# Patient Record
Sex: Male | Born: 1940 | Race: White | Hispanic: No | Marital: Married | Smoking: Former smoker
Health system: Southern US, Community
[De-identification: ages and names within clinical notes are randomized; demographics above are authoritative.]

## PROBLEM LIST (undated history)

## (undated) DIAGNOSIS — R3912 Poor urinary stream: Secondary | ICD-10-CM

## (undated) DIAGNOSIS — K222 Esophageal obstruction: Secondary | ICD-10-CM

## (undated) DIAGNOSIS — I701 Atherosclerosis of renal artery: Secondary | ICD-10-CM

## (undated) DIAGNOSIS — I517 Cardiomegaly: Secondary | ICD-10-CM

## (undated) DIAGNOSIS — R42 Dizziness and giddiness: Secondary | ICD-10-CM

## (undated) DIAGNOSIS — Y842 Radiological procedure and radiotherapy as the cause of abnormal reaction of the patient, or of later complication, without mention of misadventure at the time of the procedure: Secondary | ICD-10-CM

## (undated) DIAGNOSIS — E785 Hyperlipidemia, unspecified: Secondary | ICD-10-CM

## (undated) DIAGNOSIS — I951 Orthostatic hypotension: Secondary | ICD-10-CM

## (undated) DIAGNOSIS — N320 Bladder-neck obstruction: Secondary | ICD-10-CM

## (undated) DIAGNOSIS — E890 Postprocedural hypothyroidism: Secondary | ICD-10-CM

## (undated) DIAGNOSIS — I440 Atrioventricular block, first degree: Secondary | ICD-10-CM

## (undated) DIAGNOSIS — N400 Enlarged prostate without lower urinary tract symptoms: Secondary | ICD-10-CM

## (undated) DIAGNOSIS — Z87828 Personal history of other (healed) physical injury and trauma: Secondary | ICD-10-CM

## (undated) DIAGNOSIS — K117 Disturbances of salivary secretion: Secondary | ICD-10-CM

## (undated) DIAGNOSIS — G629 Polyneuropathy, unspecified: Secondary | ICD-10-CM

## (undated) DIAGNOSIS — I350 Nonrheumatic aortic (valve) stenosis: Secondary | ICD-10-CM

## (undated) DIAGNOSIS — Z974 Presence of external hearing-aid: Secondary | ICD-10-CM

## (undated) DIAGNOSIS — M1990 Unspecified osteoarthritis, unspecified site: Secondary | ICD-10-CM

## (undated) DIAGNOSIS — I2510 Atherosclerotic heart disease of native coronary artery without angina pectoris: Secondary | ICD-10-CM

## (undated) DIAGNOSIS — I452 Bifascicular block: Secondary | ICD-10-CM

## (undated) DIAGNOSIS — R3911 Hesitancy of micturition: Secondary | ICD-10-CM

## (undated) DIAGNOSIS — Z86718 Personal history of other venous thrombosis and embolism: Secondary | ICD-10-CM

## (undated) DIAGNOSIS — K219 Gastro-esophageal reflux disease without esophagitis: Secondary | ICD-10-CM

## (undated) DIAGNOSIS — R011 Cardiac murmur, unspecified: Secondary | ICD-10-CM

## (undated) DIAGNOSIS — R3915 Urgency of urination: Secondary | ICD-10-CM

## (undated) DIAGNOSIS — G4700 Insomnia, unspecified: Secondary | ICD-10-CM

## (undated) DIAGNOSIS — I499 Cardiac arrhythmia, unspecified: Secondary | ICD-10-CM

## (undated) DIAGNOSIS — Z923 Personal history of irradiation: Secondary | ICD-10-CM

## (undated) DIAGNOSIS — I700 Atherosclerosis of aorta: Secondary | ICD-10-CM

## (undated) DIAGNOSIS — D892 Hypergammaglobulinemia, unspecified: Secondary | ICD-10-CM

## (undated) DIAGNOSIS — N189 Chronic kidney disease, unspecified: Secondary | ICD-10-CM

## (undated) DIAGNOSIS — C029 Malignant neoplasm of tongue, unspecified: Secondary | ICD-10-CM

## (undated) DIAGNOSIS — Z85819 Personal history of malignant neoplasm of unspecified site of lip, oral cavity, and pharynx: Secondary | ICD-10-CM

## (undated) DIAGNOSIS — D696 Thrombocytopenia, unspecified: Secondary | ICD-10-CM

## (undated) HISTORY — DX: Dizziness and giddiness: R42

## (undated) HISTORY — DX: Polyneuropathy, unspecified: G62.9

## (undated) HISTORY — DX: Chronic kidney disease, unspecified: N18.9

## (undated) HISTORY — DX: Cardiac arrhythmia, unspecified: I49.9

## (undated) HISTORY — DX: Cardiomegaly: I51.7

## (undated) HISTORY — DX: Malignant neoplasm of tongue, unspecified: C02.9

## (undated) HISTORY — DX: Insomnia, unspecified: G47.00

## (undated) HISTORY — PX: TRANSTHORACIC ECHOCARDIOGRAM: SHX275

## (undated) HISTORY — PX: MINIMALLY INVASIVE MAZE PROCEDURE: SHX6244

## (undated) HISTORY — DX: Atherosclerosis of aorta: I70.0

## (undated) HISTORY — DX: Hypergammaglobulinemia, unspecified: D89.2

## (undated) HISTORY — DX: Nonrheumatic aortic (valve) stenosis: I35.0

## (undated) HISTORY — PX: CARDIOVASCULAR STRESS TEST: SHX262

## (undated) HISTORY — PX: CARDIAC CATHETERIZATION: SHX172

## (undated) HISTORY — DX: Thrombocytopenia, unspecified: D69.6

## (undated) HISTORY — DX: Hyperlipidemia, unspecified: E78.5

## (undated) HISTORY — DX: Atherosclerosis of renal artery: I70.1

## (undated) HISTORY — DX: Unspecified osteoarthritis, unspecified site: M19.90

## (undated) HISTORY — PX: CORONARY ARTERY BYPASS GRAFT: SHX141

## (undated) HISTORY — DX: Personal history of irradiation: Z92.3

---

## 2003-01-08 ENCOUNTER — Encounter: Payer: Self-pay | Admitting: Emergency Medicine

## 2003-01-08 ENCOUNTER — Inpatient Hospital Stay (HOSPITAL_COMMUNITY): Admission: EM | Admit: 2003-01-08 | Discharge: 2003-01-11 | Payer: Self-pay | Admitting: Emergency Medicine

## 2003-03-24 ENCOUNTER — Ambulatory Visit (HOSPITAL_COMMUNITY): Admission: RE | Admit: 2003-03-24 | Discharge: 2003-03-24 | Payer: Self-pay | Admitting: Cardiology

## 2003-04-12 ENCOUNTER — Ambulatory Visit (HOSPITAL_COMMUNITY): Admission: RE | Admit: 2003-04-12 | Discharge: 2003-04-12 | Payer: Self-pay | Admitting: Neurology

## 2003-04-12 ENCOUNTER — Encounter: Payer: Self-pay | Admitting: Neurology

## 2006-01-26 ENCOUNTER — Inpatient Hospital Stay (HOSPITAL_BASED_OUTPATIENT_CLINIC_OR_DEPARTMENT_OTHER): Admission: RE | Admit: 2006-01-26 | Discharge: 2006-01-26 | Payer: Self-pay | Admitting: Cardiology

## 2007-07-27 ENCOUNTER — Emergency Department (HOSPITAL_COMMUNITY): Admission: EM | Admit: 2007-07-27 | Discharge: 2007-07-27 | Payer: Self-pay | Admitting: Emergency Medicine

## 2010-09-05 ENCOUNTER — Ambulatory Visit: Payer: Self-pay | Admitting: Cardiology

## 2011-01-21 ENCOUNTER — Other Ambulatory Visit: Payer: Self-pay | Admitting: *Deleted

## 2011-01-21 DIAGNOSIS — E7800 Pure hypercholesterolemia, unspecified: Secondary | ICD-10-CM
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
## 2011-02-07 NOTE — Cardiovascular Report (Signed)
   NAME:  KELSON, QUEENAN NO.:  [id number]   MEDICAL RECORD NO.:  [id number]                   PATIENT TYPE:  OIB   LOCATION:  2856                                 FACILITY:  [facility]   PHYSICIAN:  Colleen Can. Deborah Chalk, M.D.            DATE OF BIRTH:  March 30, 1941   DATE OF PROCEDURE:  03/24/2003  DATE OF DISCHARGE:                              CARDIAC CATHETERIZATION   PROCEDURE:  Removal of insertable loop recorder under fluoroscopy.   The patient's left subclavicular area was prepped and draped.  The area was  infiltrated with 1% Xylocaine.  Fluoroscopy was used to locate the loop  recorder which was somewhat lateral.  Incision was made and using sharp,  blunt, and Bovie dissection the loop recorder was identified and removed.  The wound was flushed with kanamycin solution.  The wound was then closed  with 2-0 and subsequent 5-0 Dexon and Steri-Strips were applied.  The  patient tolerated the procedure well.  Valium 10 mg p.o. was given prior to  the procedure as well as 2 mg of Versed during the procedure for sedation.                                                 Colleen Can. Deborah Chalk, M.D.    SNT/MEDQ  D:  03/24/2003  T:  03/24/2003  Job:  [id number]
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
## 2011-02-07 NOTE — Cardiovascular Report (Signed)
NAME:  George Bradley, George Bradley NO.:  [id number]   MEDICAL RECORD NO.:  [id number]          PATIENT TYPE:  OIB   LOCATION:  1961                         FACILITY:  [facility]   PHYSICIAN:  Colleen Can. Deborah Chalk, M.D.DATE OF BIRTH:  23-May-1941   DATE OF PROCEDURE:  01/26/2006  DATE OF DISCHARGE:  01/26/2006                              CARDIAC CATHETERIZATION   HISTORY:  Mr. Brindle has had previous coronary artery bypass surgery in 2000.  He has had arrhythmia and hypotension.  He is referred for evaluation  because of weakness, fatigue and an abnormal stress Cardiolite study which  suggested a decreased global ejection fraction.   PROCEDURE:  Left heart catheterization, selective coronary angiography,  saphenous vein graft angiography x3, angiography left internal mammary  artery.   TYPE AND SITE OF ENTRY:  Percutaneous right femoral artery.   CATHETERS:  4-French #4 curved Judkins right and left coronary catheter, 4-  French pigtail ventriculographic catheter, 4-French left bypass graft  catheter.   CONTRAST MATERIAL:  Omnipaque.   MEDICATIONS:  Given prior procedure, Valium 10 mg p.o.   MEDICATIONS:  Given during the procedure, Versed 2 mg IV.   COMMENTS:  The patient tolerated the procedure well.   HEMODYNAMIC DATA:  The aortic pressure is 106/56, LV is 124/1-8.  There is  no aortic valve gradient noted on pullback.   ANGIOGRAPHIC DATA:  1.  Left left main coronary artery:  Left main coronary artery has a slight      10-20% taper.  Otherwise it is free of significant disease.  2.  Left anterior descending:  Left anterior descending is totally occluded      at the ostium.  There is very small diagonal branch that arises at that      point as well as a larger diagonal vessel.  The earlier diagonal vessel      has a 90% ostial narrowing.  There is competitive flow distally in this      diagonal from the patent vein graft.  3.  Left circumflex:  Left circumflex is a  moderate-sized vessel.  It is      heavily calcified.  In the midsection, there is a 90+ percent stenosis      and somewhat diffuse disease within the confines of the more proximal      portions of the left circumflex.  There is competitive flow distally      from the patent distal bypass graft insertion.  4.  Right coronary artery:  Right coronary artery is totally occluded just      beyond acute marginal branch.  There is a 90-95% short segmental      stenosis in the proximal portions.  This acute marginal vessel that      arises is a small vessel.  This has an area of potential ischemia but it      is a relatively small acute marginal branch.  5.  Saphenous vein graft to the right coronary artery is widely patent with      a nice insertion and good distal runoff.  6.  Saphenous vein graft to the diagonal vessel is widely patent with a nice      insertion and good distal runoff.  7.  Saphenous vein graft to the obtuse marginal is widely patent with a nice      insertion and good distal runoff.  8.  Left internal mammary graft to the left anterior descending is widely      patent with a nice insertion and good distal runoff.   Left ventricular angiogram was performed in RAO position.  Overall cardiac  size and silhouette are normal.  There is very minimal anterior hypokinesis.  The global ejection fraction would be estimated be 55%.  There was 1+ mitral  regurgitation noted.   OVERALL IMPRESSION:  1.  Severe three-vessel coronary disease.  2.  Patent saphenous vein grafts x3 with patent left internal mammary graft.  3.  Reasonably well-preserved left ventricular function with mild anterior      hypokinesis.   DISCUSSION:  Overall, it is felt that Mr. Latner is relatively stable.  It is  felt that his heart is satisfactorily revascularized at this point and time.  There is the slight issue with the proximal right coronary artery and an  acute marginal vessel but it was felt that is  probably best treated  medically.  We will get a 2-D echocardiogram to assess an underlying mitral  regurgitation to look for possible other etiologies of symptoms.  Overall  prognosis should be good.      Colleen Can. Deborah Chalk, M.D.  Electronically Signed     SNT/MEDQ  D:  01/26/2006  T:  01/26/2006  Job:  [id number]
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
## 2011-02-20 ENCOUNTER — Ambulatory Visit (INDEPENDENT_AMBULATORY_CARE_PROVIDER_SITE_OTHER): Payer: Medicare Other | Admitting: Cardiology

## 2011-02-20 ENCOUNTER — Encounter: Payer: Self-pay | Admitting: *Deleted

## 2011-02-20 ENCOUNTER — Other Ambulatory Visit (INDEPENDENT_AMBULATORY_CARE_PROVIDER_SITE_OTHER): Payer: Medicare Other | Admitting: *Deleted

## 2011-02-20 ENCOUNTER — Encounter: Payer: Self-pay | Admitting: Cardiology

## 2011-02-20 VITALS — BP 122/74 | HR 60 | Ht 73.0 in | Wt 193.4 lb

## 2011-02-20 DIAGNOSIS — I2510 Atherosclerotic heart disease of native coronary artery without angina pectoris: Secondary | ICD-10-CM

## 2011-02-20 DIAGNOSIS — E7800 Pure hypercholesterolemia, unspecified: Secondary | ICD-10-CM

## 2011-02-20 DIAGNOSIS — I259 Chronic ischemic heart disease, unspecified: Secondary | ICD-10-CM

## 2011-02-20 DIAGNOSIS — I209 Angina pectoris, unspecified: Secondary | ICD-10-CM | POA: Insufficient documentation

## 2011-02-20 DIAGNOSIS — I359 Nonrheumatic aortic valve disorder, unspecified: Secondary | ICD-10-CM

## 2011-02-20 DIAGNOSIS — I350 Nonrheumatic aortic (valve) stenosis: Secondary | ICD-10-CM | POA: Insufficient documentation

## 2011-02-20 LAB — BASIC METABOLIC PANEL
BUN: 15 mg/dL (ref 6–23)
CO2: 23 mEq/L (ref 19–32)
Calcium: 9.5 mg/dL (ref 8.4–10.5)
Creatinine, Ser: 1.2 mg/dL (ref 0.4–1.5)
GFR: 63.58 mL/min (ref >60.00)
Glucose, Bld: 101 mg/dL — ABNORMAL HIGH (ref 70–99)
Potassium: 4.4 mEq/L (ref 3.5–5.1)

## 2011-02-20 LAB — HEPATIC FUNCTION PANEL
AST: 26 U/L (ref 0–37)
Total Bilirubin: 0.7 mg/dL (ref 0.3–1.2)

## 2011-02-20 LAB — LIPID PANEL
LDL Cholesterol: 89 mg/dL (ref 0–99)
Total CHOL/HDL Ratio: 3
Triglycerides: 101 mg/dL (ref 0.0–149.0)

## 2011-02-20 NOTE — Assessment & Plan Note (Signed)
Will arrange for him to have a stress Cardiolite study.

## 2011-02-20 NOTE — Assessment & Plan Note (Signed)
His aortic stenosis is mild and his exam today is consistent with nonsignificant aortic stenosis.

## 2011-02-20 NOTE — Progress Notes (Signed)
Subjective:   Mr. Abshier comes in today for followup visit. His last stress Cardiolite study was in June of 2010 and we will repeat after this visit. Overall, he still complains of reduced energy level is able to stay quite active around his house. He does report some intermittent tingling of his lower extremities simultaneously and I suggested that he consider evaluation for spinal stenosis by primary care. Overall, he is doing well.  He had coronary artery bypass grafting in 2000. His last catheterization was in 2007 and showed patent saphenous vein grafts x3 with a patent left internal mammary artery and well-preserved LV function. His EF is 55%.  He a tendency toward hypotension and some lightheadedness on occasion. He is not on an ACE inhibitor or beta blocker. He's had a history of what sounds like a Maze procedure when he lived in [place]. His other problems include hyperlipidemia, insomnia, and a history of depression.  Current Outpatient Prescriptions  Medication Sig Dispense Refill  . aspirin 81 MG tablet Take 81 mg by mouth daily.        . rosuvastatin (CRESTOR) 20 MG tablet Take 20 mg by mouth daily.        Marland Kitchen zolpidem (AMBIEN CR) 6.25 MG CR tablet Take 6.25 mg by mouth at bedtime as needed.        Marland Kitchen DISCONTD: ezetimibe (ZETIA) 10 MG tablet Take 10 mg by mouth daily.          No Known Allergies  There is no problem list on file for this patient.   History  Smoking status  . Never Smoker   Smokeless tobacco  . Never Used    History  Alcohol Use No    Family History  Problem Relation Age of Onset  . Heart disease Father   . Heart disease Brother   . Heart disease Sister     Review of Systems:   The patient denies any heat or cold intolerance.  No weight gain or weight loss.  The patient denies headaches or blurry vision.  There is no cough or sputum production.  The patient denies dizziness.  There is no hematuria or hematochezia.  The patient denies any muscle  aches or arthritis.  The patient denies any rash.  The patient denies frequent falling or instability.  There is no history of depression or anxiety.  All other systems were reviewed and are negative.   Physical Exam:    Blood pressure 120/74. Heart rate 60 and regular. Weight is 193 pounds.  The head is normocephalic and atraumatic.  Pupils are equally round and reactive to light.  Sclerae nonicteric.  Conjunctiva is clear.  Oropharynx is unremarkable.  There's adequate oral airway.  Neck is supple there are no masses.  Thyroid is not enlarged.  There is no lymphadenopathy.  Lungs are clear.  Chest is symmetric.  Heart shows a regular rate and rhythm.  S1 and S2 are normal.  There is a soft systolic outflow murmur.  Abdomen is soft normal bowel sounds.  There is no organomegaly.  Genital and rectal deferred.  Extremities are without edema.  Peripheral pulses are adequate.  Neurologically intact.he is hyperreflexic in the lower extremities has adequate pedal pulses.  Full range of motion.  The patient is not depressed.  Skin is warm and dry.  Assessment / Plan:

## 2011-02-20 NOTE — Assessment & Plan Note (Signed)
We'll check lab work. We'll continue Crestor 20 mg per day.

## 2011-02-21 ENCOUNTER — Telehealth: Payer: Self-pay | Admitting: *Deleted

## 2011-02-21 NOTE — Telephone Encounter (Signed)
Message copied by Adolphus Birchwood on Fri Feb 21, 2011 11:43 AM ------      Message from: Roger Shelter      Created: Thu Feb 20, 2011  1:50 PM       Ok, recheck in six months.

## 2011-02-21 NOTE — Telephone Encounter (Signed)
Pt notified of lab results and informed to have labs in six months.

## 2011-03-03 ENCOUNTER — Ambulatory Visit (HOSPITAL_COMMUNITY): Payer: Medicare Other | Attending: Cardiology | Admitting: Radiology

## 2011-03-03 VITALS — Ht 73.0 in | Wt 191.0 lb

## 2011-03-03 DIAGNOSIS — I4949 Other premature depolarization: Secondary | ICD-10-CM

## 2011-03-03 DIAGNOSIS — I2510 Atherosclerotic heart disease of native coronary artery without angina pectoris: Secondary | ICD-10-CM | POA: Insufficient documentation

## 2011-03-03 DIAGNOSIS — R0989 Other specified symptoms and signs involving the circulatory and respiratory systems: Secondary | ICD-10-CM

## 2011-03-03 DIAGNOSIS — I25810 Atherosclerosis of coronary artery bypass graft(s) without angina pectoris: Secondary | ICD-10-CM

## 2011-03-03 MED ORDER — TECHNETIUM TC 99M TETROFOSMIN IV KIT
33.0000 | PACK | Freq: Once | INTRAVENOUS | Status: AC | PRN
  Administered 2011-03-03: 33 via INTRAVENOUS

## 2011-03-03 MED ORDER — TECHNETIUM TC 99M TETROFOSMIN IV KIT
11.0000 | PACK | Freq: Once | INTRAVENOUS | Status: AC | PRN
  Administered 2011-03-03: 11 via INTRAVENOUS

## 2011-03-03 NOTE — Progress Notes (Signed)
[facility] SITE 3 NUCLEAR MED [address] [place] [address] [phone]  Cardiology Nuclear Med Study  ZADEN SAKO is a 70 y.o. male [id number] 10/20/1940   Nuclear Med Background Indication for Stress Test:  Evaluation for Ischemia and Graft Patency History:  '00 CABG, '07 Cath:patient grafts, '10 Echo:normal LVF, mild AS, '10 OZH:YQMVHQIO scar, no ischemia, EF=57% Cardiac Risk Factors: Family History - CAD, Lipids and RBBB  Symptoms:  DOE, Fatigue with Exertion, Light-Headedness and Rapid HR   Nuclear Pre-Procedure Caffeine/Decaff Intake:  None NPO After: 7:00pm   Lungs:  Clear. IV 0.9% NS with Angio Cath:  18g  IV Site: R Antecubital  IV Started by:  Stanton Kidney, EMT-P  Chest Size (in):  44 Cup Size: n/a  Height: 6\' 1"  (1.854 m)  Weight:  191 lb (86.637 kg)  BMI:  Body mass index is 25.20 kg/(m^2). Tech Comments:  NA    Nuclear Med Study 1 or 2 day study: 1 day  Stress Test Type:  Stress  Reading MD: Charlton Haws, MD  Order Authorizing Provider:  Roger Shelter, MD  Resting Radionuclide: Technetium 56m Tetrofosmin  Resting Radionuclide Dose: 11 mCi   Stress Radionuclide:  Technetium 13m Tetrofosmin  Stress Radionuclide Dose: 33 mCi           Stress Protocol Rest HR: 45 Stress HR: 151  Rest BP: 107/72 Stress BP: 169/70  Exercise Time (min): 11:46 METS: 13.4   Predicted Max HR: 150 bpm % Max HR: 100.67 bpm Rate Pressure Product: 96295   Dose of Adenosine (mg):  n/a Dose of Lexiscan: n/a mg  Dose of Atropine (mg): n/a Dose of Dobutamine: n/a mcg/kg/min (at max HR)  Stress Test Technologist: Smiley Houseman, CMA-N  Nuclear Technologist:  Doyne Keel, CNMT     Rest Procedure:  Myocardial perfusion imaging was performed at rest 45 minutes following the intravenous administration of Technetium 53m Tetrofosmin.  Rest ECG: PVC's, RBBB, LAFB  Stress Procedure:  The patient exercised for 11:46 on the treadmill utilizing the Bruce protocol.   The patient stopped due to fatigue and denied any chest pain.  There were no significant ST-T wave changes.  He did have frequent PVC's with couplets and runs of nonsustained v-tach at peak exercise.  Technetium 28m Tetrofosmin was injected at peak exercise and myocardial perfusion imaging was performed after a brief delay.  Stress ECG: No significant change from baseline ECG  QPS Raw Data Images:  Normal; no motion artifact; normal heart/lung ratio. Stress Images:  There is decreased uptake in the anterior wall. Rest Images:  There is decreased uptake in the anterior wall. Subtraction (SDS):  There is a fixed defect that is most consistent with a previous infarction. Transient Ischemic Dilatation (Normal <1.22):  .95 Lung/Heart Ratio (Normal <0.45):  .25  Quantitative Gated Spect Images QGS EDV:  123 ml QGS ESV:  56 ml QGS cine images:  EF in normal range but apex appears hypokinetic QGS EF: 55%  Impression Exercise Capacity:  Excellent exercise capacity. BP Response:  Normal blood pressure response. Clinical Symptoms:  There is dyspnea. ECG Impression:  No significant ST segment change suggestive of ischemia. 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  RV appears prominant     Charlton Haws

## 2011-03-04 NOTE — Progress Notes (Signed)
Copy routed to Dr. Tennant.Falecha L Clark °  °

## 2011-03-07 ENCOUNTER — Telehealth: Payer: Self-pay | Admitting: *Deleted

## 2011-03-07 NOTE — Progress Notes (Signed)
No ischemia

## 2011-03-07 NOTE — Telephone Encounter (Signed)
Pt notified of nuclear results.   

## 2011-03-17 ENCOUNTER — Other Ambulatory Visit: Payer: Self-pay | Admitting: Internal Medicine

## 2011-03-17 DIAGNOSIS — R599 Enlarged lymph nodes, unspecified: Secondary | ICD-10-CM

## 2011-03-20 ENCOUNTER — Ambulatory Visit
Admission: RE | Admit: 2011-03-20 | Discharge: 2011-03-20 | Disposition: A | Discharge status: Home / Self Care | Payer: Medicare Other | Source: Ambulatory Visit | Attending: Internal Medicine | Admitting: Internal Medicine

## 2011-03-20 DIAGNOSIS — R599 Enlarged lymph nodes, unspecified: Secondary | ICD-10-CM

## 2011-03-20 MED ORDER — IOHEXOL 300 MG/ML  SOLN
75.0000 mL | Freq: Once | INTRAMUSCULAR | Status: AC | PRN
  Administered 2011-03-20: 75 mL via INTRAVENOUS

## 2011-03-24 ENCOUNTER — Other Ambulatory Visit (HOSPITAL_COMMUNITY)
Admission: RE | Admit: 2011-03-24 | Discharge: 2011-03-24 | Disposition: A | Discharge status: Home / Self Care | Payer: Medicare Other | Source: Ambulatory Visit | Attending: Otolaryngology | Admitting: Otolaryngology

## 2011-03-24 ENCOUNTER — Other Ambulatory Visit: Payer: Self-pay | Admitting: Otolaryngology

## 2011-03-24 DIAGNOSIS — R221 Localized swelling, mass and lump, neck: Secondary | ICD-10-CM | POA: Insufficient documentation

## 2011-03-24 DIAGNOSIS — R220 Localized swelling, mass and lump, head: Secondary | ICD-10-CM | POA: Insufficient documentation

## 2011-04-03 ENCOUNTER — Other Ambulatory Visit (HOSPITAL_COMMUNITY): Payer: Self-pay | Admitting: Otolaryngology

## 2011-04-03 DIAGNOSIS — IMO0002 Reserved for concepts with insufficient information to code with codable children: Secondary | ICD-10-CM

## 2011-04-07 ENCOUNTER — Encounter (HOSPITAL_COMMUNITY): Payer: Self-pay

## 2011-04-07 ENCOUNTER — Encounter (HOSPITAL_COMMUNITY)
Admission: RE | Admit: 2011-04-07 | Discharge: 2011-04-07 | Disposition: A | Discharge status: Home / Self Care | Payer: Medicare Other | Source: Ambulatory Visit | Attending: Otolaryngology | Admitting: Otolaryngology

## 2011-04-07 DIAGNOSIS — R220 Localized swelling, mass and lump, head: Secondary | ICD-10-CM | POA: Insufficient documentation

## 2011-04-07 DIAGNOSIS — C770 Secondary and unspecified malignant neoplasm of lymph nodes of head, face and neck: Secondary | ICD-10-CM | POA: Insufficient documentation

## 2011-04-07 DIAGNOSIS — IMO0002 Reserved for concepts with insufficient information to code with codable children: Secondary | ICD-10-CM

## 2011-04-07 DIAGNOSIS — C801 Malignant (primary) neoplasm, unspecified: Secondary | ICD-10-CM | POA: Insufficient documentation

## 2011-04-07 LAB — GLUCOSE, CAPILLARY: Glucose-Capillary: 109 mg/dL — ABNORMAL HIGH (ref 70–99)

## 2011-04-07 MED ORDER — FLUDEOXYGLUCOSE F - 18 (FDG) INJECTION: 19.0000 | Freq: Once | INTRAVENOUS | Status: AC | PRN

## 2011-04-15 ENCOUNTER — Encounter (HOSPITAL_COMMUNITY): Payer: Medicare Other | Attending: Oncology | Admitting: Oncology

## 2011-04-15 ENCOUNTER — Encounter (HOSPITAL_COMMUNITY): Payer: Self-pay | Admitting: Oncology

## 2011-04-15 VITALS — BP 104/68 | HR 61 | Temp 97.7°F | Ht 72.5 in | Wt 197.4 lb

## 2011-04-15 DIAGNOSIS — C109 Malignant neoplasm of oropharynx, unspecified: Secondary | ICD-10-CM | POA: Insufficient documentation

## 2011-04-15 DIAGNOSIS — C50919 Malignant neoplasm of unspecified site of unspecified female breast: Secondary | ICD-10-CM

## 2011-04-15 DIAGNOSIS — C01 Malignant neoplasm of base of tongue: Secondary | ICD-10-CM

## 2011-04-15 DIAGNOSIS — C770 Secondary and unspecified malignant neoplasm of lymph nodes of head, face and neck: Secondary | ICD-10-CM | POA: Insufficient documentation

## 2011-04-15 DIAGNOSIS — R978 Other abnormal tumor markers: Secondary | ICD-10-CM | POA: Insufficient documentation

## 2011-04-15 DIAGNOSIS — C029 Malignant neoplasm of tongue, unspecified: Secondary | ICD-10-CM

## 2011-04-15 LAB — CBC
Hemoglobin: 15.8 g/dL (ref 13.0–17.0)
MCHC: 34.6 g/dL (ref 30.0–36.0)
RBC: 4.91 MIL/uL (ref 4.22–5.81)

## 2011-04-15 LAB — COMPREHENSIVE METABOLIC PANEL
ALT: 18 U/L (ref 0–53)
Albumin: 4.4 g/dL (ref 3.5–5.2)
Alkaline Phosphatase: 36 U/L — ABNORMAL LOW (ref 39–117)
BUN: 19 mg/dL (ref 6–23)
Chloride: 105 mEq/L (ref 96–112)
Potassium: 4.8 mEq/L (ref 3.5–5.1)
Total Bilirubin: 0.2 mg/dL — ABNORMAL LOW (ref 0.3–1.2)

## 2011-04-15 LAB — DIFFERENTIAL
Basophils Relative: 0 % (ref 0–1)
Lymphs Abs: 3.1 10*3/uL (ref 0.7–4.0)
Monocytes Relative: 11 % (ref 3–12)
Neutro Abs: 4.1 10*3/uL (ref 1.7–7.7)
Neutrophils Relative %: 49 % (ref 43–77)

## 2011-04-15 NOTE — Progress Notes (Signed)
This office note has been dictated.

## 2011-04-15 NOTE — Patient Instructions (Addendum)
[facility]  Discharge Instructions   SPECIAL INSTRUCTIONS/FOLLOW-UP: You will be referred to Dr. Karilyn Cota (GI doctor) and to Dr. Kristin Bruins (Oncologic Dentistry).  We will send the referral for you and they will call you to schedule an appointment.  Labs drawn today:  CBC, CMET, CEA.       I acknowledge that I have been informed and understand all the instructions given to me and received a copy. I do not have any more questions at this time, but understand that I may call the Specialty Clinic at [facility] at [phone] during business hours should I have any further questions or need assistance in obtaining follow-up care.    __________________________________________  _____________  __________ Signature of Patient or Authorized Representative            Date                   Time    __________________________________________ Nurse's Signature

## 2011-04-15 NOTE — Progress Notes (Signed)
CC:   Gwen Pounds, MD Lionel December, M.D. Suzanna Obey, M.D. Cindra Eves, D.D.S. Billie Lade, Ph.D., M.D.  DIAGNOSIS: 1. 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  His PET scan does not     reveal distant metastatic disease. 2. History of smoking cigars perhaps once a day for 12 years. 3. History of drinking a glass of wine perhaps once a week. 4. History of right hand trauma as a young child with repair. 5. 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 6. History of hypercholesterolemia on Crestor. 7. History of quadruple bypass surgery in [place], [place] in the year     2000. 8. 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 basis.  This is a very pleasant gentleman who about 3-4 months ago noticed a lump in his right neck which has progressively grown.  He noticed it first while shaving, but interestingly about 3 months prior to that, he noticed a decrease in his energy level.  His strength was dwindling.  He was not able to run his 5 miles in an hour.  He states that just 1 day he could not run more than 15 minutes and he usually ran an hour.  His appetite is still good.  He has not lost weight.  No fevers or night sweats, etc.  REVIEW OF SYSTEMS:  He states that the left side of the tongue is starting to become a little sore but not the right.  So, he was referred by Dr. Timothy Lasso after showing this mass in the right neck to Dr. Timothy Lasso to Dr. Suzanna Obey who did not find a primary in the head and neck examination that he performed.  An aspiration of this, however, revealed the above-mentioned diagnosis, and that was done on 03/24/2011.  Dr. Frederica Kuster read this material.  PAST MEDICAL HISTORY:  As mentioned above.  SOCIAL HISTORY:  He has been  married to his wife, [name], for 14 years. He has 2 children by a previous marriage, a son and a daughter.  He states his son has hypercholesterolemia as well.  His parents are both deceased.  Father died in his late 55s of complications of vascular disease.  His mother lived to be approximately 47 and died of breast cancer, he states.  He has a sister who died at age 88 from breast cancer.  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  PHYSICAL EXAMINATION:  Vital Signs:  Shows that his height is 6 feet 1/2 inch.  His weight is 197 pounds.  His temperature is normal, respirations 16 and unlabored, pulse 60 and regular, blood pressure 104/68 in the right arm sitting position.  He denies any pain at this time.  Lymph Node Exam:  Reveals a 4 x 3 cm mass in the right neck below the jaw in the upper mid neck.  It is very firm and very indurated. There is a smaller node that is 1 cm that is soft but more posteriorly located close to the occipital area.  I did not detect  any nodes in the supraclavicular, infraclavicular, left side of the neck, axillary areas bilaterally or inguinal areas bilaterally.  Abdomen:  He has no hepatosplenomegaly.  Bowel sounds are normal.  Heart:  He has a midline scar on his chest with keloid formation, but his heart shows a very soft S1 and S2 without murmur, rub or gallop.  Lungs:  Clear to auscultation and percussion.  Extremities:  His right hand shows a scar at the base of the thumb where he cut it many years ago and it was repaired, and the Dupuytren contractures were mentioned.  He has no arm or leg edema. Nails are unremarkable.  HEENT:  His teeth are in good repair.  Throat is clear.  Tongue is normal in the midline.  I did not appreciate a lesion.  His pupils are equally round and reactive to light.  Facial symmetry appears intact.  His neck  symmetry, however, is very different with a mass on the right which is very obvious to the eye.  His pulses in his feet are 1+ to 2+ and symmetrical.  He is right-handed.  Strength is symmetrical.  This gentleman appears to have stage III cancer of the head and neck which on PET scan looks like it originated at the base of the tongue on the right.  That has not been biopsied, but I have discussed his case with Dr. Roselind Messier who will see him in consultation in the very near future.  He may ask for a biopsy of the base of the tongue.  He is to see Dr. Jearld Fenton in followup anyway this Friday.  We need to get him to see Dr. Dian Queen.  We need to get him to see Dr. Karilyn Cota about a G-tube during his therapy which should consist of radiation and concomitant chemotherapy.  He will get some baseline labs today, namely CBC, diff, CMET and CEA.  We will see him in followup.  We will probably get Interventional Radiology to place a Port-A-Cath for the weekly cisplatin which I have discussed with him and his wife in detail.  We will see him back once we get all these consultations and we get a date for the initiation of radiation therapy, etc.  We plan to give him cisplatin weekly at 40 mg/sq m for the duration of the radiation.    ______________________________ Ladona Horns. Mariel Sleet, MD ESN/MEDQ  D:  04/15/2011  T:  04/15/2011  Job:  [id number]

## 2011-04-16 ENCOUNTER — Other Ambulatory Visit (HOSPITAL_COMMUNITY): Payer: Self-pay | Admitting: Oncology

## 2011-04-16 LAB — CEA: CEA: 63.3 ng/mL — ABNORMAL HIGH (ref 0.0–5.0)

## 2011-04-17 ENCOUNTER — Encounter (INDEPENDENT_AMBULATORY_CARE_PROVIDER_SITE_OTHER): Payer: Self-pay

## 2011-04-17 ENCOUNTER — Other Ambulatory Visit (HOSPITAL_COMMUNITY): Payer: Medicare Other | Admitting: Dentistry

## 2011-04-17 DIAGNOSIS — I2510 Atherosclerotic heart disease of native coronary artery without angina pectoris: Secondary | ICD-10-CM

## 2011-04-17 DIAGNOSIS — C760 Malignant neoplasm of head, face and neck: Secondary | ICD-10-CM

## 2011-04-17 DIAGNOSIS — Z0189 Encounter for other specified special examinations: Secondary | ICD-10-CM

## 2011-04-18 ENCOUNTER — Other Ambulatory Visit (HOSPITAL_COMMUNITY): Payer: Self-pay | Admitting: Oncology

## 2011-04-18 DIAGNOSIS — C029 Malignant neoplasm of tongue, unspecified: Secondary | ICD-10-CM

## 2011-04-22 ENCOUNTER — Encounter (INDEPENDENT_AMBULATORY_CARE_PROVIDER_SITE_OTHER): Payer: Self-pay | Admitting: Internal Medicine

## 2011-04-22 ENCOUNTER — Ambulatory Visit (INDEPENDENT_AMBULATORY_CARE_PROVIDER_SITE_OTHER): Payer: Medicare Other | Admitting: Internal Medicine

## 2011-04-22 VITALS — BP 122/64 | HR 72 | Temp 97.5°F | Ht 72.0 in | Wt 196.8 lb

## 2011-04-22 DIAGNOSIS — C029 Malignant neoplasm of tongue, unspecified: Secondary | ICD-10-CM

## 2011-04-22 NOTE — Progress Notes (Signed)
EGD w/ peg placement sch'd 04/24/11 @ 8:30 (7:30), inst given to patient

## 2011-04-22 NOTE — Progress Notes (Signed)
Subjective:     Patient ID: George Bradley, male   DOB: 1941/05/12, 70 y.o.   MRN: [id number]  HPI  Referral form Dr. Mariel Sleet for evaluation of placement of feeding tube. He has hx of tongue cancer and will be starting radiation therapy hopefully the beginning of next week. Hx Stage 3 Squamous cell carcinoma of the base of the tongue metastatic to rt cervical lymph nodes. His PET scan did not reveal distant metastatic disease.  Per Dr. Mariel Sleet dictated notes Mr. Pewitt noticed a lump to the rt side of his neck 3-4 months ago which had progressively grown. He noticed fatigue and decreased energy. Wife is in room. Appetite is good. No weight loss. No pain or tongue pain. No dysphagia.  He has positive attitude.   03/18/2011 Labs: WBFC 5.70, H and H 16.5 an d47.3, MCV 97.3, NA 141 , K 4.5, Choride 106, Calcium 9.6,  Review of Systems  See hpi     Objective:   Physical Exam  Alert and oriented. Skin warm and dry. Oral mucosa is moist. Natural teeth in good condition. Sclera anicteric, conjunctivae is pink. Thyroid not enlarged. + Cervical lymphadenopathy rt side of neck. Lungs clear. Heart regular rate and rhythm.  Abdomen is soft. Bowel sounds are positive. No hepatomegaly. No abdominal masses felt. No tenderness.  No edema to lower extremities. Patient is alert and oriented.      Assessment:   Recent hx of tongue cancer. Patient will be starting radiation therapy in the next few weeks.  Will need PEG tube placement    Plan:     Patient is scheduled for a PEG Tube for Thursday.  Dr. Karilyn Cota was also in the room during discharge.

## 2011-04-23 ENCOUNTER — Ambulatory Visit
Admission: RE | Admit: 2011-04-23 | Discharge: 2011-04-23 | Disposition: A | Discharge status: Home / Self Care | Payer: Medicare Other | Source: Ambulatory Visit | Attending: Radiation Oncology | Admitting: Radiation Oncology

## 2011-04-23 DIAGNOSIS — M25519 Pain in unspecified shoulder: Secondary | ICD-10-CM | POA: Insufficient documentation

## 2011-04-23 DIAGNOSIS — C01 Malignant neoplasm of base of tongue: Secondary | ICD-10-CM | POA: Insufficient documentation

## 2011-04-23 DIAGNOSIS — I2510 Atherosclerotic heart disease of native coronary artery without angina pectoris: Secondary | ICD-10-CM | POA: Insufficient documentation

## 2011-04-23 DIAGNOSIS — Z803 Family history of malignant neoplasm of breast: Secondary | ICD-10-CM | POA: Insufficient documentation

## 2011-04-23 DIAGNOSIS — Z510 Encounter for antineoplastic radiation therapy: Secondary | ICD-10-CM | POA: Insufficient documentation

## 2011-04-23 DIAGNOSIS — Z951 Presence of aortocoronary bypass graft: Secondary | ICD-10-CM | POA: Insufficient documentation

## 2011-04-23 MED ORDER — SODIUM CHLORIDE 0.45 % IV SOLN
Freq: Once | INTRAVENOUS | Status: AC
  Administered 2011-04-24: 08:00:00 via INTRAVENOUS

## 2011-04-23 NOTE — Progress Notes (Signed)
CC:   George Bradley. Mariel Sleet, MD Suzanna Obey, M.D. Billie Lade, Ph.D., M.D. Denton Lank, DDS  Guy Sandifer  Widen is a 70 year old male referred by Dr. Glenford Peers for a dental consultation.  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  The patient is now seen as part of pre-chemoradiation therapy dental protocol evaluation.  MEDICAL HISTORY: 1. 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     a.     Status post fine-needle aspirate of the right neck mass      which was positive for squamous cell carcinoma.  This was      performed by Dr. Suzanna Obey on 03/24/2011.  The patient      subsequently had a PET scan on 04/07/2011 which revealed probable      right base of tongue primary.  No definitive biopsy has been taken      at this time.     b.     Anticipated radiation therapy with Dr. Roselind Messier.     c.     Anticipated chemotherapy with Dr. Mariel Sleet utilizing cis-      platinum on a weekly basis. 2. Coronary artery disease.     a.     Status post coronary artery bypass graft x4 in [place], [place]      in May 2000.     b.     Patient currently followed by Dr. Delfin Edis who has      recently retired.     c.     Status post cardiac catheterization in May 2007 which      revealed three-vessel coronary artery disease with patent      saphenous vein graft x3 and a patent left internal mammary graft      as well.  Patient with preserved left ventricular function with      ejection fraction approximately 55%.  The patient had been      undergoing active medical treatment after that catheterization. 3. History of a hypercholesterolemia currently on Crestor. 4. 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 5. 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 6. History of cardiac  arrhythmias status post multiple ablation     therapies.  ALLERGIES:  None known.  MEDICATIONS: 1. Aspirin 81 mg daily. 2. Crestor 20 mg daily. 3. Celebrex 200 mg daily. 4. Ambien 10 mg 1/2 tablet at bedtime. 5. Non-aspirin PM 1 at bedtime.  SOCIAL HISTORY:  Patient is currently married to his wife [name] for the past 14 years.  The patient has 2 children with his previous wife. The patient has 1 son and 1 daughter.  The patient is currently retired. The patient occasionally smokes a cigar but very rarely.  The patient has an occasional glass of wine.  FAMILY HISTORY:  Mother died at the age of 72 with a history of breast cancer and Alzheimer's disease.  Father died in his late 59s secondary to cardiovascular disease.  FUNCTIONAL ASSESSMENT:  The patient remains independent for ADLs at this time.  REVIEW OF SYSTEMS:  This is reviewed with the patient and is included in his dental consultation record.  DENTAL HISTORY   CHIEF COMPLAINT:  The patient needs a pre-chemoradiation therapy dental evaluation.  HISTORY OF PRESENT ILLNESS:  Patient  recently diagnosed with cancer involving the right neck mass.  Patient with probable squamous cell carcinoma of the right base of tongue.  Patient with anticipated chemoradiation therapy.  Patient now seen as part of pre-chemoradiation therapy dental protocol evaluation.  The patient currently denies acute toothache, swellings or abscesses. The patient was last seen for an exam and cleaning in February 2012 by Dr. Lamont Dowdy in [place], [place].  The patient is seen on an every 19-month basis and has been seen over the past 8+ years.  The patient had been being evaluated for possible crown therapies over the past 8 years.  DENTAL EXAM  General:  The patient is a well-developed, well-nourished male in no acute distress. Vital Signs:  Blood pressure is 100/62, pulse rate is 52, temperature is 97.1.  Head and Neck Exam:   Patient with significant right neck lymphadenopathy.  I am unable to palpate any left neck lymphadenopathy. The patient denies acute TMJ symptoms. Intraoral exam:  The patient has normal saliva.  I do not see any evidence of abscess formation within the mouth.  Dentition:  The patient is missing tooth numbers 1, 3, 16, 17, 18, 19 and 32.  The patient has a bridge replacing the missing tooth #3. Periodontal:  Patient with chronic periodontitis with minimal plaque or calculus accumulations, selective areas of gingival recession and no significant tooth mobility.  Patient with incipient bone loss at best. Dental Caries:  The patient has incipient dental caries associated with tooth #30 at the gingival margin.  The patient does have several flexure lesions. Endodontic:  The patient currently denies acute pulpitis symptoms.  I do not see any obvious evidence of periapical pathology, although there may be a hint of increased periodontal ligament space at the apex of tooth #4 on one of the periapical views.  The patient currently denies acute pulpitis symptoms, however. Crown or Bridge:  The patient has multiple crown and bridge restorations which appear to be acceptable at this time.  The patient could be evaluated for future crown restorations as indicated. Prosthodontic:  Patient without a history of a lower partial denture. Occlusion:  Patient with a stable occlusion and an acceptable occlusal scheme.  ASSESSMENT: 1. Chronic periodontitis with minimal bone loss. 2. Minimal plaque accumulations. 3. Selective areas of gingival recession. 4. No significant tooth mobility. 5. Multiple missing teeth. 6. Stable occlusion. 7. Small incipient caries at the gingival margin on the buccal aspect     of tooth #30. 8. Slight hint of an increased periodontal ligament space associated     with the apex of tooth #4 to be followed for possible need for root     canal therapy as  indicated.     PLAN/RECOMMENDATION: 1. I discussed the risks, benefits, and complications of various     treatment options with the patient in relationship to his medical     and dental conditions and anticipated chemoradiation therapy,     chemoradiation therapy side effects to include xerostomia,     radiation caries, trismus, mucositis, taste changes, gum and jaw     bone changes, risk for infection, bleeding and osteoradionecrosis.     We discussed various treatment options to include no treatment,     selective extraction of the teeth in the primary field of radiation     therapy, periodontal therapy, dental restorations, root canal     therapy, crown or bridge therapy, implant therapy, replacing     missing teeth as indicated after adequate  healing.  The patient     currently wishes to proceed without any dental extractions at this     time and is aware of the potential risk for osteoradionecrosis.     The patient does agree to maintain exam and cleanings with his     dentist on a 2-3 time a year basis along with the use of fluoride     trays and scatter protection devices as indicated.  Will attempt to     discuss with his primary dentist, Dr. Lamont Dowdy, to ensure that Dr.     Lamont Dowdy will be able to maintain the patient's dentition in     cooperation with the patient. 2. Impressions for the fabrication of fluoride trays and scatter     protection devices - today. 3. Provision of a prescription for fluoride therapy for the patient to     use at bedtime. 4. Discussion of findings with the medical team in coordination of     future chemoradiation therapy as indicated.       ______________________________ Cindra Eves, D.D.S. RK/MEDQ  D:  04/17/2011  T:  04/17/2011  Job:  [id number]

## 2011-04-24 ENCOUNTER — Encounter (HOSPITAL_COMMUNITY)
Admission: RE | Disposition: A | Discharge status: Home / Self Care | Payer: Self-pay | Source: Ambulatory Visit | Attending: Internal Medicine

## 2011-04-24 ENCOUNTER — Other Ambulatory Visit (HOSPITAL_COMMUNITY): Payer: Self-pay | Admitting: Oncology

## 2011-04-24 ENCOUNTER — Ambulatory Visit (HOSPITAL_COMMUNITY)
Admission: RE | Admit: 2011-04-24 | Discharge: 2011-04-24 | Disposition: A | Discharge status: Home / Self Care | Payer: Medicare Other | Source: Ambulatory Visit | Attending: Internal Medicine | Admitting: Internal Medicine

## 2011-04-24 DIAGNOSIS — K319 Disease of stomach and duodenum, unspecified: Secondary | ICD-10-CM

## 2011-04-24 DIAGNOSIS — K208 Other esophagitis without bleeding: Secondary | ICD-10-CM

## 2011-04-24 DIAGNOSIS — Z7982 Long term (current) use of aspirin: Secondary | ICD-10-CM | POA: Insufficient documentation

## 2011-04-24 DIAGNOSIS — K2970 Gastritis, unspecified, without bleeding: Secondary | ICD-10-CM

## 2011-04-24 DIAGNOSIS — D490 Neoplasm of unspecified behavior of digestive system: Secondary | ICD-10-CM

## 2011-04-24 DIAGNOSIS — E7800 Pure hypercholesterolemia, unspecified: Secondary | ICD-10-CM | POA: Insufficient documentation

## 2011-04-24 DIAGNOSIS — K2100 Gastro-esophageal reflux disease with esophagitis, without bleeding: Secondary | ICD-10-CM | POA: Insufficient documentation

## 2011-04-24 DIAGNOSIS — C01 Malignant neoplasm of base of tongue: Secondary | ICD-10-CM | POA: Insufficient documentation

## 2011-04-24 DIAGNOSIS — K2990 Gastroduodenitis, unspecified, without bleeding: Secondary | ICD-10-CM

## 2011-04-24 HISTORY — PX: PEG PLACEMENT: SHX5437

## 2011-04-24 HISTORY — PX: ESOPHAGOGASTRODUODENOSCOPY: SHX5428

## 2011-04-24 SURGERY — EGD (ESOPHAGOGASTRODUODENOSCOPY)
Anesthesia: Moderate Sedation

## 2011-04-24 MED ORDER — MEPERIDINE HCL 50 MG/ML IJ SOLN
INTRAMUSCULAR | Status: AC
  Filled 2011-04-24: qty 1

## 2011-04-24 MED ORDER — MEPERIDINE HCL 25 MG/ML IJ SOLN
INTRAMUSCULAR | Status: DC | PRN
  Administered 2011-04-24 (×2): 25 mg via INTRAVENOUS

## 2011-04-24 MED ORDER — BUTAMBEN-TETRACAINE-BENZOCAINE 2-2-14 % EX AERO
INHALATION_SPRAY | CUTANEOUS | Status: DC | PRN
  Administered 2011-04-24: 1 via TOPICAL

## 2011-04-24 MED ORDER — MIDAZOLAM HCL 5 MG/5ML IJ SOLN
INTRAMUSCULAR | Status: AC
  Filled 2011-04-24: qty 10

## 2011-04-24 MED ORDER — PANTOPRAZOLE SODIUM 40 MG PO TBEC: 40.0000 mg | DELAYED_RELEASE_TABLET | Freq: Every day | ORAL | Status: DC

## 2011-04-24 MED ORDER — MIDAZOLAM HCL 5 MG/5ML IJ SOLN
INTRAMUSCULAR | Status: DC | PRN
  Administered 2011-04-24 (×2): 2 mg via INTRAVENOUS

## 2011-04-24 MED ORDER — CEFAZOLIN SODIUM 1-5 GM-% IV SOLN
INTRAVENOUS | Status: AC
  Filled 2011-04-24: qty 50

## 2011-04-24 MED ORDER — CEFAZOLIN SODIUM 1-5 GM-% IV SOLN: 1.0000 g | Freq: Once | INTRAVENOUS | Status: DC

## 2011-04-24 NOTE — H&P (Addendum)
Patient is here for PEG placement. Patient was last seen in the office on 04/22/2011. His aspirin is on hold. No other changes since last seen. Procedure reviewed with the patient again and he is agreeable to proceed with it

## 2011-04-24 NOTE — Brief Op Note (Signed)
04/24/2011  8:28 AM  PATIENT:  George Bradley  70 y.o. male  PRE-OPERATIVE DIAGNOSIS:  tongue cancer  POST-OPERATIVE DIAGNOSIS:  * No post-op diagnosis entered *  PROCEDURE:  Procedure(s): ESOPHAGOGASTRODUODENOSCOPY (EGD) PERCUTANEOUS ENDOSCOPIC GASTROSTOMY (PEG) PLACEMENT  SURGEON:  Surgeon(s): Malissa Hippo, MD   Esophagogastroduodenoscopy note. Single erosion at distal esophagus with edema and erythema at GE junction which was located at 40 cm from the incisor. Single erosion at antrum. Bulbar duodenitis with edema and erythema. Unable to place PEG since stomach is high riding.

## 2011-04-24 NOTE — Op Note (Signed)
NAME:  George Bradley, PFEIFER NO.:  [id number]  MEDICAL RECORD NO.:  [id number]  LOCATION:  [facility]                          FACILITY:  [facility]  PHYSICIAN:  Lionel December, M.D.    DATE OF BIRTH:  Oct 21, 1940  DATE OF PROCEDURE:  04/24/2011 DATE OF DISCHARGE:                              OPERATIVE REPORT   PROCEDURE:  Esophagogastroduodenoscopy and unsuccessful attempt at PEG placement.  INDICATION:  Rosanne Ashing is a 70 year old Caucasian male who recently diagnosed with squamous cell carcinoma, base of tongue with metastatic regional lymph nodes.  He is getting ready to initiate chemo and radiation therapy.  Dr. Mariel Sleet requested PEG placement anticipating that he will develop swallowing problems during therapy.  Procedure risks were reviewed with the patient and informed consent was obtained.  MEDICATIONS FOR CONSCIOUS SEDATION: 1. Cetacaine spray for pharyngeal topical anesthesia. 2. Demerol 50 mg IV. 3. Versed 4 mg IV.  FINDINGS:  Procedure performed in endoscopy suite.  The patient's vital signs and O2 sats were monitored during the procedure and remained stable.  The patient was placed in left lateral recumbent position and Pentax videoscope was passed through oropharynx without any difficulty into esophagus.  Esophagus.  Mucosa of the esophagus was normal except there was single linear erosion distally and edema and erythema at GE junction which was located at 40-cm from the incisors.  Stomach.  It was empty and distended very well with insufflation.  Folds of the proximal stomach are normal.  Examination of mucosa revealed single erosion at the antrum.  Pyloric channel was patent.  Fundus, cardia and angularis were examined by retroflexing scope and were normal.  Duodenum.  There was patchy edema and erythema to bulbar mucosa. Postbulbar mucosa was normal.  Endoscope was pulled back to the stomach.  The patient was turned into prone position.  Stomach was  insufflated.  However, could not find the right spot to place PEG.  Stomach was felt to be high riding mainly on the rib cage.  Therefore, gastrostomy tube could not be placed. Endoscope was withdrawn.  The patient tolerated the procedure well.  FINAL DIAGNOSES: 1. Could not place percutaneous endoscopic gastrostomy secondary to     high riding stomach. 2. Erosive reflux esophagitis. 3. Gastroduodenitis.  RECOMMENDATIONS: 1. We will talk with interventional radiologist if PEG place,     otherwise will have to be open procedure. 2. Antireflux measures. 3. We will check his H. pylori serology today. 4. Pantoprazole 40 mg 30 minutes before breakfast daily prescription     given for 30 with 5 refills. 5. The patient will continue to hold his aspirin for 3 more days since     he is having Port-A-Cath placed in a.m.          ______________________________ Lionel December, M.D.     NR/MEDQ  D:  04/24/2011  T:  04/24/2011  Job:  [id number]  cc:   Ladona Horns. Mariel Sleet, MD Fax: [phone]  Gwen Pounds, MD Fax: [phone]

## 2011-04-25 ENCOUNTER — Ambulatory Visit (HOSPITAL_COMMUNITY)
Admission: RE | Admit: 2011-04-25 | Discharge: 2011-04-25 | Disposition: A | Discharge status: Home / Self Care | Payer: Medicare Other | Source: Ambulatory Visit | Attending: Oncology | Admitting: Oncology

## 2011-04-25 DIAGNOSIS — C029 Malignant neoplasm of tongue, unspecified: Secondary | ICD-10-CM | POA: Insufficient documentation

## 2011-04-25 DIAGNOSIS — Z951 Presence of aortocoronary bypass graft: Secondary | ICD-10-CM | POA: Insufficient documentation

## 2011-04-25 DIAGNOSIS — I2510 Atherosclerotic heart disease of native coronary artery without angina pectoris: Secondary | ICD-10-CM | POA: Insufficient documentation

## 2011-04-28 ENCOUNTER — Other Ambulatory Visit (HOSPITAL_COMMUNITY): Payer: Self-pay | Admitting: Oncology

## 2011-04-28 DIAGNOSIS — C029 Malignant neoplasm of tongue, unspecified: Secondary | ICD-10-CM

## 2011-04-28 DIAGNOSIS — Z0189 Encounter for other specified special examinations: Secondary | ICD-10-CM

## 2011-04-29 ENCOUNTER — Other Ambulatory Visit (HOSPITAL_COMMUNITY): Payer: Self-pay | Admitting: Oncology

## 2011-04-29 DIAGNOSIS — C029 Malignant neoplasm of tongue, unspecified: Secondary | ICD-10-CM

## 2011-05-01 ENCOUNTER — Ambulatory Visit: Payer: Medicare Other | Admitting: *Deleted

## 2011-05-01 ENCOUNTER — Encounter (HOSPITAL_COMMUNITY): Payer: Self-pay | Admitting: Internal Medicine

## 2011-05-01 ENCOUNTER — Other Ambulatory Visit (HOSPITAL_COMMUNITY): Payer: Self-pay | Admitting: Oncology

## 2011-05-01 DIAGNOSIS — C029 Malignant neoplasm of tongue, unspecified: Secondary | ICD-10-CM

## 2011-05-01 MED ORDER — DEXAMETHASONE 4 MG PO TABS: ORAL_TABLET | ORAL | Status: DC

## 2011-05-01 MED ORDER — PROCHLORPERAZINE MALEATE 10 MG PO TABS: 10.0000 mg | ORAL_TABLET | Freq: Four times a day (QID) | ORAL | Status: DC | PRN

## 2011-05-01 MED ORDER — LORAZEPAM 0.5 MG PO TABS: 0.5000 mg | ORAL_TABLET | Freq: Four times a day (QID) | ORAL | Status: DC | PRN

## 2011-05-01 MED ORDER — PROCHLORPERAZINE 25 MG RE SUPP: 25.0000 mg | Freq: Two times a day (BID) | RECTAL | Status: DC | PRN

## 2011-05-02 ENCOUNTER — Other Ambulatory Visit (HOSPITAL_COMMUNITY): Payer: Self-pay | Admitting: Oncology

## 2011-05-02 ENCOUNTER — Other Ambulatory Visit (HOSPITAL_COMMUNITY): Payer: Self-pay

## 2011-05-02 ENCOUNTER — Ambulatory Visit (HOSPITAL_COMMUNITY): Payer: Medicare Other

## 2011-05-02 ENCOUNTER — Ambulatory Visit (HOSPITAL_COMMUNITY)
Admission: RE | Admit: 2011-05-02 | Discharge: 2011-05-02 | Disposition: A | Discharge status: Home / Self Care | Payer: Medicare Other | Source: Ambulatory Visit | Attending: Oncology | Admitting: Oncology

## 2011-05-02 DIAGNOSIS — Z5309 Procedure and treatment not carried out because of other contraindication: Secondary | ICD-10-CM | POA: Insufficient documentation

## 2011-05-02 DIAGNOSIS — C01 Malignant neoplasm of base of tongue: Secondary | ICD-10-CM | POA: Insufficient documentation

## 2011-05-02 DIAGNOSIS — C029 Malignant neoplasm of tongue, unspecified: Secondary | ICD-10-CM

## 2011-05-02 LAB — CBC
HCT: 43.5 % (ref 39.0–52.0)
MCH: 32.4 pg (ref 26.0–34.0)
MCHC: 35.4 g/dL (ref 30.0–36.0)
MCV: 91.6 fL (ref 78.0–100.0)
RDW: 13.7 % (ref 11.5–15.5)

## 2011-05-05 ENCOUNTER — Inpatient Hospital Stay (HOSPITAL_COMMUNITY): Payer: Medicare Other

## 2011-05-05 ENCOUNTER — Telehealth (INDEPENDENT_AMBULATORY_CARE_PROVIDER_SITE_OTHER): Payer: Self-pay | Admitting: Internal Medicine

## 2011-05-05 ENCOUNTER — Other Ambulatory Visit (HOSPITAL_COMMUNITY): Payer: Medicare Other

## 2011-05-06 ENCOUNTER — Ambulatory Visit (INDEPENDENT_AMBULATORY_CARE_PROVIDER_SITE_OTHER): Payer: Medicare Other | Admitting: General Surgery

## 2011-05-06 ENCOUNTER — Other Ambulatory Visit: Payer: Self-pay | Admitting: Oncology

## 2011-05-06 ENCOUNTER — Encounter (INDEPENDENT_AMBULATORY_CARE_PROVIDER_SITE_OTHER): Payer: Self-pay | Admitting: General Surgery

## 2011-05-06 ENCOUNTER — Encounter (HOSPITAL_BASED_OUTPATIENT_CLINIC_OR_DEPARTMENT_OTHER): Payer: Medicare Other | Admitting: Oncology

## 2011-05-06 ENCOUNTER — Other Ambulatory Visit (HOSPITAL_COMMUNITY): Payer: Self-pay | Admitting: Oncology

## 2011-05-06 VITALS — BP 116/74 | HR 68 | Temp 97.1°F | Resp 16 | Ht 73.0 in | Wt 197.8 lb

## 2011-05-06 DIAGNOSIS — E785 Hyperlipidemia, unspecified: Secondary | ICD-10-CM

## 2011-05-06 DIAGNOSIS — F17200 Nicotine dependence, unspecified, uncomplicated: Secondary | ICD-10-CM

## 2011-05-06 DIAGNOSIS — C01 Malignant neoplasm of base of tongue: Secondary | ICD-10-CM

## 2011-05-06 DIAGNOSIS — C029 Malignant neoplasm of tongue, unspecified: Secondary | ICD-10-CM

## 2011-05-06 LAB — COMPREHENSIVE METABOLIC PANEL
AST: 25 U/L (ref 0–37)
Albumin: 4.2 g/dL (ref 3.5–5.2)
Alkaline Phosphatase: 25 U/L — ABNORMAL LOW (ref 39–117)
BUN: 15 mg/dL (ref 6–23)
Potassium: 4.4 mEq/L (ref 3.5–5.3)
Sodium: 138 mEq/L (ref 135–145)
Total Bilirubin: 0.6 mg/dL (ref 0.3–1.2)

## 2011-05-06 LAB — CBC WITH DIFFERENTIAL/PLATELET
Basophils Absolute: 0.1 10*3/uL (ref 0.0–0.1)
EOS%: 2.8 % (ref 0.0–7.0)
LYMPH%: 45.1 % (ref 14.0–49.0)
MCH: 32.9 pg (ref 27.2–33.4)
MCV: 95.7 fL (ref 79.3–98.0)
MONO%: 10.9 % (ref 0.0–14.0)
RBC: 4.66 10*6/uL (ref 4.20–5.82)
RDW: 14.1 % (ref 11.0–14.6)

## 2011-05-06 NOTE — Progress Notes (Signed)
George Bradley is a 70 y.o. male.    Chief Complaint  Patient presents with  . Other    Eval for feeding tube placement    HPI HPI This patient is referred by Dr. Gaylyn Rong for placement of a surgical gastrostomy or jejunostomy tube. He has fairly recently diagnosed tongue and head and neck cancer which is stage IV and is scheduled to start chemotherapy and neck radiation next week. He already underwent attempted endoscopic PEG placement but the GI doctor was unable to obtain transillumination and the procedure was aborted. He was then referred to IR for attempted placement but he was told he had a high riding stomach. He can eat currently but is anticipated that with his radiation treatment he will have dysphagia and needs supplemental nutrition. He has a history of cardiac bypass 12 years ago but states that this is in the distant past and he has been asymptomatic since then. He states that he had a normal stress test recently.  Past Medical History  Diagnosis Date  . Ischemic heart disease   . Aortic stenosis, mild   . Hyperlipidemia   . Insomnia   . Fatigue   . Dizziness   . Hypotension     orthostatic  . History of echocardiogram 02/09/2009    EF -  55-60%  . History of cardiovascular stress test 03/14/2009    EF - 57%   /  mild anterior hyperkinesia with discrete anterior scar consistent with previous infarction but unchanged from previous studies.  Overall, stable findings -- Colleen Can. Deborah Chalk, MD  . Weakness   . ASCVD (arteriosclerotic cardiovascular disease)   . Arrhythmia     known history of  . Cancer   . Squamous cell cancer of tongue 04/15/2011    Past Surgical History  Procedure Date  . 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 Cardiac catheterization 2007    EF - 55%  . Ablation of dysrhythmic focus   . Esophagogastroduodenoscopy 04/24/2011    Procedure: ESOPHAGOGASTRODUODENOSCOPY (EGD);  Surgeon: Malissa Hippo, MD;  Location: [facility];  Service: Endoscopy;   Laterality: N/A;  8:30 am  . Peg placement 04/24/2011    Procedure: PERCUTANEOUS ENDOSCOPIC GASTROSTOMY (PEG) PLACEMENT;  Surgeon: Malissa Hippo, MD;  Location: [facility];  Service: Endoscopy;  Laterality: N/A;    Family History  Problem Relation Age of Onset  . Heart disease Father   . Heart disease Brother   . Heart disease Sister   . Cancer Sister     breast cancer  . Cancer Mother     breast ca    Social History History  Substance Use Topics  . Smoking status: Current Some Day Smoker    Types: Cigars  . Smokeless tobacco: Former Neurosurgeon    Quit date: 05/06/2011   Comment: 2 cigars a week  . Alcohol Use: Yes     occasionally glass of wine    No Known Allergies  Current Outpatient Prescriptions  Medication Sig Dispense Refill  . celecoxib (CELEBREX) 200 MG capsule Take 200 mg by mouth daily.        . diphenhydramine-acetaminophen (TYLENOL PM) 25-500 MG TABS Take 1 tablet by mouth at bedtime as needed.        . pantoprazole (PROTONIX) 40 MG tablet Take 1 tablet (40 mg total) by mouth daily.  30 tablet  5  . rosuvastatin (CRESTOR) 20 MG tablet Take 20 mg by mouth daily.        Marland Kitchen  zolpidem (AMBIEN) 10 MG tablet Take 10 mg by mouth at bedtime as needed. 1/2 tablet at bedtime as needed         Review of Systems Review of Systems  Constitutional: Negative.   HENT: Negative.   Eyes: Negative.   Respiratory: Negative.   Cardiovascular: Negative.   Gastrointestinal: Negative.   Genitourinary: Negative.   Musculoskeletal: Positive for joint pain.  Skin: Negative.   Neurological: Negative.   Endo/Heme/Allergies: Negative.   Psychiatric/Behavioral: Negative.     Physical Exam Physical Exam  Constitutional: He is oriented to person, place, and time. He appears well-developed and well-nourished. No distress.  HENT:  Head: Normocephalic and atraumatic.  Mouth/Throat: No oropharyngeal exudate.  Eyes: Conjunctivae and EOM are normal. Pupils are equal, round, and reactive  to light. Right eye exhibits no discharge. Left eye exhibits no discharge. No scleral icterus.  Neck: Normal range of motion. Neck supple. Tracheal deviation present.       Right neck mass  Cardiovascular: Normal rate, regular rhythm and normal heart sounds.   Respiratory: Effort normal and breath sounds normal. No stridor. No respiratory distress. He has no wheezes.       Sternal incision   GI: Soft. Bowel sounds are normal. He exhibits no distension. There is no tenderness. There is no rebound.  Musculoskeletal: Normal range of motion.  Lymphadenopathy:    He has cervical adenopathy.  Neurological: He is alert and oriented to person, place, and time.  Skin: Skin is warm and dry. No rash noted. He is not diaphoretic. No erythema. No pallor.  Psychiatric: He has a normal mood and affect. His behavior is normal. Judgment and thought content normal.     Blood pressure 116/74, pulse 68, temperature 97.1 F (36.2 C), temperature source Temporal, resp. rate 16, height 6\' 1"  (1.854 m), weight 197 lb 12.8 oz (89.721 kg).  Assessment/Plan Head and neck cancer, stage 4 malignancy Since he has had 2 prior unsuccessful PEG tube placements with both endoscopic and interventional radiology attempts, I think it is reasonable to attempt laparoscopic guided PEG placement or potentially open gastrostomy or jejunostomy placement. I discussed with him the procedure including the alternatives and the risks. Discussed with them the risk of infection, bleeding, pain, scarring, tube dislodgment or malfunction, injury to the bowel, leakage, and need for future surgery and he expressed understanding and desires to proceed with laparoscopic assisted PEG placement or possible open gastrostomy or jejunostomy. We'll plan for this as soon as possible given his upcoming treatment.  Lodema Pilot DAVID 05/06/2011, 4:05 PM

## 2011-05-07 ENCOUNTER — Telehealth: Payer: Self-pay | Admitting: Nurse Practitioner

## 2011-05-07 ENCOUNTER — Ambulatory Visit (HOSPITAL_COMMUNITY): Payer: Medicare Other

## 2011-05-07 ENCOUNTER — Inpatient Hospital Stay (HOSPITAL_COMMUNITY): Payer: Medicare Other

## 2011-05-07 ENCOUNTER — Ambulatory Visit (HOSPITAL_COMMUNITY)
Admission: RE | Admit: 2011-05-07 | Discharge: 2011-05-08 | Disposition: A | Discharge status: Home / Self Care | Payer: Medicare Other | Source: Ambulatory Visit | Attending: General Surgery | Admitting: General Surgery

## 2011-05-07 DIAGNOSIS — K219 Gastro-esophageal reflux disease without esophagitis: Secondary | ICD-10-CM | POA: Insufficient documentation

## 2011-05-07 DIAGNOSIS — L821 Other seborrheic keratosis: Secondary | ICD-10-CM

## 2011-05-07 DIAGNOSIS — I2510 Atherosclerotic heart disease of native coronary artery without angina pectoris: Secondary | ICD-10-CM | POA: Insufficient documentation

## 2011-05-07 DIAGNOSIS — Z01818 Encounter for other preprocedural examination: Secondary | ICD-10-CM | POA: Insufficient documentation

## 2011-05-07 DIAGNOSIS — C029 Malignant neoplasm of tongue, unspecified: Secondary | ICD-10-CM

## 2011-05-07 DIAGNOSIS — K429 Umbilical hernia without obstruction or gangrene: Secondary | ICD-10-CM | POA: Insufficient documentation

## 2011-05-07 DIAGNOSIS — Z01810 Encounter for preprocedural cardiovascular examination: Secondary | ICD-10-CM | POA: Insufficient documentation

## 2011-05-07 DIAGNOSIS — C01 Malignant neoplasm of base of tongue: Secondary | ICD-10-CM | POA: Insufficient documentation

## 2011-05-07 NOTE — Telephone Encounter (Signed)
Confirmed with Leanne was faxed today Cath, Echo, OV Note, and Lab Work, EKG

## 2011-05-07 NOTE — Telephone Encounter (Signed)
Please fax over latest OV, EKG, Echo, Stress if at all available to [facility] in 30-45 mins

## 2011-05-08 ENCOUNTER — Other Ambulatory Visit (INDEPENDENT_AMBULATORY_CARE_PROVIDER_SITE_OTHER): Payer: Self-pay | Admitting: General Surgery

## 2011-05-09 ENCOUNTER — Encounter (HOSPITAL_BASED_OUTPATIENT_CLINIC_OR_DEPARTMENT_OTHER): Payer: Medicare Other | Admitting: Oncology

## 2011-05-09 DIAGNOSIS — C01 Malignant neoplasm of base of tongue: Secondary | ICD-10-CM

## 2011-05-09 DIAGNOSIS — Z5111 Encounter for antineoplastic chemotherapy: Secondary | ICD-10-CM

## 2011-05-12 NOTE — Telephone Encounter (Signed)
None

## 2011-05-13 ENCOUNTER — Other Ambulatory Visit (HOSPITAL_COMMUNITY): Payer: Medicare Other

## 2011-05-13 NOTE — Op Note (Signed)
NAME:  ASHLEE, BEWLEY NO.:  [id number]  MEDICAL RECORD NO.:  [id number]  LOCATION:  5154                         FACILITY:  [facility]  PHYSICIAN:  Lodema Pilot, MD       DATE OF BIRTH:  Jun 29, 1941  DATE OF PROCEDURE:  05/07/2011 DATE OF DISCHARGE:                              OPERATIVE REPORT   PROCEDURE:  Upper gastrointestinal endoscopy with laparoscopic-assisted percutaneous gastrostomy tube placement and umbilical hernia repair and excision of periumbilical skin lesion.  PREOPERATIVE DIAGNOSIS:  Tongue cancer.  POSTOPERATIVE DIAGNOSIS:  Tongue cancer.  SURGEON:  Lodema Pilot, MD  ASSISTANT:  Abigail Miyamoto, MD  ANESTHESIA:  General endotracheal tube anesthesia with 25 mL of 0.25% Marcaine with epinephrine.  FLUIDS:  600 mL.  ESTIMATED BLOOD LOSS:  Minimal.  DRAINS:  24-French gastrostomy tube.  SPECIMENS:  Periumbilical skin lesion sent to pathology for permanent sectioning.  COMPLICATIONS:  None apparent.  FINDINGS:  Successful placement of a 24-French percutaneous gastrostomy tube with primary umbilical hernia repair and excision of a supraumbilical skin lesion which is likely a seborrheic keratosis.  INDICATIONS FOR PROCEDURE:  Mr. 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  OPERATIVE DETAILS:  Mr. Chaffin was seen and evaluated in the preoperative area and risks and benefits of the procedure were again discussed in lay terms.  Informed consent was obtained.  Prophylactic antibiotics were given.  He was taken to the operating room and placed on table in supine position.  His arms were tucked bilaterally.  General endotracheal tube anesthesia was obtained and a flexible lighted endoscope  was passed into the mouth and oropharynx and into the esophagus without difficulty.  The endoscope was advanced into the stomach and there were no obvious stomach lesions noted.  He did not appear to have any significant hiatal hernia noted on retroflexion of the scope.  The stomach was insufflated and we attempted to transilluminate the abdominal wall with the endoscope and find a suitable placement for percutaneous gastrostomy tube.  However, transillumination was difficult except for in the area of the xiphoid process immediately under the ribs and we felt that this would be a very uncomfortable and difficult position for tube placement. We decided to proceed with the laparoscopic assistance and a supraumbilical incision was made in the skin and he had a dark, pigmented lesion in the area of the planned incision.  So, this area was ellipsed and the lesion was excised and sent to pathology for permanent section.  The dissection was carried down to the abdominal fascia, however, he was noted to have an umbilical hernia and approximately 1-cm fascial defect in the area immediately underlying our incision with herniation of preperitoneal fat.  The fat was removed and the fascial defect was enlarged slightly to accommodate our 12-mm Hasson port and the peritoneum was entered and the port was placed through the hernia defect.  Pneumoperitoneum was obtained and the stomach was visualized in the usual anatomic position  and was insufflated with air.  However, it did appeared to be fairly high up under the ribs, so decided to place 2 additional ports in the right upper quadrant and to assist with gastrostomy tube placement, grasping the greater curvature of the stomach.  It was elevated to the abdominal wall in the area usually used for gastrostomy tube placement and with decreasing insufflation, the stomach appeared to reach without difficulty.  Again, he did not appear to have a significant  anatomic defect or abnormality with the stomach. In order to facilitate the PEG tube placement, the site for the gastrostomy tube was marked and on each side of the gastrostomy tube site a separate stab incision was used to place a transfascial suture. A 2-0 Prolene suture on a Keith needle was passed through the abdominal wall and a seromuscular bite was taken in the body of the stomach approaching the greater curvature and an Endoclose device was used to draw the suture back up through the abdominal wall through the same stab incision.  This was repeated on each side of our chosen site for gastrostomy placement.  The pneumoperitoneum was taken down but the camera was left in place with slight pneumoperitoneum and the stomach was visualized approaching the abdominal wall without undue tension. There was no bowel between the stomach and the abdominal wall.  Using these sutures to secure the stomach to the abdominal wall, we proceeded then with standard PEG placement.  An incision was made in the skin and the access needle was passed through the abdominal wall and into the stomach under endoscopic guidance and the guidewire was passed through the abdominal wall and into the stomach.  Snare was used to withdraw the guidewire back up in a retrograde fashion through the esophagus and mouth and the 24-French PEG tube was placed on the wire and it was pulled through the stomach and abdominal wall and secured at 3 cm on the skin.  The provided bumper was placed over the tube and was sutured to the abdominal wall with 2-0 nylon sutures.  The endoscope was reintroduced into the mouth into the stomach and the bumper appeared to be sitting in good position and did not appear to be too tight on the stomach internally.  It turned without difficulty but was not pulled away from the abdominal wall.  The transfascial stay sutures in the stomach were secured and the PEG tube was placed to gravity  drainage. The laparoscope was again reintroduced into the abdomen and PEG tube appeared to be in good position in the stomach and no evidence of bowel injury.  The ports were removed and the fascia at the umbilical trocar site was approximated with interrupted 0-Vicryl sutures and these were used to close the fascial defect of his umbilical hernia which was used to access the abdomen for the procedure.  Care was taken to avoid injury to the underlying bowel contents.  This provided good approximation of the fascia and the wounds were irrigated with sterile saline solution injected with 0.25% Marcaine with epinephrine.  The skin edges were approximated with 4-0 Monocryl subcuticular suture and the skin was washed and dried and Dermabond was applied at all skin incisions.  The patient tolerated the procedure well and there were no apparent complications.          ______________________________ Lodema Pilot, MD     BL/MEDQ  D:  05/07/2011  T:  05/08/2011  Job:  [id number]  Electronically Signed by Lodema Pilot DO  on 05/13/2011 09:47:20 AM

## 2011-05-14 ENCOUNTER — Inpatient Hospital Stay (HOSPITAL_COMMUNITY): Payer: Medicare Other

## 2011-05-16 ENCOUNTER — Other Ambulatory Visit: Payer: Self-pay | Admitting: Oncology

## 2011-05-16 ENCOUNTER — Encounter (HOSPITAL_BASED_OUTPATIENT_CLINIC_OR_DEPARTMENT_OTHER): Payer: Medicare Other | Admitting: Oncology

## 2011-05-16 DIAGNOSIS — C01 Malignant neoplasm of base of tongue: Secondary | ICD-10-CM

## 2011-05-16 LAB — COMPREHENSIVE METABOLIC PANEL
BUN: 22 mg/dL (ref 6–23)
CO2: 22 mEq/L (ref 19–32)
Calcium: 9.9 mg/dL (ref 8.4–10.5)
Chloride: 100 mEq/L (ref 96–112)
Creatinine, Ser: 1.4 mg/dL — ABNORMAL HIGH (ref 0.50–1.35)
Glucose, Bld: 94 mg/dL (ref 70–99)
Total Bilirubin: 0.5 mg/dL (ref 0.3–1.2)

## 2011-05-16 LAB — CBC WITH DIFFERENTIAL/PLATELET
BASO%: 0.4 % (ref 0.0–2.0)
Basophils Absolute: 0 10*3/uL (ref 0.0–0.1)
Eosinophils Absolute: 0.1 10*3/uL (ref 0.0–0.5)
HCT: 42.3 % (ref 38.4–49.9)
HGB: 14.6 g/dL (ref 13.0–17.1)
LYMPH%: 27.8 % (ref 14.0–49.0)
MCHC: 34.4 g/dL (ref 32.0–36.0)
MONO#: 0.8 10*3/uL (ref 0.1–0.9)
NEUT%: 57.8 % (ref 39.0–75.0)
Platelets: 156 10*3/uL (ref 140–400)
WBC: 6.9 10*3/uL (ref 4.0–10.3)
lymph#: 1.9 10*3/uL (ref 0.9–3.3)

## 2011-05-16 LAB — MAGNESIUM: Magnesium: 2.3 mg/dL (ref 1.5–2.5)

## 2011-05-19 ENCOUNTER — Other Ambulatory Visit (HOSPITAL_COMMUNITY): Payer: Medicare Other

## 2011-05-19 ENCOUNTER — Encounter (HOSPITAL_BASED_OUTPATIENT_CLINIC_OR_DEPARTMENT_OTHER): Payer: Medicare Other | Admitting: Oncology

## 2011-05-19 DIAGNOSIS — C01 Malignant neoplasm of base of tongue: Secondary | ICD-10-CM

## 2011-05-20 ENCOUNTER — Encounter (INDEPENDENT_AMBULATORY_CARE_PROVIDER_SITE_OTHER): Payer: Self-pay | Admitting: Surgery

## 2011-05-20 ENCOUNTER — Ambulatory Visit (INDEPENDENT_AMBULATORY_CARE_PROVIDER_SITE_OTHER): Payer: Medicare Other | Admitting: Surgery

## 2011-05-20 VITALS — BP 124/86 | HR 63

## 2011-05-20 DIAGNOSIS — Z9889 Other specified postprocedural states: Secondary | ICD-10-CM

## 2011-05-20 NOTE — Progress Notes (Signed)
The patient presents to the office today future redness around his feeding tube. He had a laparoscopic feeding tube placed about 2 weeks ago by Dr. Biagio Quint.  He is to hold more redness around the site. He was seen by his oncologist yesterday in place him on antibiotics. He denies any fever or chills.  Exam: Gastrostomy tube in left upper quadrant.  The tube flushes easily per patient. On examination, there is redness around the stitches holding the flange of the feeding tube in place. I removed these. Gauze was placed underneath the flange.  Impression: Erythema around gastrostomy tube  Plan: I told to go ahead and finish his antibiotics and Dr. Gaylyn Rong date seen yesterday. He is unclear what these are. I see no signs of any deep infection or intra-abdominal infection. He will followup in 7-10 days with Dr. Biagio Quint.

## 2011-05-20 NOTE — Patient Instructions (Signed)
Follow up with Dr Biagio Quint in 7-10 days.  Keep a gauze under the feeding tube.  Change daily.  If you develop fever,  Chills,  Or increasing redness or pain,  Call us.  Finish antibiotics.

## 2011-05-21 ENCOUNTER — Inpatient Hospital Stay (HOSPITAL_COMMUNITY): Payer: Medicare Other

## 2011-05-23 ENCOUNTER — Encounter (HOSPITAL_BASED_OUTPATIENT_CLINIC_OR_DEPARTMENT_OTHER): Payer: Medicare Other | Admitting: Oncology

## 2011-05-23 ENCOUNTER — Other Ambulatory Visit: Payer: Self-pay | Admitting: Oncology

## 2011-05-23 DIAGNOSIS — C01 Malignant neoplasm of base of tongue: Secondary | ICD-10-CM

## 2011-05-23 LAB — COMPREHENSIVE METABOLIC PANEL
ALT: 14 U/L (ref 0–53)
AST: 16 U/L (ref 0–37)
Albumin: 4.3 g/dL (ref 3.5–5.2)
BUN: 13 mg/dL (ref 6–23)
CO2: 26 mEq/L (ref 19–32)
Calcium: 8.9 mg/dL (ref 8.4–10.5)
Chloride: 106 mEq/L (ref 96–112)
Creatinine, Ser: 1.19 mg/dL (ref 0.50–1.35)
Potassium: 4.3 mEq/L (ref 3.5–5.3)

## 2011-05-23 LAB — CBC WITH DIFFERENTIAL/PLATELET
Basophils Absolute: 0 10*3/uL (ref 0.0–0.1)
EOS%: 2.1 % (ref 0.0–7.0)
HCT: 39.8 % (ref 38.4–49.9)
HGB: 13.8 g/dL (ref 13.0–17.1)
MCH: 33.1 pg (ref 27.2–33.4)
MONO#: 0.6 10*3/uL (ref 0.1–0.9)
NEUT#: 3 10*3/uL (ref 1.5–6.5)
NEUT%: 62 % (ref 39.0–75.0)
RDW: 13.4 % (ref 11.0–14.6)
WBC: 4.8 10*3/uL (ref 4.0–10.3)
lymph#: 1.1 10*3/uL (ref 0.9–3.3)

## 2011-05-23 LAB — MAGNESIUM: Magnesium: 1.8 mg/dL (ref 1.5–2.5)

## 2011-05-27 ENCOUNTER — Encounter (HOSPITAL_BASED_OUTPATIENT_CLINIC_OR_DEPARTMENT_OTHER): Payer: Medicare Other | Admitting: Oncology

## 2011-05-27 DIAGNOSIS — Z5111 Encounter for antineoplastic chemotherapy: Secondary | ICD-10-CM

## 2011-05-27 DIAGNOSIS — C01 Malignant neoplasm of base of tongue: Secondary | ICD-10-CM

## 2011-05-28 ENCOUNTER — Ambulatory Visit (HOSPITAL_COMMUNITY): Payer: Medicaid - Dental | Admitting: Dentistry

## 2011-05-28 DIAGNOSIS — Z09 Encounter for follow-up examination after completed treatment for conditions other than malignant neoplasm: Secondary | ICD-10-CM

## 2011-05-28 DIAGNOSIS — Z85810 Personal history of malignant neoplasm of tongue: Secondary | ICD-10-CM

## 2011-05-30 ENCOUNTER — Encounter (HOSPITAL_BASED_OUTPATIENT_CLINIC_OR_DEPARTMENT_OTHER): Payer: Medicare Other | Admitting: Oncology

## 2011-05-30 ENCOUNTER — Other Ambulatory Visit: Payer: Self-pay | Admitting: Oncology

## 2011-05-30 DIAGNOSIS — C01 Malignant neoplasm of base of tongue: Secondary | ICD-10-CM

## 2011-05-30 LAB — CBC WITH DIFFERENTIAL/PLATELET
Basophils Absolute: 0 10*3/uL (ref 0.0–0.1)
HCT: 41.3 % (ref 38.4–49.9)
HGB: 14.2 g/dL (ref 13.0–17.1)
LYMPH%: 14.9 % (ref 14.0–49.0)
MCH: 32.9 pg (ref 27.2–33.4)
MONO#: 0.8 10*3/uL (ref 0.1–0.9)
NEUT%: 65.6 % (ref 39.0–75.0)
Platelets: 111 10*3/uL — ABNORMAL LOW (ref 140–400)
WBC: 4.7 10*3/uL (ref 4.0–10.3)
lymph#: 0.7 10*3/uL — ABNORMAL LOW (ref 0.9–3.3)

## 2011-05-30 LAB — COMPREHENSIVE METABOLIC PANEL
BUN: 16 mg/dL (ref 6–23)
CO2: 22 mEq/L (ref 19–32)
Calcium: 9.2 mg/dL (ref 8.4–10.5)
Chloride: 104 mEq/L (ref 96–112)
Creatinine, Ser: 1.24 mg/dL (ref 0.50–1.35)

## 2011-05-30 LAB — MAGNESIUM: Magnesium: 1.9 mg/dL (ref 1.5–2.5)

## 2011-06-02 ENCOUNTER — Encounter (HOSPITAL_BASED_OUTPATIENT_CLINIC_OR_DEPARTMENT_OTHER): Payer: Medicare Other | Admitting: Oncology

## 2011-06-02 DIAGNOSIS — B977 Papillomavirus as the cause of diseases classified elsewhere: Secondary | ICD-10-CM

## 2011-06-02 DIAGNOSIS — Z5111 Encounter for antineoplastic chemotherapy: Secondary | ICD-10-CM

## 2011-06-02 DIAGNOSIS — C01 Malignant neoplasm of base of tongue: Secondary | ICD-10-CM

## 2011-06-02 DIAGNOSIS — D696 Thrombocytopenia, unspecified: Secondary | ICD-10-CM

## 2011-06-04 ENCOUNTER — Ambulatory Visit: Payer: Medicare Other | Attending: Oncology

## 2011-06-04 DIAGNOSIS — IMO0001 Reserved for inherently not codable concepts without codable children: Secondary | ICD-10-CM | POA: Insufficient documentation

## 2011-06-04 DIAGNOSIS — C029 Malignant neoplasm of tongue, unspecified: Secondary | ICD-10-CM | POA: Insufficient documentation

## 2011-06-04 DIAGNOSIS — R070 Pain in throat: Secondary | ICD-10-CM | POA: Insufficient documentation

## 2011-06-05 ENCOUNTER — Encounter (INDEPENDENT_AMBULATORY_CARE_PROVIDER_SITE_OTHER): Payer: Medicare Other | Admitting: General Surgery

## 2011-06-06 ENCOUNTER — Encounter (INDEPENDENT_AMBULATORY_CARE_PROVIDER_SITE_OTHER): Payer: Self-pay | Admitting: General Surgery

## 2011-06-06 ENCOUNTER — Encounter (HOSPITAL_BASED_OUTPATIENT_CLINIC_OR_DEPARTMENT_OTHER): Payer: Medicare Other | Admitting: Oncology

## 2011-06-06 ENCOUNTER — Other Ambulatory Visit: Payer: Self-pay | Admitting: Oncology

## 2011-06-06 ENCOUNTER — Encounter (INDEPENDENT_AMBULATORY_CARE_PROVIDER_SITE_OTHER): Payer: Medicare Other | Admitting: General Surgery

## 2011-06-06 ENCOUNTER — Ambulatory Visit (INDEPENDENT_AMBULATORY_CARE_PROVIDER_SITE_OTHER): Payer: Medicare Other | Admitting: General Surgery

## 2011-06-06 VITALS — BP 128/79 | HR 80 | Temp 97.6°F

## 2011-06-06 DIAGNOSIS — C029 Malignant neoplasm of tongue, unspecified: Secondary | ICD-10-CM

## 2011-06-06 DIAGNOSIS — Z4889 Encounter for other specified surgical aftercare: Secondary | ICD-10-CM

## 2011-06-06 DIAGNOSIS — Z5189 Encounter for other specified aftercare: Secondary | ICD-10-CM

## 2011-06-06 LAB — CBC WITH DIFFERENTIAL/PLATELET
Basophils Absolute: 0 10*3/uL (ref 0.0–0.1)
EOS%: 0.6 % (ref 0.0–7.0)
Eosinophils Absolute: 0 10*3/uL (ref 0.0–0.5)
HGB: 14.4 g/dL (ref 13.0–17.1)
MCH: 33.3 pg (ref 27.2–33.4)
MONO#: 0.4 10*3/uL (ref 0.1–0.9)
NEUT#: 6.3 10*3/uL (ref 1.5–6.5)
RDW: 13.6 % (ref 11.0–14.6)
WBC: 7.3 10*3/uL (ref 4.0–10.3)
lymph#: 0.5 10*3/uL — ABNORMAL LOW (ref 0.9–3.3)

## 2011-06-06 LAB — COMPREHENSIVE METABOLIC PANEL
AST: 14 U/L (ref 0–37)
Albumin: 4.2 g/dL (ref 3.5–5.2)
BUN: 21 mg/dL (ref 6–23)
Calcium: 9.6 mg/dL (ref 8.4–10.5)
Chloride: 102 mEq/L (ref 96–112)
Potassium: 4.3 mEq/L (ref 3.5–5.3)

## 2011-06-06 NOTE — Progress Notes (Signed)
Subjective:     Patient ID: George Bradley, male   DOB: December 20, 1940, 70 y.o.   MRN: [id number]  HPI The patient is one month status post laparoscopic assisted gastrostomy tube placement. He has already started his radiation treatments for his head and neck cancer. He has not been using his tube for alimentation. He states that he is maintaining his oral intake. His wife is concerned that he is not getting enough fluids and so she has been supplementing with extra flushes through his tube. He has occasional scant drainage from around his tube but no discomfort and no fevers.   Review of Systems     Objective:   Physical Exam No acute distress and nontoxic-appearing  His abdomen is soft and nontender on exam. His G-tube is without sign of infection. There is no tenderness. He does havea slight amount of erythema and skin irritation right around the entry site and he may have early fungal infection.    Assessment:     s/p lap assisted gastrostomy tube placement   Doing fine.   Plan:     The tube site looks okay. He may have slight fungal infection around the site but this is very minimal if at all present. If the redness persists then we'll try antifungal. Recommended that he uses to precipitation of calories and fluids as necessary. The patient denies any need for additional supplementation although the wife was present and she is concerned about his nutrition. They understand that the reason we placed the tube was to assist with alimentation during his radiation treatments. The tube was functioning well and he can followup in a month or 2 after his treatments are complete for tube removal if he is maintaining his own caloric intake.

## 2011-06-09 ENCOUNTER — Encounter (HOSPITAL_BASED_OUTPATIENT_CLINIC_OR_DEPARTMENT_OTHER): Payer: Medicare Other | Admitting: Oncology

## 2011-06-09 DIAGNOSIS — Z5111 Encounter for antineoplastic chemotherapy: Secondary | ICD-10-CM

## 2011-06-09 DIAGNOSIS — E860 Dehydration: Secondary | ICD-10-CM

## 2011-06-09 DIAGNOSIS — C01 Malignant neoplasm of base of tongue: Secondary | ICD-10-CM

## 2011-06-09 DIAGNOSIS — B977 Papillomavirus as the cause of diseases classified elsewhere: Secondary | ICD-10-CM

## 2011-06-11 ENCOUNTER — Encounter (HOSPITAL_BASED_OUTPATIENT_CLINIC_OR_DEPARTMENT_OTHER): Payer: Medicare Other | Admitting: Oncology

## 2011-06-11 DIAGNOSIS — B977 Papillomavirus as the cause of diseases classified elsewhere: Secondary | ICD-10-CM

## 2011-06-11 DIAGNOSIS — E860 Dehydration: Secondary | ICD-10-CM

## 2011-06-11 DIAGNOSIS — C01 Malignant neoplasm of base of tongue: Secondary | ICD-10-CM

## 2011-06-12 ENCOUNTER — Encounter (HOSPITAL_BASED_OUTPATIENT_CLINIC_OR_DEPARTMENT_OTHER): Payer: Medicare Other | Admitting: Oncology

## 2011-06-12 DIAGNOSIS — B977 Papillomavirus as the cause of diseases classified elsewhere: Secondary | ICD-10-CM

## 2011-06-12 DIAGNOSIS — E860 Dehydration: Secondary | ICD-10-CM

## 2011-06-12 DIAGNOSIS — C01 Malignant neoplasm of base of tongue: Secondary | ICD-10-CM

## 2011-06-13 ENCOUNTER — Other Ambulatory Visit: Payer: Self-pay | Admitting: Oncology

## 2011-06-13 ENCOUNTER — Encounter (HOSPITAL_BASED_OUTPATIENT_CLINIC_OR_DEPARTMENT_OTHER): Payer: Medicare Other | Admitting: Oncology

## 2011-06-13 DIAGNOSIS — E860 Dehydration: Secondary | ICD-10-CM

## 2011-06-13 DIAGNOSIS — B977 Papillomavirus as the cause of diseases classified elsewhere: Secondary | ICD-10-CM

## 2011-06-13 DIAGNOSIS — C029 Malignant neoplasm of tongue, unspecified: Secondary | ICD-10-CM

## 2011-06-13 DIAGNOSIS — C01 Malignant neoplasm of base of tongue: Secondary | ICD-10-CM

## 2011-06-13 LAB — MAGNESIUM: Magnesium: 1.9 mg/dL (ref 1.5–2.5)

## 2011-06-13 LAB — CBC WITH DIFFERENTIAL/PLATELET
Eosinophils Absolute: 0 10*3/uL (ref 0.0–0.5)
HCT: 34.4 % — ABNORMAL LOW (ref 38.4–49.9)
LYMPH%: 9.3 % — ABNORMAL LOW (ref 14.0–49.0)
MONO#: 0.3 10*3/uL (ref 0.1–0.9)
NEUT#: 2.9 10*3/uL (ref 1.5–6.5)
NEUT%: 80.9 % — ABNORMAL HIGH (ref 39.0–75.0)
Platelets: 94 10*3/uL — ABNORMAL LOW (ref 140–400)
WBC: 3.6 10*3/uL — ABNORMAL LOW (ref 4.0–10.3)

## 2011-06-13 LAB — COMPREHENSIVE METABOLIC PANEL
CO2: 25 mEq/L (ref 19–32)
Calcium: 8.5 mg/dL (ref 8.4–10.5)
Chloride: 104 mEq/L (ref 96–112)
Creatinine, Ser: 1.28 mg/dL (ref 0.50–1.35)
Glucose, Bld: 88 mg/dL (ref 70–99)
Sodium: 142 mEq/L (ref 135–145)
Total Bilirubin: 0.3 mg/dL (ref 0.3–1.2)
Total Protein: 6.3 g/dL (ref 6.0–8.3)

## 2011-06-14 ENCOUNTER — Emergency Department (HOSPITAL_COMMUNITY)
Admission: EM | Admit: 2011-06-14 | Discharge: 2011-06-15 | Disposition: A | Discharge status: Home / Self Care | Payer: Medicare Other | Attending: Emergency Medicine | Admitting: Emergency Medicine

## 2011-06-14 DIAGNOSIS — Z9221 Personal history of antineoplastic chemotherapy: Secondary | ICD-10-CM | POA: Insufficient documentation

## 2011-06-14 DIAGNOSIS — C109 Malignant neoplasm of oropharynx, unspecified: Secondary | ICD-10-CM | POA: Insufficient documentation

## 2011-06-14 DIAGNOSIS — Z923 Personal history of irradiation: Secondary | ICD-10-CM | POA: Insufficient documentation

## 2011-06-14 DIAGNOSIS — R110 Nausea: Secondary | ICD-10-CM | POA: Insufficient documentation

## 2011-06-14 DIAGNOSIS — K5900 Constipation, unspecified: Secondary | ICD-10-CM | POA: Insufficient documentation

## 2011-06-14 DIAGNOSIS — Z951 Presence of aortocoronary bypass graft: Secondary | ICD-10-CM | POA: Insufficient documentation

## 2011-06-14 DIAGNOSIS — R509 Fever, unspecified: Secondary | ICD-10-CM | POA: Insufficient documentation

## 2011-06-14 DIAGNOSIS — E7800 Pure hypercholesterolemia, unspecified: Secondary | ICD-10-CM | POA: Insufficient documentation

## 2011-06-14 DIAGNOSIS — Z931 Gastrostomy status: Secondary | ICD-10-CM | POA: Insufficient documentation

## 2011-06-14 LAB — CBC
HCT: 35.2 % — ABNORMAL LOW (ref 39.0–52.0)
Hemoglobin: 12.5 g/dL — ABNORMAL LOW (ref 13.0–17.0)
MCHC: 35.5 g/dL (ref 30.0–36.0)
RBC: 3.85 MIL/uL — ABNORMAL LOW (ref 4.22–5.81)

## 2011-06-14 LAB — BASIC METABOLIC PANEL
BUN: 16 mg/dL (ref 6–23)
CO2: 27 mEq/L (ref 19–32)
GFR calc non Af Amer: >60 mL/min (ref >60)
Glucose, Bld: 99 mg/dL (ref 70–99)
Potassium: 4.2 mEq/L (ref 3.5–5.1)
Sodium: 137 mEq/L (ref 135–145)

## 2011-06-14 LAB — DIFFERENTIAL
Basophils Absolute: 0 10*3/uL (ref 0.0–0.1)
Eosinophils Relative: 0 % (ref 0–5)
Lymphocytes Relative: 10 % — ABNORMAL LOW (ref 12–46)
Lymphs Abs: 0.4 10*3/uL — ABNORMAL LOW (ref 0.7–4.0)
Monocytes Relative: 14 % — ABNORMAL HIGH (ref 3–12)
Neutro Abs: 3 10*3/uL (ref 1.7–7.7)

## 2011-06-16 ENCOUNTER — Inpatient Hospital Stay (HOSPITAL_COMMUNITY): Payer: Medicare Other

## 2011-06-16 ENCOUNTER — Encounter: Payer: Medicare Other | Admitting: Oncology

## 2011-06-16 ENCOUNTER — Inpatient Hospital Stay (HOSPITAL_COMMUNITY)
Admission: AD | Admit: 2011-06-16 | Discharge: 2011-06-18 | DRG: 393 | Disposition: A | Discharge status: Home Health | Payer: Medicare Other | Source: Ambulatory Visit | Attending: Oncology | Admitting: Oncology

## 2011-06-16 DIAGNOSIS — Z951 Presence of aortocoronary bypass graft: Secondary | ICD-10-CM

## 2011-06-16 DIAGNOSIS — Y842 Radiological procedure and radiotherapy as the cause of abnormal reaction of the patient, or of later complication, without mention of misadventure at the time of the procedure: Secondary | ICD-10-CM | POA: Diagnosis present

## 2011-06-16 DIAGNOSIS — Y833 Surgical operation with formation of external stoma as the cause of abnormal reaction of the patient, or of later complication, without mention of misadventure at the time of the procedure: Secondary | ICD-10-CM | POA: Diagnosis present

## 2011-06-16 DIAGNOSIS — E785 Hyperlipidemia, unspecified: Secondary | ICD-10-CM | POA: Diagnosis present

## 2011-06-16 DIAGNOSIS — L0390 Cellulitis, unspecified: Secondary | ICD-10-CM

## 2011-06-16 DIAGNOSIS — C109 Malignant neoplasm of oropharynx, unspecified: Secondary | ICD-10-CM | POA: Diagnosis present

## 2011-06-16 DIAGNOSIS — K9422 Gastrostomy infection: Secondary | ICD-10-CM

## 2011-06-16 DIAGNOSIS — I2510 Atherosclerotic heart disease of native coronary artery without angina pectoris: Secondary | ICD-10-CM | POA: Diagnosis present

## 2011-06-16 DIAGNOSIS — D61810 Antineoplastic chemotherapy induced pancytopenia: Secondary | ICD-10-CM | POA: Diagnosis present

## 2011-06-16 DIAGNOSIS — Z923 Personal history of irradiation: Secondary | ICD-10-CM

## 2011-06-16 DIAGNOSIS — L02219 Cutaneous abscess of trunk, unspecified: Secondary | ICD-10-CM | POA: Diagnosis present

## 2011-06-16 DIAGNOSIS — B965 Pseudomonas (aeruginosa) (mallei) (pseudomallei) as the cause of diseases classified elsewhere: Secondary | ICD-10-CM | POA: Diagnosis present

## 2011-06-16 DIAGNOSIS — M1990 Unspecified osteoarthritis, unspecified site: Secondary | ICD-10-CM | POA: Diagnosis present

## 2011-06-16 DIAGNOSIS — T451X5A Adverse effect of antineoplastic and immunosuppressive drugs, initial encounter: Secondary | ICD-10-CM | POA: Diagnosis present

## 2011-06-16 DIAGNOSIS — Z9221 Personal history of antineoplastic chemotherapy: Secondary | ICD-10-CM

## 2011-06-16 DIAGNOSIS — L0291 Cutaneous abscess, unspecified: Secondary | ICD-10-CM

## 2011-06-16 DIAGNOSIS — E440 Moderate protein-calorie malnutrition: Secondary | ICD-10-CM | POA: Diagnosis present

## 2011-06-16 DIAGNOSIS — K1231 Oral mucositis (ulcerative) due to antineoplastic therapy: Secondary | ICD-10-CM | POA: Diagnosis present

## 2011-06-16 LAB — COMPREHENSIVE METABOLIC PANEL
ALT: 8 U/L (ref 0–53)
AST: 12 U/L (ref 0–37)
Alkaline Phosphatase: 37 U/L — ABNORMAL LOW (ref 39–117)
CO2: 26 mEq/L (ref 19–32)
GFR calc Af Amer: >60 mL/min (ref >60)
Glucose, Bld: 100 mg/dL — ABNORMAL HIGH (ref 70–99)
Potassium: 4 mEq/L (ref 3.5–5.1)
Sodium: 136 mEq/L (ref 135–145)
Total Protein: 6.6 g/dL (ref 6.0–8.3)

## 2011-06-16 LAB — CBC
MCHC: 35 g/dL (ref 30.0–36.0)
Platelets: 72 10*3/uL — ABNORMAL LOW (ref 150–400)
RDW: 12.7 % (ref 11.5–15.5)
WBC: 2.8 10*3/uL — ABNORMAL LOW (ref 4.0–10.5)

## 2011-06-17 LAB — WOUND CULTURE

## 2011-06-18 LAB — BASIC METABOLIC PANEL
BUN: 13 mg/dL (ref 6–23)
Calcium: 9.4 mg/dL (ref 8.4–10.5)
Creatinine, Ser: 1.03 mg/dL (ref 0.50–1.35)
GFR calc Af Amer: >60 mL/min (ref >60)
GFR calc non Af Amer: >60 mL/min (ref >60)

## 2011-06-18 LAB — CBC
MCH: 32.3 pg (ref 26.0–34.0)
MCHC: 35.3 g/dL (ref 30.0–36.0)
MCV: 91.6 fL (ref 78.0–100.0)
Platelets: 56 10*3/uL — ABNORMAL LOW (ref 150–400)
RDW: 12.7 % (ref 11.5–15.5)
WBC: 2.5 10*3/uL — ABNORMAL LOW (ref 4.0–10.5)

## 2011-06-20 ENCOUNTER — Other Ambulatory Visit: Payer: Self-pay | Admitting: Oncology

## 2011-06-20 ENCOUNTER — Encounter (HOSPITAL_BASED_OUTPATIENT_CLINIC_OR_DEPARTMENT_OTHER): Payer: Medicare Other | Admitting: Oncology

## 2011-06-20 DIAGNOSIS — C01 Malignant neoplasm of base of tongue: Secondary | ICD-10-CM

## 2011-06-20 LAB — CBC WITH DIFFERENTIAL/PLATELET
BASO%: 0.4 % (ref 0.0–2.0)
EOS%: 0.4 % (ref 0.0–7.0)
MCH: 33.3 pg (ref 27.2–33.4)
MCHC: 35.2 g/dL (ref 32.0–36.0)
MONO#: 0.4 10*3/uL (ref 0.1–0.9)
RBC: 3.53 10*6/uL — ABNORMAL LOW (ref 4.20–5.82)
WBC: 2.6 10*3/uL — ABNORMAL LOW (ref 4.0–10.3)
lymph#: 0.3 10*3/uL — ABNORMAL LOW (ref 0.9–3.3)

## 2011-06-21 NOTE — Discharge Summary (Signed)
NAMEMarland Bradley  RANEY, KOEPPEN NO.:  [id number]  MEDICAL RECORD NO.:  [id number]  LOCATION:  1332                         FACILITY:  [facility]  PHYSICIAN:  Exie Parody, M.D.        DATE OF BIRTH:  28-Jun-1941  DATE OF ADMISSION:  06/16/2011 DATE OF DISCHARGE:  06/18/2011                              DISCHARGE SUMMARY   DISCHARGE DIAGNOSES: 1. PEG tube site cellulitis. 2. Oropharynx squamous cell carcinoma. 3. Mucositis. 4. Pancytopenia. 5. History of coronary artery disease. 6. Hyperlipidemia.  CONSULTATION:  General Surgery, Dr. Ovidio Kin from [facility].  PROCEDURE:  Abdominal KUB, June 16, 2011, which showed PEG tube in place without obvious complicating feature.  BRIEF HISTORY OF PRESENT ILLNESS:  [name] is a 70 year old man with recent diagnosis of oropharynx squamous cell carcinoma.  He has been receiving daily radiation therapy along with weekly cisplatin.  He had a PEG tube infection and cellulitis about 3 weeks ago that resolved completely with Keflex.  The week prior to admission, he again had erythema of PEG tube site and again was tried on a course of Keflex without improvement.  He has severe abdominal pain at the PEG tube site with increasing erythema and purulent discharge despite course of Keflex.  He went to the ER on June 14, 2011, for evaluations with a swab of the PEG tube site, which grew Pseudomonas by the time he saw me in clinic on admission.  Given Pseudomonas and severe symptoms, he was admitted for evaluation.  HOSPITAL COURSE:  PEG tube cellulitis:  He was promptly evaluated by General Surgery who thought that he did not have any complicating features such as abscess.  They thought that the stopper was tight and therefore released that a little bit.  He did not have a PEG tube using for 24 hours with releasing the PEG tube site in addition to antibiotics of Zosyn protocol per pharmacy and Levaquin 500 mg p.o.  daily while waiting sensitivity.  He had significant resolution of the PEG tube site discomfort, erythema and purulent discharge.  The second day of hospital course, given his symptoms improved, he was started on continuous tube feed per instructions by nutritional consultation.  He was started on Osmolite 1.5 starting at 4 cc an hour, increasing by 10 cc per 4 hours until his goal of 70 cc an hour.  He did not have any residual discomfort with the tube feeds going at rate of 70 cc an hour.  Upon discharge, the microbiology sensitivity came back, which showed that his Pseudomonas was sensitive to Levaquin oral antibiotics and therefore, he was discontinued on Zosyn and resumed just a 5 days' course of Levaquin 500 mg p.o. daily.  His blood culture obtained on admission June 16, 2011, did not grow.  Oropharynx squamous cell carcinoma:  He continued to receive daily radiation therapy while he was here.  He has some neck swelling on the right side and was evaluated by Dr. Antony Blackbird from Radiation Oncology who adjusted his mask during radiation.  The patient continued to resort to cream to maintain comfort as the skin erythema from radiation therapy.  Mucositis:  This is expected from chemoradiation therapy.  He has been on pain medications with liquid Vicodin.  Also had morphine IV when he first presented with abdominal pain from the PEG tube; however, he did not require it in the last few days of the hospital course.  Given that he required frequent use of break-through medications, I added on for discharge fentanyl patch 25 mcg per hour, change every 3 days.  The goal was to adjust the dose once a week and use Vicodin for breakthrough.  I advised him on bowel regimen.  Pancytopenia:  This is related to recent administration of chemotherapy. During hospital course, his platelets counts decreased further.  This is most likely secondary to the use of Zosyn.  He did not have any  heparin on-board in hospital course.  There was no bleeding.  There was no need for blood transfusion.  History of Crohn disease:  No active issue at this time.  Hyperlipidemia.  He was on Crestor during the hospital course.  DISCHARGE EXAMINATION:  VITAL SIGNS:  Temperature 98.5, heart rate 72, respiratory rate 16, blood pressure 117/70, his O2 saturations 99% on room air. GENERAL:  Well-nourished male, in no acute distress. HEENT:  Oropharynx shows erythema and without exudative whitish discharge. NECK:  Supple.  There is no thyromegaly; however, he does have slight erythema of the bilateral neck, right worse than left.  I was able to palpate a 1.5 x 1.5 cm right cervical level II lymph node adenopathy. LUNGS:  Clear bilaterally without wheezing or crackles. HEART:  Regular rate and rhythm.  S1, S2 without murmur, rub, or gallop. ABDOMEN:  Soft, flat, nontender, nondistended with no peritoneal sign. No rebound tenderness.  His PEG tube was intact in place.  There was slight erythema around the stopper, however, no evidence of purulent discharge, foul-smelling discharge, or pain to palpation.  There was no pedal edema. MUSCULOSKELETAL EXAM:  Did not show any evidence of vertebral tenderness on examination.  The rest of skin exam was negative except for PEG tube site.  LABS:  WBC 2.5, hemoglobin 10.8, platelet count of 56,000, creatinine 1.03.  DISCHARGE CONDITION:  Improved.  DISCHARGE DIET:  PEG tube Osmolite 1.5 at 70 cc an hour diffusely via pump.  He has full liquid diet as needed for pressure.  His activity was to increase activity slowly.  WOUND CARE:  Dressing change to PEG tube site cleaning every day with dressing on.  DISCHARGE FOLLOWUP: 1. Clinic in the [facility] on June 23, 2011. 2. Continue daily radiation therapy as outpatient with Dr. Antony Blackbird  DISCHARGE MEDICATIONS: 1. Fentanyl patch 25 mcg per hour, change every 3 days. 2. Levaquin 500 mg  p.o. daily for 5 days. 3. Osmolite 1.5 at 70 cc an hour continuously via PEG tube feeding     pump. 4. Zofran 4 to 8 mg sublingual every 8 hours as needed nausea,     vomiting. 5. Compazine 10 mg p.o. q.6 h. p.r.n. nausea/vomiting. 6. Ambien 5 mg q.h.s. p.r.n. insomnia. 7. Aspirin 81 mg p.o. daily. 8. Ativan 0.5 mg q.6 h. p.r.n. nausea, vomiting, anxiety. 9. Crestor 20 mg p.o. q.h.s. 10.Vicodin 7.5/500 cc, use one teaspoon every 6 hours p.r.n.     breakthrough pain. 11.Pantoprazole 40 mg p.o. daily. 12.Xanax 1 mg p.o. 3 times, significant anxiety.     Exie Parody, M.D.     HTH/MEDQ  D:  06/18/2011  T:  06/18/2011  Job:  [id number]  Electronically Signed by Jethro Bolus MD on 06/21/2011 10:35:08 AM

## 2011-06-21 NOTE — H&P (Signed)
NAME:  George Bradley, George Bradley NO.:  [id number]  MEDICAL RECORD NO.:  [id number]  LOCATION:  1332                         FACILITY:  [facility]  PHYSICIAN:  Exie Parody, M.D.        DATE OF BIRTH:  Mar 13, 1941  DATE OF ADMISSION:  06/16/2011 DATE OF DISCHARGE:                             HISTORY & PHYSICAL   CHIEF COMPLAINT:  PEG tube erythema, purulent discharge, pain.  HISTORY OF PRESENT ILLNESS:  George Bradley is a 70 year old man with ongoing oropharynx squamous cell carcinoma.  He has been receiving concurrent chemoradiation therapy with weekly cisplatin started out on May 12, 2011 and radiation therapy.  His PEG tube was placed by Dr. Biagio Quint and Dr. Luisa Hart prior to chemoradiation therapy.  Since then, he has been having cellulitis twice, the 1st time was about 3 weeks ago where he received course of Keflex with complete resolution of the problem. However, last week on Tuesday, June 10, 2011, he started having erythema again and again a 2nd course of Keflex was prescribed. Unfortunately, it did not improve.  Despite Keflex, he has been having low grade fever up to 100.1 Fahrenheit.  He went to the ER on June 12, 2011 where a swab culture of this was obtained, which showed Pseudomonas aeruginosa.  He was discharged from ER not knowing the culture and advised to continue Keflex.  For the past few days, he has been having worsening PEG tube site pain, was 10/10, and did not have much improvement with liquid Vicodin.  The tube site looks more erythematous without significant improvement and there has been more purulent discharge.  He has nausea, no vomiting.  He is not able to tolerate much of the feeding because of feeling weak and nausea.  He thinks that the nausea has been getting worse over the last few days. Nausea and pain exacerbated with feeding and there is no relieving factor.  The pain does not radiate anywhere else.  With regard to his oropharynx  cancer, the right cervical lymph node has shrunk down with chemoradiation therapy.  He denies shortness of breath, chest pain, abdominal discomfort if he is not doing any feeding.  The rest of the 14- point review of systems was negative.  PAST MEDICAL HISTORY: 1. Oropharynx squamous cell carcinoma as noted above. 2. Hyperlipidemia. 3. History of smoking cigar. 4. Hypercholesteremia. 5. Carotid artery disease history, status post bypass in [place], [place]     in 2000. 6. Osteoarthritis.  CURRENT OUTPATIENT MEDICATIONS: 1. Alprazolam. 2. Baclofen. 3. Celecoxib. 4. Keflex. 5. Vicodin. 6. Ativan. 7. Zofran. 8. Percocet. 9. Pantoprazole. 10.Compazine. 11.Rosuvastatin 20 mg p.o. daily. 12.Ambien p.r.n.  PHYSICAL EXAMINATION:  VITAL SIGNS:  Temperature 97.0, heart rate 84, respiratory rate 20, blood pressure 110/64, weight of 183.9 pounds. ECOG performance was of 1. EXAM:  Right cervical neck node was about 2/2 cm with mild edema over the skin at the cervical neck.  The PEG tube showed erythema, purulent discharge, and pain and discomfort with palpation.  General:  well-nourished in no acute distress.  Eyes:  no scleral icterus.  ENT:  There were no oropharyngeal lesions.  Neck was without thyromegaly.  Lymphatics:  Negative for supraclavicular or axillary adenopathy.  Respiratory: lungs were clear  bilaterally without wheezing or crackles.  Cardiovascular:  Regular rate and rhythm, S1/S2, without murmur, rub or gallop.  There was no pedal edema.  GI:  abdomen was soft, flat, nontender, nondistended, without organomegaly.  Muscoloskeletal:  no  spinal tenderness of palpation of vertebral spine.  Skin exam was without echymosis, petichae.  Neuro exam was nonfocal.  Patient was able to get on and off exam table without assistance.  Gait was normal.  Patient was alerted and oriented.  Attention  was good.   Language was appropriate.  Mood was normal without depression.  Speech was not  pressured.  Thought content was not tangential.    LABORATORY DATA:  On June 13, 2011 showed WBC 3.6, hemoglobin 12.1, platelet count of 94,000.  Creatinine 1.28.  LFT within normal limits.  ASSESSMENT AND PLAN: 1. Cellulitis, Pseudomonas:  I discussed with George Bradley and his wife     that given the Pseudomonas, I need to discontinue Keflex and start     him on Zosyn IV in addition to Levaquin oral for double coverage.     I will get blood culture to make sure he is not having sepsis.  I     appreciate surgical evaluation as inpatient to see if any cervical     intervention is needed given clinical concern of abdominal pain and     make sure there is no abscess. 2. Oropharynx cancer.  I will hold chemotherapy until the infection is     under control.  I will advise him to continue with radiation     therapy as previously planned by Radiation Oncology. 3. Calorie protein malnutrition.  This is mild to moderate.  As the     PEG tube is having problem, I will need to hold feeding through the     PEG tube.  It depends on the surgical intervention.  It can either     encourage him to take oral intake or if that does not work then can     try either Panda tube feed or TPN. 4. Hypercholesteremia.  We will resume rosuvastatin. 5. Pain control from the PEG tube site.  We will try morphine sulfate     4 mg IV q.4 h. p.r.n. 6. Nausea and vomiting because of the pain in the tube site.  He has     Compazine and Zofran p.r.n.  CODE STATUS:  Full code.     Exie Parody, M.D.     HTH/MEDQ  D:  06/16/2011  T:  06/16/2011  Job:  [id number]  Electronically Signed by Jethro Bolus MD on 06/21/2011 10:33:20 AM

## 2011-06-22 LAB — COMPREHENSIVE METABOLIC PANEL
ALT: 8 U/L (ref 0–53)
AST: 14 U/L (ref 0–37)
CO2: 26 mEq/L (ref 19–32)
Calcium: 9 mg/dL (ref 8.4–10.5)
Chloride: 104 mEq/L (ref 96–112)
Creatinine, Ser: 1.21 mg/dL (ref 0.50–1.35)
Sodium: 141 mEq/L (ref 135–145)
Total Bilirubin: 0.3 mg/dL (ref 0.3–1.2)
Total Protein: 6.8 g/dL (ref 6.0–8.3)

## 2011-06-22 LAB — CULTURE, BLOOD (ROUTINE X 2)
Culture  Setup Time: 201209242110
Culture: NO GROWTH

## 2011-06-22 LAB — MAGNESIUM: Magnesium: 1.9 mg/dL (ref 1.5–2.5)

## 2011-06-23 ENCOUNTER — Encounter (HOSPITAL_BASED_OUTPATIENT_CLINIC_OR_DEPARTMENT_OTHER): Payer: Medicare Other | Admitting: Oncology

## 2011-06-23 ENCOUNTER — Other Ambulatory Visit: Payer: Self-pay | Admitting: Oncology

## 2011-06-23 DIAGNOSIS — D696 Thrombocytopenia, unspecified: Secondary | ICD-10-CM

## 2011-06-23 DIAGNOSIS — C01 Malignant neoplasm of base of tongue: Secondary | ICD-10-CM

## 2011-06-23 DIAGNOSIS — C490 Malignant neoplasm of connective and soft tissue of head, face and neck: Secondary | ICD-10-CM

## 2011-06-23 DIAGNOSIS — B977 Papillomavirus as the cause of diseases classified elsewhere: Secondary | ICD-10-CM

## 2011-06-23 DIAGNOSIS — Z5111 Encounter for antineoplastic chemotherapy: Secondary | ICD-10-CM

## 2011-06-23 LAB — CBC WITH DIFFERENTIAL/PLATELET
BASO%: 0.4 % (ref 0.0–2.0)
Basophils Absolute: 0 10*3/uL (ref 0.0–0.1)
EOS%: 0.4 % (ref 0.0–7.0)
HCT: 31.4 % — ABNORMAL LOW (ref 38.4–49.9)
HGB: 10.8 g/dL — ABNORMAL LOW (ref 13.0–17.1)
MCH: 31.8 pg (ref 27.2–33.4)
MCHC: 34.4 g/dL (ref 32.0–36.0)
MCV: 92.4 fL (ref 79.3–98.0)
MONO%: 25.3 % — ABNORMAL HIGH (ref 0.0–14.0)
NEUT%: 60.7 % (ref 39.0–75.0)
lymph#: 0.4 10*3/uL — ABNORMAL LOW (ref 0.9–3.3)

## 2011-06-23 LAB — BASIC METABOLIC PANEL
BUN: 21 mg/dL (ref 6–23)
CO2: 29 mEq/L (ref 19–32)
Calcium: 9.4 mg/dL (ref 8.4–10.5)
Chloride: 100 mEq/L (ref 96–112)
Creatinine, Ser: 1.13 mg/dL (ref 0.50–1.35)
Glucose, Bld: 97 mg/dL (ref 70–99)
Potassium: 4.4 mEq/L (ref 3.5–5.3)
Sodium: 137 mEq/L (ref 135–145)
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
## 2011-06-25 ENCOUNTER — Other Ambulatory Visit: Payer: Self-pay | Admitting: Oncology

## 2011-06-25 ENCOUNTER — Encounter (HOSPITAL_BASED_OUTPATIENT_CLINIC_OR_DEPARTMENT_OTHER): Payer: Medicare Other | Admitting: Oncology

## 2011-06-25 DIAGNOSIS — D696 Thrombocytopenia, unspecified: Secondary | ICD-10-CM

## 2011-06-25 DIAGNOSIS — B977 Papillomavirus as the cause of diseases classified elsewhere: Secondary | ICD-10-CM

## 2011-06-25 DIAGNOSIS — Z5111 Encounter for antineoplastic chemotherapy: Secondary | ICD-10-CM

## 2011-06-25 DIAGNOSIS — C01 Malignant neoplasm of base of tongue: Secondary | ICD-10-CM

## 2011-06-25 LAB — BASIC METABOLIC PANEL
CO2: 32 mEq/L (ref 19–32)
Calcium: 9.2 mg/dL (ref 8.4–10.5)
Potassium: 4.2 mEq/L (ref 3.5–5.3)
Sodium: 137 mEq/L (ref 135–145)

## 2011-06-25 LAB — CBC WITH DIFFERENTIAL/PLATELET
BASO%: 0 % (ref 0.0–2.0)
Eosinophils Absolute: 0 10*3/uL (ref 0.0–0.5)
LYMPH%: 15.4 % (ref 14.0–49.0)
MCHC: 34.7 g/dL (ref 32.0–36.0)
MONO#: 0.5 10*3/uL (ref 0.1–0.9)
NEUT#: 0.9 10*3/uL — ABNORMAL LOW (ref 1.5–6.5)
RBC: 3.19 10*6/uL — ABNORMAL LOW (ref 4.20–5.82)
RDW: 13.4 % (ref 11.0–14.6)
WBC: 1.6 10*3/uL — ABNORMAL LOW (ref 4.0–10.3)
lymph#: 0.3 10*3/uL — ABNORMAL LOW (ref 0.9–3.3)

## 2011-06-25 LAB — URINALYSIS, MICROSCOPIC - CHCC
Blood: NEGATIVE
Ketones: NEGATIVE mg/dL
Protein: NEGATIVE mg/dL
Specific Gravity, Urine: 1.005 (ref 1.003–1.035)

## 2011-06-30 ENCOUNTER — Other Ambulatory Visit: Payer: Self-pay | Admitting: Oncology

## 2011-06-30 ENCOUNTER — Encounter (HOSPITAL_BASED_OUTPATIENT_CLINIC_OR_DEPARTMENT_OTHER): Payer: Medicare Other | Admitting: Oncology

## 2011-06-30 DIAGNOSIS — R634 Abnormal weight loss: Secondary | ICD-10-CM

## 2011-06-30 DIAGNOSIS — C01 Malignant neoplasm of base of tongue: Secondary | ICD-10-CM

## 2011-06-30 DIAGNOSIS — D696 Thrombocytopenia, unspecified: Secondary | ICD-10-CM

## 2011-06-30 DIAGNOSIS — B977 Papillomavirus as the cause of diseases classified elsewhere: Secondary | ICD-10-CM

## 2011-06-30 DIAGNOSIS — E440 Moderate protein-calorie malnutrition: Secondary | ICD-10-CM

## 2011-06-30 DIAGNOSIS — C109 Malignant neoplasm of oropharynx, unspecified: Secondary | ICD-10-CM

## 2011-06-30 DIAGNOSIS — Z5111 Encounter for antineoplastic chemotherapy: Secondary | ICD-10-CM

## 2011-06-30 LAB — COMPREHENSIVE METABOLIC PANEL
ALT: 9 U/L (ref 0–53)
AST: 15 U/L (ref 0–37)
Albumin: 4 g/dL (ref 3.5–5.2)
Albumin: 3.6 g/dL (ref 3.5–5.2)
BUN: 21 mg/dL (ref 6–23)
Calcium: 9.2 mg/dL (ref 8.4–10.5)
Calcium: 9.8 mg/dL (ref 8.4–10.5)
Chloride: 101 mEq/L (ref 96–112)
Chloride: 98 mEq/L (ref 96–112)
Glucose, Bld: 114 mg/dL — ABNORMAL HIGH (ref 70–99)
Potassium: 5 mEq/L (ref 3.5–5.3)
Potassium: 4.5 mEq/L (ref 3.5–5.3)
Sodium: 140 mEq/L (ref 135–145)
Total Protein: 6.9 g/dL (ref 6.0–8.3)

## 2011-06-30 LAB — CBC WITH DIFFERENTIAL/PLATELET
Basophils Absolute: 0 10*3/uL (ref 0.0–0.1)
Eosinophils Absolute: 0 10*3/uL (ref 0.0–0.5)
HCT: 33 % — ABNORMAL LOW (ref 38.4–49.9)
HGB: 11.4 g/dL — ABNORMAL LOW (ref 13.0–17.1)
MCV: 93.2 fL (ref 79.3–98.0)
NEUT#: 1.9 10*3/uL (ref 1.5–6.5)
RDW: 14 % (ref 11.0–14.6)
lymph#: 0.4 10*3/uL — ABNORMAL LOW (ref 0.9–3.3)

## 2011-07-01 ENCOUNTER — Ambulatory Visit: Payer: Medicare Other | Attending: Oncology

## 2011-07-01 DIAGNOSIS — R070 Pain in throat: Secondary | ICD-10-CM | POA: Insufficient documentation

## 2011-07-01 DIAGNOSIS — C029 Malignant neoplasm of tongue, unspecified: Secondary | ICD-10-CM | POA: Insufficient documentation

## 2011-07-01 DIAGNOSIS — IMO0001 Reserved for inherently not codable concepts without codable children: Secondary | ICD-10-CM | POA: Insufficient documentation

## 2011-07-03 ENCOUNTER — Encounter: Payer: Self-pay | Admitting: Oncology

## 2011-07-03 DIAGNOSIS — E440 Moderate protein-calorie malnutrition: Secondary | ICD-10-CM | POA: Insufficient documentation

## 2011-07-03 DIAGNOSIS — R634 Abnormal weight loss: Secondary | ICD-10-CM | POA: Insufficient documentation

## 2011-07-25 ENCOUNTER — Other Ambulatory Visit: Payer: Self-pay | Admitting: Oncology

## 2011-07-25 ENCOUNTER — Telehealth: Payer: Self-pay | Admitting: Oncology

## 2011-07-25 ENCOUNTER — Ambulatory Visit (HOSPITAL_BASED_OUTPATIENT_CLINIC_OR_DEPARTMENT_OTHER): Payer: Medicare Other | Admitting: Oncology

## 2011-07-25 VITALS — BP 110/72 | HR 80 | Temp 97.2°F | Ht 71.5 in | Wt 193.0 lb

## 2011-07-25 DIAGNOSIS — C01 Malignant neoplasm of base of tongue: Secondary | ICD-10-CM

## 2011-07-25 DIAGNOSIS — C109 Malignant neoplasm of oropharynx, unspecified: Secondary | ICD-10-CM

## 2011-07-25 DIAGNOSIS — B977 Papillomavirus as the cause of diseases classified elsewhere: Secondary | ICD-10-CM

## 2011-07-25 DIAGNOSIS — Z23 Encounter for immunization: Secondary | ICD-10-CM

## 2011-07-25 DIAGNOSIS — D696 Thrombocytopenia, unspecified: Secondary | ICD-10-CM

## 2011-07-25 DIAGNOSIS — Z5111 Encounter for antineoplastic chemotherapy: Secondary | ICD-10-CM

## 2011-07-25 LAB — CBC WITH DIFFERENTIAL/PLATELET
BASO%: 0.4 % (ref 0.0–2.0)
EOS%: 1.9 % (ref 0.0–7.0)
HCT: 33.9 % — ABNORMAL LOW (ref 38.4–49.9)
MCH: 33.1 pg (ref 27.2–33.4)
MCHC: 34.5 g/dL (ref 32.0–36.0)
NEUT%: 68.3 % (ref 39.0–75.0)
lymph#: 0.7 10*3/uL — ABNORMAL LOW (ref 0.9–3.3)

## 2011-07-25 LAB — COMPREHENSIVE METABOLIC PANEL
AST: 18 U/L (ref 0–37)
Albumin: 4.4 g/dL (ref 3.5–5.2)
Alkaline Phosphatase: 33 U/L — ABNORMAL LOW (ref 39–117)
BUN: 28 mg/dL — ABNORMAL HIGH (ref 6–23)
Creatinine, Ser: 1.27 mg/dL (ref 0.50–1.35)
Potassium: 4.8 mEq/L (ref 3.5–5.3)

## 2011-07-25 NOTE — Telephone Encounter (Signed)
gve the pt's wife the  Jan 2013 appt calendar along with the pet scan appt

## 2011-07-28 ENCOUNTER — Ambulatory Visit: Payer: Medicare Other | Attending: Oncology

## 2011-07-28 DIAGNOSIS — R070 Pain in throat: Secondary | ICD-10-CM | POA: Insufficient documentation

## 2011-07-28 DIAGNOSIS — C029 Malignant neoplasm of tongue, unspecified: Secondary | ICD-10-CM | POA: Insufficient documentation

## 2011-07-28 DIAGNOSIS — IMO0001 Reserved for inherently not codable concepts without codable children: Secondary | ICD-10-CM | POA: Insufficient documentation

## 2011-07-28 NOTE — Progress Notes (Signed)
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  PAST THERAPY:  weekly cisplatin/daily radiation between 04/2011 and 06/2011.   INTERIM HISTORY:  [name] returns to clinic with his wife for followup.  He reports that continuous tube feeds at 75 mL an hour and is doing very well.  His weight is gaining back, approaching normal baseline weight.  He no longer has any problems with PEG tube pain, purulent discharge, or tenderness or erythema.  He has been trying to eat mechanical soft foods such as some soup, however, very small amounts, about 3 to 4 spoons.  He claims that he has no taste whatsoever and they taste horrible and therefore, he has not been trying to eat more by mouth.  However, when he eats by mouth, he has no problem with dysphagia, odynophagia, nausea, or vomiting.  He spends a lot of time at home without doing a lot of activities outside the house.  He denies any fatigue, shortness of breath, chest pain, abdominal pain, abdominal swelling, melena, hematochezia.  He denies any tinnitus or hearing loss.  The rest of his 14-point review of systems was negative.  CURRENT MEDICATIONS:  Unchanged from before.  He is no longer on any fentanyl patch, is only on Vicodin about twice a day and he claims that with Vicodin throughout the day, he has very little thick phlegm in his mouth.  He is still doing mouth rinses with hydrogen peroxide; however, that does not decrease the phlegm as much as taking Vicodin twice a day.  PHYSICAL EXAMINATION:  Vital Signs:  Temperature 97.2, heart rate 80, respiratory rate 20, blood pressure 110/72, weight of 193 pounds.  ECOG performance status of 1.  General:  well-nourished in no acute distress.  Eyes:  no scleral icterus.  ENT:  There were no oropharyngeal lesions.  Neck was without thyromegaly.  Lymphatics:  Negative cervical, supraclavicular or axillary adenopathy.   I could not  palpate any residual cervical adenopathy.   Respiratory: lungs were clear bilaterally without wheezing or crackles.  Cardiovascular:  Regular rate and rhythm, S1/S2, without murmur, rub or gallop.  There was no pedal edema.  GI:  abdomen was soft, flat, nontender, nondistended, without organomegaly.  PEG tube was dry, clean, intact.  Muscoloskeletal:  no spinal tenderness of palpation of vertebral spine.  Skin exam was without echymosis, petichae.  Neuro exam was nonfocal.  Patient was able to get on and off exam table without assistance.  Gait was normal.  Patient was alerted and oriented.  Attention was good.   Language was appropriate.  Mood was normal without depression.  Speech was not pressured.  Thought content was not tangential.    LABORATORIES:  WBC 5.3, hemoglobin 11.7, platelet count 128.   Cr 1.27; LFT within normal limit; Ca 9.7.   ASSESSMENT AND PLAN:   1. Oropharynx squamous cell carcinoma:  I discussed with Mr. Lawes and his wife that he has a very good clinical response.  I ordered for a PET scan in January 2013, which is 3 months from the finish of radiation therapy to assess that disease response to chemoradiation.   2. Slight anemia and thrombocytopenia secondary to recent chemotherapy about 3 or 4 weeks ago.  There is no active bleeding.  There is no need for transfusion.  3. Xerostomia and thick phlegm:  This is from radiation and chemotherapy.  I encouraged him that he should try  to do more mouth rinses.  I do not think that there is any explanation for how  his Vicodin thin out his thick phlegm..  I advised him to taper that within the next 2 months to be off of  pain medications and do more mouth rinses.  4. Mild to moderate calorie protein malnutrition:  I discussed with him that without taste it is difficult to eat, however, he should push himself so that he will have taste coming back sooner and be able to get the feeding tube out in about 2 months.  5. Follow up with me the  day after PET scan in January 2013. 6. Primary care physician:  I discussed with him the pros and cons of influenza vaccinations and he accepted the risks and benefits and we will go ahead with that today.    ______________________________ Exie Parody, M.D. HTH/MEDQ  D:  07/25/2011  T:  07/28/2011  Job:  [id number]

## 2011-07-28 NOTE — Progress Notes (Deleted)
The patient's weight has increased to 193 pounds from 184 pounds on 10/08.  He is using approximately 8 cans of a combination of Osmolite at 1.5 and Jevity 1.5 via a continuous infusion providing approximately 2650 calories, 111 g of protein, 1354 mL of free water.  He is only consuming sips of water and maybe a small amount of apple juice by mouth.  He has tried a little bit of coffee.  He can eat a few bites of soup and a little bit of applesauce.  Other than that he is not consuming foods by mouth.  However, he is interested in trying to find a way to increase his oral intake so that he can decrease his tube feedings.  NUTRITION DIAGNOSIS:  Inadequate oral intake continues.  INTERVENTION:  I have educated both the patient and his wife today on ways to increase oral intake in small amounts throughout the day.  Once he is able to tolerate foods and liquids that have calories in them throughout the day he is going to slowly decrease his continuous feedings and increase his oral intake.  He understands that his goal is to maintain his weight throughout this process.  I have also given him an option of running his continuous feedings 4 cans overnight and then adding 1 can of bolus feeding throughout the day t.i.d. based on his oral intake.  The patient and wife verbalized understanding.  I have given him some options to help food taste more attractive to him.  He and wife are appreciative.  MONITORING, EVALUATION AND GOALS:  The patient has tolerated tube feedings to meet 100% of his estimated needs.  His new goal is that he will gradually increase his oral intake and decrease his tube feedings to maintain his current weight.  Next visit Monday, 12/03.    ______________________________ Nailani Full, RD, LDN Clinical Nutrition Specialist BN/MEDQ  D:  07/25/2011  T:  07/28/2011  Job:  [id number] 

## 2011-07-29 ENCOUNTER — Ambulatory Visit (HOSPITAL_COMMUNITY): Payer: Medicaid - Dental | Admitting: Dentistry

## 2011-07-29 VITALS — BP 117/70 | HR 91 | Temp 97.8°F

## 2011-07-29 DIAGNOSIS — R1310 Dysphagia, unspecified: Secondary | ICD-10-CM

## 2011-07-29 DIAGNOSIS — K036 Deposits [accretions] on teeth: Secondary | ICD-10-CM

## 2011-07-29 DIAGNOSIS — K1230 Oral mucositis (ulcerative), unspecified: Secondary | ICD-10-CM

## 2011-07-29 DIAGNOSIS — K117 Disturbances of salivary secretion: Secondary | ICD-10-CM

## 2011-07-29 DIAGNOSIS — K1233 Oral mucositis (ulcerative) due to radiation: Secondary | ICD-10-CM

## 2011-07-29 DIAGNOSIS — K121 Other forms of stomatitis: Secondary | ICD-10-CM

## 2011-07-29 DIAGNOSIS — R432 Parageusia: Secondary | ICD-10-CM

## 2011-07-29 NOTE — Progress Notes (Signed)
  BP: 117/70     P: 91         T: 97.8            Wgt: 180 lbs now 183 lbs at start. George Bradley is a 70 year old man with a history of squamous cell carcinoma the base of tongue. Patient underwent chemoradiation therapy. This was from 05/13/2011 through 07/04/2011.  Patient now presents for periodic oral exam status post radiation therapy approximately one month.   REVIEW OF CHIEF COMPLAINTS: DRY MOUTH: Yes. HARD TO SWALLOW: NO HURT TO SWALLOW: Sometimes TASTE CHANGES: Taste is coming back a little. SORES IN MOUTH: No TRISMUS: Having no problems with trismus. SYMPTOM RELIEF:  Using salt water and Biotene Rinses. HOME OH REGIMEN:  BRUSHING: 2 X a day FLOSSING: 1 X a day RINSING: see above FLUORIDE: Doing fluoride at night having no problems. TRISMUS EXERCISES: MIO:  45 mm NEW RX'S: No  DENTAL EXAM: OH (PLAQUE): Minimal plaque accumulations. Patient instructed to brush his tongue twice daily. LOCATION OF MUCOSITIS: Generalized the palatal erythema. Back of throat is slightly erythematous as well. DESCRIPTION OF SALIVA: Decreased. Ropey saliva. Moderate xerostomia. ANY EXPOSED BONE: None noted OTHER WATCHED AREAS: Tooth #30 need to crown. Tooth #4 needs to be followed for periapical pathology. DX:   1. Xerostomia  2. Dysgeusia 3. Dysphagia 4. Odynophagia 5.  Accretions 6.  Mucositis  RECOMMENDATIONS: 1. Brush after meals and at bedtime. Use fluoride at bedtime. Brush tongue twice daily. 2. Use trismus exercises as directed. 3. Use Biotene Rinse or salt water/baking soda rinses. 4. Multiple sips of water as needed. 5. Schedule followup examination with his primary dentist in 2 months. (Dr. Lamont Dowdy). Call if problems before then. Dr. Cindra Eves

## 2011-08-01 NOTE — Progress Notes (Deleted)
The patient's weight has increased to 193 pounds from 184 pounds on 10/08.  He is using approximately 8 cans of a combination of Osmolite at 1.5 and Jevity 1.5 via a continuous infusion providing approximately 2650 calories, 111 g of protein, 1354 mL of free water.  He is only consuming sips of water and maybe a small amount of apple juice by mouth.  He has tried a little bit of coffee.  He can eat a few bites of soup and a little bit of applesauce.  Other than that he is not consuming foods by mouth.  However, he is interested in trying to find a way to increase his oral intake so that he can decrease his tube feedings.  NUTRITION DIAGNOSIS:  Inadequate oral intake continues.  INTERVENTION:  I have educated both the patient and his wife today on ways to increase oral intake in small amounts throughout the day.  Once he is able to tolerate foods and liquids that have calories in them throughout the day he is going to slowly decrease his continuous feedings and increase his oral intake.  He understands that his goal is to maintain his weight throughout this process.  I have also given him an option of running his continuous feedings 4 cans overnight and then adding 1 can of bolus feeding throughout the day t.i.d. based on his oral intake.  The patient and wife verbalized understanding.  I have given him some options to help food taste more attractive to him.  He and wife are appreciative.  MONITORING, EVALUATION AND GOALS:  The patient has tolerated tube feedings to meet 100% of his estimated needs.  His new goal is that he will gradually increase his oral intake and decrease his tube feedings to maintain his current weight.  Next visit Monday, 12/03.    ______________________________ Zenovia Jarred, RD, LDN Clinical Nutrition Specialist BN/MEDQ  D:  07/25/2011  T:  07/28/2011  Job:  [id number]

## 2011-08-04 NOTE — Progress Notes (Signed)
The patient's weight has increased to 193 pounds from 184 pounds on 10/08.  He is using approximately 8 cans of a combination of Osmolite at 1.5 and Jevity 1.5 via a continuous infusion providing approximately 2650 calories, 111 g of protein, 1354 mL of free water.  He is only consuming sips of water and maybe a small amount of apple juice by mouth.  He has tried a little bit of coffee.  He can eat a few bites of soup and a little bit of applesauce.  Other than that he is not consuming foods by mouth.  However, he is interested in trying to find a way to increase his oral intake so that he can decrease his tube feedings.  NUTRITION DIAGNOSIS:  Inadequate oral intake continues.  INTERVENTION:  I have educated both the patient and his wife today on ways to increase oral intake in small amounts throughout the day.  Once he is able to tolerate foods and liquids that have calories in them throughout the day he is going to slowly decrease his continuous feedings and increase his oral intake.  He understands that his goal is to maintain his weight throughout this process.  I have also given him an option of running his continuous feedings 4 cans overnight and then adding 1 can of bolus feeding throughout the day t.i.d. based on his oral intake.  The patient and wife verbalized understanding.  I have given him some options to help food taste more attractive to him.  He and wife are appreciative.  MONITORING, EVALUATION AND GOALS:  The patient has tolerated tube feedings to meet 100% of his estimated needs.  His new goal is that he will gradually increase his oral intake and decrease his tube feedings to maintain his current weight.  Next visit Monday, 12/03.    ______________________________ Ahtziri Jeffries, RD, LDN Clinical Nutrition Specialist BN/MEDQ  D:  07/25/2011  T:  07/28/2011  Job:  [id number] 

## 2011-08-04 NOTE — Progress Notes (Deleted)
The patient's weight has increased to 193 pounds from 184 pounds on 10/08.  He is using approximately 8 cans of a combination of Osmolite at 1.5 and Jevity 1.5 via a continuous infusion providing approximately 2650 calories, 111 g of protein, 1354 mL of free water.  He is only consuming sips of water and maybe a small amount of apple juice by mouth.  He has tried a little bit of coffee.  He can eat a few bites of soup and a little bit of applesauce.  Other than that he is not consuming foods by mouth.  However, he is interested in trying to find a way to increase his oral intake so that he can decrease his tube feedings.  NUTRITION DIAGNOSIS:  Inadequate oral intake continues.  INTERVENTION:  I have educated both the patient and his wife today on ways to increase oral intake in small amounts throughout the day.  Once he is able to tolerate foods and liquids that have calories in them throughout the day he is going to slowly decrease his continuous feedings and increase his oral intake.  He understands that his goal is to maintain his weight throughout this process.  I have also given him an option of running his continuous feedings 4 cans overnight and then adding 1 can of bolus feeding throughout the day t.i.d. based on his oral intake.  The patient and wife verbalized understanding.  I have given him some options to help food taste more attractive to him.  He and wife are appreciative.  MONITORING, EVALUATION AND GOALS:  The patient has tolerated tube feedings to meet 100% of his estimated needs.  His new goal is that he will gradually increase his oral intake and decrease his tube feedings to maintain his current weight.  Next visit Monday, 12/03.    ______________________________ Abelardo Seidner, RD, LDN Clinical Nutrition Specialist BN/MEDQ  D:  07/25/2011  T:  07/28/2011  Job:  [id number] 

## 2011-08-11 ENCOUNTER — Ambulatory Visit
Admission: RE | Admit: 2011-08-11 | Discharge: 2011-08-11 | Disposition: A | Discharge status: Home / Self Care | Payer: Medicare Other | Source: Ambulatory Visit | Attending: Radiation Oncology | Admitting: Radiation Oncology

## 2011-08-11 DIAGNOSIS — C109 Malignant neoplasm of oropharynx, unspecified: Secondary | ICD-10-CM

## 2011-08-11 DIAGNOSIS — Z86718 Personal history of other venous thrombosis and embolism: Secondary | ICD-10-CM | POA: Insufficient documentation

## 2011-08-11 DIAGNOSIS — Z7901 Long term (current) use of anticoagulants: Secondary | ICD-10-CM | POA: Insufficient documentation

## 2011-08-11 DIAGNOSIS — M329 Systemic lupus erythematosus, unspecified: Secondary | ICD-10-CM | POA: Insufficient documentation

## 2011-08-11 MED ORDER — BIAFINE EX EMUL
CUTANEOUS | Status: DC | PRN
  Administered 2011-08-11: 1 via TOPICAL

## 2011-08-11 NOTE — Progress Notes (Signed)
CC:   Suzanna Obey, M.D. Exie Parody, M.D.  DIAGNOSIS:  Stage IV base of tongue carcinoma.  INTERVAL SINCE RADIATION THERAPY:  One month.  NARRATIVE:  George Bradley comes in today for routine followup.  He clinically seems to be making good progress.  The patient is taking in soft foods.  He has minimal odynophagia at this time.  The patient does have some dysphagia for solid foods.  The patient's energy level continues to improve.  He does continue use his gastrostomy feeding tube as needed for weight and nutritional issues.  The patient is scheduled for a PET scan in January.  PHYSICAL EXAMINATION:  Vital signs: The patient's temperature is 97. Pulse is 84, respirations 20, and blood pressure is 115/77.  Weight is 194 pounds, which is stable over the past 3 or 4 weeks.  Examination of the lungs reveals them to be clear.  The heart has regular rhythm and rate.  The abdomen is soft and nontender with normal bowel sounds. Examination of the neck area reveals some thickening in the right level 2 region but no discrete adenopathy is appreciated.  Overall, there has been no significant improvement prior to his pre therapy exam. Examination of the oral cavity reveals mucosa to be mildly dry.  There is no secondary infection noted in the oral cavity or mucosal lesions. The patient has some mild mucositis present.  IMPRESSION AND PLAN:  Stage IV base of tongue carcinoma.  The patient is making satisfactory improvement after his combined modality therapy.  As above, the patient will undergo a PET scan January and will follow up in Radiation Oncology in February.  I have also encouraged the patient to follow up with Dr. Jearld Fenton for regular EENT exams.    ______________________________ George Bradley, Ph.D., M.D. JDK/MEDQ  D:  08/11/2011  T:  08/11/2011  Job:  [id number]

## 2011-08-11 NOTE — Progress Notes (Signed)
Pt here for follow up, states he eating oatmeal,scrambled eggs, sips coffee, water, also still using peg tube daily, vitals wnl, pt states only med he is using is vicodin for sleep only not for pain, biafine cream given per Dr. Roselind Messier for pt neck,  11:35 AM

## 2011-08-25 ENCOUNTER — Ambulatory Visit: Payer: Medicare Other | Admitting: Nutrition

## 2011-08-29 NOTE — Progress Notes (Signed)
George Bradley returns for nutrition followup with his wife.  He continues to use approximately 6 to 7 cans of a combination of Osmolite 1.5 and Jevity 1.5 daily.  He is able to eat small amounts at breakfast, lunch and dinner, of a variety of foods including oatmeal, eggs, English muffins, chicken salad; really a soft diet.  The patient does complain of feeling full early, which is limiting his oral intake.  He does use bolus feedings throughout the day, which might be contributing to his early satiety at mealtimes.  Weight is stable, with his last weight documented as 194 pounds on November 19th.  The patient's goal is to have the feeding tube removed after his next visit with Dr. Gaylyn Rong in January.  NUTRITION DIAGNOSIS:  Inadequate oral intake continues.  INTERVENTION:  I have encouraged the patient to substitute an oral nutritional beverage such as Ensure Plus or Carnation Very High Calorie in place of his bolus feedings throughout the day.  As he increases his oral intake of not only food but nutritional supplements, he can decrease the tube feedings.  I have given him guidelines for how many cans of each product he would need to consume, instructions on how to decrease his bolus feedings, as well as his continual nocturnal feedings.  I have also encouraged the patient to continue to choose a variety of foods at meals to expand his options.  MONITORING, EVALUATION AND GOALS:  The patient is gradually increasing his oral intake and maintaining his weight.  He will work to decrease his tube feedings by increasing his oral intake, while maintaining his present weight.  NEXT VISIT:  Wednesday, January 9th.    ______________________________ Zenovia Jarred, RD, LDN Clinical Nutrition Specialist BN/MEDQ  D:  08/25/2011  T:  08/25/2011  Job:  [id number]

## 2011-09-03 ENCOUNTER — Encounter (HOSPITAL_COMMUNITY): Payer: Self-pay | Admitting: Adult Health

## 2011-09-03 ENCOUNTER — Emergency Department (HOSPITAL_COMMUNITY): Payer: Medicare Other

## 2011-09-03 ENCOUNTER — Inpatient Hospital Stay (HOSPITAL_COMMUNITY)
Admission: EM | Admit: 2011-09-03 | Discharge: 2011-09-07 | DRG: 394 | Disposition: A | Discharge status: Home / Self Care | Payer: Medicare Other | Attending: Internal Medicine | Admitting: Internal Medicine

## 2011-09-03 ENCOUNTER — Telehealth: Payer: Self-pay | Admitting: *Deleted

## 2011-09-03 ENCOUNTER — Telehealth (INDEPENDENT_AMBULATORY_CARE_PROVIDER_SITE_OTHER): Payer: Self-pay | Admitting: General Surgery

## 2011-09-03 DIAGNOSIS — I350 Nonrheumatic aortic (valve) stenosis: Secondary | ICD-10-CM | POA: Diagnosis present

## 2011-09-03 DIAGNOSIS — R109 Unspecified abdominal pain: Secondary | ICD-10-CM

## 2011-09-03 DIAGNOSIS — C109 Malignant neoplasm of oropharynx, unspecified: Secondary | ICD-10-CM | POA: Diagnosis present

## 2011-09-03 DIAGNOSIS — I82409 Acute embolism and thrombosis of unspecified deep veins of unspecified lower extremity: Secondary | ICD-10-CM

## 2011-09-03 DIAGNOSIS — I359 Nonrheumatic aortic valve disorder, unspecified: Secondary | ICD-10-CM | POA: Diagnosis present

## 2011-09-03 DIAGNOSIS — Z66 Do not resuscitate: Secondary | ICD-10-CM | POA: Diagnosis present

## 2011-09-03 DIAGNOSIS — D6869 Other thrombophilia: Secondary | ICD-10-CM | POA: Diagnosis present

## 2011-09-03 DIAGNOSIS — K551 Chronic vascular disorders of intestine: Secondary | ICD-10-CM

## 2011-09-03 DIAGNOSIS — Z951 Presence of aortocoronary bypass graft: Secondary | ICD-10-CM

## 2011-09-03 DIAGNOSIS — E440 Moderate protein-calorie malnutrition: Secondary | ICD-10-CM | POA: Diagnosis present

## 2011-09-03 DIAGNOSIS — I209 Angina pectoris, unspecified: Secondary | ICD-10-CM | POA: Diagnosis present

## 2011-09-03 DIAGNOSIS — R1033 Periumbilical pain: Secondary | ICD-10-CM

## 2011-09-03 DIAGNOSIS — K55059 Acute (reversible) ischemia of intestine, part and extent unspecified: Principal | ICD-10-CM | POA: Diagnosis present

## 2011-09-03 DIAGNOSIS — E7800 Pure hypercholesterolemia, unspecified: Secondary | ICD-10-CM | POA: Diagnosis present

## 2011-09-03 DIAGNOSIS — R112 Nausea with vomiting, unspecified: Secondary | ICD-10-CM | POA: Diagnosis present

## 2011-09-03 DIAGNOSIS — Z931 Gastrostomy status: Secondary | ICD-10-CM

## 2011-09-03 DIAGNOSIS — Z87891 Personal history of nicotine dependence: Secondary | ICD-10-CM

## 2011-09-03 DIAGNOSIS — I701 Atherosclerosis of renal artery: Secondary | ICD-10-CM | POA: Diagnosis present

## 2011-09-03 LAB — COMPREHENSIVE METABOLIC PANEL
ALT: 15 U/L (ref 0–53)
CO2: 25 mEq/L (ref 19–32)
Calcium: 10.8 mg/dL — ABNORMAL HIGH (ref 8.4–10.5)
Chloride: 101 mEq/L (ref 96–112)
Creatinine, Ser: 1.08 mg/dL (ref 0.50–1.35)
GFR calc Af Amer: 78 mL/min — ABNORMAL LOW (ref >90)
GFR calc non Af Amer: 68 mL/min — ABNORMAL LOW (ref >90)
Glucose, Bld: 99 mg/dL (ref 70–99)
Total Bilirubin: 0.5 mg/dL (ref 0.3–1.2)

## 2011-09-03 LAB — URINALYSIS, ROUTINE W REFLEX MICROSCOPIC
Bilirubin Urine: NEGATIVE
Nitrite: NEGATIVE
Protein, ur: NEGATIVE mg/dL
Specific Gravity, Urine: 1.023 (ref 1.005–1.030)
Urobilinogen, UA: 0.2 mg/dL (ref 0.0–1.0)

## 2011-09-03 LAB — DIFFERENTIAL
Eosinophils Relative: 0 % (ref 0–5)
Lymphocytes Relative: 13 % (ref 12–46)
Lymphs Abs: 0.9 10*3/uL (ref 0.7–4.0)
Monocytes Absolute: 0.9 10*3/uL (ref 0.1–1.0)
Neutro Abs: 5 10*3/uL (ref 1.7–7.7)

## 2011-09-03 LAB — LIPASE, BLOOD: Lipase: 15 U/L (ref 11–59)

## 2011-09-03 LAB — CBC
HCT: 38.7 % — ABNORMAL LOW (ref 39.0–52.0)
Hemoglobin: 13.5 g/dL (ref 13.0–17.0)
MCV: 99.2 fL (ref 78.0–100.0)
RBC: 3.9 MIL/uL — ABNORMAL LOW (ref 4.22–5.81)
WBC: 6.7 10*3/uL (ref 4.0–10.5)

## 2011-09-03 MED ORDER — DEXTROSE-NACL 5-0.9 % IV SOLN
INTRAVENOUS | Status: DC
  Administered 2011-09-04 – 2011-09-07 (×4): via INTRAVENOUS

## 2011-09-03 MED ORDER — IOHEXOL 300 MG/ML  SOLN
100.0000 mL | Freq: Once | INTRAMUSCULAR | Status: AC | PRN
  Administered 2011-09-03: 100 mL via INTRAVENOUS

## 2011-09-03 MED ORDER — HEPARIN SOD (PORCINE) IN D5W 100 UNIT/ML IV SOLN
14.0000 [IU]/kg/h | INTRAVENOUS | Status: DC
  Administered 2011-09-04: 14 [IU]/kg/h via INTRAVENOUS
  Filled 2011-09-03: qty 250

## 2011-09-03 MED ORDER — ASPIRIN EC 81 MG PO TBEC
81.0000 mg | DELAYED_RELEASE_TABLET | Freq: Every day | ORAL | Status: DC
  Administered 2011-09-05 – 2011-09-07 (×3): 81 mg via ORAL
  Filled 2011-09-03 (×5): qty 1

## 2011-09-03 MED ORDER — HEPARIN BOLUS VIA INFUSION
2500.0000 [IU] | Freq: Once | INTRAVENOUS | Status: AC
  Administered 2011-09-04: 2500 [IU] via INTRAVENOUS
  Filled 2011-09-03: qty 2500

## 2011-09-03 MED ORDER — HYDROMORPHONE HCL PF 1 MG/ML IJ SOLN
2.0000 mg | INTRAMUSCULAR | Status: DC | PRN
  Administered 2011-09-04 – 2011-09-06 (×8): 2 mg via INTRAVENOUS
  Filled 2011-09-03 (×2): qty 1
  Filled 2011-09-03 (×3): qty 2
  Filled 2011-09-03: qty 1
  Filled 2011-09-03: qty 2
  Filled 2011-09-03: qty 1
  Filled 2011-09-03 (×2): qty 2

## 2011-09-03 MED ORDER — HYDROMORPHONE HCL PF 1 MG/ML IJ SOLN
1.0000 mg | Freq: Once | INTRAMUSCULAR | Status: AC
  Administered 2011-09-03: 1 mg via INTRAVENOUS
  Filled 2011-09-03: qty 1

## 2011-09-03 MED ORDER — ONDANSETRON HCL 4 MG/2ML IJ SOLN
4.0000 mg | Freq: Four times a day (QID) | INTRAMUSCULAR | Status: DC | PRN
  Administered 2011-09-04 – 2011-09-06 (×8): 4 mg via INTRAVENOUS
  Filled 2011-09-03 (×8): qty 2

## 2011-09-03 MED ORDER — SODIUM CHLORIDE 0.9 % IV BOLUS (SEPSIS)
500.0000 mL | Freq: Once | INTRAVENOUS | Status: AC
  Administered 2011-09-03: 500 mL via INTRAVENOUS

## 2011-09-03 MED ORDER — LORAZEPAM 2 MG/ML IJ SOLN
1.0000 mg | Freq: Once | INTRAMUSCULAR | Status: AC
  Administered 2011-09-03: 1 mg via INTRAVENOUS
  Filled 2011-09-03: qty 1

## 2011-09-03 MED ORDER — ONDANSETRON HCL 4 MG PO TABS: 4.0000 mg | ORAL_TABLET | Freq: Four times a day (QID) | ORAL | Status: DC | PRN

## 2011-09-03 MED ORDER — ONDANSETRON HCL 4 MG/2ML IJ SOLN
4.0000 mg | Freq: Once | INTRAMUSCULAR | Status: AC
  Administered 2011-09-03: 4 mg via INTRAVENOUS
  Filled 2011-09-03: qty 2

## 2011-09-03 MED ORDER — IOHEXOL 350 MG/ML SOLN
100.0000 mL | Freq: Once | INTRAVENOUS | Status: AC | PRN
  Administered 2011-09-03: 80 mL via INTRAVENOUS

## 2011-09-03 MED ORDER — SODIUM CHLORIDE 0.9 % IV SOLN
INTRAVENOUS | Status: DC
  Administered 2011-09-03 (×2): via INTRAVENOUS

## 2011-09-03 MED ORDER — ACETAMINOPHEN 325 MG PO TABS: 650.0000 mg | ORAL_TABLET | Freq: Four times a day (QID) | ORAL | Status: DC | PRN

## 2011-09-03 MED ORDER — ACETAMINOPHEN 650 MG RE SUPP: 650.0000 mg | Freq: Four times a day (QID) | RECTAL | Status: DC | PRN

## 2011-09-03 NOTE — Telephone Encounter (Signed)
Myrlene Broker called reporting patient has been weaning off PEG feedings and trying to eat table foods.  He finished chemo and RT mid October and has been recooperating.  His abdomen is firm and splinting abd. pain when touched for the last few days.  "We've called the doctor who placed the PEG tube with no return call so we're on our way to the ER.  Does Dr. Gaylyn Rong need to call the ER?"    Informed Claudia the ER MD will evaluate him and call the appropriate provider as needed.  ER may find they need call GI, PCP.  [name]

## 2011-09-03 NOTE — ED Notes (Signed)
Pt for admit

## 2011-09-03 NOTE — Telephone Encounter (Signed)
Patient's wife calls s/p Peg tube placement on 05/07/11 complaining of ongoing abdominal problems. States current episode has been going on about 3 days. He is having trouble tolerating food and feels a tightness in his abdomen. Unable to tolerate all the nutritional supplement he needs. Not vomiting. No fever. Not constipated. Tube is functionally working, just having a lot of abdominal discomfort. She spoke with Dr Gerilyn Nestle, his gastroenterologist, who advised her to contact us first. Please advise.

## 2011-09-03 NOTE — ED Notes (Signed)
Pt transferred from ed to tcu 32, lab currently at bedside drawing coags so heparin can be started, currently patient resting comfortable, no complaints at t his time

## 2011-09-03 NOTE — ED Notes (Addendum)
admission in progress

## 2011-09-03 NOTE — ED Notes (Addendum)
REPORTS ABDOMINAL PAIN AND DISTENTION OVER THE LAST THREE DAYS. PT HAS A PEG-TUBE. sTAES HE IS TENDER TO PALPATION OVER ENTIRE ABDOMEN AND MORE TENDER AROUND PEG TUBE SITE. DENIES VOMITTING, C/O NAUSEA.

## 2011-09-03 NOTE — ED Notes (Signed)
Pt has port-a-cath but request that it not be accessed unless absolutely necessary.

## 2011-09-03 NOTE — ED Provider Notes (Signed)
History     CSN: [id number] Arrival date & time: 09/03/2011  3:29 PM   First MD Initiated Contact with Patient 09/03/11 1558      Chief Complaint  Patient presents with  . Abdominal Pain    (Consider location/radiation/quality/duration/timing/severity/associated sxs/prior treatment) Patient is a 70 y.o. male presenting with abdominal pain. The history is provided by the patient.  Abdominal Pain The primary symptoms of the illness include abdominal pain. The current episode started more than 2 days ago. The onset of the illness was gradual (He had problems with the PEG tube since it was installed, several months ago, and one time it was infected. That was treated with local wound care.). The problem has not changed since onset. Associated with: The pain is caused by eating or instilling feedings through his PEG tube. Symptoms associated with the illness do not include chills, heartburn, urgency, hematuria or back pain. Significant associated medical issues do not include PUD, inflammatory bowel disease or gallstones.   The pain prevents him from eating, and he is feeling weaker because of that. He had a bowel movement yesterday, but has not passed gas today. He is not complaining of distended abdomen when seen in ED. He is status post throat cancer, treated with radiation and chemotherapy. He has not had radiation to the chest or abdomen.  Past Medical History  Diagnosis Date  . Ischemic heart disease   . Aortic stenosis, mild   . Hyperlipidemia   . Insomnia   . Fatigue   . Dizziness   . Hypotension     orthostatic  . History of echocardiogram 02/09/2009    EF -  55-60%  . History of cardiovascular stress test 03/14/2009    EF - 57%   /  mild anterior hyperkinesia with discrete anterior scar consistent with previous infarction but unchanged from previous studies.  Overall, stable findings -- Colleen Can. Deborah Chalk, MD  . Weakness   . ASCVD (arteriosclerotic cardiovascular disease)   .  Arrhythmia     known history of  . Weight loss, abnormal 2012  . 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 Protein-calorie malnutrition, moderate 2012    on PEG tube feeding  . Cellulitis of artificial external opening, post-operative 2012    of PEG tube site    Past Surgical History  Procedure Date  . 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 Cardiac catheterization 2007    EF - 55%  . Ablation of dysrhythmic focus   . Esophagogastroduodenoscopy 04/24/2011    Procedure: ESOPHAGOGASTRODUODENOSCOPY (EGD);  Surgeon: Malissa Hippo, MD;  Location: [facility];  Service: Endoscopy;  Laterality: N/A;  8:30 am  . Peg placement 04/24/2011    Procedure: PERCUTANEOUS ENDOSCOPIC GASTROSTOMY (PEG) PLACEMENT;  Surgeon: Malissa Hippo, MD;  Location: [facility];  Service: Endoscopy;  Laterality: N/A;    Family History  Problem Relation Age of Onset  . Heart disease Father   . Heart disease Brother   . Heart disease Sister   . Cancer Sister     breast cancer  . Cancer Mother     breast ca    History  Substance Use Topics  . Smoking status: Former Smoker    Types: Cigars  . Smokeless tobacco: Former Neurosurgeon    Quit date: 05/06/2011   Comment: 2 cigars a week  . Alcohol Use: Yes     occasionally glass of wine      Review of Systems  Constitutional:  Negative for chills.  Gastrointestinal: Positive for abdominal pain. Negative for heartburn.  Genitourinary: Negative for urgency and hematuria.  Musculoskeletal: Negative for back pain.    Allergies  Review of patient's allergies indicates no known allergies.  Home Medications   Current Outpatient Rx  Name Route Sig Dispense Refill  . ASPIRIN EC 81 MG PO TBEC Oral Take 81 mg by mouth daily.      Marland Kitchen DIPHENHYDRAMINE-APAP (SLEEP) 25-500 MG PO TABS Oral Take 0.5-1 tablets by mouth at bedtime as needed. For sleep.    Marland Kitchen HYDROCODONE-ACETAMINOPHEN 7.5-500 MG/15ML PO SOLN Oral Take 15 mLs by mouth every 4 (four) hours as needed. For pain.     Marland Kitchen MELATONIN PO Oral Take 1 tablet by mouth daily.      Marland Kitchen PANTOPRAZOLE SODIUM 40 MG PO TBEC Oral Take 1 tablet (40 mg total) by mouth daily. 30 tablet 5  . ROSUVASTATIN CALCIUM 20 MG PO TABS Oral Take 20 mg by mouth daily.      Marland Kitchen ZOLPIDEM TARTRATE 10 MG PO TABS Oral Take 5-10 mg by mouth at bedtime as needed. For sleep.      BP 141/69  Pulse 65  Temp(Src) 98.5 F (36.9 C) (Oral)  Resp 18  SpO2 99%  Physical Exam  Constitutional: He is oriented to person, place, and time. He appears well-developed and well-nourished.  HENT:  Head: Normocephalic and atraumatic.  Right Ear: External ear normal.  Left Ear: External ear normal.  Eyes: Conjunctivae and EOM are normal. Pupils are equal, round, and reactive to light.  Neck: Normal range of motion. Neck supple.  Cardiovascular: Normal rate and regular rhythm.   Pulmonary/Chest: Breath sounds normal.  Abdominal: Soft. Bowel sounds are normal. He exhibits no distension and no mass. There is tenderness (moderate, upper, bilat.). There is no rebound and no guarding.       There is a PEG in the left upper quadrant. The site of entry (of the PEG) into the abdomen appears somewhat inflamed however, there is no discharge, fluctuance or bleeding, at this site. There is no localized mass or swelling at the left upper quadrant.  Neurological: He is alert and oriented to person, place, and time.  Skin: Skin is warm and dry.  Psychiatric: He has a normal mood and affect. His behavior is normal. Judgment and thought content normal.    ED Course  Procedures (including critical care time)  Labs Reviewed  CBC - Abnormal; Notable for the following:    RBC 3.90 (*)    HCT 38.7 (*)    MCH 34.6 (*)    All other components within normal limits  DIFFERENTIAL - Abnormal; Notable for the following:    Monocytes Relative 13 (*)    All other components within normal limits  COMPREHENSIVE METABOLIC PANEL - Abnormal; Notable for the following:    Calcium 10.8  (*)    Alkaline Phosphatase 36 (*)    GFR calc non Af Amer 68 (*)    GFR calc Af Amer 78 (*)    All other components within normal limits  URINALYSIS, ROUTINE W REFLEX MICROSCOPIC - Abnormal; Notable for the following:    Ketones, ur TRACE (*)    All other components within normal limits  LIPASE, BLOOD  URINE CULTURE   Ct Angio Abdomen W/cm &/or Wo Contrast  09/03/2011  *RADIOLOGY REPORT*  Clinical Data:  Vascular pathology right lower abdomen.  Assess for arterial versus venous.  Abdominal pain.  CT ANGIOGRAPHY ABDOMEN  Technique:  Multidetector CT imaging of the abdomen was performed using the standard protocol during bolus administration of intravenous contrast.  Multiplanar reconstructed images including MIPs were obtained and reviewed to evaluate the vascular anatomy.  Contrast: 80mL OMNIPAQUE IOHEXOL 350 MG/ML IV SOLN  Comparison:  Same day  Findings:  The examination shows conclusively that the abnormal vessel represents a branch of the superior mesenteric vein rather than the superior mesenteric artery.  Being an arterial phase exam, I cannot assess with certainty the extent of the venous thrombosis but I do not that it involves the main superior mesenteric vein but only the branch vessel.  The aorta shows atherosclerotic change but no aneurysm.  Celiac origin is widely patent.  There is atherosclerotic plaque at the superior mesenteric artery origin but I do not think there is a flow-limiting stenosis.  I cannot identify a patent inferior mesenteric artery.  There is atherosclerotic plaque at both renal artery origins.  On the left, there could possibly be a flow limiting stenosis.  I would estimate this at 50-70%.  On the right, there could also be a flow limiting stenosis estimated at 50%.  The only other parenchymal finding that was not seen or appreciated on the initial scan is the presence of some small mesenteric lymph nodes, none larger than 4 mm in size.  These appear normally  distributed and I think are normal structures.  Review of the MIP images confirms the above findings.  IMPRESSION: Abdominal CT angiography demonstrates that the abnormal vessel extending in the right lower quadrant mesentery is a vein rather than the artery.  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  Bilateral renal artery stenoses estimated at 50-70%.  Original Report Authenticated By: Thomasenia Sales, M.D.   Ct Abdomen Pelvis W Contrast  09/03/2011  *RADIOLOGY REPORT*  Clinical Data: Abdominal pain.  CT ABDOMEN AND PELVIS WITH CONTRAST  Technique:  Multidetector CT imaging of the abdomen and pelvis was performed following the standard protocol during bolus administration of intravenous contrast.  Contrast: OMNIPAQUE IOHEXOL 300 MG/ML IV SOLN  Comparison: None.  Findings: Lung bases are clear.  No pleural or pericardial fluid.  The liver appears normal.  No calcified gallstones.  The spleen is normal.  The pancreas is normal.  The adrenal glands are normal. Both kidneys are normal.  The aorta shows atherosclerotic change but no aneurysm.  The IVC is normal.  No retroperitoneal mass or adenopathy.  No free intraperitoneal fluid or air.  Peg tube has good appearance.  There is some stranding around branches of the mesenteric vessels extending towards the right lower quadrant. I think this could represent branch vessel superior mesenteric vein thrombosis or embolus or thrombosis of a superior mesenteric arterial branch.  There is no portal vein thrombosis.  No primary bowel pathology is discernible.  There is a normal appearing appendix.  No free fluid in the pelvis.  Bladder appears normal. Ordinary degenerative changes present in the lower lumbar spine.  IMPRESSION: Abnormal mesenteric vessels extending towards the right lower quadrant.  I think the differential diagnosis is branch vessels appear mesenteric vein thrombosis versus branch vessel arterial thrombosis or embolus.   I think it is difficult to be absolutely certain which we are dealing with.  Certainly there is no evidence of bowel infarction or edema.  Original Report Authenticated By: Thomasenia Sales, M.D.     1. Abdominal  pain, other specified site   2. Abdominal angina   3. Deep vein  thrombosis       MDM  Venous thrombosis, abdomen, mesenteric vein. No apparent obstruction of gastrointestinal tract. No apparent bowel ischemia. Patient is to be admitted for bowel rest and further evaluation. He'll likely need to be anticoagulated pending further workup and treatment.        Flint Melter, MD 09/03/11 2128

## 2011-09-03 NOTE — ED Notes (Addendum)
Pt. Aware of the need for a urine specimen.  Stated that he can't provide on currently.  Urinal at bedside.  Will continue to monitor.

## 2011-09-03 NOTE — H&P (Signed)
PCP:   Gwen Pounds, MD, MD   Chief Complaint:  Abd Pain  HPI: Patient is a 70 yo white male w/past med history of ischemic heart disease who just recently completed treatment for oropharyngeal cancer who comes in complaining of abdominal pain.  For the past 3 days, the patient has had continuous 9-10/10 abdominal pain located below mid-umbilicus region.  It has not been associated with food (either tube feeds or liquid shake by po).  There is no relief.  He has had minimal vomiting. No other complaints.  He came in to the ER as this was not improving.  Lab work was normal, so patient underwent a CT of the abdomen & pelvis. The findings noted Abnormal mesenteric vessels extending towards the right lower  Quadrant with the differential diagnosis is branch vessel mesenteric vein thrombosis versus branch vessel arterial thrombosis or embolus.   General surgery was consulted who reviewed the initial CT and recommended discussion with Vascular Surgery.  Vascular surgery recommended a CT angio of the abd/pelvis with arterial phase.  This noted that the abnormal vessel  extending in the right lower quadrant mesentery is a vein rather than the artery. 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   On a side note, Bilateral renal artery stenoses estimated at 50-70% were noted.  Review of Systems:  When I saw the patient, his main complaints were of continued abd pain in the area as described above, moderate nausea and feeling slightly anxious.  No headache, vision changes, SOB, palpitations, chest pain, wheeze, cough, hematuria, dysuria, constipation, diarrhea, focal ext numbness, weakness or pain.  Review of systems is otherwise negative.  Past Medical History: Past Medical History  Diagnosis Date  . Ischemic heart disease   . Aortic stenosis, mild   . Hyperlipidemia   . Insomnia   . Fatigue   . Dizziness   . Hypotension     orthostatic  . History of echocardiogram  02/09/2009    EF -  55-60%  . History of cardiovascular stress test 03/14/2009    EF - 57%   /  mild anterior hyperkinesia with discrete anterior scar consistent with previous infarction but unchanged from previous studies.  Overall, stable findings -- Colleen Can. Deborah Chalk, MD  . Weakness   . ASCVD (arteriosclerotic cardiovascular disease)   . Arrhythmia     known history of  . Weight loss, abnormal 2012  . 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 Protein-calorie malnutrition, moderate 2012    on PEG tube feeding  . Cellulitis of artificial external opening, post-operative 2012    of PEG tube site   Past Surgical History  Procedure Date  . 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 Cardiac catheterization 2007    EF - 55%  . Ablation of dysrhythmic focus   . Esophagogastroduodenoscopy 04/24/2011    Procedure: ESOPHAGOGASTRODUODENOSCOPY (EGD);  Surgeon: Malissa Hippo, MD;  Location: [facility];  Service: Endoscopy;  Laterality: N/A;  8:30 am  . Peg placement 04/24/2011    Procedure: PERCUTANEOUS ENDOSCOPIC GASTROSTOMY (PEG) PLACEMENT;  Surgeon: Malissa Hippo, MD;  Location: [facility];  Service: Endoscopy;  Laterality: N/A;    Medications: Prior to Admission medications   Medication Sig Start Date End Date Taking? Authorizing Provider  aspirin EC 81 MG tablet Take 81 mg by mouth daily.     Yes Historical Provider, MD  diphenhydramine-acetaminophen (TYLENOL PM) 25-500 MG TABS Take 0.5-1 tablets by mouth at bedtime  as needed. For sleep.   Yes Historical Provider, MD  HYDROcodone-acetaminophen (LORTAB) 7.5-500 MG/15ML solution Take 15 mLs by mouth every 4 (four) hours as needed. For pain. 05/08/11  Yes Historical Provider, MD  Nutritional Supplements (MELATONIN PO) Take 1 tablet by mouth daily.     Yes Historical Provider, MD  pantoprazole (PROTONIX) 40 MG tablet Take 1 tablet (40 mg total) by mouth daily. 04/24/11 04/23/12 Yes Malissa Hippo, MD  rosuvastatin (CRESTOR) 20 MG tablet Take 20  mg by mouth daily.     Yes Historical Provider, MD  zolpidem (AMBIEN) 10 MG tablet Take 5-10 mg by mouth at bedtime as needed. For sleep.   Yes Historical Provider, MD    Allergies:  No Known Allergies  Social History:  reports that he has quit smoking. His smoking use included Cigars. He quit smokeless tobacco use about 3 months ago. He reports that he drinks alcohol. He reports that he does not use illicit drugs.  He lives at home with his wife (she is a physical therapist at [employer])  Family History: Family History  Problem Relation Age of Onset  . Heart disease Father   . Heart disease Brother   . Heart disease Sister   . Cancer Sister     breast cancer  . Cancer Mother     breast ca    Physical Exam: Filed Vitals:   09/03/11 1535 09/03/11 2031  BP: 128/81 141/69  Pulse: 89 65  Temp: 98.8 F (37.1 C) 98.5 F (36.9 C)  TempSrc: Oral   Resp: 20 18  SpO2: 97% 99%   General appearance: alert, cooperative, appears stated age, fatigued and mild distress Head: Normocephalic, without obvious abnormality, atraumatic Resp: clear to auscultation bilaterally Cardio: regular rate and rhythm, S1, S2 normal, no murmur, click, rub or gallop GI: Soft, mod tenderness in area below the umbilicus, non-distended, few bowel sounds, peg in place, site looks clear Extremities: extremities normal, atraumatic, no cyanosis or edema   Labs on Admission:   [facility] 09/03/11 1635  NA 138  K 4.1  CL 101  CO2 25  GLUCOSE 99  BUN 22  CREATININE 1.08  CALCIUM 10.8*  MG --  PHOS --    Basename 09/03/11 1635  AST 25  ALT 15  ALKPHOS 36*  BILITOT 0.5  PROT 8.2  ALBUMIN 4.5    Basename 09/03/11 1635  LIPASE 15  AMYLASE --    Basename 09/03/11 1635  WBC 6.7  NEUTROABS 5.0  HGB 13.5  HCT 38.7*  MCV 99.2  PLT 153    Radiological Exams on Admission: Ct Angio Abdomen W/cm &/or Wo Contrast   IMPRESSION: Abdominal CT angiography demonstrates that the abnormal vessel  extending in the right lower quadrant mesentery is a vein rather than the artery.  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  Bilateral renal artery stenoses estimated at 50-70%.  Ct Abdomen Pelvis W Contrast 09/03/2011  IMPRESSION: Abnormal mesenteric vessels extending towards the right lower quadrant.  I think the differential diagnosis is branch vessel mesenteric vein thrombosis versus branch vessel arterial thrombosis or embolus.  I think it is difficult to be absolutely certain which we are dealing with.  Certainly there is no evidence of bowel infarction or edema.   Assessment/Plan Present on Admission:  .Renal artery stenosis, native, bilateral-an incidental finding.  Vascular surgery can comment if any intervention should be done at this time.  Given normal BP & renal function, this likely could just  be watched.  If he does stay on long term anti-coag, that may help this issue.  .Abdominal pain, acute, periumbilical-likely from thrombus, see below.  .Ischemic heart disease-stable.  No need for echo since this appears to be venous in nature.  .Hypercholesterolemia-stable  .Aortic stenosis-stable, watch IVF being given  .Protein-calorie malnutrition, moderate-making him NPO for now.  .Oropharyngeal cancer-finished treatment.  Currently on tube feeds with some oral supplement intake.  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  Nevertheless, before heparin started, I ordered a hypercoag panel to look for any other cause.  Given this is a venous thrombus, will need anticoagulation-likely long term coumadin.  He also needs a repeat CT angio with a VENOUS phase to clarify question of superior mesenteric vein thrombus vs branch vessel.  Would prefer to wait 1-2 days before doing this scan given double CT today with contrast.  No signs of ischemia-will check lactic acid level in morning for follow-up.  Will ask  Vasc Surgery to formally follow up in morning.  Have discussed with the patient and he confirms he is a DNR.  Disposition: once workup done & symptoms managed, potentially possible patient could go home on Lovenox & coumadin in a few days with close outpatient follow-up.  Hollice Espy 09/03/2011, 10:37 PM

## 2011-09-03 NOTE — Consult Note (Signed)
Reason for Consult:abdominal pain Referring Physician: Hendryx Ricke is an 70 y.o. male.  HPI: Pt. Known to me for placement of g-tube for head/neck cancer back in 8/12.  He has had intermittent abdominal pain and bloating but it has been worse over the last 3 days.  No fevers.  Past Medical History  Diagnosis Date  . Ischemic heart disease   . Aortic stenosis, mild   . Hyperlipidemia   . Insomnia   . Fatigue   . Dizziness   . Hypotension     orthostatic  . History of echocardiogram 02/09/2009    EF -  55-60%  . History of cardiovascular stress test 03/14/2009    EF - 57%   /  mild anterior hyperkinesia with discrete anterior scar consistent with previous infarction but unchanged from previous studies.  Overall, stable findings -- Colleen Can. Deborah Chalk, MD  . Weakness   . ASCVD (arteriosclerotic cardiovascular disease)   . Arrhythmia     known history of  . Weight loss, abnormal 2012  . 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 Protein-calorie malnutrition, moderate 2012    on PEG tube feeding  . Cellulitis of artificial external opening, post-operative 2012    of PEG tube site    Past Surgical History  Procedure Date  . 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 Cardiac catheterization 2007    EF - 55%  . Ablation of dysrhythmic focus   . Esophagogastroduodenoscopy 04/24/2011    Procedure: ESOPHAGOGASTRODUODENOSCOPY (EGD);  Surgeon: Malissa Hippo, MD;  Location: [facility];  Service: Endoscopy;  Laterality: N/A;  8:30 am  . Peg placement 04/24/2011    Procedure: PERCUTANEOUS ENDOSCOPIC GASTROSTOMY (PEG) PLACEMENT;  Surgeon: Malissa Hippo, MD;  Location: [facility];  Service: Endoscopy;  Laterality: N/A;    Family History  Problem Relation Age of Onset  . Heart disease Father   . Heart disease Brother   . Heart disease Sister   . Cancer Sister     breast cancer  . Cancer Mother     breast ca    Social History:  reports that he has quit smoking. His smoking use  included Cigars. He quit smokeless tobacco use about 3 months ago. He reports that he drinks alcohol. He reports that he does not use illicit drugs.  Allergies: No Known Allergies  Medications: I have reviewed the patient's current medications.  Results for orders placed during the hospital encounter of 09/03/11 (from the past 48 hour(s))  CBC     Status: Abnormal   Collection Time   09/03/11  4:35 PM      Component Value Range Comment   WBC 6.7  4.0 - 10.5 (K/uL)    RBC 3.90 (*) 4.22 - 5.81 (MIL/uL)    Hemoglobin 13.5  13.0 - 17.0 (g/dL)    HCT 82.9 (*) 56.2 - 52.0 (%)    MCV 99.2  78.0 - 100.0 (fL)    MCH 34.6 (*) 26.0 - 34.0 (pg)    MCHC 34.9  30.0 - 36.0 (g/dL)    RDW 13.0  86.5 - 78.4 (%)    Platelets 153  150 - 400 (K/uL)   DIFFERENTIAL     Status: Abnormal   Collection Time   09/03/11  4:35 PM      Component Value Range Comment   Neutrophils Relative 74  43 - 77 (%)    Neutro Abs 5.0  1.7 - 7.7 (K/uL)  Lymphocytes Relative 13  12 - 46 (%)    Lymphs Abs 0.9  0.7 - 4.0 (K/uL)    Monocytes Relative 13 (*) 3 - 12 (%)    Monocytes Absolute 0.9  0.1 - 1.0 (K/uL)    Eosinophils Relative 0  0 - 5 (%)    Eosinophils Absolute 0.0  0.0 - 0.7 (K/uL)    Basophils Relative 0  0 - 1 (%)    Basophils Absolute 0.0  0.0 - 0.1 (K/uL)   COMPREHENSIVE METABOLIC PANEL     Status: Abnormal   Collection Time   09/03/11  4:35 PM      Component Value Range Comment   Sodium 138  135 - 145 (mEq/L)    Potassium 4.1  3.5 - 5.1 (mEq/L)    Chloride 101  96 - 112 (mEq/L)    CO2 25  19 - 32 (mEq/L)    Glucose, Bld 99  70 - 99 (mg/dL)    BUN 22  6 - 23 (mg/dL)    Creatinine, Ser 1.61  0.50 - 1.35 (mg/dL)    Calcium 09.6 (*) 8.4 - 10.5 (mg/dL)    Total Protein 8.2  6.0 - 8.3 (g/dL)    Albumin 4.5  3.5 - 5.2 (g/dL)    AST 25  0 - 37 (U/L)    ALT 15  0 - 53 (U/L)    Alkaline Phosphatase 36 (*) 39 - 117 (U/L)    Total Bilirubin 0.5  0.3 - 1.2 (mg/dL)    GFR calc non Af Amer 68 (*) >90 (mL/min)     GFR calc Af Amer 78 (*) >90 (mL/min)   LIPASE, BLOOD     Status: Normal   Collection Time   09/03/11  4:35 PM      Component Value Range Comment   Lipase 15  11 - 59 (U/L)   URINALYSIS, ROUTINE W REFLEX MICROSCOPIC     Status: Abnormal   Collection Time   09/03/11  5:48 PM      Component Value Range Comment   Color, Urine YELLOW  YELLOW     APPearance CLEAR  CLEAR     Specific Gravity, Urine 1.023  1.005 - 1.030     pH 6.0  5.0 - 8.0     Glucose, UA NEGATIVE  NEGATIVE (mg/dL)    Hgb urine dipstick NEGATIVE  NEGATIVE     Bilirubin Urine NEGATIVE  NEGATIVE     Ketones, ur TRACE (*) NEGATIVE (mg/dL)    Protein, ur NEGATIVE  NEGATIVE (mg/dL)    Urobilinogen, UA 0.2  0.0 - 1.0 (mg/dL)    Nitrite NEGATIVE  NEGATIVE     Leukocytes, UA NEGATIVE  NEGATIVE  MICROSCOPIC NOT DONE ON URINES WITH NEGATIVE PROTEIN, BLOOD, LEUKOCYTES, NITRITE, OR GLUCOSE <1000 mg/dL.    Ct Abdomen Pelvis W Contrast  09/03/2011  *RADIOLOGY REPORT*  Clinical Data: Abdominal pain.  CT ABDOMEN AND PELVIS WITH CONTRAST  Technique:  Multidetector CT imaging of the abdomen and pelvis was performed following the standard protocol during bolus administration of intravenous contrast.  Contrast: OMNIPAQUE IOHEXOL 300 MG/ML IV SOLN  Comparison: None.  Findings: Lung bases are clear.  No pleural or pericardial fluid.  The liver appears normal.  No calcified gallstones.  The spleen is normal.  The pancreas is normal.  The adrenal glands are normal. Both kidneys are normal.  The aorta shows atherosclerotic change but no aneurysm.  The IVC is normal.  No retroperitoneal mass or  adenopathy.  No free intraperitoneal fluid or air.  Peg tube has good appearance.  There is some stranding around branches of the mesenteric vessels extending towards the right lower quadrant. I think this could represent branch vessel superior mesenteric vein thrombosis or embolus or thrombosis of a superior mesenteric arterial branch.  There is no portal  vein thrombosis.  No primary bowel pathology is discernible.  There is a normal appearing appendix.  No free fluid in the pelvis.  Bladder appears normal. Ordinary degenerative changes present in the lower lumbar spine.  IMPRESSION: Abnormal mesenteric vessels extending towards the right lower quadrant.  I think the differential diagnosis is branch vessels appear mesenteric vein thrombosis versus branch vessel arterial thrombosis or embolus.  I think it is difficult to be absolutely certain which we are dealing with.  Certainly there is no evidence of bowel infarction or edema.  Original Report Authenticated By: Thomasenia Sales, M.D.    @ROS @ Blood pressure 128/81, pulse 89, temperature 98.8 F (37.1 C), temperature source Oral, resp. rate 20, SpO2 97.00%. General appearance: alert, cooperative and no distress GI: abdomen is soft, and has very minimal lower abdominal tenderness, ND, no peritoneal signs on exam, Gtube looks okay without sign of infection  Assessment/Plan: Abdominal pain and possible mesenteric ischemia or angina.  Certainly no peritonitis on exam and doubt ischemic bowel. WBC is normal and no evidence of bowel irritation or thickening on scan.  I spoke with the radiologist and he thinks that there might be a branch of SMA with thrombus or emboli but again, no evidence of bowel compromise.  I have discussed this case with the ER physician and have recommended vascular surgery evaluation for definitive treatment, intervention, and anticoagulation recommendations.  Again, he looks well and there is no evidence of bowel compromise on exam, CT, labs or from his history as well.  Lodema Pilot DAVID 09/03/2011, 8:20 PM

## 2011-09-03 NOTE — ED Notes (Signed)
MD at bedside. 

## 2011-09-04 ENCOUNTER — Encounter (HOSPITAL_COMMUNITY): Payer: Self-pay

## 2011-09-04 LAB — LUPUS ANTICOAGULANT PANEL: DRVVT: 35 secs (ref 34.1–42.2)

## 2011-09-04 LAB — BASIC METABOLIC PANEL
Calcium: 9.4 mg/dL (ref 8.4–10.5)
Chloride: 105 mEq/L (ref 96–112)
Creatinine, Ser: 1 mg/dL (ref 0.50–1.35)
GFR calc Af Amer: 86 mL/min — ABNORMAL LOW (ref >90)
Sodium: 137 mEq/L (ref 135–145)

## 2011-09-04 LAB — CBC
MCH: 34.8 pg — ABNORMAL HIGH (ref 26.0–34.0)
MCV: 100 fL (ref 78.0–100.0)
Platelets: 124 10*3/uL — ABNORMAL LOW (ref 150–400)
RDW: 13.7 % (ref 11.5–15.5)
WBC: 4.6 10*3/uL (ref 4.0–10.5)

## 2011-09-04 LAB — URINE CULTURE: Culture: NO GROWTH

## 2011-09-04 LAB — HEPARIN LEVEL (UNFRACTIONATED): Heparin Unfractionated: 0.85 IU/mL — ABNORMAL HIGH (ref 0.30–0.70)

## 2011-09-04 LAB — CARDIOLIPIN ANTIBODIES, IGG, IGM, IGA
Anticardiolipin IgA: 7 APL U/mL — ABNORMAL LOW (ref <22)
Anticardiolipin IgG: 8 GPL U/mL — ABNORMAL LOW (ref <23)
Anticardiolipin IgM: 3 MPL U/mL — ABNORMAL LOW (ref <11)

## 2011-09-04 LAB — BETA-2-GLYCOPROTEIN I ABS, IGG/M/A: Beta-2-Glycoprotein I IgA: 2 A Units (ref <20)

## 2011-09-04 MED ORDER — PANTOPRAZOLE SODIUM 40 MG IV SOLR
40.0000 mg | INTRAVENOUS | Status: DC
  Administered 2011-09-05: 40 mg via INTRAVENOUS
  Filled 2011-09-04 (×2): qty 40

## 2011-09-04 MED ORDER — HEPARIN SOD (PORCINE) IN D5W 100 UNIT/ML IV SOLN
800.0000 [IU]/h | INTRAVENOUS | Status: DC
  Administered 2011-09-04 – 2011-09-07 (×3): 800 [IU]/h via INTRAVENOUS
  Filled 2011-09-04 (×4): qty 250

## 2011-09-04 MED ORDER — HEPARIN SOD (PORCINE) IN D5W 100 UNIT/ML IV SOLN
800.0000 [IU]/h | INTRAVENOUS | Status: DC
  Filled 2011-09-04: qty 250

## 2011-09-04 MED ORDER — LORAZEPAM 2 MG/ML IJ SOLN
0.5000 mg | Freq: Four times a day (QID) | INTRAMUSCULAR | Status: DC | PRN
  Administered 2011-09-04: 14:00:00 via INTRAVENOUS
  Administered 2011-09-04 – 2011-09-07 (×5): 0.5 mg via INTRAVENOUS
  Filled 2011-09-04 (×6): qty 1

## 2011-09-04 NOTE — Progress Notes (Signed)
ANTICOAGULATION CONSULT NOTE - Follow Up Consult  Pharmacy Consult for Heparin Indication: Mesenteric vein thrombosis  No Known Allergies  Patient Measurements: Height: 6\' 1"  (185.4 cm) Weight: 190 lb 6.4 oz (86.365 kg) IBW/kg (Calculated) : 79.9    Vital Signs: Temp: 98.1 F (36.7 C) (12/13 1655) Temp src: Oral (12/13 1655) BP: 169/72 mmHg (12/13 1655) Pulse Rate: 66  (12/13 1655)  Labs:  Basename 09/04/11 1640 09/04/11 0930 09/04/11 0500 09/03/11 1635  HGB -- -- 11.1* 13.5  HCT -- -- 31.9* 38.7*  PLT -- -- 124* 153  APTT -- -- -- --  LABPROT -- -- -- --  INR -- -- -- --  HEPARINUNFRC 0.85* 0.76* -- --  CREATININE -- -- 1.00 1.08  CKTOTAL -- -- -- --  CKMB -- -- -- --  TROPONINI -- -- -- --   Estimated Creatinine Clearance: 77.7 ml/min (by C-G formula based on Cr of 1).   Medications:  Scheduled:    . aspirin EC  81 mg Oral Daily  . heparin  2,500 Units Intravenous Once  .  HYDROmorphone (DILAUDID) injection  1 mg Intravenous Once  . LORazepam  1 mg Intravenous Once  . ondansetron  4 mg Intravenous Once  . pantoprazole (PROTONIX) IV  40 mg Intravenous Q24H  . sodium chloride  500 mL Intravenous Once   Infusions:    . dextrose 5 % and 0.9% NaCl 50 mL/hr at 09/04/11 0509  . heparin    . DISCONTD: sodium chloride 125 mL/hr at 09/03/11 2205  . 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  HL = 0.85 units/ml after decreasing drip to 1000 units/hr.   No bleeding/lab drawn correctly per RN Goal of Therapy:  Heparin level 0.3-0.7 units/ml   Plan:  Decrease heparin to 800 units/hr. Recheck HL @ 0200 12/14.  Lorenza Evangelist 09/04/2011,5:46 PM

## 2011-09-04 NOTE — Progress Notes (Signed)
Subjective: 70 yo white male w/past med history of ischemic heart disease and who just recently completed treatment for oropharyngeal cancer - being fed via PEG admitted last night with abdominal pain, nausea and Vomiting. He was diagnosed with Abnormal mesenteric vessels extending towards the right lower Quadrant c/w  a branch vessel mesenteric venous thrombosis.  No apparent obstruction and no apparent bowel ischemia. Patient was admitted for bowel rest, hydration and anticoagulation pending further workup and treatment.  He was seen by Gen Surg and Vasc Surg (although I do not see their note).    He reports less pain.  The meds are working but continued N/V   Objective: Vital signs in last 24 hours: Temp:  [98 F (36.7 C)-98.8 F (37.1 C)] 98.1 F (36.7 C) (12/13 0623) Pulse Rate:  [59-89] 59  (12/13 0623) Resp:  [16-20] 16  (12/13 0623) BP: (105-141)/(59-81) 105/59 mmHg (12/13 0623) SpO2:  [96 %-99 %] 96 % (12/13 0623) Weight:  [188 lb (85.276 kg)] 188 lb (85.276 kg) (12/12 2236) Weight change:     PE) General appearance: alert and cooperative Eyes: no scleral icterus Throat: oropharynx moist without erythema Resp: clear to auscultation bilaterally Cardio: regular rate and rhythm, S1, S2 normal, no murmur, click, rub or gallop GI: firm, Mild-tender; bowel sounds distant.  R Ab PEG - CDI; no masses,  no organomegaly. No reb no guarding. Extremities: no clubbing, cyanosis or edema   Lab Results:  Basename 09/04/11 0500 09/03/11 1635  NA 137 138  K 3.7 4.1  CL 105 101  CO2 24 25  GLUCOSE 93 99  BUN 16 22  CREATININE 1.00 1.08  CALCIUM 9.4 10.8*  MG -- --  PHOS -- --    Basename 09/03/11 1635  AST 25  ALT 15  ALKPHOS 36*  BILITOT 0.5  PROT 8.2  ALBUMIN 4.5    Basename 09/04/11 0500 09/03/11 1635  WBC 4.6 6.7  NEUTROABS -- 5.0  HGB 11.1* 13.5  HCT 31.9* 38.7*  MCV 100.0 99.2  PLT 124* 153    Studies/Results: Ct Angio Abdomen W/cm &/or Wo  Contrast  09/03/2011  *RADIOLOGY REPORT*  Clinical Data:  Vascular pathology right lower abdomen.  Assess for arterial versus venous.  Abdominal pain.  CT ANGIOGRAPHY ABDOMEN  Technique:  Multidetector CT imaging of the abdomen was performed using the standard protocol during bolus administration of intravenous contrast.  Multiplanar reconstructed images including MIPs were obtained and reviewed to evaluate the vascular anatomy.  Contrast: 80mL OMNIPAQUE IOHEXOL 350 MG/ML IV SOLN  Comparison:  Same day  Findings:  The examination shows conclusively that the abnormal vessel represents a branch of the superior mesenteric vein rather than the superior mesenteric artery.  Being an arterial phase exam, I cannot assess with certainty the extent of the venous thrombosis but I do not that it involves the main superior mesenteric vein but only the branch vessel.  The aorta shows atherosclerotic change but no aneurysm.  Celiac origin is widely patent.  There is atherosclerotic plaque at the superior mesenteric artery origin but I do not think there is a flow-limiting stenosis.  I cannot identify a patent inferior mesenteric artery.  There is atherosclerotic plaque at both renal artery origins.  On the left, there could possibly be a flow limiting stenosis.  I would estimate this at 50-70%.  On the right, there could also be a flow limiting stenosis estimated at 50%.  The only other parenchymal finding that was not seen or appreciated on  the initial scan is the presence of some small mesenteric lymph nodes, none larger than 4 mm in size.  These appear normally distributed and I think are normal structures.  Review of the MIP images confirms the above findings.  IMPRESSION: Abdominal CT angiography demonstrates that the abnormal vessel extending in the right lower quadrant mesentery is a vein rather than the artery.  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  Bilateral renal artery stenoses  estimated at 50-70%.  Original Report Authenticated By: Thomasenia Sales, M.D.   Ct Abdomen Pelvis W Contrast  09/03/2011  *RADIOLOGY REPORT*  Clinical Data: Abdominal pain.  CT ABDOMEN AND PELVIS WITH CONTRAST  Technique:  Multidetector CT imaging of the abdomen and pelvis was performed following the standard protocol during bolus administration of intravenous contrast.  Contrast: OMNIPAQUE IOHEXOL 300 MG/ML IV SOLN  Comparison: None.  Findings: Lung bases are clear.  No pleural or pericardial fluid.  The liver appears normal.  No calcified gallstones.  The spleen is normal.  The pancreas is normal.  The adrenal glands are normal. Both kidneys are normal.  The aorta shows atherosclerotic change but no aneurysm.  The IVC is normal.  No retroperitoneal mass or adenopathy.  No free intraperitoneal fluid or air.  Peg tube has good appearance.  There is some stranding around branches of the mesenteric vessels extending towards the right lower quadrant. I think this could represent branch vessel superior mesenteric vein thrombosis or embolus or thrombosis of a superior mesenteric arterial branch.  There is no portal vein thrombosis.  No primary bowel pathology is discernible.  There is a normal appearing appendix.  No free fluid in the pelvis.  Bladder appears normal. Ordinary degenerative changes present in the lower lumbar spine.  IMPRESSION: Abnormal mesenteric vessels extending towards the right lower quadrant.  I think the differential diagnosis is branch vessels appear mesenteric vein thrombosis versus branch vessel arterial thrombosis or embolus.  I think it is difficult to be absolutely certain which we are dealing with.  Certainly there is no evidence of bowel infarction or edema.  Original Report Authenticated By: Thomasenia Sales, M.D.     Medications: Scheduled:   . aspirin EC  81 mg Oral Daily  . heparin  2,500 Units Intravenous Once  .  HYDROmorphone (DILAUDID) injection  1 mg Intravenous Once   .  HYDROmorphone (DILAUDID) injection  1 mg Intravenous Once  . LORazepam  1 mg Intravenous Once  . ondansetron  4 mg Intravenous Once  . ondansetron  4 mg Intravenous Once  . pantoprazole (PROTONIX) IV  40 mg Intravenous Q24H  . sodium chloride  500 mL Intravenous Once  . sodium chloride  500 mL Intravenous Once   Continuous:   . sodium chloride 125 mL/hr at 09/03/11 2205  . dextrose 5 % and 0.9% NaCl 50 mL/hr at 09/04/11 1610  . heparin 14 Units/kg/hr (09/04/11 0110)    Assessment/Plan: Principal Problem:  *Abdominal pain, acute, periumbilical Active Problems:  Ischemic heart disease  Hypercholesterolemia  Aortic stenosis  Protein-calorie malnutrition, moderate  Oropharyngeal cancer  Renal artery stenosis, native, bilateral  Thrombosis of mesenteric vein   Assessment/Plan  Present on Admission:  .Renal artery stenosis, native, bilateral-an incidental finding. Vascular surgery can comment if any intervention should be done at this time. Given normal BP & renal function, this likely could just be watched. If he does stay on long term anti-coag, that may help this issue.   .Abdominal pain, acute,  periumbilical-likely from thrombus, see below.  .Ischemic heart disease-stable. No need for echo since this appears to be venous in nature.  .Hypercholesterolemia-stable   .Aortic stenosis-stable, watch IVF being given   .Protein-calorie malnutrition, moderate-making him NPO for now.  Continue D5 and Bowel Rest.  .Oropharyngeal cancer-finished treatment. Currently on tube feeds with some oral supplement intake.   Continue DNR.  Marland KitchenThrombosis of mesenteric vein-likely cause is patient's hypercoaguable state from his cancer. Continue Heparin gtt.  Start Coumadin when he can tolerate POs - maybe tomorrow. Follow up on Thrombophilia profile.  Hydrate.  Bowel Rest.  Vasc Surgery input.  Sx and Pain control.   He needs a repeat CT angio with a VENOUS phase to clarify question of  superior mesenteric vein thrombus vs branch vessel. Would prefer to wait 1-2 days before doing this scan given double CT today with contrast.   No signs of ischemia-White count is fine.  Lactate Pending.       Disposition: once workup done & symptoms managed, potentially possible patient could go home on Lovenox & coumadin in a few days with close outpatient follow-up.    Change attending to me     LOS: 1 day   Travius Crochet M 09/04/2011, 7:46 AM

## 2011-09-04 NOTE — Progress Notes (Signed)
ANTICOAGULATION CONSULT NOTE - Initial Consult  Pharmacy Consult for Heparin Indication: DVT  No Known Allergies  Patient Measurements: Height: 6\' 1"  (185.4 cm) Weight: 188 lb (85.276 kg) IBW/kg (Calculated) : 79.9  Adjusted Body Weight:   Vital Signs: Temp: 98.5 F (36.9 C) (12/12 2338) Temp src: Oral (12/12 2300) BP: 133/71 mmHg (12/12 2338) Pulse Rate: 69  (12/12 2338)  Labs:  Basename 09/03/11 1635  HGB 13.5  HCT 38.7*  PLT 153  APTT --  LABPROT --  INR --  HEPARINUNFRC --  CREATININE 1.08  CKTOTAL --  CKMB --  TROPONINI --   Estimated Creatinine Clearance: 71.9 ml/min (by C-G formula based on Cr of 1.08).  Medical History: Past Medical History  Diagnosis Date  . Ischemic heart disease   . Aortic stenosis, mild   . Hyperlipidemia   . Insomnia   . Fatigue   . Dizziness   . Hypotension     orthostatic  . History of echocardiogram 02/09/2009    EF -  55-60%  . History of cardiovascular stress test 03/14/2009    EF - 57%   /  mild anterior hyperkinesia with discrete anterior scar consistent with previous infarction but unchanged from previous studies.  Overall, stable findings -- Colleen Can. Deborah Chalk, MD  . Weakness   . ASCVD (arteriosclerotic cardiovascular disease)   . Arrhythmia     known history of  . Weight loss, abnormal 2012  . 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 Protein-calorie malnutrition, moderate 2012    on PEG tube feeding  . Cellulitis of artificial external opening, post-operative 2012    of PEG tube site    Medications:   (Not in a hospital admission)  Assessment: Pt with DVT and history of Cancer.  Anticoagulation panel drawn.   Goal of Therapy:  Heparin level 0.3-0.7 units/ml   Plan:  Heparin 2500 units bolus, then 1150 units/hr.  Daily CBC and heparin level.  1st Heparin level 8hr after drip starts.  Darlina Guys, Jacquenette Shone Crowford 09/04/2011,12:02 AM

## 2011-09-04 NOTE — ED Notes (Signed)
To [facility] via bed with family at side

## 2011-09-04 NOTE — Progress Notes (Signed)
ANTICOAGULATION CONSULT NOTE - Follow Up Consult  Pharmacy Consult for IV heparin Indication: thrombosis of mesenteric vein  No Known Allergies  Patient Measurements: Height: 6\' 1"  (185.4 cm) Weight: 188 lb (85.276 kg) IBW/kg (Calculated) : 79.9  Heparin dosing weight = 85 kg  Vital Signs: Temp: 98.1 F (36.7 C) (12/13 0623) Temp src: Oral (12/13 0623) BP: 105/59 mmHg (12/13 0623) Pulse Rate: 59  (12/13 0623)  Labs:  Basename 09/04/11 0930 09/04/11 0500 09/03/11 1635  HGB -- 11.1* 13.5  HCT -- 31.9* 38.7*  PLT -- 124* 153  APTT -- -- --  LABPROT -- -- --  INR -- -- --  HEPARINUNFRC 0.76* -- --  CREATININE -- 1.00 1.08  CKTOTAL -- -- --  CKMB -- -- --  TROPONINI -- -- --   Estimated Creatinine Clearance: 77.7 ml/min (by C-G formula based on Cr of 1).   Medications:  Scheduled:    . aspirin EC  81 mg Oral Daily  . heparin  2,500 Units Intravenous Once  .  HYDROmorphone (DILAUDID) injection  1 mg Intravenous Once  .  HYDROmorphone (DILAUDID) injection  1 mg Intravenous Once  . LORazepam  1 mg Intravenous Once  . ondansetron  4 mg Intravenous Once  . ondansetron  4 mg Intravenous Once  . pantoprazole (PROTONIX) IV  40 mg Intravenous Q24H  . sodium chloride  500 mL Intravenous Once  . sodium chloride  500 mL Intravenous Once   Infusions:    . dextrose 5 % and 0.9% NaCl 50 mL/hr at 09/04/11 0509  . heparin 14 Units/kg/hr (09/04/11 0110)  . DISCONTD: sodium chloride 125 mL/hr at 09/03/11 2205    Assessment:  70 YOM admitted with abd pain.   CT abd/pelvis showed thrombosis of mesenteric vein. H/o oropharyngeal cancer.   Heparin gtt started, now on 1150 units/hr and heparin level returns as 0.76, slightly supratherapeutic.   Hgb and Plt down.  Plan coumadin tomorrow. Anticoag panel pending  Goal of Therapy:  Heparin level 0.3-0.7 units/ml   Plan:   Decrease heparin to 1000 units/hr   Recheck heparin level in 6 hours  Geoffry Paradise  Thi 09/04/2011,10:52 AM

## 2011-09-04 NOTE — ED Notes (Signed)
Report given to Oneje Rn on [facility]

## 2011-09-04 NOTE — ED Notes (Signed)
Attempted to call report.  Bed changed to 1313, and is still being cleaned.

## 2011-09-04 NOTE — ED Notes (Signed)
Call from pharmacist to decrease heparin drip rate to 37ml/hr. Heparin level to be rechecked @1700 . Primary nurse notified of rate change and upcoming blood draw.

## 2011-09-05 DIAGNOSIS — R1032 Left lower quadrant pain: Secondary | ICD-10-CM

## 2011-09-05 LAB — CBC
HCT: 33.7 % — ABNORMAL LOW (ref 39.0–52.0)
Hemoglobin: 11.8 g/dL — ABNORMAL LOW (ref 13.0–17.0)
MCH: 35 pg — ABNORMAL HIGH (ref 26.0–34.0)
MCHC: 35 g/dL (ref 30.0–36.0)
RBC: 3.37 MIL/uL — ABNORMAL LOW (ref 4.22–5.81)

## 2011-09-05 LAB — COMPREHENSIVE METABOLIC PANEL
ALT: 12 U/L (ref 0–53)
Alkaline Phosphatase: 32 U/L — ABNORMAL LOW (ref 39–117)
BUN: 10 mg/dL (ref 6–23)
CO2: 27 mEq/L (ref 19–32)
GFR calc Af Amer: 80 mL/min — ABNORMAL LOW (ref >90)
GFR calc non Af Amer: 69 mL/min — ABNORMAL LOW (ref >90)
Glucose, Bld: 101 mg/dL — ABNORMAL HIGH (ref 70–99)
Potassium: 3.8 mEq/L (ref 3.5–5.1)
Sodium: 140 mEq/L (ref 135–145)
Total Protein: 6.6 g/dL (ref 6.0–8.3)

## 2011-09-05 LAB — PROTEIN C ACTIVITY: Protein C Activity: 131 % (ref 75–133)

## 2011-09-05 LAB — PROTIME-INR: INR: 1.11 (ref 0.00–1.49)

## 2011-09-05 LAB — ANTITHROMBIN III: AntiThromb III Func: 98 % (ref 76–126)

## 2011-09-05 LAB — HEPARIN LEVEL (UNFRACTIONATED): Heparin Unfractionated: 0.5 IU/mL (ref 0.30–0.70)

## 2011-09-05 LAB — PROTEIN S ACTIVITY: Protein S Activity: 68 % — ABNORMAL LOW (ref 69–129)

## 2011-09-05 LAB — LACTIC ACID, PLASMA: Lactic Acid, Venous: 0.9 mmol/L (ref 0.5–2.2)

## 2011-09-05 MED ORDER — WARFARIN SODIUM 6 MG PO TABS
6.0000 mg | ORAL_TABLET | Freq: Once | ORAL | Status: AC
  Administered 2011-09-05: 6 mg via ORAL
  Filled 2011-09-05: qty 1

## 2011-09-05 MED ORDER — OSMOLITE 1.5 CAL PO LIQD
1000.0000 mL | ORAL | Status: DC
  Administered 2011-09-05 – 2011-09-07 (×3): 1000 mL
  Filled 2011-09-05 (×6): qty 1000

## 2011-09-05 MED ORDER — PANTOPRAZOLE SODIUM 40 MG PO TBEC
40.0000 mg | DELAYED_RELEASE_TABLET | Freq: Every day | ORAL | Status: DC
  Administered 2011-09-06 – 2011-09-07 (×2): 40 mg via ORAL
  Filled 2011-09-05 (×5): qty 1

## 2011-09-05 MED ORDER — ZOLPIDEM TARTRATE 5 MG PO TABS: 5.0000 mg | ORAL_TABLET | Freq: Every evening | ORAL | Status: DC | PRN

## 2011-09-05 MED ORDER — ROSUVASTATIN CALCIUM 20 MG PO TABS
20.0000 mg | ORAL_TABLET | Freq: Every day | ORAL | Status: DC
  Administered 2011-09-05 – 2011-09-07 (×3): 20 mg via ORAL
  Filled 2011-09-05 (×4): qty 1

## 2011-09-05 NOTE — Progress Notes (Signed)
INITIAL ADULT NUTRITION ASSESSMENT Date: 09/05/2011   Time: 11:31 AM Reason for Assessment: Consult  ASSESSMENT: Male 70 y.o.  Dx: Abdominal pain, acute, periumbilical  Hx:  Past Medical History  Diagnosis Date  . Ischemic heart disease   . Aortic stenosis, mild   . Hyperlipidemia   . Insomnia   . Fatigue   . Dizziness   . Hypotension     orthostatic  . History of echocardiogram 02/09/2009    EF -  55-60%  . History of cardiovascular stress test 03/14/2009    EF - 57%   /  mild anterior hyperkinesia with discrete anterior scar consistent with previous infarction but unchanged from previous studies.  Overall, stable findings -- Colleen Can. Deborah Chalk, MD  . Weakness   . ASCVD (arteriosclerotic cardiovascular disease)   . Arrhythmia     known history of  . Weight loss, abnormal 2012  . 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 Protein-calorie malnutrition, moderate 2012    on PEG tube feeding  . Cellulitis of artificial external opening, post-operative 2012    of PEG tube site   Related Meds:  Scheduled Meds:   . aspirin EC  81 mg Oral Daily  . pantoprazole  40 mg Oral Q0600  . rosuvastatin  20 mg Oral q1800  . DISCONTD: pantoprazole (PROTONIX) IV  40 mg Intravenous Q24H   Continuous Infusions:   . dextrose 5 % and 0.9% NaCl 50 mL/hr at 09/04/11 0509  . heparin 800 Units/hr (09/04/11 2108)  . DISCONTD: heparin     PRN Meds:.acetaminophen, acetaminophen, HYDROmorphone, LORazepam, ondansetron (ZOFRAN) IV, ondansetron, zolpidem  Ht: 6\' 1"  (185.4 cm)  Wt: 190 lb 6.4 oz (86.365 kg)  Ideal Wt: 83.6kg % Ideal Wt: 103  Usual Wt: 88.2kg % Usual Wt: 98  Body mass index is 25.12 kg/(m^2).  Food/Nutrition Related Hx: Pt c/o abdominal pain, nausea/vomiting for the past 4 days. Pt states he was using some kind of oral nutrition supplement he got from the pharmacy that contained 550 calories (likely very high calore carnation instant breakfast), which he was drinking 3 times a day,  plus 3 cans of tube feeding running continuously at night, pt unsure what kind of formula it was, however noted pt previously on Osmolite 1.5/Jevity 1.5. Estimated pt's nutritional intake PTA to be 2745 calories, 114g protein. Pt reports trying some liquid diet today, but reports not tolerating it well and c/o nausea.   - Per cancer center records, pt previously on 6-7 cans of a combination of Osmolite 1.5 and Jevity 1.5 on 08/25/11, but encouraged to try to transition to oral supplementation, which pt was doing PTA.   CT of angio abdomen 12/12 showed abdominal CT angiography demonstrates that the abnormal vessel extending in the right lower quadrant mesentery is a vein rather than the artery. 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  Bilateral renal artery stenoses estimated at 50-70%.  Labs:  CMP     Component Value Date/Time   NA 140 09/05/2011 0325   K 3.8 09/05/2011 0325   CL 105 09/05/2011 0325   CO2 27 09/05/2011 0325   GLUCOSE 101* 09/05/2011 0325   BUN 10 09/05/2011 0325   BUN 15 04/28/2011 0950   CREATININE 1.06 09/05/2011 0325   CREATININE 1.0 04/28/2011 0950   CALCIUM 9.9 09/05/2011 0325   PROT 6.6 09/05/2011 0325   ALBUMIN 3.6 09/05/2011 0325   AST 20 09/05/2011 0325   ALT 12 09/05/2011 0325   ALKPHOS 32* 09/05/2011 0325  BILITOT 0.5 09/05/2011 0325   GFRNONAA 69* 09/05/2011 0325   GFRAA 80* 09/05/2011 0325    Intake/Output Summary (Last 24 hours) at 09/05/11 1134 Last data filed at 09/05/11 0551  Gross per 24 hour  Intake 1142.5 ml  Output   2325 ml  Net -1182.5 ml    Diet Order: Full Liquid  IVF:    dextrose 5 % and 0.9% NaCl Last Rate: 50 mL/hr at 09/04/11 0509  heparin Last Rate: 800 Units/hr (09/04/11 2108)  DISCONTD: heparin     Estimated Nutritional Needs:   Kcal:2150-2600 Protein:105-130g Fluid:2.1-2.6L  NUTRITION DIAGNOSIS: -Inadequate oral intake (NI-2.1).  Status: Ongoing -Pt meets criteria for moderate PCM of  acute illness AEB 2% weight loss and <50% intake for the past 4-7 days per pt statement  RELATED TO: nausea/vomiting/abdominal pain  AS EVIDENCE BY: pt statement   MONITORING/EVALUATION(Goals): 1. Resolution of nausea 2. TF to meet >90% estimated nutritional needs 3. TF tolerance  EDUCATION NEEDS: -No education needs identified at this time  INTERVENTION: Osmolite 1.5 via PEG start at 38ml/hr increase by 10ml q4hr to goal of 27ml/hr which will provide 2700 calories, 113g protein, free water,meeting 100% of estimated nutritional needs. If IVF d/c, recommend water flushes via PEG q4hr. If nausea continues, recommend changing antiemetics to a scheduled medication versus PRN or switching to a stronger antiemetic. Encouraged pt to notify RN as soon as pt feels nauseated. Will monitor.   Dietitian [phone]  DOCUMENTATION CODES Per approved criteria  -Non-severe (moderate) malnutrition in the context of acute illness or injury    Marshall Cork 09/05/2011, 11:31 AM

## 2011-09-05 NOTE — Progress Notes (Signed)
ANTICOAGULATION CONSULT NOTE - Follow Up Consult  Pharmacy Consult for Heparin(12/13)/Warfarin (12/14) Indication: Mesenteric vein thrombosis  No Known Allergies  Patient Measurements: Height: 6\' 1"  (185.4 cm) Weight: 190 lb 6.4 oz (86.365 kg) IBW/kg (Calculated) : 79.9   Labs:  Basename 09/05/11 1220 09/05/11 0930 09/05/11 0325 09/05/11 0139 09/04/11 1640 09/04/11 0500 09/03/11 1635  HGB -- -- 11.8* -- -- 11.1* --  HCT -- -- 33.7* -- -- 31.9* 38.7*  PLT -- -- 123* -- -- 124* 153  APTT -- -- -- -- -- -- --  LABPROT 14.5 -- -- -- -- -- --  INR 1.11 -- -- -- -- -- --  HEPARINUNFRC -- 0.52 -- 0.50 0.85* -- --  CREATININE -- -- 1.06 -- -- 1.00 1.08  CKTOTAL -- -- -- -- -- -- --  CKMB -- -- -- -- -- -- --  TROPONINI -- -- -- -- -- -- --   Estimated Creatinine Clearance: 73.3 ml/min (by C-G formula based on Cr of 1.06).  Medications:  Scheduled:     . aspirin EC  81 mg Oral Daily  . pantoprazole  40 mg Oral Q0600  . rosuvastatin  20 mg Oral q1800  . DISCONTD: pantoprazole (PROTONIX) IV  40 mg Intravenous Q24H   Infusions:     . dextrose 5 % and 0.9% NaCl 40 mL/hr at 09/05/11 1143  . feeding supplement (OSMOLITE 1.5 CAL)    . heparin 800 Units/hr (09/04/11 2108)  . 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  To begin Coumadin today, baseline INR 1.11 drawn today 12/14  Heparin level 0.52 on 800 units/hr. 2 levels in range at same rate.  No bleeding noted  Goal of Therapy:  Heparin level 0.3-0.7 units/ml INR 2-3   Plan:  Continue heparin at 800 units/hr. Coumadin 6mg  today, daily protimes ordered.  Chilton Si, Jos Cygan L 09/05/2011,1:37 PM

## 2011-09-05 NOTE — Progress Notes (Signed)
Subjective: F/Up Ab Pain, N/V and  branch vessel mesenteric venous thrombosis.  No apparent obstruction and no apparent bowel ischemia. Patient was admitted for bowel rest, hydration and anticoagulation pending further workup and treatment.  He was seen by Gen Surg and Vasc Surg (although I still do not see their note).   No n/V/D/Ab pain this am and he is feeling better    Objective: Vital signs in last 24 hours: Temp:  [98.1 F (36.7 C)-98.8 F (37.1 C)] 98.4 F (36.9 C) (12/14 0550) Pulse Rate:  [66-82] 78  (12/14 0550) Resp:  [18-20] 18  (12/14 0550) BP: (131-169)/(71-73) 147/73 mmHg (12/14 0550) SpO2:  [96 %-97 %] 97 % (12/14 0550) Weight:  [190 lb 6.4 oz (86.365 kg)] 190 lb 6.4 oz (86.365 kg) (12/13 1655) Weight change: 2 lb 6.4 oz (1.089 kg) Last BM Date: 09/04/11  PE) General appearance: alert and cooperative Throat: oropharynx moist without erythema Resp: clear to auscultation bilaterally Cardio: regular rate and rhythm, S1, S2 normal, no murmur, click, rub or gallop [id number], BS +,  R Ab PEG - CDI; no masses,  no organomegaly. No reb no guarding. BETTER Extremities: no clubbing, cyanosis or edema   Lab Results:  Basename 09/05/11 0325 09/04/11 0500  NA 140 137  K 3.8 3.7  CL 105 105  CO2 27 24  GLUCOSE 101* 93  BUN 10 16  CREATININE 1.06 1.00  CALCIUM 9.9 9.4  MG -- --  PHOS -- --    Basename 09/05/11 0325 09/03/11 1635  AST 20 25  ALT 12 15  ALKPHOS 32* 36*  BILITOT 0.5 0.5  PROT 6.6 8.2  ALBUMIN 3.6 4.5    Basename 09/05/11 0325 09/04/11 0500 09/03/11 1635  WBC 5.0 4.6 --  NEUTROABS -- -- 5.0  HGB 11.8* 11.1* --  HCT 33.7* 31.9* --  MCV 100.0 100.0 --  PLT 123* 124* --    Studies/Results: Ct Angio Abdomen W/cm &/or Wo Contrast  09/03/2011  *RADIOLOGY REPORT*  Clinical Data:  Vascular pathology right lower abdomen.  Assess for arterial versus venous.  Abdominal pain.  CT ANGIOGRAPHY ABDOMEN  Technique:  Multidetector CT imaging of the  abdomen was performed using the standard protocol during bolus administration of intravenous contrast.  Multiplanar reconstructed images including MIPs were obtained and reviewed to evaluate the vascular anatomy.  Contrast: 80mL OMNIPAQUE IOHEXOL 350 MG/ML IV SOLN  Comparison:  Same day  Findings:  The examination shows conclusively that the abnormal vessel represents a branch of the superior mesenteric vein rather than the superior mesenteric artery.  Being an arterial phase exam, I cannot assess with certainty the extent of the venous thrombosis but I do not that it involves the main superior mesenteric vein but only the branch vessel.  The aorta shows atherosclerotic change but no aneurysm.  Celiac origin is widely patent.  There is atherosclerotic plaque at the superior mesenteric artery origin but I do not think there is a flow-limiting stenosis.  I cannot identify a patent inferior mesenteric artery.  There is atherosclerotic plaque at both renal artery origins.  On the left, there could possibly be a flow limiting stenosis.  I would estimate this at 50-70%.  On the right, there could also be a flow limiting stenosis estimated at 50%.  The only other parenchymal finding that was not seen or appreciated on the initial scan is the presence of some small mesenteric lymph nodes, none larger than 4 mm in size.  These appear normally distributed  and I think are normal structures.  Review of the MIP images confirms the above findings.  IMPRESSION: Abdominal CT angiography demonstrates that the abnormal vessel extending in the right lower quadrant mesentery is a vein rather than the artery.  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  Bilateral renal artery stenoses estimated at 50-70%.  Original Report Authenticated By: Thomasenia Sales, M.D.   Ct Abdomen Pelvis W Contrast  09/03/2011  *RADIOLOGY REPORT*  Clinical Data: Abdominal pain.  CT ABDOMEN AND PELVIS WITH CONTRAST  Technique:   Multidetector CT imaging of the abdomen and pelvis was performed following the standard protocol during bolus administration of intravenous contrast.  Contrast: OMNIPAQUE IOHEXOL 300 MG/ML IV SOLN  Comparison: None.  Findings: Lung bases are clear.  No pleural or pericardial fluid.  The liver appears normal.  No calcified gallstones.  The spleen is normal.  The pancreas is normal.  The adrenal glands are normal. Both kidneys are normal.  The aorta shows atherosclerotic change but no aneurysm.  The IVC is normal.  No retroperitoneal mass or adenopathy.  No free intraperitoneal fluid or air.  Peg tube has good appearance.  There is some stranding around branches of the mesenteric vessels extending towards the right lower quadrant. I think this could represent branch vessel superior mesenteric vein thrombosis or embolus or thrombosis of a superior mesenteric arterial branch.  There is no portal vein thrombosis.  No primary bowel pathology is discernible.  There is a normal appearing appendix.  No free fluid in the pelvis.  Bladder appears normal. Ordinary degenerative changes present in the lower lumbar spine.  IMPRESSION: Abnormal mesenteric vessels extending towards the right lower quadrant.  I think the differential diagnosis is branch vessels appear mesenteric vein thrombosis versus branch vessel arterial thrombosis or embolus.  I think it is difficult to be absolutely certain which we are dealing with.  Certainly there is no evidence of bowel infarction or edema.  Original Report Authenticated By: Thomasenia Sales, M.D.     Medications: Scheduled:    . aspirin EC  81 mg Oral Daily  . pantoprazole (PROTONIX) IV  40 mg Intravenous Q24H   Continuous:    . dextrose 5 % and 0.9% NaCl 50 mL/hr at 09/04/11 0509  . heparin 800 Units/hr (09/04/11 2108)  . DISCONTD: sodium chloride 125 mL/hr at 09/03/11 2205  . DISCONTD: heparin 12.18 Units/kg/hr (09/04/11 1100)  . DISCONTD: heparin       Assessment/Plan: Principal Problem:  *Abdominal pain, acute, periumbilical Active Problems:  Ischemic heart disease  Hypercholesterolemia  Aortic stenosis  Protein-calorie malnutrition, moderate  Oropharyngeal cancer  Renal artery stenosis, native, bilateral  Thrombosis of mesenteric vein   Assessment/Plan  Present on Admission:  .Renal artery stenosis, native, bilateral-an incidental finding. Vascular surgery can comment if any intervention should be done at this time. Given normal BP & renal function, this likely could just be watched. If he does stay on long term anti-coag, that may help this issue.   .Abdominal pain, acute - from thrombus. BETTER  .Ischemic heart disease-stable. No need for echo since this appears to be venous in nature.  .Hypercholesterolemia-stable   .Aortic stenosis-stable, watch IVF being given   .Protein-calorie malnutrition, moderate-making him NPO for now.  Continue D5 but OK to lower.  .Oropharyngeal cancer-finished treatment. Currently on tube feeds with some oral supplement intake.   Continue DNR.  Follow up Dr Gaylyn Rong as OutPt.  .Thrombosis of mesenteric vein-likely cause is patient's hypercoaguable  state from his cancer. Continue Heparin gtt and start Coumadin today - Goal 2.0-3.0 for @ 6 months - length of time ultimately per Dr Gaylyn Rong.  Start POs today. Follow up on Thrombophilia profile.  Hydrate.  Start trickle tube feeds tomorrow - he normally take 3 canes per day and trickles 4 cans overnight. Still need Vasc Surgery input.  Sx and Pain is under control.  Lactate was (-)   He needs a repeat CT angio with a VENOUS phase to clarify question of superior mesenteric vein thrombus vs branch vessel.  Will consult Vasc surgery again at this point and they can decide on timing of CT.  meds changed to oral and possible challenge with increased diet tomorrow.       Disposition: once workup done & symptoms managed, potentially possible patient could go  home on Lovenox & coumadin in a few days with close outpatient follow-up.        LOS: 2 days   Sriram Febles M 09/05/2011, 6:50 AM

## 2011-09-05 NOTE — Consult Note (Signed)
[facility]   Referred by:  Dr. 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 male with known oropharyngeal cancer with PEG tube who presents with chief complaint: abdominal pain.  Three day days ago, patient notes onset of abdominal pain described as a band constricting across his lower quadrants.  Now he notes the pain has moved to this LUQ where his PEG is located.  The patient denies any hematochezia, melena, or hematomesis.  He notes no recent BM.  He currently denies any nausea or vomiting.  His pain currently is  Mild to Moderate with pain rx.  Past Medical History  Diagnosis Date  . Ischemic heart disease   . Aortic stenosis, mild   . Hyperlipidemia   . Insomnia   . Fatigue   . Dizziness   . Hypotension     orthostatic  . History of echocardiogram 02/09/2009    EF -  55-60%  . History of cardiovascular stress test 03/14/2009    EF - 57%   /  mild anterior hyperkinesia with discrete anterior scar consistent with previous infarction but unchanged from previous studies.  Overall, stable findings -- Colleen Can. Deborah Chalk, MD  . Weakness   . ASCVD (arteriosclerotic cardiovascular disease)   . Arrhythmia     known history of  . Weight loss, abnormal 2012  . 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 Protein-calorie malnutrition, moderate 2012    on PEG tube feeding  . Cellulitis of artificial external opening, post-operative 2012    of PEG tube site    Past Surgical History  Procedure Date  . 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 Cardiac catheterization 2007    EF - 55%  . Ablation of dysrhythmic focus   . Esophagogastroduodenoscopy 04/24/2011    Procedure: ESOPHAGOGASTRODUODENOSCOPY (EGD);  Surgeon: Malissa Hippo, MD;  Location: [facility];  Service: Endoscopy;  Laterality: N/A;  8:30 am  . Peg placement 04/24/2011    Procedure: PERCUTANEOUS ENDOSCOPIC GASTROSTOMY (PEG) PLACEMENT;   Surgeon: Malissa Hippo, MD;  Location: [facility];  Service: Endoscopy;  Laterality: N/A;    History   Social History  . Marital Status: Married    Spouse Name: N/A    Number of Children: N/A  . Years of Education: N/A   Occupational History  . Not on file.   Social History Main Topics  . Smoking status: Former Smoker    Types: Cigars  . Smokeless tobacco: Former Neurosurgeon    Quit date: 05/06/2011   Comment: 2 cigars a week  . Alcohol Use: Yes     occasionally glass of wine  . Drug Use: No  . Sexually Active: No   Other Topics Concern  . Not on file   Social History Narrative  . No narrative on file    Family History  Problem Relation Age of Onset  . Heart disease Father   . Heart disease Brother   . Heart disease Sister   . Cancer Sister     breast cancer  . Cancer Mother     breast ca    No current facility-administered medications on file prior to encounter.   Current Outpatient Prescriptions on File Prior to Encounter  Medication Sig Dispense Refill  . diphenhydramine-acetaminophen (TYLENOL PM) 25-500 MG TABS Take 0.5-1 tablets by mouth at bedtime as needed. For sleep.      Marland Kitchen  HYDROcodone-acetaminophen (LORTAB) 7.5-500 MG/15ML solution Take 15 mLs by mouth every 4 (four) hours as needed. For pain.      . pantoprazole (PROTONIX) 40 MG tablet Take 1 tablet (40 mg total) by mouth daily.  30 tablet  5  . rosuvastatin (CRESTOR) 20 MG tablet Take 20 mg by mouth daily.        Marland Kitchen zolpidem (AMBIEN) 10 MG tablet Take 5-10 mg by mouth at bedtime as needed. For sleep.        No Known Allergies  Review of Systems (Positive items checked otherwise negative)  General: [ ]  Weight loss, [ ]  Weight gain, [x]   Loss of appetite, [ ]  Fever  Neurologic: [ ]  Dizziness, [ ]  Blackouts, [ ]  Headaches, [ ]  Seizure  Ear/Nose/Throat: [ ]  Change in eyesight, [ ]  Change in hearing, [ ]  Nose bleeds, [ ]  Sore throat  Vascular: [ ]  Pain in legs with walking, [ ]  Pain in feet while  lying flat, [ ]  Non-healing ulcer, Stroke, [ ]  "Mini stroke", [ ]  Slurred speech, [ ]  Temporary blindness, [x]  Blood clot in vein, [ ]  Phlebitis  Pulmonary: [ ]  Home oxygen, [ ]  Productive cough, [ ]  Bronchitis, [ ]  Coughing up blood,  [ ]  Asthma, [ ]  Wheezing  Musculoskeletal: [ ]  Arthritis, [ ]  Joint pain, [ ]  Muscle pain  Cardiac: [ ]  Chest pain, [ ]  Chest tightness/pressure, [ ]  Shortness of breath when lying flat, [ ]  Shortness of breath with exertion, [ ]  Palpitations, [ ]  Heart murmur, [ ]  Arrythmia,  [ ]  Atrial fibrillation  Hematologic: [ ]  Bleeding problems, [ ]  Clotting disorder, [ ]  Anemia  Psychiatric:  [ ]  Depression, [ ]  Anxiety, [ ]  Attention deficit disorder  Gastrointestinal:  [ ]  Black stool,[ ]   Blood in stool, [ ]  Peptic ulcer disease, [ ]  Reflux, [ ]  Hiatal hernia, [x]  Trouble swallowing, [ ]  Diarrhea, [x]  Constipation  Urinary:  [ ]  Kidney disease, [ ]  Burning with urination, [ ]  Frequent urination, [ ]  Difficulty urinating  Skin: [ ]  Ulcers, [ ]  Rashes   Physical Examination  Filed Vitals:   09/04/11 1655 09/04/11 2154 09/05/11 0550 09/05/11 1433  BP: 169/72 131/71 147/73 155/82  Pulse: 66 82 78 80  Temp: 98.1 F (36.7 C) 98.8 F (37.1 C) 98.4 F (36.9 C) 99.4 F (37.4 C)  TempSrc: Oral Oral Oral Oral  Resp: 20 19 18 18   Height: 6\' 1"  (1.854 m)     Weight: 190 lb 6.4 oz (86.365 kg)     SpO2: 97% 96% 97% 93%   Body mass index is 25.12 kg/(m^2).  General: A&O x 3, WDWN, cachectic  Head: [place]/AT  Ear/Nose/Throat: Hearing grossly intact, nares w/o erythema or drainage, oropharynx w/o Erythema/Exudate  Eyes: PERRLA, EOMI  Neck: Supple, no nuchal rigidity, no palpable LAD  Pulmonary: Sym exp, good air movt, CTAB, no rales, rhonchi, & wheezing  Cardiac: RRR, Nl S1, S2, no Murmurs, rubs or gallops  Vascular: Vessel Right Left  Radial Palpable Palpable  Brachial Palpable Palpable  Carotid Palpable, without bruit Palpable, without bruit  Aorta  Non-palpable N/A  Femoral Palpable Palpable  Popliteal Non-palpable Non-palpable  PT Palpable Palpable  DP Palpable Palpable   Gastrointestinal: soft, NTND, -G/R, - HSM, - masses, - CVAT B, LUQ PEG, no drainage around PEG  Musculoskeletal: M/S 5/5 throughout , Extremities without ischemic changes   Neurologic: CN 2-12 intact , Pain and light touch intact in extremities , Motor exam  as listed above  Psychiatric: Judgment intact, Mood & affect appropriate for pt's clinical situation  Dermatologic: See M/S exam for extremity exam, no rashes otherwise noted  Lymph : No Cervical, Axillary, or Inguinal lymphadenopathy   Ct Angio Abdomen W/cm &/or Wo Contrast  09/03/2011  *RADIOLOGY REPORT*  Clinical Data:  Vascular pathology right lower abdomen.  Assess for arterial versus venous.  Abdominal pain.  CT ANGIOGRAPHY ABDOMEN  Technique:  Multidetector CT imaging of the abdomen was performed using the standard protocol during bolus administration of intravenous contrast.  Multiplanar reconstructed images including MIPs were obtained and reviewed to evaluate the vascular anatomy.  Contrast: 80mL OMNIPAQUE IOHEXOL 350 MG/ML IV SOLN  Comparison:  Same day  Findings:  The examination shows conclusively that the abnormal vessel represents a branch of the superior mesenteric vein rather than the superior mesenteric artery.  Being an arterial phase exam, I cannot assess with certainty the extent of the venous thrombosis but I do not that it involves the main superior mesenteric vein but only the branch vessel.  The aorta shows atherosclerotic change but no aneurysm.  Celiac origin is widely patent.  There is atherosclerotic plaque at the superior mesenteric artery origin but I do not think there is a flow-limiting stenosis.  I cannot identify a patent inferior mesenteric artery.  There is atherosclerotic plaque at both renal artery origins.  On the left, there could possibly be a flow limiting stenosis.  I would  estimate this at 50-70%.  On the right, there could also be a flow limiting stenosis estimated at 50%.  The only other parenchymal finding that was not seen or appreciated on the initial scan is the presence of some small mesenteric lymph nodes, none larger than 4 mm in size.  These appear normally distributed and I think are normal structures.  Review of the MIP images confirms the above findings.  IMPRESSION: Abdominal CT angiography demonstrates that the abnormal vessel extending in the right lower quadrant mesentery is a vein rather than the artery.  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  Bilateral renal artery stenoses estimated at 50-70%.  Original Report Authenticated By: Thomasenia Sales, M.D.   Ct Abdomen Pelvis W Contrast  09/03/2011  *RADIOLOGY REPORT*  Clinical Data: Abdominal pain.  CT ABDOMEN AND PELVIS WITH CONTRAST  Technique:  Multidetector CT imaging of the abdomen and pelvis was performed following the standard protocol during bolus administration of intravenous contrast.  Contrast: OMNIPAQUE IOHEXOL 300 MG/ML IV SOLN  Comparison: None.  Findings: Lung bases are clear.  No pleural or pericardial fluid.  The liver appears normal.  No calcified gallstones.  The spleen is normal.  The pancreas is normal.  The adrenal glands are normal. Both kidneys are normal.  The aorta shows atherosclerotic change but no aneurysm.  The IVC is normal.  No retroperitoneal mass or adenopathy.  No free intraperitoneal fluid or air.  Peg tube has good appearance.  There is some stranding around branches of the mesenteric vessels extending towards the right lower quadrant. I think this could represent branch vessel superior mesenteric vein thrombosis or embolus or thrombosis of a superior mesenteric arterial branch.  There is no portal vein thrombosis.  No primary bowel pathology is discernible.  There is a normal appearing appendix.  No free fluid in the pelvis.  Bladder  appears normal. Ordinary degenerative changes present in the lower lumbar spine.  IMPRESSION: Abnormal mesenteric vessels extending towards the right lower quadrant.  I think the differential diagnosis is branch vessels appear mesenteric vein thrombosis versus branch vessel arterial thrombosis or embolus.  I think it is difficult to be absolutely certain which we are dealing with.  Certainly there is no evidence of bowel infarction or edema.  Original Report Authenticated By: Thomasenia Sales, M.D.   I reviewed this patient's abdominal and pelvic CTA.  There is perfusion in the Celiac axis and SMA.  He has extensive atherosclerotic disease in the aorta and iliac systems, including B renal artery orifical stenosis.  There are no frank signs of ischemic bowel.  CBC    Component Value Date/Time   WBC 5.0 09/05/2011 0325   WBC 5.3 07/25/2011 1023   RBC 3.37* 09/05/2011 0325   RBC 3.53* 07/25/2011 1023   HGB 11.8* 09/05/2011 0325   HGB 11.7* 07/25/2011 1023   HCT 33.7* 09/05/2011 0325   HCT 33.9* 07/25/2011 1023   PLT 123* 09/05/2011 0325   PLT 128* 07/25/2011 1023   MCV 100.0 09/05/2011 0325   MCV 96.0 07/25/2011 1023   MCH 35.0* 09/05/2011 0325   MCH 33.1 07/25/2011 1023   MCHC 35.0 09/05/2011 0325   MCHC 34.5 07/25/2011 1023   RDW 13.2 09/05/2011 0325   RDW 17.1* 07/25/2011 1023   LYMPHSABS 0.9 09/03/2011 1635   LYMPHSABS 0.7* 07/25/2011 1023   MONOABS 0.9 09/03/2011 1635   MONOABS 0.8 07/25/2011 1023   EOSABS 0.0 09/03/2011 1635   EOSABS 0.1 07/25/2011 1023   BASOSABS 0.0 09/03/2011 1635   BASOSABS 0.0 07/25/2011 1023    BMET    Component Value Date/Time   NA 140 09/05/2011 0325   K 3.8 09/05/2011 0325   CL 105 09/05/2011 0325   CO2 27 09/05/2011 0325   GLUCOSE 101* 09/05/2011 0325   BUN 10 09/05/2011 0325   BUN 15 04/28/2011 0950   CREATININE 1.06 09/05/2011 0325   CREATININE 1.0 04/28/2011 0950   CALCIUM 9.9 09/05/2011 0325   GFRNONAA 69* 09/05/2011 0325   GFRAA 80* 09/05/2011 0325    Medical Decision Making  REYN FAIVRE is a 70 y.o. 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 anticoagulation.  Additionally, due to the nature of the mesenteric venous system, it is impossible to easily gain access to the mesenteric venous system for direct catheter delivery of thrombolytic agents.  In these patients, standard of case is anticoagulation, serial abdominal exams, and attempt to identify source of the thrombophilia, frequently either an intraabdominal process, cancer, or clotting disorder.  Currently, this patient's abdomen is benign.  If this patient develops acute abdomen, general surgery evaluation for bowel resection is necessary.  There is no surgical role for vascular surgery in these cases.  He can follow up as an outpatient in my office for B renal artery duplexes (not done in the hospital).  Thank you for allowing [place] to participate in this patient's care.  Leonides Sake, MD [facility] [place] Office: [phone] Pager: [phone]  09/05/2011, 3:06 PM

## 2011-09-05 NOTE — Progress Notes (Signed)
Pt declined increase in rate of tube feeding due to feeling full.

## 2011-09-06 LAB — COMPREHENSIVE METABOLIC PANEL
Albumin: 3.4 g/dL — ABNORMAL LOW (ref 3.5–5.2)
Alkaline Phosphatase: 31 U/L — ABNORMAL LOW (ref 39–117)
BUN: 10 mg/dL (ref 6–23)
Calcium: 9.7 mg/dL (ref 8.4–10.5)
Creatinine, Ser: 0.99 mg/dL (ref 0.50–1.35)
GFR calc Af Amer: >90 mL/min (ref >90)
Glucose, Bld: 116 mg/dL — ABNORMAL HIGH (ref 70–99)
Potassium: 3.3 mEq/L — ABNORMAL LOW (ref 3.5–5.1)
Total Protein: 6.4 g/dL (ref 6.0–8.3)

## 2011-09-06 LAB — CBC
HCT: 33 % — ABNORMAL LOW (ref 39.0–52.0)
Hemoglobin: 11.5 g/dL — ABNORMAL LOW (ref 13.0–17.0)
MCH: 34.3 pg — ABNORMAL HIGH (ref 26.0–34.0)
MCHC: 34.8 g/dL (ref 30.0–36.0)
MCV: 98.5 fL (ref 78.0–100.0)
RDW: 13.1 % (ref 11.5–15.5)

## 2011-09-06 LAB — HEPARIN LEVEL (UNFRACTIONATED): Heparin Unfractionated: 0.39 IU/mL (ref 0.30–0.70)

## 2011-09-06 LAB — PROTIME-INR: Prothrombin Time: 15.5 seconds — ABNORMAL HIGH (ref 11.6–15.2)

## 2011-09-06 MED ORDER — BACITRACIN-NEOMYCIN-POLYMYXIN 400-5-5000 EX OINT
TOPICAL_OINTMENT | CUTANEOUS | Status: AC
  Administered 2011-09-06: 15:00:00
  Filled 2011-09-06: qty 1

## 2011-09-06 MED ORDER — WARFARIN SODIUM 6 MG PO TABS
6.0000 mg | ORAL_TABLET | Freq: Once | ORAL | Status: AC
  Administered 2011-09-06: 6 mg via ORAL
  Filled 2011-09-06: qty 1

## 2011-09-06 MED ORDER — POTASSIUM CHLORIDE 20 MEQ/15ML (10%) PO LIQD
20.0000 meq | Freq: Two times a day (BID) | ORAL | Status: AC
  Administered 2011-09-06 (×2): 20 meq
  Filled 2011-09-06 (×2): qty 15

## 2011-09-06 NOTE — Progress Notes (Signed)
[facility] [place]  Daily Progress Note  Assessment/Planning: Mesenteric venous thrombosis  Exam is better and pt appears to be tolerating TF tonight  Continue anticoagulation -> Coumadin  Available as needed  Subjective    "Feeling better", +F/+BM  Objective Filed Vitals:   09/05/11 1433 09/05/11 2106 09/06/11 0539 09/06/11 1445  BP: 155/82 151/78 118/71 142/76  Pulse: 80 69 65 68  Temp: 99.4 F (37.4 C) 98.4 F (36.9 C) 98.7 F (37.1 C) 98.7 F (37.1 C)  TempSrc: Oral Oral  Oral  Resp: 18 18 18 18   Height:      Weight:      SpO2: 93% 97% 96% 96%    Intake/Output Summary (Last 24 hours) at 09/06/11 1950 Last data filed at 09/06/11 1400  Gross per 24 hour  Intake 2424.5 ml  Output   1000 ml  Net 1424.5 ml    PULM  CTAB CV  RRR GI  soft, NTND, TF @ 50 cc/hr  Laboratory CBC    Component Value Date/Time   WBC 4.9 09/06/2011 0340   WBC 5.3 07/25/2011 1023   HGB 11.5* 09/06/2011 0340   HGB 11.7* 07/25/2011 1023   HCT 33.0* 09/06/2011 0340   HCT 33.9* 07/25/2011 1023   PLT 127* 09/06/2011 0340   PLT 128* 07/25/2011 1023    BMET    Component Value Date/Time   NA 140 09/06/2011 0340   K 3.3* 09/06/2011 0340   CL 105 09/06/2011 0340   CO2 28 09/06/2011 0340   GLUCOSE 116* 09/06/2011 0340   BUN 10 09/06/2011 0340   BUN 15 04/28/2011 0950   CREATININE 0.99 09/06/2011 0340   CREATININE 1.0 04/28/2011 0950   CALCIUM 9.7 09/06/2011 0340   GFRNONAA 81* 09/06/2011 0340   GFRAA >90 09/06/2011 0340    Leonides Sake, MD [facility] [place] Office: [phone] Pager: [phone]  09/06/2011, 7:50 PM

## 2011-09-06 NOTE — Progress Notes (Signed)
Pt states that his urine contained a small amount of blood. No blood seen upon examination of the urinal.

## 2011-09-06 NOTE — Progress Notes (Signed)
Subjective: Patient not feeling well, somewhat weak, nauseous as a tube feeds go in, is using pain medications as well to help with the pain, much better compared with initial presentation, no overt vomiting. Denies any breathing discomfort or chest pain. Denies any shaking chills multiple visitors today.  Objective: Vital signs in last 24 hours: Temp:  [98.4 F (36.9 C)-99.4 F (37.4 C)] 98.7 F (37.1 C) (12/15 0539) Pulse Rate:  [65-80] 65  (12/15 0539) Resp:  [18] 18  (12/15 0539) BP: (118-155)/(71-82) 118/71 mmHg (12/15 0539) SpO2:  [93 %-97 %] 96 % (12/15 0539) Weight change:  Last BM Date: 09/06/11  CBG (last 3)  No results found for this basename: GLUCAP:3 in the last 72 hours  Intake/Output from previous day: 12/14 0701 - 12/15 0700 In: 1257.2 [P.O.:180; I.V.:1077.2] Out: 350 [Urine:350] Intake/Output this shift: Total I/O In: 780.3 [I.V.:780.3] Out: 650 [Urine:650]  Physical exam Alert cooperative answering all questions appropriately, no apparent distress, pleasant No oropharyngeal lesions, moist, Lungs clear to auscultation bilaterally Cardiovascular exam reveals distant heart sounds but regular rate and rhythm no murmur appreciated Abdomen with PEG tube clean dry and intact, soft, nondistended, nontender No edema, pedal pulses intact, no cyanosis    Lab Results:  [facility] 09/06/11 0340 09/05/11 0325  NA 140 140  K 3.3* 3.8  CL 105 105  CO2 28 27  GLUCOSE 116* 101*  BUN 10 10  CREATININE 0.99 1.06  CALCIUM 9.7 9.9  MG -- --  PHOS -- --    Basename 09/06/11 0340 09/05/11 0325  AST 19 20  ALT 11 12  ALKPHOS 31* 32*  BILITOT 0.3 0.5  PROT 6.4 6.6  ALBUMIN 3.4* 3.6    Basename 09/06/11 0340 09/05/11 0325 09/03/11 1635  WBC 4.9 5.0 --  NEUTROABS -- -- 5.0  HGB 11.5* 11.8* --  HCT 33.0* 33.7* --  MCV 98.5 100.0 --  PLT 127* 123* --   No results found for this basename: CKTOTAL:3,CKMB:3,CKMBINDEX:3,TROPONINI:3 in the last 72 hours No results  found for this basename: TSH,T4TOTAL,FREET3,T3FREE,THYROIDAB in the last 72 hours No results found for this basename: VITAMINB12:2,FOLATE:2,FERRITIN:2,TIBC:2,IRON:2,RETICCTPCT:2 in the last 72 hours  Studies/Results: No results found.   Medications: Scheduled:   . aspirin EC  81 mg Oral Daily  . neomycin-bacitracin-polymyxin      . pantoprazole  40 mg Oral Q0600  . rosuvastatin  20 mg Oral q1800  . warfarin  6 mg Oral ONCE-1800  . warfarin  6 mg Oral ONCE-1800   Continuous:   . dextrose 5 % and 0.9% NaCl 40 mL/hr at 09/06/11 1350  . feeding supplement (OSMOLITE 1.5 CAL) 1,000 mL (09/05/11 1410)  . heparin 800 Units/hr (09/06/11 0529)    Assessment/Plan: Principal Problem:  *Abdominal pain, acute, periumbilical secondary to thrombosis of the mesenteric vein probably as a result of hypercoagulable state from his malignancy. On heparin drip with initiation of Coumadin x2 days, subtherapeutic PT/INR. Active Problems:   Aortic stenosis no change-no evidence of volume overload  Protein-calorie malnutrition, moderate-not tolerating full tube feeds as of yet, will continue approach, question the issue with narcotic slowing down bowel motility, complicated by the mesenteric thrombosis, may need bowel motility agents, question the use of Reglan, hopefully this will improve as anticoagulation continues. Will replete potassium  Oropharyngeal cancer per oncology  Renal artery stenosis, native, bilateral-renal parameters normal, outpatient followup planned  Thrombosis of mesenteric vein-INR subtherapeutic at 1.2, continue Coumadin per pharmacy protocol Disposition-patient doesn't feel adequate for discharge at this time, certainly  concerned with tolerability of tube feeds and the need to correct protein calorie malnutrition.    LOS: 3 days   Kenyata Guess R 09/06/2011, 1:54 PM

## 2011-09-06 NOTE — Progress Notes (Signed)
Pt requests TF be increased to 50cc/hr. Pt states he is feeling much better regarding his nausea. 0ml residual in PEG. I increased to TF as the patient requested, and asked him to call me if any nausea develops.

## 2011-09-06 NOTE — Progress Notes (Signed)
Pt. Complained of nausea and continues after zofran  given, no emesis noted. Request tube feeds be decreased. Residual = 0. Decreased tube feeds back to 40 cc/hour. Continue to monitor.

## 2011-09-06 NOTE — Progress Notes (Signed)
Instructed pt. Goal of tube feeds is 75 cc/hour. Residual = 0.  Increased Tube feeds to 50 cc/hour. Continue to assess and monitor

## 2011-09-06 NOTE — Progress Notes (Signed)
Feeding tube rate increased to 40 mL/hr at 23:00 per pt's request. Pt declined to have rate increased to 60 mL/hr due to feelings of fullness.

## 2011-09-06 NOTE — Progress Notes (Signed)
ANTICOAGULATION CONSULT NOTE - Follow Up Consult  Pharmacy Consult for Heparin/Warfarin  Indication: Mesenteric vein thrombosis  No Known Allergies  Patient Measurements: Height: 6\' 1"  (185.4 cm) Weight: 190 lb 6.4 oz (86.365 kg) IBW/kg (Calculated) : 79.9   Labs:  Basename 09/06/11 0340 09/05/11 1220 09/05/11 0930 09/05/11 0325 09/05/11 0139 09/04/11 0500  HGB 11.5* -- -- 11.8* -- --  HCT 33.0* -- -- 33.7* -- 31.9*  PLT 127* -- -- 123* -- 124*  APTT -- -- -- -- -- --  LABPROT 15.5* 14.5 -- -- -- --  INR 1.20 1.11 -- -- -- --  HEPARINUNFRC 0.39 -- 0.52 -- 0.50 --  CREATININE 0.99 -- -- 1.06 -- 1.00  CKTOTAL -- -- -- -- -- --  CKMB -- -- -- -- -- --  TROPONINI -- -- -- -- -- --   Estimated Creatinine Clearance: 78.5 ml/min (by C-G formula based on Cr of 0.99).  Medications:  Scheduled:     . aspirin EC  81 mg Oral Daily  . pantoprazole  40 mg Oral Q0600  . rosuvastatin  20 mg Oral q1800  . warfarin  6 mg Oral ONCE-1800   Infusions:     . dextrose 5 % and 0.9% NaCl 40 mL/hr at 09/05/11 1143  . feeding supplement (OSMOLITE 1.5 CAL) 1,000 mL (09/05/11 1410)  . heparin 800 Units/hr (09/06/11 0529)   Assessment:  70 yo male with mesenteric vein thrombosis.  Today is Day 2 of Coumadin/Heparin bridge  INR is 1.20, initiation phase and heparin level has been therapeutic at heparin 800 units/hr  RN reported that patient noticed small amount of blood in his urine but no blood in the urinal upon RN's examination   CSS stated no surgical role in these cases and recommended continuing anticoagulation.  Coumadin score = 7  CBC stable  Goal of Therapy:  Heparin level 0.3-0.7 units/ml INR 2-3    Plan:   Continue heparin at 800 units/hr  Repeat Coumadin 6 mg po x 1 tonight  F/u for s/sx of bleeding, heparin level, INR, and coumadin education.    Geoffry Paradise Thi 09/06/2011,7:22 AM

## 2011-09-07 LAB — CBC
MCV: 99.4 fL (ref 78.0–100.0)
Platelets: 120 10*3/uL — ABNORMAL LOW (ref 150–400)
RBC: 3.26 MIL/uL — ABNORMAL LOW (ref 4.22–5.81)
RDW: 13.2 % (ref 11.5–15.5)
WBC: 4.4 10*3/uL (ref 4.0–10.5)

## 2011-09-07 LAB — PROTIME-INR
INR: 1.68 — ABNORMAL HIGH (ref 0.00–1.49)
Prothrombin Time: 20.1 seconds — ABNORMAL HIGH (ref 11.6–15.2)

## 2011-09-07 LAB — COMPREHENSIVE METABOLIC PANEL
ALT: 17 U/L (ref 0–53)
AST: 24 U/L (ref 0–37)
Albumin: 3.4 g/dL — ABNORMAL LOW (ref 3.5–5.2)
Calcium: 9.6 mg/dL (ref 8.4–10.5)
Chloride: 107 mEq/L (ref 96–112)
Creatinine, Ser: 0.96 mg/dL (ref 0.50–1.35)
Sodium: 142 mEq/L (ref 135–145)
Total Bilirubin: 0.3 mg/dL (ref 0.3–1.2)

## 2011-09-07 MED ORDER — WARFARIN SODIUM 5 MG PO TABS
5.0000 mg | ORAL_TABLET | Freq: Once | ORAL | Status: AC
  Administered 2011-09-07: 5 mg via ORAL
  Filled 2011-09-07: qty 1

## 2011-09-07 MED ORDER — LORAZEPAM 0.5 MG PO TABS: 0.5000 mg | ORAL_TABLET | Freq: Three times a day (TID) | ORAL | Status: AC | PRN

## 2011-09-07 MED ORDER — ENOXAPARIN SODIUM 100 MG/ML ~~LOC~~ SOLN
1.0000 mg/kg | Freq: Two times a day (BID) | SUBCUTANEOUS | Status: DC
  Administered 2011-09-07: 85 mg via SUBCUTANEOUS
  Filled 2011-09-07 (×4): qty 1

## 2011-09-07 MED ORDER — ENOXAPARIN SODIUM 100 MG/ML ~~LOC~~ SOLN: 1.0000 mg/kg | Freq: Two times a day (BID) | SUBCUTANEOUS | Status: DC

## 2011-09-07 MED ORDER — WARFARIN SODIUM 5 MG PO TABS: 5.0000 mg | ORAL_TABLET | Freq: Once | ORAL | Status: DC

## 2011-09-07 NOTE — Discharge Summary (Signed)
DISCHARGE SUMMARY  George Bradley  MR#: [id number]  DOB:[birth year]  Date of Admission: 09/03/2011 Date of Discharge: 09/07/2011  Attending Physician:[name]  Patient's LKG:[name],[name], MD, MD  Consults:  Anticoagulation next line vascular surgery nutritional management  Discharge Diagnoses: Principal Problem:  *Abdominal pain, acute, periumbilical Active Problems:  Ischemic heart disease  Hypercholesterolemia  Aortic stenosis  Protein-calorie malnutrition, moderate  Oropharyngeal cancer  Renal artery stenosis, native, bilateral  Thrombosis of mesenteric vein   Discharge Medications: Current Discharge Medication List    START taking these medications   Details  enoxaparin (LOVENOX) 100 MG/ML SOLN Inject 0.85 mLs (85 mg total) into the skin every 12 (twelve) hours. Qty: 10 Syringe, Refills: 0    LORazepam (ATIVAN) 0.5 MG tablet Take 1 tablet (0.5 mg total) by mouth every 8 (eight) hours as needed for anxiety (Stomach Pain,Anxiety). Qty: 30 tablet, Refills: 0    warfarin (COUMADIN) 5 MG tablet Take 1 tablet (5 mg total) by mouth one time only at 6 PM. Qty: 30 tablet, Refills: 0      CONTINUE these medications which have NOT CHANGED   Details  aspirin EC 81 MG tablet Take 81 mg by mouth daily.      diphenhydramine-acetaminophen (TYLENOL PM) 25-500 MG TABS Take 0.5-1 tablets by mouth at bedtime as needed. For sleep.   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 For pain.    Nutritional Supplements (MELATONIN PO) Take 1 tablet by mouth daily.      pantoprazole (PROTONIX) 40 MG tablet Take 1 tablet (40 mg total) by mouth daily. Qty: 30 tablet, Refills: 5    rosuvastatin (CRESTOR) 20 MG tablet Take 20 mg by mouth daily.      zolpidem (AMBIEN) 10 MG tablet Take 5-10 mg by mouth at bedtime as needed. For sleep.   Associated Diagnoses:  Squamous cell cancer of tongue        History of Present Illness: Patient is a 70 yo white male w/past med history of ischemic heart disease who just recently completed treatment for oropharyngeal cancer who comes in complaining of abdominal pain. For the past 3 days, the patient has had continuous 9-10/10 abdominal pain located below mid-umbilicus region. It has not been associated with food (either tube feeds or liquid shake by po). There is no relief. He has had minimal vomiting. No other complaints. He came in to the ER as this was not improving.  Lab work was normal, so patient underwent a CT of the abdomen & pelvis. The findings noted Abnormal mesenteric vessels extending towards the right lower  Quadrant with the differential diagnosis is branch vessel mesenteric vein thrombosis versus branch vessel arterial thrombosis or embolus.  General surgery was consulted who reviewed the initial CT and recommended discussion with Vascular Surgery. Vascular surgery recommended a CT angio of the abd/pelvis with arterial phase. This noted that the abnormal vessel  extending in the right lower quadrant mesentery is a vein rather than the artery. 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  On a side note, Bilateral renal artery stenoses estimated at 50-70% were noted.      Hospital Course: Patient diagnosed with mesenteric venous thrombosis, no apparent obstruction or bowel ischemia, admitted for bowel rest hydration and anticoagulation workup. Also seen by general surgery and vascular surgery during hospitalization. Was reinitiated on tube feedings however had a difficult time adjusting  to the tube feedings with feelings of fullness and abdominal discomfort. CT of the abdomen and angiogram were reviewed revealing mesenteric vein thrombosis, likely secondary to the patient's hypercoagulable state from his cancer. Heparin drip continued, Coumadin started on December 14, goal  being to-3, length of time to be determined by oncology, started on caloric intake but marginal, feelings of fullness, some nausea initiated however assault with administration of Ativan. I appropriate for discharge on December 15 however by December 16, tolerating tube feeds at 60 cc a minute, minimal nausea and abdominal discomfort, no bleeding complications noted. Discussed the need for anticoagulation and teaching family how to use subcutaneous Lovenox until his PT/INR is therapeutic with Coumadin. Vascular surgery did see the patient and noted mesenteric vein thrombosis, also noted that he is being initiated on Coumadin with heparin bridge, noted that there's never been proven to be any advantage to treating this issue with thrombectomy versus anticoagulation. They further acknowledge that this would be extremely difficult through the venous system and they recommended anticoagulation and serial [name] exams they also noted that the patient's abdomen is benign and there is no surgical role for vascular surgery in these cases. They also recommended outpatient followup for bilateral renal artery stenosis that was marginal. She was deemed appropriate for discharge on December 16 with subcutaneous Lovenox as a bridge until PT/INR becomes therapeutic, and patient and spouse who is a physical therapist will work on increasing caloric intake at home using his feeding tube. Day of Discharge Exam BP 143/78  Pulse 64  Temp(Src) 99 F (37.2 C) (Oral)  Resp 18  Ht 6\' 1"  (1.854 m)  Wt 86.365 kg (190 lb 6.4 oz)  BMI 25.12 kg/m2  SpO2 97%  Physical Exam: Physical exam  Alert cooperative answering all questions appropriately, no apparent distress, pleasant  No oropharyngeal lesions, moist,  Lungs clear to auscultation bilaterally  Cardiovascular exam reveals distant heart sounds but regular rate and rhythm no murmur appreciated  Abdomen with PEG tube clean dry and intact, soft, nondistended, nontender  No  edema, pedal pulses intact, no cyanosis    Discharge Labs:  [facility] 09/07/11 0430 09/06/11 0340  NA 142 140  K 3.6 3.3*  CL 107 105  CO2 27 28  GLUCOSE 116* 116*  BUN 12 10  CREATININE 0.96 0.99  CALCIUM 9.6 9.7  MG -- --  PHOS -- --    Basename 09/07/11 0430 09/06/11 0340  AST 24 19  ALT 17 11  ALKPHOS 29* 31*  BILITOT 0.3 0.3  PROT 6.4 6.4  ALBUMIN 3.4* 3.4*    Basename 09/07/11 0430 09/06/11 0340  WBC 4.4 4.9  NEUTROABS -- --  HGB 11.4* 11.5*  HCT 32.4* 33.0*  MCV 99.4 98.5  PLT 120* 127*   No results found for this basename: CKTOTAL:3,CKMB:3,CKMBINDEX:3,TROPONINI:3 in the last 72 hours No results found for this basename: TSH,T4TOTAL,FREET3,T3FREE,THYROIDAB in the last 72 hours No results found for this basename: VITAMINB12:2,FOLATE:2,FERRITIN:2,TIBC:2,IRON:2,RETICCTPCT:2 in the last 72 hours  Discharge instructions: Discharge Orders    Future Appointments: Provider: Department: Dept Phone: Center:   09/12/2011 8:15 AM Verdie Mosher, CCC-SLP [facility] [phone] [facility]   10/01/2011 7:45 AM Gwenith Spitz Sh[place] [facility] [phone] None   10/01/2011 9:00 AM Zenovia Jarred, RD [facility] [phone] None   10/01/2011 10:00 AM [facility] [phone] [place]   10/02/2011 12:00 PM Jethro Bolus, MD [facility] [phone] None   10/09/2011 8:15 AM Verdie Mosher, CCC-SLP [facility] [phone] [facility]  11/05/2011 8:15 AM Verdie Mosher, CCC-SLP [facility] [phone] [facility]   11/10/2011 9:30 AM Billie Lade, MD [facility] [phone] None   12/02/2011 8:15 AM Verdie Mosher, CCC-SLP [facility] [phone] [facility]     Future Orders Please Complete By Expires   Diet - low sodium heart healthy      Increase activity slowly      Discharge instructions      Comments:   You'll be discharged on a combination of Coumadin, as well as subcutaneous Lovenox until your Coumadin dose is therapeutic, will need to be followed by either Dr.  Timothy Lasso or Dr. Gaylyn Rong, please call Dr. Timothy Lasso office in the morning for additional instructions   Call MD for:  temperature >100.4      Call MD for:  persistant nausea and vomiting      Call MD for:  severe uncontrolled pain         Disposition: Discharged home  Follow-up Appts: Follow-up with Dr. Timothy Lasso at [facility] in 1 week, will need PT/INR assessed on Tuesday or Wednesday of this coming week.  Call for appointment.  Condition on Discharge: Improved  Tests Needing Follow-up: PT/INR on Tuesday or Wednesday of this coming week  Signed: Foster Frericks R 09/07/2011, 4:30 PM

## 2011-09-07 NOTE — Progress Notes (Signed)
ANTICOAGULATION CONSULT NOTE - Follow Up Consult  Pharmacy Consult for Heparin/Warfarin  Indication: Mesenteric vein thrombosis  No Known Allergies  Patient Measurements: Height: 6\' 1"  (185.4 cm) Weight: 190 lb 6.4 oz (86.365 kg) IBW/kg (Calculated) : 79.9   Labs:  Basename 09/07/11 0430 09/06/11 0340 09/05/11 1220 09/05/11 0930 09/05/11 0325  HGB 11.4* 11.5* -- -- --  HCT 32.4* 33.0* -- -- 33.7*  PLT 120* 127* -- -- 123*  APTT -- -- -- -- --  LABPROT 20.1* 15.5* 14.5 -- --  INR 1.68* 1.20 1.11 -- --  HEPARINUNFRC 0.40 0.39 -- 0.52 --  CREATININE 0.96 0.99 -- -- 1.06  CKTOTAL -- -- -- -- --  CKMB -- -- -- -- --  TROPONINI -- -- -- -- --   Estimated Creatinine Clearance: 80.9 ml/min (by C-G formula based on Cr of 0.96).  Medications:  Scheduled:     . aspirin EC  81 mg Oral Daily  . neomycin-bacitracin-polymyxin      . pantoprazole  40 mg Oral Q0600  . potassium chloride  20 mEq Per Tube BID  . rosuvastatin  20 mg Oral q1800  . warfarin  6 mg Oral ONCE-1800   Infusions:     . dextrose 5 % and 0.9% NaCl 40 mL/hr at 09/07/11 0726  . feeding supplement (OSMOLITE 1.5 CAL) 1,000 mL (09/06/11 1353)  . heparin 800 Units/hr (09/06/11 2200)   Assessment:  70 yo male with mesenteric vein thrombosis.  Today is Day 3 of Coumadin/Heparin bridge  INR is trending up (1.68) and heparin level has been therapeutic on heparin 800 units/hr  RN reported that patient noticed small amount of blood in his urine 12/15 AM but no blood in the urinal upon RN's examination.  No mention of bleeding or complications since.  CSS stated no surgical role in these cases and recommended continuing anticoagulation.  Coumadin score = 7  CBC stable  Goal of Therapy:  Heparin level 0.3-0.7 units/ml INR 2-3    Plan:   Continue heparin at 800 units/hr  Repeat Coumadin 5 mg po x 1 tonight  F/u for coumadin education when appropriate  Geoffry Paradise Thi 09/07/2011,7:29 AM

## 2011-09-07 NOTE — Discharge Summary (Signed)
Discharge teaching completed to pt. And spouse. Coumadin Book from pharmacy given. Prescriptions given for coumadin, lovenox, ativan. Instructed spouse on how to give lovenox injections. Handouts given and reviewed on: Discharge summary, Abdominal pain, Venous Thromboembolisim, Enoxaparin, Home Use, Enoxaparin, Sodium solution for injection, Warfarin Sodium Oral tablet,  Prothrombin Time, International Normalized Ratio, Lorazepam Oral tablet. Answered questions. Pt given Coumadin po and lovenox injection per orders prior to discharge.

## 2011-09-07 NOTE — Progress Notes (Signed)
All discharge teaching completed. Spouse able to demonstrate lovenox injection without difficulty. Prescriptions given for ativan, coumadin, lovenox. Pt. Left via WC. No resp. Distress noted. See DC summary for additional teaching information.

## 2011-09-19 ENCOUNTER — Telehealth (INDEPENDENT_AMBULATORY_CARE_PROVIDER_SITE_OTHER): Payer: Self-pay | Admitting: *Deleted

## 2011-09-19 NOTE — Telephone Encounter (Signed)
Patient's wife called and said George Bradley has a peg tube that is inflamed. They have had the surgeon to look at it. Per George Bradley have the patient go to the ER. George Bradley said the peg tube is working correctly, it is just inflamed at the cite where it goes into the body. George Bradley spoke with the patient and advised her to use neosporin and to leave a message on George Bradley's voice mail. George Bradley said "George Bradley didn't put the tube in and this has been going on for 6 months".

## 2011-09-25 NOTE — Telephone Encounter (Signed)
I spoke with Dr. Karilyn Cota and he states that he would need to see the patient. An appointment has been given for the week of 09-29-11. Patient's wife, George Bradley , call and made aware.

## 2011-09-26 ENCOUNTER — Encounter: Payer: Self-pay | Admitting: *Deleted

## 2011-09-26 DIAGNOSIS — Z7901 Long term (current) use of anticoagulants: Secondary | ICD-10-CM | POA: Diagnosis not present

## 2011-09-26 DIAGNOSIS — I82409 Acute embolism and thrombosis of unspecified deep veins of unspecified lower extremity: Secondary | ICD-10-CM | POA: Diagnosis not present

## 2011-09-30 ENCOUNTER — Ambulatory Visit (INDEPENDENT_AMBULATORY_CARE_PROVIDER_SITE_OTHER): Payer: Medicare Other | Admitting: Internal Medicine

## 2011-09-30 ENCOUNTER — Encounter (INDEPENDENT_AMBULATORY_CARE_PROVIDER_SITE_OTHER): Payer: Self-pay | Admitting: Internal Medicine

## 2011-09-30 DIAGNOSIS — F32A Depression, unspecified: Secondary | ICD-10-CM

## 2011-09-30 DIAGNOSIS — R109 Unspecified abdominal pain: Secondary | ICD-10-CM

## 2011-09-30 DIAGNOSIS — F329 Major depressive disorder, single episode, unspecified: Secondary | ICD-10-CM

## 2011-09-30 MED ORDER — HYDROCODONE-ACETAMINOPHEN 5-500 MG PO TABS: 1.0000 | ORAL_TABLET | ORAL | Status: DC | PRN

## 2011-09-30 MED ORDER — ESCITALOPRAM OXALATE 10 MG PO TABS: 10.0000 mg | ORAL_TABLET | Freq: Every day | ORAL | Status: DC

## 2011-09-30 NOTE — Progress Notes (Signed)
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 He has undergone chemotherapy and radiation therapy and has done very well. He is scheduled to undergo a PET scan later this week. We initially saw him in August, 2012 To Pl. gastrostomy tube. G-tube could not be placed endoscopically as well as radiographically and he had surgical placement by Dr. Lodema Pilot.  Patient states that ever since he has had abdominal pain which is generalized but concentrated in left upper quadrant and around the G. site. He and his wife wonder if it is because his stomach was high riding and had to be pulled down. He gets some relief when he he is in bed and lying still. He takes pain medication at bedtime and it does seem to help but is afraid to take it during day times with the thought that he might become addicted. He denies nausea or vomiting but he has poor appetite and early satiety. He denies dysphagia. 3 weeks ago he developed worsening abdominal pain and was diagnosed with new synthetic vein thrombosis. He was treated with Lovenox followed by warfarin and he was discharged on 09/07/2011. He states his acute pain has resolved but not his chronic pain. His wife [name] reports that he had 0 residual mainly he was fed via G-tube and he was in the hospital. He denies chest pain fever chills night sweats melena or rectal bleeding. Current Medications: Current Outpatient Prescriptions  Medication Sig Dispense Refill  . aspirin EC 81 MG tablet Take 81 mg by mouth daily.        . Boost (BOOST) LIQD Take 237 mLs by mouth. 530 calories twice a day       . HYDROcodone-acetaminophen (LORTAB) 7.5-500 MG/15ML solution Take 15 mLs by mouth every 4 (four) hours as needed. For pain.      Marland Kitchen LORazepam (ATIVAN) 0.5 MG tablet Take 0.5 mg by mouth every  8 (eight) hours. As Needed       . Nutritional Supplements (FEEDING SUPPLEMENT, OSMOLITE 1.5 CAL,) LIQD Take 1,000 mLs by mouth. Administer through the night       . pantoprazole (PROTONIX) 40 MG tablet Take 1 tablet (40 mg total) by mouth daily.  30 tablet  5  . rosuvastatin (CRESTOR) 20 MG tablet Take 20 mg by mouth daily.        Marland Kitchen warfarin (COUMADIN) 5 MG tablet Take 1 tablet (5 mg total) by mouth one time only at 6 PM.  30 tablet  0  . zolpidem (AMBIEN) 10 MG tablet Take 5-10 mg by mouth at bedtime as needed. For sleep.        Objective: BP 110/72  Pulse 74  Temp(Src) 97.7 F (36.5 C) (Oral)  Resp 12  Ht 6\' 1"  (1.854 m)  Wt 189 lb (85.73 kg)  BMI 24.94 kg/m2  Conjunctiva is pink. Sclera is nonicteric Oral pharyngeal mucosa is normal. No neck masses or thyromegaly noted. Cardiac exam with regular rhythm normal S1 and S2. No murmur or gallop noted. Lungs are clear to auscultation. His abdomen is flat with gastrostomy tube located just below the medial aspect of left costal margin. There is some granulation tissue at gastrostomy site. On palpation is soft abdomen with generalized tenderness without guarding or rebound no organomegaly or masses noted. He does not have peripheral edema or clubbing.  Assessment: #1. Abdominal  pain. He has generalized abdominal pain since he had gastrostomy tube placed. It would be difficult to blame all of his pain and gastrostomy tube. Some of his pain may be due to neuropathy since he's undergone chemotherapy. His acute pain appears to have resolved since he was treated with anticoagulants for me synthetic vein thrombosis. Since this pain appears to be having a negative effect on quality of his life we should treat him with pain medication until gastrostomy tube can be removed. Once results of PET scan are available and it is negative will consider upper GI series to make sure he does not have axial rotation. #2. Early satiety and anorexia. According to  his wife he did not have large gastric residuals when he was fed via G-tube while  hospitalized at [facility]. I believe he is depressed.   Plan: #1. Lexapro 20 mg by mouth daily. #2. Discontinue liquid hydrocodone and start Vicodin 5 504 times a day or every 6 hours. #3. Patient advised he must increase physical activity such as walking or exercise he needs to increase oral intake. If he can get calorie intake close to 2000 G-tube could be removed. #4. Office visit in one month.

## 2011-09-30 NOTE — Patient Instructions (Signed)
New medications are Vicodin 5/500 and lexapro. Will schedule UGIS after PET scan done,

## 2011-10-01 ENCOUNTER — Other Ambulatory Visit: Payer: Self-pay | Admitting: Oncology

## 2011-10-01 ENCOUNTER — Encounter (HOSPITAL_COMMUNITY)
Admission: RE | Admit: 2011-10-01 | Discharge: 2011-10-01 | Disposition: A | Discharge status: Home / Self Care | Payer: Medicare Other | Source: Ambulatory Visit | Attending: Oncology | Admitting: Oncology

## 2011-10-01 ENCOUNTER — Other Ambulatory Visit (HOSPITAL_BASED_OUTPATIENT_CLINIC_OR_DEPARTMENT_OTHER): Payer: Medicare Other | Admitting: Lab

## 2011-10-01 ENCOUNTER — Ambulatory Visit: Payer: Medicare Other | Admitting: Nutrition

## 2011-10-01 DIAGNOSIS — C109 Malignant neoplasm of oropharynx, unspecified: Secondary | ICD-10-CM | POA: Insufficient documentation

## 2011-10-01 DIAGNOSIS — C029 Malignant neoplasm of tongue, unspecified: Secondary | ICD-10-CM

## 2011-10-01 DIAGNOSIS — C069 Malignant neoplasm of mouth, unspecified: Secondary | ICD-10-CM | POA: Diagnosis not present

## 2011-10-01 DIAGNOSIS — F329 Major depressive disorder, single episode, unspecified: Secondary | ICD-10-CM | POA: Insufficient documentation

## 2011-10-01 LAB — COMPREHENSIVE METABOLIC PANEL
ALT: 22 U/L (ref 0–53)
AST: 24 U/L (ref 0–37)
Albumin: 4.4 g/dL (ref 3.5–5.2)
BUN: 24 mg/dL — ABNORMAL HIGH (ref 6–23)
CO2: 24 mEq/L (ref 19–32)
Calcium: 9.6 mg/dL (ref 8.4–10.5)
Chloride: 102 mEq/L (ref 96–112)
Potassium: 4.6 mEq/L (ref 3.5–5.3)

## 2011-10-01 LAB — GLUCOSE, CAPILLARY: Glucose-Capillary: 124 mg/dL — ABNORMAL HIGH (ref 70–99)

## 2011-10-01 LAB — CBC WITH DIFFERENTIAL/PLATELET
BASO%: 0.4 % (ref 0.0–2.0)
Eosinophils Absolute: 0.1 10*3/uL (ref 0.0–0.5)
HCT: 38.6 % (ref 38.4–49.9)
MCHC: 34.3 g/dL (ref 32.0–36.0)
MONO#: 0.6 10*3/uL (ref 0.1–0.9)
NEUT#: 3 10*3/uL (ref 1.5–6.5)
NEUT%: 67 % (ref 39.0–75.0)
Platelets: 123 10*3/uL — ABNORMAL LOW (ref 140–400)
WBC: 4.5 10*3/uL (ref 4.0–10.3)
lymph#: 0.7 10*3/uL — ABNORMAL LOW (ref 0.9–3.3)

## 2011-10-01 LAB — TSH: TSH: 2.795 u[IU]/mL (ref 0.350–4.500)

## 2011-10-01 MED ORDER — FLUDEOXYGLUCOSE F - 18 (FDG) INJECTION
17.2000 | Freq: Once | INTRAVENOUS | Status: AC | PRN
  Administered 2011-10-01: 17.2 via INTRAVENOUS

## 2011-10-01 NOTE — Progress Notes (Signed)
INTERVAL HISTORY:  George Bradley and his wife present to nutrition followup. His weight was documented as 189 pounds, which is decreased from 194 pounds back in November.  The patient reports that he was admitted in December with a blood clot and he is extremely weak after that.  He is using his feeding tube overnight, providing about 1,000 calories via his feeding tube.  He is trying to drink some nutritional supplements and eat a few bites of food throughout the day.  He admits that he does not have trouble eating, in particular.  He does complain of stomach pain for which he has not been taking pain medication.  However, his GI doctor told him to be diligent about taking pain medication and now that he has taken it, he is able to eat.  The patient verbalizes a desire to have the feeding tube removed.  However, he admits that it is just easier to use his feeding tube than it is to force himself to eat.   NUTRITION DIAGNOSIS:  Inadequate oral intake continues.  INTERVENTION:  I have encouraged the patient to be diligent about taking pain medication as prescribed.  I have stressed the importance of him increasing his calories throughout the day so that when and if his feeding tube can be removed, he will be completely on an oral diet, maintaining his weight, and that there will be no question that he is ready for it to be taken out.  We have discussed the importance of trying new foods on a regular basis and to try to continue to eat small amounts frequently throughout the day.  The patient verbalizes understanding and agreement to try.  MONITORING, EVALUATION, AND GOALS:  The patient has been able to increase his oral intake.  However, he has lost 5 pounds. He will continue to work to increase his oral intake and decrease his tube feeding.  NEXT VISIT:  The patient's wife will call me after results of CAT scan and their GI visit has been completed and we will schedule a followup at that  time.    ______________________________ Zenovia Jarred, RD, LDN Clinical Nutrition Specialist BN/MEDQ  D:  10/01/2011  T:  10/01/2011  Job:  [id number]

## 2011-10-02 ENCOUNTER — Telehealth: Payer: Self-pay | Admitting: Oncology

## 2011-10-02 ENCOUNTER — Ambulatory Visit (HOSPITAL_BASED_OUTPATIENT_CLINIC_OR_DEPARTMENT_OTHER): Payer: Medicare Other | Admitting: Oncology

## 2011-10-02 DIAGNOSIS — K55069 Acute infarction of intestine, part and extent unspecified: Secondary | ICD-10-CM

## 2011-10-02 DIAGNOSIS — K55059 Acute (reversible) ischemia of intestine, part and extent unspecified: Secondary | ICD-10-CM

## 2011-10-02 DIAGNOSIS — I8290 Acute embolism and thrombosis of unspecified vein: Secondary | ICD-10-CM

## 2011-10-02 DIAGNOSIS — C109 Malignant neoplasm of oropharynx, unspecified: Secondary | ICD-10-CM | POA: Diagnosis not present

## 2011-10-02 DIAGNOSIS — I749 Embolism and thrombosis of unspecified artery: Secondary | ICD-10-CM

## 2011-10-02 DIAGNOSIS — R109 Unspecified abdominal pain: Secondary | ICD-10-CM

## 2011-10-02 DIAGNOSIS — E039 Hypothyroidism, unspecified: Secondary | ICD-10-CM

## 2011-10-02 NOTE — Telephone Encounter (Signed)
Appt for August lab and MD was changed per Dr. Gaylyn Rong. He will be on vacation from 2nd week of August until September 2013. Appt changed to end of August and September. Pt aware of change

## 2011-10-02 NOTE — Progress Notes (Signed)
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  PAST THERAPY:  weekly cisplatin/daily radiation between 04/2011 and 06/2011.  CURRENT THERAPY: watchful observation.   INTERVAL HISTORY: George Bradley 71 y.o. male returns for regular follow up with his wife.  He has had abdominal pain ever since the PEG tube was placed.  However, about 6-8 weeks ago, he developed significantly worsened abdominal pain.  He was evaluated by his GI physician who referred him to CT abdomen and CT angio which showed SMV thrombosis.  He has been on Coumadin per his PCP with INR 2-3.  There was no evidence of ascites, abscess.  The abdominal pain is crampy, worst 6/10, improved to 2/10 with Hydrocodone/APAP.  This pain had prevented him from increasing oral intake.  He thus has been taking Vicodin up to 4x/day to tolerate oral intake better.  He still takes in 3 cans Osmolyte/day via PEG.  He is able to drink 2 cans of high energy boost.  He has fatigue due to this abdominal pain, however, he is independent of personal hygiene activities. He has some mild neck swelling on the cervical area bilaterally however he cannot feel any bone lymph node or mass. He still has xerostomia and needs to drink water with his foods.  He denies dysphagia, odynophagia, nausea vomiting, hematemesis, hemoptysis, hematochezia, hematuria.  MEDICAL HISTORY: Past Medical History  Diagnosis Date  . Ischemic heart disease   . Aortic stenosis, mild   . Hyperlipidemia   . Insomnia   . Fatigue   . Dizziness   . Hypotension     orthostatic  . History of echocardiogram 02/09/2009    EF -  55-60%  . History of cardiovascular stress test 03/14/2009    EF - 57%   /  mild anterior hyperkinesia with discrete anterior scar consistent with previous infarction but unchanged from  previous studies.  Overall, stable findings -- Colleen Can. Deborah Chalk, MD  . Weakness   . ASCVD (arteriosclerotic cardiovascular disease)   . Arrhythmia     known history of  . Weight loss, abnormal 2012  . 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 Protein-calorie malnutrition, moderate 2012    on PEG tube feeding  . Cellulitis of artificial external opening, post-operative 2012    of PEG tube site    SURGICAL HISTORY:  Past Surgical History  Procedure Date  . 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 Cardiac catheterization 2007    EF - 55%  . Ablation of dysrhythmic focus   . Esophagogastroduodenoscopy 04/24/2011    Procedure: ESOPHAGOGASTRODUODENOSCOPY (EGD);  Surgeon: Malissa Hippo, MD;  Location: [facility];  Service: Endoscopy;  Laterality: N/A;  8:30 am  . Peg placement 04/24/2011    Procedure: PERCUTANEOUS ENDOSCOPIC GASTROSTOMY (PEG) PLACEMENT;  Surgeon: Malissa Hippo, MD;  Location: [facility];  Service: Endoscopy;  Laterality: N/A;    MEDICATIONS: Current Outpatient Prescriptions  Medication Sig Dispense Refill  . aspirin EC 81 MG tablet Take 81 mg by mouth daily.        Marland Kitchen escitalopram (LEXAPRO) 10 MG tablet Take 1 tablet (10 mg total) by mouth daily.  30 tablet  5  . HYDROcodone-acetaminophen (VICODIN) 5-500 MG per tablet Take 1 tablet by mouth every 4 (four) hours as needed for pain.  120  tablet  0  . LORazepam (ATIVAN) 0.5 MG tablet Take 0.5 mg by mouth every 8 (eight) hours. As Needed       . Nutritional Supplements (FEEDING SUPPLEMENT, OSMOLITE 1.5 CAL,) LIQD Take 1,000 mLs by mouth. Administer through the night       . pantoprazole (PROTONIX) 40 MG tablet Take 1 tablet (40 mg total) by mouth daily.  30 tablet  5  . rosuvastatin (CRESTOR) 20 MG tablet Take 20 mg by mouth daily.        Marland Kitchen warfarin (COUMADIN) 5 MG tablet Take 1 tablet (5 mg total) by mouth one time only at 6 PM.  30 tablet  0  . zolpidem (AMBIEN) 10 MG tablet Take 5-10 mg by mouth at bedtime as needed.  For sleep.        ALLERGIES:   has no known allergies.  REVIEW OF SYSTEMS:  The rest of the 14-point review of system was negative.   Filed Vitals:   10/02/11 1205  BP: 124/72  Pulse: 71  Temp: 97.8 F (36.6 C)   Wt Readings from Last 3 Encounters:  10/02/11 187 lb 12.8 oz (85.186 kg)  09/30/11 189 lb (85.73 kg)  07/25/11 193 lb (87.544 kg)   ECOG Performance status: 1-2  PHYSICAL EXAMINATION:   General:  well-nourished in no acute distress.  Eyes:  no scleral icterus.  ENT:  There were no oropharyngeal lesions.  Neck was without thyromegaly.  Lymphatics:  Negative cervical, supraclavicular or axillary adenopathy.  Respiratory: lungs were clear bilaterally without wheezing or crackles.  Cardiovascular:  Regular rate and rhythm, S1/S2, without murmur, rub or gallop.  There was no pedal edema.  GI:  abdomen was soft, flat, nontender, nondistended, without organomegaly.  PEG tube in place without erythema, purulent discharge.  Muscoloskeletal:  no spinal tenderness of palpation of vertebral spine.  Skin exam was without echymosis, petichae.  Neuro exam was nonfocal.  Patient was able to get on and off exam table without assistance.  Gait was normal.  Patient was alerted and oriented.  Attention was good.   Language was appropriate.  Mood was normal without depression.  Speech was not pressured.  Thought content was not tangential.         LABORATORY/RADIOLOGY DATA:  Lab Results  Component Value Date   WBC 4.5 10/01/2011   HGB 13.3 10/01/2011   HCT 38.6 10/01/2011   PLT 123* 10/01/2011   GLUCOSE 113* 10/01/2011   CHOL 161 02/20/2011   TRIG 101.0 02/20/2011   HDL 51.60 02/20/2011   LDLCALC 89 02/20/2011   ALT 22 10/01/2011   AST 24 10/01/2011   NA 139 10/01/2011   K 4.6 10/01/2011   CL 102 10/01/2011   CREATININE 1.14 10/01/2011   BUN 24* 10/01/2011   CO2 24 10/01/2011   TSH 2.795 10/01/2011   INR 1.68* 09/07/2011    Ct Angio Abdomen W/cm &/or Wo Contrast  09/03/2011  *RADIOLOGY REPORT*  Clinical  Data:  Vascular pathology right lower abdomen.  Assess for arterial versus venous.  Abdominal pain.  CT ANGIOGRAPHY ABDOMEN  Technique:  Multidetector CT imaging of the abdomen was performed using the standard protocol during bolus administration of intravenous contrast.  Multiplanar reconstructed images including MIPs were obtained and reviewed to evaluate the vascular anatomy.  Contrast: 80mL OMNIPAQUE IOHEXOL 350 MG/ML IV SOLN  Comparison:  Same day  Findings:  The examination shows conclusively that the abnormal vessel represents a branch of the superior mesenteric vein rather than  the superior mesenteric artery.  Being an arterial phase exam, I cannot assess with certainty the extent of the venous thrombosis but I do not that it involves the main superior mesenteric vein but only the branch vessel.  The aorta shows atherosclerotic change but no aneurysm.  Celiac origin is widely patent.  There is atherosclerotic plaque at the superior mesenteric artery origin but I do not think there is a flow-limiting stenosis.  I cannot identify a patent inferior mesenteric artery.  There is atherosclerotic plaque at both renal artery origins.  On the left, there could possibly be a flow limiting stenosis.  I would estimate this at 50-70%.  On the right, there could also be a flow limiting stenosis estimated at 50%.  The only other parenchymal finding that was not seen or appreciated on the initial scan is the presence of some small mesenteric lymph nodes, none larger than 4 mm in size.  These appear normally distributed and I think are normal structures.  Review of the MIP images confirms the above findings.  IMPRESSION: Abdominal CT angiography demonstrates that the abnormal vessel extending in the right lower quadrant mesentery is a vein rather than the artery.  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  Bilateral renal artery stenoses estimated at 50-70%.  Original Report Authenticated By:  Thomasenia Sales, M.D.   Ct Abdomen Pelvis W Contrast  09/03/2011  *RADIOLOGY REPORT*  Clinical Data: Abdominal pain.  CT ABDOMEN AND PELVIS WITH CONTRAST  Technique:  Multidetector CT imaging of the abdomen and pelvis was performed following the standard protocol during bolus administration of intravenous contrast.  Contrast: OMNIPAQUE IOHEXOL 300 MG/ML IV SOLN  Comparison: None.  Findings: Lung bases are clear.  No pleural or pericardial fluid.  The liver appears normal.  No calcified gallstones.  The spleen is normal.  The pancreas is normal.  The adrenal glands are normal. Both kidneys are normal.  The aorta shows atherosclerotic change but no aneurysm.  The IVC is normal.  No retroperitoneal mass or adenopathy.  No free intraperitoneal fluid or air.  Peg tube has good appearance.  There is some stranding around branches of the mesenteric vessels extending towards the right lower quadrant. I think this could represent branch vessel superior mesenteric vein thrombosis or embolus or thrombosis of a superior mesenteric arterial branch.  There is no portal vein thrombosis.  No primary bowel pathology is discernible.  There is a normal appearing appendix.  No free fluid in the pelvis.  Bladder appears normal. Ordinary degenerative changes present in the lower lumbar spine.  IMPRESSION: Abnormal mesenteric vessels extending towards the right lower quadrant.  I think the differential diagnosis is branch vessels appear mesenteric vein thrombosis versus branch vessel arterial thrombosis or embolus.  I think it is difficult to be absolutely certain which we are dealing with.  Certainly there is no evidence of bowel infarction or edema.  Original Report Authenticated By: Thomasenia Sales, M.D.   Nm Pet Image Restag (ps) Skull Base To Thigh:  I personally reviewed the images of the PET scan and showed the pictures the patient and his wife.  In brief there was no evidence of recurrent or metastases  disease.  10/01/2011  *RADIOLOGY REPORT*  Clinical Data: Subsequent treatment strategy for oral pharyngeal malignancy.  NUCLEAR MEDICINE PET CT RESTAGING (PS) SKULL BASE TO THIGH  Technique:  17.2 mCi F-18 FDG was injected intravenously via the right antecubital fossa.  Full-ring PET imaging was performed  from the skull base through the mid-thighs 69  minutes after injection. CT data was obtained and used for attenuation correction and anatomic localization only.  (This was not acquired as a diagnostic CT examination.)  Fasting Blood Glucose:  124  Patient Weight:  183 pounds.  Comparison: CT abdomen pelvis 09/03/2011 and PET CT 04/07/2011. Neck CT 03/20/2011.  Findings: Patchy uptake in the neck appears predominantly muscular and therefore physiologic.  No discrete hypermetabolic adenopathy or mass. For example, a 1.1 cm long axis right level II lymph node (neck CT image 37) does not show hypermetabolism.  No areas of abnormal hypermetabolism in the chest, abdomen or pelvis.  CT images were performed for attenuation correction. No acute findings in the neck.  Atherosclerotic calcification of the arterial vasculature.  No acute findings in the chest, abdomen or pelvis.  IMPRESSION: No abnormal hypermetabolism in the neck to suggest residual or recurrent disease.  Original Report Authenticated By: Reyes Ivan, M.D.    ASSESSMENT AND PLAN:  1. History of Oropharynx squamous cell carcinoma:  I discussed with Mr. Truex and his wife that there is no evidence of recurrent or metastases disease. I recommend observation. I advised him to follow with ENT and radiation oncology in between visits with me every 6 months here at the cancer Center.   Given his history of HPV negative, smoking positive stage IV oropharyngeal squamous cell cancer, he is at slight elevated risk of recurrence. I therefore recommended CT of the neck in about 6 months.  He no longer chews tobacco. He has never had history of smoking  cigarettes or use alcohol. 2. Slight anemia and thrombocytopenia second to chemo:  Improved.  3. Xerostomia:  Normal side effects.  I advised him to adherent to oral care with biotin, artificial saliva, mouth rinses. 4. Mild to moderate calorie protein malnutrition:  I discussed with him that if he can increase the oral intake with 2 or 3 cans of high care boost a day and eating regular food he may be able to get the PEG tube out in the near future. 5. Abdominal pain: Unclear etiology. His GI physician raised the possibly of the PEG tube causing this problem.  I encouraged patient take more oral and minimize the use of PEG tube to the caliber but as soon as possible.  For now he'll continue the use of Lortab to allow him to eat more. 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 He does not have any history of DVT or PE before in the past. I recommended 6 months of Coumadin until we repeat abdominal imaging and to visualize whether the thrombosis is still present or not. If the thrombosis results of that time I will bring him back one month later for thrombophilia workup. If he still has thrombosis on the CT scan in 6 months, then  I would extend durations of his Coumadin to 1 year total.  If his thrombosis is resolved at that time, then thrombophilia workup will allow [place] to know whether he would need long-term anticoagulant not. 7. Follow up:  CT neck/abdomen in 6 months.  If he goes of Coumadin then, he will need about 1-2 off before thrombophelia work up.  I will see him 1-2 wks after thrombophelia work up.    The length of time of the face-to-face encounter was 30 minutes. More than 50% of time was spent counseling and coordination of care.

## 2011-10-06 ENCOUNTER — Other Ambulatory Visit: Payer: Self-pay | Admitting: Oncology

## 2011-10-06 ENCOUNTER — Other Ambulatory Visit: Payer: Self-pay | Admitting: *Deleted

## 2011-10-06 ENCOUNTER — Telehealth: Payer: Self-pay | Admitting: *Deleted

## 2011-10-06 DIAGNOSIS — C109 Malignant neoplasm of oropharynx, unspecified: Secondary | ICD-10-CM

## 2011-10-06 NOTE — Telephone Encounter (Signed)
VM from wife this states pt wants to have his porta cath removed. Per Dr. Gaylyn Rong,  [facility] to order [facility] removal by IR.Marland Kitchen   Order placed by this RN.  Called pt back and informed him of removal ordered and to expect call from [facility] to schedule the removal date and time.  He verbalized understanding.

## 2011-10-07 ENCOUNTER — Other Ambulatory Visit: Payer: Self-pay | Admitting: *Deleted

## 2011-10-07 ENCOUNTER — Encounter (INDEPENDENT_AMBULATORY_CARE_PROVIDER_SITE_OTHER): Payer: Self-pay | Admitting: *Deleted

## 2011-10-07 ENCOUNTER — Other Ambulatory Visit (INDEPENDENT_AMBULATORY_CARE_PROVIDER_SITE_OTHER): Payer: Self-pay | Admitting: Internal Medicine

## 2011-10-07 ENCOUNTER — Telehealth: Payer: Self-pay | Admitting: *Deleted

## 2011-10-07 NOTE — Progress Notes (Signed)
This encounter was created in error - please disregard.

## 2011-10-07 NOTE — Telephone Encounter (Signed)
Call from [place] in [facility]  She called Dr. Timothy Lasso to ask about holding pt's coumadin for 7 days prior to [facility] removal.  She says Dr. Timothy Lasso does not want pt to stop coumadin for at least 6 more months. Notified Dr. Gaylyn Rong and called pt's wife.  Informed her of above and PAC removal canceled for now.  Suggested pt can ask Dr. Timothy Lasso if bridging w/ lovenox is an option to be able to have PAC removed.  Otherwise pt will keep it in until ok to hold coumadin.  Instructed wife that pt will require PAC flushes every 6 to 8 weeks while he has it and I will have scheduling call them for those appts.  She verbalized understanding.  POF sent to scheduling.

## 2011-10-09 ENCOUNTER — Encounter (HOSPITAL_COMMUNITY): Payer: Self-pay | Admitting: *Deleted

## 2011-10-09 ENCOUNTER — Emergency Department (HOSPITAL_COMMUNITY)
Admission: EM | Admit: 2011-10-09 | Discharge: 2011-10-09 | Disposition: A | Discharge status: Home / Self Care | Payer: Medicare Other | Attending: Emergency Medicine | Admitting: Emergency Medicine

## 2011-10-09 ENCOUNTER — Emergency Department (HOSPITAL_COMMUNITY): Payer: Medicare Other

## 2011-10-09 DIAGNOSIS — Z931 Gastrostomy status: Secondary | ICD-10-CM | POA: Diagnosis not present

## 2011-10-09 DIAGNOSIS — Z7982 Long term (current) use of aspirin: Secondary | ICD-10-CM | POA: Insufficient documentation

## 2011-10-09 DIAGNOSIS — Z85810 Personal history of malignant neoplasm of tongue: Secondary | ICD-10-CM | POA: Insufficient documentation

## 2011-10-09 DIAGNOSIS — I359 Nonrheumatic aortic valve disorder, unspecified: Secondary | ICD-10-CM | POA: Insufficient documentation

## 2011-10-09 DIAGNOSIS — Z79899 Other long term (current) drug therapy: Secondary | ICD-10-CM | POA: Insufficient documentation

## 2011-10-09 DIAGNOSIS — K7689 Other specified diseases of liver: Secondary | ICD-10-CM | POA: Diagnosis not present

## 2011-10-09 DIAGNOSIS — Z951 Presence of aortocoronary bypass graft: Secondary | ICD-10-CM | POA: Insufficient documentation

## 2011-10-09 DIAGNOSIS — I2510 Atherosclerotic heart disease of native coronary artery without angina pectoris: Secondary | ICD-10-CM | POA: Diagnosis not present

## 2011-10-09 DIAGNOSIS — G4700 Insomnia, unspecified: Secondary | ICD-10-CM | POA: Insufficient documentation

## 2011-10-09 DIAGNOSIS — R109 Unspecified abdominal pain: Secondary | ICD-10-CM | POA: Diagnosis not present

## 2011-10-09 DIAGNOSIS — Q5522 Retractile testis: Secondary | ICD-10-CM | POA: Diagnosis not present

## 2011-10-09 LAB — COMPREHENSIVE METABOLIC PANEL
ALT: 44 U/L (ref 0–53)
AST: 30 U/L (ref 0–37)
Albumin: 4.2 g/dL (ref 3.5–5.2)
Calcium: 10.5 mg/dL (ref 8.4–10.5)
Sodium: 139 mEq/L (ref 135–145)
Total Protein: 7.6 g/dL (ref 6.0–8.3)

## 2011-10-09 LAB — URINALYSIS, ROUTINE W REFLEX MICROSCOPIC
Glucose, UA: NEGATIVE mg/dL
Hgb urine dipstick: NEGATIVE
Specific Gravity, Urine: 1.019 (ref 1.005–1.030)
Urobilinogen, UA: 0.2 mg/dL (ref 0.0–1.0)
pH: 6.5 (ref 5.0–8.0)

## 2011-10-09 LAB — CBC
MCH: 33.7 pg (ref 26.0–34.0)
MCHC: 34.8 g/dL (ref 30.0–36.0)
Platelets: 163 10*3/uL (ref 150–400)

## 2011-10-09 LAB — PROTIME-INR
INR: 2.54 — ABNORMAL HIGH (ref 0.00–1.49)
Prothrombin Time: 27.8 seconds — ABNORMAL HIGH (ref 11.6–15.2)

## 2011-10-09 MED ORDER — IOHEXOL 300 MG/ML  SOLN
100.0000 mL | Freq: Once | INTRAMUSCULAR | Status: AC | PRN
  Administered 2011-10-09: 100 mL via INTRAVENOUS

## 2011-10-09 MED ORDER — LORAZEPAM 2 MG/ML IJ SOLN
0.5000 mg | Freq: Once | INTRAMUSCULAR | Status: AC
  Administered 2011-10-09: 0.5 mg via INTRAVENOUS
  Filled 2011-10-09: qty 1

## 2011-10-09 MED ORDER — HYDROMORPHONE HCL PF 1 MG/ML IJ SOLN: 0.5000 mg | Freq: Once | INTRAMUSCULAR | Status: DC

## 2011-10-09 MED ORDER — ONDANSETRON HCL 4 MG/2ML IJ SOLN: 4.0000 mg | Freq: Once | INTRAMUSCULAR | Status: DC

## 2011-10-09 NOTE — ED Notes (Signed)
Pt returned from CT °

## 2011-10-09 NOTE — ED Notes (Signed)
Patient transported to CT 

## 2011-10-09 NOTE — ED Notes (Signed)
MD at bedside. 

## 2011-10-09 NOTE — ED Notes (Signed)
Family at bedside. 

## 2011-10-09 NOTE — ED Provider Notes (Addendum)
History     CSN: [id number]  Arrival date & time 10/09/11  1336   First MD Initiated Contact with Patient 10/09/11 1353      Chief Complaint  Patient presents with  . Abdominal Pain    (Consider location/radiation/quality/duration/timing/severity/associated sxs/prior treatment) Patient is a 71 y.o. male presenting with abdominal pain. The history is provided by the patient and the spouse.  Abdominal Pain The primary symptoms of the illness include abdominal pain. The primary symptoms of the illness do not include fever, shortness of breath, vomiting, diarrhea or dysuria.  Symptoms associated with the illness do not include chills or back pain.  pt states had peg placed 8/12 as had 'cancer of the tongue'. Had radiation and chemo, states recent pet scans have shown cancer gone. Pt states almost immediately after peg tube place had problems with inability to tolerate large or bolus feedings of food. Spouse notes feeding intolerance became worse a month ago after hospitalized with a mesenteric vein thrombosis. Symptoms have continued to worsen in past week with inability to tolerate any significant amount of feeding either oral or per tube. States can get in approximately 1000 calaries per day per nighttime infusion via peg. Unspecified wt loss. Did have acute lower abd pain last pm. Crampy, bil lower quadrant. No radiation. That pain is currently improved. Mild constipation, takes laxatives prn, last normal bm 2-3 days ago. No emesis. No fever or chills. No gu c/o.   Past Medical History  Diagnosis Date  . Ischemic heart disease   . Aortic stenosis, mild   . Hyperlipidemia   . Insomnia   . Fatigue   . Dizziness   . Hypotension     orthostatic  . History of echocardiogram 02/09/2009    EF -  55-60%  . History of cardiovascular stress test 03/14/2009    EF - 57%   /  mild anterior hyperkinesia with discrete anterior scar consistent with previous infarction but unchanged from previous  studies.  Overall, stable findings -- Colleen Can. Deborah Chalk, MD  . Weakness   . ASCVD (arteriosclerotic cardiovascular disease)   . Arrhythmia     known history of  . Weight loss, abnormal 2012  . 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 Protein-calorie malnutrition, moderate 2012    on PEG tube feeding  . Cellulitis of artificial external opening, post-operative 2012    of PEG tube site    Past Surgical History  Procedure Date  . 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 Cardiac catheterization 2007    EF - 55%  . Ablation of dysrhythmic focus   . Esophagogastroduodenoscopy 04/24/2011    Procedure: ESOPHAGOGASTRODUODENOSCOPY (EGD);  Surgeon: Malissa Hippo, MD;  Location: [facility];  Service: Endoscopy;  Laterality: N/A;  8:30 am  . Peg placement 04/24/2011    Procedure: PERCUTANEOUS ENDOSCOPIC GASTROSTOMY (PEG) PLACEMENT;  Surgeon: Malissa Hippo, MD;  Location: [facility];  Service: Endoscopy;  Laterality: N/A;    Family History  Problem Relation Age of Onset  . Heart disease Father   . Heart disease Brother   . Heart disease Sister   . Cancer Sister     breast cancer  . Cancer Mother     breast ca  . Hyperlipidemia Son     History  Substance Use Topics  . Smoking status: Former Smoker    Types: Cigars  . Smokeless tobacco: Former Neurosurgeon    Quit date: 05/06/2011   Comment: 2 cigars  a week  . Alcohol Use: No     occasionally glass of wine      Review of Systems  Constitutional: Negative for fever and chills.  HENT: Negative for neck pain.   Eyes: Negative for redness.  Respiratory: Negative for cough and shortness of breath.   Cardiovascular: Negative for chest pain and leg swelling.  Gastrointestinal: Positive for abdominal pain. Negative for vomiting, diarrhea and abdominal distention.  Genitourinary: Negative for dysuria and flank pain.  Musculoskeletal: Negative for back pain.  Skin: Negative for rash.  Neurological: Negative for headaches.    Hematological: Does not bruise/bleed easily.  Psychiatric/Behavioral: Negative for confusion.    Allergies  Review of patient's allergies indicates no known allergies.  Home Medications   Current Outpatient Rx  Name Route Sig Dispense Refill  . ASPIRIN EC 81 MG PO TBEC Oral Take 81 mg by mouth daily.      Marland Kitchen ESCITALOPRAM OXALATE 10 MG PO TABS Oral Take 1 tablet (10 mg total) by mouth daily. 30 tablet 5  . HYDROCODONE-ACETAMINOPHEN 5-500 MG PO TABS Oral Take 1 tablet by mouth every 4 (four) hours as needed for pain. 120 tablet 0  . LORAZEPAM 0.5 MG PO TABS Oral Take 0.5 mg by mouth every 8 (eight) hours. As Needed     . OSMOLITE 1.5 CAL PO LIQD Oral Take 1,000 mLs by mouth. Administer through the night     . PANTOPRAZOLE SODIUM 40 MG PO TBEC Oral Take 1 tablet (40 mg total) by mouth daily. 30 tablet 5  . ROSUVASTATIN CALCIUM 20 MG PO TABS Oral Take 20 mg by mouth daily.      . WARFARIN SODIUM 5 MG PO TABS Oral Take 1 tablet (5 mg total) by mouth one time only at 6 PM. 30 tablet 0  . ZOLPIDEM TARTRATE 10 MG PO TABS Oral Take 5-10 mg by mouth at bedtime as needed. For sleep.      BP 147/81  Pulse 80  Temp(Src) 98.6 F (37 C) (Oral)  Resp 16  Wt 178 lb (80.74 kg)  SpO2 98%  Physical Exam  Nursing note and vitals reviewed. Constitutional: He is oriented to person, place, and time. He appears well-developed and well-nourished. No distress.  HENT:  Head: Atraumatic.  Eyes: Pupils are equal, round, and reactive to light.  Neck: Neck supple. No tracheal deviation present.  Cardiovascular: Normal rate, regular rhythm, normal heart sounds and intact distal pulses.   Pulmonary/Chest: Effort normal and breath sounds normal. No accessory muscle usage. No respiratory distress.  Abdominal: Soft. Bowel sounds are normal. He exhibits no distension and no mass. There is no tenderness. There is no rebound and no guarding.       Peg tube upper abd, intact, site without sign of infection.   Genitourinary:       No cva tenderness. Rectal light Dukeman stool, soft, heme neg.   Musculoskeletal: Normal range of motion.  Neurological: He is alert and oriented to person, place, and time.  Skin: Skin is warm and dry.  Psychiatric: He has a normal mood and affect.    ED Course  Procedures (including critical care time)   Labs Reviewed  CBC  COMPREHENSIVE METABOLIC PANEL  LIPASE, BLOOD  URINALYSIS, ROUTINE W REFLEX MICROSCOPIC   Results for orders placed during the hospital encounter of 10/09/11  CBC      Component Value Range   WBC 4.7  4.0 - 10.5 (K/uL)   RBC 4.13 (*) 4.22 - 5.81 (  MIL/uL)   Hemoglobin 13.9  13.0 - 17.0 (g/dL)   HCT 16.1  09.6 - 04.5 (%)   MCV 96.6  78.0 - 100.0 (fL)   MCH 33.7  26.0 - 34.0 (pg)   MCHC 34.8  30.0 - 36.0 (g/dL)   RDW 40.9  81.1 - 91.4 (%)   Platelets 163  150 - 400 (K/uL)  COMPREHENSIVE METABOLIC PANEL      Component Value Range   Sodium 139  135 - 145 (mEq/L)   Potassium 3.7  3.5 - 5.1 (mEq/L)   Chloride 102  96 - 112 (mEq/L)   CO2 23  19 - 32 (mEq/L)   Glucose, Bld 106 (*) 70 - 99 (mg/dL)   BUN 17  6 - 23 (mg/dL)   Creatinine, Ser 7.82  0.50 - 1.35 (mg/dL)   Calcium 95.6  8.4 - 10.5 (mg/dL)   Total Protein 7.6  6.0 - 8.3 (g/dL)   Albumin 4.2  3.5 - 5.2 (g/dL)   AST 30  0 - 37 (U/L)   ALT 44  0 - 53 (U/L)   Alkaline Phosphatase 35 (*) 39 - 117 (U/L)   Total Bilirubin 0.3  0.3 - 1.2 (mg/dL)   GFR calc non Af Amer 71 (*) >90 (mL/min)   GFR calc Af Amer 82 (*) >90 (mL/min)  LIPASE, BLOOD      Component Value Range   Lipase 17  11 - 59 (U/L)  PROTIME-INR      Component Value Range   Prothrombin Time 27.8 (*) 11.6 - 15.2 (seconds)   INR 2.54 (*) 0.00 - 1.49    Nm Pet Image Restag (ps) Skull Base To Thigh  10/01/2011  *RADIOLOGY REPORT*  Clinical Data: Subsequent treatment strategy for oral pharyngeal malignancy.  NUCLEAR MEDICINE PET CT RESTAGING (PS) SKULL BASE TO THIGH  Technique:  17.2 mCi F-18 FDG was injected  intravenously via the right antecubital fossa.  Full-ring PET imaging was performed from the skull base through the mid-thighs 69  minutes after injection. CT data was obtained and used for attenuation correction and anatomic localization only.  (This was not acquired as a diagnostic CT examination.)  Fasting Blood Glucose:  124  Patient Weight:  183 pounds.  Comparison: CT abdomen pelvis 09/03/2011 and PET CT 04/07/2011. Neck CT 03/20/2011.  Findings: Patchy uptake in the neck appears predominantly muscular and therefore physiologic.  No discrete hypermetabolic adenopathy or mass. For example, a 1.1 cm long axis right level II lymph node (neck CT image 37) does not show hypermetabolism.  No areas of abnormal hypermetabolism in the chest, abdomen or pelvis.  CT images were performed for attenuation correction. No acute findings in the neck.  Atherosclerotic calcification of the arterial vasculature.  No acute findings in the chest, abdomen or pelvis.  IMPRESSION: No abnormal hypermetabolism in the neck to suggest residual or recurrent disease.  Original Report Authenticated By: Reyes Ivan, M.D.       MDM  Iv ns bolus. zofran iv. Labs.   Reviewed nursing notes and prior charts.   Pt on ativan at home, request med to help relax. Ativan iv.   Recheck pt feels improved. Ct pending. Signed out to Dr Rubin Payor to check CT when back and dispo appropriately.   Called 1/18 by radiology re amended ct report from yesterday, w small lv anuerysm - called pcp/russo, relayed results.       Suzi Roots, MD 10/09/11 2130  Suzi Roots, MD 10/10/11 1055

## 2011-10-09 NOTE — ED Notes (Signed)
Spouse states "this is an ongoing problem, he has a peg, but can't put anything in his stomach, this has been going since 05/07/2011"

## 2011-10-09 NOTE — ED Notes (Signed)
Patient is resting comfortably. 

## 2011-10-09 NOTE — ED Provider Notes (Signed)
  Physical Exam  BP 147/81  Pulse 80  Temp(Src) 98.6 F (37 C) (Oral)  Resp 16  Wt 178 lb (80.74 kg)  SpO2 98%  Physical Exam  ED Course  Procedures  MDM Abdominal pain. Labs and CT are reassuring. Urinalysis does not show infection. He'll be discharged home to follow with GI in his primary care Dr.      Juliet Rude. Rubin Payor, MD 10/09/11 1731

## 2011-10-09 NOTE — ED Notes (Signed)
Vital signs stable. 

## 2011-10-10 ENCOUNTER — Other Ambulatory Visit (HOSPITAL_COMMUNITY): Payer: Self-pay

## 2011-10-10 ENCOUNTER — Telehealth: Payer: Self-pay | Admitting: Oncology

## 2011-10-10 ENCOUNTER — Ambulatory Visit (HOSPITAL_COMMUNITY)
Admission: RE | Admit: 2011-10-10 | Discharge: 2011-10-10 | Disposition: A | Discharge status: Home / Self Care | Payer: Medicare Other | Source: Ambulatory Visit | Attending: Internal Medicine | Admitting: Internal Medicine

## 2011-10-10 ENCOUNTER — Other Ambulatory Visit (INDEPENDENT_AMBULATORY_CARE_PROVIDER_SITE_OTHER): Payer: Self-pay | Admitting: Internal Medicine

## 2011-10-10 DIAGNOSIS — K219 Gastro-esophageal reflux disease without esophagitis: Secondary | ICD-10-CM | POA: Diagnosis not present

## 2011-10-10 DIAGNOSIS — C029 Malignant neoplasm of tongue, unspecified: Secondary | ICD-10-CM | POA: Insufficient documentation

## 2011-10-10 DIAGNOSIS — R6881 Early satiety: Secondary | ICD-10-CM | POA: Diagnosis not present

## 2011-10-10 DIAGNOSIS — Z931 Gastrostomy status: Secondary | ICD-10-CM | POA: Insufficient documentation

## 2011-10-10 DIAGNOSIS — Z9221 Personal history of antineoplastic chemotherapy: Secondary | ICD-10-CM | POA: Diagnosis not present

## 2011-10-10 DIAGNOSIS — R109 Unspecified abdominal pain: Secondary | ICD-10-CM | POA: Diagnosis not present

## 2011-10-10 DIAGNOSIS — R1013 Epigastric pain: Secondary | ICD-10-CM | POA: Diagnosis not present

## 2011-10-10 DIAGNOSIS — Z923 Personal history of irradiation: Secondary | ICD-10-CM | POA: Diagnosis not present

## 2011-10-10 NOTE — Telephone Encounter (Signed)
pts wife rtn call [facility] that they received message and they wiil pick up schedule on 2-18

## 2011-10-10 NOTE — Telephone Encounter (Signed)
called pt [facility] that I scheduled flush appts for [id number] and to pick up schedule on his visit for 02/18

## 2011-10-13 ENCOUNTER — Telehealth (INDEPENDENT_AMBULATORY_CARE_PROVIDER_SITE_OTHER): Payer: Self-pay | Admitting: *Deleted

## 2011-10-13 NOTE — Telephone Encounter (Signed)
Per Reba, 4:00 pm will work for date of service 10/16/11.

## 2011-10-13 NOTE — Telephone Encounter (Signed)
Per Dr. Karilyn Cota bring the patient into see him on Thursday ,January 24,2013. He also states that if the patient is eating enough by mouth he should have tube removed. I will call and make him aware about the tube.

## 2011-10-13 NOTE — Telephone Encounter (Signed)
Patient called and advised that his appointment is January 24 th at 4 pm. The patient states that he has not lost anymore weight, when I ask if he was able to eat by mouth and tolerate it well.

## 2011-10-13 NOTE — Telephone Encounter (Signed)
Per Dr. Karilyn Cota if the patient is eating enough by mouth he should get it out. Also , per Dr. Karilyn Cota make that patient an appointment with him for this Thursday 10-16-11. I will call the patient to address the tube.

## 2011-10-13 NOTE — Telephone Encounter (Signed)
Would like to know if he should have the tube removed? Patient has been scheduled an appt with Terri on Monday 10/20/11 at 2:15 pm.

## 2011-10-16 ENCOUNTER — Encounter (INDEPENDENT_AMBULATORY_CARE_PROVIDER_SITE_OTHER): Payer: Self-pay | Admitting: Internal Medicine

## 2011-10-16 ENCOUNTER — Ambulatory Visit (INDEPENDENT_AMBULATORY_CARE_PROVIDER_SITE_OTHER): Payer: Medicare Other | Admitting: Internal Medicine

## 2011-10-16 DIAGNOSIS — K219 Gastro-esophageal reflux disease without esophagitis: Secondary | ICD-10-CM

## 2011-10-16 DIAGNOSIS — Z733 Stress, not elsewhere classified: Secondary | ICD-10-CM

## 2011-10-16 DIAGNOSIS — F439 Reaction to severe stress, unspecified: Secondary | ICD-10-CM

## 2011-10-16 MED ORDER — PANTOPRAZOLE SODIUM 40 MG PO TBEC: 40.0000 mg | DELAYED_RELEASE_TABLET | Freq: Two times a day (BID) | ORAL | Status: DC

## 2011-10-16 MED ORDER — LORAZEPAM 1 MG PO TABS: 1.0000 mg | ORAL_TABLET | Freq: Three times a day (TID) | ORAL | Status: DC | PRN

## 2011-10-16 NOTE — Patient Instructions (Signed)
Take Lorazepam 1 mg by mouth every morning and 2nd dose around 2 pm and 3rd dose at night if needed.

## 2011-10-16 NOTE — Progress Notes (Signed)
Presenting complaint; Follow for abdominal pain, early satiety and anorexia. Subjective: George Bradley is a 71 year old Caucasian male whom I saw on 09/30/2011 for abdominal pain ever since he had prostate tube placed as well as anorexia and early satiety. He was given hydrocodone and advised to take it on schedule and he was also begun on Lexapro. Since his last visit he has had a PET scan which was negative. His wife [name] took him to [facility] ER on 10/09/2011 here he had abdominopelvic CT and nothing acute was found. He had upper GI series on 10-23-11 results of which are reviewed below. Patient was advised to increase his pantoprazole to 40 mg twice daily. Today he feels better but just said it was a terrible day with abdominal pain altered along. He says he gets most relief with lorazepam. He is trying to eat more food and is also drinking nutritional supplements. He has lost 7 pounds since his last visit. He denies nausea vomiting fever chills melena or rectal bleeding. He is having to take laxatives on an as-needed basis for constipation. He has an appointment to see his surgeon tomorrow so that he can have gastrostomy tube removed.  Current Medications: Current Outpatient Prescriptions  Medication Sig Dispense Refill  . aspirin EC 81 MG tablet Take 81 mg by mouth daily.        Marland Kitchen escitalopram (LEXAPRO) 10 MG tablet Take 1 tablet (10 mg total) by mouth daily.  30 tablet  5  . HYDROcodone-acetaminophen (VICODIN) 5-500 MG per tablet Take 0.5-1 tablets by mouth every 4 (four) hours as needed.      Marland Kitchen LORazepam (ATIVAN) 0.5 MG tablet Take 0.5 mg by mouth every 8 (eight) hours. As Needed       . Nutritional Supplements (FEEDING SUPPLEMENT, OSMOLITE 1.5 CAL,) LIQD Take 1,000 mLs by mouth. Administer through the night       . rosuvastatin (CRESTOR) 20 MG tablet Take 20 mg by mouth daily.        Marland Kitchen warfarin (COUMADIN) 2 MG tablet Take 2 mg by mouth as directed. Daily except on Fridays & Mondays.      Marland Kitchen  warfarin (COUMADIN) 5 MG tablet Take 2.5 mg by mouth 2 (two) times a week. Mondays and Fridays      . zolpidem (AMBIEN) 10 MG tablet Take 5-10 mg by mouth at bedtime as needed. For sleep.      . pantoprazole (PROTONIX) 40 MG tablet Take 1 tablet (40 mg total) by mouth daily.  30 tablet  5    Objective: Blood pressure 110/68, pulse 70, temperature 97.5 F (36.4 C), temperature source Oral, resp. rate 12, height 6\' 1"  (1.854 m), weight 182 lb (82.555 kg). Patient does not appear to be in any distress. Conjunctiva is pink. Sclera is nonicteric Oral pharyngeal mucosa is normal. No neck masses or thyromegaly noted. Abdomen is symmetrical. He has a G-tube in place. His abdomen is soft except he is from this in mid epigastric region just below the site/scar very he had drain in the time of cardiac surgery. He is mildly tender in this area. No organomegaly or masses noted.  No LE edema or clubbing noted.  Labs/studies Results: Upper GI series from 10-23-11 revealed pronounced GE reflux, tiny esophageal web and no evidence of gastric abnormality or torsion.  Assessment: #1. Abdominal pain. Etiology of this pain remains unknown; hopefully removal of G-tube would alleviate this pain. He has an appointment with Dr. Lodema Pilot in a.m. #2. GERD. He is  now on double dose PPI.. #3. Weight loss. Secondary to diminished calorie intake; he has no significant structural abnormality to his esophagus stomach and proximal small intestine and he needs to force himself to ingest more foods. If he is going to drink nutritional supplement he should take sips at a time rather than gulping it altogether. #4. Stress disorder. Secondary to ongoing illness. He should continue  Lexapro and lorazepam but he should take lorazepam at least twice daily.   Plan: New prescription given for lorazepam 1 mg 3 times a day as needed; 90 doses with one refill. New prescription also given for pantoprazole. He can take OTC  Peri-Colace one twice daily for constipation. Office visit in 8 weeks

## 2011-10-17 ENCOUNTER — Ambulatory Visit (INDEPENDENT_AMBULATORY_CARE_PROVIDER_SITE_OTHER): Payer: Medicare Other | Admitting: General Surgery

## 2011-10-17 ENCOUNTER — Encounter (INDEPENDENT_AMBULATORY_CARE_PROVIDER_SITE_OTHER): Payer: Self-pay | Admitting: General Surgery

## 2011-10-17 VITALS — BP 132/76 | HR 92 | Temp 97.6°F | Resp 16 | Ht 73.0 in | Wt 181.6 lb

## 2011-10-17 DIAGNOSIS — Z7901 Long term (current) use of anticoagulants: Secondary | ICD-10-CM | POA: Diagnosis not present

## 2011-10-17 DIAGNOSIS — I82409 Acute embolism and thrombosis of unspecified deep veins of unspecified lower extremity: Secondary | ICD-10-CM | POA: Diagnosis not present

## 2011-10-17 DIAGNOSIS — C029 Malignant neoplasm of tongue, unspecified: Secondary | ICD-10-CM | POA: Diagnosis not present

## 2011-10-17 DIAGNOSIS — R1012 Left upper quadrant pain: Secondary | ICD-10-CM

## 2011-10-17 NOTE — Progress Notes (Signed)
Subjective:     Patient ID: George Bradley, male   DOB: 09-13-1941, 71 y.o.   MRN: [id number]  HPI This patient follows up for status post laparoscopic assisted gastrostomy tube placement several months ago for treatment during radiation for a tongue cancer. He states that he has not really been gaining weight but his main complaint is abdominal pain after eating. He can't take significant volumes of food. He's had CT scans and a recent upper GI which were normal and he feels that removal of his PEG tube will help him be able to eat more.  He has seen his GI doctor who feels that PEG removal would be helpful. He is on coumadin for mesenteric branch vessel thrombosis but recent CT showed resolution of this.  Review of Systems     Objective:   Physical Exam Abdomen soft, and nt, PEG removed, slight ooze of granulation tissue at site, no bleeding when bandaged.    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 He expressed understanding and is but desires to have is to remove and he is confident that the reason why he cannot eat that much and because of this tube in his abdominal pain which is likely associated with the tube. I removed his tube at the bedside today without apparent complication and his wound was hemostatic. I recommended that if he has bleeding in the stools or black stools or bleeding from his PEG tube site that he presented to the emergency room for evaluation given his Coumadin use. I explained that this should seal spontaneously but if this does not close then he may need surgical closure.    Plan:     He can follow up with me on a p.r.n. basis.

## 2011-10-20 ENCOUNTER — Ambulatory Visit (INDEPENDENT_AMBULATORY_CARE_PROVIDER_SITE_OTHER): Payer: Self-pay | Admitting: Internal Medicine

## 2011-10-28 ENCOUNTER — Ambulatory Visit (INDEPENDENT_AMBULATORY_CARE_PROVIDER_SITE_OTHER): Payer: Self-pay | Admitting: Internal Medicine

## 2011-10-30 ENCOUNTER — Ambulatory Visit: Payer: Medicare Other | Admitting: Nutrition

## 2011-10-30 ENCOUNTER — Telehealth: Payer: Self-pay | Admitting: Nutrition

## 2011-10-30 NOTE — Progress Notes (Signed)
I spoke with George Bradley on the phone today to follow up on tube feeding. The patient reports that he has been cleared of his cancer.  His CT scan came back clean.  He was able to have his feeding tube removed and he reports that was a reasonable process.  He is not having any pain from that.  He reports that he has been able to actually consume foods a little bit easier now since the tube has been removed.  His weight has decreased and was documented as about 181.6 pounds on January 25th.  He is in a healthy BMI range.  His BMI is 23.96.  He is not complaining of any nutritional issues.  NUTRITION DIAGNOSIS:  Inadequate oral intake has resolved.    I am very pleased for George Bradley that he is now able to eat by mouth and that his feeding tube has been removed.  I provided support and encouragement for him.  He is always welcome to call me if he has any questions or concerns.    ______________________________ Zenovia Jarred, RD, LDN Clinical Nutrition Specialist BN/MEDQ  D:  10/30/2011  T:  10/30/2011  Job:  [id number]

## 2011-10-30 NOTE — Telephone Encounter (Signed)
I noted patient had his PEG removed and I wanted to follow up with him and see how he was doing.  I left a message for him to call me back.

## 2011-11-03 ENCOUNTER — Other Ambulatory Visit: Payer: Self-pay | Admitting: *Deleted

## 2011-11-03 ENCOUNTER — Telehealth: Payer: Self-pay | Admitting: *Deleted

## 2011-11-03 ENCOUNTER — Other Ambulatory Visit: Payer: Self-pay | Admitting: Oncology

## 2011-11-03 DIAGNOSIS — C109 Malignant neoplasm of oropharynx, unspecified: Secondary | ICD-10-CM

## 2011-11-03 NOTE — Telephone Encounter (Signed)
Wife called to report that pt's PCP,  Dr. Timothy Lasso,  Has agreed to bridge pt w/ lovenox, so he can go off coumadin for [facility] Cath removal.  Need Dr. Gaylyn Rong to order the removal again and arrange schedule w/ I.R.   Dr. Gaylyn Rong agreed to order the removal again. Called [facility] At [facility] and s/w Kathlene November.  Gave him Dr. Timothy Lasso [phone], to obtain orders regarding coumadin.   Sent Tx plan to scheduling to cancel all upcoming Port flush appts.Marland Kitchen

## 2011-11-04 ENCOUNTER — Telehealth (INDEPENDENT_AMBULATORY_CARE_PROVIDER_SITE_OTHER): Payer: Self-pay | Admitting: *Deleted

## 2011-11-04 ENCOUNTER — Telehealth: Payer: Self-pay | Admitting: Oncology

## 2011-11-04 NOTE — Telephone Encounter (Signed)
Per 2/11 pof cx'd all flush appts due to pt having port removed. S/w pt he is aware and understand he is to keep other appts July/aug/sept.

## 2011-11-04 NOTE — Telephone Encounter (Signed)
George Bradley,patient's wife , left a message on the voicemail on 11-03-11. She states that the patient had peg Tube removed a couple of weeks ago. Still has a tightness in the abdominal area. Eating well by mouth and weight is up 1lb.

## 2011-11-04 NOTE — Telephone Encounter (Signed)
Noted that PEG is out and he is feeling better

## 2011-11-05 ENCOUNTER — Encounter (HOSPITAL_COMMUNITY): Payer: Self-pay | Admitting: Pharmacy Technician

## 2011-11-05 ENCOUNTER — Ambulatory Visit: Payer: Medicare Other | Attending: Oncology

## 2011-11-05 DIAGNOSIS — R4789 Other speech disturbances: Secondary | ICD-10-CM | POA: Diagnosis not present

## 2011-11-05 DIAGNOSIS — IMO0001 Reserved for inherently not codable concepts without codable children: Secondary | ICD-10-CM | POA: Insufficient documentation

## 2011-11-05 DIAGNOSIS — Z7901 Long term (current) use of anticoagulants: Secondary | ICD-10-CM | POA: Diagnosis not present

## 2011-11-05 DIAGNOSIS — R070 Pain in throat: Secondary | ICD-10-CM | POA: Diagnosis not present

## 2011-11-05 DIAGNOSIS — C029 Malignant neoplasm of tongue, unspecified: Secondary | ICD-10-CM | POA: Insufficient documentation

## 2011-11-05 DIAGNOSIS — I82409 Acute embolism and thrombosis of unspecified deep veins of unspecified lower extremity: Secondary | ICD-10-CM | POA: Diagnosis not present

## 2011-11-07 ENCOUNTER — Telehealth (INDEPENDENT_AMBULATORY_CARE_PROVIDER_SITE_OTHER): Payer: Self-pay | Admitting: General Surgery

## 2011-11-07 ENCOUNTER — Ambulatory Visit (INDEPENDENT_AMBULATORY_CARE_PROVIDER_SITE_OTHER): Payer: Medicare Other | Admitting: General Surgery

## 2011-11-07 ENCOUNTER — Ambulatory Visit (INDEPENDENT_AMBULATORY_CARE_PROVIDER_SITE_OTHER): Payer: Medicare Other | Admitting: Cardiology

## 2011-11-07 ENCOUNTER — Encounter: Payer: Self-pay | Admitting: Cardiology

## 2011-11-07 ENCOUNTER — Encounter (INDEPENDENT_AMBULATORY_CARE_PROVIDER_SITE_OTHER): Payer: Self-pay | Admitting: General Surgery

## 2011-11-07 ENCOUNTER — Other Ambulatory Visit: Payer: Self-pay | Admitting: Radiology

## 2011-11-07 VITALS — BP 132/88 | HR 88 | Temp 97.6°F | Resp 16 | Ht 73.0 in | Wt 181.2 lb

## 2011-11-07 VITALS — BP 129/90 | HR 92 | Ht 73.0 in | Wt 181.0 lb

## 2011-11-07 DIAGNOSIS — E7800 Pure hypercholesterolemia, unspecified: Secondary | ICD-10-CM | POA: Diagnosis not present

## 2011-11-07 DIAGNOSIS — I2510 Atherosclerotic heart disease of native coronary artery without angina pectoris: Secondary | ICD-10-CM | POA: Diagnosis not present

## 2011-11-07 DIAGNOSIS — I259 Chronic ischemic heart disease, unspecified: Secondary | ICD-10-CM

## 2011-11-07 DIAGNOSIS — K55069 Acute infarction of intestine, part and extent unspecified: Secondary | ICD-10-CM

## 2011-11-07 DIAGNOSIS — I350 Nonrheumatic aortic (valve) stenosis: Secondary | ICD-10-CM

## 2011-11-07 DIAGNOSIS — K55059 Acute (reversible) ischemia of intestine, part and extent unspecified: Secondary | ICD-10-CM | POA: Diagnosis not present

## 2011-11-07 DIAGNOSIS — Z951 Presence of aortocoronary bypass graft: Secondary | ICD-10-CM | POA: Diagnosis not present

## 2011-11-07 DIAGNOSIS — Z09 Encounter for follow-up examination after completed treatment for conditions other than malignant neoplasm: Secondary | ICD-10-CM

## 2011-11-07 DIAGNOSIS — I359 Nonrheumatic aortic valve disorder, unspecified: Secondary | ICD-10-CM

## 2011-11-07 NOTE — Telephone Encounter (Signed)
Patient's wife called re: drain site. They thought it had healed where PEG tube was removed, but noticed last night it had a small amount of bleeding, it was still open and it looks like "gut is coming out." No drainage from incision. No redness, fevers, or any other symptoms. Please advise.

## 2011-11-07 NOTE — Patient Instructions (Signed)
You have some granulation tissue at your old PEG site. It is simply some extra skin 'scar' tissue.  Sometimes, it can get irritated and bleed.  Keep area covered with a gauze for next several days. Change gauze daily. Do not put neosporin on it.

## 2011-11-07 NOTE — Progress Notes (Signed)
Subjective:     Patient ID: George Bradley, male   DOB: 09-Jul-1941, 71 y.o.   MRN: [id number]  HPI 71 year old white male comes in for followup because of bloody drainage from his old feeding tube site. Dr. Biagio Quint placed a G-tube several months ago to help with the patient's nutrition given his history of oropharyngeal cancer. His G-tube was removed on January 25 in the office. He states yesterday he noticed some blood-tinged drainage from his old G-tube site. It was only several drops. He denies any drainage of green or yellowish fluid. He states that he has chronic abdominal pain and there is been no change in it. He states that he is maintaining his weight. He denies any fevers or chills.  Review of Systems     Objective:   Physical Exam BP 132/88  Pulse 88  Temp(Src) 97.6 F (36.4 C) (Temporal)  Resp 16  Ht 6\' 1"  (1.854 m)  Wt 181 lb 3.2 oz (82.192 kg)  BMI 23.91 kg/m2 Abdomen is soft, nontender, nondistended. Well-healed incisions. In the left upper quadrant is the old G-tube site. There is a small amount of granulation tissue in the midportion of the incision. There is no surrounding cellulitis, fluctuance, or induration. I cannot express any drainage.       Assessment:     71 year old Caucasian male status post gastrostomy tube removal with a small amount of granulation tissue    Plan:     I explained to the patient would granulation tissue it is. We placed silver nitrate on the granulation tissue. It was covered with dry gauze. He was given wound care instructions.  Followup Dr. Biagio Quint in 4 weeks   Mary Sella. Andrey Campanile, MD, FACS General, Bariatric, & Minimally Invasive Surgery [facility], [place]

## 2011-11-07 NOTE — Telephone Encounter (Signed)
Spoke to Mr. Wacha he is scheduled to be seen in Urgent Office today w/Dr. Andrey Campanile to recheck area from Peg Tube removal.  Patient not able to come in until after 4:00 today.  Dr. Biagio Quint spoke to Dr. Andrey Campanile regarding patient.

## 2011-11-07 NOTE — Assessment & Plan Note (Signed)
He is currently on Coumadin. This will be held for his Port-A-Cath removal. By CT scan his thrombosis has resolved. Anticipate anticoagulation for 6 months.

## 2011-11-07 NOTE — Progress Notes (Signed)
Subjective:   George Bradley comes in today for followup visit. He is a former patient of Dr. Deborah Chalk.  He had coronary artery bypass grafting in 2000. His last catheterization was in 2007 and showed patent saphenous vein grafts x3 with a patent left internal mammary artery and well-preserved LV function. His EF is 55%. His last nuclear stress test in July of 2012 showed a small fixed apical defect with normal ejection fraction.  He a tendency toward hypotension and some lightheadedness on occasion. He is not on an ACE inhibitor or beta blocker. He's had a history of what sounds like a Maze procedure when he lived in [place]. His other problems include hyperlipidemia, insomnia, and a history of depression. George Bradley has had a very difficult year. 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 He has undergone chemotherapy and extensive radiation therapy. He underwent surgical placement of a gastrostomy tube. 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 Subsequent CT scan showed resolution of the thrombosis. He has since had his gastrostomy tube removed. He is scheduled for removal of his Port-A-Cath next week.   Current Outpatient Prescriptions  Medication Sig Dispense Refill  . aspirin EC 81 MG tablet Take 81 mg by mouth at bedtime.       . docusate-casanthranol (PERICOLACE) 100-30 MG per capsule Take 1 capsule by mouth daily as needed. Constipation      . escitalopram (LEXAPRO) 10 MG tablet Take 10 mg by mouth at bedtime.      Marland Kitchen HYDROcodone-acetaminophen (VICODIN) 5-500 MG per tablet Take 0.5-1 tablets by mouth every 4 (four) hours as needed. Pain       . LORazepam (ATIVAN) 1 MG tablet Take 1 tablet (1 mg total) by mouth every 8 (eight) hours as needed for anxiety.  90 tablet  1  . pantoprazole (PROTONIX) 40 MG tablet Take 1 tablet (40 mg total) by mouth 2 (two) times daily before a meal.  60 tablet  5  . rosuvastatin  (CRESTOR) 20 MG tablet Take 20 mg by mouth at bedtime.       Marland Kitchen warfarin (COUMADIN) 2 MG tablet Take 2 mg by mouth as directed.       . zolpidem (AMBIEN) 10 MG tablet Take 5-10 mg by mouth at bedtime as needed. For sleep.        No Known Allergies  Patient Active Problem List  Diagnoses  . Ischemic heart disease  . Hypercholesterolemia  . Aortic stenosis  . Protein-calorie malnutrition, moderate  . Oropharyngeal cancer  . Weight loss, abnormal  . Renal artery stenosis, native, bilateral  . Abdominal pain, acute, periumbilical  . Thrombosis of mesenteric vein  . Depression    History  Smoking status  . Former Smoker  . Types: Cigars  Smokeless tobacco  . Former Neurosurgeon  . Quit date: 05/06/2011  Comment: 2 cigars a week    History  Alcohol Use No    occasionally glass of wine    Family History  Problem Relation Age of Onset  . Heart disease Father   . Heart disease Brother   . Heart disease Sister   . Cancer Sister     breast cancer  . Cancer Mother     breast ca  . Hyperlipidemia Son     Review of Systems:   Review of systems is positive for weight loss. He complains of weakness. He has a chronically dry mouth. He feels that  he is eating fairly well.  All other systems were reviewed and are negative.   Physical Exam:    BP 129/90  Pulse 92  Ht 6\' 1"  (1.854 m)  Wt 181 lb (82.101 kg)  BMI 23.88 kg/m2  The head is normocephalic and atraumatic.  Pupils are equally round and reactive to light.  Sclerae nonicteric.  Conjunctiva is clear.  Oropharynx is clear.   Neck is supple there are no masses.  Thyroid is not enlarged.  There is no lymphadenopathy.  Lungs are clear.  Chest is symmetric.  Heart shows a regular rate and rhythm.  S1 and S2 are normal.  There is a soft systolic outflow murmur.  Abdomen is soft normal bowel sounds. He has a bandage over his gastrostomy site. There is no organomegaly.  Genital and rectal deferred.  Extremities are without edema.   Peripheral pulses are adequate.  Neurologically intact.he is hyperreflexic in the lower extremities has adequate pedal pulses.  Full range of motion.  The patient is not depressed.  Skin is warm and dry.  Laboratory data: ECG demonstrates normal sinus rhythm with first-degree AV block. He has a right bundle branch block. Is nonspecific ST-T wave abnormality.  Assessment / Plan:

## 2011-11-07 NOTE — Assessment & Plan Note (Signed)
His nuclear stress test in June of this past year was unremarkable. He remains asymptomatic. He will continue with his prior risk factor modification and I will followup again in 6 months.

## 2011-11-07 NOTE — Patient Instructions (Signed)
I will see you again in 6 months. Continue your same therapy.

## 2011-11-07 NOTE — Assessment & Plan Note (Signed)
Mild by echocardiogram in May of 2010. Exam is consistent with mild aortic stenosis.

## 2011-11-10 ENCOUNTER — Other Ambulatory Visit: Payer: Self-pay | Admitting: Cardiology

## 2011-11-10 ENCOUNTER — Other Ambulatory Visit: Payer: Self-pay | Admitting: Radiology

## 2011-11-10 ENCOUNTER — Ambulatory Visit: Payer: Medicare Other | Admitting: Radiation Oncology

## 2011-11-10 MED ORDER — ROSUVASTATIN CALCIUM 20 MG PO TABS: 20.0000 mg | ORAL_TABLET | Freq: Every day | ORAL | Status: DC

## 2011-11-10 NOTE — Telephone Encounter (Signed)
Pt needs refill for crestor sent into [facility] fax# [phone]

## 2011-11-11 ENCOUNTER — Ambulatory Visit (HOSPITAL_COMMUNITY)
Admission: RE | Admit: 2011-11-11 | Discharge: 2011-11-11 | Disposition: A | Discharge status: Home / Self Care | Payer: Medicare Other | Source: Ambulatory Visit | Attending: Oncology | Admitting: Oncology

## 2011-11-11 VITALS — BP 125/76 | HR 67 | Resp 24

## 2011-11-11 DIAGNOSIS — Z79899 Other long term (current) drug therapy: Secondary | ICD-10-CM | POA: Insufficient documentation

## 2011-11-11 DIAGNOSIS — Z7982 Long term (current) use of aspirin: Secondary | ICD-10-CM | POA: Diagnosis not present

## 2011-11-11 DIAGNOSIS — C109 Malignant neoplasm of oropharynx, unspecified: Secondary | ICD-10-CM | POA: Insufficient documentation

## 2011-11-11 DIAGNOSIS — E785 Hyperlipidemia, unspecified: Secondary | ICD-10-CM | POA: Insufficient documentation

## 2011-11-11 DIAGNOSIS — Z452 Encounter for adjustment and management of vascular access device: Secondary | ICD-10-CM | POA: Insufficient documentation

## 2011-11-11 DIAGNOSIS — I2510 Atherosclerotic heart disease of native coronary artery without angina pectoris: Secondary | ICD-10-CM | POA: Diagnosis not present

## 2011-11-11 DIAGNOSIS — Z09 Encounter for follow-up examination after completed treatment for conditions other than malignant neoplasm: Secondary | ICD-10-CM | POA: Diagnosis not present

## 2011-11-11 DIAGNOSIS — Z931 Gastrostomy status: Secondary | ICD-10-CM | POA: Diagnosis not present

## 2011-11-11 DIAGNOSIS — Z951 Presence of aortocoronary bypass graft: Secondary | ICD-10-CM | POA: Diagnosis not present

## 2011-11-11 LAB — CBC
Hemoglobin: 13.3 g/dL (ref 13.0–17.0)
Platelets: 141 10*3/uL — ABNORMAL LOW (ref 150–400)
RBC: 4.07 MIL/uL — ABNORMAL LOW (ref 4.22–5.81)
WBC: 3.7 10*3/uL — ABNORMAL LOW (ref 4.0–10.5)

## 2011-11-11 LAB — PROTIME-INR
INR: 1.07 (ref 0.00–1.49)
Prothrombin Time: 14.1 s (ref 11.6–15.2)

## 2011-11-11 LAB — APTT: aPTT: 26 s (ref 24–37)

## 2011-11-11 MED ORDER — MIDAZOLAM HCL 2 MG/2ML IJ SOLN
INTRAMUSCULAR | Status: AC
  Filled 2011-11-11: qty 2

## 2011-11-11 MED ORDER — FENTANYL CITRATE 0.05 MG/ML IJ SOLN
INTRAMUSCULAR | Status: AC | PRN
  Administered 2011-11-11: 100 ug via INTRAVENOUS

## 2011-11-11 MED ORDER — SODIUM CHLORIDE 0.9 % IV SOLN: INTRAVENOUS | Status: DC

## 2011-11-11 MED ORDER — MIDAZOLAM HCL 5 MG/5ML IJ SOLN
INTRAMUSCULAR | Status: AC | PRN
  Administered 2011-11-11 (×2): 2 mg via INTRAVENOUS

## 2011-11-11 MED ORDER — CEFAZOLIN SODIUM-DEXTROSE 2-3 GM-% IV SOLR: 2.0000 g | INTRAVENOUS | Status: DC

## 2011-11-11 MED ORDER — FENTANYL CITRATE 0.05 MG/ML IJ SOLN
INTRAMUSCULAR | Status: AC
  Filled 2011-11-11: qty 2

## 2011-11-11 MED ORDER — CEFAZOLIN SODIUM 1-5 GM-% IV SOLN
INTRAVENOUS | Status: AC
  Filled 2011-11-11: qty 50

## 2011-11-11 NOTE — H&P (Signed)
George Bradley is an 71 y.o. male.   Chief Complaint: "I'm cancer free!!"   HPI: History of H&N ca who has finished his treatment, doing well, presents today for PAC removal with moderate sedation.  Just seen by Cardiology within last few weeks who has cleared him from cardiac standpoint to come off coumadin for port to be removed.   Past Medical History  Diagnosis Date  . Ischemic heart disease   . Aortic stenosis, mild   . Hyperlipidemia   . Insomnia   . Fatigue   . Dizziness   . Hypotension     orthostatic  . History of echocardiogram 02/09/2009    EF -  55-60%  . History of cardiovascular stress test 03/14/2009    EF - 57%   /  mild anterior hyperkinesia with discrete anterior scar consistent with previous infarction but unchanged from previous studies.  Overall, stable findings -- Colleen Can. Deborah Chalk, MD  . Weakness   . ASCVD (arteriosclerotic cardiovascular disease)   . Arrhythmia     known history of  . Weight loss, abnormal 2012  . 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 Protein-calorie malnutrition, moderate 2012    on PEG tube feeding  . Cellulitis of artificial external opening, post-operative 2012    of PEG tube site  . Mesenteric thrombosis     Past Surgical History  Procedure Date  . 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 Cardiac catheterization 2007    EF - 55%  . Ablation of dysrhythmic focus   . Esophagogastroduodenoscopy 04/24/2011    Procedure: ESOPHAGOGASTRODUODENOSCOPY (EGD);  Surgeon: Malissa Hippo, MD;  Location: [facility];  Service: Endoscopy;  Laterality: N/A;  8:30 am  . Peg placement 04/24/2011    Procedure: PERCUTANEOUS ENDOSCOPIC GASTROSTOMY (PEG) PLACEMENT;  Surgeon: Malissa Hippo, MD;  Location: [facility];  Service: Endoscopy;  Laterality: N/A;    Family History  Problem Relation Age of Onset  . Heart disease Father   . Heart disease Brother   . Heart disease Sister   . Cancer Sister     breast cancer  . Cancer Mother     breast ca   . Hyperlipidemia Son    Social History:  reports that he has quit smoking. His smoking use included Cigars. He quit smokeless tobacco use about 6 months ago. He reports that he does not drink alcohol or use illicit drugs.  Allergies: No Known Allergies  Medications Prior to Admission  Medication Sig Dispense Refill  . aspirin EC 81 MG tablet Take 81 mg by mouth at bedtime.       . docusate-casanthranol (PERICOLACE) 100-30 MG per capsule Take 1 capsule by mouth daily as needed. Constipation      . escitalopram (LEXAPRO) 10 MG tablet Take 10 mg by mouth at bedtime.      Marland Kitchen HYDROcodone-acetaminophen (VICODIN) 5-500 MG per tablet Take 0.5-1 tablets by mouth every 4 (four) hours as needed. Pain       . LORazepam (ATIVAN) 1 MG tablet Take 1 tablet (1 mg total) by mouth every 8 (eight) hours as needed for anxiety.  90 tablet  1  . pantoprazole (PROTONIX) 40 MG tablet Take 1 tablet (40 mg total) by mouth 2 (two) times daily before a meal.  60 tablet  5  . rosuvastatin (CRESTOR) 20 MG tablet Take 1 tablet (20 mg total) by mouth at bedtime.  90 tablet  2  . warfarin (COUMADIN) 2  MG tablet Take 2 mg by mouth as directed.       . zolpidem (AMBIEN) 10 MG tablet Take 5-10 mg by mouth at bedtime as needed. For sleep.       Medications Prior to Admission  Medication Dose Route Frequency Provider Last Rate Last Dose  . 0.9 %  sodium chloride infusion   Intravenous Continuous D Jeananne Rama, PA      . ceFAZolin (ANCEF) IVPB 2 g/50 mL premix  2 g Intravenous On Call D Kevin Allred, PA      . fentaNYL (SUBLIMAZE) 0.05 MG/ML injection           . midazolam (VERSED) 2 MG/2ML injection             Results for orders placed during the hospital encounter of 11/11/11 (from the past 48 hour(s))  CBC     Status: Abnormal   Collection Time   11/11/11 12:35 PM      Component Value Range Comment   WBC 3.7 (*) 4.0 - 10.5 (K/uL)    RBC 4.07 (*) 4.22 - 5.81 (MIL/uL)    Hemoglobin 13.3  13.0 - 17.0 (g/dL)    HCT  81.1 (*) 91.4 - 52.0 (%)    MCV 95.1  78.0 - 100.0 (fL)    MCH 32.7  26.0 - 34.0 (pg)    MCHC 34.4  30.0 - 36.0 (g/dL)    RDW 78.2  95.6 - 21.3 (%)    Platelets 141 (*) 150 - 400 (K/uL)    No results found.  Review of Systems  Constitutional: Positive for weight loss. Negative for fever and chills.  Respiratory: Negative.   Cardiovascular: Negative.   Skin: Negative.   Neurological: Positive for weakness.  Psychiatric/Behavioral: Negative.     Blood pressure 120/74, pulse 64, resp. rate 18, SpO2 96.00%. Physical Exam  Constitutional: He is oriented to person, place, and time. He appears well-developed and well-nourished. No distress.  HENT:  Head: Normocephalic and atraumatic.  Cardiovascular: Normal rate, regular rhythm and normal heart sounds.  Exam reveals no gallop and no friction rub.   No murmur heard. Respiratory: Effort normal and breath sounds normal. No respiratory distress. He has no wheezes. He has no rales.  GI: Soft. Bowel sounds are normal.  Neurological: He is alert and oriented to person, place, and time.  Skin: Skin is warm and dry.  Psychiatric: He has a normal mood and affect. His behavior is normal. Thought content normal.     Assessment/Plan Port removal procedure discussed with patient in detail along with Dr. Grace Isaac.  All patient's questions answered, written consent obtained.  Pending labs at this time - proceed with removal if all WNL. May resume coumadin tomorrow if no bleeding issues with removal.   Lourdez Mcgahan D 11/11/2011, 1:12 PM

## 2011-11-11 NOTE — Procedures (Signed)
Successful removal of right anterior chest wall port-a-cath. No immediate post procedural complications.  

## 2011-11-11 NOTE — Discharge Instructions (Signed)
Moderate Sedation, Adult °Moderate sedation is given to help you relax or even sleep through a procedure. You may remain sleepy, be clumsy, or have poor balance for several hours following this procedure. Arrange for a responsible adult, family member, or friend to take you home. A responsible adult should stay with you for at least 24 hours or until the medicines have worn off. °· Do not participate in any activities where you could become injured for the next 24 hours, or until you feel normal again. Do not:  °· Drive.  °· Swim.  °· Ride a bicycle.  °· Operate heavy machinery.  °· Cook.  °· Use power tools.  °· Climb ladders.  °· Work at heights.  °· Do not make important decisions or sign legal documents until you are improved.  °· Vomiting may occur if you eat too soon. When you can drink without vomiting, try water, juice, or soup. Try solid foods if you feel little or no nausea.  °· Only take over-the-counter or prescription medications for pain, discomfort, or fever as directed by your caregiver.If pain medications have been prescribed for you, ask your caregiver how soon it is safe to take them.  °· Make sure you and your family fully understands everything about the medication given to you. Make sure you understand what side effects may occur.  °· You should not drink alcohol, take sleeping pills, or medications that cause drowsiness for at least 24 hours.  °· If you smoke, do not smoke alone.  °· If you are feeling better, you may resume normal activities 24 hours after receiving sedation.  °· Keep all appointments as scheduled. Follow all instructions.  °· Ask questions if you do not understand.  °SEEK MEDICAL CARE IF:  °· Your skin is pale or bluish in color.  °· You continue to feel sick to your stomach (nauseous) or throw up (vomit).  °· Your pain is getting worse and not helped by medication.  °· You have bleeding or swelling.  °· You are still sleepy or feeling clumsy after 24 hours.  °SEEK IMMEDIATE  MEDICAL CARE IF:  °· You develop a rash.  °· You have difficulty breathing.  °· You develop any type of allergic problem.  °· You have a fever.  °Document Released: 06/03/2001 Document Revised: 05/21/2011 Document Reviewed: 10/25/2007 °[employer]® Patient Information ©2012 [employer]. °

## 2011-11-14 DIAGNOSIS — I82409 Acute embolism and thrombosis of unspecified deep veins of unspecified lower extremity: Secondary | ICD-10-CM | POA: Diagnosis not present

## 2011-11-14 DIAGNOSIS — Z7901 Long term (current) use of anticoagulants: Secondary | ICD-10-CM | POA: Diagnosis not present

## 2011-11-17 ENCOUNTER — Ambulatory Visit
Admission: RE | Admit: 2011-11-17 | Discharge: 2011-11-17 | Disposition: A | Discharge status: Home / Self Care | Payer: Medicare Other | Source: Ambulatory Visit | Attending: Radiation Oncology | Admitting: Radiation Oncology

## 2011-11-17 DIAGNOSIS — Z7901 Long term (current) use of anticoagulants: Secondary | ICD-10-CM | POA: Diagnosis not present

## 2011-11-17 DIAGNOSIS — I82409 Acute embolism and thrombosis of unspecified deep veins of unspecified lower extremity: Secondary | ICD-10-CM | POA: Diagnosis not present

## 2011-11-17 DIAGNOSIS — Z85810 Personal history of malignant neoplasm of tongue: Secondary | ICD-10-CM | POA: Insufficient documentation

## 2011-11-17 DIAGNOSIS — C01 Malignant neoplasm of base of tongue: Secondary | ICD-10-CM | POA: Diagnosis not present

## 2011-11-17 NOTE — Progress Notes (Signed)
CC:   Suzanna Obey, M.D. Exie Parody, M.D. Cindra Eves, D.D.S. Gwen Pounds, MD  DIAGNOSIS:  Stage IVA base of tongue carcinoma.  INTERVAL SINCE RADIATION THERAPY:  4 months.  NARRATIVE:  Mr. Romer comes in today for routine followup.  He clinically seems to be making good progress.  The patient can pretty much eat any food he desires at this time.  He has to, however, be careful with swallowing.  The patient continues to be seen in speech pathology approximately every month concerning swallowing exercises. The patient denies any otalgia.  His taste continues to improve, but is not back to normal.  The patient did undergo a PET scan in January of this year, showing no residual hypermetabolic activity in the base of tongue or neck area.  The patient has had his Port-A-Cath removed, as well as his gastrostomy feeding tube.  PHYSICAL EXAMINATION:  Vital Signs:  The patient's temperature is 99.9, pulse 86, blood pressure is 131/79, weight is 183, which is down approximately 15 pounds compared to his pretreatment weight.  Lungs: Examination of the lungs reveals them to be clear.  Heart:  Has a regular rhythm and rate.  Neck:  The neck exam reveals no palpable cervical or supraclavicular adenopathy.  HEENT:  Examination of the oral cavity reveals the mucosa to be mildly dry.  There are no mucosal lesions noted.  On indirect mirror examination, there are no lesions noted in the base of tongue area.  Palpation along the base of tongue area reveals no suspicious induration.  IMPRESSION AND PLAN:  Clinically and radiographically no evident disease.  I have encouraged the patient to follow up with Dr. Jearld Fenton in the next couple of months.  The patient will return for routine followup in Radiation Oncology in 3 to 4 months.  The patient will also be seeing Dr. Gaylyn Rong in the next several weeks.    ______________________________ Billie Lade, Ph.D., M.D. JDK/MEDQ  D:  11/17/2011  T:   11/17/2011  Job:  [id number]

## 2011-11-17 NOTE — Progress Notes (Signed)
Here for routine follow up for completion of radiation for tongue ca August thru July 04, 2011. Appetite good but does have some difficulty swallowing.Weight maintained in 180's. Porta-cath removal last week area bruised greater than fifty cent piece. Area also painful.

## 2011-11-19 DIAGNOSIS — T148XXA Other injury of unspecified body region, initial encounter: Secondary | ICD-10-CM | POA: Diagnosis not present

## 2011-11-19 DIAGNOSIS — I82409 Acute embolism and thrombosis of unspecified deep veins of unspecified lower extremity: Secondary | ICD-10-CM | POA: Diagnosis not present

## 2011-11-19 DIAGNOSIS — Z7901 Long term (current) use of anticoagulants: Secondary | ICD-10-CM | POA: Diagnosis not present

## 2011-11-21 DIAGNOSIS — Z7901 Long term (current) use of anticoagulants: Secondary | ICD-10-CM | POA: Diagnosis not present

## 2011-11-21 DIAGNOSIS — I82409 Acute embolism and thrombosis of unspecified deep veins of unspecified lower extremity: Secondary | ICD-10-CM | POA: Diagnosis not present

## 2011-11-21 DIAGNOSIS — T148XXA Other injury of unspecified body region, initial encounter: Secondary | ICD-10-CM | POA: Diagnosis not present

## 2011-11-24 ENCOUNTER — Other Ambulatory Visit (HOSPITAL_COMMUNITY): Payer: Self-pay | Admitting: Interventional Radiology

## 2011-11-24 ENCOUNTER — Ambulatory Visit (HOSPITAL_COMMUNITY)
Admission: RE | Admit: 2011-11-24 | Discharge: 2011-11-24 | Disposition: A | Discharge status: Home / Self Care | Payer: Medicare Other | Source: Ambulatory Visit | Attending: Interventional Radiology | Admitting: Interventional Radiology

## 2011-11-24 DIAGNOSIS — T82598A Other mechanical complication of other cardiac and vascular devices and implants, initial encounter: Secondary | ICD-10-CM | POA: Diagnosis not present

## 2011-11-24 DIAGNOSIS — C109 Malignant neoplasm of oropharynx, unspecified: Secondary | ICD-10-CM

## 2011-12-01 DIAGNOSIS — Z7901 Long term (current) use of anticoagulants: Secondary | ICD-10-CM | POA: Diagnosis not present

## 2011-12-01 DIAGNOSIS — I82409 Acute embolism and thrombosis of unspecified deep veins of unspecified lower extremity: Secondary | ICD-10-CM | POA: Diagnosis not present

## 2011-12-02 ENCOUNTER — Ambulatory Visit: Payer: Medicare Other | Attending: Radiation Oncology

## 2011-12-02 DIAGNOSIS — R4789 Other speech disturbances: Secondary | ICD-10-CM | POA: Insufficient documentation

## 2011-12-02 DIAGNOSIS — IMO0001 Reserved for inherently not codable concepts without codable children: Secondary | ICD-10-CM | POA: Insufficient documentation

## 2011-12-02 DIAGNOSIS — C01 Malignant neoplasm of base of tongue: Secondary | ICD-10-CM | POA: Diagnosis not present

## 2011-12-05 ENCOUNTER — Encounter (INDEPENDENT_AMBULATORY_CARE_PROVIDER_SITE_OTHER): Payer: Medicare Other | Admitting: General Surgery

## 2011-12-05 DIAGNOSIS — Z7901 Long term (current) use of anticoagulants: Secondary | ICD-10-CM | POA: Diagnosis not present

## 2011-12-05 DIAGNOSIS — Z125 Encounter for screening for malignant neoplasm of prostate: Secondary | ICD-10-CM | POA: Diagnosis not present

## 2011-12-05 DIAGNOSIS — E785 Hyperlipidemia, unspecified: Secondary | ICD-10-CM | POA: Diagnosis not present

## 2011-12-05 DIAGNOSIS — I2510 Atherosclerotic heart disease of native coronary artery without angina pectoris: Secondary | ICD-10-CM | POA: Diagnosis not present

## 2011-12-05 DIAGNOSIS — Z0000 Encounter for general adult medical examination without abnormal findings: Secondary | ICD-10-CM | POA: Diagnosis not present

## 2011-12-05 DIAGNOSIS — I517 Cardiomegaly: Secondary | ICD-10-CM | POA: Diagnosis not present

## 2011-12-09 ENCOUNTER — Encounter (INDEPENDENT_AMBULATORY_CARE_PROVIDER_SITE_OTHER): Payer: Medicare Other | Admitting: Internal Medicine

## 2011-12-09 ENCOUNTER — Telehealth (INDEPENDENT_AMBULATORY_CARE_PROVIDER_SITE_OTHER): Payer: Self-pay | Admitting: *Deleted

## 2011-12-09 NOTE — Telephone Encounter (Signed)
Patient could not be seen on time because first to patient's head complicated illness

## 2011-12-09 NOTE — Telephone Encounter (Signed)
[name] was scheduled to see Dr. Karilyn Cota at 11:30 am on 12/09/11.  At 11:50 am [name] came to the window to see how much longer it would be before being seen. Advised [name] that Dr. Karilyn Cota was finishing up with a patient and he had one more ahead of him. Per [name] "He will give Dr. Karilyn Cota 10 more minutes or he will need to reschedule. He planes his day accordingly and doesn't have time to wait". Advised [name] that Dr. Karilyn Cota gives each patient as much time that they need and the wait could be 15 to 60 more minutes. There is not a limit to time spent with patients. Told [name] that Dr. Patty Sermons next apt time was the end of April. [name] made the comment that he didn't have to reschedule or come back, he could go else where.  After speaking with my office manager, Lupita Leash offered [name] an apt for the afternoon at 3:00 pm. [name] declined and said it was too far for him to drive back and forth.  At 10 minutes after 12:00 pm, [name] came back to the window talking while I was on the phone. I,Chantelle Verdi, raised my hand and said wait one second. [name] said thanks anyway, then I spoke up and asked are you sure you would not like to wait. [name] said no that this was being disrespectful to him. Told [name], Dr. Karilyn Cota would give him the same amount of time then before I could finish talking, [name] said no that this was disrespectful. George Bradley, office manager, came to the window to let [name] know that Dr. Karilyn Cota. [name] stopped her saying I do not want to turn this into an argument. Reba said "I am not, I wanted to explain that Dr. Karilyn Cota is with a patient that needs" Mr. Mcgue stopped her again saying "I don't have time for this, thanks" then patient left.

## 2011-12-11 NOTE — Progress Notes (Signed)
This encounter was created in error - please disregard.

## 2011-12-12 ENCOUNTER — Encounter (INDEPENDENT_AMBULATORY_CARE_PROVIDER_SITE_OTHER): Payer: Self-pay | Admitting: *Deleted

## 2011-12-12 DIAGNOSIS — Z0000 Encounter for general adult medical examination without abnormal findings: Secondary | ICD-10-CM | POA: Diagnosis not present

## 2011-12-12 DIAGNOSIS — G589 Mononeuropathy, unspecified: Secondary | ICD-10-CM | POA: Diagnosis not present

## 2011-12-12 DIAGNOSIS — G4700 Insomnia, unspecified: Secondary | ICD-10-CM | POA: Diagnosis not present

## 2011-12-12 DIAGNOSIS — Z125 Encounter for screening for malignant neoplasm of prostate: Secondary | ICD-10-CM | POA: Diagnosis not present

## 2011-12-12 DIAGNOSIS — Z7901 Long term (current) use of anticoagulants: Secondary | ICD-10-CM | POA: Diagnosis not present

## 2011-12-12 DIAGNOSIS — I82409 Acute embolism and thrombosis of unspecified deep veins of unspecified lower extremity: Secondary | ICD-10-CM | POA: Diagnosis not present

## 2011-12-18 ENCOUNTER — Telehealth: Payer: Self-pay

## 2011-12-18 NOTE — Telephone Encounter (Signed)
MS. Roen CALLED STATING THAT THERE IS MORE PROMINENT LYMPHEDEMA IN HER HUSBAND'S ANTERIOR NECK.  THE SPEECH THERAPIST THOUGHT THAT MR. Hollyfield MIGHT BENEFIT FROM SOME PT/LYMPHEDEMA THERAPY. WOULD DR. HA AGREE WITH THIS?  IF YES, COULD HE SET UP REFERRAL? MS. Burgeson'S PAGER # [phone].  HER HUSBAND IS AT HOME NUMBER [phone].

## 2011-12-19 NOTE — Progress Notes (Signed)
Wife called today concerned about rehab referral to lymphedema clinic; assured patient that referral was faxed today; told patient that he should call us if he does not here from rehab center by next week; verbalized understanding.

## 2011-12-26 DIAGNOSIS — I82409 Acute embolism and thrombosis of unspecified deep veins of unspecified lower extremity: Secondary | ICD-10-CM | POA: Diagnosis not present

## 2011-12-26 DIAGNOSIS — Z7901 Long term (current) use of anticoagulants: Secondary | ICD-10-CM | POA: Diagnosis not present

## 2011-12-29 ENCOUNTER — Ambulatory Visit: Payer: Medicare Other | Attending: Oncology

## 2011-12-29 DIAGNOSIS — R4789 Other speech disturbances: Secondary | ICD-10-CM | POA: Insufficient documentation

## 2011-12-29 DIAGNOSIS — IMO0001 Reserved for inherently not codable concepts without codable children: Secondary | ICD-10-CM | POA: Diagnosis not present

## 2011-12-29 DIAGNOSIS — R070 Pain in throat: Secondary | ICD-10-CM | POA: Insufficient documentation

## 2011-12-29 DIAGNOSIS — C029 Malignant neoplasm of tongue, unspecified: Secondary | ICD-10-CM | POA: Diagnosis not present

## 2011-12-31 ENCOUNTER — Ambulatory Visit: Payer: Medicare Other | Attending: Oncology | Admitting: Physical Therapy

## 2011-12-31 DIAGNOSIS — I890 Lymphedema, not elsewhere classified: Secondary | ICD-10-CM | POA: Insufficient documentation

## 2011-12-31 DIAGNOSIS — IMO0001 Reserved for inherently not codable concepts without codable children: Secondary | ICD-10-CM | POA: Diagnosis not present

## 2012-01-05 ENCOUNTER — Ambulatory Visit: Payer: Medicare Other

## 2012-01-05 DIAGNOSIS — I890 Lymphedema, not elsewhere classified: Secondary | ICD-10-CM | POA: Diagnosis not present

## 2012-01-05 DIAGNOSIS — IMO0001 Reserved for inherently not codable concepts without codable children: Secondary | ICD-10-CM | POA: Diagnosis not present

## 2012-01-06 DIAGNOSIS — Z87898 Personal history of other specified conditions: Secondary | ICD-10-CM | POA: Diagnosis not present

## 2012-01-07 ENCOUNTER — Ambulatory Visit: Payer: Medicare Other

## 2012-01-07 ENCOUNTER — Encounter (INDEPENDENT_AMBULATORY_CARE_PROVIDER_SITE_OTHER): Payer: Self-pay

## 2012-01-07 DIAGNOSIS — IMO0001 Reserved for inherently not codable concepts without codable children: Secondary | ICD-10-CM | POA: Diagnosis not present

## 2012-01-07 DIAGNOSIS — I890 Lymphedema, not elsewhere classified: Secondary | ICD-10-CM | POA: Diagnosis not present

## 2012-01-09 ENCOUNTER — Ambulatory Visit: Payer: Self-pay | Admitting: Physical Therapy

## 2012-01-12 ENCOUNTER — Ambulatory Visit: Payer: Medicare Other

## 2012-01-12 DIAGNOSIS — IMO0001 Reserved for inherently not codable concepts without codable children: Secondary | ICD-10-CM | POA: Diagnosis not present

## 2012-01-12 DIAGNOSIS — I890 Lymphedema, not elsewhere classified: Secondary | ICD-10-CM | POA: Diagnosis not present

## 2012-01-14 ENCOUNTER — Ambulatory Visit: Payer: Medicare Other

## 2012-01-14 DIAGNOSIS — I890 Lymphedema, not elsewhere classified: Secondary | ICD-10-CM | POA: Diagnosis not present

## 2012-01-14 DIAGNOSIS — IMO0001 Reserved for inherently not codable concepts without codable children: Secondary | ICD-10-CM | POA: Diagnosis not present

## 2012-01-19 ENCOUNTER — Ambulatory Visit: Payer: Medicare Other | Admitting: Physical Therapy

## 2012-01-19 DIAGNOSIS — IMO0001 Reserved for inherently not codable concepts without codable children: Secondary | ICD-10-CM | POA: Diagnosis not present

## 2012-01-19 DIAGNOSIS — I890 Lymphedema, not elsewhere classified: Secondary | ICD-10-CM | POA: Diagnosis not present

## 2012-01-21 ENCOUNTER — Encounter: Payer: Self-pay | Admitting: Physical Therapy

## 2012-01-21 DIAGNOSIS — Z7901 Long term (current) use of anticoagulants: Secondary | ICD-10-CM | POA: Diagnosis not present

## 2012-01-21 DIAGNOSIS — I82409 Acute embolism and thrombosis of unspecified deep veins of unspecified lower extremity: Secondary | ICD-10-CM | POA: Diagnosis not present

## 2012-01-22 ENCOUNTER — Ambulatory Visit: Payer: Medicare Other | Attending: Oncology

## 2012-01-22 DIAGNOSIS — I890 Lymphedema, not elsewhere classified: Secondary | ICD-10-CM | POA: Insufficient documentation

## 2012-01-22 DIAGNOSIS — IMO0001 Reserved for inherently not codable concepts without codable children: Secondary | ICD-10-CM | POA: Insufficient documentation

## 2012-01-26 ENCOUNTER — Ambulatory Visit: Payer: Medicare Other | Admitting: Physical Therapy

## 2012-01-28 ENCOUNTER — Ambulatory Visit: Payer: Medicare Other

## 2012-02-02 ENCOUNTER — Encounter: Payer: Self-pay | Admitting: Physical Therapy

## 2012-02-04 ENCOUNTER — Encounter: Payer: Self-pay | Admitting: Physical Therapy

## 2012-02-25 DIAGNOSIS — I82409 Acute embolism and thrombosis of unspecified deep veins of unspecified lower extremity: Secondary | ICD-10-CM | POA: Diagnosis not present

## 2012-02-25 DIAGNOSIS — Z7901 Long term (current) use of anticoagulants: Secondary | ICD-10-CM | POA: Diagnosis not present

## 2012-03-08 ENCOUNTER — Ambulatory Visit
Admission: RE | Admit: 2012-03-08 | Discharge: 2012-03-08 | Disposition: A | Discharge status: Home / Self Care | Payer: Medicare Other | Source: Ambulatory Visit | Attending: Radiation Oncology | Admitting: Radiation Oncology

## 2012-03-08 ENCOUNTER — Encounter: Payer: Self-pay | Admitting: Radiation Oncology

## 2012-03-08 VITALS — BP 123/73 | HR 70 | Temp 98.5°F | Resp 20 | Wt 176.7 lb

## 2012-03-08 DIAGNOSIS — C01 Malignant neoplasm of base of tongue: Secondary | ICD-10-CM

## 2012-03-08 NOTE — Progress Notes (Signed)
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

## 2012-03-08 NOTE — Progress Notes (Signed)
  Radiation Oncology         [phone] ________________________________  Name: George Bradley MRN: [id number]  Date: 03/08/2012  DOB: [birth year]  Follow-Up Visit Note  CC: Gwen Pounds, MD  Exie Parody, MD  Diagnosis:   Stage IV base of tongue carcinoma  Interval Since Last Radiation:   8  months  Narrative:  The patient returns today for routine follow-up.  He seems to be making good progress. Patient has mild odynophagia but no other real issues at this time except for continued poor taste. This is slowly improving as well as his saliva production.                              ALLERGIES:   has no known allergies.  Meds: Current Outpatient Prescriptions  Medication Sig Dispense Refill  . aspirin EC 81 MG tablet Take 81 mg by mouth at bedtime.       . docusate-casanthranol (PERICOLACE) 100-30 MG per capsule Take 1 capsule by mouth daily as needed. Constipation      . LORazepam (ATIVAN) 1 MG tablet Take 1 tablet (1 mg total) by mouth every 8 (eight) hours as needed for anxiety.  90 tablet  1  . pantoprazole (PROTONIX) 40 MG tablet Take 1 tablet (40 mg total) by mouth 2 (two) times daily before a meal.  60 tablet  5  . polyethylene glycol (MIRALAX / GLYCOLAX) packet Take 17 g by mouth as needed.      . rosuvastatin (CRESTOR) 20 MG tablet Take 1 tablet (20 mg total) by mouth at bedtime.  90 tablet  2  . escitalopram (LEXAPRO) 10 MG tablet Take 10 mg by mouth at bedtime.      Marland Kitchen HYDROcodone-acetaminophen (VICODIN) 5-500 MG per tablet Take 0.5-1 tablets by mouth every 4 (four) hours as needed. Pain       . warfarin (COUMADIN) 2 MG tablet Take 2 mg by mouth as directed.       . zolpidem (AMBIEN) 10 MG tablet Take 5-10 mg by mouth at bedtime as needed. For sleep.        Physical Findings: The patient is in no acute distress. Patient is alert and oriented.  weight is 176 lb 11.2 oz (80.151 kg). His oral temperature is 98.5 F (36.9 C). His blood pressure is 123/73 and his pulse is 70. His  respiration is 20. .   The lungs are clear. The heart has a regular rhythm and rate. There is no palpable axillary supraclavicular or cervical adenopathy. The oral cavity shows some xerostomia. There are no mucosal lesions noted. Patient proceeded to undergo indirect mirror examination. There were no lesions noted along the base of, tongue or vallecula region. Direct palpation along the base of tongue and pharyngeal areas revealed no suspicious induration.  Lab Findings: Lab Results  Component Value Date   WBC 3.7* 11/11/2011   HGB 13.3 11/11/2011   HCT 38.7* 11/11/2011   MCV 95.1 11/11/2011   PLT 141* 11/11/2011    @LASTCHEM @  Radiographic Findings: No results found.  Impression:  The patient is recovering from the effects of radiation.  No evidence of recurrent disease.    Plan:  Routine followup in 3 months.  _____________________________________  Billie Lade, PhD, MD

## 2012-03-15 ENCOUNTER — Ambulatory Visit: Payer: Medicare Other | Admitting: Radiation Oncology

## 2012-03-31 ENCOUNTER — Ambulatory Visit (HOSPITAL_COMMUNITY): Payer: Medicare Other

## 2012-03-31 ENCOUNTER — Ambulatory Visit (HOSPITAL_COMMUNITY)
Admission: RE | Admit: 2012-03-31 | Discharge: 2012-03-31 | Disposition: A | Discharge status: Home / Self Care | Payer: Medicare Other | Source: Ambulatory Visit | Attending: Oncology | Admitting: Oncology

## 2012-03-31 ENCOUNTER — Other Ambulatory Visit (HOSPITAL_BASED_OUTPATIENT_CLINIC_OR_DEPARTMENT_OTHER): Payer: Medicare Other | Admitting: Lab

## 2012-03-31 DIAGNOSIS — Z7901 Long term (current) use of anticoagulants: Secondary | ICD-10-CM | POA: Diagnosis not present

## 2012-03-31 DIAGNOSIS — I8290 Acute embolism and thrombosis of unspecified vein: Secondary | ICD-10-CM

## 2012-03-31 DIAGNOSIS — E039 Hypothyroidism, unspecified: Secondary | ICD-10-CM

## 2012-03-31 DIAGNOSIS — K55059 Acute (reversible) ischemia of intestine, part and extent unspecified: Secondary | ICD-10-CM | POA: Diagnosis not present

## 2012-03-31 DIAGNOSIS — R109 Unspecified abdominal pain: Secondary | ICD-10-CM

## 2012-03-31 DIAGNOSIS — I749 Embolism and thrombosis of unspecified artery: Secondary | ICD-10-CM | POA: Insufficient documentation

## 2012-03-31 DIAGNOSIS — C01 Malignant neoplasm of base of tongue: Secondary | ICD-10-CM | POA: Diagnosis not present

## 2012-03-31 DIAGNOSIS — R1310 Dysphagia, unspecified: Secondary | ICD-10-CM | POA: Insufficient documentation

## 2012-03-31 DIAGNOSIS — C109 Malignant neoplasm of oropharynx, unspecified: Secondary | ICD-10-CM | POA: Diagnosis not present

## 2012-03-31 DIAGNOSIS — Z5189 Encounter for other specified aftercare: Secondary | ICD-10-CM | POA: Diagnosis not present

## 2012-03-31 DIAGNOSIS — R599 Enlarged lymph nodes, unspecified: Secondary | ICD-10-CM | POA: Diagnosis not present

## 2012-03-31 LAB — CMP (CANCER CENTER ONLY)
AST: 30 U/L (ref 11–38)
Albumin: 4 g/dL (ref 3.3–5.5)
BUN, Bld: 19 mg/dL (ref 7–22)
Calcium: 9.4 mg/dL (ref 8.0–10.3)
Chloride: 100 mEq/L (ref 98–108)
Potassium: 4.5 mEq/L (ref 3.3–4.7)
Total Protein: 7.5 g/dL (ref 6.4–8.1)

## 2012-03-31 MED ORDER — IOHEXOL 300 MG/ML  SOLN: 100.0000 mL | Freq: Once | INTRAMUSCULAR | Status: AC | PRN

## 2012-04-13 DIAGNOSIS — G4700 Insomnia, unspecified: Secondary | ICD-10-CM | POA: Diagnosis not present

## 2012-04-13 DIAGNOSIS — G589 Mononeuropathy, unspecified: Secondary | ICD-10-CM | POA: Diagnosis not present

## 2012-04-13 DIAGNOSIS — N401 Enlarged prostate with lower urinary tract symptoms: Secondary | ICD-10-CM | POA: Diagnosis not present

## 2012-04-19 DIAGNOSIS — L570 Actinic keratosis: Secondary | ICD-10-CM | POA: Diagnosis not present

## 2012-04-19 DIAGNOSIS — D485 Neoplasm of uncertain behavior of skin: Secondary | ICD-10-CM | POA: Diagnosis not present

## 2012-04-28 ENCOUNTER — Telehealth (INDEPENDENT_AMBULATORY_CARE_PROVIDER_SITE_OTHER): Payer: Self-pay | Admitting: *Deleted

## 2012-04-28 ENCOUNTER — Other Ambulatory Visit (INDEPENDENT_AMBULATORY_CARE_PROVIDER_SITE_OTHER): Payer: Self-pay | Admitting: *Deleted

## 2012-04-28 DIAGNOSIS — R1310 Dysphagia, unspecified: Secondary | ICD-10-CM

## 2012-04-28 NOTE — Telephone Encounter (Signed)
PCP: Timothy Lasso  Name & DOB: George Bradley 12/28/2040     Procedure: egd/ed      sch'd per NUR    Reason/Indication:  dysphagia  Has patient had this procedure before?  no  If so, when, by whom and where?    Is there a family history of colon cancer?  no  Who?  What age when diagnosed?    Is patient diabetic?   no      Does patient have prosthetic heart valve?  no  Do you have a pacemaker?  no  Has patient had joint replacement within last 12 months?  no  Is patient on Coumadin, Plavix and/or Aspirin? yes  Medications: asa 81 mg daily, gabapentin 100 mg bid, rapaflo 4 mg daily, magnesium 400 mg daily, crestor 20 mg daily, zolpidem 10 mg 1/2 tab prn, pantoprazole 40 mg bid  Allergies: nkda  Medication Adjustment: asa 2 days  Procedure date & time: 05/03/12 at 730

## 2012-04-28 NOTE — Telephone Encounter (Signed)
George Bradley called back to say that  Her husband was caught off guard when he was called this morning. They have talked and he feels that he would like to precede with the EGD/Ed verses coming in for the appointment. She states that he is available anytime. They may be contacted at [phone].

## 2012-04-28 NOTE — Telephone Encounter (Signed)
agree

## 2012-04-28 NOTE — Telephone Encounter (Signed)
George Bradley patient's wife called on 04/27/12. She left a message asking for an appointment as soon as possible with Dr.[name]. George Bradley is having increasing difficulty (trouble) swallowing his food. He has had treatment for cancer and this has caused trouble with his throat. On 04/28/12 I called and spoke with George Bradley, he would like to come in and see Dr.[name] and see if a EGD/Ed is needed as he was told that the tube would correct its self. Patient will be given an appoinment.

## 2012-04-28 NOTE — Telephone Encounter (Signed)
EGD/ED sch'd 05/03/12, Mr Zeiders aware, verbal instr. given

## 2012-04-29 ENCOUNTER — Other Ambulatory Visit: Payer: Self-pay | Admitting: Lab

## 2012-04-29 ENCOUNTER — Encounter (HOSPITAL_COMMUNITY): Payer: Self-pay | Admitting: Pharmacy Technician

## 2012-05-03 ENCOUNTER — Other Ambulatory Visit (INDEPENDENT_AMBULATORY_CARE_PROVIDER_SITE_OTHER): Payer: Self-pay | Admitting: *Deleted

## 2012-05-03 ENCOUNTER — Ambulatory Visit (HOSPITAL_COMMUNITY)
Admission: RE | Admit: 2012-05-03 | Discharge: 2012-05-03 | Disposition: A | Discharge status: Home / Self Care | Payer: Medicare Other | Source: Ambulatory Visit | Attending: Internal Medicine | Admitting: Internal Medicine

## 2012-05-03 ENCOUNTER — Encounter (HOSPITAL_COMMUNITY): Payer: Self-pay | Admitting: *Deleted

## 2012-05-03 ENCOUNTER — Encounter (HOSPITAL_COMMUNITY)
Admission: RE | Disposition: A | Discharge status: Home / Self Care | Payer: Self-pay | Source: Ambulatory Visit | Attending: Internal Medicine

## 2012-05-03 DIAGNOSIS — R1310 Dysphagia, unspecified: Secondary | ICD-10-CM

## 2012-05-03 DIAGNOSIS — K222 Esophageal obstruction: Secondary | ICD-10-CM | POA: Diagnosis not present

## 2012-05-03 DIAGNOSIS — Z85819 Personal history of malignant neoplasm of unspecified site of lip, oral cavity, and pharynx: Secondary | ICD-10-CM | POA: Insufficient documentation

## 2012-05-03 DIAGNOSIS — E785 Hyperlipidemia, unspecified: Secondary | ICD-10-CM | POA: Diagnosis not present

## 2012-05-03 SURGERY — ESOPHAGOGASTRODUODENOSCOPY (EGD) WITH ESOPHAGEAL DILATION
Anesthesia: Moderate Sedation

## 2012-05-03 MED ORDER — SODIUM CHLORIDE 0.45 % IV SOLN
INTRAVENOUS | Status: DC
  Administered 2012-05-03: 20 mL/h via INTRAVENOUS

## 2012-05-03 MED ORDER — EPINEPHRINE HCL 1 MG/ML IJ SOLN
INTRAMUSCULAR | Status: AC
  Filled 2012-05-03: qty 1

## 2012-05-03 MED ORDER — MIDAZOLAM HCL 5 MG/5ML IJ SOLN
INTRAMUSCULAR | Status: DC | PRN
  Administered 2012-05-03 (×2): 2 mg via INTRAVENOUS
  Administered 2012-05-03 (×2): 3 mg via INTRAVENOUS

## 2012-05-03 MED ORDER — MIDAZOLAM HCL 5 MG/5ML IJ SOLN
INTRAMUSCULAR | Status: AC
  Filled 2012-05-03: qty 10

## 2012-05-03 MED ORDER — MEPERIDINE HCL 50 MG/ML IJ SOLN
INTRAMUSCULAR | Status: AC
  Filled 2012-05-03: qty 1

## 2012-05-03 MED ORDER — SODIUM CHLORIDE 0.9 % IV SOLN: INTRAVENOUS | Status: DC

## 2012-05-03 MED ORDER — BUTAMBEN-TETRACAINE-BENZOCAINE 2-2-14 % EX AERO
INHALATION_SPRAY | CUTANEOUS | Status: DC | PRN
  Administered 2012-05-03: 2 via TOPICAL

## 2012-05-03 MED ORDER — MEPERIDINE HCL 25 MG/ML IJ SOLN
INTRAMUSCULAR | Status: DC | PRN
  Administered 2012-05-03 (×2): 25 mg via INTRAVENOUS

## 2012-05-03 MED ORDER — STERILE WATER FOR IRRIGATION IR SOLN
Status: DC | PRN
  Administered 2012-05-03: 07:00:00

## 2012-05-03 MED ORDER — LORAZEPAM 1 MG PO TABS: 1.0000 mg | ORAL_TABLET | Freq: Three times a day (TID) | ORAL | Status: DC | PRN

## 2012-05-03 NOTE — Op Note (Signed)
EGD PROCEDURE REPORT  PATIENT:  George Bradley  MR#:  [id number] Birthdate:  [birth year], 71 y.o., male Endoscopist:  Dr. Malissa Hippo, MD Referred By:  Dr. Gwen Pounds, MD  Procedure Date: 05/03/2012  Procedure:   EGD with ED.  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 He now presents with progressive solid food dysphagia.           Informed Consent:  The risks, benefits, alternatives & imponderables which include, but are not limited to, bleeding, infection, perforation, drug reaction and potential missed lesion have been reviewed.  The potential for biopsy, lesion removal, esophageal dilation, etc. have also been discussed.  Questions have been answered.  All parties agreeable.  Please see history & physical in medical record for more information.  Medications:  Demerol 50 mg IV Versed 10 mg IV Cetacaine spray topically for oropharyngeal anesthesia  Description of procedure:  The endoscope was introduced through the mouth and advanced to the second portion of the duodenum without difficulty or limitations. The mucosal surfaces were surveyed very carefully during advancement of the scope and upon withdrawal.  Findings:  Esophagus:  Stricture noted to proximal 2-3 cm with pale mucosa she dilated by passing the endoscope. The close of the distal two thirds reveals linear furrows. GEJ:  42 cm Stomach:  Stomach was empty and distended very well with insufflation. Folds in the proximal stomach were normal. Examination of mucosa at body, antrum, pyloric channel, angularis, fundus and cardia was normal. Duodenum:  Normal bulbar and post bulbar mucosa.  Therapeutic/Diagnostic Maneuvers Performed:  Esophagus dilated by passing 46 French Maloney dilator to full insertion. Esophagus was examined post dilation and three superficial areas with mucosal  disruption noted proximal esophagus. Biopsy taken from esophageal body looking for eosinophilic esophagitis.  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 Stricture dilated to 1 [place] with [facility] dilator. Biopsy taken from esophageal body to rule out eosinophilic esophagitis.  Recommendations:  Soft foods for 48 hours. I will contact patient with biopsy results. Repeat dilation in  2-3 weeks.  Chicquita Mendel U  05/03/2012  8:21 AM  CC: Dr. Gwen Pounds, MD & Dr. Bonnetta Barry ref. provider found

## 2012-05-03 NOTE — H&P (Signed)
PARAM CAPRI is an 71 y.o. male.   Chief Complaint: Patient sent for EGD and ED. HPI: Patient is 71 year old Caucasian male with history of head and neck squamous cell carcinoma who was treated with chemoradiation last year and has remained in remission. Since that solid food dysphagia which has been gradually getting worse. He points to the suprasternal area side abortus obstruction. He's had multiple episodes to regurgitate food bolus to get relief. This elevation has decreased since radiation therapy and he drinks sips of liquids when he eats. He rarely experiences heartburn. He denies abdominal pain. His appetite is fair.  Pretreatment weight was 210 pounds and now he is maintaining at 170 pounds. He just had a PET scan which was negative.  Past Medical History  Diagnosis Date  . Ischemic heart disease   . Aortic stenosis, mild   . Hyperlipidemia   . Insomnia   . Fatigue   . Dizziness   . Hypotension     orthostatic  . History of echocardiogram 02/09/2009    EF -  55-60%  . History of cardiovascular stress test 03/14/2009    EF - 57%   /  mild anterior hyperkinesia with discrete anterior scar consistent with previous infarction but unchanged from previous studies.  Overall, stable findings -- Colleen Can. Deborah Chalk, MD  . Weakness   . ASCVD (arteriosclerotic cardiovascular disease)   . Arrhythmia     known history of  . Weight loss, abnormal 2012  . 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 Protein-calorie malnutrition, moderate 2012    on PEG tube feeding  . Cellulitis of artificial external opening, post-operative 2012    of PEG tube site  . Mesenteric thrombosis     Past Surgical History  Procedure Date  . 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 Cardiac catheterization 2007    EF - 55%  . Ablation of dysrhythmic focus   . Esophagogastroduodenoscopy 04/24/2011    Procedure: ESOPHAGOGASTRODUODENOSCOPY (EGD);  Surgeon: Malissa Hippo, MD;  [facility]: [facility];  Service:  Endoscopy;  Laterality: N/A;  8:30 am  . Peg placement 04/24/2011    Procedure: PERCUTANEOUS ENDOSCOPIC GASTROSTOMY (PEG) PLACEMENT;  Surgeon: Malissa Hippo, MD;  Location: [facility];  Service: Endoscopy;  Laterality: N/A;    Family History  Problem Relation Age of Onset  . Heart disease Father   . Heart disease Brother   . Heart disease Sister   . Cancer Sister     breast cancer  . Cancer Mother     breast ca  . Hyperlipidemia Son    Social History:  reports that he has quit smoking. His smoking use included Cigars. He quit smokeless tobacco use about a year ago. He reports that he does not drink alcohol or use illicit drugs.  Allergies: No Known Allergies  Medications Prior to Admission  Medication Sig Dispense Refill  . aspirin EC 81 MG tablet Take 81 mg by mouth at bedtime.       . gabapentin (NEURONTIN) 100 MG capsule Take 300 mg by mouth at bedtime.      Marland Kitchen LORazepam (ATIVAN) 1 MG tablet Take 1 mg by mouth every 8 (eight) hours. For anxiety      . magnesium oxide (MAG-OX) 400 MG tablet Take 400 mg by mouth every evening.      . pantoprazole (PROTONIX) 40 MG tablet Take 40 mg by mouth 2 (two) times daily before a meal.      .  rosuvastatin (CRESTOR) 20 MG tablet Take 1 tablet (20 mg total) by mouth at bedtime.  90 tablet  2  . silodosin (RAPAFLO) 4 MG CAPS capsule Take 4 mg by mouth every evening.      . zolpidem (AMBIEN) 10 MG tablet Take 5 mg by mouth at bedtime as needed. For sleep.        No results found for this or any previous visit (from the past 48 hour(s)). No results found.  ROS  Blood pressure 112/70, pulse 49, temperature 97.5 F (36.4 C), temperature source Oral, resp. rate 20, height 6\' 1"  (1.854 m), weight 172 lb (78.019 kg), SpO2 98.00%. Physical Exam  Constitutional: He appears well-developed and well-nourished.  HENT:  Mouth/Throat: Oropharynx is clear and moist.  Eyes: Conjunctivae are normal. No scleral icterus.  Neck: No thyromegaly present.    Cardiovascular: Normal rate, regular rhythm and normal heart sounds.   No murmur heard. Respiratory: Effort normal and breath sounds normal.  GI: Soft. He exhibits no distension and no mass. There is no tenderness.  Musculoskeletal: He exhibits no edema.  Lymphadenopathy:    He has no cervical adenopathy.  Neurological: He is alert.  Skin: Skin is dry.     Assessment/Plan Solid food dysphagia. History of head and neck squamous cell carcinoma; status post chemotherapy and radiation last year and remains in remission. EGD and possible ED.  [name],[name] 05/03/2012, 7:53 AM

## 2012-05-06 ENCOUNTER — Encounter (HOSPITAL_COMMUNITY): Payer: Self-pay | Admitting: Pharmacy Technician

## 2012-05-06 ENCOUNTER — Encounter (INDEPENDENT_AMBULATORY_CARE_PROVIDER_SITE_OTHER): Payer: Self-pay | Admitting: *Deleted

## 2012-05-06 ENCOUNTER — Other Ambulatory Visit (INDEPENDENT_AMBULATORY_CARE_PROVIDER_SITE_OTHER): Payer: Self-pay | Admitting: *Deleted

## 2012-05-06 DIAGNOSIS — R1310 Dysphagia, unspecified: Secondary | ICD-10-CM

## 2012-05-11 ENCOUNTER — Encounter: Payer: Self-pay | Admitting: Cardiology

## 2012-05-11 ENCOUNTER — Ambulatory Visit (INDEPENDENT_AMBULATORY_CARE_PROVIDER_SITE_OTHER): Payer: Medicare Other | Admitting: Cardiology

## 2012-05-11 VITALS — BP 112/64 | HR 72 | Ht 73.0 in | Wt 174.8 lb

## 2012-05-11 DIAGNOSIS — I2510 Atherosclerotic heart disease of native coronary artery without angina pectoris: Secondary | ICD-10-CM | POA: Diagnosis not present

## 2012-05-11 DIAGNOSIS — I951 Orthostatic hypotension: Secondary | ICD-10-CM | POA: Insufficient documentation

## 2012-05-11 DIAGNOSIS — R1013 Epigastric pain: Secondary | ICD-10-CM | POA: Diagnosis not present

## 2012-05-11 MED ORDER — ROSUVASTATIN CALCIUM 20 MG PO TABS: 20.0000 mg | ORAL_TABLET | Freq: Every day | ORAL | Status: DC

## 2012-05-11 NOTE — Progress Notes (Signed)
Subjective:   George Bradley comes in today for followup visit.  He had coronary artery bypass grafting in 2000. His last catheterization was in 2007 and showed patent saphenous vein grafts x3 with a patent left internal mammary artery and well-preserved LV function. His EF is 55%. His last nuclear stress test in July of 2012 showed a small fixed apical defect with normal ejection fraction. He has a history of orthostatic hypotension and some lightheadedness on occasion. He is on no antihypertensive therapy. He's had a history of what sounds like a Maze procedure when he lived in [place]. His other problems include hyperlipidemia, insomnia, and a history of depression. George Bradley has had a  difficult prior year. 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 He has undergone chemotherapy and extensive radiation therapy. He underwent surgical placement of a gastrostomy tube. 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 Subsequent CT scan showed resolution of the thrombosis. He has since had his gastrostomy and Port-A-Cath  removed. He has had recent endoscopy with dilatation of a stricture. His primary concerns today are his persistent lightheadedness with standing. He also had developed fairly excruciating epigastric pain after eating. He is concerned that this may be related to blockage. His symptoms have improved dramatically with the use of Ativan.  Current Outpatient Prescriptions  Medication Sig Dispense Refill  . aspirin EC 81 MG tablet Take 81 mg by mouth at bedtime.       . gabapentin (NEURONTIN) 100 MG capsule Take 300 mg by mouth at bedtime.      Marland Kitchen LORazepam (ATIVAN) 1 MG tablet Take 1 mg by mouth every 8 (eight) hours. For anxiety      . magnesium oxide (MAG-OX) 400 MG tablet Take 400 mg by mouth every evening.      . pantoprazole (PROTONIX) 40 MG tablet Take 40 mg by mouth 2 (two) times daily before a meal.      .  rosuvastatin (CRESTOR) 20 MG tablet Take 1 tablet (20 mg total) by mouth at bedtime.  90 tablet  3  . silodosin (RAPAFLO) 4 MG CAPS capsule Take 4 mg by mouth every evening.      . zolpidem (AMBIEN) 10 MG tablet Take 5 mg by mouth at bedtime as needed. For sleep.      Marland Kitchen DISCONTD: rosuvastatin (CRESTOR) 20 MG tablet Take 1 tablet (20 mg total) by mouth at bedtime.  90 tablet  2    No Known Allergies  Patient Active Problem List  Diagnosis  . Ischemic heart disease  . Hypercholesterolemia  . Aortic stenosis  . Protein-calorie malnutrition, moderate  . Oropharyngeal cancer  . Weight loss, abnormal  . Renal artery stenosis, native, bilateral  . Abdominal pain, acute, periumbilical  . Thrombosis of mesenteric vein  . Depression  . Cancer of base of tongue    History  Smoking status  . Former Smoker  . Types: Cigars  Smokeless tobacco  . Former Neurosurgeon  . Quit date: 05/06/2011  Comment: 2 cigars a week    History  Alcohol Use No    occasionally glass of wine    Family History  Problem Relation Age of Onset  . Heart disease Father   . Heart disease Brother   . Heart disease Sister   . Cancer Sister     breast cancer  . Cancer Mother     breast ca  . Hyperlipidemia Son  Review of Systems:   Review of systems is positive for weight loss. He complains of weakness. He has a chronically dry mouth. He feels that he is eating fairly well.  All other systems were reviewed and are negative.   Physical Exam:    BP 112/64  Pulse 72  Ht 6\' 1"  (1.854 m)  Wt 79.289 kg (174 lb 12.8 oz)  BMI 23.06 kg/m2 orthostatic blood pressure check reveals a blood pressure of 138/80 supine, 120/80 sitting, 118/70 standing.  The head is normocephalic and atraumatic.  Pupils are equally round and reactive to light.  Sclerae nonicteric.  Conjunctiva is clear.  Oropharynx is clear.   Neck is supple there are no masses.  Thyroid is not enlarged.  There is no lymphadenopathy.  Lungs are clear.   Chest is symmetric.  Heart shows a regular rate and rhythm.  S1 and S2 are normal.  There is a soft systolic outflow murmur.  Abdomen is soft normal bowel sounds. He has a bandage over his gastrostomy site. There is no organomegaly.   Extremities are without edema.  Peripheral pulses are adequate.  Neurologically intact.  Skin is warm and dry.  Laboratory data:   Assessment / Plan:  1. Orthostatic hypotension. This has been a chronic issue. The only medication he is taking now which may exacerbate this is his Rapaflo. He states he hasn't really gotten any benefit from this medication and is going to stop it. He has had a previous echocardiogram that was unremarkable. I recommended good hydration and liberalization of sodium in his diet. At this point his symptoms are not severe enough to warrant additional medical therapy such as midodrine or Florinef.  2. Epigastric pain. Previous CT scan showed resolution of his mesenteric thrombosis. There was no evidence of significant celiac or mesenteric artery stenoses by CT. Given his response to Ativan I would continue with this.  3. Coronary disease status post CABG. Clinically asymptomatic. Continue risk factor modification.  4. 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

## 2012-05-11 NOTE — Patient Instructions (Addendum)
Consider stopping Rapaflo.  Liberalize sodium in your diet and stay well hydrated   I will see you again in 6 months

## 2012-05-17 ENCOUNTER — Other Ambulatory Visit (HOSPITAL_BASED_OUTPATIENT_CLINIC_OR_DEPARTMENT_OTHER): Payer: Medicare Other | Admitting: Lab

## 2012-05-17 DIAGNOSIS — C109 Malignant neoplasm of oropharynx, unspecified: Secondary | ICD-10-CM

## 2012-05-17 DIAGNOSIS — I8290 Acute embolism and thrombosis of unspecified vein: Secondary | ICD-10-CM

## 2012-05-17 DIAGNOSIS — R109 Unspecified abdominal pain: Secondary | ICD-10-CM | POA: Diagnosis not present

## 2012-05-17 DIAGNOSIS — K55059 Acute (reversible) ischemia of intestine, part and extent unspecified: Secondary | ICD-10-CM | POA: Diagnosis not present

## 2012-05-17 DIAGNOSIS — E039 Hypothyroidism, unspecified: Secondary | ICD-10-CM | POA: Diagnosis not present

## 2012-05-17 LAB — CBC WITH DIFFERENTIAL/PLATELET
BASO%: 0.5 % (ref 0.0–2.0)
Eosinophils Absolute: 0.1 10*3/uL (ref 0.0–0.5)
LYMPH%: 17.6 % (ref 14.0–49.0)
MCHC: 33.9 g/dL (ref 32.0–36.0)
MCV: 97 fL (ref 79.3–98.0)
MONO#: 0.6 10*3/uL (ref 0.1–0.9)
MONO%: 11.6 % (ref 0.0–14.0)
NEUT#: 3.4 10*3/uL (ref 1.5–6.5)
RBC: 4.1 10*6/uL — ABNORMAL LOW (ref 4.20–5.82)
RDW: 14.3 % (ref 11.0–14.6)
WBC: 4.9 10*3/uL (ref 4.0–10.3)

## 2012-05-20 LAB — HYPERCOAGULABLE PANEL, COMPREHENSIVE
AntiThromb III Func: 99 % (ref 76–126)
Anticardiolipin IgM: 3 MPL U/mL (ref <11)
Beta-2 Glyco I IgG: 0 G Units (ref <20)
Beta-2-Glycoprotein I IgM: 6 M Units (ref <20)
DRVVT: 34.3 secs (ref <45.1)
Lupus Anticoagulant: NOT DETECTED
PTT Lupus Anticoagulant: 31.4 secs (ref 28.0–43.0)
Protein C Activity: 92 % (ref 75–133)
Protein S Activity: 81 % (ref 69–129)

## 2012-05-20 LAB — TSH: TSH: 5.118 u[IU]/mL — ABNORMAL HIGH (ref 0.350–4.500)

## 2012-05-21 ENCOUNTER — Encounter (HOSPITAL_COMMUNITY)
Admission: RE | Disposition: A | Discharge status: Home / Self Care | Payer: Self-pay | Source: Ambulatory Visit | Attending: Internal Medicine

## 2012-05-21 ENCOUNTER — Encounter (HOSPITAL_COMMUNITY): Payer: Self-pay

## 2012-05-21 ENCOUNTER — Ambulatory Visit (HOSPITAL_COMMUNITY): Admission: RE | Admit: 2012-05-21 | Payer: Medicare Other | Source: Ambulatory Visit | Admitting: Internal Medicine

## 2012-05-21 ENCOUNTER — Encounter (HOSPITAL_COMMUNITY): Admission: RE | Payer: Self-pay | Source: Ambulatory Visit

## 2012-05-21 ENCOUNTER — Ambulatory Visit (HOSPITAL_COMMUNITY)
Admission: RE | Admit: 2012-05-21 | Discharge: 2012-05-21 | Disposition: A | Discharge status: Home / Self Care | Payer: Medicare Other | Source: Ambulatory Visit | Attending: Internal Medicine | Admitting: Internal Medicine

## 2012-05-21 DIAGNOSIS — K222 Esophageal obstruction: Secondary | ICD-10-CM | POA: Insufficient documentation

## 2012-05-21 DIAGNOSIS — Z9221 Personal history of antineoplastic chemotherapy: Secondary | ICD-10-CM | POA: Insufficient documentation

## 2012-05-21 DIAGNOSIS — R1310 Dysphagia, unspecified: Secondary | ICD-10-CM | POA: Insufficient documentation

## 2012-05-21 DIAGNOSIS — C760 Malignant neoplasm of head, face and neck: Secondary | ICD-10-CM | POA: Diagnosis not present

## 2012-05-21 DIAGNOSIS — Z923 Personal history of irradiation: Secondary | ICD-10-CM | POA: Insufficient documentation

## 2012-05-21 DIAGNOSIS — K2960 Other gastritis without bleeding: Secondary | ICD-10-CM | POA: Diagnosis not present

## 2012-05-21 DIAGNOSIS — E785 Hyperlipidemia, unspecified: Secondary | ICD-10-CM | POA: Insufficient documentation

## 2012-05-21 SURGERY — COLONOSCOPY
Anesthesia: Moderate Sedation

## 2012-05-21 SURGERY — ESOPHAGOGASTRODUODENOSCOPY (EGD) WITH ESOPHAGEAL DILATION
Anesthesia: Moderate Sedation

## 2012-05-21 MED ORDER — BUTAMBEN-TETRACAINE-BENZOCAINE 2-2-14 % EX AERO
INHALATION_SPRAY | CUTANEOUS | Status: DC | PRN
  Administered 2012-05-21: 2 via TOPICAL

## 2012-05-21 MED ORDER — STERILE WATER FOR IRRIGATION IR SOLN
Status: DC | PRN
  Administered 2012-05-21: 14:00:00

## 2012-05-21 MED ORDER — SODIUM CHLORIDE 0.45 % IV SOLN
INTRAVENOUS | Status: DC
  Administered 2012-05-21: 14:00:00 via INTRAVENOUS

## 2012-05-21 MED ORDER — MIDAZOLAM HCL 5 MG/5ML IJ SOLN
INTRAMUSCULAR | Status: DC | PRN
  Administered 2012-05-21 (×2): 2 mg via INTRAVENOUS
  Administered 2012-05-21 (×2): 3 mg via INTRAVENOUS

## 2012-05-21 MED ORDER — MIDAZOLAM HCL 5 MG/5ML IJ SOLN
INTRAMUSCULAR | Status: AC
  Filled 2012-05-21: qty 10

## 2012-05-21 MED ORDER — MEPERIDINE HCL 50 MG/ML IJ SOLN
INTRAMUSCULAR | Status: AC
  Filled 2012-05-21: qty 1

## 2012-05-21 MED ORDER — MEPERIDINE HCL 25 MG/ML IJ SOLN
INTRAMUSCULAR | Status: DC | PRN
  Administered 2012-05-21 (×2): 25 mg via INTRAVENOUS

## 2012-05-21 NOTE — H&P (Signed)
George Bradley is an 71 y.o. male.   Chief Complaint: Patient is here for EGD in ED. HPI: George Bradley is 71 year old Caucasian male with history of head and neck CA who was treated with chemoradiation and is in remission. He has developed esophageal stricture. He underwent esophageal dilation on 05/03/2012 when the stricture was dilated to 46 [place]. He noted significant improvement in his dysphagia slowly and steadily worse with she is still not back Lahey was prior to first dilation. His abdominal pain has improved great deal with lorazepam. He is here to undergo repeat dilation hopefully to 50 or 52 [place].  Past Medical History  Diagnosis Date  . Ischemic heart disease   . Aortic stenosis, mild   . Hyperlipidemia   . Insomnia   . Fatigue   . Dizziness   . Hypotension     orthostatic  . History of echocardiogram 02/09/2009    EF -  55-60%  . History of cardiovascular stress test 03/14/2009    EF - 57%   /  mild anterior hyperkinesia with discrete anterior scar consistent with previous infarction but unchanged from previous studies.  Overall, stable findings -- Colleen Can. Deborah Chalk, MD  . Weakness   . ASCVD (arteriosclerotic cardiovascular disease)   . Arrhythmia     known history of  . Weight loss, abnormal 2012  . 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 Protein-calorie malnutrition, moderate 2012    on PEG tube feeding  . Cellulitis of artificial external opening, post-operative 2012    of PEG tube site  . Mesenteric thrombosis     Past Surgical History  Procedure Date  . 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 Cardiac catheterization 2007    EF - 55%  . Ablation of dysrhythmic focus   . Esophagogastroduodenoscopy 04/24/2011    Procedure: ESOPHAGOGASTRODUODENOSCOPY (EGD);  Surgeon: Malissa Hippo, MD;  Location: [facility];  Service: Endoscopy;  Laterality: N/A;  8:30 am  . Peg placement 04/24/2011    Procedure: PERCUTANEOUS ENDOSCOPIC GASTROSTOMY (PEG) PLACEMENT;  Surgeon: Malissa Hippo, MD;  Location: [facility];  Service: Endoscopy;  Laterality: N/A;    Family History  Problem Relation Age of Onset  . Heart disease Father   . Heart disease Brother   . Heart disease Sister   . Cancer Sister     breast cancer  . Cancer Mother     breast ca  . Hyperlipidemia Son    Social History:  reports that he has quit smoking. His smoking use included Cigars. He quit smokeless tobacco use about 12 months ago. He reports that he does not drink alcohol or use illicit drugs.  Allergies: No Known Allergies  Medications Prior to Admission  Medication Sig Dispense Refill  . aspirin EC 81 MG tablet Take 81 mg by mouth at bedtime.       . gabapentin (NEURONTIN) 100 MG capsule Take 300 mg by mouth at bedtime.      Marland Kitchen LORazepam (ATIVAN) 1 MG tablet Take 1 mg by mouth every 8 (eight) hours. For anxiety      . magnesium oxide (MAG-OX) 400 MG tablet Take 400 mg by mouth every evening.      . pantoprazole (PROTONIX) 40 MG tablet Take 40 mg by mouth 2 (two) times daily before a meal.      . rosuvastatin (CRESTOR) 20 MG tablet Take 1 tablet (20 mg total) by mouth at bedtime.  90 tablet  3  .  zolpidem (AMBIEN) 10 MG tablet Take 5 mg by mouth at bedtime as needed. For sleep.      . silodosin (RAPAFLO) 4 MG CAPS capsule Take 4 mg by mouth every evening.        No results found for this or any previous visit (from the past 48 hour(s)). No results found.  ROS  Blood pressure 137/81, pulse 70, temperature 98.6 F (37 C), temperature source Oral, resp. rate 22. Physical Exam  Constitutional: He appears well-developed and well-nourished.  HENT:  Mouth/Throat: Oropharynx is clear and moist.  Eyes: Conjunctivae are normal. No scleral icterus.  Neck: No thyromegaly present.  Cardiovascular: Normal rate and regular rhythm.   Murmur:  grade 2/6 SEM at [facility] AA. Respiratory: Effort normal and breath sounds normal.  GI: Soft. He exhibits no distension and no mass. There is no  tenderness.  Musculoskeletal: He exhibits no edema.  Lymphadenopathy:    He has no cervical adenopathy.  Neurological: He is alert.     Assessment/Plan Dysphagia secondary to radiation induced proximal esophageal stricture. EGD with ED.  Cairo Lingenfelter U 05/21/2012, 2:12 PM

## 2012-05-21 NOTE — Op Note (Signed)
EGD PROCEDURE REPORT  PATIENT:  George Bradley  MR#:  [id number] Birthdate:  1940-09-24, 71 y.o., male Endoscopist:  Dr. Malissa Hippo, MD Referred By:  Dr. Gwen Pounds, MD Procedure Date: 05/21/2012  Procedure:   EGD with ED.  Indications:  Patient is 71 year old Caucasian male with history of proximal esophageal stricture secondary to radiation received for head and neck CA he remains in remission. His esophagus was last dilated with a Elease Hashimoto 46 [place]. He was able to swallow much better for about 2 weeks. He is starting to have dysphagia again.            Informed Consent:  The risks, benefits, alternatives & imponderables which include, but are not limited to, bleeding, infection, perforation, drug reaction and potential missed lesion have been reviewed.  The potential for biopsy, lesion removal, esophageal dilation, etc. have also been discussed.  Questions have been answered.  All parties agreeable.  Please see history & physical in medical record for more information.  Medications:  Demerol 50 mg IV Versed 10 mg IV Cetacaine spray topically for oropharyngeal anesthesia  Description of procedure:  The endoscope was introduced through the mouth and advanced to the second portion of the duodenum without difficulty or limitations. The mucosal surfaces were surveyed very carefully during advancement of the scope and upon withdrawal.  Findings:  Esophagus:  Stricture noted to proximal esophagus marked by pale mucosa estimated to be about 2 cm long. I was able to pass the scope across it without any difficulty. Few circumferential rings noted at esophageal body. GEJ:  42 cm Stomach:  Stomach was empty and distended very well with insufflation. Folds in the proximal stomach are normal. Examination of mucosa revealed few areas with petechiae and focal erythema. No erosions or ulcers were noted. Pyloric channel was patent. Angularis fundus and cardia were unremarkable. Duodenum:  Normal  bulbar and post bulbar mucosa.  Therapeutic/Diagnostic Maneuvers Performed:   Esophagus dilated by passing 48 French Maloney dilator to full insertion. Esophageal mucosa was examined and mucosal disruption noted at the level of stricture felt to be superficial. This stricture was further dilated to 50 [place] with  [facility] dilator and reexamined.  Complications:  None  Impression: Proximal esophageal stricture dilated by passing 48 and 50 [place] Maloney dilators. Nonerosive antral gastritis.  Recommendations:  Soft foods for 48 hours. Resume aspirin in 2 days. Patient will call office with progress report in 2 weeks.  [name],[name]  05/21/2012  2:43 PM  CC: Dr. Gwen Pounds, MD & Dr. Bonnetta Barry ref. provider found

## 2012-05-25 ENCOUNTER — Other Ambulatory Visit: Payer: Self-pay | Admitting: Lab

## 2012-05-27 ENCOUNTER — Encounter: Payer: Self-pay | Admitting: Oncology

## 2012-05-27 ENCOUNTER — Telehealth: Payer: Self-pay | Admitting: Oncology

## 2012-05-27 ENCOUNTER — Ambulatory Visit (HOSPITAL_BASED_OUTPATIENT_CLINIC_OR_DEPARTMENT_OTHER): Payer: Medicare Other | Admitting: Oncology

## 2012-05-27 VITALS — BP 105/66 | HR 69 | Temp 97.9°F | Resp 20 | Ht 73.0 in | Wt 173.9 lb

## 2012-05-27 DIAGNOSIS — C109 Malignant neoplasm of oropharynx, unspecified: Secondary | ICD-10-CM

## 2012-05-27 DIAGNOSIS — Z87891 Personal history of nicotine dependence: Secondary | ICD-10-CM | POA: Diagnosis not present

## 2012-05-27 DIAGNOSIS — B977 Papillomavirus as the cause of diseases classified elsewhere: Secondary | ICD-10-CM | POA: Diagnosis not present

## 2012-05-27 DIAGNOSIS — E039 Hypothyroidism, unspecified: Secondary | ICD-10-CM | POA: Insufficient documentation

## 2012-05-27 DIAGNOSIS — C01 Malignant neoplasm of base of tongue: Secondary | ICD-10-CM | POA: Diagnosis not present

## 2012-05-27 DIAGNOSIS — K117 Disturbances of salivary secretion: Secondary | ICD-10-CM

## 2012-05-27 MED ORDER — PILOCARPINE HCL 5 MG PO TABS: 5.0000 mg | ORAL_TABLET | Freq: Three times a day (TID) | ORAL | Status: DC

## 2012-05-27 NOTE — Telephone Encounter (Signed)
appts made and printed for pt aom °

## 2012-05-27 NOTE — Patient Instructions (Addendum)
A.  CT abdomen 03/31/12:  Findings: Clear lung bases. Normal heart size without pericardial  or pleural effusion. Prior median sternotomy.  Normal spleen, stomach, pancreas, gallbladder, biliary tract,  adrenal glands. Too small to characterize lesions within bilateral  kidneys. Probable focal fat adjacent the falciform ligament within  the left lobe of the liver.  Atherosclerosis, including at the origin of bilateral renal  arteries. No retroperitoneal or retrocrural adenopathy.  Colonic stool burden suggests constipation.  Normal small bowel without abdominal ascites.  The previously described superior mesenteric vein thrombosis is no  longer present. There is no residual perivascular edema  identified.  No acute osseous abnormality.   IMPRESSION:   1. No evidence of residual or recurrent superior mesenteric vein  thrombosis.  2. No acute abdominal process.  3. Possible constipation.  B.  CT neck 03/31/12:   Findings: Lung apices are stable except for subtle reticular  density at the apices which may be related to XRT. Negative  visualized superior mediastinum except for aortic and great vessel  atherosclerosis.  Marked regression of right level II lymphadenopathy since the  pretreatment study. Residual right level IIA lymph nodes measure  up to 6 mm in short axis (26 mm pretreatment).  Post XRT changes including diffuse soft tissue thickening of the  pharyngeal mucosa, submandibular gland atrophy, and mild  obscuration of the neck soft tissue planes.  No base of tongue or pharyngeal mass or hyperenhancement.  Negative parapharyngeal spaces, and sublingual space.  Retropharyngeal space within normal limits status post XRT. Larynx  and thyroid within normal limits. Parotid glands, orbit soft  tissues, and visualized brain parenchyma are within normal limits.  No cervical lymphadenopathy.  Major vascular structures in the neck are patent; there is a right  greater than left  carotid bifurcation atherosclerosis which has not  significantly changed.  No acute osseous abnormality identified. Visualized paranasal  sinuses and mastoids are clear.   IMPRESSION:   1. Satisfactory post-treatment appearance of the neck. No tongue  base mass or lymphadenopathy identified.  2. Abdomen CT reported separately.  C.  Follow up:  In about 6 months with CT neck the day prior.   D.  History of abdominal vessel blood clot while on therapy and with a feeding tube.  No chronic anticoagulation needed due to provoked event.

## 2012-05-28 DIAGNOSIS — H2510 Age-related nuclear cataract, unspecified eye: Secondary | ICD-10-CM | POA: Diagnosis not present

## 2012-05-28 DIAGNOSIS — H40019 Open angle with borderline findings, low risk, unspecified eye: Secondary | ICD-10-CM | POA: Diagnosis not present

## 2012-05-28 NOTE — Progress Notes (Signed)
[facility]  [phone] [phone]   OFFICE PROGRESS NOTE   Cc:  Gwen Pounds, MD  DIAGNOSIS: History of stage IVA, human papilloma virus negative right base of tongue squamous cell carcinoma with past history of cigar smoking.  PAST THERAPY: weekly cisplatin/daily radiation between 04/2011 and 06/2011.   CURRENT THERAPY: watchful observation.   INTERVAL HISTORY: George Bradley 71 y.o. male returns for regular follow up with his wife.  He reports that he still has dysphagia from radiation.  He underwent two esophageal dilation with some improvement.  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  He was given Neurontin which he takes as 300mg  PO qhs.  He has had some improvement but still has some symptoms.  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  He wants something for this.  His strength is now much better than a year ago.  He is planning to drive to [place] next week to buy an antique care.  He denied odynophagia, neck swelling.   Patient denies fever, anorexia, weight loss, fatigue, headache, visual changes, confusion, drenching night sweats, palpable lymph node swelling, mucositis, odynophagia, dysphagia, nausea vomiting, jaundice, chest pain, palpitation, shortness of breath, dyspnea on exertion, productive cough, gum bleeding, epistaxis, hematemesis, hemoptysis, abdominal pain, abdominal swelling, early satiety, melena, hematochezia, hematuria, skin rash, spontaneous bleeding, joint swelling, joint pain, heat or cold intolerance, bowel bladder incontinence, back pain, focal motor weakness, paresthesia, depression, suicidal or homicidal ideation, feeling hopelessness.   Past Medical History  Diagnosis Date  . Ischemic heart disease   . Aortic stenosis, mild   . Hyperlipidemia   . Insomnia   . Fatigue   . Dizziness   . Hypotension     orthostatic  . History of echocardiogram 02/09/2009    EF -  55-60%  . History of cardiovascular  stress test 03/14/2009    EF - 57%   /  mild anterior hyperkinesia with discrete anterior scar consistent with previous infarction but unchanged from previous studies.  Overall, stable findings -- Colleen Can. Deborah Chalk, MD  . Weakness   . ASCVD (arteriosclerotic cardiovascular disease)   . Arrhythmia     known history of  . Weight loss, abnormal 2012  . 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 Protein-calorie malnutrition, moderate 2012    on PEG tube feeding  . Cellulitis of artificial external opening, post-operative 2012    of PEG tube site  . Mesenteric thrombosis   . Hypothyroid 05/27/2012    Past Surgical History  Procedure Date  . 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 Cardiac catheterization 2007    EF - 55%  . Ablation of dysrhythmic focus   . Esophagogastroduodenoscopy 04/24/2011    Procedure: ESOPHAGOGASTRODUODENOSCOPY (EGD);  Surgeon: Malissa Hippo, MD;  Location: [facility];  Service: Endoscopy;  Laterality: N/A;  8:30 am  . Peg placement 04/24/2011    Procedure: PERCUTANEOUS ENDOSCOPIC GASTROSTOMY (PEG) PLACEMENT;  Surgeon: Malissa Hippo, MD;  Location: [facility];  Service: Endoscopy;  Laterality: N/A;    Current Outpatient Prescriptions  Medication Sig Dispense Refill  . aspirin EC 81 MG tablet Take 81 mg by mouth at bedtime.       . gabapentin (NEURONTIN) 100 MG capsule Take 300 mg by mouth at bedtime.      Marland Kitchen LORazepam (ATIVAN) 1 MG tablet Take 1 mg by mouth every 8 (eight) hours. For anxiety      . magnesium  oxide (MAG-OX) 400 MG tablet Take 400 mg by mouth every evening.      . pantoprazole (PROTONIX) 40 MG tablet Take 40 mg by mouth 2 (two) times daily before a meal.      . rosuvastatin (CRESTOR) 20 MG tablet Take 1 tablet (20 mg total) by mouth at bedtime.  90 tablet  3  . zolpidem (AMBIEN) 10 MG tablet Take 5 mg by mouth at bedtime as needed. For sleep.      . pilocarpine (SALAGEN) 5 MG tablet Take 1 tablet (5 mg total) by mouth 3 (three) times daily.  90  tablet  5    ALLERGIES:   has no known allergies.  REVIEW OF SYSTEMS:  The rest of the 14-point review of system was negative.   Filed Vitals:   05/27/12 1457  BP: 105/66  Pulse: 69  Temp: 97.9 F (36.6 C)  Resp: 20   Wt Readings from Last 3 Encounters:  05/27/12 173 lb 14.4 oz (78.881 kg)  05/11/12 174 lb 12.8 oz (79.289 kg)  05/03/12 172 lb (78.019 kg)   ECOG Performance status: 1  PHYSICAL EXAMINATION:   General:  Thin-appearing in no acute distress.  Eyes:  no scleral icterus.  ENT:  There were no oropharyngeal lesions.  Neck was without thyromegaly.  Lymphatics:  Negative cervical, supraclavicular or axillary adenopathy.  Respiratory: lungs were clear bilaterally without wheezing or crackles.  Cardiovascular:  Regular rate and rhythm, S1/S2, without murmur, rub or gallop.  There was no pedal edema.  GI:  abdomen was soft, flat, nontender, nondistended, without organomegaly.  Muscoloskeletal:  no spinal tenderness of palpation of vertebral spine.  Skin exam was without echymosis, petichae.  Neuro exam was nonfocal.  Patient was able to get on and off exam table without assistance.  Gait was normal.  Patient was alerted and oriented.  Attention was good.   Language was appropriate.  Mood was normal without depression.  Speech was not pressured.  Thought content was not tangential.    LABORATORY/RADIOLOGY DATA:  Lab Results  Component Value Date   WBC 4.9 05/17/2012   HGB 13.5 05/17/2012   HCT 39.7 05/17/2012   PLT 129* 05/17/2012   GLUCOSE 99 03/31/2012   CHOL 161 02/20/2011   TRIG 101.0 02/20/2011   HDL 51.60 02/20/2011   LDLCALC 89 02/20/2011   ALKPHOS 36 03/31/2012   ALT 44 10/09/2011   AST 30 03/31/2012   NA 140 03/31/2012   K 4.5 03/31/2012   CL 100 03/31/2012   CREATININE 1.1 03/31/2012   BUN 19 03/31/2012   CO2 31 03/31/2012   INR 1.07 11/11/2011    ASSESSMENT AND PLAN:    1. History of Oropharynx squamous cell carcinoma: no recurrence.  In remission.  He does not smoke  cigarettes, chew tobacco, or drink EtOH.  2. Xerostomia:  I wrote him a Rx for Salagen.  I advised him not to get this filled until he has been seen by his Opthalmologist to rule out glaucoma since Salagen can result in glaucoma.  3. Mild to moderate calorie protein malnutrition: much improved from prior.  He is on regular foods. 4. 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  He f/u with GI.  5. Abdominal pain: Unclear etiology.  No finding on this most recent CT scan to suggest pathology.  He is on Ativan which seems to help his abdominal wall spasm per GI.  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  He  has been off of Coumadin for 3 months without problem.  The thrombosis occurred when he had PEG tube and on treatment for HNSCC.  Thus, there is no indication for chronic anticoagulation.  7. Follow up: CT neck in about 6 months with visit the day after.  I advised him to f/u with Rad Onc and ENT as well.   The length of time of the encounter was 25 minutes. More than 50% of time was spent counseling and coordination of care.

## 2012-06-04 ENCOUNTER — Encounter: Payer: Self-pay | Admitting: Radiation Oncology

## 2012-06-07 ENCOUNTER — Encounter: Payer: Self-pay | Admitting: Radiation Oncology

## 2012-06-07 ENCOUNTER — Ambulatory Visit
Admission: RE | Admit: 2012-06-07 | Discharge: 2012-06-07 | Disposition: A | Discharge status: Home / Self Care | Payer: Medicare Other | Source: Ambulatory Visit | Attending: Radiation Oncology | Admitting: Radiation Oncology

## 2012-06-07 ENCOUNTER — Ambulatory Visit: Payer: Self-pay | Admitting: Oncology

## 2012-06-07 VITALS — BP 112/68 | HR 62 | Temp 98.3°F | Wt 176.5 lb

## 2012-06-07 DIAGNOSIS — C01 Malignant neoplasm of base of tongue: Secondary | ICD-10-CM | POA: Diagnosis not present

## 2012-06-07 NOTE — Progress Notes (Signed)
Patient here for routine follow up completion of head/neck radiation October 12,2012.Patient is doing very well.Had esophageal dilation x 2.Feedng tube removed.CT-scan of the abdomen completed 7/10 was fine scheduled for next one in March 2014.Continued dry mouth improved with salagen.Starting to gain weight again.

## 2012-06-07 NOTE — Progress Notes (Signed)
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  He seems to be making good progress. The patient continues to have xerostomia. He was placed on Salagen by Dr. Gaylyn Rong which seems to be mildly helpful. Patient has had to the dilations of his esophagus. He currently denies any sore throat or dysphagia. He denies any otalgia or breathing problems.   He continues to have somewhat of a poor taste. Bloodwork recently shows mild elevation of the patient's TSH. Dr. Gaylyn Rong will be following this closely with routine blood work .    CT scans of the neck in July this year showed no evidence recurrence.                      ALLERGIES:   has no known allergies.  Meds: Current Outpatient Prescriptions  Medication Sig Dispense Refill  . aspirin EC 81 MG tablet Take 81 mg by mouth at bedtime.       . gabapentin (NEURONTIN) 100 MG capsule Take 300 mg by mouth at bedtime.      Marland Kitchen LORazepam (ATIVAN) 1 MG tablet Take 1 mg by mouth every 8 (eight) hours. For anxiety      . magnesium oxide (MAG-OX) 400 MG tablet Take 400 mg by mouth every evening.      . pantoprazole (PROTONIX) 40 MG tablet Take 40 mg by mouth 2 (two) times daily before a meal.      . pilocarpine (SALAGEN) 5 MG tablet Take 1 tablet (5 mg total) by mouth 3 (three) times daily.  90 tablet  5  . rosuvastatin (CRESTOR) 20 MG tablet Take 1 tablet (20 mg total) by mouth at bedtime.  90 tablet  3  . zolpidem (AMBIEN) 10 MG tablet Take 5 mg by mouth at bedtime as needed. For sleep.        Physical Findings: The patient is in no acute distress. Patient is alert and oriented.  weight is 176 lb 8 oz (80.06 kg). His  temperature is 98.3 F (36.8 C). His blood pressure is 112/68 and his pulse is 62. His oxygen saturation is 100%. .  No palpable cervical subclavicular or axillary adenopathy. The lungs are clear to auscultation. The heart has regular rhythm and rate.  Examination of oral cavity reveals mild xerostomia. There are no mucosal lesions noted.. The patient proceeded to undergo indirect mirror examination through the oral cavity. Good view of the base of tongue and vallecula was noted.. There are no mucosal lesions noted. The patient's vocal cord  moved well on exam.  Palpation along the base of tongue region reveals no suspicious induration.  Lab Findings: Lab Results  Component Value Date   WBC 4.9 05/17/2012   HGB 13.5 05/17/2012   HCT 39.7 05/17/2012   MCV 97.0 05/17/2012   PLT 129* 05/17/2012    @LASTCHEM @  Radiographic Findings: No results found.  Impression:  The patient is recovering from the effects of radiation.  No evidence of recurrence on clinical exam today.  Plan:  Routine followup in 3 months.  _____________________________________    Billie Lade, PhD, MD

## 2012-06-10 DIAGNOSIS — Z23 Encounter for immunization: Secondary | ICD-10-CM | POA: Diagnosis not present

## 2012-06-10 DIAGNOSIS — I2510 Atherosclerotic heart disease of native coronary artery without angina pectoris: Secondary | ICD-10-CM | POA: Diagnosis not present

## 2012-06-10 DIAGNOSIS — R1319 Other dysphagia: Secondary | ICD-10-CM | POA: Diagnosis not present

## 2012-06-10 DIAGNOSIS — E079 Disorder of thyroid, unspecified: Secondary | ICD-10-CM | POA: Diagnosis not present

## 2012-06-10 DIAGNOSIS — E785 Hyperlipidemia, unspecified: Secondary | ICD-10-CM | POA: Diagnosis not present

## 2012-06-14 DIAGNOSIS — L820 Inflamed seborrheic keratosis: Secondary | ICD-10-CM | POA: Diagnosis not present

## 2012-06-14 DIAGNOSIS — L570 Actinic keratosis: Secondary | ICD-10-CM | POA: Diagnosis not present

## 2012-06-23 DIAGNOSIS — R5381 Other malaise: Secondary | ICD-10-CM | POA: Diagnosis not present

## 2012-06-23 DIAGNOSIS — R5383 Other fatigue: Secondary | ICD-10-CM | POA: Diagnosis not present

## 2012-06-24 ENCOUNTER — Other Ambulatory Visit (INDEPENDENT_AMBULATORY_CARE_PROVIDER_SITE_OTHER): Payer: Self-pay | Admitting: Internal Medicine

## 2012-06-28 ENCOUNTER — Other Ambulatory Visit (INDEPENDENT_AMBULATORY_CARE_PROVIDER_SITE_OTHER): Payer: Self-pay | Admitting: Internal Medicine

## 2012-08-02 ENCOUNTER — Other Ambulatory Visit (INDEPENDENT_AMBULATORY_CARE_PROVIDER_SITE_OTHER): Payer: Self-pay | Admitting: *Deleted

## 2012-08-02 ENCOUNTER — Telehealth (INDEPENDENT_AMBULATORY_CARE_PROVIDER_SITE_OTHER): Payer: Self-pay | Admitting: *Deleted

## 2012-08-02 ENCOUNTER — Ambulatory Visit (INDEPENDENT_AMBULATORY_CARE_PROVIDER_SITE_OTHER): Payer: Medicare Other | Admitting: Internal Medicine

## 2012-08-02 ENCOUNTER — Encounter (INDEPENDENT_AMBULATORY_CARE_PROVIDER_SITE_OTHER): Payer: Self-pay | Admitting: Internal Medicine

## 2012-08-02 VITALS — BP 116/70 | HR 68 | Temp 97.6°F | Resp 18 | Ht 73.0 in | Wt 176.2 lb

## 2012-08-02 DIAGNOSIS — Z0189 Encounter for other specified special examinations: Secondary | ICD-10-CM | POA: Diagnosis not present

## 2012-08-02 DIAGNOSIS — R109 Unspecified abdominal pain: Secondary | ICD-10-CM | POA: Diagnosis not present

## 2012-08-02 DIAGNOSIS — R627 Adult failure to thrive: Secondary | ICD-10-CM

## 2012-08-02 LAB — CREATININE, SERUM: Creat: 1.26 mg/dL (ref 0.50–1.35)

## 2012-08-02 MED ORDER — LORAZEPAM 1 MG PO TABS: 1.0000 mg | ORAL_TABLET | Freq: Three times a day (TID) | ORAL | Status: DC | PRN

## 2012-08-02 NOTE — Patient Instructions (Signed)
Decrease pantoprazole to 40 mg by mouth 30 minutes before breakfast daily. Notify if you have to go back on double dose. Physician will contact you with the results of CT angiogram abdomen when completed.

## 2012-08-02 NOTE — Progress Notes (Signed)
Presenting complaint;  Persistent abdominal pain.  Subjective:  Patient is 71 year old Caucasian male who presents with persistent abdominal pain. He has had abdominal pain ever since he had surgical placement of PEG. Pain has persisted even after the PEG was removed. 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 Clot had dissolved on a followup CT of January 2013 and July, 2013. Over the last 2 weeks he has noted worsening pain. He has pain every day with score of 2-5. When he called to make an appointment he had pain score of 10. He feels the best when he wakes up and his pain may be 1 or 2 but as the day progresses it increases in intensity. He describes as squeezing pain located across mid abdomen. He denies pain at site of previous PEG tube. This pain may last for several minutes is not associated with nausea vomiting or diarrhea. He is not taking lorazepam on schedule. He has lost 40 pounds since his lingual cancer was diagnosed last year. On his January 2013 visit he weighed 189 pounds. Now he weighs 176 pounds. He does not take Celebrex regularly. He takes it primarily anesthesia as an as hands and feet. He states his swallowing has improved a great deal but not 100%. He has to wash his food down with sips of water or liquids. Other day he was able to be needed without difficulty. Proximal esophageal stricture was dilated twice in August 2013. On his last dilation it was stretched to 29 [place].  Current Medications: Current Outpatient Prescriptions  Medication Sig Dispense Refill  . aspirin EC 81 MG tablet Take 81 mg by mouth at bedtime.       . celecoxib (CELEBREX) 100 MG capsule Take 100 mg by mouth daily.      Marland Kitchen gabapentin (NEURONTIN) 100 MG capsule Take 300 mg by mouth at bedtime.      Marland Kitchen LORazepam (ATIVAN) 1 MG tablet TAKE ONE TABLET BY MOUTH EVERY 8 HOURS AS NEEDED FOR ANXIETY  90 tablet  1  . pantoprazole (PROTONIX) 40  MG tablet Take 40 mg by mouth 2 (two) times daily before a meal.      . pilocarpine (SALAGEN) 5 MG tablet Take 1 tablet (5 mg total) by mouth 3 (three) times daily.  90 tablet  5  . rosuvastatin (CRESTOR) 20 MG tablet Take 1 tablet (20 mg total) by mouth at bedtime.  90 tablet  3  . zolpidem (AMBIEN) 10 MG tablet Take 5 mg by mouth at bedtime as needed. For sleep.        Objective: Blood pressure 116/70, pulse 68, temperature 97.6 F (36.4 C), temperature source Oral, resp. rate 18, height 6\' 1"  (1.854 m), weight 176 lb 3.2 oz (79.924 kg). Patient is alert and in no acute distress. Conjunctiva is pink. Sclera is nonicteric Oropharyngeal mucosa is normal. No neck masses or thyromegaly noted. Chest is symmetrical. 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 Cardiac exam with regular rhythm normal S1 and S2. No murmur or gallop noted. Lungs are clear to auscultation. Abdomen is flat. Bowel sounds are normal. No bruits noted. Abdomen is soft with mild tenderness in right lower quadrant. No organomegaly or masses. No LE edema or clubbing noted.  Lab data; Most recent abdominopelvic CT was on 03/31/2012. Calcification noted the takeoff of celiac trunk and SMA. Last CT Angio was in December 2012 revealing plaque at origin of SMA.  Assessment:  #1. Chronic abdominal pain. Worse with meals and partially relieved with lorazepam. Given continued weight loss need to rule out abdominal angina or intestinal ischemia. 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 #2. Dysphagia secondary to proximal esophageal stricture possibly do to RT. Stricture was last dilated on 05/03/2012 to 46 [place]. If dysphagia worsens will redilate as esophagus. #3 GERD. Heartburn is well controlled with double dose PPI. Will try him on single dose daily. #4. History of squamous cell carcinoma of base of tongue. Status post chemoradiation last  year he remains free of disease.   Plan:  Decrease pantoprazole to 40 mg by mouth every morning. Notify if breakthrough symptoms occur. CT angiogram abdomen and pelvis. Lorazepam prescription filled at 1 mg by mouth 3 times a day when necessary 90 doses with 2 refills. Office visit in 6 months.

## 2012-08-02 NOTE — Telephone Encounter (Signed)
Patient will need to have this prior to CT

## 2012-08-03 ENCOUNTER — Encounter (HOSPITAL_COMMUNITY): Payer: Self-pay | Admitting: Pharmacy Technician

## 2012-08-05 ENCOUNTER — Encounter (HOSPITAL_COMMUNITY): Admission: RE | Payer: Self-pay | Source: Ambulatory Visit

## 2012-08-05 ENCOUNTER — Ambulatory Visit (HOSPITAL_COMMUNITY): Admission: RE | Admit: 2012-08-05 | Payer: Medicare Other | Source: Ambulatory Visit | Admitting: Internal Medicine

## 2012-08-05 SURGERY — EGD (ESOPHAGOGASTRODUODENOSCOPY)
Anesthesia: Moderate Sedation

## 2012-08-06 ENCOUNTER — Ambulatory Visit (HOSPITAL_COMMUNITY): Admission: RE | Admit: 2012-08-06 | Payer: Medicare Other | Source: Ambulatory Visit

## 2012-08-09 ENCOUNTER — Other Ambulatory Visit: Payer: Self-pay | Admitting: Cardiology

## 2012-08-09 MED ORDER — ROSUVASTATIN CALCIUM 20 MG PO TABS: 20.0000 mg | ORAL_TABLET | Freq: Every day | ORAL | Status: DC

## 2012-08-17 ENCOUNTER — Ambulatory Visit (HOSPITAL_COMMUNITY)
Admission: RE | Admit: 2012-08-17 | Discharge: 2012-08-17 | Disposition: A | Discharge status: Home / Self Care | Payer: Medicare Other | Source: Ambulatory Visit | Attending: Internal Medicine | Admitting: Internal Medicine

## 2012-08-17 DIAGNOSIS — I700 Atherosclerosis of aorta: Secondary | ICD-10-CM | POA: Diagnosis not present

## 2012-08-17 DIAGNOSIS — R109 Unspecified abdominal pain: Secondary | ICD-10-CM

## 2012-08-17 MED ORDER — IOHEXOL 350 MG/ML SOLN
100.0000 mL | Freq: Once | INTRAVENOUS | Status: AC | PRN
  Administered 2012-08-17: 100 mL via INTRAVENOUS

## 2012-08-18 ENCOUNTER — Telehealth (INDEPENDENT_AMBULATORY_CARE_PROVIDER_SITE_OTHER): Payer: Self-pay | Admitting: *Deleted

## 2012-08-18 NOTE — Telephone Encounter (Signed)
Patient was called and made aware of his results. No clots are present. Dr.[name] is aware of his results and plans to call the patient next week to discuss his recommendations with him.

## 2012-08-24 ENCOUNTER — Other Ambulatory Visit (INDEPENDENT_AMBULATORY_CARE_PROVIDER_SITE_OTHER): Payer: Self-pay | Admitting: *Deleted

## 2012-08-24 ENCOUNTER — Encounter (HOSPITAL_COMMUNITY): Payer: Self-pay | Admitting: Pharmacy Technician

## 2012-08-24 DIAGNOSIS — R1310 Dysphagia, unspecified: Secondary | ICD-10-CM

## 2012-08-24 DIAGNOSIS — K222 Esophageal obstruction: Secondary | ICD-10-CM

## 2012-09-02 ENCOUNTER — Encounter (HOSPITAL_COMMUNITY): Payer: Self-pay | Admitting: *Deleted

## 2012-09-02 ENCOUNTER — Ambulatory Visit (HOSPITAL_COMMUNITY)
Admission: RE | Admit: 2012-09-02 | Discharge: 2012-09-02 | Disposition: A | Discharge status: Home / Self Care | Payer: Medicare Other | Source: Ambulatory Visit | Attending: Internal Medicine | Admitting: Internal Medicine

## 2012-09-02 ENCOUNTER — Encounter (HOSPITAL_COMMUNITY)
Admission: RE | Disposition: A | Discharge status: Home / Self Care | Payer: Self-pay | Source: Ambulatory Visit | Attending: Internal Medicine

## 2012-09-02 DIAGNOSIS — K222 Esophageal obstruction: Secondary | ICD-10-CM | POA: Diagnosis not present

## 2012-09-02 DIAGNOSIS — R1310 Dysphagia, unspecified: Secondary | ICD-10-CM

## 2012-09-02 HISTORY — PX: ESOPHAGOGASTRODUODENOSCOPY (EGD) WITH ESOPHAGEAL DILATION: SHX5812

## 2012-09-02 SURGERY — ESOPHAGOGASTRODUODENOSCOPY (EGD) WITH ESOPHAGEAL DILATION
Anesthesia: Moderate Sedation

## 2012-09-02 MED ORDER — MEPERIDINE HCL 25 MG/ML IJ SOLN
INTRAMUSCULAR | Status: DC | PRN
  Administered 2012-09-02 (×2): 25 mg via INTRAVENOUS

## 2012-09-02 MED ORDER — BUTAMBEN-TETRACAINE-BENZOCAINE 2-2-14 % EX AERO
INHALATION_SPRAY | CUTANEOUS | Status: DC | PRN
  Administered 2012-09-02: 2 via TOPICAL

## 2012-09-02 MED ORDER — STERILE WATER FOR IRRIGATION IR SOLN
Status: DC | PRN
  Administered 2012-09-02: 15:00:00

## 2012-09-02 MED ORDER — MIDAZOLAM HCL 5 MG/5ML IJ SOLN
INTRAMUSCULAR | Status: AC
  Filled 2012-09-02: qty 10

## 2012-09-02 MED ORDER — MEPERIDINE HCL 50 MG/ML IJ SOLN
INTRAMUSCULAR | Status: AC
  Filled 2012-09-02: qty 1

## 2012-09-02 MED ORDER — MIDAZOLAM HCL 5 MG/5ML IJ SOLN
INTRAMUSCULAR | Status: DC | PRN
  Administered 2012-09-02 (×2): 2 mg via INTRAVENOUS
  Administered 2012-09-02: 3 mg via INTRAVENOUS

## 2012-09-02 MED ORDER — SODIUM CHLORIDE 0.45 % IV SOLN
INTRAVENOUS | Status: DC
  Administered 2012-09-02: 14:00:00 via INTRAVENOUS

## 2012-09-02 NOTE — Op Note (Signed)
EGD PROCEDURE REPORT  PATIENT:  George Bradley  MR#:  [id number] Birthdate:  05-Jul-1941, 71 y.o., male Endoscopist:  Dr. Malissa Hippo, MD Referred By:  Dr. Gwen Pounds, MD Procedure Date: 09/02/2012  Procedure:   EGD with ED.  Indications:  Patient is 71 year old Caucasian male with history of proximal esophageal stricture secondary to radiation therapy that he received for head neck CA. His last dilation was on 05/21/2012 and he has done well until 2 weeks ago. Esophageal stricture was dilated to 50 [place] on his last EGD.           Informed Consent:  The risks, benefits, alternatives & imponderables which include, but are not limited to, bleeding, infection, perforation, drug reaction and potential missed lesion have been reviewed.  The potential for biopsy, lesion removal, esophageal dilation, etc. have also been discussed.  Questions have been answered.  All parties agreeable.  Please see history & physical in medical record for more information.  Medications:  Demerol 50 mg IV Versed 7 mg IV Cetacaine spray topically for oropharyngeal anesthesia  Description of procedure:  The endoscope was introduced through the mouth and advanced to the second portion of the duodenum without difficulty or limitations. The mucosal surfaces were surveyed very carefully during advancement of the scope and upon withdrawal.  Findings:  Esophagus:  Esophageal stricture noted just below the UES. Local scarring noted but no ulcer or mass noted. GEJ:  41 cm Stomach:  Stomach was empty and distended very well with insufflation. Folds in the proximal stomach were normal. Examination mucosa at body, antrum, pyloric channel, angularis, fundus and cardia was normal.  Duodenum:  Normal bulbar and post bulbar mucosa.  Therapeutic/Diagnostic Maneuvers Performed:  Esophagus was dilated by passing 52 [place] Maloney dilator to full insertion. As the dilator was withdrawn endoscope was passed again and esophagus  reexamined and there was superficial mucosal disruption from 18-20 mm. Endoscope was withdrawn.  Complications:  None  Impression: Proximal radiation-induced esophageal stricture dilated by passing 52 [place] Maloney dilator.  Recommendations:  Standard instructions given. Patient advised to stay on soft foods for two days. Patient will call in dysphagia recurs.   [name],[name]  09/02/2012  3:21 PM  CC: Dr. Gwen Pounds, MD & Dr. Bonnetta Barry ref. provider found

## 2012-09-02 NOTE — H&P (Signed)
George Bradley is an 71 y.o. male.   Chief Complaint: Patient if her EGD and ED. HPI: Patient is 71 year old Caucasian male who is proximal esophageal stricture secondary to radiation therapy. The stricture was then dilated twice in the past. Most recently was 14 weeks ago. Was dilated to 44 [place] with a Maloney. He presents with progressive solid food dysphagia. He is maintaining his weight. He states he does not experience abdominal pain has long as he takes lorazepam 3 times a day. Recent CT did not show significant disease to viscera arteries.  Past Medical History  Diagnosis Date  . Ischemic heart disease   . Aortic stenosis, mild   . Hyperlipidemia   . Insomnia   . Fatigue   . Dizziness   . Hypotension     orthostatic  . History of echocardiogram 02/09/2009    EF -  55-60%  . History of cardiovascular stress test 03/14/2009    EF - 57%   /  mild anterior hyperkinesia with discrete anterior scar consistent with previous infarction but unchanged from previous studies.  Overall, stable findings -- George Bradley. Deborah Chalk, MD  . Weakness   . ASCVD (arteriosclerotic cardiovascular disease)   . Arrhythmia     known history of  . Weight loss, abnormal 2012  . 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 Protein-calorie malnutrition, moderate 2012    on PEG tube feeding  . Cellulitis of artificial external opening, post-operative 2012    of PEG tube site  . Mesenteric thrombosis   . 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 S/P radiation therapy 05/13/11-07/04/11    7000 cGy  Tongue Carcinoma    Past Surgical History  Procedure Date  . 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 Cardiac catheterization 2007    EF - 55%  . Ablation of dysrhythmic focus   . Esophagogastroduodenoscopy 04/24/2011    Procedure: ESOPHAGOGASTRODUODENOSCOPY (EGD);  Surgeon: Malissa Hippo, MD;  [facility];  Service: Endoscopy;  Laterality: N/A;  8:30 am  . Peg placement 04/24/2011    Procedure: PERCUTANEOUS ENDOSCOPIC  GASTROSTOMY (PEG) PLACEMENT;  Surgeon: Malissa Hippo, MD;  Location: [facility];  Service: Endoscopy;  Laterality: N/A;    Family History  Problem Relation Age of Onset  . Heart disease Father   . Heart disease Brother   . Heart disease Sister   . Cancer Sister     breast cancer  . Cancer Mother     breast ca  . Hyperlipidemia Son    Social History:  reports that he has quit smoking. His smoking use included Cigars. He quit smokeless tobacco use about 15 months ago. He reports that he does not drink alcohol or use illicit drugs.  Allergies: No Known Allergies  Medications Prior to Admission  Medication Sig Dispense Refill  . aspirin EC 81 MG tablet Take 81 mg by mouth at bedtime.       . gabapentin (NEURONTIN) 100 MG capsule Take 300 mg by mouth 3 (three) times daily.       Marland Kitchen LORazepam (ATIVAN) 1 MG tablet Take 1 mg by mouth 2 (two) times daily.       . pantoprazole (PROTONIX) 40 MG tablet Take 40 mg by mouth daily.       . pilocarpine (SALAGEN) 5 MG tablet Take 1 tablet (5 mg total) by mouth 3 (three) times daily.  90 tablet  5  . rosuvastatin (CRESTOR) 20 MG tablet Take 1 tablet (20  mg total) by mouth at bedtime.  90 tablet  3  . zolpidem (AMBIEN) 10 MG tablet Take 5-10 mg by mouth at bedtime as needed. For sleep.        No results found for this or any previous visit (from the past 48 hour(s)). No results found.  ROS  Blood pressure 125/64, pulse 54, temperature 98.5 F (36.9 C), temperature source Oral, resp. rate 13, height 6\' 1"  (1.854 m), weight 176 lb (79.833 kg), SpO2 98.00%. Physical Exam  Constitutional: He appears well-developed.       thin Caucasian male in NAD  HENT:  Mouth/Throat: Oropharynx is clear and moist.  Eyes: Conjunctivae normal are normal. No scleral icterus.  Neck: No thyromegaly present.  Cardiovascular: Normal rate, regular rhythm and normal heart sounds.   Respiratory: Effort normal and breath sounds normal.  GI: Soft. Bowel sounds are  normal. He exhibits no distension and no mass. There is no tenderness.  Musculoskeletal: He exhibits no edema.  Lymphadenopathy:    He has no cervical adenopathy.  Neurological: He is alert.  Skin: Skin is dry.     Assessment/Plan Dysphagia in a patient with known radiation-induced esophageal stricture. EGD with ED.  [name],[name] 09/02/2012, 2:56 PM

## 2012-09-07 ENCOUNTER — Encounter (HOSPITAL_COMMUNITY): Payer: Self-pay | Admitting: Internal Medicine

## 2012-09-08 ENCOUNTER — Encounter: Payer: Self-pay | Admitting: Radiation Oncology

## 2012-09-09 ENCOUNTER — Ambulatory Visit
Admission: RE | Admit: 2012-09-09 | Discharge: 2012-09-09 | Disposition: A | Discharge status: Home / Self Care | Payer: Medicare Other | Source: Ambulatory Visit | Attending: Radiation Oncology | Admitting: Radiation Oncology

## 2012-09-09 ENCOUNTER — Encounter: Payer: Self-pay | Admitting: Radiation Oncology

## 2012-09-09 VITALS — BP 113/72 | HR 69 | Temp 97.7°F | Wt 180.9 lb

## 2012-09-09 DIAGNOSIS — C01 Malignant neoplasm of base of tongue: Secondary | ICD-10-CM

## 2012-09-09 NOTE — Progress Notes (Signed)
  Radiation Oncology         [phone] ________________________________  Name: George Bradley MRN: [id number]  Date: 09/09/2012  DOB: [birth year]  Follow-Up Visit Note  CC: Gwen Pounds, MD  Exie Parody, MD  Diagnosis:   Stage IVA squamous cell carcinoma of the right base of tongue  Interval Since Last Radiation:  One year and 2 months   Narrative:  The patient returns today for routine follow-up.  He continues have somewhat of a poor taste and xerostomia. He however does understand the importance of regular meals to maintain weight and nutrition.  He has undergone esophageal dilation on 3 occasions with fourth scheduled in the near future.  He denies any significant pain with swallowing. Patient denies any cough or breathing problems. He is eager to restart a regular exercise program which I encouraged.                              ALLERGIES:   has no known allergies.  Meds: Current Outpatient Prescriptions  Medication Sig Dispense Refill  . aspirin EC 81 MG tablet Take 81 mg by mouth at bedtime.       . gabapentin (NEURONTIN) 100 MG capsule Take 300 mg by mouth 3 (three) times daily.       Marland Kitchen LORazepam (ATIVAN) 1 MG tablet Take 1 mg by mouth every 8 (eight) hours as needed.      . pantoprazole (PROTONIX) 40 MG tablet Take 40 mg by mouth daily.       . pilocarpine (SALAGEN) 5 MG tablet Take 1 tablet (5 mg total) by mouth 3 (three) times daily.  90 tablet  5  . rosuvastatin (CRESTOR) 20 MG tablet Take 1 tablet (20 mg total) by mouth at bedtime.  90 tablet  3  . zolpidem (AMBIEN) 10 MG tablet Take 5-10 mg by mouth at bedtime as needed. For sleep.        Physical Findings: The patient is in no acute distress. Patient is alert and oriented.  weight is 180 lb 14.4 oz (82.056 kg). His temperature is 97.7 F (36.5 C). His blood pressure is 113/72 and his pulse is 69. Marland Kitchen  No palpable cervical supraclavicular or axillary adenopathy. The lungs are clear to auscultation. The heart has regular  rhythm and rate. Examination of oral cavity reveals mucosa to be mildly dry. There no mucosal lesions noted in oral cavity or posterior pharynx. Patient proceeded to undergo indirect mirror examination. A good view of the base of tongue area was obtained. There are no mucosal lesions noted. It were some radiation changes noted. The vocal cords moved well on exam. Palpation along the base of tongue and oral cavity area revealed no suspicious induration.  Lab Findings: Lab Results  Component Value Date   WBC 4.9 05/17/2012   HGB 13.5 05/17/2012   HCT 39.7 05/17/2012   MCV 97.0 05/17/2012   PLT 129* 05/17/2012       Impression:  The patient is recovering from the effects of radiation.  No evidence of recurrence on clinical exam today  Plan:  Routine followup in 3 months.   patient will be seen by medical oncology in March of 2014  _____________________________________  -----------------------------------  Billie Lade, PhD, MD

## 2012-09-09 NOTE — Progress Notes (Signed)
Patient here for routine follow up.Doing well except continued difficulth with eating related to pulling of stomach which had to be moved when feeding tube was placed.Had esophageal dilation for fourth time on 09/02/12.Weight maintained.

## 2012-10-12 ENCOUNTER — Telehealth (INDEPENDENT_AMBULATORY_CARE_PROVIDER_SITE_OTHER): Payer: Self-pay | Admitting: *Deleted

## 2012-10-12 NOTE — Telephone Encounter (Signed)
Talk with patient's wife [name]. Will make an appointment for him to see Dr. Claire Shown associates at [facility], please make an appointment.

## 2012-10-12 NOTE — Telephone Encounter (Signed)
Patients wife called stating George Bradley was still having abdominal pain and that you had mentioned before about referring him to [facility] but at that time George Bradley was interested and now he is but wants you to call and talk to him about, he can be reached at [phone]

## 2012-10-13 NOTE — Telephone Encounter (Signed)
Referral & notes have been faxed to [facility], they will contact patient with appointment, patient is aware

## 2012-10-18 ENCOUNTER — Other Ambulatory Visit (INDEPENDENT_AMBULATORY_CARE_PROVIDER_SITE_OTHER): Payer: Self-pay | Admitting: Internal Medicine

## 2012-10-18 ENCOUNTER — Telehealth (INDEPENDENT_AMBULATORY_CARE_PROVIDER_SITE_OTHER): Payer: Self-pay | Admitting: *Deleted

## 2012-10-18 NOTE — Telephone Encounter (Signed)
Patient has appt @ [facility] w/ Dr Jacinto Reap 12/13/12 @ 815 am, patient aware

## 2012-10-23 DEATH — deceased

## 2012-11-25 ENCOUNTER — Other Ambulatory Visit (HOSPITAL_BASED_OUTPATIENT_CLINIC_OR_DEPARTMENT_OTHER): Payer: Medicare Other

## 2012-11-25 ENCOUNTER — Ambulatory Visit (HOSPITAL_COMMUNITY)
Admission: RE | Admit: 2012-11-25 | Discharge: 2012-11-25 | Disposition: A | Discharge status: Home / Self Care | Payer: Medicare Other | Source: Ambulatory Visit | Attending: Oncology | Admitting: Oncology

## 2012-11-25 ENCOUNTER — Encounter (HOSPITAL_COMMUNITY): Payer: Self-pay

## 2012-11-25 DIAGNOSIS — Z9221 Personal history of antineoplastic chemotherapy: Secondary | ICD-10-CM | POA: Insufficient documentation

## 2012-11-25 DIAGNOSIS — C109 Malignant neoplasm of oropharynx, unspecified: Secondary | ICD-10-CM | POA: Insufficient documentation

## 2012-11-25 DIAGNOSIS — J9819 Other pulmonary collapse: Secondary | ICD-10-CM | POA: Insufficient documentation

## 2012-11-25 DIAGNOSIS — Z923 Personal history of irradiation: Secondary | ICD-10-CM | POA: Insufficient documentation

## 2012-11-25 DIAGNOSIS — I6529 Occlusion and stenosis of unspecified carotid artery: Secondary | ICD-10-CM | POA: Insufficient documentation

## 2012-11-25 DIAGNOSIS — K117 Disturbances of salivary secretion: Secondary | ICD-10-CM | POA: Diagnosis not present

## 2012-11-25 DIAGNOSIS — Z85819 Personal history of malignant neoplasm of unspecified site of lip, oral cavity, and pharynx: Secondary | ICD-10-CM | POA: Diagnosis not present

## 2012-11-25 DIAGNOSIS — I658 Occlusion and stenosis of other precerebral arteries: Secondary | ICD-10-CM | POA: Insufficient documentation

## 2012-11-25 DIAGNOSIS — R1310 Dysphagia, unspecified: Secondary | ICD-10-CM | POA: Insufficient documentation

## 2012-11-25 DIAGNOSIS — E039 Hypothyroidism, unspecified: Secondary | ICD-10-CM

## 2012-11-25 LAB — COMPREHENSIVE METABOLIC PANEL (CC13)
ALT: 20 U/L (ref 0–55)
AST: 24 U/L (ref 5–34)
Alkaline Phosphatase: 35 U/L — ABNORMAL LOW (ref 40–150)
BUN: 18.4 mg/dL (ref 7.0–26.0)
Creatinine: 1.3 mg/dL (ref 0.7–1.3)
Total Bilirubin: 0.49 mg/dL (ref 0.20–1.20)

## 2012-11-25 LAB — CBC WITH DIFFERENTIAL/PLATELET
BASO%: 0.4 % (ref 0.0–2.0)
Basophils Absolute: 0 10*3/uL (ref 0.0–0.1)
EOS%: 1.2 % (ref 0.0–7.0)
HCT: 40.6 % (ref 38.4–49.9)
LYMPH%: 26.8 % (ref 14.0–49.0)
MCH: 33 pg (ref 27.2–33.4)
MCHC: 34.4 g/dL (ref 32.0–36.0)
MCV: 96 fL (ref 79.3–98.0)
MONO%: 13.6 % (ref 0.0–14.0)
NEUT%: 58 % (ref 39.0–75.0)
Platelets: 104 10*3/uL — ABNORMAL LOW (ref 140–400)
lymph#: 1.4 10*3/uL (ref 0.9–3.3)

## 2012-11-25 MED ORDER — IOHEXOL 300 MG/ML  SOLN
100.0000 mL | Freq: Once | INTRAMUSCULAR | Status: AC | PRN
  Administered 2012-11-25: 100 mL via INTRAVENOUS

## 2012-11-26 ENCOUNTER — Encounter: Payer: Medicare Other | Admitting: Oncology

## 2012-11-26 ENCOUNTER — Telehealth: Payer: Self-pay | Admitting: Oncology

## 2012-11-26 NOTE — Progress Notes (Signed)
This encounter was created in error - please disregard.

## 2012-11-26 NOTE — Telephone Encounter (Signed)
S/w pt re appt for 3/17.

## 2012-12-06 ENCOUNTER — Ambulatory Visit (HOSPITAL_BASED_OUTPATIENT_CLINIC_OR_DEPARTMENT_OTHER): Payer: Medicare Other | Admitting: Oncology

## 2012-12-06 ENCOUNTER — Encounter: Payer: Self-pay | Admitting: Oncology

## 2012-12-06 ENCOUNTER — Telehealth: Payer: Self-pay | Admitting: Oncology

## 2012-12-06 VITALS — BP 106/63 | HR 58 | Temp 97.3°F | Resp 20 | Ht 73.0 in | Wt 186.2 lb

## 2012-12-06 DIAGNOSIS — E039 Hypothyroidism, unspecified: Secondary | ICD-10-CM

## 2012-12-06 DIAGNOSIS — K117 Disturbances of salivary secretion: Secondary | ICD-10-CM

## 2012-12-06 DIAGNOSIS — C109 Malignant neoplasm of oropharynx, unspecified: Secondary | ICD-10-CM | POA: Diagnosis not present

## 2012-12-06 DIAGNOSIS — R109 Unspecified abdominal pain: Secondary | ICD-10-CM

## 2012-12-06 MED ORDER — LEVOTHYROXINE SODIUM 25 MCG PO TABS: 25.0000 ug | ORAL_TABLET | Freq: Every day | ORAL | Status: DC

## 2012-12-06 NOTE — Progress Notes (Signed)
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  PAST THERAPY: weekly cisplatin/daily radiation between 04/2011 and 06/2011.   CURRENT THERAPY: watchful observation.   INTERVAL HISTORY: George Bradley 72 y.o. male returns for regular follow up by himself.  He reports that he still has dysphagia from radiation.  He underwent two esophageal dilation with some improvement; states he needs to have this done again. 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  Remains on Neurontin TID.  Xerostomia is persistent, but slightly better than last visit. He remains on Salagen TID.  He denied odynophagia, neck swelling. Continues to abdominal cramping despite use of Ativan. He is being seen at [facility] next week for his abdominal cramping.   Patient denies fever, anorexia, weight loss, fatigue, headache, visual changes, confusion, drenching night sweats, palpable lymph node swelling, mucositis, odynophagia, dysphagia, nausea vomiting, jaundice, chest pain, palpitation, shortness of breath, dyspnea on exertion, productive cough, gum bleeding, epistaxis, hematemesis, hemoptysis, abdominal pain, abdominal swelling, early satiety, melena, hematochezia, hematuria, skin rash, spontaneous bleeding, joint swelling, joint pain, heat or cold intolerance, bowel bladder incontinence, back pain, focal motor weakness, paresthesia, depression, suicidal or homicidal ideation, feeling hopelessness.   Past Medical History  Diagnosis Date  . Ischemic heart disease   . Aortic stenosis, mild   . Hyperlipidemia   . Insomnia   . Fatigue   . Dizziness   . Hypotension     orthostatic  . History of echocardiogram 02/09/2009    EF -  55-60%  . History of cardiovascular stress test  03/14/2009    EF - 57%   /  mild anterior hyperkinesia with discrete anterior scar consistent with previous infarction but unchanged from previous studies.  Overall, stable findings -- Colleen Can. Deborah Chalk, MD  . Weakness   . ASCVD (arteriosclerotic cardiovascular disease)   . Arrhythmia     known history of  . Weight loss, abnormal 2012  . 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 Protein-calorie malnutrition, moderate 2012    on PEG tube feeding  . Cellulitis of artificial external opening, post-operative 2012    of PEG tube site  . Mesenteric thrombosis   . 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 S/P radiation therapy 05/13/11-07/04/11    7000 cGy  Tongue Carcinoma    Past Surgical History  Procedure Laterality Date  . Coronary artery bypass graft  2000    x 4  . Cardiac catheterization  2007    EF - 55%  . Ablation of dysrhythmic focus    . Esophagogastroduodenoscopy  04/24/2011    Procedure: ESOPHAGOGASTRODUODENOSCOPY (EGD);  Surgeon: Malissa Hippo, MD;  Location: [facility];  Service: Endoscopy;  Laterality: N/A;  8:30 am  . Peg placement  04/24/2011    Procedure: PERCUTANEOUS ENDOSCOPIC GASTROSTOMY (PEG) PLACEMENT;  Surgeon: Malissa Hippo, MD;  Location: [facility];  Service: Endoscopy;  Laterality: N/A;  . Esophagogastroduodenoscopy (egd) with esophageal dilation  09/02/2012    Procedure: ESOPHAGOGASTRODUODENOSCOPY (EGD) WITH ESOPHAGEAL DILATION;  Surgeon: Malissa Hippo, MD;  Location: [facility];  Service: Endoscopy;  Laterality: N/A;  245  . Esophageal dilation      Current Outpatient Prescriptions  Medication Sig Dispense Refill  . aspirin EC 81 MG tablet Take 81 mg by mouth at bedtime.       Marland Kitchen  gabapentin (NEURONTIN) 100 MG capsule Take 300 mg by mouth 3 (three) times daily.       Marland Kitchen levothyroxine (LEVOTHROID) 25 MCG tablet Take 1 tablet (25 mcg total) by mouth daily.  30 tablet  5  . LORazepam (ATIVAN) 1 MG tablet Take 1 mg by mouth every 8 (eight) hours as needed.      .  pantoprazole (PROTONIX) 40 MG tablet Take 40 mg by mouth daily.       . pilocarpine (SALAGEN) 5 MG tablet Take 1 tablet (5 mg total) by mouth 3 (three) times daily.  90 tablet  5  . rosuvastatin (CRESTOR) 20 MG tablet Take 1 tablet (20 mg total) by mouth at bedtime.  90 tablet  3  . zolpidem (AMBIEN) 10 MG tablet Take 5-10 mg by mouth at bedtime as needed. For sleep.       No current facility-administered medications for this visit.    ALLERGIES:  has No Known Allergies.  REVIEW OF SYSTEMS:  The rest of the 14-point review of system was negative.   Filed Vitals:   12/06/12 0922  BP: 106/63  Pulse: 58  Temp: 97.3 F (36.3 C)  Resp: 20   Wt Readings from Last 3 Encounters:  12/06/12 186 lb 3.2 oz (84.46 kg)  09/09/12 180 lb 14.4 oz (82.056 kg)  09/02/12 176 lb (79.833 kg)   ECOG Performance status: 1  PHYSICAL EXAMINATION:   General:  Thin-appearing in no acute distress.  Eyes:  no scleral icterus.  ENT:  There were no oropharyngeal lesions.  Neck was without thyromegaly.  Lymphatics:  Negative cervical, supraclavicular or axillary adenopathy.  Respiratory: lungs were clear bilaterally without wheezing or crackles.  Cardiovascular:  Regular rate and rhythm, S1/S2, without murmur, rub or gallop.  There was no pedal edema.  GI:  abdomen was soft, flat, nontender, nondistended, without organomegaly.  Muscoloskeletal:  no spinal tenderness of palpation of vertebral spine.  Skin exam was without echymosis, petichae.  Neuro exam was nonfocal.  Patient was able to get on and off exam table without assistance.  Gait was normal.  Patient was alerted and oriented.  Attention was good.   Language was appropriate.  Mood was normal without depression.  Speech was not pressured.  Thought content was not tangential.    LABORATORY/RADIOLOGY DATA:  Lab Results  Component Value Date   WBC 5.1 11/25/2012   HGB 14.0 11/25/2012   HCT 40.6 11/25/2012   PLT 104* 11/25/2012   GLUCOSE 102* 11/25/2012   CHOL 161  02/20/2011   TRIG 101.0 02/20/2011   HDL 51.60 02/20/2011   LDLCALC 89 02/20/2011   ALKPHOS 35* 11/25/2012   ALT 20 11/25/2012   AST 24 11/25/2012   NA 141 11/25/2012   K 4.6 11/25/2012   CL 106 11/25/2012   CREATININE 1.3 11/25/2012   BUN 18.4 11/25/2012   CO2 25 11/25/2012   INR 1.07 11/11/2011   IMAGING:  *RADIOLOGY REPORT*  Clinical Data: 72 year old male with oral pharyngeal cancer.  Chemotherapy and radiation completed in 2012. Dry mouth and  dysphagia.  CT NECK WITH CONTRAST  Technique: Multidetector CT imaging of the neck was performed with  intravenous contrast.  Contrast: OMNIPAQUE IOHEXOL 300 MG/ML SOLN  Comparison: 03/31/2012 and earlier.  Findings: Stable thickening of the epiglottis and other post XRT  sequelae to the neck. Pharyngeal soft tissue contours are stable.  No pharyngeal mass identified. The larynx is stable.  Retropharyngeal space, parapharyngeal spaces, and sublingual space  are within normal limits. Stable submandibular and parotid glands.  Stable or further decreased residual right level II/level III soft  tissue at the site of the large 2012 malignant lymph nodes (series  3 image 53) currently measuring 5 mm short axis. No cervical  lymphadenopathy.  Major vascular structures in the neck are stable. There is  bilateral carotid bifurcation atherosclerosis. Bilateral ICA  siphon calcified atherosclerosis. Visible orbit soft tissues and  brain parenchyma appears stable and within normal limits.  Visualized paranasal sinuses and mastoids are clear. Stable  visualized osseous structures.  Thyroid remains normal. Negative visualized superior mediastinum  except for aortic arch and lesser great vessel atherosclerosis.  Stable mild radiation sequelae to the lung apices. Mild dependent  atelectasis in the upper lungs today.  IMPRESSION:  Stable and satisfactory post-therapy appearance of the neck.  Original Report Authenticated By: Erskine Speed, M.D.  ASSESSMENT  AND PLAN:    1. History of Oropharynx squamous cell carcinoma: no recurrence.  In remission.  He does not smoke cigarettes, chew tobacco, or drink EtOH.  2. Xerostomia: Continue Salagen. 3. Mild to moderate calorie protein malnutrition: much improved from prior.  He is on regular foods. 4. 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  He will f/u with GI.  5. Abdominal pain: Unclear etiology.  He is on Ativan which seems to help his abdominal wall spasm per GI. Due for another eval at [facility] next week. 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  He has been off of Coumadin for 9 months without problem.  The thrombosis occurred when he had PEG tube and on treatment for HNSCC.  Thus, there is no indication for chronic anticoagulation.  7. 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 Repeat TSH in 3 months. 8. Follow up: CT neck in about 6 months with visit the day after.  I advised him to f/u with Rad Onc and ENT as well.   The length of time of the encounter was 25 minutes. More than 50% of time was spent counseling and coordination of care.

## 2012-12-09 ENCOUNTER — Ambulatory Visit
Admission: RE | Admit: 2012-12-09 | Discharge: 2012-12-09 | Disposition: A | Discharge status: Home / Self Care | Payer: Medicare Other | Source: Ambulatory Visit | Attending: Radiation Oncology | Admitting: Radiation Oncology

## 2012-12-09 ENCOUNTER — Encounter: Payer: Self-pay | Admitting: Radiation Oncology

## 2012-12-09 VITALS — BP 144/81 | HR 59 | Temp 97.9°F | Resp 16 | Wt 187.9 lb

## 2012-12-09 DIAGNOSIS — C01 Malignant neoplasm of base of tongue: Secondary | ICD-10-CM

## 2012-12-09 NOTE — Progress Notes (Signed)
Patient presents to the clinic today unaccompanied for follow up with Dr. Roselind Messier. Patient alert and oriented to person, place, and time. No distress noted. Steady gait noted. Pleasant affect noted. Patient denies pain at this time. Patient reports difficulty swallowing. Patient reports that he plans to contact his doctor today to make an appointment for esophageal dilation. Patient reports dry mouth continues to affect taste. Patient reports that he is maintaining his weight by forcing himself to eat and drinking supplements. Patient denies pain associated with swallowing. Patient reports that his stomach hurts 100% of the time but, he is going to see a specialist next week reference this matter. Patient denies cough or shortness of breath. Patient reports that he exercises daily. Reported all finding to Dr. Roselind Messier.

## 2012-12-09 NOTE — Progress Notes (Signed)
Radiation Oncology         [phone] ________________________________  Name: George Bradley MRN: [id number]  Date: 12/09/2012  DOB: 1941/07/07  Follow-Up Visit Note  CC: Gwen Pounds, MD  Exie Parody, MD  Diagnosis:   Stage IVA squamous cell carcinoma of the right base of tongue    Interval Since Last Radiation:  One year and 5 months   Narrative:  The patient returns today for routine follow-up.  He continues to have problems with poor taste and xerostomia. These overall has slightly improved over the past 3 months.    He continues on Salagen for this issue. He has also noticed more problems with swallowing pills and is scheduled to undergo esophageal/ pharyngeal dilitation  in the near future by Dr Karilyn Cota.  He has also had problems with stomach the cramping and pain. He will be seen at [facility] in the near future concerning this issue.  He did undergo recent CT scan of the neck showing no evidence of recurrence.   The patient was recently found to be hypothyroid and will proceed with Synthroid for this issue should mention the patient has had some fatigue recently which may be related to this issue.                ALLERGIES:  has No Known Allergies.  Meds: Current Outpatient Prescriptions  Medication Sig Dispense Refill  . aspirin EC 81 MG tablet Take 81 mg by mouth at bedtime.       . gabapentin (NEURONTIN) 100 MG capsule Take 300 mg by mouth 3 (three) times daily.       Marland Kitchen levothyroxine (LEVOTHROID) 25 MCG tablet Take 1 tablet (25 mcg total) by mouth daily.  30 tablet  5  . LORazepam (ATIVAN) 1 MG tablet Take 1 mg by mouth every 8 (eight) hours as needed.      . pilocarpine (SALAGEN) 5 MG tablet Take 1 tablet (5 mg total) by mouth 3 (three) times daily.  90 tablet  5  . rosuvastatin (CRESTOR) 20 MG tablet Take 1 tablet (20 mg total) by mouth at bedtime.  90 tablet  3  . zolpidem (AMBIEN) 10 MG tablet Take 5-10 mg by mouth at bedtime as needed. For sleep.      .  pantoprazole (PROTONIX) 40 MG tablet Take 40 mg by mouth daily.        No current facility-administered medications for this encounter.    Physical Findings: The patient is in no acute distress. Patient is alert and oriented.  weight is 187 lb 14.4 oz (85.231 kg). His oral temperature is 97.9 F (36.6 C). His blood pressure is 144/81 and his pulse is 59. His respiration is 16. Marland Kitchen  No palpable cervical supraclavicular or axillary adenopathy. The lungs are clear to auscultation. The heart has a regular rhythm and rate. Examination of the oral cavity reveals mucosa to be mildly dry. There are no mucosal lesions noted the oral cavity or posterior pharynx. Patient proceeded to undergo an indirect mirror examination. A good view of the base of tongue was noted. There no mucosal lesions noted. The vallecula and larynx also was seen well. The vocal cords move well on exam. Palpation along the base of tongue and pharyngeal areas reveals no suspicious induration.  Lab Findings: Lab Results  Component Value Date   WBC 5.1 11/25/2012   HGB 14.0 11/25/2012   HCT 40.6 11/25/2012   MCV 96.0 11/25/2012   PLT 104* 11/25/2012    @  LASTCHEM@  Radiographic Findings: Ct Soft Tissue Neck W Contrast  11/25/2012  *RADIOLOGY REPORT*  Clinical Data: 72 year old male with oral pharyngeal cancer. Chemotherapy and radiation completed in 2012.  Dry mouth and dysphagia.  CT NECK WITH CONTRAST  Technique:  Multidetector CT imaging of the neck was performed with intravenous contrast.  Contrast: OMNIPAQUE IOHEXOL 300 MG/ML  SOLN  Comparison: 03/31/2012 and earlier.  Findings: Stable thickening of the epiglottis and other post XRT sequelae to the neck.  Pharyngeal soft tissue contours are stable. No pharyngeal mass identified.  The larynx is stable. Retropharyngeal space, parapharyngeal spaces, and sublingual space are within normal limits.  Stable submandibular and parotid glands.  Stable or further decreased residual right level  II/level III soft tissue at the site of the large 2012 malignant lymph nodes (series 3 image 53) currently measuring 5 mm short axis.  No cervical lymphadenopathy.  Major vascular structures in the neck are stable.  There is bilateral carotid bifurcation atherosclerosis.  Bilateral ICA siphon calcified atherosclerosis.  Visible orbit soft tissues and brain parenchyma appears stable and within normal limits. Visualized paranasal sinuses and mastoids are clear.  Stable visualized osseous structures.  Thyroid remains normal.  Negative visualized superior mediastinum except for aortic arch and lesser great vessel atherosclerosis. Stable mild radiation sequelae to the lung apices.  Mild dependent atelectasis in the upper lungs today.  IMPRESSION: Stable and satisfactory post-therapy appearance of the neck.   Original Report Authenticated By: Erskine Speed, M.D.     Impression:  The patient is slowly recovering from the effects of radiation.   No evidence of recurrence on clinical exam today  Plan:  Routine followup in 3 months.  _____________________________________  -----------------------------------  Billie Lade, PhD, MD

## 2012-12-13 ENCOUNTER — Other Ambulatory Visit: Payer: Self-pay | Admitting: Oncology

## 2012-12-13 DIAGNOSIS — R110 Nausea: Secondary | ICD-10-CM | POA: Diagnosis not present

## 2012-12-13 DIAGNOSIS — K5900 Constipation, unspecified: Secondary | ICD-10-CM | POA: Diagnosis not present

## 2012-12-13 DIAGNOSIS — R141 Gas pain: Secondary | ICD-10-CM | POA: Diagnosis not present

## 2012-12-13 DIAGNOSIS — R109 Unspecified abdominal pain: Secondary | ICD-10-CM | POA: Diagnosis not present

## 2012-12-13 DIAGNOSIS — K219 Gastro-esophageal reflux disease without esophagitis: Secondary | ICD-10-CM | POA: Diagnosis not present

## 2012-12-13 DIAGNOSIS — R143 Flatulence: Secondary | ICD-10-CM | POA: Diagnosis not present

## 2012-12-14 ENCOUNTER — Telehealth (INDEPENDENT_AMBULATORY_CARE_PROVIDER_SITE_OTHER): Payer: Self-pay | Admitting: *Deleted

## 2012-12-14 ENCOUNTER — Other Ambulatory Visit (INDEPENDENT_AMBULATORY_CARE_PROVIDER_SITE_OTHER): Payer: Self-pay | Admitting: *Deleted

## 2012-12-14 DIAGNOSIS — R1310 Dysphagia, unspecified: Secondary | ICD-10-CM

## 2012-12-14 NOTE — Telephone Encounter (Signed)
Can schedule patient for EGD and ED.

## 2012-12-14 NOTE — Telephone Encounter (Signed)
  Procedure: egd/ed       Ok per NUR Reason/Indication:  dysphagia  Has patient had this procedure before?  Yes, 08/2012  If so, when, by whom and where?    Is there a family history of colon cancer?    Who?  What age when diagnosed?    Is patient diabetic?   no      Does patient have prosthetic heart valve?  no  Do you have a pacemaker?  no  Has patient ever had endocarditis?   Has patient had joint replacement within last 12 months?  no  Is patient on Coumadin, Plavix and/or Aspirin? yes  Medications: see EPIC  Allergies: see EPIC  Medication Adjustment: asa 2 days  Procedure date & time: 12/24/12 at 855

## 2012-12-14 NOTE — Telephone Encounter (Signed)
EGD/ed sch'd 12/24/12 at 855, patient aware

## 2012-12-14 NOTE — Telephone Encounter (Signed)
agree

## 2012-12-14 NOTE — Telephone Encounter (Signed)
Patient's wife called, George Bradley is feeling like food is getting stuck and feels like he needs to go ahead be scheduled for EGD/ED, please advise

## 2012-12-15 ENCOUNTER — Encounter (HOSPITAL_COMMUNITY): Payer: Self-pay | Admitting: Pharmacy Technician

## 2012-12-20 ENCOUNTER — Other Ambulatory Visit (INDEPENDENT_AMBULATORY_CARE_PROVIDER_SITE_OTHER): Payer: Self-pay | Admitting: Internal Medicine

## 2012-12-20 DIAGNOSIS — E785 Hyperlipidemia, unspecified: Secondary | ICD-10-CM | POA: Diagnosis not present

## 2012-12-20 DIAGNOSIS — Z125 Encounter for screening for malignant neoplasm of prostate: Secondary | ICD-10-CM | POA: Diagnosis not present

## 2012-12-20 DIAGNOSIS — I2510 Atherosclerotic heart disease of native coronary artery without angina pectoris: Secondary | ICD-10-CM | POA: Diagnosis not present

## 2012-12-20 DIAGNOSIS — E079 Disorder of thyroid, unspecified: Secondary | ICD-10-CM | POA: Diagnosis not present

## 2012-12-24 ENCOUNTER — Ambulatory Visit (HOSPITAL_COMMUNITY)
Admission: RE | Admit: 2012-12-24 | Discharge: 2012-12-24 | Disposition: A | Discharge status: Home / Self Care | Payer: Medicare Other | Source: Ambulatory Visit | Attending: Internal Medicine | Admitting: Internal Medicine

## 2012-12-24 ENCOUNTER — Encounter (HOSPITAL_COMMUNITY)
Admission: RE | Disposition: A | Discharge status: Home / Self Care | Payer: Self-pay | Source: Ambulatory Visit | Attending: Internal Medicine

## 2012-12-24 ENCOUNTER — Encounter (HOSPITAL_COMMUNITY): Payer: Self-pay | Admitting: *Deleted

## 2012-12-24 DIAGNOSIS — R1310 Dysphagia, unspecified: Secondary | ICD-10-CM | POA: Insufficient documentation

## 2012-12-24 DIAGNOSIS — K2100 Gastro-esophageal reflux disease with esophagitis, without bleeding: Secondary | ICD-10-CM | POA: Diagnosis not present

## 2012-12-24 DIAGNOSIS — K222 Esophageal obstruction: Secondary | ICD-10-CM | POA: Insufficient documentation

## 2012-12-24 DIAGNOSIS — K219 Gastro-esophageal reflux disease without esophagitis: Secondary | ICD-10-CM

## 2012-12-24 HISTORY — PX: ESOPHAGOGASTRODUODENOSCOPY (EGD) WITH ESOPHAGEAL DILATION: SHX5812

## 2012-12-24 HISTORY — DX: Atherosclerotic heart disease of native coronary artery without angina pectoris: I25.10

## 2012-12-24 SURGERY — ESOPHAGOGASTRODUODENOSCOPY (EGD) WITH ESOPHAGEAL DILATION
Anesthesia: Moderate Sedation

## 2012-12-24 MED ORDER — MEPERIDINE HCL 50 MG/ML IJ SOLN
INTRAMUSCULAR | Status: AC
  Filled 2012-12-24: qty 1

## 2012-12-24 MED ORDER — MIDAZOLAM HCL 5 MG/5ML IJ SOLN
INTRAMUSCULAR | Status: AC
  Filled 2012-12-24: qty 10

## 2012-12-24 MED ORDER — STERILE WATER FOR IRRIGATION IR SOLN
Status: DC | PRN
  Administered 2012-12-24: 09:00:00

## 2012-12-24 MED ORDER — MEPERIDINE HCL 25 MG/ML IJ SOLN
INTRAMUSCULAR | Status: DC | PRN
  Administered 2012-12-24 (×2): 25 mg via INTRAVENOUS

## 2012-12-24 MED ORDER — BUTAMBEN-TETRACAINE-BENZOCAINE 2-2-14 % EX AERO
INHALATION_SPRAY | CUTANEOUS | Status: DC | PRN
  Administered 2012-12-24: 2 via TOPICAL

## 2012-12-24 MED ORDER — SODIUM CHLORIDE 0.9 % IV SOLN
INTRAVENOUS | Status: DC
  Administered 2012-12-24: 1000 mL via INTRAVENOUS

## 2012-12-24 MED ORDER — MIDAZOLAM HCL 5 MG/5ML IJ SOLN
INTRAMUSCULAR | Status: DC | PRN
  Administered 2012-12-24: 3 mg via INTRAVENOUS
  Administered 2012-12-24 (×2): 2 mg via INTRAVENOUS

## 2012-12-24 NOTE — H&P (Signed)
George Bradley is an 72 y.o. male.   Chief Complaint: Patient sent for EGD and ED. HPI: Patient is 72 year old Caucasian male with history of anoxia treated with chemoradiation and remains in remission. Rest of the proximal esophageal stricture to be secondary to radiation therapy. Was initially dilated twice in August 2013 and again in December 2013. He is starting to have difficulty swallowing solids. He is therefore returning for repeat EGD ED. He says he does not have abdominal pain as long as he takes lorazepam. He was seen [facility] is her second and was felt that his pain was either neuropathic or muscular pain. He has gained a couple pounds in the last 3 months.  Past Medical History  Diagnosis Date  . Ischemic heart disease   . Aortic stenosis, mild   . Hyperlipidemia   . Insomnia   . Fatigue   . Dizziness   . Hypotension     orthostatic  . History of echocardiogram 02/09/2009    EF -  55-60%  . History of cardiovascular stress test 03/14/2009    EF - 57%   /  mild anterior hyperkinesia with discrete anterior scar consistent with previous infarction but unchanged from previous studies.  Overall, stable findings -- Colleen Can. Deborah Chalk, MD  . Weakness   . ASCVD (arteriosclerotic cardiovascular disease)   . Arrhythmia     known history of  . Weight loss, abnormal 2012  . 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 Protein-calorie malnutrition, moderate 2012    on PEG tube feeding  . Cellulitis of artificial external opening, post-operative 2012    of PEG tube site  . Mesenteric thrombosis   . 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 S/P radiation therapy 05/13/11-07/04/11    7000 cGy  Tongue Carcinoma  . Coronary artery disease     Past Surgical History  Procedure Laterality Date  . Coronary artery bypass graft  2000    x 4  . Cardiac catheterization  2007    EF - 55%  . Ablation of dysrhythmic focus    . Esophagogastroduodenoscopy  04/24/2011    Procedure: ESOPHAGOGASTRODUODENOSCOPY (EGD);  Surgeon: Malissa Hippo, MD;  Location: [facility];  Service: Endoscopy;  Laterality: N/A;  8:30 am  . Peg placement  04/24/2011    Procedure: PERCUTANEOUS ENDOSCOPIC GASTROSTOMY (PEG) PLACEMENT;  Surgeon: Malissa Hippo, MD;  Location: [facility];  Service: Endoscopy;  Laterality: N/A;  . Esophagogastroduodenoscopy (egd) with esophageal dilation  09/02/2012    Procedure: ESOPHAGOGASTRODUODENOSCOPY (EGD) WITH ESOPHAGEAL DILATION;  Surgeon: Malissa Hippo, MD;  Location: [facility];  Service: Endoscopy;  Laterality: N/A;  245  . Esophageal dilation      Family History  Problem Relation Age of Onset  . Heart disease Father   . Heart disease Brother   . Heart disease Sister   . Cancer Sister     breast cancer  . Cancer Mother     breast ca  . Hyperlipidemia Son    Social History:  reports that he quit smoking about 3 years ago. His smoking use included Cigars. He quit smokeless tobacco use about 19 months ago. He reports that he does not drink alcohol or use illicit drugs.  Allergies: No Known Allergies  Medications Prior to Admission  Medication Sig Dispense Refill  . aspirin EC 81 MG tablet Take 81 mg by mouth at bedtime.       . gabapentin (NEURONTIN) 100 MG capsule Take 300 mg by mouth 3 (three)  times daily.       Marland Kitchen levothyroxine (LEVOTHROID) 25 MCG tablet Take 1 tablet (25 mcg total) by mouth daily.  30 tablet  5  . LORazepam (ATIVAN) 1 MG tablet TAKE ONE TABLET BY MOUTH EVERY 8 HOURS AS NEEDED FOR ANXIETY  90 tablet  2  . pantoprazole (PROTONIX) 40 MG tablet Take 40 mg by mouth daily.       . pilocarpine (SALAGEN) 5 MG tablet TAKE ONE TABLET BY MOUTH THREE TIMES DAILY  90 tablet  0  . rosuvastatin (CRESTOR) 20 MG tablet Take 1 tablet (20 mg total) by mouth at bedtime.  90 tablet  3  . zolpidem (AMBIEN) 10 MG tablet Take 5-10 mg by mouth at bedtime as needed. For sleep.        No results found for this or any previous visit (from the past 48 hour(s)). No results found.  ROS  Blood  pressure 136/72, pulse 63, temperature 97.7 F (36.5 C), temperature source Oral, resp. rate 17, SpO2 97.00%. Physical Exam  Constitutional:  Well-developed thin Caucasian male in NAD  HENT:  Mouth/Throat: Oropharynx is clear and moist.  Eyes: Conjunctivae are normal. No scleral icterus.  Neck: No thyromegaly present.  Cardiovascular: Normal rate, regular rhythm and normal heart sounds.   No murmur heard. Respiratory: Effort normal and breath sounds normal.  GI: Soft. He exhibits no distension and no mass. There is no tenderness.  Musculoskeletal: He exhibits no edema.  Lymphadenopathy:    He has no cervical adenopathy.  Neurological: He is alert.  Skin: Skin is warm and dry.     Assessment/Plan Dysphagia secondary to proximal esophageal stricture. EGD with ED.  [name],[name] 12/24/2012, 8:44 AM

## 2012-12-24 NOTE — Op Note (Signed)
EGD PROCEDURE REPORT  PATIENT:  George Bradley  MR#:  [id number] Birthdate:  1941-02-02, 72 y.o., male Endoscopist:  Dr. Malissa Hippo, MD Referred By:  Dr. Gwen Pounds, MD  Procedure Date: 12/24/2012  Procedure:   EGD with ED.  Indications:  Patient is 72 year old Caucasian male was stricture proximal esophageal stricture secondary to radiation therapy who was last dilated in December 2013 presents with recurrent solid food dysphagia. Esophageal stricture was dilated to 41 [place] on his last exam.            Informed Consent:  The risks, benefits, alternatives & imponderables which include, but are not limited to, bleeding, infection, perforation, drug reaction and potential missed lesion have been reviewed.  The potential for biopsy, lesion removal, esophageal dilation, etc. have also been discussed.  Questions have been answered.  All parties agreeable.  Please see history & physical in medical record for more information.  Medications:  Demerol 50 mg IV Versed 7 mg IV Cetacaine spray topically for oropharyngeal anesthesia  Description of procedure:  The endoscope was introduced through the mouth and advanced to the second portion of the duodenum without difficulty or limitations. The mucosal surfaces were surveyed very carefully during advancement of the scope and upon withdrawal.  Findings:  Esophagus:  Stricture noted involving the proximal segment with luminal narrowing in pale mucosa. No mass or erosions are noted. Mucosa at body was normal. Focal erythema edema at GE junction without ring or stricture formation. GEJ:  41 cm Stomach:  Stomach was empty and distended very well with insufflation. Folds in the proximal stomach are normal. Examination mucosa at body, antrum, pyloric channel, and the nurse fundus and cardia was normal. Duodenum:  Normal bulbar and post bulbar mucosa.  Therapeutic/Diagnostic Maneuvers Performed:   Esophagus dilated by passing 54 French Maloney dilator  to full insertion. Esophageal mucosa was reexamined post dilation and superficial mucosal disruption was noted from 17-19 cm from the incisors.  Complications:  None  Impression: Proximal esophageal stricture dilated up passing 54 French Maloney dilator. 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  Recommendations:  Soft foods for 2 days. Continue antireflux measures and PPI as before.  Caz Weaver U  12/24/2012  9:08 AM  CC: Dr. Gwen Pounds, MD & Dr. Bonnetta Barry ref. provider found

## 2012-12-27 ENCOUNTER — Encounter (HOSPITAL_COMMUNITY): Payer: Self-pay | Admitting: Internal Medicine

## 2012-12-27 DIAGNOSIS — Z125 Encounter for screening for malignant neoplasm of prostate: Secondary | ICD-10-CM | POA: Diagnosis not present

## 2012-12-27 DIAGNOSIS — C029 Malignant neoplasm of tongue, unspecified: Secondary | ICD-10-CM | POA: Diagnosis not present

## 2012-12-27 DIAGNOSIS — Z0000 Encounter for general adult medical examination without abnormal findings: Secondary | ICD-10-CM | POA: Diagnosis not present

## 2012-12-27 DIAGNOSIS — E039 Hypothyroidism, unspecified: Secondary | ICD-10-CM | POA: Diagnosis not present

## 2012-12-27 DIAGNOSIS — E079 Disorder of thyroid, unspecified: Secondary | ICD-10-CM | POA: Diagnosis not present

## 2012-12-27 DIAGNOSIS — R5381 Other malaise: Secondary | ICD-10-CM | POA: Diagnosis not present

## 2012-12-27 DIAGNOSIS — R1319 Other dysphagia: Secondary | ICD-10-CM | POA: Diagnosis not present

## 2012-12-27 DIAGNOSIS — G589 Mononeuropathy, unspecified: Secondary | ICD-10-CM | POA: Diagnosis not present

## 2012-12-30 DIAGNOSIS — Z1212 Encounter for screening for malignant neoplasm of rectum: Secondary | ICD-10-CM | POA: Diagnosis not present

## 2013-01-15 ENCOUNTER — Other Ambulatory Visit: Payer: Self-pay | Admitting: Oncology

## 2013-01-19 DIAGNOSIS — R110 Nausea: Secondary | ICD-10-CM | POA: Diagnosis not present

## 2013-01-19 DIAGNOSIS — R141 Gas pain: Secondary | ICD-10-CM | POA: Diagnosis not present

## 2013-01-19 DIAGNOSIS — R109 Unspecified abdominal pain: Secondary | ICD-10-CM | POA: Diagnosis not present

## 2013-01-19 DIAGNOSIS — R143 Flatulence: Secondary | ICD-10-CM | POA: Diagnosis not present

## 2013-01-27 ENCOUNTER — Encounter: Payer: Self-pay | Admitting: Cardiology

## 2013-01-27 ENCOUNTER — Encounter (INDEPENDENT_AMBULATORY_CARE_PROVIDER_SITE_OTHER): Payer: Self-pay | Admitting: *Deleted

## 2013-02-15 ENCOUNTER — Encounter: Payer: Self-pay | Admitting: Cardiology

## 2013-02-15 ENCOUNTER — Ambulatory Visit (INDEPENDENT_AMBULATORY_CARE_PROVIDER_SITE_OTHER): Payer: Medicare Other | Admitting: Cardiology

## 2013-02-15 ENCOUNTER — Other Ambulatory Visit: Payer: Self-pay | Admitting: Oncology

## 2013-02-15 VITALS — BP 110/70 | HR 58 | Ht 73.0 in | Wt 180.0 lb

## 2013-02-15 DIAGNOSIS — E7800 Pure hypercholesterolemia, unspecified: Secondary | ICD-10-CM

## 2013-02-15 DIAGNOSIS — I951 Orthostatic hypotension: Secondary | ICD-10-CM | POA: Diagnosis not present

## 2013-02-15 DIAGNOSIS — I259 Chronic ischemic heart disease, unspecified: Secondary | ICD-10-CM | POA: Diagnosis not present

## 2013-02-15 NOTE — Patient Instructions (Addendum)
Continue your current therapy  I will see you in one year   

## 2013-02-15 NOTE — Progress Notes (Signed)
Subjective:   George Bradley comes in today for followup visit. He had coronary artery bypass grafting in 2000. His last catheterization was in 2007 and showed patent saphenous vein grafts x3 with a patent left internal mammary artery and well-preserved LV function. His EF is 55%. His last nuclear stress test in July of 2012 showed a small fixed apical defect with normal ejection fraction. He has a history of orthostatic hypotension and some lightheadedness on occasion. He is on no antihypertensive therapy. He's had a history of what sounds like a Maze procedure when he lived in [place]. His other problems include hyperlipidemia, insomnia, and a history of depression. On followup today he is doing very well. He still has some episodic dizziness but has learned to deal with it. He really only gets in trouble if he tries to get up too quickly. He denies any chest pain or shortness of breath. He's had no increase in edema. He denies any palpitations. He reports that he is cancer free now.  Current Outpatient Prescriptions  Medication Sig Dispense Refill  . aspirin EC 81 MG tablet Take 81 mg by mouth at bedtime.       . gabapentin (NEURONTIN) 100 MG capsule Take 300 mg by mouth 3 (three) times daily.       Marland Kitchen levothyroxine (LEVOTHROID) 25 MCG tablet Take 1 tablet (25 mcg total) by mouth daily.  30 tablet  5  . LORazepam (ATIVAN) 1 MG tablet TAKE ONE TABLET BY MOUTH EVERY 8 HOURS AS NEEDED FOR ANXIETY  90 tablet  2  . pantoprazole (PROTONIX) 40 MG tablet Take 40 mg by mouth daily.       . pilocarpine (SALAGEN) 5 MG tablet TAKE ONE TABLET BY MOUTH THREE TIMES DAILY  90 tablet  0  . rosuvastatin (CRESTOR) 20 MG tablet Take 1 tablet (20 mg total) by mouth at bedtime.  90 tablet  3  . zolpidem (AMBIEN) 10 MG tablet Take 5-10 mg by mouth at bedtime as needed. For sleep.       No current facility-administered medications for this visit.    No Known Allergies  Patient Active Problem List   Diagnosis Date  Noted  . 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 Orthostatic hypotension 05/11/2012  . Epigastric pain 05/11/2012  . Cancer of base of tongue 11/17/2011  . Depression 10/01/2011  . 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 Abdominal pain, acute, periumbilical 09/03/2011  . 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 Protein-calorie malnutrition, moderate   . Weight loss, abnormal   . 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 Ischemic heart disease 02/20/2011  . Hypercholesterolemia 02/20/2011  . Aortic stenosis 02/20/2011    History  Smoking status  . Former Smoker  . Types: Cigars  . Quit date: 09/21/2009  Smokeless tobacco  . Former Neurosurgeon  . Quit date: 05/06/2011    Comment: 2 cigars a week    History  Alcohol Use No    Comment: occasionally glass of wine    Family History  Problem Relation Age of Onset  . Heart disease Father   . Heart disease Brother   . Heart disease Sister   . Cancer Sister     breast cancer  . Cancer Mother     breast ca  . Hyperlipidemia Son     Review of Systems:   As noted in history of present illness.  All other systems were reviewed and are negative.   Physical Exam:    BP 110/70  Pulse 58  Ht 6\' 1"  (1.854 m)  Wt 180 lb (81.647 kg)  BMI 23.75 kg/m2   The head is normocephalic and atraumatic.  Pupils are equally round and reactive to light.  Sclerae nonicteric.  Conjunctiva is clear.  Oropharynx is clear.   Neck is supple there are no masses.  Thyroid is not enlarged.  There is no lymphadenopathy.  Lungs are clear.  Chest is symmetric.  Heart shows a regular rate and rhythm.  S1 and S2 are normal.  There is a soft systolic outflow murmur.  Abdomen is soft normal bowel sounds. There is no organomegaly.   Extremities are without edema.  Peripheral pulses are adequate.  Neurologically intact.  Skin is warm and dry.  Laboratory data:  ECG demonstrates normal sinus rhythm with a rate of 58 beats per minute. He has a right bundle branch block  and left anterior fascicular block.  Assessment / Plan:  1. Orthostatic hypotension. This has been a chronic issue. Patient is managing well with conservative measures.  2.  Coronary disease status post CABG. Clinically asymptomatic. Continue risk factor modification.  3. 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

## 2013-03-01 ENCOUNTER — Ambulatory Visit (INDEPENDENT_AMBULATORY_CARE_PROVIDER_SITE_OTHER): Payer: Medicare Other | Admitting: Internal Medicine

## 2013-03-01 ENCOUNTER — Encounter (INDEPENDENT_AMBULATORY_CARE_PROVIDER_SITE_OTHER): Payer: Self-pay | Admitting: Internal Medicine

## 2013-03-01 VITALS — BP 128/70 | HR 72 | Temp 98.7°F | Resp 18 | Ht 73.0 in | Wt 181.2 lb

## 2013-03-01 DIAGNOSIS — R109 Unspecified abdominal pain: Secondary | ICD-10-CM

## 2013-03-01 DIAGNOSIS — K5900 Constipation, unspecified: Secondary | ICD-10-CM | POA: Diagnosis not present

## 2013-03-01 DIAGNOSIS — K222 Esophageal obstruction: Secondary | ICD-10-CM | POA: Diagnosis not present

## 2013-03-01 MED ORDER — LORAZEPAM 1 MG PO TABS: 1.0000 mg | ORAL_TABLET | Freq: Three times a day (TID) | ORAL | Status: DC

## 2013-03-01 MED ORDER — LINACLOTIDE 145 MCG PO CAPS: 145.0000 ug | ORAL_CAPSULE | Freq: Every day | ORAL | Status: DC

## 2013-03-01 MED ORDER — LIDOCAINE 5 % EX PTCH: 1.0000 | MEDICATED_PATCH | CUTANEOUS | Status: DC

## 2013-03-01 NOTE — Progress Notes (Signed)
Presenting complaint;  Abdominal pain and constipation. Swallowing difficulty.  Subjective:  Patient is 72 year old Caucasian male who is here for scheduled visit. He was last seen in the office in November 2013 but he underwent EGD with EGD on 12/24/2012. He has history of radiation induced proximal esophageal stricture and has undergone 5 dilations 3 of which were in August 2013. Last dilation was on 12/24/2012 as above. He has noted some difficulty in swallowing solids but he does not feel he's reached the point where his esophagus needs to be stretched again. His heartburn is well controlled. His appetite is better. He has gained 5 pounds since his last visit. He continues to complain of bilateral upper abdominal pain. He has history of SMV thrombosis which cleared with anti-coagulation. He was referred to [facility] for further evaluation. He was given prescription for hyoscyamine which has not helped. Similarly lorazepam is not helping anymore. He describes his pain to be unrelenting constant pain more intense at times. It is fairly localized. He denies nausea vomiting or diarrhea. In fact he is chronically constipated and his bowels are not moving as he takes a laxative. He has read about Linzess and he wants to try this medication.  Current Medications: Current Outpatient Prescriptions  Medication Sig Dispense Refill  . aspirin EC 81 MG tablet Take 81 mg by mouth at bedtime.       . gabapentin (NEURONTIN) 100 MG capsule Take 300 mg by mouth 3 (three) times daily.       Marland Kitchen HYOSCYAMINE SULFATE PO Take 0.125 mg by mouth as needed.      Marland Kitchen levothyroxine (SYNTHROID, LEVOTHROID) 25 MCG tablet Take 50 mcg by mouth daily.      Marland Kitchen LORazepam (ATIVAN) 1 MG tablet TAKE ONE TABLET BY MOUTH EVERY 8 HOURS AS NEEDED FOR ANXIETY  90 tablet  2  . pantoprazole (PROTONIX) 40 MG tablet Take 40 mg by mouth daily.       . pilocarpine (SALAGEN) 5 MG tablet TAKE ONE TABLET BY MOUTH THREE TIMES DAILY  90 tablet  0  .  rosuvastatin (CRESTOR) 20 MG tablet Take 1 tablet (20 mg total) by mouth at bedtime.  90 tablet  3  . zolpidem (AMBIEN) 10 MG tablet Take 5-10 mg by mouth at bedtime as needed. For sleep.       No current facility-administered medications for this visit.     Objective: Blood pressure 128/70, pulse 72, temperature 98.7 F (37.1 C), temperature source Oral, resp. rate 18, height 6\' 1"  (1.854 m), weight 181 lb 3.2 oz (82.192 kg). Patient is alert and in no acute distress Conjunctiva is pink. Sclera is nonicteric Oropharyngeal mucosa is normal. No neck masses or thyromegaly noted. Cardiac exam with regular rhythm normal S1 and S2. No murmur or gallop noted. Lungs are clear to auscultation. Abdomen is symmetrical. Bowel sounds are normal and no bruits noted. It is soft and nontender without organomegaly or masses.  No LE edema or clubbing noted.    Assessment:  #1. Chronic abdominal pain. Extensive workup has been negative. Lorazepam is not working anymore. This pain in my opinion has nothing to do with the gastrostomy tube which  has been removed and there is no evidence of metastatic disease. Reassuring to know that he has gained 5 pounds since his last visit. I suspect this is neuropathic pain and he would benefit from evaluation at pain clinic. In the meantime we'll try him on lidocaine patches. #2. Chronic constipation. Last colonoscopy was over  10 years ago. #3. Esophageal stricture. He is doing well. If dysphagia worsens he will be scheduled for esophageal dilation.    Plan:  Lidoderm patch to be used as directed. New prescription for lorazepam given. Referable to pain clinic. Linzess 145 mcg by mouth daily. Will schedule screening colonoscopy. Office visit in 6 months.

## 2013-03-01 NOTE — Patient Instructions (Signed)
Use Lidoderm patch as directed. Referable to pain clinic to be arranged. Call if you have swallowing difficulty.

## 2013-03-03 ENCOUNTER — Telehealth (INDEPENDENT_AMBULATORY_CARE_PROVIDER_SITE_OTHER): Payer: Self-pay | Admitting: *Deleted

## 2013-03-03 NOTE — Telephone Encounter (Signed)
Patient called in leaving a message returning Tammy's call.  She had questions about other medications that he had tried and he said he had tried everything OTC with results.  He is trying Linzess 145 mcg right now for last 2 days but with no results.  Dr. Karilyn Cota had mentioned he may have to try higher dose if this didn't work.  He would like to try samples if possible.  I think he said you were working on a Ins issue with this medication trying to get approved.    He can be reached at [phone]  Thanks

## 2013-03-03 NOTE — Telephone Encounter (Signed)
I have talked with Dr.[name] and he states that the patient needs to take the Linzess 145 mcg 1 by mouth daily for 2 weeks. The patient was called and made aware.

## 2013-03-03 NOTE — Telephone Encounter (Signed)
When patient was called he ask if he can take another laxitive with this as he does not want to go 2 weeks with no BM. Dr.[name] states that he may use what he was using before but no more that twice a week with the Linzess. George Bradley was called and made aware.

## 2013-03-03 NOTE — Telephone Encounter (Signed)
I have talked with the Mr.Taunton. He has tried all OTC medications and the Lactulose I will make Victorino Dike with [facility] aware. I will also make Dr.[name] aware that the Linzess 145 mcg have not worked for the patient . He has only taken 1 by mouth the last two days with no results. Patient advised that I would let him know what Dr.[name]'s recommendations are.

## 2013-03-04 ENCOUNTER — Encounter: Payer: Self-pay | Admitting: Radiation Oncology

## 2013-03-04 DIAGNOSIS — Z923 Personal history of irradiation: Secondary | ICD-10-CM | POA: Insufficient documentation

## 2013-03-08 ENCOUNTER — Other Ambulatory Visit: Payer: Medicare Other

## 2013-03-08 DIAGNOSIS — E039 Hypothyroidism, unspecified: Secondary | ICD-10-CM

## 2013-03-08 DIAGNOSIS — C109 Malignant neoplasm of oropharynx, unspecified: Secondary | ICD-10-CM

## 2013-03-10 ENCOUNTER — Encounter: Payer: Self-pay | Admitting: Radiation Oncology

## 2013-03-10 ENCOUNTER — Ambulatory Visit
Admission: RE | Admit: 2013-03-10 | Discharge: 2013-03-10 | Disposition: A | Discharge status: Home / Self Care | Payer: Medicare Other | Source: Ambulatory Visit | Attending: Radiation Oncology | Admitting: Radiation Oncology

## 2013-03-10 VITALS — BP 121/84 | HR 67 | Temp 97.8°F | Resp 20 | Wt 180.7 lb

## 2013-03-10 DIAGNOSIS — C01 Malignant neoplasm of base of tongue: Secondary | ICD-10-CM

## 2013-03-10 NOTE — Progress Notes (Signed)
Follow up s/p rad txs squamous cell tongue, 05/13/11-07/04/11, patient still has trouble swallowing food/fluids, said he will need his esophagus stretched  Again soon, c/o still having stomach pains, since his stomach was moved, lidocaine patch not helping, constipation still an issue, dry mouth still, slight fatigue 11:31 AM

## 2013-03-10 NOTE — Progress Notes (Signed)
Radiation Oncology         [phone] ________________________________  Name: George Bradley MRN: [id number]  Date: 03/10/2013  DOB: 1941-01-03  Follow-Up Visit Note  CC: Gwen Pounds, MD  Exie Parody, MD  Diagnosis:   Stage IV a squamous cell carcinoma of the right base of tongue  Interval Since Last Radiation:  One year and 8 months   Narrative:  The patient returns today for routine follow-up.  He continues to have some xerostomia. His taste has slowly improved over the past few months. Continues to have chronic abdominal pain and has had extensive workup for this issue at [facility].  He did see Dr. Karilyn Cota recently and with the patient's abdominal pain and constipation was placed on Linzess. Patient denies any blood-tinged sputum or pain with swallowing. He denies any otalgia.  His energy level is good and he is playing golf on a regular basis. He is not ready for an esophageal dilation per Dr. Patty Sermons note on June 10.                      ALLERGIES:  has No Known Allergies.  Meds: Current Outpatient Prescriptions  Medication Sig Dispense Refill  . aspirin EC 81 MG tablet Take 81 mg by mouth at bedtime.       . docusate sodium (COLACE) 100 MG capsule Take 100 mg by mouth 2 (two) times daily.      Marland Kitchen gabapentin (NEURONTIN) 100 MG capsule Take 300 mg by mouth 3 (three) times daily.       Marland Kitchen HYOSCYAMINE SULFATE PO Take 0.125 mg by mouth as needed.      Marland Kitchen levothyroxine (SYNTHROID, LEVOTHROID) 25 MCG tablet Take 50 mcg by mouth daily.      . Linaclotide (LINZESS) 145 MCG CAPS Take 1 capsule (145 mcg total) by mouth daily.  30 capsule  5  . LORazepam (ATIVAN) 1 MG tablet Take 1 tablet (1 mg total) by mouth every 8 (eight) hours.  90 tablet  2  . pantoprazole (PROTONIX) 40 MG tablet Take 40 mg by mouth daily.       . pilocarpine (SALAGEN) 5 MG tablet TAKE ONE TABLET BY MOUTH THREE TIMES DAILY  90 tablet  0  . polyethylene glycol (MIRALAX / GLYCOLAX) packet Take 17 g by mouth.      .  rosuvastatin (CRESTOR) 20 MG tablet Take 1 tablet (20 mg total) by mouth at bedtime.  90 tablet  3  . zolpidem (AMBIEN) 10 MG tablet Take 5-10 mg by mouth at bedtime as needed. For sleep.      Marland Kitchen lidocaine (LIDODERM) 5 % Place 1 patch onto the skin daily. Remove & Discard patch within 12 hours or as directed by MD  30 patch  2   No current facility-administered medications for this encounter.    Physical Findings: The patient is in no acute distress. Patient is alert and oriented.  weight is 180 lb 11.2 oz (81.965 kg). His oral temperature is 97.8 F (36.6 C). His blood pressure is 121/84 and his pulse is 67. His respiration is 20 and oxygen saturation is 98%. . No palpable cervical supraclavicular or axillary adenopathy. The lungs are clear to auscultation. The heart has a regular rhythm and rate. The oral cavity reveals the mucosa beam mildly dry. There no mucosal lesions noted in the oral cavity or posterior pharynx. There is no secondary infection. The patient proceeded to undergo a indirect mirror examination.  A good view of the base of tongue and vallecula was obtained. There are no mucosal lesions noted. The vocal cords moved well on exam. Palpation along the base of tongue area reveals no suspicious induration.  Lab Findings: Lab Results  Component Value Date   WBC 5.1 11/25/2012   HGB 14.0 11/25/2012   HCT 40.6 11/25/2012   MCV 96.0 11/25/2012   PLT 104* 11/25/2012      Radiographic Findings: No results found.  Impression:  No evidence of recurrence on clinical exam today  Plan:  Routine followup in 3 months.  _____________________________________  -----------------------------------  Billie Lade, PhD, MD

## 2013-03-20 ENCOUNTER — Other Ambulatory Visit: Payer: Self-pay | Admitting: Oncology

## 2013-03-21 ENCOUNTER — Telehealth (INDEPENDENT_AMBULATORY_CARE_PROVIDER_SITE_OTHER): Payer: Self-pay | Admitting: *Deleted

## 2013-03-21 ENCOUNTER — Other Ambulatory Visit (INDEPENDENT_AMBULATORY_CARE_PROVIDER_SITE_OTHER): Payer: Self-pay | Admitting: *Deleted

## 2013-03-21 DIAGNOSIS — K222 Esophageal obstruction: Secondary | ICD-10-CM

## 2013-03-21 DIAGNOSIS — R1310 Dysphagia, unspecified: Secondary | ICD-10-CM

## 2013-03-21 DIAGNOSIS — K5900 Constipation, unspecified: Secondary | ICD-10-CM | POA: Diagnosis not present

## 2013-03-21 DIAGNOSIS — Z1211 Encounter for screening for malignant neoplasm of colon: Secondary | ICD-10-CM

## 2013-03-21 DIAGNOSIS — R109 Unspecified abdominal pain: Secondary | ICD-10-CM | POA: Diagnosis not present

## 2013-03-21 DIAGNOSIS — K219 Gastro-esophageal reflux disease without esophagitis: Secondary | ICD-10-CM | POA: Diagnosis not present

## 2013-03-21 MED ORDER — PEG-KCL-NACL-NASULF-NA ASC-C 100 G PO SOLR: 1.0000 | Freq: Once | ORAL | Status: DC

## 2013-03-21 NOTE — Telephone Encounter (Signed)
agree

## 2013-03-21 NOTE — Telephone Encounter (Signed)
  Procedure: tcs/egd/ed  Reason/Indication:  Screening, esophageal stricture, dysphagia  Has patient had this procedure before? TCS yes 12-15 yrs ago, EGD/ED yes 12/2012  If so, when, by whom and where?    Is there a family history of colon cancer?  no  Who?  What age when diagnosed?    Is patient diabetic?   no      Does patient have prosthetic heart valve?  no  Do you have a pacemaker?  no  Has patient ever had endocarditis? no  Has patient had joint replacement within last 12 months?  no  Is patient on Coumadin, Plavix and/or Aspirin? yes  Medications: see EPIC  Allergies: see EPIC  Medication Adjustment: asa 2 days  Procedure date & time: 04/14/13 at 145

## 2013-04-01 MED ORDER — BACITRACIN 50000 UNITS IM SOLR
INTRAMUSCULAR | Status: AC
  Filled 2013-04-01: qty 1

## 2013-04-07 ENCOUNTER — Encounter (HOSPITAL_COMMUNITY): Payer: Self-pay | Admitting: Pharmacy Technician

## 2013-04-14 ENCOUNTER — Ambulatory Visit (HOSPITAL_COMMUNITY)
Admission: RE | Admit: 2013-04-14 | Discharge: 2013-04-14 | Disposition: A | Discharge status: Home / Self Care | Payer: Medicare Other | Source: Ambulatory Visit | Attending: Internal Medicine | Admitting: Internal Medicine

## 2013-04-14 ENCOUNTER — Encounter (HOSPITAL_COMMUNITY): Payer: Self-pay | Admitting: *Deleted

## 2013-04-14 ENCOUNTER — Encounter (HOSPITAL_COMMUNITY)
Admission: RE | Disposition: A | Discharge status: Home / Self Care | Payer: Self-pay | Source: Ambulatory Visit | Attending: Internal Medicine

## 2013-04-14 DIAGNOSIS — K644 Residual hemorrhoidal skin tags: Secondary | ICD-10-CM

## 2013-04-14 DIAGNOSIS — Z1211 Encounter for screening for malignant neoplasm of colon: Secondary | ICD-10-CM

## 2013-04-14 DIAGNOSIS — K449 Diaphragmatic hernia without obstruction or gangrene: Secondary | ICD-10-CM

## 2013-04-14 DIAGNOSIS — D126 Benign neoplasm of colon, unspecified: Secondary | ICD-10-CM | POA: Insufficient documentation

## 2013-04-14 DIAGNOSIS — K222 Esophageal obstruction: Secondary | ICD-10-CM | POA: Insufficient documentation

## 2013-04-14 DIAGNOSIS — R1310 Dysphagia, unspecified: Secondary | ICD-10-CM | POA: Diagnosis not present

## 2013-04-14 HISTORY — PX: COLONOSCOPY WITH ESOPHAGOGASTRODUODENOSCOPY (EGD): SHX5779

## 2013-04-14 HISTORY — PX: SAVORY DILATION: SHX5439

## 2013-04-14 HISTORY — PX: BALLOON DILATION: SHX5330

## 2013-04-14 HISTORY — PX: MALONEY DILATION: SHX5535

## 2013-04-14 SURGERY — COLONOSCOPY WITH ESOPHAGOGASTRODUODENOSCOPY (EGD)
Anesthesia: Moderate Sedation

## 2013-04-14 MED ORDER — MIDAZOLAM HCL 5 MG/5ML IJ SOLN
INTRAMUSCULAR | Status: AC
  Filled 2013-04-14: qty 10

## 2013-04-14 MED ORDER — MEPERIDINE HCL 50 MG/ML IJ SOLN
INTRAMUSCULAR | Status: DC | PRN
Start: 2013-04-14 — End: 2013-04-14
  Administered 2013-04-14 (×2): 25 mg via INTRAVENOUS

## 2013-04-14 MED ORDER — SODIUM CHLORIDE 0.9 % IV SOLN
INTRAVENOUS | Status: DC
  Administered 2013-04-14: 13:00:00 via INTRAVENOUS

## 2013-04-14 MED ORDER — STERILE WATER FOR IRRIGATION IR SOLN
Status: DC | PRN
  Administered 2013-04-14: 13:00:00

## 2013-04-14 MED ORDER — MIDAZOLAM HCL 5 MG/5ML IJ SOLN
INTRAMUSCULAR | Status: AC
  Filled 2013-04-14: qty 5

## 2013-04-14 MED ORDER — BUTAMBEN-TETRACAINE-BENZOCAINE 2-2-14 % EX AERO
INHALATION_SPRAY | CUTANEOUS | Status: DC | PRN
  Administered 2013-04-14: 2 via TOPICAL

## 2013-04-14 MED ORDER — MIDAZOLAM HCL 5 MG/5ML IJ SOLN
INTRAMUSCULAR | Status: DC | PRN
  Administered 2013-04-14 (×2): 2 mg via INTRAVENOUS
  Administered 2013-04-14: 1 mg via INTRAVENOUS
  Administered 2013-04-14: 2 mg via INTRAVENOUS
  Administered 2013-04-14: 3 mg via INTRAVENOUS
  Administered 2013-04-14: 2 mg via INTRAVENOUS

## 2013-04-14 MED ORDER — MEPERIDINE HCL 50 MG/ML IJ SOLN
INTRAMUSCULAR | Status: AC
  Filled 2013-04-14: qty 1

## 2013-04-14 NOTE — Op Note (Signed)
EGD PROCEDURE REPORT  PATIENT:  George Bradley  MR#:  [id number] Birthdate:  Oct 02, 1940, 72 y.o., male Endoscopist:  Dr. Malissa Hippo, MD Referred By:  Dr. Gwen Pounds, MD Procedure Date: 04/14/2013  Procedure:   EGD with ED & Colonoscopy.  Indications:  Patient is 72 year old Caucasian male who returns with solid food dysphagia. He has history of radiation-induced proximal esophageal stricture and has undergone 4 dilations last of which was in April 2014. He is also undergoing average risk screening colonoscopy.            Informed Consent:  The risks, benefits, alternatives & imponderables which include, but are not limited to, bleeding, infection, perforation, drug reaction and potential missed lesion have been reviewed.  The potential for biopsy, lesion removal, esophageal dilation, etc. have also been discussed.  Questions have been answered.  All parties agreeable.  Please see history & physical in medical record for more information.  Medications:  Demerol 50 mg IV Versed 12 mg IV Cetacaine spray topically for oropharyngeal anesthesia  EGD  Description of procedure:  The endoscope was introduced through the mouth and advanced to the second portion of the duodenum without difficulty or limitations. The mucosal surfaces were surveyed very carefully during advancement of the scope and upon withdrawal.  Findings:  Esophagus:  Noncritical stricture noted at proximal esophagus from 17-19 cm with pale mucosa. No resistance noted as the scope was passed across it. Mucosa and esophageal body revealed coarse appearance and circumferential rings suspicious for eosinophilic esophagitis. GEJ:  38 cm Hiatus:  41 cm Stomach:  Stomach was empty and distended very well with insufflation. Folds in the proximal stomach are normal. Examination mucosa and body, antrum, pyloric channel, angularis fundus and cardia was normal. Duodenum:  Normal bulbar and post bulbar mucosa.  Therapeutic/Diagnostic  Maneuvers Performed:   Esophagus was dilated by passing 48 and 52 [place] Maloney dilators. Endoscope was passed again and esophageal mucosa reexamined. Linear mucosal destruction noted at proximal esophagus along with focal destruction the GE junction. Multiple biopsies are taken from esophageal mucosa and endoscope was withdrawn.  COLONOSCOPY Description of procedure:  After a digital rectal exam was performed, that colonoscope was advanced from the anus through the rectum and colon to the area of the cecum, ileocecal valve and appendiceal orifice. The cecum was deeply intubated. These structures were well-seen and photographed for the record. From the level of the cecum and ileocecal valve, the scope was slowly and cautiously withdrawn. The mucosal surfaces were carefully surveyed utilizing scope tip to flexion to facilitate fold flattening as needed. The scope was pulled down into the rectum where a thorough exam including retroflexion was performed.  Findings:   Prep satisfactory. Small polyp ablated via cold biopsy from ascending colon. Normal rectal mucosa. Prominent hemorrhoids noted below the dentate line.  Therapeutic/Diagnostic Maneuvers Performed:  See above  Complications:  None  Cecal Withdrawal Time:  11 minutes  Impression:   EGD findings; Proximal esophageal stricture dilated with 48 and 52 [place] Maloney dilators. 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 Biopsies taken for routine histology. Small sliding hiatal hernia.  Colonoscopy findings; Small polyp ablated via cold biopsy from ascending colon. External hemorrhoids.  Recommendations:  Standard instructions given. I will be contacting patient with results of biopsy and further recommendations.  Shalisha Clausing U  04/14/2013 2:22 PM  CC: Dr. Gwen Pounds, MD & Dr. Bonnetta Barry ref. provider found

## 2013-04-14 NOTE — H&P (Signed)
George Bradley is an 72 y.o. male.   Chief Complaint: Patient's here for EGD, EGD and colonoscopy. HPI: Patient is 72 year old Caucasian male with history of esophageal stricture secondary to radiation therapy that he received head/neck carcinoma. He remains in remission. He has undergone for esophageal dilations in the past most recent of which was in April 2014. On his last exam stricture was dilated to 81 [place]. He has difficulty primarily with solids and meats. He has no difficulty with liquids. He remains with abdominal pain. He states has migrated inferiorly and has decreased in intensity. He denies melena or rectal bleeding. He is also undergoing screening colonoscopy.  Past Medical History  Diagnosis Date  . Ischemic heart disease   . Aortic stenosis, mild   . Hyperlipidemia   . Insomnia   . Fatigue   . Dizziness   . Hypotension     orthostatic  . History of echocardiogram 02/09/2009    EF -  55-60%  . History of cardiovascular stress test 03/14/2009    EF - 57%   /  mild anterior hyperkinesia with discrete anterior scar consistent with previous infarction but unchanged from previous studies.  Overall, stable findings -- Colleen Can. Deborah Chalk, MD  . Weakness   . ASCVD (arteriosclerotic cardiovascular disease)   . Arrhythmia     known history of  . Weight loss, abnormal 2012  . 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 Protein-calorie malnutrition, moderate 2012    on PEG tube feeding  . Cellulitis of artificial external opening, post-operative 2012    of PEG tube site  . Mesenteric thrombosis   . 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 Coronary artery disease     Past Surgical History  Procedure Laterality Date  . Coronary artery bypass graft  2000    x 4  . Cardiac catheterization  2007    EF - 55%  . Ablation of dysrhythmic focus    . Esophagogastroduodenoscopy  04/24/2011    Procedure: ESOPHAGOGASTRODUODENOSCOPY  (EGD);  Surgeon: Malissa Hippo, MD;  Location: [facility];  Service: Endoscopy;  Laterality: N/A;  8:30 am  . Peg placement  04/24/2011    Procedure: PERCUTANEOUS ENDOSCOPIC GASTROSTOMY (PEG) PLACEMENT;  Surgeon: Malissa Hippo, MD;  Location: [facility];  Service: Endoscopy;  Laterality: N/A;  . Esophagogastroduodenoscopy (egd) with esophageal dilation  09/02/2012    Procedure: ESOPHAGOGASTRODUODENOSCOPY (EGD) WITH ESOPHAGEAL DILATION;  Surgeon: Malissa Hippo, MD;  Location: [facility];  Service: Endoscopy;  Laterality: N/A;  245  . Esophageal dilation    . Esophagogastroduodenoscopy (egd) with esophageal dilation N/A 12/24/2012    Procedure: ESOPHAGOGASTRODUODENOSCOPY (EGD) WITH ESOPHAGEAL DILATION;  Surgeon: Malissa Hippo, MD;  Location: [facility];  Service: Endoscopy;  Laterality: N/A;  850    Family History  Problem Relation Age of Onset  . Heart disease Father   . Heart disease Brother   . Heart disease Sister   . Cancer Sister     breast cancer  . Cancer Mother     breast ca  . Hyperlipidemia Son    Social History:  reports that he quit smoking about 3 years ago. His smoking use included Cigars. He quit smokeless tobacco use about 23 months ago. He reports that he does not drink alcohol or use illicit drugs.  Allergies: No Known Allergies  Medications Prior to Admission  Medication Sig Dispense Refill  . aspirin  EC 81 MG tablet Take 81 mg by mouth at bedtime.       Marland Kitchen levothyroxine (SYNTHROID, LEVOTHROID) 50 MCG tablet Take 50 mcg by mouth daily before breakfast.      . Linaclotide (LINZESS) 145 MCG CAPS Take 1 capsule (145 mcg total) by mouth daily.  30 capsule  5  . LORazepam (ATIVAN) 1 MG tablet Take 1 mg by mouth every 8 (eight) hours as needed for anxiety.      . pantoprazole (PROTONIX) 40 MG tablet Take 40 mg by mouth daily.       . peg 3350 powder (MOVIPREP) 100 G SOLR Take 1 kit (100 g total) by mouth once.  1 kit  0  . pilocarpine (SALAGEN) 5 MG tablet  Take 5-10 mg by mouth 2 (two) times daily. Takes 5 mg at noon and 10 mg in the evening.      . rosuvastatin (CRESTOR) 20 MG tablet Take 1 tablet (20 mg total) by mouth at bedtime.  90 tablet  3  . zolpidem (AMBIEN) 10 MG tablet Take 10 mg by mouth at bedtime as needed for sleep.       Marland Kitchen gabapentin (NEURONTIN) 100 MG capsule Take 300 mg by mouth daily.         No results found for this or any previous visit (from the past 48 hour(s)). No results found.  ROS  Blood pressure 129/66, pulse 57, resp. rate 18, SpO2 97.00%. Physical Exam  Constitutional:  Well-developed thin Caucasian male in NAD.  HENT:  Mouth/Throat: Oropharynx is clear and moist.  Eyes: Conjunctivae are normal. No scleral icterus.  Neck: No thyromegaly present.  Cardiovascular: Normal rate, regular rhythm and normal heart sounds.   No murmur heard. Respiratory: Effort normal and breath sounds normal.  GI: Soft. He exhibits no distension and no mass. There is no tenderness.  Musculoskeletal: He exhibits no edema.  Lymphadenopathy:    He has no cervical adenopathy.  Neurological: He is alert.  Skin: Skin is warm and dry.     Assessment/Plan Dysphagia secondary to esophageal stricture. EGD with ED. Average risk screening colonoscopy.  Ernan Runkles U 04/14/2013, 1:13 PM

## 2013-04-19 ENCOUNTER — Other Ambulatory Visit: Payer: Self-pay | Admitting: Oncology

## 2013-04-19 ENCOUNTER — Encounter (HOSPITAL_COMMUNITY): Payer: Self-pay | Admitting: Internal Medicine

## 2013-04-25 ENCOUNTER — Encounter (INDEPENDENT_AMBULATORY_CARE_PROVIDER_SITE_OTHER): Payer: Self-pay | Admitting: *Deleted

## 2013-05-20 ENCOUNTER — Other Ambulatory Visit: Payer: Self-pay | Admitting: Oncology

## 2013-05-26 ENCOUNTER — Other Ambulatory Visit: Payer: Self-pay | Admitting: *Deleted

## 2013-05-26 NOTE — Telephone Encounter (Signed)
Chart opened to refill medication but this was not Dr. Gustavo Lah patient.Marland Kitchen

## 2013-05-30 ENCOUNTER — Other Ambulatory Visit: Payer: Self-pay | Admitting: *Deleted

## 2013-05-30 DIAGNOSIS — C109 Malignant neoplasm of oropharynx, unspecified: Secondary | ICD-10-CM

## 2013-05-30 MED ORDER — PILOCARPINE HCL 5 MG PO TABS: ORAL_TABLET | ORAL | Status: DC

## 2013-06-08 ENCOUNTER — Other Ambulatory Visit (HOSPITAL_BASED_OUTPATIENT_CLINIC_OR_DEPARTMENT_OTHER): Payer: Medicare Other | Admitting: Lab

## 2013-06-08 ENCOUNTER — Ambulatory Visit (HOSPITAL_COMMUNITY)
Admission: RE | Admit: 2013-06-08 | Discharge: 2013-06-08 | Disposition: A | Discharge status: Home / Self Care | Payer: Medicare Other | Source: Ambulatory Visit | Attending: Oncology | Admitting: Oncology

## 2013-06-08 DIAGNOSIS — M542 Cervicalgia: Secondary | ICD-10-CM | POA: Diagnosis not present

## 2013-06-08 DIAGNOSIS — Z9221 Personal history of antineoplastic chemotherapy: Secondary | ICD-10-CM | POA: Insufficient documentation

## 2013-06-08 DIAGNOSIS — I700 Atherosclerosis of aorta: Secondary | ICD-10-CM | POA: Insufficient documentation

## 2013-06-08 DIAGNOSIS — C109 Malignant neoplasm of oropharynx, unspecified: Secondary | ICD-10-CM

## 2013-06-08 DIAGNOSIS — K117 Disturbances of salivary secretion: Secondary | ICD-10-CM | POA: Insufficient documentation

## 2013-06-08 DIAGNOSIS — Z85819 Personal history of malignant neoplasm of unspecified site of lip, oral cavity, and pharynx: Secondary | ICD-10-CM | POA: Diagnosis not present

## 2013-06-08 DIAGNOSIS — E039 Hypothyroidism, unspecified: Secondary | ICD-10-CM

## 2013-06-08 DIAGNOSIS — Z923 Personal history of irradiation: Secondary | ICD-10-CM | POA: Diagnosis not present

## 2013-06-08 DIAGNOSIS — I658 Occlusion and stenosis of other precerebral arteries: Secondary | ICD-10-CM | POA: Insufficient documentation

## 2013-06-08 DIAGNOSIS — I6529 Occlusion and stenosis of unspecified carotid artery: Secondary | ICD-10-CM | POA: Diagnosis not present

## 2013-06-08 LAB — COMPREHENSIVE METABOLIC PANEL (CC13)
Albumin: 3.9 g/dL (ref 3.5–5.0)
Alkaline Phosphatase: 33 U/L — ABNORMAL LOW (ref 40–150)
BUN: 21.2 mg/dL (ref 7.0–26.0)
CO2: 26 mEq/L (ref 22–29)
Calcium: 9.5 mg/dL (ref 8.4–10.4)
Chloride: 109 mEq/L (ref 98–109)
Glucose: 97 mg/dl (ref 70–140)
Potassium: 4.7 mEq/L (ref 3.5–5.1)
Sodium: 142 mEq/L (ref 136–145)
Total Protein: 7.3 g/dL (ref 6.4–8.3)

## 2013-06-08 LAB — CBC WITH DIFFERENTIAL/PLATELET
EOS%: 1.3 % (ref 0.0–7.0)
Eosinophils Absolute: 0.1 10*3/uL (ref 0.0–0.5)
MCV: 96.5 fL (ref 79.3–98.0)
MONO%: 12.7 % (ref 0.0–14.0)
NEUT#: 2.9 10*3/uL (ref 1.5–6.5)
RBC: 4.35 10*6/uL (ref 4.20–5.82)
RDW: 14.3 % (ref 11.0–14.6)
lymph#: 1.6 10*3/uL (ref 0.9–3.3)

## 2013-06-08 LAB — TSH CHCC: TSH: 7.179 m(IU)/L — ABNORMAL HIGH (ref 0.320–4.118)

## 2013-06-08 MED ORDER — IOHEXOL 300 MG/ML  SOLN
100.0000 mL | Freq: Once | INTRAMUSCULAR | Status: AC | PRN
  Administered 2013-06-08: 100 mL via INTRAVENOUS

## 2013-06-10 ENCOUNTER — Encounter: Payer: Self-pay | Admitting: Hematology and Oncology

## 2013-06-10 ENCOUNTER — Ambulatory Visit (HOSPITAL_BASED_OUTPATIENT_CLINIC_OR_DEPARTMENT_OTHER): Payer: Medicare Other | Admitting: Hematology and Oncology

## 2013-06-10 VITALS — BP 125/61 | HR 53 | Temp 97.9°F | Resp 20 | Ht 73.0 in | Wt 182.1 lb

## 2013-06-10 DIAGNOSIS — G609 Hereditary and idiopathic neuropathy, unspecified: Secondary | ICD-10-CM | POA: Diagnosis not present

## 2013-06-10 DIAGNOSIS — E039 Hypothyroidism, unspecified: Secondary | ICD-10-CM | POA: Diagnosis not present

## 2013-06-10 DIAGNOSIS — C109 Malignant neoplasm of oropharynx, unspecified: Secondary | ICD-10-CM

## 2013-06-10 DIAGNOSIS — D696 Thrombocytopenia, unspecified: Secondary | ICD-10-CM | POA: Diagnosis not present

## 2013-06-10 DIAGNOSIS — B977 Papillomavirus as the cause of diseases classified elsewhere: Secondary | ICD-10-CM | POA: Diagnosis not present

## 2013-06-10 DIAGNOSIS — C01 Malignant neoplasm of base of tongue: Secondary | ICD-10-CM | POA: Diagnosis not present

## 2013-06-10 DIAGNOSIS — K117 Disturbances of salivary secretion: Secondary | ICD-10-CM | POA: Insufficient documentation

## 2013-06-10 MED ORDER — CEVIMELINE HCL 30 MG PO CAPS: 30.0000 mg | ORAL_CAPSULE | Freq: Three times a day (TID) | ORAL | Status: DC

## 2013-06-10 MED ORDER — LEVOTHYROXINE SODIUM 75 MCG PO TABS: 75.0000 ug | ORAL_TABLET | Freq: Every day | ORAL | Status: DC

## 2013-06-10 NOTE — Progress Notes (Signed)
[facility] OFFICE PROGRESS NOTE  Gwen Pounds, MD [address] [facility], [place]. [place] [address] No chief complaint on file.   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 The patient was diagnosed with tongue cancer around July of 2012 at the presentation with the lump. PET/CT scan showed no evidence of distant metastatic disease however he does have evidence of lymphadenopathy. He was offered concurrent chemoradiation therapy with weekly cisplatin between August to October 2012. From 2012 2 2014 he had serial imaging studies which show no evidence of disease recurrence.  INTERVAL HISTORY: George Bradley 72 y.o. male returns for further followup. He denies any new lumps or bumps swelling of lymph glands. Denies any difficulties with swallowing. No recent pain. He is to have persistent dry mouth and is using medications to help with that. His concern about difficulties paying for this medication because they were very expensive. He also noticed lack of energy recently. His appetite is stable denies any recent weight loss. Denies any new dental cavities. He still has mild altered taste sensation but does not bother him too much. 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  I have reviewed the past medical history, past surgical history, social history and family history with the patient and they are unchanged from previous note.  ALLERGIES:  has No Known Allergies.  MEDICATIONS: has a current medication list which includes the following prescription(s): aspirin ec, gabapentin, lorazepam, pantoprazole, pilocarpine, rosuvastatin, zolpidem, cevimeline, levothyroxine, and linaclotide.  REVIEW OF SYSTEMS:   Constitutional: Denies  fevers, chills or abnormal weight loss Eyes: Denies blurriness of vision Ears, nose, mouth, throat, and face: Denies mucositis or sore throat Respiratory: Denies cough, dyspnea or wheezes Cardiovascular: Denies palpitation, chest discomfort or lower extremity swelling Gastrointestinal:  Denies nausea, heartburn or change in bowel habits Skin: Denies abnormal skin rashes Lymphatics: Denies new lymphadenopathy or easy bruising Neurological:Denies numbness, tingling or new weaknesses Behavioral/Psych: Mood is stable, no new changes  All other systems were reviewed with the patient and are negative.  PHYSICAL EXAMINATION: ECOG PERFORMANCE STATUS: 0 - Asymptomatic  Filed Vitals:   06/10/13 0838  BP: 125/61  Pulse: 53  Temp: 97.9 F (36.6 C)  Resp: 20    GENERAL:alert, no distress and comfortable SKIN: skin color, texture, turgor are normal, no rashes or significant lesions EYES: normal, Conjunctiva are pink and non-injected, sclera clear OROPHARYNX:no exudate, no erythema and lips, buccal mucosa, and tongue normal  NECK: supple, thyroid normal size, non-tender, without nodularity LYMPH:  no palpable lymphadenopathy in the cervical, axillary or inguinal LUNGS: clear to auscultation and percussion with normal breathing effort HEART: regular rate & rhythm and no murmurs and no lower extremity edema ABDOMEN:abdomen soft, non-tender and normal bowel sounds Musculoskeletal:no cyanosis of digits and no clubbing  NEURO: alert & oriented x 3 with fluent speech, no focal motor/sensory deficits  LABORATORY DATA:  I have reviewed the data as listed    Component Value Date/Time   NA 142 06/08/2013 0841   NA 140 03/31/2012 0838   NA 139 10/09/2011 1425   K 4.7 06/08/2013 0841   K 4.5 03/31/2012 0838   K 3.7 10/09/2011 1425   CL 106 11/25/2012 0812   CL 100 03/31/2012 0838   CL 102  10/09/2011 1425   CO2 26 06/08/2013 0841   CO2 31 03/31/2012 0838   CO2 23 10/09/2011 1425   GLUCOSE 97 06/08/2013  0841   GLUCOSE 102* 11/25/2012 0812   GLUCOSE 99 03/31/2012 0838   GLUCOSE 106* 10/09/2011 1425   BUN 21.2 06/08/2013 0841   BUN 19 03/31/2012 0838   BUN 17 10/09/2011 1425   CREATININE 1.3 06/08/2013 0841   CREATININE 1.26 08/02/2012 1208   CREATININE 1.04 10/09/2011 1425   CALCIUM 9.5 06/08/2013 0841   CALCIUM 9.4 03/31/2012 0838   CALCIUM 10.5 10/09/2011 1425   PROT 7.3 06/08/2013 0841   PROT 7.5 03/31/2012 0838   PROT 7.6 10/09/2011 1425   ALBUMIN 3.9 06/08/2013 0841   ALBUMIN 4.2 10/09/2011 1425   AST 23 06/08/2013 0841   AST 30 03/31/2012 0838   AST 30 10/09/2011 1425   ALT 20 06/08/2013 0841   ALT 29 03/31/2012 0838   ALT 44 10/09/2011 1425   ALKPHOS 33* 06/08/2013 0841   ALKPHOS 36 03/31/2012 0838   ALKPHOS 35* 10/09/2011 1425   BILITOT 0.26 06/08/2013 0841   BILITOT 0.70 03/31/2012 0838   BILITOT 0.3 10/09/2011 1425   GFRNONAA 71* 10/09/2011 1425   GFRAA 82* 10/09/2011 1425    I No results found for this basename: SPEP, UPEP,  kappa and lambda light chains    Lab Results  Component Value Date   WBC 5.2 06/08/2013   NEUTROABS 2.9 06/08/2013   HGB 14.2 06/08/2013   HCT 42.0 06/08/2013   MCV 96.5 06/08/2013   PLT 108* 06/08/2013      Chemistry      Component Value Date/Time   NA 142 06/08/2013 0841   NA 140 03/31/2012 0838   NA 139 10/09/2011 1425   K 4.7 06/08/2013 0841   K 4.5 03/31/2012 0838   K 3.7 10/09/2011 1425   CL 106 11/25/2012 0812   CL 100 03/31/2012 0838   CL 102 10/09/2011 1425   CO2 26 06/08/2013 0841   CO2 31 03/31/2012 0838   CO2 23 10/09/2011 1425   BUN 21.2 06/08/2013 0841   BUN 19 03/31/2012 0838   BUN 17 10/09/2011 1425   CREATININE 1.3 06/08/2013 0841   CREATININE 1.26 08/02/2012 1208   CREATININE 1.04 10/09/2011 1425      Component Value Date/Time   CALCIUM 9.5 06/08/2013 0841   CALCIUM 9.4 03/31/2012 0838   CALCIUM 10.5 10/09/2011 1425   ALKPHOS 33* 06/08/2013 0841   ALKPHOS 36 03/31/2012 0838   ALKPHOS 35* 10/09/2011 1425   AST 23 06/08/2013 0841   AST 30 03/31/2012 0838    AST 30 10/09/2011 1425   ALT 20 06/08/2013 0841   ALT 29 03/31/2012 0838   ALT 44 10/09/2011 1425   BILITOT 0.26 06/08/2013 0841   BILITOT 0.70 03/31/2012 0838   BILITOT 0.3 10/09/2011 1425      RADIOGRAPHIC STUDIES: I have personally reviewed the radiological images as listed and agreed with the findings in the report. I have reviewed his most recent imaging study which show no evidence of disease recurrence I shared the imaging study with the patient.    ASSESSMENT: Squamous cell carcinoma of the tongue no evidence of disease recurrence.   PLAN:  #1 stage IV squamous cell carcinoma of the tongue Is no evidence of disease recurrence. He is currently more than 2 years out from his last treatment. We'll continue history and physical examination on her routine basis but deferr imaging study into the  future unless there is clinical suspicion of disease recurrence. I will like to see him back in 3 months with history and physical examination as well as blood work. #2 hypothyroidism His most recent TSH is still mildly elevated. I recommend a trial of increased dosed l-thyroxine from 50 mcg to 75 mcg. 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 I suggested a trial of increased dosed Neurontin but the patient declined for now #4 dry mouth I would try another brand also a different medication to see if we could reduce his co-pay. If the medication is still very expensive we will go back to his prior prescription dose of Salagen. #5 mild thrombocytopenia This has been chronic since 2012. Is not clear to me because of this. It is a possibility that he might have B12 deficiency given stomach surgery. I recommend over-the-counter B12 supplement. If it still persists in on his next visit will order some additional workup full thrombocytopenia. At present I do not think is at risk of bleeding and relatively asymptomatic and we'll  continue baby aspirin for history of heart disease.  All questions were answered. The patient knows to call the clinic with any problems, questions or concerns. We can certainly see the patient much sooner if necessary. No barriers to learning was detected.  The patient and plan discussed with [facility], Trashaun Streight  and he is in agreement with the aforementioned.  I spent 40 minutes counseling the patient face to face. The total time spent in the appointment was 60 minutes and more than 50% was on counseling.     Chiffon Kittleson, MD 06/10/2013 10:18 AM

## 2013-06-14 ENCOUNTER — Telehealth: Payer: Self-pay | Admitting: Hematology and Oncology

## 2013-06-14 NOTE — Telephone Encounter (Signed)
George Bradley and advised on 1.2.14 appt,.,.Marland KitchenMarland Kitchenpt ok and aware

## 2013-06-16 ENCOUNTER — Ambulatory Visit: Payer: Medicare Other | Admitting: Radiation Oncology

## 2013-06-28 DIAGNOSIS — E785 Hyperlipidemia, unspecified: Secondary | ICD-10-CM | POA: Diagnosis not present

## 2013-06-28 DIAGNOSIS — E039 Hypothyroidism, unspecified: Secondary | ICD-10-CM | POA: Diagnosis not present

## 2013-06-28 DIAGNOSIS — IMO0002 Reserved for concepts with insufficient information to code with codable children: Secondary | ICD-10-CM | POA: Diagnosis not present

## 2013-06-28 DIAGNOSIS — G589 Mononeuropathy, unspecified: Secondary | ICD-10-CM | POA: Diagnosis not present

## 2013-06-28 DIAGNOSIS — R109 Unspecified abdominal pain: Secondary | ICD-10-CM | POA: Diagnosis not present

## 2013-06-28 DIAGNOSIS — I2510 Atherosclerotic heart disease of native coronary artery without angina pectoris: Secondary | ICD-10-CM | POA: Diagnosis not present

## 2013-06-28 DIAGNOSIS — Z1331 Encounter for screening for depression: Secondary | ICD-10-CM | POA: Diagnosis not present

## 2013-06-28 DIAGNOSIS — K117 Disturbances of salivary secretion: Secondary | ICD-10-CM | POA: Diagnosis not present

## 2013-06-28 DIAGNOSIS — D696 Thrombocytopenia, unspecified: Secondary | ICD-10-CM | POA: Diagnosis not present

## 2013-06-28 DIAGNOSIS — C029 Malignant neoplasm of tongue, unspecified: Secondary | ICD-10-CM | POA: Diagnosis not present

## 2013-07-13 ENCOUNTER — Other Ambulatory Visit (INDEPENDENT_AMBULATORY_CARE_PROVIDER_SITE_OTHER): Payer: Self-pay | Admitting: Internal Medicine

## 2013-07-18 DIAGNOSIS — M720 Palmar fascial fibromatosis [Dupuytren]: Secondary | ICD-10-CM | POA: Diagnosis not present

## 2013-07-18 DIAGNOSIS — M159 Polyosteoarthritis, unspecified: Secondary | ICD-10-CM | POA: Diagnosis not present

## 2013-07-18 DIAGNOSIS — G620 Drug-induced polyneuropathy: Secondary | ICD-10-CM | POA: Diagnosis not present

## 2013-07-18 DIAGNOSIS — R1012 Left upper quadrant pain: Secondary | ICD-10-CM | POA: Diagnosis not present

## 2013-07-19 ENCOUNTER — Telehealth: Payer: Self-pay | Admitting: Hematology and Oncology

## 2013-07-19 NOTE — Telephone Encounter (Signed)
Faxed pt medical records to [facility] °

## 2013-08-16 DIAGNOSIS — M159 Polyosteoarthritis, unspecified: Secondary | ICD-10-CM | POA: Diagnosis not present

## 2013-08-16 DIAGNOSIS — G620 Drug-induced polyneuropathy: Secondary | ICD-10-CM | POA: Diagnosis not present

## 2013-08-16 DIAGNOSIS — R1012 Left upper quadrant pain: Secondary | ICD-10-CM | POA: Diagnosis not present

## 2013-08-26 ENCOUNTER — Other Ambulatory Visit: Payer: Self-pay

## 2013-08-26 MED ORDER — ROSUVASTATIN CALCIUM 20 MG PO TABS: 20.0000 mg | ORAL_TABLET | Freq: Every day | ORAL | Status: DC

## 2013-09-06 ENCOUNTER — Encounter (INDEPENDENT_AMBULATORY_CARE_PROVIDER_SITE_OTHER): Payer: Self-pay | Admitting: Internal Medicine

## 2013-09-06 ENCOUNTER — Ambulatory Visit (INDEPENDENT_AMBULATORY_CARE_PROVIDER_SITE_OTHER): Payer: Medicare Other | Admitting: Internal Medicine

## 2013-09-06 VITALS — BP 112/72 | HR 68 | Temp 97.1°F | Resp 16 | Ht 73.0 in | Wt 182.7 lb

## 2013-09-06 DIAGNOSIS — K219 Gastro-esophageal reflux disease without esophagitis: Secondary | ICD-10-CM

## 2013-09-06 DIAGNOSIS — K222 Esophageal obstruction: Secondary | ICD-10-CM

## 2013-09-06 DIAGNOSIS — K5900 Constipation, unspecified: Secondary | ICD-10-CM

## 2013-09-06 DIAGNOSIS — R109 Unspecified abdominal pain: Secondary | ICD-10-CM

## 2013-09-06 MED ORDER — LORAZEPAM 1 MG PO TABS: 1.0000 mg | ORAL_TABLET | Freq: Three times a day (TID) | ORAL | Status: DC

## 2013-09-06 MED ORDER — DOCUSATE SODIUM 100 MG PO CAPS: 200.0000 mg | ORAL_CAPSULE | Freq: Every day | ORAL | Status: DC

## 2013-09-06 NOTE — Progress Notes (Signed)
Presenting complaint;  Followup for dysphagia GERD and abdominal pain.  Subjective:  Patient is 72 year old Caucasian male who presents for scheduled visit. He was last seen 6 months ago. 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 Regarding his pain he has been evaluated at [facility] and his last visit he was referred to pain clinic. He was begun on gabapentin but could not tolerate a higher dose. He states he gets best relief with lorazepam which she has been for several months. His appetite is good. His swallowing is not as good as it was after last  ED in April 2014 but he feels he is doing well. He occasionally experiences heartburn with certain foods. He has gained about 2 pounds since her last visit. He complains of constipation and is taking Colace and prune juice on an as-needed basis. he also requests lorazepam refill.  Current Medications: Current Outpatient Prescriptions  Medication Sig Dispense Refill  . aspirin EC 81 MG tablet Take 81 mg by mouth at bedtime.       . cevimeline (EVOXAC) 30 MG capsule Take 1 capsule (30 mg total) by mouth 3 (three) times daily.  270 capsule  6  . gabapentin (NEURONTIN) 100 MG capsule Take 400 mg by mouth daily.       Marland Kitchen levothyroxine (SYNTHROID) 75 MCG tablet Take 1 tablet (75 mcg total) by mouth daily before breakfast.  90 tablet  6  . LORazepam (ATIVAN) 1 MG tablet TAKE ONE TABLET BY MOUTH EVERY 8 HOURS  90 tablet  2  . pantoprazole (PROTONIX) 40 MG tablet Take 40 mg by mouth daily.       . pilocarpine (SALAGEN) 5 MG tablet TAKE ONE TABLET BY MOUTH THREE TIMES DAILY  90 tablet  0  . rosuvastatin (CRESTOR) 20 MG tablet Take 1 tablet (20 mg total) by mouth at bedtime.  90 tablet  3  . vitamin B-12 (CYANOCOBALAMIN) 1000 MCG tablet Take 1,000 mcg by mouth daily.      Marland Kitchen zolpidem (AMBIEN) 10 MG tablet Take 10 mg by mouth at bedtime as needed for sleep.        No current  facility-administered medications for this visit.     Objective: Blood pressure 112/72, pulse 68, temperature 97.1 F (36.2 C), temperature source Oral, resp. rate 16, height 6\' 1"  (1.854 m), weight 182 lb 11.2 oz (82.872 kg). Patient is alert and in no acute distress. Conjunctiva is pink. Sclera is nonicteric Oropharyngeal mucosa is normal. No neck masses or thyromegaly noted. Cardiac exam with regular rhythm normal S1 and S2. Faint SEM at LLSB. Lungs are clear to auscultation. Abdomen is flat and soft. No tenderness organomegaly or masses noted. No LE edema or clubbing noted.    Assessment:  #1. History of proximal radiation-induced esophageal stricture. He has undergone four dilations in the past and most recently in April 2014. He appears to be doing well for now. #2. GERD. Symptoms well controlled with therapy. #3. 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 He has had pain since he had gastrostomy tube placed surgically the pain never left following tube removal.    Plan:  New prescription given for lorazepam 1 mg by mouth 3 times a day for 90 days with one refill. Office visit in 6 months.

## 2013-09-06 NOTE — Patient Instructions (Signed)
Call if swallowing difficulty becomes worse.

## 2013-09-23 ENCOUNTER — Telehealth: Payer: Self-pay | Admitting: Hematology and Oncology

## 2013-09-23 ENCOUNTER — Ambulatory Visit (HOSPITAL_BASED_OUTPATIENT_CLINIC_OR_DEPARTMENT_OTHER): Payer: Medicare Other | Admitting: Hematology and Oncology

## 2013-09-23 ENCOUNTER — Other Ambulatory Visit (INDEPENDENT_AMBULATORY_CARE_PROVIDER_SITE_OTHER): Payer: Self-pay | Admitting: Internal Medicine

## 2013-09-23 ENCOUNTER — Encounter: Payer: Self-pay | Admitting: Hematology and Oncology

## 2013-09-23 ENCOUNTER — Other Ambulatory Visit (HOSPITAL_BASED_OUTPATIENT_CLINIC_OR_DEPARTMENT_OTHER): Payer: Medicare Other

## 2013-09-23 ENCOUNTER — Other Ambulatory Visit: Payer: Self-pay | Admitting: Hematology and Oncology

## 2013-09-23 VITALS — BP 119/66 | HR 64 | Temp 97.8°F | Resp 18 | Ht 73.0 in | Wt 186.3 lb

## 2013-09-23 DIAGNOSIS — G609 Hereditary and idiopathic neuropathy, unspecified: Secondary | ICD-10-CM | POA: Diagnosis not present

## 2013-09-23 DIAGNOSIS — C109 Malignant neoplasm of oropharynx, unspecified: Secondary | ICD-10-CM

## 2013-09-23 DIAGNOSIS — E039 Hypothyroidism, unspecified: Secondary | ICD-10-CM

## 2013-09-23 DIAGNOSIS — K117 Disturbances of salivary secretion: Secondary | ICD-10-CM | POA: Diagnosis not present

## 2013-09-23 DIAGNOSIS — D696 Thrombocytopenia, unspecified: Secondary | ICD-10-CM | POA: Diagnosis not present

## 2013-09-23 DIAGNOSIS — C01 Malignant neoplasm of base of tongue: Secondary | ICD-10-CM

## 2013-09-23 LAB — COMPREHENSIVE METABOLIC PANEL (CC13)
ALK PHOS: 36 U/L — AB (ref 40–150)
ALT: 27 U/L (ref 0–55)
AST: 28 U/L (ref 5–34)
Albumin: 4.1 g/dL (ref 3.5–5.0)
Anion Gap: 11 mEq/L (ref 3–11)
BILIRUBIN TOTAL: 0.31 mg/dL (ref 0.20–1.20)
BUN: 21 mg/dL (ref 7.0–26.0)
CO2: 26 mEq/L (ref 22–29)
CREATININE: 1.3 mg/dL (ref 0.7–1.3)
Calcium: 9.8 mg/dL (ref 8.4–10.4)
Chloride: 107 mEq/L (ref 98–109)
GLUCOSE: 88 mg/dL (ref 70–140)
POTASSIUM: 4.8 meq/L (ref 3.5–5.1)
Sodium: 144 mEq/L (ref 136–145)
Total Protein: 7.3 g/dL (ref 6.4–8.3)

## 2013-09-23 LAB — CBC WITH DIFFERENTIAL/PLATELET
BASO%: 0.5 % (ref 0.0–2.0)
BASOS ABS: 0 10*3/uL (ref 0.0–0.1)
EOS ABS: 0.1 10*3/uL (ref 0.0–0.5)
EOS%: 1.1 % (ref 0.0–7.0)
HCT: 42.7 % (ref 38.4–49.9)
HEMOGLOBIN: 14.2 g/dL (ref 13.0–17.1)
LYMPH%: 27.2 % (ref 14.0–49.0)
MCH: 33.1 pg (ref 27.2–33.4)
MCHC: 33.2 g/dL (ref 32.0–36.0)
MCV: 99.4 fL — AB (ref 79.3–98.0)
MONO#: 0.5 10*3/uL (ref 0.1–0.9)
MONO%: 10.7 % (ref 0.0–14.0)
NEUT#: 3 10*3/uL (ref 1.5–6.5)
NEUT%: 60.5 % (ref 39.0–75.0)
PLATELETS: 116 10*3/uL — AB (ref 140–400)
RBC: 4.3 10*6/uL (ref 4.20–5.82)
RDW: 14.3 % (ref 11.0–14.6)
WBC: 5 10*3/uL (ref 4.0–10.3)
lymph#: 1.4 10*3/uL (ref 0.9–3.3)

## 2013-09-23 LAB — T4, FREE: FREE T4: 1.19 ng/dL (ref 0.80–1.80)

## 2013-09-23 LAB — TSH CHCC: TSH: 4.639 m[IU]/L — AB (ref 0.320–4.118)

## 2013-09-23 MED ORDER — LEVOTHYROXINE SODIUM 100 MCG PO TABS: 100.0000 ug | ORAL_TABLET | Freq: Every day | ORAL | Status: DC

## 2013-09-23 NOTE — Telephone Encounter (Signed)
gv and printed appt sched and avs for pt for July 2015 °

## 2013-09-23 NOTE — Progress Notes (Signed)
[facility] OFFICE PROGRESS NOTE  Patient Care Team: Precious Reel, MD as PCP - General (Internal Medicine)  DIAGNOSIS: HPV negative right base of tongue, squamous cell carcinoma with history of cigar smoking, TX N2M0  SUMMARY OF ONCOLOGIC HISTORY: The patient was diagnosed with tongue cancer around July of 2012 at the presentation with the lump. PET/CT scan showed no evidence of distant metastatic disease however he does have evidence of lymphadenopathy. He was offered concurrent chemoradiation therapy with weekly cisplatin between August to October 2012. From 2012 to 2014 he had serial imaging studies which show no evidence of disease recurrence.  INTERVAL HISTORY: George Bradley 73 y.o. male returns for further followup. He still had persistent altered taste sensation. Denies any new lumps or bumps. He still has her a poor energy level. He had some mild dysphagia requiring esophageal dilatation. He still has grade 1 peripheral neuropathy of which he uses Neurontin on a regular basis. He denies any recent fever, chills, night sweats or abnormal weight loss The patient denies any recent signs or symptoms of bleeding such as spontaneous epistaxis, hematuria or hematochezia.  I have reviewed the past medical history, past surgical history, social history and family history with the patient and they are unchanged from previous note.  ALLERGIES:  has No Known Allergies.  MEDICATIONS:  Current Outpatient Prescriptions  Medication Sig Dispense Refill  . aspirin EC 81 MG tablet Take 81 mg by mouth at bedtime.       . cevimeline (EVOXAC) 30 MG capsule Take 1 capsule (30 mg total) by mouth 3 (three) times daily.  270 capsule  6  . docusate sodium (COLACE) 100 MG capsule Take 2 capsules (200 mg total) by mouth daily.  10 capsule  0  . gabapentin (NEURONTIN) 100 MG capsule Take 400 mg by mouth daily.       Marland Kitchen levothyroxine (SYNTHROID) 75 MCG tablet Take 1 tablet (75 mcg total) by  mouth daily before breakfast.  90 tablet  6  . LORazepam (ATIVAN) 1 MG tablet Take 1 tablet (1 mg total) by mouth 3 (three) times daily.  270 tablet  1  . pantoprazole (PROTONIX) 40 MG tablet Take 40 mg by mouth daily.       . rosuvastatin (CRESTOR) 20 MG tablet Take 1 tablet (20 mg total) by mouth at bedtime.  90 tablet  3  . vitamin B-12 (CYANOCOBALAMIN) 1000 MCG tablet Take 1,000 mcg by mouth daily.      Marland Kitchen zolpidem (AMBIEN) 10 MG tablet Take 10 mg by mouth at bedtime as needed for sleep.        No current facility-administered medications for this visit.    REVIEW OF SYSTEMS:   Eyes: Denies blurriness of vision Respiratory: Denies cough, dyspnea or wheezes Cardiovascular: Denies palpitation, chest discomfort or lower extremity swelling Gastrointestinal:  Denies nausea, heartburn or change in bowel habits Skin: Denies abnormal skin rashes Lymphatics: Denies new lymphadenopathy or easy bruising Neurological:Denies numbness, tingling or new weaknesses Behavioral/Psych: Mood is stable, no new changes  All other systems were reviewed with the patient and are negative.  PHYSICAL EXAMINATION: ECOG PERFORMANCE STATUS: 1 - Symptomatic but completely ambulatory  Filed Vitals:   09/23/13 1406  BP: 119/66  Pulse: 64  Temp: 97.8 F (36.6 C)  Resp: 18   Filed Weights   09/23/13 1406  Weight: 186 lb 4.8 oz (84.505 kg)    GENERAL:alert, no distress and comfortable SKIN: skin color, texture, turgor are normal, no  rashes or significant lesions EYES: normal, Conjunctiva are pink and non-injected, sclera clear OROPHARYNX:no exudate, no erythema and lips, buccal mucosa, and tongue normal  NECK: supple, thyroid normal size, non-tender, without nodularity LYMPH:  no palpable lymphadenopathy in the cervical, axillary or inguinal LUNGS: clear to auscultation and percussion with normal breathing effort HEART: regular rate & rhythm and no murmurs and no lower extremity edema ABDOMEN:abdomen  soft, non-tender and normal bowel sounds Musculoskeletal:no cyanosis of digits and no clubbing  NEURO: alert & oriented x 3 with fluent speech, no focal motor/sensory deficits  LABORATORY DATA:  I have reviewed the data as listed    Component Value Date/Time   NA 142 06/08/2013 0841   NA 140 03/31/2012 0838   NA 139 10/09/2011 1425   K 4.7 06/08/2013 0841   K 4.5 03/31/2012 0838   K 3.7 10/09/2011 1425   CL 106 11/25/2012 0812   CL 100 03/31/2012 0838   CL 102 10/09/2011 1425   CO2 26 06/08/2013 0841   CO2 31 03/31/2012 0838   CO2 23 10/09/2011 1425   GLUCOSE 97 06/08/2013 0841   GLUCOSE 102* 11/25/2012 0812   GLUCOSE 99 03/31/2012 0838   GLUCOSE 106* 10/09/2011 1425   BUN 21.2 06/08/2013 0841   BUN 19 03/31/2012 0838   BUN 17 10/09/2011 1425   CREATININE 1.3 06/08/2013 0841   CREATININE 1.26 08/02/2012 1208   CREATININE 1.04 10/09/2011 1425   CALCIUM 9.5 06/08/2013 0841   CALCIUM 9.4 03/31/2012 0838   CALCIUM 10.5 10/09/2011 1425   PROT 7.3 06/08/2013 0841   PROT 7.5 03/31/2012 0838   PROT 7.6 10/09/2011 1425   ALBUMIN 3.9 06/08/2013 0841   ALBUMIN 4.2 10/09/2011 1425   AST 23 06/08/2013 0841   AST 30 03/31/2012 0838   AST 30 10/09/2011 1425   ALT 20 06/08/2013 0841   ALT 29 03/31/2012 0838   ALT 44 10/09/2011 1425   ALKPHOS 33* 06/08/2013 0841   ALKPHOS 36 03/31/2012 0838   ALKPHOS 35* 10/09/2011 1425   BILITOT 0.26 06/08/2013 0841   BILITOT 0.70 03/31/2012 0838   BILITOT 0.3 10/09/2011 1425   GFRNONAA 71* 10/09/2011 1425   GFRAA 82* 10/09/2011 1425    No results found for this basename: SPEP,  UPEP,   kappa and lambda light chains    Lab Results  Component Value Date   WBC 5.0 09/23/2013   NEUTROABS 3.0 09/23/2013   HGB 14.2 09/23/2013   HCT 42.7 09/23/2013   MCV 99.4* 09/23/2013   PLT 116* 09/23/2013      Chemistry      Component Value Date/Time   NA 142 06/08/2013 0841   NA 140 03/31/2012 0838   NA 139 10/09/2011 1425   K 4.7 06/08/2013 0841   K 4.5 03/31/2012 0838   K 3.7 10/09/2011 1425   CL 106  11/25/2012 0812   CL 100 03/31/2012 0838   CL 102 10/09/2011 1425   CO2 26 06/08/2013 0841   CO2 31 03/31/2012 0838   CO2 23 10/09/2011 1425   BUN 21.2 06/08/2013 0841   BUN 19 03/31/2012 0838   BUN 17 10/09/2011 1425   CREATININE 1.3 06/08/2013 0841   CREATININE 1.26 08/02/2012 1208   CREATININE 1.04 10/09/2011 1425      Component Value Date/Time   CALCIUM 9.5 06/08/2013 0841   CALCIUM 9.4 03/31/2012 0838   CALCIUM 10.5 10/09/2011 1425   ALKPHOS 33* 06/08/2013 0841   ALKPHOS 36 03/31/2012 0838   ALKPHOS 35*  10/09/2011 1425   AST 23 06/08/2013 0841   AST 30 03/31/2012 0838   AST 30 10/09/2011 1425   ALT 20 06/08/2013 0841   ALT 29 03/31/2012 0838   ALT 44 10/09/2011 1425   BILITOT 0.26 06/08/2013 0841   BILITOT 0.70 03/31/2012 0838   BILITOT 0.3 10/09/2011 1425      ASSESSMENT & PLAN:  #1 stage IV squamous cell carcinoma of the tongue There is no evidence of disease recurrence. He is currently more than 2 years out from his last treatment. We'll continue history and physical examination on her routine basis but defer imaging study into the future unless there is clinical suspicion of disease recurrence. I will like to see him back in 6 months with history and physical examination as well as blood work. #2 hypothyroidism His most recent TSH is was elevated. Previously, I had increased the dosage of his levothyroxin from 50 mcg to 75 mcg. Recheck his thyroid function tests from today show that the TSH is improved but is still a bit low. The patient had significant fatigue. 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 I suggested a trial of increased dosed Neurontin but the patient declined for now #4 dry mouth Continue conservative management #5 mild thrombocytopenia This has been chronic since 2012. Is not clear to me because of this. It is a possibility that he might have  B12 deficiency given stomach surgery. I recommend over-the-counter B12 supplement. This is much improved now  Orders Placed This Encounter  Procedures  . CBC with Differential    Standing Status: Future     Number of Occurrences:      Standing Expiration Date: 06/15/2014  . Comprehensive metabolic panel    Standing Status: Future     Number of Occurrences:      Standing Expiration Date: 09/23/2014  . T4, free    Standing Status: Future     Number of Occurrences:      Standing Expiration Date: 09/23/2014  . TSH    Standing Status: Future     Number of Occurrences:      Standing Expiration Date: 09/23/2014   All questions were answered. The patient knows to call the clinic with any problems, questions or concerns. No barriers to learning was detected. I spent 25 minutes counseling the patient face to face. The total time spent in the appointment was 40 minutes and more than 50% was on counseling and review of test results     [facility], [place], MD 09/23/2013 2:40 PM

## 2013-10-17 ENCOUNTER — Telehealth: Payer: Self-pay | Admitting: *Deleted

## 2013-10-17 NOTE — Telephone Encounter (Signed)
Faxed request form for cevimeline to [facility]

## 2013-11-07 ENCOUNTER — Other Ambulatory Visit (INDEPENDENT_AMBULATORY_CARE_PROVIDER_SITE_OTHER): Payer: Self-pay | Admitting: Internal Medicine

## 2014-01-02 DIAGNOSIS — E785 Hyperlipidemia, unspecified: Secondary | ICD-10-CM | POA: Diagnosis not present

## 2014-01-02 DIAGNOSIS — I2510 Atherosclerotic heart disease of native coronary artery without angina pectoris: Secondary | ICD-10-CM | POA: Diagnosis not present

## 2014-01-02 DIAGNOSIS — R82998 Other abnormal findings in urine: Secondary | ICD-10-CM | POA: Diagnosis not present

## 2014-01-02 DIAGNOSIS — E039 Hypothyroidism, unspecified: Secondary | ICD-10-CM | POA: Diagnosis not present

## 2014-01-02 DIAGNOSIS — Z125 Encounter for screening for malignant neoplasm of prostate: Secondary | ICD-10-CM | POA: Diagnosis not present

## 2014-01-09 DIAGNOSIS — Z125 Encounter for screening for malignant neoplasm of prostate: Secondary | ICD-10-CM | POA: Diagnosis not present

## 2014-01-09 DIAGNOSIS — Z0000 Encounter for general adult medical examination without abnormal findings: Secondary | ICD-10-CM | POA: Diagnosis not present

## 2014-01-09 DIAGNOSIS — R1319 Other dysphagia: Secondary | ICD-10-CM | POA: Diagnosis not present

## 2014-01-09 DIAGNOSIS — N138 Other obstructive and reflux uropathy: Secondary | ICD-10-CM | POA: Diagnosis not present

## 2014-01-09 DIAGNOSIS — K117 Disturbances of salivary secretion: Secondary | ICD-10-CM | POA: Diagnosis not present

## 2014-01-09 DIAGNOSIS — D696 Thrombocytopenia, unspecified: Secondary | ICD-10-CM | POA: Diagnosis not present

## 2014-01-09 DIAGNOSIS — E039 Hypothyroidism, unspecified: Secondary | ICD-10-CM | POA: Diagnosis not present

## 2014-01-09 DIAGNOSIS — C029 Malignant neoplasm of tongue, unspecified: Secondary | ICD-10-CM | POA: Diagnosis not present

## 2014-01-09 DIAGNOSIS — G589 Mononeuropathy, unspecified: Secondary | ICD-10-CM | POA: Diagnosis not present

## 2014-01-09 DIAGNOSIS — N401 Enlarged prostate with lower urinary tract symptoms: Secondary | ICD-10-CM | POA: Diagnosis not present

## 2014-01-18 ENCOUNTER — Telehealth (INDEPENDENT_AMBULATORY_CARE_PROVIDER_SITE_OTHER): Payer: Self-pay | Admitting: *Deleted

## 2014-01-18 ENCOUNTER — Other Ambulatory Visit (INDEPENDENT_AMBULATORY_CARE_PROVIDER_SITE_OTHER): Payer: Self-pay | Admitting: *Deleted

## 2014-01-18 ENCOUNTER — Encounter (HOSPITAL_COMMUNITY): Payer: Self-pay | Admitting: Pharmacy Technician

## 2014-01-18 DIAGNOSIS — R1310 Dysphagia, unspecified: Secondary | ICD-10-CM

## 2014-01-18 DIAGNOSIS — K222 Esophageal obstruction: Secondary | ICD-10-CM

## 2014-01-18 NOTE — Telephone Encounter (Signed)
agree

## 2014-01-18 NOTE — Telephone Encounter (Signed)
  Procedure: egd/ed  Reason/Indication:  Esophageal stricture, dysphagia  Has patient had this procedure before?  yes  If so, when, by whom and where?    Is there a family history of colon cancer?    Who?  What age when diagnosed?    Is patient diabetic?   no      Does patient have prosthetic heart valve?  no  Do you have a pacemaker?  no  Has patient ever had endocarditis? no  Has patient had joint replacement within last 12 months?  no  Does patient tend to be constipated or take laxatives? no  Is patient on Coumadin, Plavix and/or Aspirin? yes  Medications: see EPIC  Allergies: see EPIC  Medication Adjustment: asa 2 days  Procedure date & time: 01/23/14 at 730

## 2014-01-18 NOTE — Telephone Encounter (Signed)
EGD/ED sch'd 01/23/14, patient aware

## 2014-01-18 NOTE — Telephone Encounter (Signed)
Patient's wife called, left message, he i having difficulty swallowing, wants to have egd/ed, can I scheduled this, please advise

## 2014-01-18 NOTE — Telephone Encounter (Signed)
Please schedule patient for EGD an ED.

## 2014-01-23 ENCOUNTER — Ambulatory Visit (HOSPITAL_COMMUNITY)
Admission: RE | Admit: 2014-01-23 | Discharge: 2014-01-23 | Disposition: A | Discharge status: Home / Self Care | Payer: Medicare Other | Source: Ambulatory Visit | Attending: Internal Medicine | Admitting: Internal Medicine

## 2014-01-23 ENCOUNTER — Encounter (HOSPITAL_COMMUNITY): Payer: Self-pay | Admitting: *Deleted

## 2014-01-23 ENCOUNTER — Encounter (HOSPITAL_COMMUNITY)
Admission: RE | Disposition: A | Discharge status: Home / Self Care | Payer: Self-pay | Source: Ambulatory Visit | Attending: Internal Medicine

## 2014-01-23 DIAGNOSIS — E785 Hyperlipidemia, unspecified: Secondary | ICD-10-CM | POA: Diagnosis not present

## 2014-01-23 DIAGNOSIS — Z85810 Personal history of malignant neoplasm of tongue: Secondary | ICD-10-CM | POA: Diagnosis not present

## 2014-01-23 DIAGNOSIS — Z7982 Long term (current) use of aspirin: Secondary | ICD-10-CM | POA: Diagnosis not present

## 2014-01-23 DIAGNOSIS — R1310 Dysphagia, unspecified: Secondary | ICD-10-CM | POA: Diagnosis not present

## 2014-01-23 DIAGNOSIS — K449 Diaphragmatic hernia without obstruction or gangrene: Secondary | ICD-10-CM | POA: Diagnosis not present

## 2014-01-23 DIAGNOSIS — K222 Esophageal obstruction: Secondary | ICD-10-CM | POA: Insufficient documentation

## 2014-01-23 DIAGNOSIS — Z87891 Personal history of nicotine dependence: Secondary | ICD-10-CM | POA: Diagnosis not present

## 2014-01-23 DIAGNOSIS — Y842 Radiological procedure and radiotherapy as the cause of abnormal reaction of the patient, or of later complication, without mention of misadventure at the time of the procedure: Secondary | ICD-10-CM | POA: Insufficient documentation

## 2014-01-23 HISTORY — PX: ESOPHAGOGASTRODUODENOSCOPY: SHX5428

## 2014-01-23 HISTORY — PX: MALONEY DILATION: SHX5535

## 2014-01-23 HISTORY — PX: BALLOON DILATION: SHX5330

## 2014-01-23 HISTORY — PX: SAVORY DILATION: SHX5439

## 2014-01-23 SURGERY — EGD (ESOPHAGOGASTRODUODENOSCOPY)
Anesthesia: Moderate Sedation

## 2014-01-23 MED ORDER — BUTAMBEN-TETRACAINE-BENZOCAINE 2-2-14 % EX AERO
INHALATION_SPRAY | CUTANEOUS | Status: DC | PRN
  Administered 2014-01-23: 2 via TOPICAL

## 2014-01-23 MED ORDER — MIDAZOLAM HCL 5 MG/5ML IJ SOLN
INTRAMUSCULAR | Status: DC | PRN
  Administered 2014-01-23: 2 mg via INTRAVENOUS
  Administered 2014-01-23: 3 mg via INTRAVENOUS
  Administered 2014-01-23: 2 mg via INTRAVENOUS
  Administered 2014-01-23: 1 mg via INTRAVENOUS

## 2014-01-23 MED ORDER — MIDAZOLAM HCL 5 MG/5ML IJ SOLN
INTRAMUSCULAR | Status: AC
  Filled 2014-01-23: qty 10

## 2014-01-23 MED ORDER — SODIUM CHLORIDE 0.9 % IV SOLN
INTRAVENOUS | Status: DC
  Administered 2014-01-23: 1000 mL via INTRAVENOUS

## 2014-01-23 MED ORDER — STERILE WATER FOR IRRIGATION IR SOLN
Status: DC | PRN
  Administered 2014-01-23: 08:00:00

## 2014-01-23 MED ORDER — MEPERIDINE HCL 50 MG/ML IJ SOLN
INTRAMUSCULAR | Status: DC | PRN
  Administered 2014-01-23 (×2): 25 mg via INTRAVENOUS

## 2014-01-23 MED ORDER — MEPERIDINE HCL 50 MG/ML IJ SOLN
INTRAMUSCULAR | Status: AC
  Filled 2014-01-23: qty 1

## 2014-01-23 NOTE — Discharge Instructions (Signed)
Resume usual medications and diet. No driving for 24 hours. Call office with progress report in one week.  Gastrointestinal Endoscopy Care After Refer to this sheet in the next few weeks. These instructions provide you with information on caring for yourself after your procedure. Your caregiver may also give you more specific instructions. Your treatment has been planned according to current medical practices, but problems sometimes occur. Call your caregiver if you have any problems or questions after your procedure. HOME CARE INSTRUCTIONS  If you were given medicine to help you relax (sedative), do not drive, operate machinery, or sign important documents for 24 hours.  Avoid alcohol and hot or warm beverages for the first 24 hours after the procedure.  Only take over-the-counter or prescription medicines for pain, discomfort, or fever as directed by your caregiver. You may resume taking your normal medicines unless your caregiver tells you otherwise. Ask your caregiver when you may resume taking medicines that may cause bleeding, such as aspirin, clopidogrel, or warfarin.  You may return to your normal diet and activities on the day after your procedure, or as directed by your caregiver. Walking may help to reduce any bloated feeling in your abdomen.  Drink enough fluids to keep your urine clear or pale yellow.  You may gargle with salt water if you have a sore throat. SEEK IMMEDIATE MEDICAL CARE IF:  You have severe nausea or vomiting.  You have severe abdominal pain, abdominal cramps that last longer than 6 hours, or abdominal swelling (distention).  You have severe shoulder or back pain.  You have trouble swallowing.  You have shortness of breath, your breathing is shallow, or you are breathing faster than normal.  You have a fever or a rapid heartbeat.  You vomit blood or material that looks like coffee grounds.  You have bloody, black, or tarry stools. MAKE SURE  YOU:  Understand these instructions.  Will watch your condition.  Will get help right away if you are not doing well or get worse.

## 2014-01-23 NOTE — Op Note (Signed)
EGD PROCEDURE REPORT  PATIENT:  George Bradley  MR#:  [id number] Birthdate:  01/17/1941, 73 y.o., male Endoscopist:  Dr. Rogene Houston, MD Referred By:  Dr. Latricia Heft. Harrington Challenger, MD Procedure Date: 01/23/2014  Procedure:   EGD with ED.  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 He has undergone multiple dilations radiation therapy his last dilation was in July 2014.            Informed Consent:  The risks, benefits, alternatives & imponderables which include, but are not limited to, bleeding, infection, perforation, drug reaction and potential missed lesion have been reviewed.  The potential for biopsy, lesion removal, esophageal dilation, etc. have also been discussed.  Questions have been answered.  All parties agreeable.  Please see history & physical in medical record for more information.  Medications:  Demerol 50 mg IV Versed 8 mg IV Cetacaine spray topically for oropharyngeal anesthesia  Description of procedure:  The endoscope was introduced through the mouth and advanced to the second portion of the duodenum without difficulty or limitations. The mucosal surfaces were surveyed very carefully during advancement of the scope and upon withdrawal.  Findings:  Esophagus:  Short stricture noted low UES with pale mucosa. No ulcer or mass noted. Mucosa at the superficial body reveal coarse appearance no erosions or ulcers noted. GEJ:  42 cm Hiatus:  44 cm Stomach:  Stomach was empty and distended very well with insufflation. Folds in the proximal stomach are normal. Examination of mucosa at body, antrum, pyloric channel, angularis, fundus and cardia was normal. Duodenum:  Normal bulbar and post bulbar mucosa.  Therapeutic/Diagnostic Maneuvers Performed:   Esophagus was dilated by passing 48 and 52 [place] Maloney dilators to full insertion. Esophageal mucosa was  examined post dilation and very small short linear mucosal destruction noted. Esophagus was further dilated by passing 54 [place] Maloney dilator and an esophageal mucosa was reexamined. Single small linear mucosal disruption noted at proximal esophagus.  Complications:  None   Impression: Proximal radiation-induced stricture esophageal stricture dilated from 48-54 [place] with [facility] dilators. Small sliding-type hernia. Coarse appearance to the superficial mucosa. Esophageal biopsy in the past has been negative for eosinophilic esophagitis.  Recommendations:  Standard instructions given. Patient will call if dysphagia recurs.  Rogene Houston  01/23/2014  8:04 AM  CC: Dr. Precious Reel, MD & Dr. Rayne Du ref. provider found

## 2014-01-23 NOTE — H&P (Signed)
George Bradley is an 73 y.o. male.   Chief Complaint: Patient's here for EGD and ED. 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 He remains in remission. Has required periodic dilations. His last dilation was in July of 2014. He began to have difficulty swallowing solids about 6 weeks ago. Heartburn is well controlled with therapy. He is maintaining his weight. He remains with chronic epigastric pain which seemed to get relief with lorazepam.he has been thoroughly evaluated for this pain and no etiology identified   Past Medical History  Diagnosis Date  . Ischemic heart disease   . Aortic stenosis, mild   . Hyperlipidemia   . Insomnia   . Fatigue   . Dizziness   . Hypotension     orthostatic  . History of echocardiogram 02/09/2009    EF -  55-60%  . History of cardiovascular stress test 03/14/2009    EF - 57%   /  mild anterior hyperkinesia with discrete anterior scar consistent with previous infarction but unchanged from previous studies.  Overall, stable findings -- George Bradley. George Lew, MD  . Weakness   . ASCVD (arteriosclerotic cardiovascular disease)   . Arrhythmia     known history of  . Weight loss, abnormal 2012  . 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 Protein-calorie malnutrition, moderate 2012    on PEG tube feeding  . Cellulitis of artificial external opening, post-operative 2012    of PEG tube site  . Mesenteric thrombosis   . 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 Coronary artery disease   . Thrombocytopenia, unspecified 06/10/2013    Past Surgical History  Procedure Laterality Date  . Coronary artery bypass graft  2000    x 4  . Cardiac catheterization  2007    EF - 55%  . Ablation of dysrhythmic focus    . Esophagogastroduodenoscopy  04/24/2011    Procedure: ESOPHAGOGASTRODUODENOSCOPY (EGD);  Surgeon: George Houston, MD;  Location: [facility];  Service: Endoscopy;  Laterality: N/A;  8:30 am  . Peg placement  04/24/2011    Procedure: PERCUTANEOUS ENDOSCOPIC GASTROSTOMY (PEG) PLACEMENT;  Surgeon: George Houston, MD;  Location: [facility];  Service: Endoscopy;  Laterality: N/A;  . Esophagogastroduodenoscopy (egd) with esophageal dilation  09/02/2012    Procedure: ESOPHAGOGASTRODUODENOSCOPY (EGD) WITH ESOPHAGEAL DILATION;  Surgeon: George Houston, MD;  Location: [facility];  Service: Endoscopy;  Laterality: N/A;  245  . Esophageal dilation    . Esophagogastroduodenoscopy (egd) with esophageal dilation N/A 12/24/2012    Procedure: ESOPHAGOGASTRODUODENOSCOPY (EGD) WITH ESOPHAGEAL DILATION;  Surgeon: George Houston, MD;  Location: [facility];  Service: Endoscopy;  Laterality: N/A;  850  . Colonoscopy with esophagogastroduodenoscopy (egd) N/A 04/14/2013    Procedure: COLONOSCOPY WITH ESOPHAGOGASTRODUODENOSCOPY (EGD);  Surgeon: George Houston, MD;  Location: [facility];  Service: Endoscopy;  Laterality: N/A;  145  . Balloon dilation N/A 04/14/2013    Procedure: BALLOON DILATION;  Surgeon: George Houston, MD;  Location: [facility];  Service: Endoscopy;  Laterality: N/A;  George Bradley dilation N/A 04/14/2013    Procedure: George Bradley DILATION;  Surgeon: George Houston, MD;  Location: [facility];  Service: Endoscopy;  Laterality: N/A;  . Savory dilation N/A 04/14/2013    Procedure: SAVORY DILATION;  Surgeon: George Houston, MD;  Location: [facility];  Service: Endoscopy;  Laterality: N/A;    Family History  Problem Relation Age of Onset  . Heart disease Father   . Heart disease Brother   . Heart disease Sister   . Cancer Sister     breast cancer  . Cancer Mother     breast ca  . Hyperlipidemia Son    Social History:  reports that he quit smoking about 4 years ago. His smoking use included Cigars. He quit smokeless tobacco use about 2 years ago. He reports that he does not drink alcohol or  use illicit drugs.  Allergies: No Known Allergies  Medications Prior to Admission  Medication Sig Dispense Refill  . aspirin EC 81 MG tablet Take 81 mg by mouth at bedtime.       . cevimeline (EVOXAC) 30 MG capsule Take 1 capsule (30 mg total) by mouth 3 (three) times daily.  270 capsule  6  . gabapentin (NEURONTIN) 100 MG capsule Take 100-200 mg by mouth 3 (three) times daily. Takes 1 capsule twice daily and 2 capsules at bedtime      . levothyroxine (SYNTHROID) 100 MCG tablet Take 1 tablet (100 mcg total) by mouth daily before breakfast.  90 tablet  1  . LORazepam (ATIVAN) 1 MG tablet Take 1 tablet (1 mg total) by mouth 3 (three) times daily.  270 tablet  1  . pantoprazole (PROTONIX) 40 MG tablet Take 40 mg by mouth daily.       . rosuvastatin (CRESTOR) 20 MG tablet Take 1 tablet (20 mg total) by mouth at bedtime.  90 tablet  3  . vitamin B-12 (CYANOCOBALAMIN) 1000 MCG tablet Take 1,000 mcg by mouth daily.      Marland Kitchen zolpidem (AMBIEN) 10 MG tablet Take 10 mg by mouth at bedtime as needed for sleep.         No results found for this or any previous visit (from the past 48 hour(s)). No results found.  ROS  Blood pressure 121/65, temperature 97.6 F (36.4 C), temperature source Oral, resp. rate 14, height 6\' 1"  (1.854 m), weight 183 lb (83.008 kg), SpO2 95.00%. Physical Exam  Constitutional: He appears well-developed and well-nourished.  HENT:  Mouth/Throat: Oropharynx is clear and moist.  Eyes: Conjunctivae are normal. No scleral icterus.  Neck: No thyromegaly present.  Cardiovascular: Normal rate, regular rhythm and normal heart sounds.   No murmur heard. Respiratory: Effort normal and breath sounds normal.  GI: Soft. He exhibits no distension and no mass. There is no tenderness.  Musculoskeletal: He exhibits no edema.  Lymphadenopathy:    He has no cervical adenopathy.  Neurological: He is alert.  Skin: Skin is warm and dry.     Assessment/Plan Solid food dysphagia. History  of radiation-induced esophageal stricture last dilation was in July 2014.  EGD the ED.  George Bradley 01/23/2014, 7:33 AM

## 2014-01-26 ENCOUNTER — Encounter (HOSPITAL_COMMUNITY): Payer: Self-pay | Admitting: Internal Medicine

## 2014-02-16 ENCOUNTER — Ambulatory Visit: Payer: Medicare Other | Admitting: Cardiology

## 2014-02-27 ENCOUNTER — Encounter (INDEPENDENT_AMBULATORY_CARE_PROVIDER_SITE_OTHER): Payer: Self-pay

## 2014-02-27 ENCOUNTER — Ambulatory Visit (INDEPENDENT_AMBULATORY_CARE_PROVIDER_SITE_OTHER): Payer: Medicare Other | Admitting: Cardiology

## 2014-02-27 ENCOUNTER — Encounter: Payer: Self-pay | Admitting: Cardiology

## 2014-02-27 VITALS — BP 102/64 | HR 56 | Ht 72.0 in | Wt 183.0 lb

## 2014-02-27 DIAGNOSIS — I951 Orthostatic hypotension: Secondary | ICD-10-CM | POA: Diagnosis not present

## 2014-02-27 DIAGNOSIS — E7800 Pure hypercholesterolemia, unspecified: Secondary | ICD-10-CM

## 2014-02-27 DIAGNOSIS — I259 Chronic ischemic heart disease, unspecified: Secondary | ICD-10-CM

## 2014-02-27 NOTE — Patient Instructions (Signed)
Continue your current therapy  I will see you in one year   

## 2014-02-27 NOTE — Progress Notes (Signed)
Subjective:   [name] comes in today for followup visit. He had coronary artery bypass grafting in 2000. His last catheterization was in 2007 and showed patent saphenous vein grafts x3 with a patent left internal mammary artery and well-preserved LV function. His EF is 55%. His last nuclear stress test in July of 2012 showed a small fixed apical defect with normal ejection fraction. He has a history of orthostatic hypotension and some lightheadedness on occasion. He is on no antihypertensive therapy. He's had a history of what sounds like a Maze procedure when he lived in [place]. His other problems include hyperlipidemia, insomnia, and a history of depression. On followup today he is doing very well. He denies any chest pain or SOB. He reports that he is cancer free. He remains very active but states he doesn't have the energy to run like he used to.  Current Outpatient Prescriptions  Medication Sig Dispense Refill  . aspirin EC 81 MG tablet Take 81 mg by mouth at bedtime.       . cevimeline (EVOXAC) 30 MG capsule Take 1 capsule (30 mg total) by mouth 3 (three) times daily.  270 capsule  6  . docusate sodium (COLACE) 100 MG capsule Take 100 mg by mouth daily.      Marland Kitchen gabapentin (NEURONTIN) 100 MG capsule Take 100-200 mg by mouth 3 (three) times daily. Takes 1 capsule twice daily and 2 capsules at bedtime      . levothyroxine (SYNTHROID) 100 MCG tablet Take 1 tablet (100 mcg total) by mouth daily before breakfast.  90 tablet  1  . LORazepam (ATIVAN) 1 MG tablet Take 1 tablet (1 mg total) by mouth 3 (three) times daily.  270 tablet  1  . MELATONIN PO Take by mouth at bedtime as needed.      . pantoprazole (PROTONIX) 40 MG tablet Take 40 mg by mouth daily.       . Probiotic Product (PROBIOTIC DAILY PO) Take by mouth.      . rosuvastatin (CRESTOR) 20 MG tablet Take 1 tablet (20 mg total) by mouth at bedtime.  90 tablet  3  . vitamin B-12 (CYANOCOBALAMIN) 1000 MCG tablet Take 1,000 mcg by mouth  daily.      Marland Kitchen zolpidem (AMBIEN) 10 MG tablet Take 10 mg by mouth at bedtime as needed for sleep.        No current facility-administered medications for this visit.    No Known Allergies  Patient Active Problem List   Diagnosis Date Noted  . Xerostomia 06/10/2013  . Thrombocytopenia, unspecified 06/10/2013  . S/P radiation therapy   . 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 Orthostatic hypotension 05/11/2012  . Epigastric pain 05/11/2012  . Cancer of base of tongue 11/17/2011  . Depression 10/01/2011  . 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 Abdominal pain, acute, periumbilical 32/08/2481  . 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 Protein-calorie malnutrition, moderate   . Weight loss, abnormal   . Ischemic heart disease 02/20/2011  . Hypercholesterolemia 02/20/2011  . Aortic valve stenosis, mild 02/20/2011    History  Smoking status  . Former Smoker  . Types: Cigars  . Quit date: 09/21/2009  Smokeless tobacco  . Former Systems developer  . Quit date: 05/06/2011    Comment: 2 cigars a week    History  Alcohol Use No    Comment: occasionally glass of wine    Family History  Problem Relation Age of Onset  . Heart disease Father   .  Heart disease Brother   . Heart disease Sister   . Cancer Sister     breast cancer  . Cancer Mother     breast ca  . Hyperlipidemia Son     Review of Systems:   As noted in history of present illness.  All other systems were reviewed and are negative.   Physical Exam:    BP 102/64  Pulse 56  Ht 6' (1.829 m)  Wt 183 lb (83.008 kg)  BMI 24.81 kg/m2   The head is normocephalic and atraumatic.  Pupils are equally round and reactive to light.  Sclerae nonicteric.  Conjunctiva is clear.  Oropharynx is clear.   Neck is supple there are no masses.  Thyroid is not enlarged.  There is no lymphadenopathy.  Lungs are clear.  Chest is symmetric.  Heart shows a regular rate and rhythm.  S1 and S2 are normal.  There is a soft systolic outflow  murmur.  Abdomen is soft normal bowel sounds. There is no organomegaly.   Extremities are without edema.  Peripheral pulses are adequate.  Neurologically intact.  Skin is warm and dry.  Laboratory data:    Assessment / Plan:  1. Orthostatic hypotension. This has been a chronic issue. Patient is managing well with conservative measures. On no BP lowering drugs.  2.  Coronary disease status post CABG. Clinically asymptomatic. Negative myoview in 7/12. Continue risk factor modification. On ASA and statin. Follow up in one year.  3. 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 Currently cancer free.

## 2014-03-07 ENCOUNTER — Ambulatory Visit (INDEPENDENT_AMBULATORY_CARE_PROVIDER_SITE_OTHER): Payer: Medicare Other | Admitting: Internal Medicine

## 2014-03-12 ENCOUNTER — Other Ambulatory Visit: Payer: Self-pay | Admitting: Hematology and Oncology

## 2014-03-14 ENCOUNTER — Encounter (INDEPENDENT_AMBULATORY_CARE_PROVIDER_SITE_OTHER): Payer: Self-pay | Admitting: Internal Medicine

## 2014-03-14 ENCOUNTER — Ambulatory Visit (INDEPENDENT_AMBULATORY_CARE_PROVIDER_SITE_OTHER): Payer: Medicare Other | Admitting: Internal Medicine

## 2014-03-14 VITALS — BP 110/68 | HR 70 | Temp 97.0°F | Resp 18 | Ht 73.0 in | Wt 178.6 lb

## 2014-03-14 DIAGNOSIS — I259 Chronic ischemic heart disease, unspecified: Secondary | ICD-10-CM

## 2014-03-14 DIAGNOSIS — G8929 Other chronic pain: Secondary | ICD-10-CM | POA: Diagnosis not present

## 2014-03-14 DIAGNOSIS — K222 Esophageal obstruction: Secondary | ICD-10-CM

## 2014-03-14 DIAGNOSIS — R1013 Epigastric pain: Secondary | ICD-10-CM

## 2014-03-14 DIAGNOSIS — K219 Gastro-esophageal reflux disease without esophagitis: Secondary | ICD-10-CM | POA: Diagnosis not present

## 2014-03-14 MED ORDER — LORAZEPAM 1 MG PO TABS: 1.0000 mg | ORAL_TABLET | Freq: Three times a day (TID) | ORAL | Status: DC

## 2014-03-14 NOTE — Patient Instructions (Signed)
Notify if abdominal pain recurs or you have swallowing difficulty. Next lorazepam refill in 6 months.

## 2014-03-14 NOTE — Progress Notes (Signed)
Presenting complaint;  Followup for chronic abdominal pain GERD and esophageal stricture.   Subjective:  Patient is 73 year old Caucasian male who is here for scheduled visit. He reports continued benefit by taking lorazepam before each meal. He has more pain if he forgets to take his medication. He describes his swallowing to be fair. Last dilation for radiation-induced proximal esophageal stricture was on 01/23/2014. He has no difficulty with soft foods. He has difficulty mainly with meat. Heartburn is well controlled with medication. However if he misses pantoprazole he starts having heartburn same day. He has good appetite but he has lost 4 pounds since his last visit 6 months ago. He stays dry mouth has improved significantly with cevimeline. He states that maybe because he is not able to enjoy his wife's cooking because she sustained fracture to her upper extremity.    Current Medications: Outpatient Encounter Prescriptions as of 03/14/2014  Medication Sig  . aspirin EC 81 MG tablet Take 81 mg by mouth at bedtime.   . cevimeline (EVOXAC) 30 MG capsule Take 1 capsule (30 mg total) by mouth 3 (three) times daily.  Marland Kitchen docusate sodium (COLACE) 100 MG capsule Take 100 mg by mouth daily.  Marland Kitchen gabapentin (NEURONTIN) 100 MG capsule Take 100-200 mg by mouth 3 (three) times daily. Takes 1 capsule twice daily and 2 capsules at bedtime  . levothyroxine (SYNTHROID, LEVOTHROID) 100 MCG tablet TAKE ONE TABLET BY MOUTH ONCE DAILY BEFORE  BREAKFAST  . LORazepam (ATIVAN) 1 MG tablet Take 1 tablet (1 mg total) by mouth 3 (three) times daily.  Marland Kitchen MELATONIN PO Take by mouth at bedtime as needed.  . pantoprazole (PROTONIX) 40 MG tablet Take 40 mg by mouth daily.   . Probiotic Product (PROBIOTIC DAILY PO) Take by mouth.  . rosuvastatin (CRESTOR) 20 MG tablet Take 1 tablet (20 mg total) by mouth at bedtime.  . vitamin B-12 (CYANOCOBALAMIN) 1000 MCG tablet Take 1,000 mcg by mouth daily.  Marland Kitchen zolpidem (AMBIEN) 10 MG  tablet Take 10 mg by mouth at bedtime as needed for sleep.      Objective: Blood pressure 110/68, pulse 70, temperature 97 F (36.1 C), temperature source Oral, resp. rate 18, height 6\' 1"  (1.854 m), weight 178 lb 9.6 oz (81.012 kg). Patient is alert and in no acute distress. Conjunctiva is pink. Sclera is nonicteric Oropharyngeal mucosa is normal. No neck masses or thyromegaly noted. Cardiac exam with regular rhythm normal S1 and S2. No murmur or gallop noted. Lungs are clear to auscultation. Abdomen is symmetrical. Bowel sounds are normal. No bruits noted. He has mild midepigastric tenderness. No organomegaly or masses.  No LE edema or clubbing noted.   Assessment:  #1. Radiation-induced proximal esophageal stricture. He is requiring periodic esophageal dilations. Stricture was dilated to 54 [place] and 01/23/2014. He has history of head neck carcinoma and remains in remission. #2. Chronic GERD. He is doing well with therapy. #3. 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 Extensive workup in the past and negative including evaluation at [facility] He has history of SMV thrombosis but his pain started long before this diagnosis was made. Since he gets relief for lorazepam pain is felt to be either neuropathic or dysmotility. He is not abusing the medication. #4. He is average risk for CRC. He is up-to-date on screening. Last exam was in July 2014.   Plan:  New prescription for lorazepam 1 mg before each meal given for 90 days with one refill. Next description will be due in 6  months. Patient will call if dysphagia gets worse.

## 2014-03-15 ENCOUNTER — Other Ambulatory Visit: Payer: Self-pay | Admitting: Hematology and Oncology

## 2014-03-23 ENCOUNTER — Encounter: Payer: Self-pay | Admitting: Hematology and Oncology

## 2014-03-23 ENCOUNTER — Other Ambulatory Visit: Payer: Medicare Other

## 2014-03-23 ENCOUNTER — Ambulatory Visit (HOSPITAL_BASED_OUTPATIENT_CLINIC_OR_DEPARTMENT_OTHER): Payer: Medicare Other | Admitting: Hematology and Oncology

## 2014-03-23 VITALS — BP 114/71 | HR 66 | Temp 97.5°F | Resp 18 | Ht 73.0 in | Wt 179.2 lb

## 2014-03-23 DIAGNOSIS — G620 Drug-induced polyneuropathy: Secondary | ICD-10-CM | POA: Insufficient documentation

## 2014-03-23 DIAGNOSIS — K1370 Unspecified lesions of oral mucosa: Secondary | ICD-10-CM

## 2014-03-23 DIAGNOSIS — K5900 Constipation, unspecified: Secondary | ICD-10-CM | POA: Insufficient documentation

## 2014-03-23 DIAGNOSIS — K117 Disturbances of salivary secretion: Secondary | ICD-10-CM | POA: Diagnosis not present

## 2014-03-23 DIAGNOSIS — C109 Malignant neoplasm of oropharynx, unspecified: Secondary | ICD-10-CM

## 2014-03-23 DIAGNOSIS — T451X5A Adverse effect of antineoplastic and immunosuppressive drugs, initial encounter: Secondary | ICD-10-CM

## 2014-03-23 DIAGNOSIS — R1310 Dysphagia, unspecified: Secondary | ICD-10-CM | POA: Diagnosis not present

## 2014-03-23 DIAGNOSIS — C01 Malignant neoplasm of base of tongue: Secondary | ICD-10-CM

## 2014-03-23 DIAGNOSIS — E039 Hypothyroidism, unspecified: Secondary | ICD-10-CM | POA: Diagnosis not present

## 2014-03-23 DIAGNOSIS — D696 Thrombocytopenia, unspecified: Secondary | ICD-10-CM

## 2014-03-23 LAB — COMPREHENSIVE METABOLIC PANEL (CC13)
ALBUMIN: 4.1 g/dL (ref 3.5–5.0)
ALT: 25 U/L (ref 0–55)
AST: 24 U/L (ref 5–34)
Alkaline Phosphatase: 35 U/L — ABNORMAL LOW (ref 40–150)
Anion Gap: 9 mEq/L (ref 3–11)
BUN: 17.7 mg/dL (ref 7.0–26.0)
CO2: 23 mEq/L (ref 22–29)
Calcium: 9.8 mg/dL (ref 8.4–10.4)
Chloride: 108 mEq/L (ref 98–109)
Creatinine: 1.3 mg/dL (ref 0.7–1.3)
GLUCOSE: 101 mg/dL (ref 70–140)
POTASSIUM: 4.4 meq/L (ref 3.5–5.1)
Sodium: 140 mEq/L (ref 136–145)
Total Bilirubin: 0.39 mg/dL (ref 0.20–1.20)
Total Protein: 7.4 g/dL (ref 6.4–8.3)

## 2014-03-23 LAB — CBC WITH DIFFERENTIAL/PLATELET
BASO%: 0.4 % (ref 0.0–2.0)
Basophils Absolute: 0 10*3/uL (ref 0.0–0.1)
EOS%: 1.2 % (ref 0.0–7.0)
Eosinophils Absolute: 0.1 10*3/uL (ref 0.0–0.5)
HCT: 41.6 % (ref 38.4–49.9)
HGB: 14.1 g/dL (ref 13.0–17.1)
LYMPH%: 29.8 % (ref 14.0–49.0)
MCH: 32.2 pg (ref 27.2–33.4)
MCHC: 33.9 g/dL (ref 32.0–36.0)
MCV: 95 fL (ref 79.3–98.0)
MONO#: 0.6 10*3/uL (ref 0.1–0.9)
MONO%: 11.8 % (ref 0.0–14.0)
NEUT#: 2.8 10*3/uL (ref 1.5–6.5)
NEUT%: 56.8 % (ref 39.0–75.0)
Platelets: 120 10*3/uL — ABNORMAL LOW (ref 140–400)
RBC: 4.38 10*6/uL (ref 4.20–5.82)
RDW: 13.5 % (ref 11.0–14.6)
WBC: 4.9 10*3/uL (ref 4.0–10.3)
lymph#: 1.5 10*3/uL (ref 0.9–3.3)

## 2014-03-23 LAB — TSH CHCC: TSH: 2.397 m(IU)/L (ref 0.320–4.118)

## 2014-03-23 LAB — T4, FREE: Free T4: 1.34 ng/dL (ref 0.80–1.80)

## 2014-03-23 NOTE — Assessment & Plan Note (Addendum)
Repeat thyroid function test is pending. I will continue to adjust his medication as needed. 

## 2014-03-23 NOTE — Assessment & Plan Note (Signed)
He continues to take Evoxac for that. This contributed to constipation but the patient wants to continue.

## 2014-03-23 NOTE — Assessment & Plan Note (Signed)
This is likely related to his medications. I recommend taking laxatives regularly.

## 2014-03-23 NOTE — Assessment & Plan Note (Signed)
This is only mild, grade 1. He will continue using Neurontin as needed.

## 2014-03-23 NOTE — Progress Notes (Signed)
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 PET/CT scan showed no evidence of distant metastatic disease however he does have evidence of lymphadenopathy. He was offered concurrent chemoradiation therapy with weekly cisplatin between August to October 2012. From 2012 to 2014 he had serial imaging studies which show no evidence of disease recurrence.  INTERVAL HISTORY: Please see below for problem oriented charting. He has multiple complaints today. Complain of poor energy, persistent dysphagia, dry mouth, peripheral neuropathy and constipation.  REVIEW OF SYSTEMS:   Constitutional: Denies fevers, chills or abnormal weight loss Eyes: Denies blurriness of vision Ears, nose, mouth, throat, and face: Denies mucositis or sore throat Respiratory: Denies cough, dyspnea or wheezes Cardiovascular: Denies palpitation, chest discomfort or lower extremity swelling Skin: Denies abnormal skin rashes Lymphatics: Denies new lymphadenopathy or easy bruising Neurological:Denies numbness, tingling or new weaknesses Behavioral/Psych: Mood is stable, no new changes  All other systems were reviewed with the patient and are negative.  I have reviewed the past medical history, past surgical history, social history and family history with the patient and they are unchanged from previous note.  ALLERGIES:  has No Known Allergies.  MEDICATIONS:  Current Outpatient Prescriptions  Medication Sig Dispense Refill  . aspirin EC 81 MG tablet Take 81 mg by mouth at bedtime.       . cevimeline (EVOXAC) 30 MG capsule Take 1 capsule (30 mg total) by mouth 3 (three) times daily.  270 capsule  6  . docusate  sodium (COLACE) 100 MG capsule Take 100 mg by mouth 2 (two) times daily.       Marland Kitchen gabapentin (NEURONTIN) 100 MG capsule Take 100-200 mg by mouth 3 (three) times daily. Takes 1 capsule twice daily and 2 capsules at bedtime      . levothyroxine (SYNTHROID, LEVOTHROID) 100 MCG tablet TAKE ONE TABLET BY MOUTH ONCE DAILY BEFORE  BREAKFAST  90 tablet  0  . LORazepam (ATIVAN) 1 MG tablet Take 1 tablet (1 mg total) by mouth 3 (three) times daily.  270 tablet  1  . MELATONIN PO Take by mouth at bedtime as needed.      . pantoprazole (PROTONIX) 40 MG tablet Take 40 mg by mouth daily.       . Probiotic Product (PROBIOTIC DAILY PO) Take by mouth.      . rosuvastatin (CRESTOR) 20 MG tablet Take 1 tablet (20 mg total) by mouth at bedtime.  90 tablet  3  . vitamin B-12 (CYANOCOBALAMIN) 1000 MCG tablet Take 1,000 mcg by mouth daily.      Marland Kitchen zolpidem (AMBIEN) 10 MG tablet Take 10 mg by mouth at bedtime as needed for sleep.        No current facility-administered medications for this visit.    PHYSICAL EXAMINATION: ECOG PERFORMANCE STATUS: 1 - Symptomatic but completely ambulatory  Filed Vitals:   03/23/14 0830  BP: 114/71  Pulse: 66  Temp: 97.5 F (36.4 C)  Resp: 18   Filed Weights   03/23/14 0830  Weight: 179 lb 3.2 oz (81.285 kg)    GENERAL:alert, no distress and comfortable SKIN: skin color, texture, turgor are normal, no rashes or significant lesions EYES: normal, Conjunctiva are pink and non-injected,  sclera clear OROPHARYNX:no exudate, no erythema and lips, buccal mucosa, and tongue normal  NECK: Neck is thick from fibrosis, thyroid normal size, non-tender, without nodularity LYMPH:  no palpable lymphadenopathy in the cervical, axillary or inguinal LUNGS: clear to auscultation and percussion with normal breathing effort HEART: regular rate & rhythm and no murmurs and no lower extremity edema ABDOMEN:abdomen soft, non-tender and normal bowel sounds Musculoskeletal:no cyanosis of digits and  no clubbing  NEURO: alert & oriented x 3 with fluent speech, no focal motor/sensory deficits  LABORATORY DATA:  I have reviewed the data as listed    Component Value Date/Time   NA 140 03/23/2014 0821   NA 140 03/31/2012 0838   NA 139 10/09/2011 1425   K 4.4 03/23/2014 0821   K 4.5 03/31/2012 0838   K 3.7 10/09/2011 1425   CL 106 11/25/2012 0812   CL 100 03/31/2012 0838   CL 102 10/09/2011 1425   CO2 23 03/23/2014 0821   CO2 31 03/31/2012 0838   CO2 23 10/09/2011 1425   GLUCOSE 101 03/23/2014 0821   GLUCOSE 102* 11/25/2012 0812   GLUCOSE 99 03/31/2012 0838   GLUCOSE 106* 10/09/2011 1425   BUN 17.7 03/23/2014 0821   BUN 19 03/31/2012 0838   BUN 17 10/09/2011 1425   CREATININE 1.3 03/23/2014 0821   CREATININE 1.26 08/02/2012 1208   CREATININE 1.04 10/09/2011 1425   CALCIUM 9.8 03/23/2014 0821   CALCIUM 9.4 03/31/2012 0838   CALCIUM 10.5 10/09/2011 1425   PROT 7.4 03/23/2014 0821   PROT 7.5 03/31/2012 0838   PROT 7.6 10/09/2011 1425   ALBUMIN 4.1 03/23/2014 0821   ALBUMIN 4.2 10/09/2011 1425   AST 24 03/23/2014 0821   AST 30 03/31/2012 0838   AST 30 10/09/2011 1425   ALT 25 03/23/2014 0821   ALT 29 03/31/2012 0838   ALT 44 10/09/2011 1425   ALKPHOS 35* 03/23/2014 0821   ALKPHOS 36 03/31/2012 0838   ALKPHOS 35* 10/09/2011 1425   BILITOT 0.39 03/23/2014 0821   BILITOT 0.70 03/31/2012 0838   BILITOT 0.3 10/09/2011 1425   GFRNONAA 71* 10/09/2011 1425   GFRAA 82* 10/09/2011 1425    No results found for this basename: SPEP,  UPEP,   kappa and lambda light chains    Lab Results  Component Value Date   WBC 4.9 03/23/2014   NEUTROABS 2.8 03/23/2014   HGB 14.1 03/23/2014   HCT 41.6 03/23/2014   MCV 95.0 03/23/2014   PLT 120* 03/23/2014      Chemistry      Component Value Date/Time   NA 140 03/23/2014 0821   NA 140 03/31/2012 0838   NA 139 10/09/2011 1425   K 4.4 03/23/2014 0821   K 4.5 03/31/2012 0838   K 3.7 10/09/2011 1425   CL 106 11/25/2012 0812   CL 100 03/31/2012 0838   CL 102 10/09/2011 1425   CO2 23 03/23/2014 0821   CO2 31  03/31/2012 0838   CO2 23 10/09/2011 1425   BUN 17.7 03/23/2014 0821   BUN 19 03/31/2012 0838   BUN 17 10/09/2011 1425   CREATININE 1.3 03/23/2014 0821   CREATININE 1.26 08/02/2012 1208   CREATININE 1.04 10/09/2011 1425      Component Value Date/Time   CALCIUM 9.8 03/23/2014 0821   CALCIUM 9.4 03/31/2012 0838   CALCIUM 10.5 10/09/2011 1425   ALKPHOS 35* 03/23/2014 0821   ALKPHOS 36 03/31/2012 0838   ALKPHOS 35* 10/09/2011 1425   AST 24 03/23/2014  0821   AST 30 03/31/2012 0838   AST 30 10/09/2011 1425   ALT 25 03/23/2014 0821   ALT 29 03/31/2012 0838   ALT 44 10/09/2011 1425   BILITOT 0.39 03/23/2014 0821   BILITOT 0.70 03/31/2012 0838   BILITOT 0.3 10/09/2011 1425      ASSESSMENT & PLAN:  Cancer of base of tongue Clinically, he has no signs of recurrence. The patient wants to return here yearly for checkup. I will continue to see him every your in the spring with history, physical examination and blood work.  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 This contributed to constipation but the patient wants to continue.  Thrombocytopenia, unspecified This is likely related to prior treatment. He is not symptomatic. Continue observation.  Hypothyroid Repeat thyroid function test is pending. I will continue to adjust his medication as needed.  Constipation This is likely related to his medications. I recommend taking laxatives regularly.  Dysphagia This is related to side effects of prior radiation. He has esophageal dilatation recently that seems to help. He denies recent choking or aspiration.  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 He will continue using Neurontin as needed.    Orders Placed This Encounter  Procedures  . CBC with Differential    Standing Status: Future     Number of Occurrences:      Standing Expiration Date: 03/23/2015  . Comprehensive metabolic panel    Standing Status: Future     Number of Occurrences:      Standing  Expiration Date: 03/23/2015  . TSH    Standing Status: Future     Number of Occurrences:      Standing Expiration Date: 03/23/2015  . T4, free    Standing Status: Future     Number of Occurrences:      Standing Expiration Date: 03/23/2015   All questions were answered. The patient knows to call the clinic with any problems, questions or concerns. No barriers to learning was detected. I spent 25 minutes counseling the patient face to face. The total time spent in the appointment was 30 minutes and more than 50% was on counseling and review of test results     [facility], [place], MD 03/23/2014 10:02 AM

## 2014-03-23 NOTE — Assessment & Plan Note (Signed)
Clinically, he has no signs of recurrence. The patient wants to return here yearly for checkup. I will continue to see him every your in the spring with history, physical examination and blood work.

## 2014-03-23 NOTE — Assessment & Plan Note (Signed)
This is related to side effects of prior radiation. He has esophageal dilatation recently that seems to help. He denies recent choking or aspiration. 

## 2014-03-23 NOTE — Assessment & Plan Note (Signed)
This is likely related to prior treatment. He is not symptomatic. Continue observation. 

## 2014-03-24 ENCOUNTER — Telehealth: Payer: Self-pay | Admitting: Hematology and Oncology

## 2014-03-24 NOTE — Telephone Encounter (Signed)
Talked to  pt , gave him appt for lab and Md for April 2016

## 2014-04-12 DIAGNOSIS — H40019 Open angle with borderline findings, low risk, unspecified eye: Secondary | ICD-10-CM | POA: Diagnosis not present

## 2014-04-12 DIAGNOSIS — H524 Presbyopia: Secondary | ICD-10-CM | POA: Diagnosis not present

## 2014-04-12 DIAGNOSIS — H02409 Unspecified ptosis of unspecified eyelid: Secondary | ICD-10-CM | POA: Diagnosis not present

## 2014-04-12 DIAGNOSIS — H2510 Age-related nuclear cataract, unspecified eye: Secondary | ICD-10-CM | POA: Diagnosis not present

## 2014-06-26 ENCOUNTER — Other Ambulatory Visit: Payer: Self-pay | Admitting: Hematology and Oncology

## 2014-06-30 ENCOUNTER — Other Ambulatory Visit: Payer: Self-pay | Admitting: Hematology and Oncology

## 2014-07-03 ENCOUNTER — Other Ambulatory Visit: Payer: Self-pay | Admitting: *Deleted

## 2014-07-03 MED ORDER — CEVIMELINE HCL 30 MG PO CAPS: ORAL_CAPSULE | ORAL | Status: DC

## 2014-07-11 DIAGNOSIS — Z1389 Encounter for screening for other disorder: Secondary | ICD-10-CM | POA: Diagnosis not present

## 2014-07-11 DIAGNOSIS — Z79899 Other long term (current) drug therapy: Secondary | ICD-10-CM | POA: Diagnosis not present

## 2014-07-11 DIAGNOSIS — R109 Unspecified abdominal pain: Secondary | ICD-10-CM | POA: Diagnosis not present

## 2014-07-11 DIAGNOSIS — I2510 Atherosclerotic heart disease of native coronary artery without angina pectoris: Secondary | ICD-10-CM | POA: Diagnosis not present

## 2014-07-11 DIAGNOSIS — E785 Hyperlipidemia, unspecified: Secondary | ICD-10-CM | POA: Diagnosis not present

## 2014-07-11 DIAGNOSIS — C029 Malignant neoplasm of tongue, unspecified: Secondary | ICD-10-CM | POA: Diagnosis not present

## 2014-07-11 DIAGNOSIS — E039 Hypothyroidism, unspecified: Secondary | ICD-10-CM | POA: Diagnosis not present

## 2014-07-11 DIAGNOSIS — G629 Polyneuropathy, unspecified: Secondary | ICD-10-CM | POA: Diagnosis not present

## 2014-07-11 DIAGNOSIS — Z23 Encounter for immunization: Secondary | ICD-10-CM | POA: Diagnosis not present

## 2014-07-26 DIAGNOSIS — H40013 Open angle with borderline findings, low risk, bilateral: Secondary | ICD-10-CM | POA: Diagnosis not present

## 2014-09-05 ENCOUNTER — Other Ambulatory Visit (INDEPENDENT_AMBULATORY_CARE_PROVIDER_SITE_OTHER): Payer: Self-pay | Admitting: Internal Medicine

## 2014-09-28 ENCOUNTER — Other Ambulatory Visit: Payer: Self-pay

## 2014-09-28 MED ORDER — ROSUVASTATIN CALCIUM 20 MG PO TABS: 20.0000 mg | ORAL_TABLET | Freq: Every day | ORAL | Status: DC

## 2014-10-04 ENCOUNTER — Telehealth (INDEPENDENT_AMBULATORY_CARE_PROVIDER_SITE_OTHER): Payer: Self-pay | Admitting: *Deleted

## 2014-10-04 NOTE — Telephone Encounter (Signed)
George Bradley said food is getting stuck in his throat and thinks it is time to have it stretched again. His return phone number is [phone].

## 2014-10-05 ENCOUNTER — Telehealth (INDEPENDENT_AMBULATORY_CARE_PROVIDER_SITE_OTHER): Payer: Self-pay | Admitting: *Deleted

## 2014-10-05 ENCOUNTER — Other Ambulatory Visit (INDEPENDENT_AMBULATORY_CARE_PROVIDER_SITE_OTHER): Payer: Self-pay | Admitting: *Deleted

## 2014-10-05 DIAGNOSIS — K222 Esophageal obstruction: Secondary | ICD-10-CM

## 2014-10-05 DIAGNOSIS — R1310 Dysphagia, unspecified: Secondary | ICD-10-CM

## 2014-10-05 NOTE — Telephone Encounter (Addendum)
Referring MD/PCP: russo   Procedure: egd/ed  Reason/Indication:  Dysphagia, esophageal stricture  Has patient had this procedure before?  Yes, 01/2014  If so, when, by whom and where?    Is there a family history of colon cancer?    Who?  What age when diagnosed?    Is patient diabetic?   no      Does patient have prosthetic heart valve?  no  Do you have a pacemaker?  no  Has patient ever had endocarditis? no  Has patient had joint replacement within last 12 months?  no  Does patient tend to be constipated or take laxatives? no  Is patient on Coumadin, Plavix and/or Aspirin? yes  Medications: see epic  Allergies: see epci  Medication Adjustment: asa 2 days  Procedure date & time: 10/25/14 at 200 (100)

## 2014-10-05 NOTE — Telephone Encounter (Signed)
Egd/ed sch'd 10/13/14, left detailed message for patient

## 2014-10-06 NOTE — Telephone Encounter (Signed)
agree

## 2014-10-13 ENCOUNTER — Emergency Department (HOSPITAL_COMMUNITY)
Admission: EM | Admit: 2014-10-13 | Discharge: 2014-10-13 | Disposition: A | Discharge status: Home / Self Care | Payer: Medicare Other | Attending: Emergency Medicine | Admitting: Emergency Medicine

## 2014-10-13 ENCOUNTER — Encounter (HOSPITAL_COMMUNITY): Payer: Self-pay | Admitting: Emergency Medicine

## 2014-10-13 ENCOUNTER — Emergency Department (HOSPITAL_COMMUNITY): Payer: Medicare Other

## 2014-10-13 DIAGNOSIS — R011 Cardiac murmur, unspecified: Secondary | ICD-10-CM | POA: Insufficient documentation

## 2014-10-13 DIAGNOSIS — E785 Hyperlipidemia, unspecified: Secondary | ICD-10-CM | POA: Insufficient documentation

## 2014-10-13 DIAGNOSIS — K219 Gastro-esophageal reflux disease without esophagitis: Secondary | ICD-10-CM | POA: Insufficient documentation

## 2014-10-13 DIAGNOSIS — Z862 Personal history of diseases of the blood and blood-forming organs and certain disorders involving the immune mechanism: Secondary | ICD-10-CM | POA: Diagnosis not present

## 2014-10-13 DIAGNOSIS — E039 Hypothyroidism, unspecified: Secondary | ICD-10-CM | POA: Insufficient documentation

## 2014-10-13 DIAGNOSIS — Z79899 Other long term (current) drug therapy: Secondary | ICD-10-CM | POA: Insufficient documentation

## 2014-10-13 DIAGNOSIS — N289 Disorder of kidney and ureter, unspecified: Secondary | ICD-10-CM | POA: Insufficient documentation

## 2014-10-13 DIAGNOSIS — Z8589 Personal history of malignant neoplasm of other organs and systems: Secondary | ICD-10-CM | POA: Insufficient documentation

## 2014-10-13 DIAGNOSIS — Z7982 Long term (current) use of aspirin: Secondary | ICD-10-CM | POA: Insufficient documentation

## 2014-10-13 DIAGNOSIS — Z951 Presence of aortocoronary bypass graft: Secondary | ICD-10-CM | POA: Diagnosis not present

## 2014-10-13 DIAGNOSIS — Z87891 Personal history of nicotine dependence: Secondary | ICD-10-CM | POA: Insufficient documentation

## 2014-10-13 DIAGNOSIS — Z8669 Personal history of other diseases of the nervous system and sense organs: Secondary | ICD-10-CM | POA: Insufficient documentation

## 2014-10-13 DIAGNOSIS — R0789 Other chest pain: Secondary | ICD-10-CM | POA: Diagnosis not present

## 2014-10-13 DIAGNOSIS — I2510 Atherosclerotic heart disease of native coronary artery without angina pectoris: Secondary | ICD-10-CM | POA: Insufficient documentation

## 2014-10-13 DIAGNOSIS — R079 Chest pain, unspecified: Secondary | ICD-10-CM | POA: Diagnosis not present

## 2014-10-13 LAB — CBC WITH DIFFERENTIAL/PLATELET
Basophils Absolute: 0 10*3/uL (ref 0.0–0.1)
Basophils Relative: 0 % (ref 0–1)
EOS ABS: 0.1 10*3/uL (ref 0.0–0.7)
EOS PCT: 2 % (ref 0–5)
HCT: 39.4 % (ref 39.0–52.0)
HEMOGLOBIN: 13.4 g/dL (ref 13.0–17.0)
LYMPHS ABS: 1.4 10*3/uL (ref 0.7–4.0)
LYMPHS PCT: 27 % (ref 12–46)
MCH: 32.5 pg (ref 26.0–34.0)
MCHC: 34 g/dL (ref 30.0–36.0)
MCV: 95.6 fL (ref 78.0–100.0)
MONO ABS: 0.7 10*3/uL (ref 0.1–1.0)
MONOS PCT: 13 % — AB (ref 3–12)
Neutro Abs: 3 10*3/uL (ref 1.7–7.7)
Neutrophils Relative %: 58 % (ref 43–77)
PLATELETS: 133 10*3/uL — AB (ref 150–400)
RBC: 4.12 MIL/uL — AB (ref 4.22–5.81)
RDW: 13.8 % (ref 11.5–15.5)
WBC: 5.1 10*3/uL (ref 4.0–10.5)

## 2014-10-13 LAB — COMPREHENSIVE METABOLIC PANEL
ALT: 32 U/L (ref 0–53)
AST: 34 U/L (ref 0–37)
Albumin: 4.3 g/dL (ref 3.5–5.2)
Alkaline Phosphatase: 30 U/L — ABNORMAL LOW (ref 39–117)
Anion gap: 6 (ref 5–15)
BUN: 26 mg/dL — ABNORMAL HIGH (ref 6–23)
CHLORIDE: 106 mmol/L (ref 96–112)
CO2: 27 mmol/L (ref 19–32)
CREATININE: 1.44 mg/dL — AB (ref 0.50–1.35)
Calcium: 9.7 mg/dL (ref 8.4–10.5)
GFR calc Af Amer: 54 mL/min — ABNORMAL LOW (ref >90)
GFR calc non Af Amer: 47 mL/min — ABNORMAL LOW (ref >90)
GLUCOSE: 92 mg/dL (ref 70–99)
POTASSIUM: 4 mmol/L (ref 3.5–5.1)
SODIUM: 139 mmol/L (ref 135–145)
TOTAL PROTEIN: 7.4 g/dL (ref 6.0–8.3)
Total Bilirubin: 0.6 mg/dL (ref 0.3–1.2)

## 2014-10-13 LAB — LIPASE, BLOOD: LIPASE: 26 U/L (ref 11–59)

## 2014-10-13 LAB — TROPONIN I

## 2014-10-13 MED ORDER — FAMOTIDINE IN NACL 20-0.9 MG/50ML-% IV SOLN
20.0000 mg | INTRAVENOUS | Status: AC
  Administered 2014-10-13: 20 mg via INTRAVENOUS
  Filled 2014-10-13: qty 50

## 2014-10-13 MED ORDER — GI COCKTAIL ~~LOC~~
30.0000 mL | Freq: Once | ORAL | Status: AC
  Administered 2014-10-13: 30 mL via ORAL
  Filled 2014-10-13: qty 30

## 2014-10-13 MED ORDER — PANTOPRAZOLE SODIUM 40 MG IV SOLR
40.0000 mg | INTRAVENOUS | Status: AC
  Administered 2014-10-13: 40 mg via INTRAVENOUS
  Filled 2014-10-13: qty 40

## 2014-10-13 MED ORDER — SUCRALFATE 1 G PO TABS: 1.0000 g | ORAL_TABLET | Freq: Three times a day (TID) | ORAL | Status: DC

## 2014-10-13 MED ORDER — RANITIDINE HCL 150 MG PO TABS: 150.0000 mg | ORAL_TABLET | Freq: Two times a day (BID) | ORAL | Status: DC

## 2014-10-13 MED ORDER — ASPIRIN 81 MG PO CHEW
324.0000 mg | CHEWABLE_TABLET | Freq: Once | ORAL | Status: AC
  Administered 2014-10-13: 324 mg via ORAL
  Filled 2014-10-13: qty 4

## 2014-10-13 NOTE — ED Notes (Signed)
Patient given discharge instruction, verbalized understand. IV removed, band aid applied. Patient ambulatory out of the department.  

## 2014-10-13 NOTE — Discharge Instructions (Signed)
Please call your doctor for a followup appointment within 24-48 hours. When you talk to your doctor please let them know that you were seen in the emergency department and have them acquire all of your records so that they can discuss the findings with you and formulate a treatment plan to fully care for your new and ongoing problems. ° °

## 2014-10-13 NOTE — ED Notes (Signed)
Pt with hx of ca of tongue and throat, reports he has started having chest pain that he describes as hot and radiating across his chest. Pt states he has some dizziness, SOB and nausea. Pt reports he has been taking Protonix but it no longer helps with the discomfort. Pt c/o pain only after eating.

## 2014-10-13 NOTE — ED Provider Notes (Addendum)
CSN: [id number]     Arrival date & time 10/13/14  1429 History   First MD Initiated Contact with Patient 10/13/14 1440     Chief Complaint  Patient presents with  . Chest Pain     (Consider location/radiation/quality/duration/timing/severity/associated sxs/prior Treatment) HPI Comments: The patient is a 74 year old male, he has a history significant for acid reflux which he terms severe, as well as coronary disease requiring a multi-vessel bypass over 10 years ago. He has a history of laryngeal cancer and has had radiation to his throat and neck. He states that over the last week he has been having increased episodes of chest pain after eating lunch and dinner, it usually last for approximately one and a half hours, it gradually resolves and finally goes away only to return after he eats again. It never occurs in the morning, it rarely occurs at night, he is able to exercise at the gym for approximately 90 minutes a day doing activities including stretching, weight lifting and some cardio activity walking on a treadmill. He describes these activities as making him very lightheaded requiring that he rests. He denies chest pain with those activities. He has no shortness of breath, cough, fever, chills, nausea, vomiting or swelling of the lower extremities. He does follow with a cardiologist, he does take Protonix but states that he feels it is not working. At this time he describes his pain as mild, a hot sensation across his chest. There is no radiation to the neck jaw or shoulder.  Review of the medical record shows that he last saw his cardiologist approximately 6 months ago, his last heart catheterization was in 2007 showing patent grafts, last stress test was nuclear in July 2012 which showed a small fixed apical defect and a normal ejection fraction. It is reported in the cardiology notes that the patient has a history of orthostatic hypotension and lightheadedness on occasion.  Patient is a 74 y.o.  male presenting with chest pain. The history is provided by the patient.  Chest Pain   Past Medical History  Diagnosis Date  . Ischemic heart disease   . Aortic stenosis, mild   . Hyperlipidemia   . Insomnia   . Fatigue   . Dizziness   . Hypotension     orthostatic  . History of echocardiogram 02/09/2009    EF -  55-60%  . History of cardiovascular stress test 03/14/2009    EF - 57%   /  mild anterior hyperkinesia with discrete anterior scar consistent with previous infarction but unchanged from previous studies.  Overall, stable findings -- [name], MD  . Weakness   . ASCVD (arteriosclerotic cardiovascular disease)   . Arrhythmia     known history of  . Weight loss, abnormal 2012  . 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 Protein-calorie malnutrition, moderate 2012    on PEG tube feeding  . Cellulitis of artificial external opening, post-operative 2012    of PEG tube site  . Mesenteric thrombosis   . 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 Coronary artery disease   . Thrombocytopenia, unspecified 06/10/2013   Past Surgical History  Procedure Laterality Date  . Coronary artery bypass graft  2000    x 4  . Cardiac catheterization  2007    EF - 55%  . Ablation of dysrhythmic focus    . Esophagogastroduodenoscopy  04/24/2011    Procedure: ESOPHAGOGASTRODUODENOSCOPY (EGD);  Surgeon:  Rogene Houston, MD;  Location: [facility];  Service: Endoscopy;  Laterality: N/A;  8:30 am  . Peg placement  04/24/2011    Procedure: PERCUTANEOUS ENDOSCOPIC GASTROSTOMY (PEG) PLACEMENT;  Surgeon: Rogene Houston, MD;  Location: [facility];  Service: Endoscopy;  Laterality: N/A;  . Esophagogastroduodenoscopy (egd) with esophageal dilation  09/02/2012    Procedure: ESOPHAGOGASTRODUODENOSCOPY (EGD) WITH ESOPHAGEAL DILATION;  Surgeon: Rogene Houston, MD;  Location: [facility];  Service: Endoscopy;  Laterality: N/A;  245  .  Esophageal dilation    . Esophagogastroduodenoscopy (egd) with esophageal dilation N/A 12/24/2012    Procedure: ESOPHAGOGASTRODUODENOSCOPY (EGD) WITH ESOPHAGEAL DILATION;  Surgeon: Rogene Houston, MD;  [facility]: [facility];  Service: Endoscopy;  Laterality: N/A;  850  . Colonoscopy with esophagogastroduodenoscopy (egd) N/A 04/14/2013    Procedure: COLONOSCOPY WITH ESOPHAGOGASTRODUODENOSCOPY (EGD);  Surgeon: Rogene Houston, MD;  Location: [facility];  Service: Endoscopy;  Laterality: N/A;  145  . Balloon dilation N/A 04/14/2013    Procedure: BALLOON DILATION;  Surgeon: Rogene Houston, MD;  Location: [facility];  Service: Endoscopy;  Laterality: N/A;  Venia Minks dilation N/A 04/14/2013    Procedure: Venia Minks DILATION;  Surgeon: Rogene Houston, MD;  Location: [facility];  Service: Endoscopy;  Laterality: N/A;  . Savory dilation N/A 04/14/2013    Procedure: SAVORY DILATION;  Surgeon: Rogene Houston, MD;  [facility]: [facility];  Service: Endoscopy;  Laterality: N/A;  . Esophagogastroduodenoscopy N/A 01/23/2014    Procedure: ESOPHAGOGASTRODUODENOSCOPY (EGD);  Surgeon: Rogene Houston, MD;  [facility]: [facility];  Service: Endoscopy;  Laterality: N/A;  730  . Balloon dilation N/A 01/23/2014    Procedure: BALLOON DILATION;  Surgeon: Rogene Houston, MD;  [facility]: [facility];  Service: Endoscopy;  Laterality: N/A;  Venia Minks dilation N/A 01/23/2014    Procedure: Venia Minks DILATION;  Surgeon: Rogene Houston, MD;  [facility]: [facility];  Service: Endoscopy;  Laterality: N/A;  . Savory dilation N/A 01/23/2014    Procedure: SAVORY DILATION;  Surgeon: Rogene Houston, MD;  [facility]: [facility];  Service: Endoscopy;  Laterality: N/A;   Family History  Problem Relation Age of Onset  . Heart disease Father   . Heart disease Brother   . Heart disease Sister   . Cancer Sister     breast cancer  . Cancer Mother     breast ca  . Hyperlipidemia Son    History  Substance Use Topics  . Smoking  status: Former Smoker    Types: Cigars    Quit date: 09/21/2009  . Smokeless tobacco: Former Systems developer    Quit date: 05/06/2011     Comment: 2 cigars a week  . Alcohol Use: No     Comment: occasionally glass of wine    Review of Systems  Cardiovascular: Positive for chest pain.  All other systems reviewed and are negative.     Allergies  Review of patient's allergies indicates no known allergies.  Home Medications   Prior to Admission medications   Medication Sig Start Date End Date Taking? Authorizing Provider  aspirin EC 81 MG tablet Take 81 mg by mouth at bedtime.    Yes Historical Provider, MD  cevimeline (EVOXAC) 30 MG capsule TAKE ONE CAPSULE BY MOUTH THREE TIMES DAILY. 07/03/14  Yes Heath Lark, MD  clindamycin (CLEOCIN) 150 MG capsule Take 150 mg by mouth 3 (three) times daily. 7 day course starting on 10/09/2014 10/09/14  Yes Historical Provider, MD  gabapentin (NEURONTIN) 100 MG capsule Take 100-200 mg by mouth 3 (three) times daily. Takes 1 capsule twice daily and 2 capsules at bedtime   Yes Historical Provider, MD  HYDROcodone-acetaminophen (NORCO/VICODIN) 5-325 MG per tablet Take 1 tablet by mouth every 4 (four) hours as needed. For pain 10/09/14  Yes Historical Provider, MD  levothyroxine (SYNTHROID, LEVOTHROID) 100 MCG tablet TAKE ONE TABLET BY MOUTH ONCE DAILY BEFORE  BREAKFAST   Yes Ni Gorsuch, MD  LORazepam (ATIVAN) 1 MG tablet TAKE 1 TABLET BY MOUTH THREE TIMES DAILY 09/05/14  Yes Rogene Houston, MD  pantoprazole (PROTONIX) 40 MG tablet Take 40 mg by mouth daily.  10/16/11  Yes Rogene Houston, MD  Probiotic Product (PROBIOTIC DAILY PO) Take by mouth.   Yes Historical Provider, MD  rosuvastatin (CRESTOR) 20 MG tablet Take 1 tablet (20 mg total) by mouth at bedtime. 09/28/14  Yes Peter M Martinique, MD  vitamin B-12 (CYANOCOBALAMIN) 1000 MCG tablet Take 1,000 mcg by mouth daily.   Yes Historical Provider, MD  zolpidem (AMBIEN) 10 MG tablet Take 10 mg by mouth at bedtime as  needed for sleep.    Yes Historical Provider, MD  ranitidine (ZANTAC) 150 MG tablet Take 1 tablet (150 mg total) by mouth 2 (two) times daily. 10/13/14   Johnna Acosta, MD  sucralfate (CARAFATE) 1 G tablet Take 1 tablet (1 g total) by mouth 4 (four) times daily -  with meals and at bedtime. 10/13/14   Johnna Acosta, MD   BP 136/69 mmHg  Pulse 56  Temp(Src) 98 F (36.7 C) (Oral)  Resp 15  Ht 6\' 1"  (1.854 m)  Wt 183 lb (83.008 kg)  BMI 24.15 kg/m2  SpO2 98% Physical Exam  Constitutional: He appears well-developed and well-nourished. No distress.  HENT:  Head: Normocephalic and atraumatic.  Mouth/Throat: Oropharynx is clear and moist. No oropharyngeal exudate.  Eyes: Conjunctivae and EOM are normal. Pupils are equal, round, and reactive to light. Right eye exhibits no discharge. Left eye exhibits no discharge. No scleral icterus.  Neck: Normal range of motion. Neck supple. No JVD present. No thyromegaly present.  No lymphadenopathy, very supple  Cardiovascular: Normal rate, regular rhythm and intact distal pulses.  Exam reveals no gallop and no friction rub.   Murmur ( soft systolic) heard. Pulmonary/Chest: Effort normal and breath sounds normal. No respiratory distress. He has no wheezes. He has no rales. He exhibits no tenderness (no tenderness over the chest wall).  Abdominal: Soft. Bowel sounds are normal. He exhibits no distension and no mass. There is no tenderness.  No abdominal tenderness to palpation  Musculoskeletal: Normal range of motion. He exhibits no edema or tenderness.  Lymphadenopathy:    He has no cervical adenopathy.  Neurological: He is alert. Coordination normal.  Skin: Skin is warm and dry. No rash noted. No erythema.  Psychiatric: He has a normal mood and affect. His behavior is normal.  Nursing note and vitals reviewed.   ED Course  Procedures (including critical care time) Labs Review Labs Reviewed  COMPREHENSIVE METABOLIC PANEL - Abnormal; Notable for  the following:    BUN 26 (*)    Creatinine, Ser 1.44 (*)    Alkaline Phosphatase 30 (*)    GFR calc non Af Amer 47 (*)    GFR calc Af Amer 54 (*)    All other components within normal limits  CBC WITH DIFFERENTIAL - Abnormal; Notable for the following:  RBC 4.12 (*)    Platelets 133 (*)    Monocytes Relative 13 (*)    All other components within normal limits  LIPASE, BLOOD  TROPONIN I    Imaging Review Dg Chest 2 View  10/13/2014   CLINICAL DATA:  Chest pain, epigastric pain  EXAM: CHEST  2 VIEW  COMPARISON:  11/26/ 13  FINDINGS: Cardiomediastinal silhouette is stable. No acute infiltrate or pleural effusion. No pulmonary edema. Status post median sternotomy. Degenerative changes mid and lower thoracic spine.  IMPRESSION: No active cardiopulmonary disease. Degenerative changes thoracic spine.   Electronically Signed   By: Lahoma Crocker M.D.   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 There is some concern regarding the escalation of the patient's symptoms though they do appear to be postprandial. We'll obtain troponin, chest x-ray, aspirin, increase antacid therapy.  Testing has been negative - pt informed of mild decrease in renal function - VS remain normal - pt is pain and sx free after meds as below - he is amenable to d/c - again with food related  post prandial pain in the chest and no abd ttp, this is likely GERD related.  Change home meds to reflect this.  Meds given in ED:  Medications  aspirin chewable tablet 324 mg (324 mg Oral Given 10/13/14 1530)  famotidine (PEPCID) IVPB 20 mg (0 mg Intravenous Stopped 10/13/14 1606)  pantoprazole (PROTONIX) injection 40 mg (40 mg Intravenous Given 10/13/14 1535)  gi cocktail (Maalox,Lidocaine,Donnatal) (30 mLs Oral Given 10/13/14 1530)    New Prescriptions   RANITIDINE (ZANTAC) 150 MG TABLET    Take 1 tablet (150 mg total) by mouth 2 (two) times daily.   SUCRALFATE (CARAFATE) 1 G TABLET    Take 1 tablet (1 g total) by mouth 4 (four) times daily -  with meals and at bedtime.    Johnna Acosta, MD 10/13/14 1747  Johnna Acosta, MD 10/13/14 [phone]

## 2014-10-16 ENCOUNTER — Telehealth: Payer: Self-pay | Admitting: Cardiology

## 2014-10-16 ENCOUNTER — Telehealth (INDEPENDENT_AMBULATORY_CARE_PROVIDER_SITE_OTHER): Payer: Self-pay | Admitting: *Deleted

## 2014-10-16 ENCOUNTER — Other Ambulatory Visit (INDEPENDENT_AMBULATORY_CARE_PROVIDER_SITE_OTHER): Payer: Self-pay | Admitting: Internal Medicine

## 2014-10-16 MED ORDER — PANTOPRAZOLE SODIUM 40 MG PO TBEC: 40.0000 mg | DELAYED_RELEASE_TABLET | Freq: Two times a day (BID) | ORAL | Status: DC

## 2014-10-16 NOTE — Telephone Encounter (Signed)
Returned call to patient he stated he went to [facility] ER last week with severe heart burn.Stated he was told to see Dr.Jordan.Stated he is feeling better.Surgeon advised he will need to see Dr.Jordan.Appointment scheduled with Dr.Jordan 10/18/14 at 2:15 pm.

## 2014-10-16 NOTE — Telephone Encounter (Signed)
Dr.[name] called the patient.

## 2014-10-16 NOTE — Telephone Encounter (Signed)
New Message     Patient needs surgical clearance for a procedure that he is having on 10/25/14 @1pm  to enlarge the esophagus.   Please give patient a call.

## 2014-10-16 NOTE — Progress Notes (Unsigned)
ER records reviewed. Talked with patient. He has EGD scheduled for 10/25/2014. Will increase pantoprazole to 40 mg by mouth twice a day. GI cocktail to stay tablespoon full daily when necessary. Patient also advised to see his cardiologist prior to EGD if possible.

## 2014-10-16 NOTE — Telephone Encounter (Signed)
Patient has a sever heartburn and reflux episode and went to the ER Friday.  They as he f/u with [place] and cardiology  He is supposed to have egd/ed 2/3.  Can someone call him and discuss all this with him.  He wants to make sure he understands and he should still have this procedure done.

## 2014-10-18 ENCOUNTER — Ambulatory Visit (INDEPENDENT_AMBULATORY_CARE_PROVIDER_SITE_OTHER): Payer: Medicare Other | Admitting: Cardiology

## 2014-10-18 ENCOUNTER — Encounter: Payer: Self-pay | Admitting: Cardiology

## 2014-10-18 VITALS — BP 110/70 | HR 55 | Ht 73.0 in | Wt 189.2 lb

## 2014-10-18 DIAGNOSIS — I259 Chronic ischemic heart disease, unspecified: Secondary | ICD-10-CM | POA: Diagnosis not present

## 2014-10-18 DIAGNOSIS — I350 Nonrheumatic aortic (valve) stenosis: Secondary | ICD-10-CM | POA: Diagnosis not present

## 2014-10-18 DIAGNOSIS — E7800 Pure hypercholesterolemia, unspecified: Secondary | ICD-10-CM

## 2014-10-18 NOTE — Patient Instructions (Signed)
We will schedule you for a nuclear stress test  I will see you in 6 months 

## 2014-10-19 ENCOUNTER — Other Ambulatory Visit (INDEPENDENT_AMBULATORY_CARE_PROVIDER_SITE_OTHER): Payer: Self-pay | Admitting: Internal Medicine

## 2014-10-19 NOTE — Progress Notes (Signed)
Subjective:   George Bradley is seen for follow up CAD He had coronary artery bypass grafting in 2000. His last catheterization was in 2007 and showed patent saphenous vein grafts x3 with a patent left internal mammary artery and well-preserved LV function. His EF is 55%. His last nuclear stress test in July of 2012 showed a small fixed apical defect with normal ejection fraction. He has a history of orthostatic hypotension and some lightheadedness on occasion. He is on no antihypertensive therapy. He's had a history of what sounds like a Maze procedure when he lived in [place]. His other problems include hyperlipidemia, insomnia, oral cancer, and a history of depression. 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 He has had esophageal dilitation in the past.  On followup today he is doing very well. He did go to the ED last Friday with severe reflux and burning. This was worse after eating. He is scheduled for EGD with dilation with his gastroenterologist. His symptoms are better on Zantac and Carafate. He loves to exercise and works out very vigorously. He often pushes himself to the limit- to the point where he can't breath and has to stop. He feels that he has to build himself up and that he can't do this unless he pushes himself beyond comfort. This concerns his wife a great deal.  Current Outpatient Prescriptions  Medication Sig Dispense Refill  . aspirin EC 81 MG tablet Take 81 mg by mouth at bedtime.     . cevimeline (EVOXAC) 30 MG capsule TAKE ONE CAPSULE BY MOUTH THREE TIMES DAILY. 270 capsule 1  . gabapentin (NEURONTIN) 100 MG capsule Take 100-200 mg by mouth 3 (three) times daily. Takes 1 capsule twice daily and 2 capsules at bedtime    . levothyroxine (SYNTHROID, LEVOTHROID) 100 MCG tablet TAKE ONE TABLET BY MOUTH ONCE DAILY BEFORE  BREAKFAST 90 tablet 0  . LORazepam (ATIVAN) 1 MG tablet TAKE 1 TABLET BY MOUTH THREE TIMES DAILY 270 tablet 0  .  Probiotic Product (PROBIOTIC DAILY PO) Take by mouth.    . ranitidine (ZANTAC) 150 MG tablet Take 1 tablet (150 mg total) by mouth 2 (two) times daily. 60 tablet 0  . rosuvastatin (CRESTOR) 20 MG tablet Take 1 tablet (20 mg total) by mouth at bedtime. 90 tablet 3  . sucralfate (CARAFATE) 1 G tablet Take 1 tablet (1 g total) by mouth 4 (four) times daily -  with meals and at bedtime. 60 tablet 1  . vitamin B-12 (CYANOCOBALAMIN) 1000 MCG tablet Take 1,000 mcg by mouth daily.    Marland Kitchen zolpidem (AMBIEN) 10 MG tablet Take 10 mg by mouth at bedtime as needed for sleep.     . pantoprazole (PROTONIX) 40 MG tablet TAKE 1 TABLET BY MOUTH TWICE DAILY BEFORE A MEAL 180 tablet 3   No current facility-administered medications for this visit.    No Known Allergies  Patient Active Problem List   Diagnosis Date Noted  . Constipation 03/23/2014  . Dysphagia 03/23/2014  . 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 Xerostomia 06/10/2013  . Thrombocytopenia, unspecified 06/10/2013  . S/P radiation therapy   . 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 Orthostatic hypotension 05/11/2012  . Cancer of base of tongue 11/17/2011  . Depression 10/01/2011  . 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 Ischemic heart disease 02/20/2011  . Hypercholesterolemia 02/20/2011  . Aortic valve stenosis, mild 02/20/2011    History  Smoking status  . Former Smoker  . Types: Cigars  . Quit date: 09/21/2009  Smokeless tobacco  . Former Systems developer  . Quit date: 05/06/2011    Comment: 2 cigars a week    History  Alcohol Use No    Comment: occasionally glass of wine    Family History  Problem Relation Age of Onset  . Heart disease Father   . Heart disease Brother   . Heart disease Sister   . Cancer Sister     breast cancer  . Cancer Mother     breast ca  . Hyperlipidemia Son     Review of Systems:   As noted in history of present illness.  All other systems  were reviewed and are negative.   Physical Exam:    BP 110/70 mmHg  Pulse 55  Ht 6\' 1"  (1.854 m)  Wt 189 lb 3.2 oz (85.821 kg)  BMI 24.97 kg/m2   The head is normocephalic and atraumatic.  Pupils are equally round and reactive to light.  Sclerae nonicteric.  Conjunctiva is clear.  Oropharynx is clear.   Neck is supple there are no masses.  Thyroid is not enlarged.  There is no lymphadenopathy.  Lungs are clear.  Chest is symmetric.  Heart shows a regular rate and rhythm.  S1 and S2 are normal.  There is a soft systolic outflow murmur.  Abdomen is soft normal bowel sounds. There is no organomegaly.   Extremities are without edema.  Peripheral pulses are adequate.  Neurologically intact.  Skin is warm and dry.  Laboratory data:  Ecg today shows sinus brady with rate 55 bpm. First degree AV block. LAD, RBBB. I have personally reviewed and interpreted this study.   Assessment / Plan:  1. Orthostatic hypotension. This has been a chronic issue. Patient is managing well with conservative measures. On no BP lowering drugs. Currently asymptomatic.  2.  Coronary disease status post CABG. Clinically asymptomatic. Negative myoview in 7/12 but now 15 years post bypass. I have recommended a stress Myoview. Continue risk factor modification. On ASA and statin. I have recommended moderate aerobic exercise but informed him that there is no evidence that more extreme exercise is beneficial and may cause harm.   3. 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 Currently cancer free.  4. Hyperlipidemia.   5. History of RAS - now normotensive.  6. Esophageal disease post radiation.

## 2014-10-24 DIAGNOSIS — K449 Diaphragmatic hernia without obstruction or gangrene: Secondary | ICD-10-CM | POA: Diagnosis not present

## 2014-10-24 DIAGNOSIS — K222 Esophageal obstruction: Secondary | ICD-10-CM | POA: Diagnosis not present

## 2014-10-24 DIAGNOSIS — R1310 Dysphagia, unspecified: Secondary | ICD-10-CM | POA: Diagnosis not present

## 2014-10-24 DIAGNOSIS — Z8719 Personal history of other diseases of the digestive system: Secondary | ICD-10-CM | POA: Diagnosis not present

## 2014-10-25 ENCOUNTER — Ambulatory Visit (HOSPITAL_COMMUNITY)
Admission: RE | Admit: 2014-10-25 | Discharge: 2014-10-25 | Disposition: A | Discharge status: Home / Self Care | Payer: Medicare Other | Source: Ambulatory Visit | Attending: Internal Medicine | Admitting: Internal Medicine

## 2014-10-25 ENCOUNTER — Encounter (HOSPITAL_COMMUNITY)
Admission: RE | Disposition: A | Discharge status: Home / Self Care | Payer: Self-pay | Source: Ambulatory Visit | Attending: Internal Medicine

## 2014-10-25 ENCOUNTER — Encounter (HOSPITAL_COMMUNITY): Payer: Self-pay

## 2014-10-25 DIAGNOSIS — Z87891 Personal history of nicotine dependence: Secondary | ICD-10-CM | POA: Diagnosis not present

## 2014-10-25 DIAGNOSIS — Z951 Presence of aortocoronary bypass graft: Secondary | ICD-10-CM | POA: Insufficient documentation

## 2014-10-25 DIAGNOSIS — R1310 Dysphagia, unspecified: Secondary | ICD-10-CM | POA: Diagnosis present

## 2014-10-25 DIAGNOSIS — K449 Diaphragmatic hernia without obstruction or gangrene: Secondary | ICD-10-CM | POA: Diagnosis not present

## 2014-10-25 DIAGNOSIS — Z85810 Personal history of malignant neoplasm of tongue: Secondary | ICD-10-CM | POA: Insufficient documentation

## 2014-10-25 DIAGNOSIS — E785 Hyperlipidemia, unspecified: Secondary | ICD-10-CM | POA: Insufficient documentation

## 2014-10-25 DIAGNOSIS — I259 Chronic ischemic heart disease, unspecified: Secondary | ICD-10-CM | POA: Insufficient documentation

## 2014-10-25 DIAGNOSIS — Z8249 Family history of ischemic heart disease and other diseases of the circulatory system: Secondary | ICD-10-CM | POA: Diagnosis not present

## 2014-10-25 DIAGNOSIS — I2510 Atherosclerotic heart disease of native coronary artery without angina pectoris: Secondary | ICD-10-CM | POA: Insufficient documentation

## 2014-10-25 DIAGNOSIS — E039 Hypothyroidism, unspecified: Secondary | ICD-10-CM | POA: Diagnosis not present

## 2014-10-25 DIAGNOSIS — Z85818 Personal history of malignant neoplasm of other sites of lip, oral cavity, and pharynx: Secondary | ICD-10-CM | POA: Diagnosis not present

## 2014-10-25 DIAGNOSIS — K222 Esophageal obstruction: Secondary | ICD-10-CM | POA: Diagnosis not present

## 2014-10-25 HISTORY — PX: ESOPHAGOGASTRODUODENOSCOPY: SHX5428

## 2014-10-25 HISTORY — PX: MALONEY DILATION: SHX5535

## 2014-10-25 SURGERY — EGD (ESOPHAGOGASTRODUODENOSCOPY)
Anesthesia: Moderate Sedation

## 2014-10-25 MED ORDER — MEPERIDINE HCL 50 MG/ML IJ SOLN
INTRAMUSCULAR | Status: DC | PRN
  Administered 2014-10-25 (×2): 25 mg via INTRAVENOUS

## 2014-10-25 MED ORDER — MIDAZOLAM HCL 5 MG/5ML IJ SOLN
INTRAMUSCULAR | Status: DC | PRN
  Administered 2014-10-25 (×2): 3 mg via INTRAVENOUS
  Administered 2014-10-25 (×2): 2 mg via INTRAVENOUS

## 2014-10-25 MED ORDER — SODIUM CHLORIDE 0.9 % IV SOLN
INTRAVENOUS | Status: DC
  Administered 2014-10-25: 14:00:00 via INTRAVENOUS

## 2014-10-25 MED ORDER — MEPERIDINE HCL 50 MG/ML IJ SOLN
INTRAMUSCULAR | Status: AC
  Filled 2014-10-25: qty 1

## 2014-10-25 MED ORDER — MIDAZOLAM HCL 5 MG/5ML IJ SOLN
INTRAMUSCULAR | Status: AC
  Filled 2014-10-25: qty 10

## 2014-10-25 MED ORDER — STERILE WATER FOR IRRIGATION IR SOLN
Status: DC | PRN
  Administered 2014-10-25: 14:00:00

## 2014-10-25 NOTE — H&P (Signed)
George Bradley is an 74 y.o. male.   Chief Complaint: Patient is here for EGD and ED. HPI: Patient is 74 year old Caucasian male with history of head and neck carcinoma treated with chemoradiation has remained in remission. He has developed proximal esophageal stricture has been dilated multiple times was recently in May 2015. He's been having progressive dysphagia over the last one month. He did have 10 day episode. Chest pain and heartburn he was seen in emergency room and workup was negative. He is scheduled for a stress is near future. He denies abdominal pain or melena.  Past Medical History  Diagnosis Date  . Ischemic heart disease   . Aortic stenosis, mild   . Hyperlipidemia   . Insomnia   . Fatigue   . Dizziness   . Hypotension     orthostatic  . History of echocardiogram 02/09/2009    EF -  55-60%  . History of cardiovascular stress test 03/14/2009    EF - 57%   /  mild anterior hyperkinesia with discrete anterior scar consistent with previous infarction but unchanged from previous studies.  Overall, stable findings -- [name], MD  . Weakness   . ASCVD (arteriosclerotic cardiovascular disease)   . Arrhythmia     known history of  . Weight loss, abnormal 2012  . 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 Protein-calorie malnutrition, moderate 2012    on PEG tube feeding  . Cellulitis of artificial external opening, post-operative 2012    of PEG tube site  . Mesenteric thrombosis   . 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 Coronary artery disease   . Thrombocytopenia, unspecified 06/10/2013    Past Surgical History  Procedure Laterality Date  . Coronary artery bypass graft  2000    x 4  . Cardiac catheterization  2007    EF - 55%  . Ablation of dysrhythmic focus    . Esophagogastroduodenoscopy  04/24/2011    Procedure: ESOPHAGOGASTRODUODENOSCOPY (EGD);  Surgeon: Rogene Houston, MD;  [facility];  Service: Endoscopy;  Laterality: N/A;  8:30 am  . Peg placement  04/24/2011    Procedure: PERCUTANEOUS ENDOSCOPIC GASTROSTOMY (PEG) PLACEMENT;  Surgeon: Rogene Houston, MD;  Location: [facility];  Service: Endoscopy;  Laterality: N/A;  . Esophagogastroduodenoscopy (egd) with esophageal dilation  09/02/2012    Procedure: ESOPHAGOGASTRODUODENOSCOPY (EGD) WITH ESOPHAGEAL DILATION;  Surgeon: Rogene Houston, MD;  [facility];  Service: Endoscopy;  Laterality: N/A;  245  . Esophageal dilation    . Esophagogastroduodenoscopy (egd) with esophageal dilation N/A 12/24/2012    Procedure: ESOPHAGOGASTRODUODENOSCOPY (EGD) WITH ESOPHAGEAL DILATION;  Surgeon: Rogene Houston, MD;  [facility];  Service: Endoscopy;  Laterality: N/A;  850  . Colonoscopy with esophagogastroduodenoscopy (egd) N/A 04/14/2013    Procedure: COLONOSCOPY WITH ESOPHAGOGASTRODUODENOSCOPY (EGD);  Surgeon: Rogene Houston, MD;  [facility];  Service: Endoscopy;  Laterality: N/A;  145  . Balloon dilation N/A 04/14/2013    Procedure: BALLOON DILATION;  Surgeon: Rogene Houston, MD;  [facility];  Service: Endoscopy;  Laterality: N/A;  Venia Minks dilation N/A 04/14/2013    Procedure: Venia Minks DILATION;  Surgeon: Rogene Houston, MD;  [facility];  Service: Endoscopy;  Laterality: N/A;  . Savory dilation N/A 04/14/2013    Procedure: SAVORY DILATION;  Surgeon: Rogene Houston, MD;  [facility];  Service:  Endoscopy;  Laterality: N/A;  . Esophagogastroduodenoscopy N/A 01/23/2014    Procedure: ESOPHAGOGASTRODUODENOSCOPY (EGD);  Surgeon: Rogene Houston, MD;  [facility];  Service: Endoscopy;  Laterality: N/A;  730  . Balloon dilation N/A 01/23/2014    Procedure: BALLOON DILATION;  Surgeon: Rogene Houston, MD;  [facility];  Service: Endoscopy;  Laterality: N/A;  Venia Minks dilation N/A 01/23/2014    Procedure: Venia Minks DILATION;  Surgeon: Rogene Houston,  MD;  [facility];  Service: Endoscopy;  Laterality: N/A;  . Savory dilation N/A 01/23/2014    Procedure: SAVORY DILATION;  Surgeon: Rogene Houston, MD;  [facility];  Service: Endoscopy;  Laterality: N/A;    Family History  Problem Relation Age of Onset  . Heart disease Father   . Heart disease Brother   . Heart disease Sister   . Cancer Sister     breast cancer  . Cancer Mother     breast ca  . Hyperlipidemia Son    Social History:  reports that he quit smoking about 5 years ago. His smoking use included Cigars. He quit smokeless tobacco use about 3 years ago. He reports that he does not drink alcohol or use illicit drugs.  Allergies: No Known Allergies  Medications Prior to Admission  Medication Sig Dispense Refill  . aspirin EC 81 MG tablet Take 81 mg by mouth at bedtime.     . cevimeline (EVOXAC) 30 MG capsule TAKE ONE CAPSULE BY MOUTH THREE TIMES DAILY. 270 capsule 1  . gabapentin (NEURONTIN) 100 MG capsule Take 100-200 mg by mouth 3 (three) times daily. Takes 1 capsule twice daily and 2 capsules at bedtime    . levothyroxine (SYNTHROID, LEVOTHROID) 100 MCG tablet TAKE ONE TABLET BY MOUTH ONCE DAILY BEFORE  BREAKFAST 90 tablet 0  . LORazepam (ATIVAN) 1 MG tablet TAKE 1 TABLET BY MOUTH THREE TIMES DAILY 270 tablet 0  . pantoprazole (PROTONIX) 40 MG tablet TAKE 1 TABLET BY MOUTH TWICE DAILY BEFORE A MEAL 180 tablet 3  . Probiotic Product (PROBIOTIC DAILY PO) Take by mouth.    . ranitidine (ZANTAC) 150 MG tablet Take 1 tablet (150 mg total) by mouth 2 (two) times daily. 60 tablet 0  . rosuvastatin (CRESTOR) 20 MG tablet Take 1 tablet (20 mg total) by mouth at bedtime. 90 tablet 3  . sucralfate (CARAFATE) 1 G tablet Take 1 tablet (1 g total) by mouth 4 (four) times daily -  with meals and at bedtime. 60 tablet 1  . vitamin B-12 (CYANOCOBALAMIN) 1000 MCG tablet Take 1,000 mcg by mouth daily.    Marland Kitchen zolpidem (AMBIEN) 10 MG tablet Take 10 mg by mouth at bedtime as  needed for sleep.       No results found for this or any previous visit (from the past 48 hour(s)). No results found.  ROS  Blood pressure 144/72, pulse 57, temperature 97.7 F (36.5 C), temperature source Oral, resp. rate 9, SpO2 100 %. Physical Exam  Constitutional: He appears well-developed and well-nourished.  HENT:  Mouth/Throat: Oropharynx is clear and moist.  Eyes: Conjunctivae are normal. No scleral icterus.  Neck: No thyromegaly present.  Cardiovascular: Normal rate, regular rhythm and normal heart sounds.   No murmur heard. Respiratory: Effort normal and breath sounds normal.  GI: Soft. He exhibits no distension and no mass. There is no tenderness.  Musculoskeletal: He exhibits no edema.  Lymphadenopathy:    He has no cervical adenopathy.  Neurological: He is alert.  Skin: Skin is warm and dry.     Assessment/Plan Solid food dysphagia. History of radiation-induced esophageal stricture. EGD with ED.  [name],[name] 10/25/2014, 2:53 PM

## 2014-10-25 NOTE — Discharge Instructions (Signed)
Resume usual medications and diet. No driving for 24 hours. Please call office with progress report in one week.  Gastrointestinal Endoscopy, Care After Refer to this sheet in the next few weeks. These instructions provide you with information on caring for yourself after your procedure. Your caregiver may also give you more specific instructions. Your treatment has been planned according to current medical practices, but problems sometimes occur. Call your caregiver if you have any problems or questions after your procedure. HOME CARE INSTRUCTIONS  If you were given medicine to help you relax (sedative), do not drive, operate machinery, or sign important documents for 24 hours.  Avoid alcohol and hot or warm beverages for the first 24 hours after the procedure.  Only take over-the-counter or prescription medicines for pain, discomfort, or fever as directed by your caregiver. You may resume taking your normal medicines unless your caregiver tells you otherwise. Ask your caregiver when you may resume taking medicines that may cause bleeding, such as aspirin, clopidogrel, or warfarin.  You may return to your normal diet and activities on the day after your procedure, or as directed by your caregiver. Walking may help to reduce any bloated feeling in your abdomen.  Drink enough fluids to keep your urine clear or pale yellow.  You may gargle with salt water if you have a sore throat. SEEK IMMEDIATE MEDICAL CARE IF:  You have severe nausea or vomiting.  You have severe abdominal pain, abdominal cramps that last longer than 6 hours, or abdominal swelling (distention).  You have severe shoulder or back pain.  You have trouble swallowing.  You have shortness of breath, your breathing is shallow, or you are breathing faster than normal.  You have a fever or a rapid heartbeat.  You vomit blood or material that looks like coffee grounds.  You have bloody, black, or tarry stools. MAKE SURE  YOU:  Understand these instructions.  Will watch your condition.  Will get help right away if you are not doing well or get worse. Document Released: 04/22/2004 Document Revised: 01/23/2014 Document Reviewed: 12/09/2011 [facility] Patient Information 2015 [place], [place]. This information is not intended to replace advice given to you by your health care provider. Make sure you discuss any questions you have with your health care provider.

## 2014-10-25 NOTE — Op Note (Signed)
EGD PROCEDURE REPORT  PATIENT:  George Bradley  MR#:  [id number] Birthdate:  December 28, 1940, 74 y.o., male Endoscopist:  Dr. Rogene Houston, MD  Procedure Date: 10/25/2014  Procedure:   EGD with E  Indications:  Patient is 74 year old Caucasian male who was radiation-induced proximal esophageal stricture which was last dilated in May 2015. He presents with one month history of progressive solid food dysphagia.           Informed Consent:  The risks, benefits, alternatives & imponderables which include, but are not limited to, bleeding, infection, perforation, drug reaction and potential missed lesion have been reviewed.  The potential for biopsy, lesion removal, esophageal dilation, etc. have also been discussed.  Questions have been answered.  All parties agreeable.  Please see history & physical in medical record for more information.  Medications:  Demerol 50 mg IV Versed 10 mg IV Cetacaine spray topically for oropharyngeal anesthesia  Description of procedure:  The endoscope was introduced through the mouth and advanced to the second portion of the duodenum without difficulty or limitations. The mucosal surfaces were surveyed very carefully during advancement of the scope and upon withdrawal.  Findings:  Esophagus:  Circumferential noncritical stricture noted at 20 cm from the incisors marked by pale mucosa. No mass or ulceration noted. Mucosa distal the stricture revealed coarse appearance. GE junction was unremarkable without ring or stricture formation. GEJ:  42 cm Hiatus:  44 cm Stomach: Stomach was empty and distended very well with insufflation. Folds in the proximal stomach were normal. Examination of mucosa at body, antrum, pyloric channel, angularis, fundus and cardia were normal. Duodenum:  Normal bulbar and post bulbar mucosa.  Therapeutic/Diagnostic Maneuvers Performed:   Esophagus was dilated by passing 48 and 52 [place] Maloney dilators to full insertion. Endoscope was passed  again and small linear mucosal disruption noted at the site of stricture. Subsequently 83 French Maloney dilator was passed to full insertion. As the dilators withdrawn endoscope was passed again. Once again mucosal disruption noted at site of stricture without any other abnormalities.  Complications:  None  Impression: Proximal esophageal stricture dilated by passing 48, 52 and 54 French Maloney dilator resulting in linear mucosal disruption. Small sliding hiatal hernia.  Recommendations:  Standard instructions given. Patient advised to call office with progress report in one week.  Jasilyn Holderman U  10/25/2014  3:25 PM  CC: Dr. Precious Reel, MD & Dr. Rayne Du ref. provider found

## 2014-10-26 ENCOUNTER — Encounter (HOSPITAL_COMMUNITY): Payer: Self-pay | Admitting: Internal Medicine

## 2014-10-26 ENCOUNTER — Telehealth (HOSPITAL_COMMUNITY): Payer: Self-pay

## 2014-10-26 NOTE — Telephone Encounter (Signed)
Encounter complete. 

## 2014-10-31 ENCOUNTER — Ambulatory Visit (HOSPITAL_COMMUNITY)
Admission: RE | Admit: 2014-10-31 | Discharge: 2014-10-31 | Disposition: A | Discharge status: Home / Self Care | Payer: Medicare Other | Source: Ambulatory Visit | Attending: Cardiology | Admitting: Cardiology

## 2014-10-31 DIAGNOSIS — I739 Peripheral vascular disease, unspecified: Secondary | ICD-10-CM | POA: Diagnosis not present

## 2014-10-31 DIAGNOSIS — E7800 Pure hypercholesterolemia, unspecified: Secondary | ICD-10-CM

## 2014-10-31 DIAGNOSIS — E785 Hyperlipidemia, unspecified: Secondary | ICD-10-CM | POA: Diagnosis not present

## 2014-10-31 DIAGNOSIS — I350 Nonrheumatic aortic (valve) stenosis: Secondary | ICD-10-CM | POA: Diagnosis not present

## 2014-10-31 DIAGNOSIS — Z8249 Family history of ischemic heart disease and other diseases of the circulatory system: Secondary | ICD-10-CM | POA: Insufficient documentation

## 2014-10-31 DIAGNOSIS — I259 Chronic ischemic heart disease, unspecified: Secondary | ICD-10-CM

## 2014-10-31 DIAGNOSIS — Z87891 Personal history of nicotine dependence: Secondary | ICD-10-CM | POA: Diagnosis not present

## 2014-10-31 DIAGNOSIS — R002 Palpitations: Secondary | ICD-10-CM | POA: Insufficient documentation

## 2014-10-31 DIAGNOSIS — I2510 Atherosclerotic heart disease of native coronary artery without angina pectoris: Secondary | ICD-10-CM | POA: Insufficient documentation

## 2014-10-31 DIAGNOSIS — R42 Dizziness and giddiness: Secondary | ICD-10-CM | POA: Insufficient documentation

## 2014-10-31 DIAGNOSIS — R079 Chest pain, unspecified: Secondary | ICD-10-CM | POA: Diagnosis not present

## 2014-10-31 MED ORDER — TECHNETIUM TC 99M SESTAMIBI GENERIC - CARDIOLITE
31.0000 | Freq: Once | INTRAVENOUS | Status: AC | PRN
  Administered 2014-10-31: 31 via INTRAVENOUS

## 2014-10-31 MED ORDER — TECHNETIUM TC 99M SESTAMIBI GENERIC - CARDIOLITE
10.3000 | Freq: Once | INTRAVENOUS | Status: AC | PRN
  Administered 2014-10-31: 10 via INTRAVENOUS

## 2014-10-31 NOTE — Procedures (Addendum)
[facility] [address] [place] [address] [phone]  Cardiology Nuclear Med Study  EDWEN MCLESTER is a 74 y.o. male     MRN : [id number]     DOB: 1941/04/25  Procedure Date: 10/31/2014  Nuclear Med Background Indication for Stress Test:  [facility] and Follow up CAD History:  CAD;CABG X4-2000;Last NUC MPI in 03/2011;EF=55%;Aortic valve stenosis;Last CATH in 2007-patent grafts. Cardiac Risk Factors: Family History - CAD, History of Smoking, Lipids and PVD  Symptoms:  Chest Pain, Dizziness, Light-Headedness and Palpitations   Nuclear Pre-Procedure Caffeine/Decaff Intake:  12:00am NPO After: 10am   IV Site: R Forearm  IV 0.9% NS with Angio Cath:  22g  Chest Size (in):  44"  IV Started by: Rolene Course, RN  Height: 6\' 1"  (1.854 m)  Cup Size: n/a  BMI:  Body mass index is 24.94 kg/(m^2). Weight:  189 lb (85.73 kg)   Tech Comments:  n/a    Nuclear Med Study 1 or 2 day study: 1 day  Stress Test Type:  Stress  Order Authorizing Provider:  Peter Martinique, MD   Resting Radionuclide: Technetium 32m Sestamibi  Resting Radionuclide Dose: 10.3 mCi   Stress Radionuclide:  Technetium 22m Sestamibi  Stress Radionuclide Dose: 31.0 mCi           Stress Protocol Rest HR: 73 Stress HR: 150  Rest BP: 102/75 Stress BP: 164/90  Exercise Time (min): 10 METS: 10.60   Predicted Max HR: 147 bpm % Max HR: 102.04 bpm Rate Pressure Product: 24600  Dose of Adenosine (mg):  n/a Dose of Lexiscan: n/a mg  Dose of Atropine (mg): n/a Dose of Dobutamine: n/a mcg/kg/min (at max HR)  Stress Test Technologist: Mellody Memos, CCT Nuclear Technologist: Imagene Riches, CNMT   Rest Procedure:  Myocardial perfusion imaging was performed at rest 45 minutes following the intravenous administration of Technetium 45m Sestamibi. Stress Procedure:  The patient performed treadmill exercise using a Bruce  Protocol for 10 minutes. The patient stopped due to  shortness of breath and fatigue.  Patient denied any chest pain.  There were no significant ST-T wave changes.  Technetium 70m Sestamibi was injected IV at peak exercise and myocardial perfusion imaging was performed after a brief delay.  Transient Ischemic Dilatation (Normal <1.22):  0.88  QGS EDV:  91 ml QGS ESV:  43 ml LV Ejection Fraction: 53%        Rest ECG: NSR-RBBB  Stress ECG: No significant ST segment change suggestive of ischemia.  QPS Raw Data Images:  Normal; no motion artifact; normal heart/lung ratio. Stress Images:  There is decreased uptake in the apex. Rest Images:  There is decreased uptake in the apex. Subtraction (SDS):  No evidence of ischemia.  Impression Exercise Capacity:  Good exercise capacity. BP Response:  Normal blood pressure response. Clinical Symptoms:  No significant symptoms noted. ECG Impression:  No significant ST segment change suggestive of ischemia. Comparison with Prior Nuclear Study: No significant change from previous study  Overall Impression:  Low risk stress nuclear study Small fixed apical defect.  LV Wall Motion:  NL LV Function; NL Wall Motion   Lorretta Harp, MD  10/31/2014 4:54 PM

## 2014-12-06 ENCOUNTER — Other Ambulatory Visit (INDEPENDENT_AMBULATORY_CARE_PROVIDER_SITE_OTHER): Payer: Self-pay | Admitting: Internal Medicine

## 2014-12-25 ENCOUNTER — Other Ambulatory Visit: Payer: Self-pay | Admitting: Hematology and Oncology

## 2014-12-26 ENCOUNTER — Telehealth: Payer: Self-pay | Admitting: *Deleted

## 2014-12-26 ENCOUNTER — Encounter: Payer: Self-pay | Admitting: Hematology and Oncology

## 2014-12-26 NOTE — Progress Notes (Signed)
I faxed prior auth to [facility] for Cevimeline 30mg  capsules

## 2014-12-26 NOTE — Progress Notes (Signed)
Per Dr. Alvy Bimler she has no other options for the patient other than Cevimeline. His insurance plan doesn't cover. I will let the [facility] know   [phone]

## 2014-12-26 NOTE — Telephone Encounter (Signed)
[employer] called to inform insurance denial for evoxac. Message sent to [facility].

## 2014-12-26 NOTE — Progress Notes (Signed)
I sent staff message/email to Dr. Alvy Bimler and nurse to advised the patient's plan will not cover Cevimeline and to see if she wants to try something else.

## 2014-12-28 ENCOUNTER — Ambulatory Visit (HOSPITAL_BASED_OUTPATIENT_CLINIC_OR_DEPARTMENT_OTHER): Payer: Medicare Other | Admitting: Hematology and Oncology

## 2014-12-28 ENCOUNTER — Other Ambulatory Visit (HOSPITAL_BASED_OUTPATIENT_CLINIC_OR_DEPARTMENT_OTHER): Payer: Medicare Other

## 2014-12-28 ENCOUNTER — Encounter: Payer: Self-pay | Admitting: Hematology and Oncology

## 2014-12-28 VITALS — BP 114/79 | HR 68 | Temp 98.4°F | Resp 18 | Ht 73.0 in | Wt 184.2 lb

## 2014-12-28 DIAGNOSIS — E039 Hypothyroidism, unspecified: Secondary | ICD-10-CM

## 2014-12-28 DIAGNOSIS — K117 Disturbances of salivary secretion: Secondary | ICD-10-CM

## 2014-12-28 DIAGNOSIS — D696 Thrombocytopenia, unspecified: Secondary | ICD-10-CM

## 2014-12-28 DIAGNOSIS — C01 Malignant neoplasm of base of tongue: Secondary | ICD-10-CM

## 2014-12-28 DIAGNOSIS — E038 Other specified hypothyroidism: Secondary | ICD-10-CM

## 2014-12-28 DIAGNOSIS — R1310 Dysphagia, unspecified: Secondary | ICD-10-CM

## 2014-12-28 DIAGNOSIS — R682 Dry mouth, unspecified: Secondary | ICD-10-CM

## 2014-12-28 LAB — COMPREHENSIVE METABOLIC PANEL (CC13)
ALT: 29 U/L (ref 0–55)
ANION GAP: 8 meq/L (ref 3–11)
AST: 31 U/L (ref 5–34)
Albumin: 4.2 g/dL (ref 3.5–5.0)
Alkaline Phosphatase: 33 U/L — ABNORMAL LOW (ref 40–150)
BILIRUBIN TOTAL: 0.4 mg/dL (ref 0.20–1.20)
BUN: 25.9 mg/dL (ref 7.0–26.0)
CO2: 25 mEq/L (ref 22–29)
CREATININE: 1.4 mg/dL — AB (ref 0.7–1.3)
Calcium: 9.5 mg/dL (ref 8.4–10.4)
Chloride: 108 mEq/L (ref 98–109)
EGFR: 51 mL/min/{1.73_m2} — AB (ref >90)
Glucose: 93 mg/dl (ref 70–140)
Potassium: 4.5 mEq/L (ref 3.5–5.1)
Sodium: 141 mEq/L (ref 136–145)
Total Protein: 7.3 g/dL (ref 6.4–8.3)

## 2014-12-28 LAB — CBC WITH DIFFERENTIAL/PLATELET
BASO%: 0.2 % (ref 0.0–2.0)
Basophils Absolute: 0 10*3/uL (ref 0.0–0.1)
EOS%: 0.7 % (ref 0.0–7.0)
Eosinophils Absolute: 0 10*3/uL (ref 0.0–0.5)
HCT: 41.3 % (ref 38.4–49.9)
HEMOGLOBIN: 14.1 g/dL (ref 13.0–17.1)
LYMPH#: 1.6 10*3/uL (ref 0.9–3.3)
LYMPH%: 29.1 % (ref 14.0–49.0)
MCH: 32.8 pg (ref 27.2–33.4)
MCHC: 34.1 g/dL (ref 32.0–36.0)
MCV: 96 fL (ref 79.3–98.0)
MONO#: 0.6 10*3/uL (ref 0.1–0.9)
MONO%: 10.3 % (ref 0.0–14.0)
NEUT%: 59.7 % (ref 39.0–75.0)
NEUTROS ABS: 3.4 10*3/uL (ref 1.5–6.5)
Platelets: 126 10*3/uL — ABNORMAL LOW (ref 140–400)
RBC: 4.3 10*6/uL (ref 4.20–5.82)
RDW: 13.9 % (ref 11.0–14.6)
WBC: 5.6 10*3/uL (ref 4.0–10.3)

## 2014-12-28 LAB — TSH CHCC: TSH: 1.337 m(IU)/L (ref 0.320–4.118)

## 2014-12-28 LAB — T4, FREE: FREE T4: 1.28 ng/dL (ref 0.80–1.80)

## 2014-12-28 MED ORDER — PILOCARPINE HCL 7.5 MG PO TABS: 7.5000 mg | ORAL_TABLET | Freq: Three times a day (TID) | ORAL | Status: DC

## 2014-12-28 NOTE — Assessment & Plan Note (Signed)
Clinically, he has no signs of recurrence. The patient wants to return here yearly for checkup. I will continue to see him every your in the spring with history, physical examination and blood work. I reinforced the importance of ENT follow-up.

## 2014-12-28 NOTE — Progress Notes (Signed)
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 PET/CT scan showed no evidence of distant metastatic disease however he does have evidence of lymphadenopathy. He was offered concurrent chemoradiation therapy with weekly cisplatin between August to October 2012. From 2012 to 2014 he had serial imaging studies which show no evidence of disease recurrence.  INTERVAL HISTORY: Please see below for problem oriented charting. He has multiple complaints today. He complained of poor energy, persistent dysphagia requiring dilatation, dry mouth, peripheral neuropathy and constipation.  he has not seen ENT for a while. He continued to see his dentist regularly.  REVIEW OF SYSTEMS:   Constitutional: Denies fevers, chills or abnormal weight loss Eyes: Denies blurriness of vision Ears, nose, mouth, throat, and face: Denies mucositis or sore throat Respiratory: Denies cough, dyspnea or wheezes Cardiovascular: Denies palpitation, chest discomfort or lower extremity swelling Skin: Denies abnormal skin rashes Lymphatics: Denies new lymphadenopathy or easy bruising Neurological:Denies numbness, tingling or new weaknesses Behavioral/Psych: Mood is stable, no new changes  All other systems were reviewed with the patient and are negative.  I have reviewed the past medical history, past surgical history, social history and family history with the patient and they are unchanged from previous note.  ALLERGIES:  has No Known Allergies.  MEDICATIONS:  Current Outpatient Prescriptions  Medication Sig Dispense Refill  . aspirin EC 81 MG tablet Take 81 mg by mouth at bedtime.     . cevimeline (EVOXAC) 30  MG capsule TAKE 1 CAPSULE BY MOUTH THREE TIMES DAILY 270 capsule 0  . gabapentin (NEURONTIN) 100 MG capsule Take 100-200 mg by mouth 3 (three) times daily. Takes 1 capsule twice daily and 2 capsules at bedtime    . levothyroxine (SYNTHROID, LEVOTHROID) 100 MCG tablet TAKE ONE TABLET BY MOUTH ONCE DAILY BEFORE  BREAKFAST 90 tablet 0  . LORazepam (ATIVAN) 1 MG tablet TAKE 1 TABLET BY MOUTH THREE TIMES DAILY 270 tablet 0  . pantoprazole (PROTONIX) 40 MG tablet TAKE 1 TABLET BY MOUTH TWICE DAILY BEFORE A MEAL 180 tablet 3  . rosuvastatin (CRESTOR) 20 MG tablet Take 1 tablet (20 mg total) by mouth at bedtime. 90 tablet 3  . vitamin B-12 (CYANOCOBALAMIN) 1000 MCG tablet Take 1,000 mcg by mouth daily.    . pilocarpine (SALAGEN) 7.5 MG tablet Take 1 tablet (7.5 mg total) by mouth 3 (three) times daily. 90 tablet 3   No current facility-administered medications for this visit.    PHYSICAL EXAMINATION: ECOG PERFORMANCE STATUS: 1 - Symptomatic but completely ambulatory  Filed Vitals:   12/28/14 1424  BP: 114/79  Pulse: 68  Temp: 98.4 F (36.9 C)  Resp: 18   Filed Weights   12/28/14 1424  Weight: 184 lb 3.2 oz (83.553 kg)    GENERAL:alert, no distress and comfortable SKIN: skin color, texture, turgor are normal, no rashes or significant lesions EYES: normal, Conjunctiva are pink and non-injected, sclera clear OROPHARYNX:no exudate, no erythema and lips, buccal mucosa, and tongue normal . Poor dentition is noted. NECK: neck is thick and fibrosis from prior treatment. LYMPH:  no palpable lymphadenopathy in the cervical, axillary or inguinal LUNGS: clear to auscultation and percussion with normal breathing effort HEART: regular rate & rhythm and  no murmurs and no lower extremity edema ABDOMEN:abdomen soft, non-tender and normal bowel sounds Musculoskeletal:no cyanosis of digits and no clubbing  NEURO: alert & oriented x 3 with fluent speech, no focal motor/sensory deficits  LABORATORY DATA:   I have reviewed the data as listed    Component Value Date/Time   NA 141 12/28/2014 1408   NA 139 10/13/2014 1538   NA 140 03/31/2012 0838   K 4.5 12/28/2014 1408   K 4.0 10/13/2014 1538   K 4.5 03/31/2012 0838   CL 106 10/13/2014 1538   CL 106 11/25/2012 0812   CL 100 03/31/2012 0838   CO2 25 12/28/2014 1408   CO2 27 10/13/2014 1538   CO2 31 03/31/2012 0838   GLUCOSE 93 12/28/2014 1408   GLUCOSE 92 10/13/2014 1538   GLUCOSE 102* 11/25/2012 0812   GLUCOSE 99 03/31/2012 0838   BUN 25.9 12/28/2014 1408   BUN 26* 10/13/2014 1538   BUN 19 03/31/2012 0838   CREATININE 1.4* 12/28/2014 1408   CREATININE 1.44* 10/13/2014 1538   CREATININE 1.26 08/02/2012 1208   CALCIUM 9.5 12/28/2014 1408   CALCIUM 9.7 10/13/2014 1538   CALCIUM 9.4 03/31/2012 0838   PROT 7.3 12/28/2014 1408   PROT 7.4 10/13/2014 1538   PROT 7.5 03/31/2012 0838   ALBUMIN 4.2 12/28/2014 1408   ALBUMIN 4.3 10/13/2014 1538   AST 31 12/28/2014 1408   AST 34 10/13/2014 1538   AST 30 03/31/2012 0838   ALT 29 12/28/2014 1408   ALT 32 10/13/2014 1538   ALT 29 03/31/2012 0838   ALKPHOS 33* 12/28/2014 1408   ALKPHOS 30* 10/13/2014 1538   ALKPHOS 36 03/31/2012 0838   BILITOT 0.40 12/28/2014 1408   BILITOT 0.6 10/13/2014 1538   BILITOT 0.70 03/31/2012 0838   GFRNONAA 47* 10/13/2014 1538   GFRAA 54* 10/13/2014 1538    No results found for: SPEP, UPEP  Lab Results  Component Value Date   WBC 5.6 12/28/2014   NEUTROABS 3.4 12/28/2014   HGB 14.1 12/28/2014   HCT 41.3 12/28/2014   MCV 96.0 12/28/2014   PLT 126* 12/28/2014      Chemistry      Component Value Date/Time   NA 141 12/28/2014 1408   NA 139 10/13/2014 1538   NA 140 03/31/2012 0838   K 4.5 12/28/2014 1408   K 4.0 10/13/2014 1538   K 4.5 03/31/2012 0838   CL 106 10/13/2014 1538   CL 106 11/25/2012 0812   CL 100 03/31/2012 0838   CO2 25 12/28/2014 1408   CO2 27 10/13/2014 1538   CO2 31 03/31/2012 0838   BUN 25.9 12/28/2014 1408   BUN 26*  10/13/2014 1538   BUN 19 03/31/2012 0838   CREATININE 1.4* 12/28/2014 1408   CREATININE 1.44* 10/13/2014 1538   CREATININE 1.26 08/02/2012 1208      Component Value Date/Time   CALCIUM 9.5 12/28/2014 1408   CALCIUM 9.7 10/13/2014 1538   CALCIUM 9.4 03/31/2012 0838   ALKPHOS 33* 12/28/2014 1408   ALKPHOS 30* 10/13/2014 1538   ALKPHOS 36 03/31/2012 0838   AST 31 12/28/2014 1408   AST 34 10/13/2014 1538   AST 30 03/31/2012 0838   ALT 29 12/28/2014 1408   ALT 32 10/13/2014 1538   ALT 29 03/31/2012 0838   BILITOT 0.40 12/28/2014 1408   BILITOT 0.6 10/13/2014 1538   BILITOT 0.70 03/31/2012 0838      ASSESSMENT & PLAN:  Cancer of base of tongue Clinically, he  has no signs of recurrence. The patient wants to return here yearly for checkup. I will continue to see him every your in the spring with history, physical examination and blood work. I reinforced the importance of ENT follow-up.   Dysphagia This is related to side effects of prior radiation. He has esophageal dilatation recently that seems to help. He denies recent choking or aspiration.   Hypothyroid Repeat thyroid function test is pending. I will continue to adjust his medication as needed.   Thrombocytopenia This is likely related to prior treatment. He is not symptomatic. Continue observation.   Xerostomia I recommend a trial of Salagen. His insurance company refused to pay Evoxac This contributed to constipation but the patient wants to continue.      Orders Placed This Encounter  Procedures  . CBC with Differential/Platelet    Standing Status: Future     Number of Occurrences:      Standing Expiration Date: 02/01/2016  . Comprehensive metabolic panel    Standing Status: Future     Number of Occurrences:      Standing Expiration Date: 02/01/2016  . TSH    Standing Status: Future     Number of Occurrences:      Standing Expiration Date: 02/01/2016   All questions were answered. The patient knows  to call the clinic with any problems, questions or concerns. No barriers to learning was detected. I spent 15 minutes counseling the patient face to face. The total time spent in the appointment was 20 minutes and more than 50% was on counseling and review of test results     [facility], [place], MD 12/28/2014 2:50 PM

## 2014-12-28 NOTE — Assessment & Plan Note (Signed)
This is related to side effects of prior radiation. He has esophageal dilatation recently that seems to help. He denies recent choking or aspiration.

## 2014-12-28 NOTE — Assessment & Plan Note (Signed)
I recommend a trial of Salagen. His insurance company refused to pay Evoxac This contributed to constipation but the patient wants to continue.

## 2014-12-28 NOTE — Assessment & Plan Note (Signed)
Repeat thyroid function test is pending. I will continue to adjust his medication as needed.

## 2014-12-28 NOTE — Assessment & Plan Note (Signed)
This is likely related to prior treatment. He is not symptomatic. Continue observation.

## 2014-12-29 ENCOUNTER — Telehealth: Payer: Self-pay | Admitting: Hematology and Oncology

## 2014-12-29 NOTE — Telephone Encounter (Signed)
s.w. pt and advised on April 2017 appt.Marland KitchenMarland Kitchen

## 2015-01-08 DIAGNOSIS — I2510 Atherosclerotic heart disease of native coronary artery without angina pectoris: Secondary | ICD-10-CM | POA: Diagnosis not present

## 2015-01-08 DIAGNOSIS — Z125 Encounter for screening for malignant neoplasm of prostate: Secondary | ICD-10-CM | POA: Diagnosis not present

## 2015-01-08 DIAGNOSIS — E785 Hyperlipidemia, unspecified: Secondary | ICD-10-CM | POA: Diagnosis not present

## 2015-01-08 DIAGNOSIS — E039 Hypothyroidism, unspecified: Secondary | ICD-10-CM | POA: Diagnosis not present

## 2015-01-08 DIAGNOSIS — Z0000 Encounter for general adult medical examination without abnormal findings: Secondary | ICD-10-CM | POA: Diagnosis not present

## 2015-01-10 ENCOUNTER — Encounter (INDEPENDENT_AMBULATORY_CARE_PROVIDER_SITE_OTHER): Payer: Self-pay | Admitting: *Deleted

## 2015-01-15 DIAGNOSIS — N183 Chronic kidney disease, stage 3 (moderate): Secondary | ICD-10-CM | POA: Diagnosis not present

## 2015-01-15 DIAGNOSIS — Z6823 Body mass index (BMI) 23.0-23.9, adult: Secondary | ICD-10-CM | POA: Diagnosis not present

## 2015-01-15 DIAGNOSIS — M1990 Unspecified osteoarthritis, unspecified site: Secondary | ICD-10-CM | POA: Diagnosis not present

## 2015-01-15 DIAGNOSIS — Z23 Encounter for immunization: Secondary | ICD-10-CM | POA: Diagnosis not present

## 2015-01-15 DIAGNOSIS — Z0000 Encounter for general adult medical examination without abnormal findings: Secondary | ICD-10-CM | POA: Diagnosis not present

## 2015-01-15 DIAGNOSIS — R1319 Other dysphagia: Secondary | ICD-10-CM | POA: Diagnosis not present

## 2015-01-15 DIAGNOSIS — Z1389 Encounter for screening for other disorder: Secondary | ICD-10-CM | POA: Diagnosis not present

## 2015-01-15 DIAGNOSIS — G629 Polyneuropathy, unspecified: Secondary | ICD-10-CM | POA: Diagnosis not present

## 2015-01-15 DIAGNOSIS — D696 Thrombocytopenia, unspecified: Secondary | ICD-10-CM | POA: Diagnosis not present

## 2015-01-15 DIAGNOSIS — N401 Enlarged prostate with lower urinary tract symptoms: Secondary | ICD-10-CM | POA: Diagnosis not present

## 2015-01-15 DIAGNOSIS — R682 Dry mouth, unspecified: Secondary | ICD-10-CM | POA: Diagnosis not present

## 2015-01-15 DIAGNOSIS — E039 Hypothyroidism, unspecified: Secondary | ICD-10-CM | POA: Diagnosis not present

## 2015-02-07 ENCOUNTER — Telehealth (INDEPENDENT_AMBULATORY_CARE_PROVIDER_SITE_OTHER): Payer: Self-pay | Admitting: *Deleted

## 2015-02-07 NOTE — Telephone Encounter (Signed)
Patient called the office and states that he could not understand as to why we had handled his PA for the Pantoprazole. A paper request had been recd from [facility]. I called 3 different numbers before it was the correct number. [phone]. I spoke with [place], she reviewed and she states that they had called the physician office on 02/05/15 and was told that Dr.[name] was no longer in practice nor patient a patient there.  Patient had provided the wrong office number, Marylee Floras. [employer] no longer does Pa over phone they are sending a fax for [place] to complete. It appears to be a quantity issue.  Patient was called and made aware of the problem. He was apologetic. He has called and will get #14 of Pantoprazole from pharmacy $1.50 Patient will be notified when we hear from Insurance.

## 2015-02-12 DIAGNOSIS — Z1212 Encounter for screening for malignant neoplasm of rectum: Secondary | ICD-10-CM | POA: Diagnosis not present

## 2015-03-04 ENCOUNTER — Other Ambulatory Visit (INDEPENDENT_AMBULATORY_CARE_PROVIDER_SITE_OTHER): Payer: Self-pay | Admitting: Internal Medicine

## 2015-03-08 ENCOUNTER — Encounter: Payer: Self-pay | Admitting: Hematology and Oncology

## 2015-03-08 NOTE — Progress Notes (Signed)
RECEIVED A VOICE MAIL FROM PT. AT 11:13AM CONCERNING THE CEVIMELINE. HE WOULD LIKE A PRESCRIPTION FOR #270 TABLETS WHICH WOULD BE A THREE MONTH SUPPLY WITH THREE REFILLS WHICH WOULD BE A YEAR SUPPLY SENT TO [place] IN [place].

## 2015-03-08 NOTE — Progress Notes (Signed)
I faxed [facility]  [phone] for Cevimeline hcl 30mg 

## 2015-03-08 NOTE — Progress Notes (Signed)
I placed form from [employer] on exception req for cevimeline hcl 30mg  on desk of nurse for dr. Alvy Bimler

## 2015-03-12 ENCOUNTER — Telehealth: Payer: Self-pay | Admitting: *Deleted

## 2015-03-12 ENCOUNTER — Encounter: Payer: Self-pay | Admitting: Hematology and Oncology

## 2015-03-12 NOTE — Telephone Encounter (Signed)
INFORM PT. THAT THE PRIOR AUTHORIZATION FOR CEVIMELINE WAS FAXED TO [employer] ON 03/08/15 AT 1:59 PM. THERE HAS NOT BEEN A RESPONSE TO MANAGED CARE BY [employer] SO THE PRESCRIPTION CAN NOT BE E-SCRIBED TO [facility]. PT. VOICES UNDERSTANDING.

## 2015-03-12 NOTE — Progress Notes (Signed)
I faxed prior auth for  Cevimeline to [facility]

## 2015-03-13 ENCOUNTER — Encounter: Payer: Self-pay | Admitting: Hematology and Oncology

## 2015-03-13 NOTE — Progress Notes (Signed)
Per [facility] cevimeline hcl 30mg  capsules approved Effective from 03/12/2015 through 03/11/2016.

## 2015-03-14 ENCOUNTER — Telehealth: Payer: Self-pay | Admitting: *Deleted

## 2015-03-14 MED ORDER — CEVIMELINE HCL 30 MG PO CAPS: 30.0000 mg | ORAL_CAPSULE | Freq: Three times a day (TID) | ORAL | Status: DC

## 2015-03-14 NOTE — Telephone Encounter (Signed)
PLs order 30 days supply with 6 refills

## 2015-03-14 NOTE — Telephone Encounter (Signed)
Pt needs Rx for Cevimeline 30 mg #270 with 3 refills sent to [facility] in [place].   It has been approved by his insurance company.

## 2015-03-14 NOTE — Telephone Encounter (Signed)
Pt called informing triage nurse that [place] has approved Cevimeline.  Pt stated he would need script sent in to [facility] in [place]. Pt's  Phone   [phone].

## 2015-03-14 NOTE — Telephone Encounter (Signed)
Rx sent to [facility].  Notified pt and he verbalized understanding.

## 2015-04-23 ENCOUNTER — Ambulatory Visit (INDEPENDENT_AMBULATORY_CARE_PROVIDER_SITE_OTHER): Payer: Medicare Other | Admitting: Internal Medicine

## 2015-04-23 ENCOUNTER — Encounter (INDEPENDENT_AMBULATORY_CARE_PROVIDER_SITE_OTHER): Payer: Self-pay | Admitting: Internal Medicine

## 2015-04-23 VITALS — BP 110/70 | HR 66 | Temp 97.9°F | Resp 18 | Ht 73.0 in | Wt 179.8 lb

## 2015-04-23 DIAGNOSIS — K219 Gastro-esophageal reflux disease without esophagitis: Secondary | ICD-10-CM | POA: Diagnosis not present

## 2015-04-23 DIAGNOSIS — G8929 Other chronic pain: Secondary | ICD-10-CM | POA: Diagnosis not present

## 2015-04-23 DIAGNOSIS — R1013 Epigastric pain: Secondary | ICD-10-CM | POA: Diagnosis not present

## 2015-04-23 DIAGNOSIS — K222 Esophageal obstruction: Secondary | ICD-10-CM

## 2015-04-23 DIAGNOSIS — I259 Chronic ischemic heart disease, unspecified: Secondary | ICD-10-CM | POA: Diagnosis not present

## 2015-04-23 NOTE — Patient Instructions (Signed)
Call if swallowing difficulty worsens.

## 2015-04-23 NOTE — Progress Notes (Signed)
Presenting complaint;  Follow-up for dysphagia and chronic epigastric pain.  Subjective:  George Bradley is 74 year old Caucasian male who presents for scheduled visit. He was last seen in the office in June 2015 but he underwent EGD with EGD in February this year. He has proximal esophageal stricture secondary to radiation therapy and has undergone multiple dilations. He says his swallowing is not as good as it was few months ago but it is not bad enough for him to undergo dilation. He says heartburns well controlled with therapy. He does not have abdominal pain as long as he stays on the rise up and. He does not have an appetite but forces himself to eat and has been able to maintain his weight. He complains of arthralgias involving both hands. He was tried on Celebrex 100 mg every other day for about 2 months but it did not help his symptoms. He did not experience any GI side effects either. He states his bowels move daily as long as he takes MiraLAX. Denies melena or rectal bleeding.   Current Medications: Outpatient Encounter Prescriptions as of 04/23/2015  Medication Sig  . aspirin EC 81 MG tablet Take 81 mg by mouth at bedtime.   . cevimeline (EVOXAC) 30 MG capsule Take 1 capsule (30 mg total) by mouth 3 (three) times daily.  Marland Kitchen gabapentin (NEURONTIN) 100 MG capsule Take 100-200 mg by mouth 3 (three) times daily. Takes 1 capsule twice daily and 2 capsules at bedtime  . levothyroxine (SYNTHROID, LEVOTHROID) 100 MCG tablet TAKE ONE TABLET BY MOUTH ONCE DAILY BEFORE  BREAKFAST  . LORazepam (ATIVAN) 1 MG tablet TAKE 1 TABLET BY MOUTH THREE TIMES DAILY  . pantoprazole (PROTONIX) 40 MG tablet TAKE 1 TABLET BY MOUTH TWICE DAILY BEFORE A MEAL  . polyethylene glycol (MIRALAX / GLYCOLAX) packet Take 17 g by mouth daily as needed.  . rosuvastatin (CRESTOR) 20 MG tablet Take 1 tablet (20 mg total) by mouth at bedtime.  . vitamin B-12 (CYANOCOBALAMIN) 1000 MCG tablet Take 1,000 mcg by mouth daily.  . [DISCONTINUED]  pilocarpine (SALAGEN) 7.5 MG tablet Take 1 tablet (7.5 mg total) by mouth 3 (three) times daily. (Patient not taking: Reported on 04/23/2015)   No facility-administered encounter medications on file as of 04/23/2015.    Objective: Blood pressure 110/70, pulse 66, temperature 97.9 F (36.6 C), temperature source Oral, resp. rate 18, height 6\' 1"  (1.854 m), weight 179 lb 12.8 oz (81.557 kg). Patient is alert and in no acute distress. Conjunctiva is pink. Sclera is nonicteric Oropharyngeal mucosa is normal. No neck masses or thyromegaly noted. Cardiac exam with regular rhythm normal S1 and S2. Faint SEM noted at left sternal border. Lungs are clear to auscultation. Abdomen is flat. Bowel sounds are normal without bruit. On palpation abdomen is soft and nontender without organomegaly or masses.  No LE edema or clubbing noted.  Labs/studies Results: Lab data from 12/28/2014   WBC 5.6, H&H 14.1 and 41.3 and platelet count 126K Serum sodium 141, potassium 4.5, chloride 108, CO2 25, BUN 25.9 and creatinine 1.4. Bilirubin 0.4, AP 33, AST 31, ALT 29, total protein 7.3, albumen 4.2 and serum calcium 9.5.   Assessment:  #1. 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 He has required periodic dilation and interval between dilations has increased. He is having mild dysphagia and will monitor for now. #2. GERD. Heartburn is well controlled with therapy. #3. 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 #4. 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 Condition was diagnosed 4 years ago.   Plan:  Patient will call when prescription for lorazepam needs to be renewed. Patient will call when dysphagia worsens and he needs esophageal dilation. Office visit in one year.

## 2015-05-17 ENCOUNTER — Ambulatory Visit (INDEPENDENT_AMBULATORY_CARE_PROVIDER_SITE_OTHER): Payer: Medicare Other | Admitting: Cardiology

## 2015-05-17 ENCOUNTER — Encounter: Payer: Self-pay | Admitting: Cardiology

## 2015-05-17 VITALS — BP 122/74 | HR 64 | Ht 73.0 in | Wt 182.6 lb

## 2015-05-17 DIAGNOSIS — I259 Chronic ischemic heart disease, unspecified: Secondary | ICD-10-CM

## 2015-05-17 DIAGNOSIS — E7800 Pure hypercholesterolemia, unspecified: Secondary | ICD-10-CM

## 2015-05-17 DIAGNOSIS — I951 Orthostatic hypotension: Secondary | ICD-10-CM

## 2015-05-17 NOTE — Progress Notes (Signed)
Subjective:   George Bradley is seen for follow up CAD.  He had coronary artery bypass grafting in 2000. His last catheterization was in 2007 and showed patent saphenous vein grafts x3 with a patent left internal mammary artery and well-preserved LV function. His EF is 55%. His last nuclear stress test in 2/16 showed a small fixed apical defect with normal ejection fraction. No ischemia. He has a history of orthostatic hypotension and some lightheadedness on occasion. He is on no antihypertensive therapy. He's had a history of what sounds like a Maze procedure when he lived in [place]. His other problems include hyperlipidemia, insomnia, oral cancer, and a history of depression. 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 He has had esophageal dilitation about every 6 months. On followup today he is doing very well.  He loves to exercise and works out very vigorously. He lifts a lot of weights. No chest pain or SOB.   Current Outpatient Prescriptions  Medication Sig Dispense Refill  . aspirin EC 81 MG tablet Take 81 mg by mouth at bedtime.     . cevimeline (EVOXAC) 30 MG capsule Take 1 capsule (30 mg total) by mouth 3 (three) times daily. 270 capsule 3  . gabapentin (NEURONTIN) 100 MG capsule Take 100-200 mg by mouth 3 (three) times daily. Takes 1 capsule twice daily and 2 capsules at bedtime    . levothyroxine (SYNTHROID, LEVOTHROID) 100 MCG tablet TAKE ONE TABLET BY MOUTH ONCE DAILY BEFORE  BREAKFAST 90 tablet 0  . LORazepam (ATIVAN) 1 MG tablet TAKE 1 TABLET BY MOUTH THREE TIMES DAILY 270 tablet 0  . pantoprazole (PROTONIX) 40 MG tablet TAKE 1 TABLET BY MOUTH TWICE DAILY BEFORE A MEAL 180 tablet 3  . polyethylene glycol (MIRALAX / GLYCOLAX) packet Take 17 g by mouth daily as needed.    . rosuvastatin (CRESTOR) 20 MG tablet Take 1 tablet (20 mg total) by mouth at bedtime. 90 tablet 3  . vitamin B-12 (CYANOCOBALAMIN) 1000 MCG tablet Take 1,000 mcg by mouth  daily.     No current facility-administered medications for this visit.    No Known Allergies  Patient Active Problem List   Diagnosis Date Noted  . Constipation 03/23/2014  . Dysphagia 03/23/2014  . 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 Xerostomia 06/10/2013  . Thrombocytopenia 06/10/2013  . S/P radiation therapy   . 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 Orthostatic hypotension 05/11/2012  . Cancer of base of tongue 11/17/2011  . Depression 10/01/2011  . 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 Ischemic heart disease 02/20/2011  . Hypercholesterolemia 02/20/2011  . Aortic valve stenosis, mild 02/20/2011    History  Smoking status  . Former Smoker  . Types: Cigars  . Quit date: 09/21/2009  Smokeless tobacco  . Former Systems developer  . Quit date: 05/06/2011    Comment: 2 cigars a week    History  Alcohol Use No    Comment: occasionally glass of wine    Family History  Problem Relation Age of Onset  . Heart disease Father   . Heart disease Brother   . Heart disease Sister   . Cancer Sister     breast cancer  . Cancer Mother     breast ca  . Hyperlipidemia Son     Review of Systems:   As noted in history of present illness.  All other systems were reviewed and are negative.  Physical Exam:    BP 122/74 mmHg  Pulse 64  Ht 6\' 1"  (1.854 m)  Wt 82.827 kg (182 lb 9.6 oz)  BMI 24.10 kg/m2   The head is normocephalic and atraumatic.  Pupils are equally round and reactive to light.  Sclerae nonicteric.  Conjunctiva is clear.  Oropharynx is clear.   Neck is supple there are no masses.  Thyroid is not enlarged.  There is no lymphadenopathy.  Lungs are clear.  Chest is symmetric.  Heart shows a regular rate and rhythm.  S1 and S2 are normal.  There is a soft systolic outflow murmur.  Abdomen is soft normal bowel sounds. There is no organomegaly.   Extremities are without edema.  Peripheral pulses are adequate.   Neurologically intact.  Skin is warm and dry.  Laboratory data:  Cardiology Nuclear Med Study  George Bradley is a 74 y.o. male MRN : [id number] DOB: 11-24-1940  Procedure Date: 10/31/2014  Nuclear Med Background Indication for Stress Test: [facility] and Follow up CAD History: CAD;CABG X4-2000;Last NUC MPI in 03/2011;EF=55%;Aortic valve stenosis;Last CATH in 2007-patent grafts. Cardiac Risk Factors: Family History - CAD, History of Smoking, Lipids and PVD  Symptoms: Chest Pain, Dizziness, Light-Headedness and Palpitations   Nuclear Pre-Procedure Caffeine/Decaff Intake: 12:00am NPO After: 10am   IV Site: R Forearm  IV 0.9% NS with Angio Cath: 22g  Chest Size (in): 44"  IV Started by: Rolene Course, RN  Height: 6\' 1"  (1.854 m)  Cup Size: n/a  BMI: Body mass index is 24.94 kg/(m^2). Weight: 189 lb (85.73 kg)   Tech Comments: n/a    Nuclear Med Study 1 or 2 day study: 1 day  Stress Test Type: Stress  Order Authorizing Provider: Zoi Devine [place], MD   Resting Radionuclide: Technetium 77m Sestamibi  Resting Radionuclide Dose: 10.3 mCi   Stress Radionuclide: Technetium 8m Sestamibi  Stress Radionuclide Dose: 31.0 mCi      Stress Protocol Rest HR: 73 Stress HR: 150  Rest BP: 102/75 Stress BP: 164/90  Exercise Time (min): 10 METS: 10.60   Predicted Max HR: 147 bpm % Max HR: 102.04 bpm Rate Pressure Product: 24600  Dose of Adenosine (mg): n/a Dose of Lexiscan: n/a mg  Dose of Atropine (mg): n/a Dose of Dobutamine: n/a mcg/kg/min (at max HR)  Stress Test Technologist: Mellody Memos, CCT Nuclear Technologist: Imagene Riches, CNMT   Rest Procedure: Myocardial perfusion imaging was performed at rest 45 minutes following the intravenous administration of Technetium 9m Sestamibi. Stress Procedure: The patient performed treadmill exercise using a Bruce Protocol for 10 minutes. The patient stopped due to shortness of breath and  fatigue. Patient denied any chest pain. There were no significant ST-T wave changes. Technetium 13m Sestamibi was injected IV at peak exercise and myocardial perfusion imaging was performed after a brief delay.  Transient Ischemic Dilatation (Normal <1.22): 0.88  QGS EDV: 91 ml QGS ESV: 43 ml LV Ejection Fraction: 53%            Assessment / Plan:  1. Orthostatic hypotension. This has been a chronic issue. Patient is managing well with conservative measures. On no BP lowering drugs. Currently asymptomatic.  2.  Coronary disease status post CABG. Clinically asymptomatic. Negative myoview in 2/16. Continue risk factor modification. On ASA and statin. Continue  aerobic exercise.  3. 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 Currently cancer free.  4. Hyperlipidemia.   5. History of RAS - now normotensive.  6. Esophageal disease post  radiation.

## 2015-05-17 NOTE — Patient Instructions (Signed)
Continue your current therapy  I will see you in 6 months.   

## 2015-06-04 ENCOUNTER — Other Ambulatory Visit (INDEPENDENT_AMBULATORY_CARE_PROVIDER_SITE_OTHER): Payer: Self-pay | Admitting: Internal Medicine

## 2015-09-03 ENCOUNTER — Other Ambulatory Visit (INDEPENDENT_AMBULATORY_CARE_PROVIDER_SITE_OTHER): Payer: Self-pay | Admitting: Internal Medicine

## 2015-09-04 NOTE — Telephone Encounter (Signed)
I think this is for u.

## 2015-09-04 NOTE — Telephone Encounter (Signed)
Patient wants to know

## 2015-09-05 ENCOUNTER — Ambulatory Visit (HOSPITAL_COMMUNITY): Payer: Medicare Other | Attending: Internal Medicine | Admitting: Physical Therapy

## 2015-09-05 DIAGNOSIS — R293 Abnormal posture: Secondary | ICD-10-CM | POA: Insufficient documentation

## 2015-09-05 DIAGNOSIS — L905 Scar conditions and fibrosis of skin: Secondary | ICD-10-CM | POA: Insufficient documentation

## 2015-09-05 DIAGNOSIS — R52 Pain, unspecified: Secondary | ICD-10-CM

## 2015-09-05 NOTE — Patient Instructions (Signed)
Scapular Retraction (Standing)    With arms at sides, pinch shoulder blades together. Repeat ____ times per set. Do ____ sets per session. Do ____ sessions per day.  http://orth.exer.us/944   Copyright  VHI. All rights reserved.  Flexibility: Neck Retraction    Pull head straight back, keeping eyes and jaw level. Repeat _10___ times per set. Do _1___ sets per session. Do _3___ sessions per day.  http://orth.exer.us/344   Copyright  VHI. All rights reserved.  Flexibility: Neck Retraction    Pull head straight back, keeping eyes and jaw level. Repeat __10__ times per set. Do __1__ sets per session. Do __3__ sessions per day.  http://orth.exer.us/344   Copyright  VHI. All rights reserved.

## 2015-09-05 NOTE — Therapy (Signed)
[place], [place], [address] Phone: [phone]   Fax:  [phone]  Physical Therapy Evaluation  Patient Details  Name: George Bradley MRN: [id number] Date of Birth: 06/14/1941 Referring Provider: Virgina Jock  Encounter Date: 09/05/2015      PT End of Session - 09/05/15 1103    Visit Number 1   Number of Visits 12   Date for PT Re-Evaluation 10/05/15   Authorization Type medicare   Authorization - Visit Number 1   Authorization - Number of Visits 10   PT Start Time 0935   PT Stop Time 1015   PT Time Calculation (min) 40 min   Activity Tolerance Patient tolerated treatment well   Behavior During Therapy [facility] for tasks assessed/performed      Past Medical History  Diagnosis Date  . Ischemic heart disease   . Aortic stenosis, mild   . Hyperlipidemia   . Insomnia   . Fatigue   . Dizziness   . Hypotension     orthostatic  . History of echocardiogram 02/09/2009    EF -  55-60%  . History of cardiovascular stress test 03/14/2009    EF - 57%   /  mild anterior hyperkinesia with discrete anterior scar consistent with previous infarction but unchanged from previous studies.  Overall, stable findings -- [name], MD  . Weakness   . ASCVD (arteriosclerotic cardiovascular disease)   . Arrhythmia     known history of  . Weight loss, abnormal 2012  . 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 Protein-calorie malnutrition, moderate 2012    on PEG tube feeding  . Cellulitis of artificial external opening, post-operative 2012    of PEG tube site  . Mesenteric thrombosis   . 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 Coronary artery disease   . Thrombocytopenia, unspecified 06/10/2013    Past Surgical History  Procedure Laterality Date  . Coronary artery bypass graft  2000    x 4  . Cardiac catheterization  2007    EF - 55%  . Ablation of dysrhythmic  focus    . Esophagogastroduodenoscopy  04/24/2011    Procedure: ESOPHAGOGASTRODUODENOSCOPY (EGD);  Surgeon: Rogene Houston, MD;  [facility];  Service: Endoscopy;  Laterality: N/A;  8:30 am  . Peg placement  04/24/2011    Procedure: PERCUTANEOUS ENDOSCOPIC GASTROSTOMY (PEG) PLACEMENT;  Surgeon: Rogene Houston, MD;  [facility];  Service: Endoscopy;  Laterality: N/A;  . Esophagogastroduodenoscopy (egd) with esophageal dilation  09/02/2012    Procedure: ESOPHAGOGASTRODUODENOSCOPY (EGD) WITH ESOPHAGEAL DILATION;  Surgeon: Rogene Houston, MD;  [facility];  Service: Endoscopy;  Laterality: N/A;  245  . Esophageal dilation    . Esophagogastroduodenoscopy (egd) with esophageal dilation N/A 12/24/2012    Procedure: ESOPHAGOGASTRODUODENOSCOPY (EGD) WITH ESOPHAGEAL DILATION;  Surgeon: Rogene Houston, MD;  [facility];  Service: Endoscopy;  Laterality: N/A;  850  . Colonoscopy with esophagogastroduodenoscopy (egd) N/A 04/14/2013    Procedure: COLONOSCOPY WITH ESOPHAGOGASTRODUODENOSCOPY (EGD);  Surgeon: Rogene Houston, MD;  [facility];  Service: Endoscopy;  Laterality: N/A;  145  . Balloon dilation N/A 04/14/2013    Procedure: BALLOON DILATION;  Surgeon: Rogene Houston, MD;  [facility];  Service: Endoscopy;  Laterality: N/A;  Venia Minks dilation N/A 04/14/2013    Procedure: Venia Minks DILATION;  Surgeon: Mechele Dawley  Laural Golden, MD;  [facility];  Service: Endoscopy;  Laterality: N/A;  . Savory dilation N/A 04/14/2013    Procedure: SAVORY DILATION;  Surgeon: Rogene Houston, MD;  [facility];  Service: Endoscopy;  Laterality: N/A;  . Esophagogastroduodenoscopy N/A 01/23/2014    Procedure: ESOPHAGOGASTRODUODENOSCOPY (EGD);  Surgeon: Rogene Houston, MD;  [facility];  Service: Endoscopy;  Laterality: N/A;  730  . Balloon dilation N/A 01/23/2014    Procedure: BALLOON DILATION;  Surgeon: Rogene Houston, MD;  [facility];  Service: Endoscopy;  Laterality: N/A;  Venia Minks dilation N/A 01/23/2014    Procedure: Venia Minks DILATION;  Surgeon: Rogene Houston, MD;  [facility];  Service: Endoscopy;  Laterality: N/A;  . Savory dilation N/A 01/23/2014    Procedure: SAVORY DILATION;  Surgeon: Rogene Houston, MD;  [facility];  Service: Endoscopy;  Laterality: N/A;  . Esophagogastroduodenoscopy N/A 10/25/2014    Procedure: ESOPHAGOGASTRODUODENOSCOPY (EGD);  Surgeon: Rogene Houston, MD;  [facility];  Service: Endoscopy;  Laterality: N/A;  855 - moved to 2/3 @ 2:00  . Maloney dilation N/A 10/25/2014    Procedure: Venia Minks DILATION;  Surgeon: Rogene Houston, MD;  [facility];  Service: Endoscopy;  Laterality: N/A;    There were no vitals filed for this visit.  Visit Diagnosis:  Pain in surgical scar - Plan: PT plan of care cert/re-cert  Poor posture - Plan: PT plan of care cert/re-cert      Subjective Assessment - 09/05/15 1022    Subjective George Bradley had a CABG in 2000.  He states that he had some "pimples " that popped up approximately 7 years after his CABG; he went to the dermatolgist who, " burnt" the pimples off.  After this he had increased pain.  He began to have increased pain along his scar at this time.  The 'Pimples" returned with a repeat of the proceduce which increased his pain even more.  He was diagnosed with throat cancer four years ago and he had both radication and chemo which seemed to exacerbate his pain even more.  He states that even more distressing to him is his stomach pain.  He states due to complications of his cancer treatment they had to move his stomach and he has had significant pain ever since.        Multiple Pain Sites Yes   Pain Score 5  will go as high as a 10/10   Pain Location Abdomen   Pain Orientation Anterior   Pain Descriptors / Indicators Burning;Throbbing;Sharp   Pain Onset More than a month ago   Pain Frequency  Constant   Aggravating Factors  unsure   Pain Relieving Factors unsure             [facility] PT Assessment - 09/05/15 0947    Assessment   Medical Diagnosis scar pain   Referring Provider Virgina Jock   Onset Date/Surgical Date 08/24/15   Prior Therapy none   Precautions   Precautions None   Restrictions   Weight Bearing Restrictions No   Balance Screen   Has the patient fallen in the past 6 months No   Has the patient had a decrease in activity level because of a fear of falling?  No   Is the patient reluctant to leave their home because of a fear of falling?  No   Prior Function   Level of Independence Independent  Cognition   Overall Cognitive Status Within Functional Limits for tasks assessed   Observation/Other Assessments   Observations Pt exhibits hypertrophic scaring along CABG insicion;  He exhibits two small surgical incisions in the upper abdomen that have reverse puckering. indicative of myofascial adhesions.     Focus on Therapeutic Outcomes (FOTO)  51   Posture/Postural Control   Posture/Postural Control Postural limitations   Postural Limitations Rounded Shoulders;Forward head;Decreased lumbar lordosis;Increased thoracic kyphosis   ROM / Strength   AROM / PROM / Strength --  ROM WFL                   [facility] Adult PT Treatment/Exercise - 09/05/15 0947    Exercises   Exercises Neck   Neck Exercises: Seated   Neck Retraction 10 reps   Postural Training verbalized importance of    Other Seated Exercise scapular retraction x 10   Modalities   Modalities --  Laser: mm/ligament chronic continuous; time [id number] x 5 applica                PT Education - 09/05/15 1056    Education provided Yes   Education Details HEP, desensitization progrm    Person(s) Educated Patient   Methods Explanation;Handout;Demonstration   Comprehension Verbalized understanding;Returned demonstration          PT Short Term Goals - 09/05/15 1113    PT SHORT TERM GOAL #1    Title I in HEP to assist in proper posture to decrease constriction of soft tissue to improve pain    Time 2   Period Weeks   Status New   PT SHORT TERM GOAL #2   Title PT to state pain is at the most a 7/10 to improve pt quality of life.    Time 2   Period Weeks   Status New           PT Long Term Goals - 09/05/15 1114    PT LONG TERM GOAL #1   Title PT I in advance HEP to address desensitization of scar and self scar mobilization to decrease pain to improve pt quality of life    Time 4   Period Weeks   Status New   PT LONG TERM GOAL #2   Title Pt pain to be at the most a 5/10 to allow pt to wear a t-shirt wthout discomfort    Time 4   Period Weeks               Plan - 09/05/15 1103    Clinical Impression Statement GeorgeCordaro is a 74 yo male who has had surgery  for a CABG in 2000  as well as a surgery to move his stomach due to chronic pain issues from radiation and chemo following throat cancer four years ago.  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 Both cause him constant pain that will go up to a 10/10 multiple times a day which is decreasing his quality of life.  He has tried multiple medications without any success therefore he has opted to try skilled physical therapy to decrease his pain and improve his quality of life.    Pt will benefit from skilled therapeutic intervention in order to improve on the following deficits Decreased scar mobility;Increased fascial restricitons;Postural dysfunction;Pain   Rehab Potential Good   PT Frequency 3x / week   PT Duration 4 weeks  PT Treatment/Interventions ADLs/Self Care Home Management;Iontophoresis 4mg /ml Dexamethasone;Manual techniques;Therapeutic exercise;Scar mobilization;Patient/family education   PT Next Visit Plan Continue with Laser to CABG scar as well as saline iontophoresis if order is back;  Begin myofascial  release techinques to stomach scars.  Begin Supine B flexion and abduction to assist in skin/scar mobility.    PT Home Exercise Plan given   Consulted and Agree with Plan of Care Patient          G-Codes - 2015-09-14 1115    Functional Assessment Tool Used foto   Functional Limitation Other PT primary   Other PT Primary Current Status [id number]) At least 20 percent but less than 40 percent impaired, limited or restricted   Other PT Primary Goal Status [id number]) At least 20 percent but less than 40 percent impaired, limited or restricted       Problem List Patient Active Problem List   Diagnosis Date Noted  . Constipation 03/23/2014  . Dysphagia 03/23/2014  . 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 Xerostomia 06/10/2013  . Thrombocytopenia ([place]) 06/10/2013  . S/P radiation therapy   . 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 Orthostatic hypotension 05/11/2012  . Cancer of base of tongue ([place]) 11/17/2011  . Depression 10/01/2011  . Renal artery stenosis, native, bilateral ([place]) 09/03/2011  . 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 Ischemic heart disease 02/20/2011  . Hypercholesterolemia 02/20/2011  . Aortic valve stenosis, mild 02/20/2011    Rayetta Humphrey, PT CLT [phone] 2015/09/14, 11:20 AM  [place] [address] [place], [place], [address] Phone: [phone]   Fax:  [phone]  Name: KYSIR CHIVERS MRN: [id number] Date of Birth: Nov 15, 1940

## 2015-09-06 ENCOUNTER — Ambulatory Visit (HOSPITAL_COMMUNITY): Payer: Medicare Other | Admitting: Physical Therapy

## 2015-09-06 DIAGNOSIS — R293 Abnormal posture: Secondary | ICD-10-CM | POA: Diagnosis not present

## 2015-09-06 DIAGNOSIS — L905 Scar conditions and fibrosis of skin: Secondary | ICD-10-CM | POA: Diagnosis not present

## 2015-09-06 DIAGNOSIS — R52 Pain, unspecified: Principal | ICD-10-CM

## 2015-09-06 NOTE — Patient Instructions (Signed)
ROM: Abduction (Standing)   While on your back  Bring arms straight out from sides and raise as high as possible without pain. Repeat 5-10____ times per set. Do __1__ sets per session. Do __2__ sessions per day.  http://orth.exer.us/910   Copyright  VHI. All rights reserved.  ROM: Flexion (Standing)    Bring arms straight out in front and raise as high as possible without pain. Keep palms facing up. Repeat 5-_10___ times per set. Do ___1_ sets per session. Do ___2_ sessions per day.  http://orth.exer.us/908   Copyright  VHI. All rights reserved.

## 2015-09-06 NOTE — Therapy (Signed)
[place], [place], [address] Phone: [phone]   Fax:  [phone]  Physical Therapy Treatment  Patient Details  Name: George Bradley MRN: [id number] Date of Birth: 07/30/1941 Referring Provider: Virgina Jock  Encounter Date: 09/06/2015      PT End of Session - 09/06/15 0913    Visit Number 2   Number of Visits 12   Date for PT Re-Evaluation 10/05/15   Authorization Type medicare   Authorization - Visit Number 2   Authorization - Number of Visits 10   PT Start Time 0825   PT Stop Time 0907   PT Time Calculation (min) 42 min   Activity Tolerance Patient tolerated treatment well   Behavior During Therapy [facility] for tasks assessed/performed      Past Medical History  Diagnosis Date  . Ischemic heart disease   . Aortic stenosis, mild   . Hyperlipidemia   . Insomnia   . Fatigue   . Dizziness   . Hypotension     orthostatic  . History of echocardiogram 02/09/2009    EF -  55-60%  . History of cardiovascular stress test 03/14/2009    EF - 57%   /  mild anterior hyperkinesia with discrete anterior scar consistent with previous infarction but unchanged from previous studies.  Overall, stable findings -- [name], MD  . Weakness   . ASCVD (arteriosclerotic cardiovascular disease)   . Arrhythmia     known history of  . Weight loss, abnormal 2012  . 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 Protein-calorie malnutrition, moderate 2012    on PEG tube feeding  . Cellulitis of artificial external opening, post-operative 2012    of PEG tube site  . Mesenteric thrombosis   . 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 Coronary artery disease   . Thrombocytopenia, unspecified 06/10/2013    Past Surgical History  Procedure Laterality Date  . Coronary artery bypass graft  2000    x 4  . Cardiac catheterization  2007    EF - 55%  . Ablation of dysrhythmic  focus    . Esophagogastroduodenoscopy  04/24/2011    Procedure: ESOPHAGOGASTRODUODENOSCOPY (EGD);  Surgeon: Rogene Houston, MD;  [facility];  Service: Endoscopy;  Laterality: N/A;  8:30 am  . Peg placement  04/24/2011    Procedure: PERCUTANEOUS ENDOSCOPIC GASTROSTOMY (PEG) PLACEMENT;  Surgeon: Rogene Houston, MD;  [facility];  Service: Endoscopy;  Laterality: N/A;  . Esophagogastroduodenoscopy (egd) with esophageal dilation  09/02/2012    Procedure: ESOPHAGOGASTRODUODENOSCOPY (EGD) WITH ESOPHAGEAL DILATION;  Surgeon: Rogene Houston, MD;  [facility];  Service: Endoscopy;  Laterality: N/A;  245  . Esophageal dilation    . Esophagogastroduodenoscopy (egd) with esophageal dilation N/A 12/24/2012    Procedure: ESOPHAGOGASTRODUODENOSCOPY (EGD) WITH ESOPHAGEAL DILATION;  Surgeon: Rogene Houston, MD;  [facility];  Service: Endoscopy;  Laterality: N/A;  850  . Colonoscopy with esophagogastroduodenoscopy (egd) N/A 04/14/2013    Procedure: COLONOSCOPY WITH ESOPHAGOGASTRODUODENOSCOPY (EGD);  Surgeon: Rogene Houston, MD;  [facility];  Service: Endoscopy;  Laterality: N/A;  145  . Balloon dilation N/A 04/14/2013    Procedure: BALLOON DILATION;  Surgeon: Rogene Houston, MD;  [facility];  Service: Endoscopy;  Laterality: N/A;  Venia Minks dilation N/A 04/14/2013    Procedure: Venia Minks DILATION;  Surgeon: Mechele Dawley  Laural Golden, MD;  [facility];  Service: Endoscopy;  Laterality: N/A;  . Savory dilation N/A 04/14/2013    Procedure: SAVORY DILATION;  Surgeon: Rogene Houston, MD;  [facility];  Service: Endoscopy;  Laterality: N/A;  . Esophagogastroduodenoscopy N/A 01/23/2014    Procedure: ESOPHAGOGASTRODUODENOSCOPY (EGD);  Surgeon: Rogene Houston, MD;  [facility];  Service: Endoscopy;  Laterality: N/A;  730  . Balloon dilation N/A 01/23/2014    Procedure: BALLOON DILATION;  Surgeon: Rogene Houston, MD;  [facility];  Service: Endoscopy;  Laterality: N/A;  Venia Minks dilation N/A 01/23/2014    Procedure: Venia Minks DILATION;  Surgeon: Rogene Houston, MD;  [facility];  Service: Endoscopy;  Laterality: N/A;  . Savory dilation N/A 01/23/2014    Procedure: SAVORY DILATION;  Surgeon: Rogene Houston, MD;  [facility];  Service: Endoscopy;  Laterality: N/A;  . Esophagogastroduodenoscopy N/A 10/25/2014    Procedure: ESOPHAGOGASTRODUODENOSCOPY (EGD);  Surgeon: Rogene Houston, MD;  [facility];  Service: Endoscopy;  Laterality: N/A;  855 - moved to 2/3 @ 2:00  . Maloney dilation N/A 10/25/2014    Procedure: Venia Minks DILATION;  Surgeon: Rogene Houston, MD;  [facility];  Service: Endoscopy;  Laterality: N/A;    There were no vitals filed for this visit.  Visit Diagnosis:  Pain in surgical scar  Poor posture      Subjective Assessment - 09/06/15 0857    Subjective Pt states that he can already tell a big difference.  No questions on the HEP   Currently in Pain? No/denies            [facility] PT Assessment - 09/05/15 0947    Assessment   Medical Diagnosis scar pain   Referring Provider Virgina Jock   Onset Date/Surgical Date 08/24/15   Prior Therapy none   Precautions   Precautions None   Restrictions   Weight Bearing Restrictions No   Balance Screen   Has the patient fallen in the past 6 months No   Has the patient had a decrease in activity level because of a fear of falling?  No   Is the patient reluctant to leave their home because of a fear of falling?  No   Prior Function   Level of Independence Independent   Cognition   Overall Cognitive Status Within Functional Limits for tasks assessed   Observation/Other Assessments   Observations Pt exhibits hypertrophic scaring along CABG insicion;  He exhibits two small surgical incisions in the upper abdomen that have reverse puckering. indicative of myofascial adhesions.     Focus on Therapeutic  Outcomes (FOTO)  1   Posture/Postural Control   Posture/Postural Control Postural limitations   Postural Limitations Rounded Shoulders;Forward head;Decreased lumbar lordosis;Increased thoracic kyphosis   ROM / Strength   AROM / PROM / Strength --  ROM [facility]                     [facility] Adult PT Treatment/Exercise - 09/06/15 0909    Exercises   Exercises Shoulder   Shoulder Exercises: Supine   Flexion AROM;Both;10 reps   ABduction AROM;Both;10 reps   Shoulder Exercises: Standing   Other Standing Exercises Keep back against a wall and extend until shoulders hit wall.  Hold x 1 minute x 10   Modalities   Modalities --  Laser:  mm/ligament pain/ chronic: 1:26 min 7.9 J/cm2 x6 rep  Manual Therapy   Manual Therapy Myofascial release   Myofascial Release to reduce restriction along anterior chest and upper abdominal area.                  PT Education - 09/06/15 0913    Education provided Yes   Education Details for new exercises    Person(s) Educated Patient   Methods Explanation;Tactile cues;Handout   Comprehension Verbalized understanding;Returned demonstration          PT Short Term Goals - 09/06/15 0917    PT SHORT TERM GOAL #1   Title I in HEP to assist in proper posture to decrease constriction of soft tissue to improve pain    Time 2   Period Weeks   Status On-going   PT SHORT TERM GOAL #2   Title PT to state pain is at the most a 7/10 to improve pt quality of life.    Time 2   Period Weeks   Status On-going           PT Long Term Goals - 09/06/15 0917    PT LONG TERM GOAL #1   Title PT I in advance HEP to address desensitization of scar and self scar mobilization to decrease pain to improve pt quality of life    Time 4   Period Weeks   Status On-going   PT LONG TERM GOAL #2   Title Pt pain to be at the most a 5/10 to allow pt to wear a t-shirt wthout discomfort    Time 4   Period Weeks   Status On-going               Plan  - 09/06/15 0914    Clinical Impression Statement Pt responded well to Laser and manual therapy.   Completing desensitization program at home.  Pt verbalizes understanding of poor posture in condition but continues to not self correct.  Therapist explained importance of this to pateint.  Therapist facilitated exercies for proper technique.  Significant fascial restrictions at scar areas.    PT Next Visit Plan Continue with laser and manual begin prone shouder extension and row  progressing to Y's and T's           G-Codes - 27-Sep-2015 1115    Functional Assessment Tool Used foto   Functional Limitation Other PT primary   Other PT Primary Current Status [id number]) At least 20 percent but less than 40 percent impaired, limited or restricted   Other PT Primary Goal Status [id number]) At least 20 percent but less than 40 percent impaired, limited or restricted      Problem List Patient Active Problem List   Diagnosis Date Noted  . Constipation 03/23/2014  . Dysphagia 03/23/2014  . Neuropathy due to chemotherapeutic drug ([place]) 03/23/2014  . Xerostomia 06/10/2013  . Thrombocytopenia ([place]) 06/10/2013  . S/P radiation therapy   . 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 Orthostatic hypotension 05/11/2012  . Cancer of base of tongue ([place]) 11/17/2011  . Depression 10/01/2011  . Renal artery stenosis, native, bilateral ([place]) 09/03/2011  . 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 Ischemic heart disease 02/20/2011  . Hypercholesterolemia 02/20/2011  . Aortic valve stenosis, mild 02/20/2011    Rayetta Humphrey, PT CLT [phone] 09/06/2015, 9:19 AM  [place] [address] [place], [place], [address] Phone: [phone]   Fax:  [phone]  Name: George Bradley MRN: [id number] Date of Birth: [birth year]

## 2015-09-10 DIAGNOSIS — H25013 Cortical age-related cataract, bilateral: Secondary | ICD-10-CM | POA: Diagnosis not present

## 2015-09-10 DIAGNOSIS — H02403 Unspecified ptosis of bilateral eyelids: Secondary | ICD-10-CM | POA: Diagnosis not present

## 2015-09-10 DIAGNOSIS — H2513 Age-related nuclear cataract, bilateral: Secondary | ICD-10-CM | POA: Diagnosis not present

## 2015-09-10 DIAGNOSIS — H40013 Open angle with borderline findings, low risk, bilateral: Secondary | ICD-10-CM | POA: Diagnosis not present

## 2015-09-13 ENCOUNTER — Ambulatory Visit (HOSPITAL_COMMUNITY): Payer: Medicare Other

## 2015-09-13 DIAGNOSIS — L905 Scar conditions and fibrosis of skin: Secondary | ICD-10-CM | POA: Diagnosis not present

## 2015-09-13 DIAGNOSIS — R293 Abnormal posture: Secondary | ICD-10-CM

## 2015-09-13 DIAGNOSIS — R52 Pain, unspecified: Principal | ICD-10-CM

## 2015-09-13 NOTE — Therapy (Signed)
[place], [place], [address] Phone: [phone]   Fax:  [phone]  Physical Therapy Treatment  Patient Details  Name: George Bradley MRN: [id number] Date of Birth: 08/11/1941 Referring Provider: Virgina Jock  Encounter Date: 09/13/2015      PT End of Session - 09/13/15 1357    Visit Number 3   Number of Visits 12   Date for PT Re-Evaluation 10/05/15   Authorization Type medicare   Authorization - Visit Number 3   Authorization - Number of Visits 10   PT Start Time 1302   PT Stop Time 1402   PT Time Calculation (min) 60 min   Activity Tolerance Patient tolerated treatment well   Behavior During Therapy [facility] for tasks assessed/performed      Past Medical History  Diagnosis Date  . Ischemic heart disease   . Aortic stenosis, mild   . Hyperlipidemia   . Insomnia   . Fatigue   . Dizziness   . Hypotension     orthostatic  . History of echocardiogram 02/09/2009    EF -  55-60%  . History of cardiovascular stress test 03/14/2009    EF - 57%   /  mild anterior hyperkinesia with discrete anterior scar consistent with previous infarction but unchanged from previous studies.  Overall, stable findings -- [name], MD  . Weakness   . ASCVD (arteriosclerotic cardiovascular disease)   . Arrhythmia     known history of  . Weight loss, abnormal 2012  . 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 Protein-calorie malnutrition, moderate 2012    on PEG tube feeding  . Cellulitis of artificial external opening, post-operative 2012    of PEG tube site  . Mesenteric thrombosis   . 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 Coronary artery disease   . Thrombocytopenia, unspecified 06/10/2013    Past Surgical History  Procedure Laterality Date  . Coronary artery bypass graft  2000    x 4  . Cardiac catheterization  2007    EF - 55%  . Ablation of dysrhythmic  focus    . Esophagogastroduodenoscopy  04/24/2011    Procedure: ESOPHAGOGASTRODUODENOSCOPY (EGD);  Surgeon: Rogene Houston, MD;  [facility];  Service: Endoscopy;  Laterality: N/A;  8:30 am  . Peg placement  04/24/2011    Procedure: PERCUTANEOUS ENDOSCOPIC GASTROSTOMY (PEG) PLACEMENT;  Surgeon: Rogene Houston, MD;  [facility];  Service: Endoscopy;  Laterality: N/A;  . Esophagogastroduodenoscopy (egd) with esophageal dilation  09/02/2012    Procedure: ESOPHAGOGASTRODUODENOSCOPY (EGD) WITH ESOPHAGEAL DILATION;  Surgeon: Rogene Houston, MD;  [facility];  Service: Endoscopy;  Laterality: N/A;  245  . Esophageal dilation    . Esophagogastroduodenoscopy (egd) with esophageal dilation N/A 12/24/2012    Procedure: ESOPHAGOGASTRODUODENOSCOPY (EGD) WITH ESOPHAGEAL DILATION;  Surgeon: Rogene Houston, MD;  [facility];  Service: Endoscopy;  Laterality: N/A;  850  . Colonoscopy with esophagogastroduodenoscopy (egd) N/A 04/14/2013    Procedure: COLONOSCOPY WITH ESOPHAGOGASTRODUODENOSCOPY (EGD);  Surgeon: Rogene Houston, MD;  [facility];  Service: Endoscopy;  Laterality: N/A;  145  . Balloon dilation N/A 04/14/2013    Procedure: BALLOON DILATION;  Surgeon: Rogene Houston, MD;  [facility];  Service: Endoscopy;  Laterality: N/A;  Venia Minks dilation N/A 04/14/2013    Procedure: Venia Minks DILATION;  Surgeon: Mechele Dawley  Laural Golden, MD;  [facility];  Service: Endoscopy;  Laterality: N/A;  . Savory dilation N/A 04/14/2013    Procedure: SAVORY DILATION;  Surgeon: Rogene Houston, MD;  [facility];  Service: Endoscopy;  Laterality: N/A;  . Esophagogastroduodenoscopy N/A 01/23/2014    Procedure: ESOPHAGOGASTRODUODENOSCOPY (EGD);  Surgeon: Rogene Houston, MD;  [facility];  Service: Endoscopy;  Laterality: N/A;  730  . Balloon dilation N/A 01/23/2014    Procedure: BALLOON DILATION;  Surgeon: Rogene Houston, MD;  [facility];  Service: Endoscopy;  Laterality: N/A;  Venia Minks dilation N/A 01/23/2014    Procedure: Venia Minks DILATION;  Surgeon: Rogene Houston, MD;  [facility];  Service: Endoscopy;  Laterality: N/A;  . Savory dilation N/A 01/23/2014    Procedure: SAVORY DILATION;  Surgeon: Rogene Houston, MD;  [facility];  Service: Endoscopy;  Laterality: N/A;  . Esophagogastroduodenoscopy N/A 10/25/2014    Procedure: ESOPHAGOGASTRODUODENOSCOPY (EGD);  Surgeon: Rogene Houston, MD;  [facility];  Service: Endoscopy;  Laterality: N/A;  855 - moved to 2/3 @ 2:00  . Maloney dilation N/A 10/25/2014    Procedure: Venia Minks DILATION;  Surgeon: Rogene Houston, MD;  [facility];  Service: Endoscopy;  Laterality: N/A;    There were no vitals filed for this visit.  Visit Diagnosis:  Pain in surgical scar  Poor posture      Subjective Assessment - 09/13/15 1305    Subjective Pt stated he is feeling good today, reports compliance with HEP multiple times a day.   Pertinent History CABG, throat cancer.    Currently in Pain? No/denies             [facility] Adult PT Treatment/Exercise - 09/13/15 0001    Neck Exercises: Prone   Shoulder Extension 10 reps   Rows Limitations 10   Shoulder Exercises: Supine   Flexion AROM;Both;10 reps   ABduction AROM;Both;10 reps   Shoulder Exercises: Standing   Other Standing Exercises Keep back against a wall and extend until shoulders hit wall x 10.  Hold x 2 minute x 10   Modalities   Modalities Iontophoresis   Iontophoresis   Type of Iontophoresis Saline   Location chest   Dose 2cc   Time 12 min   Manual Therapy   Manual Therapy Myofascial release   Manual therapy comments supine position with knees flexed   Myofascial Release to reduce restriction along anterior chest and upper abdominal area.               PT Short Term Goals - 09/06/15 [id number]    PT SHORT TERM GOAL #1   Title I in HEP to assist in proper posture  to decrease constriction of soft tissue to improve pain    Time 2   Period Weeks   Status On-going   PT SHORT TERM GOAL #2   Title PT to state pain is at the most a 7/10 to improve pt quality of life.    Time 2   Period Weeks   Status On-going           PT Long Term Goals - 09/06/15 [id number]    PT LONG TERM GOAL #1   Title PT I in advance HEP to address desensitization of scar and self scar mobilization to decrease pain to improve pt quality of life    Time 4   Period Weeks   Status On-going  PT LONG TERM GOAL #2   Title Pt pain to be at the most a 5/10 to allow pt to wear a t-shirt wthout discomfort    Time 4   Period Weeks   Status On-going               Plan - 09/13/15 1714    Clinical Impression Statement Pt responding well with previous treatment with reports of increased tolerance now with towel desensitization program and ability to wear tshirt with increased tolerance.  Progressed postural strengthening with prone exercises with minimal cueing required for form.  Pt able to verbalize importance of proper posture but continues to require cueing to reduce forward head and forward rolled shoulder.  Therapist explained importance of this to pt.  Continued manual to reduce significant fasical restrictions to scar areas.  Received referral for iontophoresis with saline medication, began today with negative polarity charge.   PT Next Visit Plan F/U with ionto, continue with ionto/laser and manual begin progressing to Y's and T's         Problem List Patient Active Problem List   Diagnosis Date Noted  . Constipation 03/23/2014  . Dysphagia 03/23/2014  . 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 Xerostomia 06/10/2013  . Thrombocytopenia ([place]) 06/10/2013  . S/P radiation therapy   . 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 Orthostatic hypotension 05/11/2012  . Cancer of base of tongue ([place]) 11/17/2011  . Depression 10/01/2011  . Renal artery stenosis, native,  bilateral ([place]) 09/03/2011  . 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 Ischemic heart disease 02/20/2011  . Hypercholesterolemia 02/20/2011  . Aortic valve stenosis, mild 02/20/2011   Ihor Austin, LPTA; [place]  Aldona Lento 09/13/2015, 5:33 PM  [facility] [address] [place], [place], [address] Phone: [phone]   Fax:  [phone]  Name: George Bradley MRN: [id number] Date of Birth: Feb 10, 1941

## 2015-09-20 ENCOUNTER — Ambulatory Visit (HOSPITAL_COMMUNITY): Payer: Medicare Other | Admitting: Physical Therapy

## 2015-09-20 DIAGNOSIS — R293 Abnormal posture: Secondary | ICD-10-CM | POA: Diagnosis not present

## 2015-09-20 DIAGNOSIS — R52 Pain, unspecified: Principal | ICD-10-CM

## 2015-09-20 DIAGNOSIS — L905 Scar conditions and fibrosis of skin: Secondary | ICD-10-CM | POA: Diagnosis not present

## 2015-09-20 NOTE — Therapy (Signed)
[place], [place], [address] Phone: [phone]   Fax:  [phone]  Physical Therapy Treatment  Patient Details  Name: George Bradley MRN: [id number] Date of Birth: 03/07/1941 Referring Provider: Virgina Jock  Encounter Date: 09/20/2015      PT End of Session - 09/20/15 1239    Visit Number 4   Number of Visits 12   Date for PT Re-Evaluation 10/05/15   Authorization Type medicare   Authorization - Visit Number 4   Authorization - Number of Visits 10   PT Start Time [id number]   PT Stop Time [id number]   PT Time Calculation (min) 50 min   Activity Tolerance Patient tolerated treatment well      Past Medical History  Diagnosis Date  . Ischemic heart disease   . Aortic stenosis, mild   . Hyperlipidemia   . Insomnia   . Fatigue   . Dizziness   . Hypotension     orthostatic  . History of echocardiogram 02/09/2009    EF -  55-60%  . History of cardiovascular stress test 03/14/2009    EF - 57%   /  mild anterior hyperkinesia with discrete anterior scar consistent with previous infarction but unchanged from previous studies.  Overall, stable findings -- [name], MD  . Weakness   . ASCVD (arteriosclerotic cardiovascular disease)   . Arrhythmia     known history of  . Weight loss, abnormal 2012  . 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 Protein-calorie malnutrition, moderate 2012    on PEG tube feeding  . Cellulitis of artificial external opening, post-operative 2012    of PEG tube site  . Mesenteric thrombosis   . 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 Coronary artery disease   . Thrombocytopenia, unspecified 06/10/2013    Past Surgical History  Procedure Laterality Date  . Coronary artery bypass graft  2000    x 4  . Cardiac catheterization  2007    EF - 55%  . Ablation of dysrhythmic focus    . Esophagogastroduodenoscopy  04/24/2011     Procedure: ESOPHAGOGASTRODUODENOSCOPY (EGD);  Surgeon: Rogene Houston, MD;  [facility];  Service: Endoscopy;  Laterality: N/A;  8:30 am  . Peg placement  04/24/2011    Procedure: PERCUTANEOUS ENDOSCOPIC GASTROSTOMY (PEG) PLACEMENT;  Surgeon: Rogene Houston, MD;  [facility];  Service: Endoscopy;  Laterality: N/A;  . Esophagogastroduodenoscopy (egd) with esophageal dilation  09/02/2012    Procedure: ESOPHAGOGASTRODUODENOSCOPY (EGD) WITH ESOPHAGEAL DILATION;  Surgeon: Rogene Houston, MD;  [facility];  Service: Endoscopy;  Laterality: N/A;  245  . Esophageal dilation    . Esophagogastroduodenoscopy (egd) with esophageal dilation N/A 12/24/2012    Procedure: ESOPHAGOGASTRODUODENOSCOPY (EGD) WITH ESOPHAGEAL DILATION;  Surgeon: Rogene Houston, MD;  [facility];  Service: Endoscopy;  Laterality: N/A;  850  . Colonoscopy with esophagogastroduodenoscopy (egd) N/A 04/14/2013    Procedure: COLONOSCOPY WITH ESOPHAGOGASTRODUODENOSCOPY (EGD);  Surgeon: Rogene Houston, MD;  [facility];  Service: Endoscopy;  Laterality: N/A;  145  . Balloon dilation N/A 04/14/2013    Procedure: BALLOON DILATION;  Surgeon: Rogene Houston, MD;  [facility];  Service: Endoscopy;  Laterality: N/A;  Venia Minks dilation N/A 04/14/2013    Procedure: Venia Minks DILATION;  Surgeon: Rogene Houston, MD;  [facility];  Service:  Endoscopy;  Laterality: N/A;  . Savory dilation N/A 04/14/2013    Procedure: SAVORY DILATION;  Surgeon: Rogene Houston, MD;  [facility];  Service: Endoscopy;  Laterality: N/A;  . Esophagogastroduodenoscopy N/A 01/23/2014    Procedure: ESOPHAGOGASTRODUODENOSCOPY (EGD);  Surgeon: Rogene Houston, MD;  [facility];  Service: Endoscopy;  Laterality: N/A;  730  . Balloon dilation N/A 01/23/2014    Procedure: BALLOON DILATION;  Surgeon: Rogene Houston, MD;  [facility];  Service: Endoscopy;  Laterality: N/A;   Venia Minks dilation N/A 01/23/2014    Procedure: Venia Minks DILATION;  Surgeon: Rogene Houston, MD;  [facility];  Service: Endoscopy;  Laterality: N/A;  . Savory dilation N/A 01/23/2014    Procedure: SAVORY DILATION;  Surgeon: Rogene Houston, MD;  [facility];  Service: Endoscopy;  Laterality: N/A;  . Esophagogastroduodenoscopy N/A 10/25/2014    Procedure: ESOPHAGOGASTRODUODENOSCOPY (EGD);  Surgeon: Rogene Houston, MD;  [facility];  Service: Endoscopy;  Laterality: N/A;  855 - moved to 2/3 @ 2:00  . Maloney dilation N/A 10/25/2014    Procedure: Venia Minks DILATION;  Surgeon: Rogene Houston, MD;  [facility];  Service: Endoscopy;  Laterality: N/A;    There were no vitals filed for this visit.  Visit Diagnosis:  Pain in surgical scar  Poor posture      Subjective Assessment - 09/20/15 1232    Subjective Pt states that he is continuing to be painfree feels like the treatment is a miracle    Pertinent History CABG, throat cancer.    Currently in Pain? No/denies                         [facility] Adult PT Treatment/Exercise - 09/20/15 0001    Exercises   Exercises Neck;Shoulder   Neck Exercises: Prone   Axial Exentsion 5 reps   Shoulder Extension 10 reps   Other Prone Exercise w-back, Y's and T's    Modalities   Modalities Iontophoresis    Iontophoresis   Type of Iontophoresis Saline   Location hypertrophic scaing    Dose 2cc   Time 14   Manual Therapy   Manual Therapy Myofascial release   Manual therapy comments supine position with knees flexed   Myofascial Release to reduce restriction along anterior chest and upper abdominal area.      Laser treatment done to 2 abdominal scars and 4 treatment over chest scar.  Laser done with safety glasses,  MM/ ligament pain chronic at 1:24 seconds 59.3J/cm2 each             PT Education - 09/20/15 1238    Education provided Yes   Education Details new prone exercises    Person(s) Educated Patient   Methods Explanation   Comprehension Verbalized understanding          PT Short Term Goals - 09/06/15 0917    PT SHORT TERM GOAL #1   Title I in HEP to assist in proper posture to decrease constriction of soft tissue to improve pain    Time 2   Period Weeks   Status On-going   PT SHORT TERM GOAL #2   Title PT to state pain is at the most a 7/10 to improve pt quality of life.    Time 2   Period Weeks   Status On-going           PT  Long Term Goals - 09/06/15 0917    PT LONG TERM GOAL #1   Title PT I in advance HEP to address desensitization of scar and self scar mobilization to decrease pain to improve pt quality of life    Time 4   Period Weeks   Status On-going   PT LONG TERM GOAL #2   Title Pt pain to be at the most a 5/10 to allow pt to wear a t-shirt wthout discomfort    Time 4   Period Weeks   Status On-going               Plan - 09/20/15 1241    Clinical Impression Statement Pt states he continues to improve and is happy with his progress. Instructed pt in I's , Y's and T's .  I's, (double arm raise), needed to be modified due to weakness.  Manual completed to reduce myofascial adhesions   PT Frequency 3x / week   PT Duration 4 weeks   PT Treatment/Interventions ADLs/Self Care Home Management;Iontophoresis 4mg /ml Dexamethasone;Manual techniques;Therapeutic exercise;Scar mobilization;Patient/family education   PT Next Visit Plan continue with laser and ionto.  This is 2nd ionto treatment.  Anticipate discharge after 6th visit.    PT Home Exercise Plan given        Problem List Patient Active Problem List   Diagnosis Date Noted  . Constipation 03/23/2014  . Dysphagia 03/23/2014  . Neuropathy due to chemotherapeutic drug ([place]) 03/23/2014  . Xerostomia 06/10/2013  . Thrombocytopenia ([place]) 06/10/2013  . S/P radiation therapy   . 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 Orthostatic hypotension 05/11/2012  . Cancer of base of tongue  ([place]) 11/17/2011  . Depression 10/01/2011  . Renal artery stenosis, native, bilateral ([place]) 09/03/2011  . 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 Ischemic heart disease 02/20/2011  . Hypercholesterolemia 02/20/2011  . Aortic valve stenosis, mild 02/20/2011   Rayetta Humphrey, PT CLT [phone] 09/20/2015, 12:46 PM  [facility] [address] [place], [place], [address] Phone: [phone]   Fax:  [phone]  Name: George Bradley MRN: [id number] Date of Birth: May 16, 1941

## 2015-09-20 NOTE — Patient Instructions (Signed)
Axial Extension    Lie on stomach with forehead resting on floor and arms at sides. Tuck chin in and raise head from floor without bending it up or down. Repeat _10___ times per set. Do _2___ sets per session. Do _3___ sessions per day.  http://orth.exer.us/970   Copyright  VHI. All rights reserved.  Scapular Retraction (Prone)    Lie with arms above head. Pinch shoulder blades together. Repeat _10___ times per set. Do __1__ sets per session. Do ___2_ sessions per day.  http://orth.exer.us/942   Copyright  VHI. All rights reserved.  Scapular Retraction (Prone)    Lie with arms at sides. Pinch shoulder blades together and raise arms a few inches from floor. Repeat _10___ times per set. Do 1____ sets per session. Do __2__ sessions per day.  http://orth.exer.us/954   Copyright  VHI. All rights reserved.  Scapular Retraction: Abduction (Prone)    Lie with upper arms straight out from sides, elbows bent to 90. Pinch shoulder blades together and raise arms a few inches from floor. Repeat 10____ times per set. Do ___1_ sets per session. Do __2__ sessions per day.  http://orth.exer.us/956   Copyright  VHI. All rights reserved.  Scapular Retraction: Abduction / Extension (Prone)    Lie with arms out from sides 90. Pinch shoulder blades together and raise arms a few inches from floor. Repeat __10__ times per set. Do __1__ sets per session. Do __1__ sessions per day.  http://orth.exer.us/958   Copyright  VHI. All rights reserved.

## 2015-09-25 ENCOUNTER — Other Ambulatory Visit: Payer: Self-pay

## 2015-09-25 MED ORDER — ROSUVASTATIN CALCIUM 20 MG PO TABS: 20.0000 mg | ORAL_TABLET | Freq: Every day | ORAL | Status: DC

## 2015-09-25 NOTE — Telephone Encounter (Signed)
Peter M Martinique, MD at 05/17/2015 3:41 PM  rosuvastatin (CRESTOR) 20 MG tabletTake 1 tablet (20 mg total) by mouth at bedtime Patient Instructions     Continue your current therapy  I will see you in 6 months

## 2015-09-26 ENCOUNTER — Ambulatory Visit (HOSPITAL_COMMUNITY): Payer: Medicare Other | Attending: Internal Medicine | Admitting: Physical Therapy

## 2015-09-26 DIAGNOSIS — R52 Pain, unspecified: Secondary | ICD-10-CM

## 2015-09-26 DIAGNOSIS — R293 Abnormal posture: Secondary | ICD-10-CM | POA: Diagnosis not present

## 2015-09-26 DIAGNOSIS — L905 Scar conditions and fibrosis of skin: Secondary | ICD-10-CM

## 2015-09-26 NOTE — Therapy (Signed)
[place], [place], [address] Phone: [phone]   Fax:  [phone]  Physical Therapy Treatment  Patient Details  Name: George Bradley MRN: [id number] Date of Birth: 1941-04-16 Referring Provider: Virgina Jock  Encounter Date: 09/26/2015      PT End of Session - 09/26/15 0839    Visit Number 5   Number of Visits 12   Date for PT Re-Evaluation 10/05/15   Authorization Type medicare   Authorization - Visit Number 5   Authorization - Number of Visits 10   PT Start Time 0800   PT Stop Time [id number]   PT Time Calculation (min) 55 min   Activity Tolerance Patient tolerated treatment well   Behavior During Therapy [facility] for tasks assessed/performed      Past Medical History  Diagnosis Date  . Ischemic heart disease   . Aortic stenosis, mild   . Hyperlipidemia   . Insomnia   . Fatigue   . Dizziness   . Hypotension     orthostatic  . History of echocardiogram 02/09/2009    EF -  55-60%  . History of cardiovascular stress test 03/14/2009    EF - 57%   /  mild anterior hyperkinesia with discrete anterior scar consistent with previous infarction but unchanged from previous studies.  Overall, stable findings -- [name], MD  . Weakness   . ASCVD (arteriosclerotic cardiovascular disease)   . Arrhythmia     known history of  . Weight loss, abnormal 2012  . 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 Protein-calorie malnutrition, moderate 2012    on PEG tube feeding  . Cellulitis of artificial external opening, post-operative 2012    of PEG tube site  . Mesenteric thrombosis   . 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 Coronary artery disease   . Thrombocytopenia, unspecified 06/10/2013    Past Surgical History  Procedure Laterality Date  . Coronary artery bypass graft  2000    x 4  . Cardiac catheterization  2007    EF - 55%  . Ablation of dysrhythmic  focus    . Esophagogastroduodenoscopy  04/24/2011    Procedure: ESOPHAGOGASTRODUODENOSCOPY (EGD);  Surgeon: Rogene Houston, MD;  [facility];  Service: Endoscopy;  Laterality: N/A;  8:30 am  . Peg placement  04/24/2011    Procedure: PERCUTANEOUS ENDOSCOPIC GASTROSTOMY (PEG) PLACEMENT;  Surgeon: Rogene Houston, MD;  [facility];  Service: Endoscopy;  Laterality: N/A;  . Esophagogastroduodenoscopy (egd) with esophageal dilation  09/02/2012    Procedure: ESOPHAGOGASTRODUODENOSCOPY (EGD) WITH ESOPHAGEAL DILATION;  Surgeon: Rogene Houston, MD;  [facility];  Service: Endoscopy;  Laterality: N/A;  245  . Esophageal dilation    . Esophagogastroduodenoscopy (egd) with esophageal dilation N/A 12/24/2012    Procedure: ESOPHAGOGASTRODUODENOSCOPY (EGD) WITH ESOPHAGEAL DILATION;  Surgeon: Rogene Houston, MD;  [facility];  Service: Endoscopy;  Laterality: N/A;  850  . Colonoscopy with esophagogastroduodenoscopy (egd) N/A 04/14/2013    Procedure: COLONOSCOPY WITH ESOPHAGOGASTRODUODENOSCOPY (EGD);  Surgeon: Rogene Houston, MD;  [facility];  Service: Endoscopy;  Laterality: N/A;  145  . Balloon dilation N/A 04/14/2013    Procedure: BALLOON DILATION;  Surgeon: Rogene Houston, MD;  [facility];  Service: Endoscopy;  Laterality: N/A;  Venia Minks dilation N/A 04/14/2013    Procedure: Venia Minks DILATION;  Surgeon: Mechele Dawley  Laural Golden, MD;  [facility];  Service: Endoscopy;  Laterality: N/A;  . Savory dilation N/A 04/14/2013    Procedure: SAVORY DILATION;  Surgeon: Rogene Houston, MD;  [facility];  Service: Endoscopy;  Laterality: N/A;  . Esophagogastroduodenoscopy N/A 01/23/2014    Procedure: ESOPHAGOGASTRODUODENOSCOPY (EGD);  Surgeon: Rogene Houston, MD;  [facility];  Service: Endoscopy;  Laterality: N/A;  730  . Balloon dilation N/A 01/23/2014    Procedure: BALLOON DILATION;  Surgeon: Rogene Houston, MD;  [facility];  Service: Endoscopy;  Laterality: N/A;  Venia Minks dilation N/A 01/23/2014    Procedure: Venia Minks DILATION;  Surgeon: Rogene Houston, MD;  [facility];  Service: Endoscopy;  Laterality: N/A;  . Savory dilation N/A 01/23/2014    Procedure: SAVORY DILATION;  Surgeon: Rogene Houston, MD;  [facility];  Service: Endoscopy;  Laterality: N/A;  . Esophagogastroduodenoscopy N/A 10/25/2014    Procedure: ESOPHAGOGASTRODUODENOSCOPY (EGD);  Surgeon: Rogene Houston, MD;  [facility];  Service: Endoscopy;  Laterality: N/A;  855 - moved to 2/3 @ 2:00  . Maloney dilation N/A 10/25/2014    Procedure: Venia Minks DILATION;  Surgeon: Rogene Houston, MD;  [facility];  Service: Endoscopy;  Laterality: N/A;    There were no vitals filed for this visit.  Visit Diagnosis:  Pain in surgical scar  Poor posture      Subjective Assessment - 09/26/15 0824    Subjective Pt states that he had some sharp pain yesterday unsue why but is painfree today    Currently in Pain? No/denies              [facility] Adult PT Treatment/Exercise - 09/26/15 0001    Neck Exercises: Supine   Neck Retraction 10 reps   Other Supine Exercise decompression t-band exercises green x 5 each    Other Supine Exercise scapular retraction x 10    Neck Exercises: Prone   Axial Exentsion 5 reps   Shoulder Extension 10 reps   Other Prone Exercise w-back, Y's and T's    Modalities   Modalities Iontophoresis  Laser 6 treatment; 60J/cm2 1:24 each to scar area.    Iontophoresis   Type of Iontophoresis Saline   Location hypertrophic scaing    Dose 2cc   Time 14   Manual Therapy   Manual Therapy Myofascial release   Manual therapy comments supine position with knees flexed   Myofascial Release to reduce restriction along anterior chest and upper abdominal area.                  PT Education - 09/26/15 0839    Education provided Yes   Education Details t-band postural  supine execises    Person(s) Educated Patient   Methods Explanation;Handout   Comprehension Verbalized understanding;Returned demonstration          PT Short Term Goals - 09/06/15 0917    PT SHORT TERM GOAL #1   Title I in HEP to assist in proper posture to decrease constriction of soft tissue to improve pain    Time 2   Period Weeks   Status On-going   PT SHORT TERM GOAL #2   Title PT to state pain is at the most a 7/10 to improve pt quality of life.    Time 2   Period Weeks   Status On-going           PT  Long Term Goals - 09/06/15 0917    PT LONG TERM GOAL #1   Title PT I in advance HEP to address desensitization of scar and self scar mobilization to decrease pain to improve pt quality of life    Time 4   Period Weeks   Status On-going   PT LONG TERM GOAL #2   Title Pt pain to be at the most a 5/10 to allow pt to wear a t-shirt wthout discomfort    Time 4   Period Weeks   Status On-going               Plan - 09/26/15 0846    Clinical Impression Statement Pt is able to complete I's with significant effort but able to complete.  Pt demonstrates improved posture awareness.  Added supine postural exercises with t-band .   Pt will benefit from skilled therapeutic intervention in order to improve on the following deficits Decreased scar mobility;Increased fascial restricitons;Postural dysfunction;Pain   PT Next Visit Plan Stop ionto after 6th treatment this is the 3rd. Begin standing t-band postural exercises.        Problem List Patient Active Problem List   Diagnosis Date Noted  . Constipation 03/23/2014  . Dysphagia 03/23/2014  . 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 Xerostomia 06/10/2013  . Thrombocytopenia ([place]) 06/10/2013  . S/P radiation therapy   . 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 Orthostatic hypotension 05/11/2012  . Cancer of base of tongue ([place]) 11/17/2011  . Depression 10/01/2011  . Renal artery stenosis, native, bilateral  ([place]) 09/03/2011  . 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 Ischemic heart disease 02/20/2011  . Hypercholesterolemia 02/20/2011  . Aortic valve stenosis, mild 02/20/2011   Rayetta Humphrey, PT CLT [phone] 09/26/2015, 8:50 AM  [place] [address] [place], [place], [address] Phone: [phone]   Fax:  [phone]  Name: George Bradley MRN: [id number] Date of Birth: [birth year]

## 2015-09-28 ENCOUNTER — Ambulatory Visit (HOSPITAL_COMMUNITY): Payer: Medicare Other | Admitting: Physical Therapy

## 2015-09-28 DIAGNOSIS — R293 Abnormal posture: Secondary | ICD-10-CM | POA: Diagnosis not present

## 2015-09-28 DIAGNOSIS — L905 Scar conditions and fibrosis of skin: Secondary | ICD-10-CM | POA: Diagnosis not present

## 2015-09-28 DIAGNOSIS — R52 Pain, unspecified: Principal | ICD-10-CM

## 2015-09-28 NOTE — Therapy (Signed)
[place], [place], [address] Phone: [phone]   Fax:  [phone]  Physical Therapy Treatment  Patient Details  Name: George Bradley MRN: [id number] Date of Birth: April 05, 1941 Referring Provider: Virgina Jock  Encounter Date: 09/28/2015      PT End of Session - 09/28/15 0843    Visit Number 6   Number of Visits 12   Date for PT Re-Evaluation 10/05/15   Authorization Type medicare   Authorization - Visit Number 6   Authorization - Number of Visits 10   PT Start Time 0802   PT Stop Time [id number]   PT Time Calculation (min) 50 min      Past Medical History  Diagnosis Date  . Ischemic heart disease   . Aortic stenosis, mild   . Hyperlipidemia   . Insomnia   . Fatigue   . Dizziness   . Hypotension     orthostatic  . History of echocardiogram 02/09/2009    EF -  55-60%  . History of cardiovascular stress test 03/14/2009    EF - 57%   /  mild anterior hyperkinesia with discrete anterior scar consistent with previous infarction but unchanged from previous studies.  Overall, stable findings -- [name], MD  . Weakness   . ASCVD (arteriosclerotic cardiovascular disease)   . Arrhythmia     known history of  . Weight loss, abnormal 2012  . 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 Protein-calorie malnutrition, moderate 2012    on PEG tube feeding  . Cellulitis of artificial external opening, post-operative 2012    of PEG tube site  . Mesenteric thrombosis   . 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 Coronary artery disease   . Thrombocytopenia, unspecified 06/10/2013    Past Surgical History  Procedure Laterality Date  . Coronary artery bypass graft  2000    x 4  . Cardiac catheterization  2007    EF - 55%  . Ablation of dysrhythmic focus    . Esophagogastroduodenoscopy  04/24/2011    Procedure: ESOPHAGOGASTRODUODENOSCOPY (EGD);  Surgeon: Rogene Houston, MD;  [facility];  Service: Endoscopy;  Laterality: N/A;  8:30 am  . Peg placement  04/24/2011    Procedure: PERCUTANEOUS ENDOSCOPIC GASTROSTOMY (PEG) PLACEMENT;  Surgeon: Rogene Houston, MD;  [facility];  Service: Endoscopy;  Laterality: N/A;  . Esophagogastroduodenoscopy (egd) with esophageal dilation  09/02/2012    Procedure: ESOPHAGOGASTRODUODENOSCOPY (EGD) WITH ESOPHAGEAL DILATION;  Surgeon: Rogene Houston, MD;  [facility];  Service: Endoscopy;  Laterality: N/A;  245  . Esophageal dilation    . Esophagogastroduodenoscopy (egd) with esophageal dilation N/A 12/24/2012    Procedure: ESOPHAGOGASTRODUODENOSCOPY (EGD) WITH ESOPHAGEAL DILATION;  Surgeon: Rogene Houston, MD;  [facility];  Service: Endoscopy;  Laterality: N/A;  850  . Colonoscopy with esophagogastroduodenoscopy (egd) N/A 04/14/2013    Procedure: COLONOSCOPY WITH ESOPHAGOGASTRODUODENOSCOPY (EGD);  Surgeon: Rogene Houston, MD;  [facility];  Service: Endoscopy;  Laterality: N/A;  145  . Balloon dilation N/A 04/14/2013    Procedure: BALLOON DILATION;  Surgeon: Rogene Houston, MD;  [facility];  Service: Endoscopy;  Laterality: N/A;  Venia Minks dilation N/A 04/14/2013    Procedure: Venia Minks DILATION;  Surgeon: Rogene Houston, MD;  [facility];  Service: Endoscopy;  Laterality: N/A;  . Savory dilation  N/A 04/14/2013    Procedure: SAVORY DILATION;  Surgeon: Rogene Houston, MD;  [facility];  Service: Endoscopy;  Laterality: N/A;  . Esophagogastroduodenoscopy N/A 01/23/2014    Procedure: ESOPHAGOGASTRODUODENOSCOPY (EGD);  Surgeon: Rogene Houston, MD;  [facility];  Service: Endoscopy;  Laterality: N/A;  730  . Balloon dilation N/A 01/23/2014    Procedure: BALLOON DILATION;  Surgeon: Rogene Houston, MD;  [facility];  Service: Endoscopy;  Laterality: N/A;  Venia Minks dilation N/A 01/23/2014    Procedure: Venia Minks  DILATION;  Surgeon: Rogene Houston, MD;  [facility];  Service: Endoscopy;  Laterality: N/A;  . Savory dilation N/A 01/23/2014    Procedure: SAVORY DILATION;  Surgeon: Rogene Houston, MD;  [facility];  Service: Endoscopy;  Laterality: N/A;  . Esophagogastroduodenoscopy N/A 10/25/2014    Procedure: ESOPHAGOGASTRODUODENOSCOPY (EGD);  Surgeon: Rogene Houston, MD;  [facility];  Service: Endoscopy;  Laterality: N/A;  855 - moved to 2/3 @ 2:00  . Maloney dilation N/A 10/25/2014    Procedure: Venia Minks DILATION;  Surgeon: Rogene Houston, MD;  [facility];  Service: Endoscopy;  Laterality: N/A;    There were no vitals filed for this visit.  Visit Diagnosis:  Pain in surgical scar  Poor posture      Subjective Assessment - 09/28/15 0838    Subjective Pt states he has been doing his exercises and has no questions.    Currently in Pain? No/denies                         [facility] Adult PT Treatment/Exercise - 09/28/15 0001    Neck Exercises: Standing   Other Standing Exercises postural standing at wall keep back at wall B UE flexion x 10    Other Standing Exercises T-band scapular retraction, rows extension x 5    Modalities   Modalities Iontophoresis  Laser 6 treatment; 60J/cm2 1:24 each to scar area.    Iontophoresis   Type of Iontophoresis Saline   Location hypertrophic scaing    Dose 2cc   Time 14   Manual Therapy   Manual Therapy Myofascial release   Manual therapy comments supine position with knees flexed   Myofascial Release to reduce restriction along anterior chest and upper abdominal area.                  PT Education - 09/28/15 [phone]    Education provided Yes   Education Details t-band standing postural exercises    Person(s) Educated Patient   Methods Explanation   Comprehension Verbalized understanding          PT Short Term Goals - 09/06/15 0917    PT SHORT TERM GOAL #1   Title I in HEP to  assist in proper posture to decrease constriction of soft tissue to improve pain    Time 2   Period Weeks   Status On-going   PT SHORT TERM GOAL #2   Title PT to state pain is at the most a 7/10 to improve pt quality of life.    Time 2   Period Weeks   Status On-going           PT Long Term Goals - 09/06/15 [id number]    PT LONG TERM GOAL #1   Title PT I in advance HEP to address desensitization of scar and self scar mobilization to decrease pain  to improve pt quality of life    Time 4   Period Weeks   Status On-going   PT LONG TERM GOAL #2   Title Pt pain to be at the most a 5/10 to allow pt to wear a t-shirt wthout discomfort    Time 4   Period Weeks   Status On-going               Plan - 09/28/15 0844    Clinical Impression Statement Pt instructed in standing postural exercises with verbal facilitation for proper technique.  Pt continues to have decreased sensitivity on scar.  Noted "rash" area where iontophoresis had been placed.  Placed ionto on scar but suprerior to rash. This is the 4th treatment for iontoporesis    PT Next Visit Plan assess rash next treatment.         Problem List Patient Active Problem List   Diagnosis Date Noted  . Constipation 03/23/2014  . Dysphagia 03/23/2014  . Neuropathy due to chemotherapeutic drug ([place]) 03/23/2014  . Xerostomia 06/10/2013  . Thrombocytopenia ([place]) 06/10/2013  . S/P radiation therapy   . 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 Orthostatic hypotension 05/11/2012  . Cancer of base of tongue ([place]) 11/17/2011  . Depression 10/01/2011  . Renal artery stenosis, native, bilateral ([place]) 09/03/2011  . 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 Ischemic heart disease 02/20/2011  . Hypercholesterolemia 02/20/2011  . Aortic valve stenosis, mild 02/20/2011   Rayetta Humphrey, PT CLT [phone] 09/28/2015, 8:50 AM  [place] [address] [place], [place], [address] Phone: [phone]    Fax:  [phone]  Name: George Bradley MRN: [id number] Date of Birth: 05-25-1941

## 2015-10-01 ENCOUNTER — Ambulatory Visit (HOSPITAL_COMMUNITY): Payer: Medicare Other | Admitting: Physical Therapy

## 2015-10-01 DIAGNOSIS — R293 Abnormal posture: Secondary | ICD-10-CM | POA: Diagnosis not present

## 2015-10-01 DIAGNOSIS — L905 Scar conditions and fibrosis of skin: Secondary | ICD-10-CM

## 2015-10-01 DIAGNOSIS — R52 Pain, unspecified: Principal | ICD-10-CM

## 2015-10-01 NOTE — Therapy (Signed)
[place] [address] [place] [address] [place], [place], [address] Phone: [phone]   Fax:  [phone]  Physical Therapy Treatment  Patient Details  Name: George Bradley MRN: [id number] Date of Birth: 1941/06/28 Referring Provider: Virgina Jock  Encounter Date: 10/01/2015      PT End of Session - 10/01/15 0843    Visit Number 7   Number of Visits 8   Date for PT Re-Evaluation 10/05/15   Authorization Type medicare   Authorization - Visit Number 7   Authorization - Number of Visits 8   PT Start Time 0802   PT Stop Time 0851   PT Time Calculation (min) 49 min   Activity Tolerance Patient tolerated treatment well   Behavior During Therapy [facility] for tasks assessed/performed      Past Medical History  Diagnosis Date  . Ischemic heart disease   . Aortic stenosis, mild   . Hyperlipidemia   . Insomnia   . Fatigue   . Dizziness   . Hypotension     orthostatic  . History of echocardiogram 02/09/2009    EF -  55-60%  . History of cardiovascular stress test 03/14/2009    EF - 57%   /  mild anterior hyperkinesia with discrete anterior scar consistent with previous infarction but unchanged from previous studies.  Overall, stable findings -- [name], MD  . Weakness   . ASCVD (arteriosclerotic cardiovascular disease)   . Arrhythmia     known history of  . Weight loss, abnormal 2012  . 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 Protein-calorie malnutrition, moderate 2012    on PEG tube feeding  . Cellulitis of artificial external opening, post-operative 2012    of PEG tube site  . Mesenteric thrombosis   . 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 Coronary artery disease   . Thrombocytopenia, unspecified 06/10/2013    Past Surgical History  Procedure Laterality Date  . Coronary artery bypass graft  2000    x 4  . Cardiac catheterization  2007    EF - 55%  . Ablation of dysrhythmic focus     . Esophagogastroduodenoscopy  04/24/2011    Procedure: ESOPHAGOGASTRODUODENOSCOPY (EGD);  Surgeon: Rogene Houston, MD;  [facility];  Service: Endoscopy;  Laterality: N/A;  8:30 am  . Peg placement  04/24/2011    Procedure: PERCUTANEOUS ENDOSCOPIC GASTROSTOMY (PEG) PLACEMENT;  Surgeon: Rogene Houston, MD;  [facility];  Service: Endoscopy;  Laterality: N/A;  . Esophagogastroduodenoscopy (egd) with esophageal dilation  09/02/2012    Procedure: ESOPHAGOGASTRODUODENOSCOPY (EGD) WITH ESOPHAGEAL DILATION;  Surgeon: Rogene Houston, MD;  [facility];  Service: Endoscopy;  Laterality: N/A;  245  . Esophageal dilation    . Esophagogastroduodenoscopy (egd) with esophageal dilation N/A 12/24/2012    Procedure: ESOPHAGOGASTRODUODENOSCOPY (EGD) WITH ESOPHAGEAL DILATION;  Surgeon: Rogene Houston, MD;  [facility];  Service: Endoscopy;  Laterality: N/A;  850  . Colonoscopy with esophagogastroduodenoscopy (egd) N/A 04/14/2013    Procedure: COLONOSCOPY WITH ESOPHAGOGASTRODUODENOSCOPY (EGD);  Surgeon: Rogene Houston, MD;  [facility];  Service: Endoscopy;  Laterality: N/A;  145  . Balloon dilation N/A 04/14/2013    Procedure: BALLOON DILATION;  Surgeon: Rogene Houston, MD;  [facility];  Service: Endoscopy;  Laterality: N/A;  Venia Minks dilation N/A 04/14/2013    Procedure: Venia Minks DILATION;  Surgeon: Mechele Dawley  Laural Golden, MD;  [facility];  Service: Endoscopy;  Laterality: N/A;  . Savory dilation N/A 04/14/2013    Procedure: SAVORY DILATION;  Surgeon: Rogene Houston, MD;  [facility];  Service: Endoscopy;  Laterality: N/A;  . Esophagogastroduodenoscopy N/A 01/23/2014    Procedure: ESOPHAGOGASTRODUODENOSCOPY (EGD);  Surgeon: Rogene Houston, MD;  [facility];  Service: Endoscopy;  Laterality: N/A;  730  . Balloon dilation N/A 01/23/2014    Procedure: BALLOON DILATION;  Surgeon: Rogene Houston, MD;  [facility];  Service: Endoscopy;  Laterality: N/A;  Venia Minks dilation N/A 01/23/2014    Procedure: Venia Minks DILATION;  Surgeon: Rogene Houston, MD;  [facility];  Service: Endoscopy;  Laterality: N/A;  . Savory dilation N/A 01/23/2014    Procedure: SAVORY DILATION;  Surgeon: Rogene Houston, MD;  [facility];  Service: Endoscopy;  Laterality: N/A;  . Esophagogastroduodenoscopy N/A 10/25/2014    Procedure: ESOPHAGOGASTRODUODENOSCOPY (EGD);  Surgeon: Rogene Houston, MD;  [facility];  Service: Endoscopy;  Laterality: N/A;  855 - moved to 2/3 @ 2:00  . [place] dilation N/A 10/25/2014    Procedure: Venia Minks DILATION;  Surgeon: Rogene Houston, MD;  [facility];  Service: Endoscopy;  Laterality: N/A;    There were no vitals filed for this visit.  Visit Diagnosis:  Pain in surgical scar  Poor posture      Subjective Assessment - 10/01/15 0802    Subjective Pt states that he is felling just a tad of discomfort in the morning.  He is able to stretch and feel better.    Currently in Pain? No/denies   Pain Score 0-No pain                 [facility] Adult PT Treatment/Exercise - 10/01/15 0001    Neck Exercises: Standing   Other Standing Exercises Gt walking in upright position x 300'   Other Standing Exercises --   Neck Exercises: Supine   Neck Retraction 10 reps   Other Supine Exercise scapular retraction x 10    Shoulder Exercises: Standing   Other Standing Exercises Keep back against a wall with B flexion x 10   Modalities   Modalities Iontophoresis  Laser 6 treatment; 60J/cm2 1:24 each to scar area.    Iontophoresis   Type of Iontophoresis Saline   Location hypertrophic scaing    Dose 2cc   Time 14  35ma-min   Manual Therapy   Manual Therapy Myofascial release   Manual therapy comments supine position with knees flexed   Myofascial Release to reduce restriction along anterior chest and upper abdominal area.               PT  Education - 10/01/15 0848    Education provided Yes   Education Details self scar massage.   Person(s) Educated Patient   Methods Explanation   Comprehension Verbalized understanding          PT Short Term Goals - 10/01/15 0848    PT SHORT TERM GOAL #1   Title I in HEP to assist in proper posture to decrease constriction of soft tissue to improve pain    Time 2   Period Weeks   Status Achieved   PT SHORT TERM GOAL #2   Title PT to state pain is at the most a 7/10 to improve pt quality of life.    Time 2   Period  Weeks   Status Achieved           PT Long Term Goals - 10/01/15 0849    PT LONG TERM GOAL #1   Title PT I in advance HEP to address desensitization of scar and self scar mobilization to decrease pain to improve pt quality of life    Time 4   Period Weeks   Status Achieved   PT LONG TERM GOAL #2   Title Pt pain to be at the most a 5/10 to allow pt to wear a t-shirt wthout discomfort    Time 4   Period Weeks   Status Achieved               Plan - 10/01/15 0843    Clinical Impression Statement "Rash" area has cleared.  Pt has been pain free for over a week.  Pt instructed in scar massage and urged to use mcderma on scar area to decrease hypertrophic scaring.  Pt continues to have fascial restrections but remains painfree.   PT Next Visit Plan If pt stays painfree will discharge patient next treatment to self care.          Problem List Patient Active Problem List   Diagnosis Date Noted  . Constipation 03/23/2014  . Dysphagia 03/23/2014  . Neuropathy due to chemotherapeutic drug ([place]) 03/23/2014  . Xerostomia 06/10/2013  . Thrombocytopenia ([place]) 06/10/2013  . S/P radiation therapy   . 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 Orthostatic hypotension 05/11/2012  . Cancer of base of tongue ([place]) 11/17/2011  . Depression 10/01/2011  . Renal artery stenosis, native, bilateral ([place]) 09/03/2011  . 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 Ischemic heart disease  02/20/2011  . Hypercholesterolemia 02/20/2011  . Aortic valve stenosis, mild 02/20/2011    Rayetta Humphrey, PT CLT [phone] 10/01/2015, 8:51 AM  [place] [address] [place], [place], [address] Phone: [phone]   Fax:  [phone]  Name: George Bradley MRN: [id number] Date of Birth: 13-Dec-1940

## 2015-10-03 ENCOUNTER — Ambulatory Visit (HOSPITAL_COMMUNITY): Payer: Medicare Other | Admitting: Physical Therapy

## 2015-10-03 DIAGNOSIS — R293 Abnormal posture: Secondary | ICD-10-CM | POA: Diagnosis not present

## 2015-10-03 DIAGNOSIS — L905 Scar conditions and fibrosis of skin: Secondary | ICD-10-CM | POA: Diagnosis not present

## 2015-10-03 DIAGNOSIS — R52 Pain, unspecified: Principal | ICD-10-CM

## 2015-10-03 NOTE — Therapy (Signed)
[place], [place], [address] Phone: [phone]   Fax:  [phone]  Physical Therapy Treatment  Patient Details  Name: RAMERE DOWNS MRN: [id number] Date of Birth: 07-18-1941 Referring Provider: Virgina Jock  Encounter Date: 10/03/2015      PT End of Session - 10/03/15 0835    Visit Number 8   Number of Visits 8   Date for PT Re-Evaluation 10/05/15   Authorization Type medicare   Authorization - Visit Number 8   Authorization - Number of Visits 8   PT Start Time 0801   PT Stop Time 0850   PT Time Calculation (min) 49 min   Activity Tolerance Patient tolerated treatment well      Past Medical History  Diagnosis Date  . Ischemic heart disease   . Aortic stenosis, mild   . Hyperlipidemia   . Insomnia   . Fatigue   . Dizziness   . Hypotension     orthostatic  . History of echocardiogram 02/09/2009    EF -  55-60%  . History of cardiovascular stress test 03/14/2009    EF - 57%   /  mild anterior hyperkinesia with discrete anterior scar consistent with previous infarction but unchanged from previous studies.  Overall, stable findings -- [name], MD  . Weakness   . ASCVD (arteriosclerotic cardiovascular disease)   . Arrhythmia     known history of  . Weight loss, abnormal 2012  . 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 Protein-calorie malnutrition, moderate 2012    on PEG tube feeding  . Cellulitis of artificial external opening, post-operative 2012    of PEG tube site  . Mesenteric thrombosis   . 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 Coronary artery disease   . Thrombocytopenia, unspecified 06/10/2013    Past Surgical History  Procedure Laterality Date  . Coronary artery bypass graft  2000    x 4  . Cardiac catheterization  2007    EF - 55%  . Ablation of dysrhythmic focus    . Esophagogastroduodenoscopy  04/24/2011    Procedure:  ESOPHAGOGASTRODUODENOSCOPY (EGD);  Surgeon: Rogene Houston, MD;  [facility];  Service: Endoscopy;  Laterality: N/A;  8:30 am  . Peg placement  04/24/2011    Procedure: PERCUTANEOUS ENDOSCOPIC GASTROSTOMY (PEG) PLACEMENT;  Surgeon: Rogene Houston, MD;  [facility];  Service: Endoscopy;  Laterality: N/A;  . Esophagogastroduodenoscopy (egd) with esophageal dilation  09/02/2012    Procedure: ESOPHAGOGASTRODUODENOSCOPY (EGD) WITH ESOPHAGEAL DILATION;  Surgeon: Rogene Houston, MD;  [facility];  Service: Endoscopy;  Laterality: N/A;  245  . Esophageal dilation    . Esophagogastroduodenoscopy (egd) with esophageal dilation N/A 12/24/2012    Procedure: ESOPHAGOGASTRODUODENOSCOPY (EGD) WITH ESOPHAGEAL DILATION;  Surgeon: Rogene Houston, MD;  [facility];  Service: Endoscopy;  Laterality: N/A;  850  . Colonoscopy with esophagogastroduodenoscopy (egd) N/A 04/14/2013    Procedure: COLONOSCOPY WITH ESOPHAGOGASTRODUODENOSCOPY (EGD);  Surgeon: Rogene Houston, MD;  [facility];  Service: Endoscopy;  Laterality: N/A;  145  . Balloon dilation N/A 04/14/2013    Procedure: BALLOON DILATION;  Surgeon: Rogene Houston, MD;  [facility];  Service: Endoscopy;  Laterality: N/A;  Venia Minks dilation N/A 04/14/2013    Procedure: Venia Minks DILATION;  Surgeon: Rogene Houston, MD;  [facility];  Service:  Endoscopy;  Laterality: N/A;  . Savory dilation N/A 04/14/2013    Procedure: SAVORY DILATION;  Surgeon: Rogene Houston, MD;  [facility];  Service: Endoscopy;  Laterality: N/A;  . Esophagogastroduodenoscopy N/A 01/23/2014    Procedure: ESOPHAGOGASTRODUODENOSCOPY (EGD);  Surgeon: Rogene Houston, MD;  [facility];  Service: Endoscopy;  Laterality: N/A;  730  . Balloon dilation N/A 01/23/2014    Procedure: BALLOON DILATION;  Surgeon: Rogene Houston, MD;  [facility];  Service: Endoscopy;  Laterality: N/A;  Venia Minks  dilation N/A 01/23/2014    Procedure: Venia Minks DILATION;  Surgeon: Rogene Houston, MD;  [facility];  Service: Endoscopy;  Laterality: N/A;  . Savory dilation N/A 01/23/2014    Procedure: SAVORY DILATION;  Surgeon: Rogene Houston, MD;  [facility];  Service: Endoscopy;  Laterality: N/A;  . Esophagogastroduodenoscopy N/A 10/25/2014    Procedure: ESOPHAGOGASTRODUODENOSCOPY (EGD);  Surgeon: Rogene Houston, MD;  [facility];  Service: Endoscopy;  Laterality: N/A;  855 - moved to 2/3 @ 2:00  . Maloney dilation N/A 10/25/2014    Procedure: Venia Minks DILATION;  Surgeon: Rogene Houston, MD;  [facility];  Service: Endoscopy;  Laterality: N/A;    There were no vitals filed for this visit.  Visit Diagnosis:  Pain in surgical scar  Poor posture      Subjective Assessment - 10/03/15 0804    Subjective Pt states that goals have been far exceeded.  He is in no pain at this time.     Pertinent History CABG, throat cancer.    Currently in Pain? No/denies                         [facility] Adult PT Treatment/Exercise - 10/03/15 0001    Neck Exercises: Standing   Other Standing Exercises Gt walking in upright position x 300'   Neck Exercises: Supine   Neck Retraction --   Other Supine Exercise --   Shoulder Exercises: Standing   Other Standing Exercises --   Modalities   Modalities Iontophoresis  Laser 6 treatment; 60J/cm2 1:24 each to scar area.    Iontophoresis   Type of Iontophoresis Saline   Location hypertrophic scaing    Dose 2cc   Time 14  67m-min   Manual Therapy   Manual Therapy Myofascial release   Manual therapy comments supine position with knees flexed   Myofascial Release to reduce restriction along anterior chest and upper abdominal area.          manual was completed separate from any other activity during this treatment.        PT Education - 10/03/15 0834    Education provided Yes   Education Details Pt  shown the stress that having a forward head places on cervical area.     Person(s) Educated Patient   Methods Explanation   Comprehension Verbalized understanding          PT Short Term Goals - 10/03/15 0837    PT SHORT TERM GOAL #1   Title I in HEP to assist in proper posture to decrease constriction of soft tissue to improve pain    Time 2   Period Weeks   Status Achieved   PT SHORT TERM GOAL #2   Title PT to state pain is at the most a 7/10 to improve pt quality of life.    Time 2  Period Weeks   Status Achieved           PT Long Term Goals - 07-Oct-2015 1761    PT LONG TERM GOAL #1   Title PT I in advance HEP to address desensitization of scar and self scar mobilization to decrease pain to improve pt quality of life    Time 4   Period Weeks   Status Achieved   PT LONG TERM GOAL #2   Title Pt pain to be at the most a 5/10 to allow pt to wear a t-shirt wthout discomfort    Baseline Pain is at a 0   Time 4   Period Weeks   Status New               Plan - 10/07/15 6073    Clinical Impression Statement Pt continues to be pain free.  Pt has no questions on HEP.  Pt is ready for discharge.    Pt will benefit from skilled therapeutic intervention in order to improve on the following deficits Decreased scar mobility;Increased fascial restricitons;Postural dysfunction;Pain   PT Next Visit Plan Pt to be discharged to HEP           G-Codes - 2015-10-07 0838    Functional Assessment Tool Used foto   Functional Limitation Other PT primary   Other PT Primary Goal Status (X1062) At least 20 percent but less than 40 percent impaired, limited or restricted   Other PT Primary Discharge Status [phone]) At least 1 percent but less than 20 percent impaired, limited or restricted      Problem List Patient Active Problem List   Diagnosis Date Noted  . Constipation 03/23/2014  . Dysphagia 03/23/2014  . Neuropathy due to chemotherapeutic drug ([place]) 03/23/2014  . Xerostomia  06/10/2013  . Thrombocytopenia ([place]) 06/10/2013  . S/P radiation therapy   . 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 Orthostatic hypotension 05/11/2012  . Cancer of base of tongue ([place]) 11/17/2011  . Depression 10/01/2011  . Renal artery stenosis, native, bilateral ([place]) 09/03/2011  . 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 Ischemic heart disease 02/20/2011  . Hypercholesterolemia 02/20/2011  . Aortic valve stenosis, mild 02/20/2011    Rayetta Humphrey, PT CLT [phone] 2015/10/07, 8:46 AM  [facility] [address] [place], [place], [address] Phone: [phone]   Fax:  [phone]  Name: CARSYN TAUBMAN MRN: [id number] Date of Birth: 09-Jul-1941  PHYSICAL THERAPY DISCHARGE SUMMARY  Visits from Start of Care: 8  Current functional level related to goals / functional outcomes: PT painfree   Remaining deficits: none   Education / Equipment: Posture, scar massage  Plan: Patient agrees to discharge.  Patient goals were met. Patient is being discharged due to meeting the stated rehab goals.  ?????       Rayetta Humphrey, [place] CLT [phone]

## 2015-10-05 ENCOUNTER — Ambulatory Visit (HOSPITAL_COMMUNITY): Payer: Medicare Other

## 2015-10-22 ENCOUNTER — Other Ambulatory Visit: Payer: Self-pay | Admitting: Hematology and Oncology

## 2015-11-23 ENCOUNTER — Other Ambulatory Visit: Payer: Self-pay | Admitting: Hematology and Oncology

## 2015-11-26 ENCOUNTER — Other Ambulatory Visit (INDEPENDENT_AMBULATORY_CARE_PROVIDER_SITE_OTHER): Payer: Self-pay | Admitting: Internal Medicine

## 2015-11-27 ENCOUNTER — Encounter: Payer: Self-pay | Admitting: Cardiology

## 2015-11-27 ENCOUNTER — Ambulatory Visit (INDEPENDENT_AMBULATORY_CARE_PROVIDER_SITE_OTHER): Payer: Medicare Other | Admitting: Cardiology

## 2015-11-27 VITALS — BP 120/74 | HR 61 | Ht 73.0 in | Wt 182.6 lb

## 2015-11-27 DIAGNOSIS — I259 Chronic ischemic heart disease, unspecified: Secondary | ICD-10-CM

## 2015-11-27 DIAGNOSIS — I951 Orthostatic hypotension: Secondary | ICD-10-CM | POA: Diagnosis not present

## 2015-11-27 DIAGNOSIS — I350 Nonrheumatic aortic (valve) stenosis: Secondary | ICD-10-CM | POA: Diagnosis not present

## 2015-11-27 DIAGNOSIS — E7800 Pure hypercholesterolemia, unspecified: Secondary | ICD-10-CM

## 2015-11-27 NOTE — Patient Instructions (Signed)
Continue your current therapy  I will see you in 6 months.   

## 2015-11-28 ENCOUNTER — Other Ambulatory Visit (INDEPENDENT_AMBULATORY_CARE_PROVIDER_SITE_OTHER): Payer: Self-pay | Admitting: *Deleted

## 2015-11-28 MED ORDER — LORAZEPAM 1 MG PO TABS: 1.0000 mg | ORAL_TABLET | Freq: Three times a day (TID) | ORAL | Status: DC

## 2015-11-28 NOTE — Telephone Encounter (Signed)
A rx was written on 11/27/15 by Dr.[name]. This was put in the outgoing mail. Patient;s wife is here to pick it up. Courier is bringing me the one from yesterday this will be shredded.

## 2015-11-28 NOTE — Progress Notes (Signed)
Subjective:   George Bradley is seen for follow up CAD.  He had coronary artery bypass grafting in 2000. His last catheterization was in 2007 and showed patent saphenous vein grafts x3 with a patent left internal mammary artery and well-preserved LV function. His EF is 55%. His last nuclear stress test in 2/16 showed a small fixed apical defect with normal ejection fraction. No ischemia. He has a history of orthostatic hypotension and some lightheadedness on occasion. He is on no antihypertensive therapy. He's had a history of what sounds like a Maze procedure when he lived in [place]. His other problems include hyperlipidemia, insomnia, oral cancer, and a history of depression. 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 He has had esophageal dilitation several times in the past. He did have a chronically painful median sternotomy scar. He went through PT with cold laser treatment and desensitization therapy with marked improvement.  On followup today he is doing very well.  He loves to exercise and works out very vigorously. No chest pain or SOB.   Current Outpatient Prescriptions  Medication Sig Dispense Refill  . aspirin EC 81 MG tablet Take 81 mg by mouth at bedtime.     . gabapentin (NEURONTIN) 100 MG capsule Take 100-200 mg by mouth 3 (three) times daily. Takes 1 capsule twice daily and 2 capsules at bedtime    . levothyroxine (SYNTHROID, LEVOTHROID) 100 MCG tablet TAKE ONE TABLET BY MOUTH ONCE DAILY BEFORE  BREAKFAST 90 tablet 0  . pantoprazole (PROTONIX) 40 MG tablet TAKE 1 TABLET BY MOUTH TWICE DAILY BEFORE A MEAL 180 tablet 3  . pilocarpine (SALAGEN) 7.5 MG tablet TAKE 1 TABLET(7.5 MG) BY MOUTH THREE TIMES DAILY 90 tablet 0  . polyethylene glycol (MIRALAX / GLYCOLAX) packet Take 17 g by mouth daily as needed.    . rosuvastatin (CRESTOR) 20 MG tablet Take 1 tablet (20 mg total) by mouth at bedtime. 90 tablet 1  . vitamin B-12 (CYANOCOBALAMIN) 1000 MCG  tablet Take 1,000 mcg by mouth daily.    Marland Kitchen LORazepam (ATIVAN) 1 MG tablet Take 1 tablet (1 mg total) by mouth 3 (three) times daily. 270 tablet 0   No current facility-administered medications for this visit.    No Known Allergies  Patient Active Problem List   Diagnosis Date Noted  . Constipation 03/23/2014  . Dysphagia 03/23/2014  . Neuropathy due to chemotherapeutic drug ([place]) 03/23/2014  . Xerostomia 06/10/2013  . Thrombocytopenia ([place]) 06/10/2013  . S/P radiation therapy   . 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 Orthostatic hypotension 05/11/2012  . Cancer of base of tongue ([place]) 11/17/2011  . Depression 10/01/2011  . Renal artery stenosis, native, bilateral ([place]) 09/03/2011  . 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 Ischemic heart disease 02/20/2011  . Hypercholesterolemia 02/20/2011  . Aortic valve stenosis, mild 02/20/2011    History  Smoking status  . Former Smoker  . Types: Cigars  . Quit date: 09/21/2009  Smokeless tobacco  . Former Systems developer  . Quit date: 05/06/2011    Comment: 2 cigars a week    History  Alcohol Use No    Comment: occasionally glass of wine    Family History  Problem Relation Age of Onset  . Heart disease Father   . Heart disease Brother   . Heart disease Sister   . Cancer Sister     breast cancer  . Cancer Mother     breast ca  . Hyperlipidemia Son  Review of Systems:   As noted in history of present illness.  All other systems were reviewed and are negative.   Physical Exam:    BP 120/74 mmHg  Pulse 61  Ht 6\' 1"  (1.854 m)  Wt 82.827 kg (182 lb 9.6 oz)  BMI 24.10 kg/m2   The head is normocephalic and atraumatic.  Pupils are equally round and reactive to light.  Sclerae nonicteric.  Conjunctiva is clear.  Oropharynx is clear.   Neck is supple there are no masses.  Thyroid is not enlarged.  There is no lymphadenopathy.  Lungs are clear.  Chest is symmetric.  He has some keloid formation at sternal scar. Heart shows a regular rate and  rhythm.  S1 and S2 are normal.  There is a soft systolic outflow murmur.  Abdomen is soft normal bowel sounds. There is no organomegaly.   Extremities are without edema.  Peripheral pulses are adequate.  Neurologically intact.  Skin is warm and dry.  Laboratory data:   Ecg today shows NSR with rate 61. First degree AV block. LAFB, RBBB. No change. I have personally reviewed and interpreted this study.     Assessment / Plan:  1. Orthostatic hypotension. This has been a chronic issue. Patient is managing well with conservative measures. On no BP lowering drugs. Currently asymptomatic.  2.  Coronary disease status post CABG. Clinically asymptomatic. Negative myoview in 2/16. Continue risk factor modification. On ASA and statin. Continue  aerobic exercise.  3. 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 Currently cancer free.  4. Hyperlipidemia. Labs followed by Dr. Virgina Jock.  5. History of RAS - now normotensive.  6. Esophageal disease post radiation.  7. Bifascicular block- chronic and asymptomatic.

## 2015-12-10 ENCOUNTER — Telehealth (INDEPENDENT_AMBULATORY_CARE_PROVIDER_SITE_OTHER): Payer: Self-pay | Admitting: Internal Medicine

## 2015-12-10 NOTE — Telephone Encounter (Signed)
George Bradley, [name] wife called saying she believes he needs to have his esophagus stretched due to having trouble swallowing food. She wanted Dr. Laural Golden to know that last Thursday he had oral surgery performed where holes were filled from his mouth to the sinus region. Due to the surgery he has stitches in his mouth and she didn't know if Dr. Laural Golden would want to wait until after he has the follow-up appointment with the surgeon on next Thursday or if he thinks [name] will be fine to have his esophagus stretched beforehand. She'd like a phone call regarding this.  Pt's ph# [phone] Thank you.

## 2015-12-10 NOTE — Telephone Encounter (Signed)
To be addressed with Dr.[name] on 12/12/2015.

## 2015-12-11 NOTE — Telephone Encounter (Signed)
Per Dr.[name] scheduled for EGD. Patient's wife has not been called back yet.

## 2015-12-13 ENCOUNTER — Other Ambulatory Visit (INDEPENDENT_AMBULATORY_CARE_PROVIDER_SITE_OTHER): Payer: Self-pay | Admitting: Internal Medicine

## 2015-12-13 DIAGNOSIS — R1310 Dysphagia, unspecified: Secondary | ICD-10-CM

## 2015-12-13 NOTE — Telephone Encounter (Signed)
Patient called yesterday and wants procedure ASAp, I have a cancellation next Friday, is it ok to scheduled him then. He goes next Thursday to have stitches removed -- please advise

## 2015-12-13 NOTE — Telephone Encounter (Signed)
Already addressed

## 2015-12-14 ENCOUNTER — Encounter (HOSPITAL_COMMUNITY)
Admission: RE | Disposition: A | Discharge status: Home / Self Care | Payer: Self-pay | Source: Ambulatory Visit | Attending: Internal Medicine

## 2015-12-14 ENCOUNTER — Ambulatory Visit (HOSPITAL_COMMUNITY)
Admission: RE | Admit: 2015-12-14 | Discharge: 2015-12-14 | Disposition: A | Discharge status: Home / Self Care | Payer: Medicare Other | Source: Ambulatory Visit | Attending: Internal Medicine | Admitting: Internal Medicine

## 2015-12-14 ENCOUNTER — Encounter (HOSPITAL_COMMUNITY): Payer: Self-pay | Admitting: *Deleted

## 2015-12-14 DIAGNOSIS — B3781 Candidal esophagitis: Secondary | ICD-10-CM | POA: Insufficient documentation

## 2015-12-14 DIAGNOSIS — E039 Hypothyroidism, unspecified: Secondary | ICD-10-CM | POA: Insufficient documentation

## 2015-12-14 DIAGNOSIS — R1314 Dysphagia, pharyngoesophageal phase: Secondary | ICD-10-CM | POA: Diagnosis not present

## 2015-12-14 DIAGNOSIS — E785 Hyperlipidemia, unspecified: Secondary | ICD-10-CM | POA: Diagnosis not present

## 2015-12-14 DIAGNOSIS — Z85818 Personal history of malignant neoplasm of other sites of lip, oral cavity, and pharynx: Secondary | ICD-10-CM | POA: Insufficient documentation

## 2015-12-14 DIAGNOSIS — Z803 Family history of malignant neoplasm of breast: Secondary | ICD-10-CM | POA: Diagnosis not present

## 2015-12-14 DIAGNOSIS — I2510 Atherosclerotic heart disease of native coronary artery without angina pectoris: Secondary | ICD-10-CM | POA: Diagnosis not present

## 2015-12-14 DIAGNOSIS — Z923 Personal history of irradiation: Secondary | ICD-10-CM | POA: Diagnosis not present

## 2015-12-14 DIAGNOSIS — K449 Diaphragmatic hernia without obstruction or gangrene: Secondary | ICD-10-CM | POA: Insufficient documentation

## 2015-12-14 DIAGNOSIS — K222 Esophageal obstruction: Secondary | ICD-10-CM | POA: Insufficient documentation

## 2015-12-14 DIAGNOSIS — R1310 Dysphagia, unspecified: Secondary | ICD-10-CM | POA: Insufficient documentation

## 2015-12-14 HISTORY — PX: ESOPHAGEAL DILATION: SHX303

## 2015-12-14 HISTORY — PX: ESOPHAGOGASTRODUODENOSCOPY: SHX5428

## 2015-12-14 SURGERY — EGD (ESOPHAGOGASTRODUODENOSCOPY)
Anesthesia: Moderate Sedation

## 2015-12-14 MED ORDER — BUTAMBEN-TETRACAINE-BENZOCAINE 2-2-14 % EX AERO
INHALATION_SPRAY | CUTANEOUS | Status: DC | PRN
  Administered 2015-12-14: 2 via TOPICAL

## 2015-12-14 MED ORDER — MIDAZOLAM HCL 5 MG/5ML IJ SOLN
INTRAMUSCULAR | Status: AC
  Filled 2015-12-14: qty 10

## 2015-12-14 MED ORDER — SODIUM CHLORIDE 0.9% FLUSH
INTRAVENOUS | Status: AC
  Filled 2015-12-14: qty 10

## 2015-12-14 MED ORDER — MEPERIDINE HCL 50 MG/ML IJ SOLN
INTRAMUSCULAR | Status: AC
  Filled 2015-12-14: qty 1

## 2015-12-14 MED ORDER — SODIUM CHLORIDE 0.9 % IV SOLN
INTRAVENOUS | Status: DC
  Administered 2015-12-14: 1000 mL via INTRAVENOUS

## 2015-12-14 MED ORDER — MIDAZOLAM HCL 5 MG/5ML IJ SOLN
INTRAMUSCULAR | Status: DC | PRN
  Administered 2015-12-14: 3 mg via INTRAVENOUS
  Administered 2015-12-14: 1 mg via INTRAVENOUS

## 2015-12-14 MED ORDER — MEPERIDINE HCL 50 MG/ML IJ SOLN
INTRAMUSCULAR | Status: DC | PRN
  Administered 2015-12-14 (×2): 25 mg via INTRAVENOUS

## 2015-12-14 MED ORDER — NYSTATIN 100000 UNIT/ML MT SUSP: 5.0000 mL | Freq: Four times a day (QID) | OROMUCOSAL | Status: DC

## 2015-12-14 MED ORDER — PROMETHAZINE HCL 25 MG/ML IJ SOLN
12.5000 mg | Freq: Four times a day (QID) | INTRAMUSCULAR | Status: DC | PRN
  Administered 2015-12-14: 12.5 mg via INTRAVENOUS

## 2015-12-14 MED ORDER — PROMETHAZINE HCL 25 MG/ML IJ SOLN
INTRAMUSCULAR | Status: AC
  Filled 2015-12-14: qty 1

## 2015-12-14 NOTE — Op Note (Signed)
[facility] Patient Name: George Bradley Procedure Date: 12/14/2015 1:46 PM MRN: [id number] Date of Birth: June 17, 1941 Attending MD: Hildred Laser , MD CSN: [id number] Age: 75 Admit Type: Outpatient Procedure:                Upper GI endoscopy Indications:              Esophageal dysphagia Providers:                Hildred Laser, MD, Renda Rolls, RN, Isabella Stalling,                            Technician Referring MD:             [name], MD (Referring MD) Medicines:                Cetacaine spray, Meperidine 50 mg IV, Midazolam 4                            mg IV Complications:            No immediate complications. Estimated Blood Loss:     Estimated blood loss was minimal. Procedure:                Pre-Anesthesia Assessment:                           - Prior to the procedure, a History and Physical                            was performed, and patient medications and                            allergies were reviewed. The patient's tolerance of                            previous anesthesia was also reviewed. The risks                            and benefits of the procedure and the sedation                            options and risks were discussed with the patient.                            All questions were answered, and informed consent                            was obtained. Prior Anticoagulants: The patient                            last took aspirin 2 days prior to the procedure.                            ASA Grade Assessment: II - A patient with mild  systemic disease. After reviewing the risks and                            benefits, the patient was deemed in satisfactory                            condition to undergo the procedure.                           After obtaining informed consent, the endoscope was                            passed under direct vision. Throughout the                            procedure, the patient's blood  pressure, pulse, and                            oxygen saturations were monitored continuously. The                            [id number]) scope was introduced through the                            mouth, and advanced to the second part of duodenum.                            The patient tolerated the procedure fairly well. Scope In: 2:20:30 PM Scope Out: 2:29:44 PM Total Procedure Duration: 0 hours 9 minutes 14 seconds  Findings:      One mild (non-circumferential scarring) benign-appearing, intrinsic       stenosis was found 20 cm from the incisors. This measured 1.3 cm (inner       diameter) x 2 cm (in length) and was traversed. The scope was withdrawn.       Dilation was performed with a Maloney dilator with mild resistance at 52       Fr. The dilation site was examined following endoscope reinsertion and       showed mild improvement in luminal narrowing. Estimated blood loss was       minimal.      Patchy candidiasis was found in the middle third of the esophagus.      A 3 cm hiatal hernia was present.      The entire examined stomach was normal.      The duodenal bulb and second portion of the duodenum were normal. Impression:               - Benign-appearing esophageal stenosis. Dilatedby                            passing 52 [place] Maloney dilator                           - Mild Monilial esophagitis.                           -  3 cm hiatal hernia.                           - Normal stomach.                           - Normal duodenal bulb and second portion of the                            duodenum.                           - No specimens collected. Moderate Sedation:      Moderate (conscious) sedation was administered by the endoscopy nurse       and supervised by the endoscopist. The following parameters were       monitored: oxygen saturation, heart rate, blood pressure, CO2       capnography and response to care. Total physician intraservice time was       13  minutes. Recommendation:           - Patient has a contact number available for                            emergencies. The signs and symptoms of potential                            delayed complications were discussed with the                            patient. Return to normal activities tomorrow.                            Written discharge instructions were provided to the                            patient.                           - Resume previous diet today.                           - Continue present medications.                           - Nystatin suspension 400,000 units PO QID for 10                            days.                           - Return to my office in 3 months. Procedure Code(s):        --- Professional ---                           [phone], Esophagogastroduodenoscopy, flexible,  transoral; diagnostic, including collection of                            specimen(s) by brushing or washing, when performed                            (separate procedure)                           43450, Dilation of esophagus, by unguided sound or                            bougie, single or multiple passes                           99152, Moderate sedation services provided by the                            same physician or other qualified health care                            professional performing the diagnostic or                            therapeutic service that the sedation supports,                            requiring the presence of an independent trained                            observer to assist in the monitoring of the                            patient's level of consciousness and physiological                            status; initial 15 minutes of intraservice time,                            patient age 68 years or older Diagnosis Code(s):        --- Professional ---                           K22.2, Esophageal obstruction                            B37.81, Candidal esophagitis                           K44.9, Diaphragmatic hernia without obstruction or                            gangrene                           R13.14, Dysphagia, pharyngoesophageal phase CPT copyright 2016 American  Medical Association. All rights reserved. The codes documented in this report are preliminary and upon coder review may  be revised to meet current compliance requirements. Hildred Laser, MD Hildred Laser, MD 12/14/2015 2:48:49 PM This report has been signed electronically. Number of Addenda: 0

## 2015-12-14 NOTE — Discharge Instructions (Signed)
Resume aspirin on 12/18/2015. Resume other medications and diet as before. Mycostatin suspension 500,000 units swish and swallow until prescription runs out. No driving for 24 hours. Please call office with progress report in one week.     Esophagogastroduodenoscopy, Care After Refer to this sheet in the next few weeks. These instructions provide you with information about caring for yourself after your procedure. Your health care provider may also give you more specific instructions. Your treatment has been planned according to current medical practices, but problems sometimes occur. Call your health care provider if you have any problems or questions after your procedure. WHAT TO EXPECT AFTER THE PROCEDURE After your procedure, it is typical to feel:  Soreness in your throat.  Pain with swallowing.  Sick to your stomach (nauseous).  Bloated.  Dizzy.  Fatigued. HOME CARE INSTRUCTIONS  Do not eat or drink anything until the numbing medicine (local anesthetic) has worn off and your gag reflex has returned. You will know that the local anesthetic has worn off when you can swallow comfortably.  Do not drive or operate machinery until directed by your health care provider.  Take medicines only as directed by your health care provider. SEEK MEDICAL CARE IF:   You cannot stop coughing.  You are not urinating at all or less than usual. SEEK IMMEDIATE MEDICAL CARE IF:  You have difficulty swallowing.  You cannot eat or drink.  You have worsening throat or chest pain.  You have dizziness or lightheadedness or you faint.  You have nausea or vomiting.  You have chills.  You have a fever.  You have severe abdominal pain.  You have black, tarry, or bloody stools.   This information is not intended to replace advice given to you by your health care provider. Make sure you discuss any questions you have with your health care provider.   Document Released: 08/25/2012  Document Revised: 09/29/2014 Document Reviewed: 08/25/2012 Elsevier Interactive Patient Education [employer].

## 2015-12-14 NOTE — H&P (Signed)
George Bradley is an 75 y.o. male.   Chief Complaint: Patient is here for EGD and ED. HPI: Patient is 75 year old Caucasian male was history of radiation-induced proximal esophageal stricture who presents with 6 month history of dysphagia which has been gradually getting worse and now experience with every meal. He has not lost any weight. He says heartburn is well controlled with therapy. He has undergone multiple esophageal dilations in last dilation was in [place] 2016.  Past Medical History  Diagnosis Date  . Ischemic heart disease   . Aortic stenosis, mild   . Hyperlipidemia   . Insomnia   . Fatigue   . Dizziness   . Hypotension     orthostatic  . History of echocardiogram 02/09/2009    EF -  55-60%  . History of cardiovascular stress test 03/14/2009    EF - 57%   /  mild anterior hyperkinesia with discrete anterior scar consistent with previous infarction but unchanged from previous studies.  Overall, stable findings -- [name], MD  . Weakness   . ASCVD (arteriosclerotic cardiovascular disease)   . Arrhythmia     known history of  . Weight loss, abnormal 2012  . 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 Protein-calorie malnutrition, moderate ([place]) 2012    on PEG tube feeding  . Cellulitis of artificial external opening, post-operative 2012    of PEG tube site  . 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 Coronary artery disease   . Thrombocytopenia, unspecified ([place]) 06/10/2013    Past Surgical History  Procedure Laterality Date  . Coronary artery bypass graft  2000    x 4  . Cardiac catheterization  2007    EF - 55%  . Ablation of dysrhythmic focus    . Esophagogastroduodenoscopy  04/24/2011    Procedure: ESOPHAGOGASTRODUODENOSCOPY (EGD);  Surgeon: Rogene Houston, MD;  [facility];  Service: Endoscopy;  Laterality: N/A;  8:30 am  . Peg placement  04/24/2011   Procedure: PERCUTANEOUS ENDOSCOPIC GASTROSTOMY (PEG) PLACEMENT;  Surgeon: Rogene Houston, MD;  [facility];  Service: Endoscopy;  Laterality: N/A;  . Esophagogastroduodenoscopy (egd) with esophageal dilation  09/02/2012    Procedure: ESOPHAGOGASTRODUODENOSCOPY (EGD) WITH ESOPHAGEAL DILATION;  Surgeon: Rogene Houston, MD;  [facility];  Service: Endoscopy;  Laterality: N/A;  245  . Esophageal dilation    . Esophagogastroduodenoscopy (egd) with esophageal dilation N/A 12/24/2012    Procedure: ESOPHAGOGASTRODUODENOSCOPY (EGD) WITH ESOPHAGEAL DILATION;  Surgeon: Rogene Houston, MD;  [facility];  Service: Endoscopy;  Laterality: N/A;  850  . Colonoscopy with esophagogastroduodenoscopy (egd) N/A 04/14/2013    Procedure: COLONOSCOPY WITH ESOPHAGOGASTRODUODENOSCOPY (EGD);  Surgeon: Rogene Houston, MD;  [facility];  Service: Endoscopy;  Laterality: N/A;  145  . Balloon dilation N/A 04/14/2013    Procedure: BALLOON DILATION;  Surgeon: Rogene Houston, MD;  [facility];  Service: Endoscopy;  Laterality: N/A;  Venia Minks dilation N/A 04/14/2013    Procedure: Venia Minks DILATION;  Surgeon: Rogene Houston, MD;  [facility];  Service: Endoscopy;  Laterality: N/A;  . Savory dilation N/A 04/14/2013    Procedure: SAVORY DILATION;  Surgeon: Rogene Houston, MD;  [facility];  Service: Endoscopy;  Laterality: N/A;  . Esophagogastroduodenoscopy N/A 01/23/2014    Procedure: ESOPHAGOGASTRODUODENOSCOPY (EGD);  Surgeon: Mechele Dawley  Laural Golden, MD;  [facility];  Service: Endoscopy;  Laterality: N/A;  730  . Balloon dilation N/A 01/23/2014    Procedure: BALLOON DILATION;  Surgeon: Rogene Houston, MD;  [facility];  Service: Endoscopy;  Laterality: N/A;  Venia Minks dilation N/A 01/23/2014    Procedure: Venia Minks DILATION;  Surgeon: Rogene Houston, MD;  [facility];  Service: Endoscopy;  Laterality: N/A;  . Savory dilation  N/A 01/23/2014    Procedure: SAVORY DILATION;  Surgeon: Rogene Houston, MD;  [facility];  Service: Endoscopy;  Laterality: N/A;  . Esophagogastroduodenoscopy N/A 10/25/2014    Procedure: ESOPHAGOGASTRODUODENOSCOPY (EGD);  Surgeon: Rogene Houston, MD;  [facility];  Service: Endoscopy;  Laterality: N/A;  855 - moved to 2/3 @ 2:00  . Maloney dilation N/A 10/25/2014    Procedure: Venia Minks DILATION;  Surgeon: Rogene Houston, MD;  [facility];  Service: Endoscopy;  Laterality: N/A;    Family History  Problem Relation Age of Onset  . Heart disease Father   . Heart disease Brother   . Heart disease Sister   . Cancer Sister     breast cancer  . Cancer Mother     breast ca  . Hyperlipidemia Son    Social History:  reports that he quit smoking about 6 years ago. His smoking use included Cigars. He quit smokeless tobacco use about 4 years ago. He reports that he does not drink alcohol or use illicit drugs.  Allergies: No Known Allergies  Medications Prior to Admission  Medication Sig Dispense Refill  . aspirin EC 81 MG tablet Take 81 mg by mouth at bedtime.     . gabapentin (NEURONTIN) 100 MG capsule Take 100-200 mg by mouth 3 (three) times daily. Takes 1 capsule twice daily and 2 capsules at bedtime    . levothyroxine (SYNTHROID, LEVOTHROID) 100 MCG tablet TAKE ONE TABLET BY MOUTH ONCE DAILY BEFORE  BREAKFAST 90 tablet 0  . LORazepam (ATIVAN) 1 MG tablet Take 1 tablet (1 mg total) by mouth 3 (three) times daily. 270 tablet 0  . pantoprazole (PROTONIX) 40 MG tablet TAKE 1 TABLET BY MOUTH TWICE DAILY BEFORE A MEAL 180 tablet 3  . pilocarpine (SALAGEN) 7.5 MG tablet TAKE 1 TABLET(7.5 MG) BY MOUTH THREE TIMES DAILY 90 tablet 0  . polyethylene glycol (MIRALAX / GLYCOLAX) packet Take 17 g by mouth daily as needed.    . rosuvastatin (CRESTOR) 20 MG tablet Take 1 tablet (20 mg total) by mouth at bedtime. 90 tablet 1  . vitamin B-12 (CYANOCOBALAMIN) 1000 MCG tablet Take  1,000 mcg by mouth daily.      No results found for this or any previous visit (from the past 48 hour(s)). No results found.  ROS  Blood pressure 161/77, pulse 60, temperature 98 F (36.7 C), resp. rate 16, height 6\' 1"  (1.854 m), weight 183 lb (83.008 kg), SpO2 100 %. Physical Exam  Constitutional: He appears well-developed and well-nourished.  HENT:  Mouth/Throat: Oropharynx is clear and moist.  Eyes: Conjunctivae are normal. No scleral icterus.  Neck: No thyromegaly present.  Cardiovascular: Normal rate and regular rhythm.   Murmur (grade 2/6 systolic ejection murmur best heard at left sternal border.) heard. Respiratory: Effort normal and breath sounds normal.  GI: Soft. He exhibits no distension and no mass. There is no tenderness.  Musculoskeletal: He exhibits no edema.  Lymphadenopathy:    He has no cervical adenopathy.  Neurological:  He is alert.  Skin: Skin is warm and dry.     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 EGD with ED.  Rogene Houston, MD 12/14/2015, 2:11 PM

## 2015-12-20 ENCOUNTER — Encounter (HOSPITAL_COMMUNITY): Payer: Self-pay | Admitting: Internal Medicine

## 2015-12-28 ENCOUNTER — Ambulatory Visit (HOSPITAL_BASED_OUTPATIENT_CLINIC_OR_DEPARTMENT_OTHER): Payer: Medicare Other | Admitting: Hematology and Oncology

## 2015-12-28 ENCOUNTER — Telehealth: Payer: Self-pay | Admitting: Hematology and Oncology

## 2015-12-28 ENCOUNTER — Encounter: Payer: Self-pay | Admitting: Hematology and Oncology

## 2015-12-28 ENCOUNTER — Other Ambulatory Visit (HOSPITAL_BASED_OUTPATIENT_CLINIC_OR_DEPARTMENT_OTHER): Payer: Medicare Other

## 2015-12-28 VITALS — BP 130/64 | HR 81 | Temp 97.9°F | Resp 18 | Wt 181.6 lb

## 2015-12-28 DIAGNOSIS — E039 Hypothyroidism, unspecified: Secondary | ICD-10-CM

## 2015-12-28 DIAGNOSIS — C01 Malignant neoplasm of base of tongue: Secondary | ICD-10-CM | POA: Diagnosis not present

## 2015-12-28 DIAGNOSIS — K117 Disturbances of salivary secretion: Secondary | ICD-10-CM

## 2015-12-28 DIAGNOSIS — R682 Dry mouth, unspecified: Principal | ICD-10-CM

## 2015-12-28 DIAGNOSIS — E038 Other specified hypothyroidism: Secondary | ICD-10-CM

## 2015-12-28 DIAGNOSIS — R1310 Dysphagia, unspecified: Secondary | ICD-10-CM | POA: Diagnosis not present

## 2015-12-28 LAB — CBC WITH DIFFERENTIAL/PLATELET
BASO%: 0.7 % (ref 0.0–2.0)
BASOS ABS: 0 10*3/uL (ref 0.0–0.1)
EOS ABS: 0.1 10*3/uL (ref 0.0–0.5)
EOS%: 1.6 % (ref 0.0–7.0)
HEMATOCRIT: 43.3 % (ref 38.4–49.9)
HGB: 14.4 g/dL (ref 13.0–17.1)
LYMPH#: 2 10*3/uL (ref 0.9–3.3)
LYMPH%: 33.3 % (ref 14.0–49.0)
MCH: 32 pg (ref 27.2–33.4)
MCHC: 33.3 g/dL (ref 32.0–36.0)
MCV: 96.3 fL (ref 79.3–98.0)
MONO#: 0.6 10*3/uL (ref 0.1–0.9)
MONO%: 10.5 % (ref 0.0–14.0)
NEUT#: 3.2 10*3/uL (ref 1.5–6.5)
NEUT%: 53.9 % (ref 39.0–75.0)
PLATELETS: 141 10*3/uL (ref 140–400)
RBC: 4.5 10*6/uL (ref 4.20–5.82)
RDW: 13.9 % (ref 11.0–14.6)
WBC: 6 10*3/uL (ref 4.0–10.3)

## 2015-12-28 LAB — COMPREHENSIVE METABOLIC PANEL
ALBUMIN: 4.1 g/dL (ref 3.5–5.0)
ALK PHOS: 32 U/L — AB (ref 40–150)
ALT: 31 U/L (ref 0–55)
ANION GAP: 8 meq/L (ref 3–11)
AST: 33 U/L (ref 5–34)
BILIRUBIN TOTAL: 0.53 mg/dL (ref 0.20–1.20)
BUN: 16.5 mg/dL (ref 7.0–26.0)
CALCIUM: 10 mg/dL (ref 8.4–10.4)
CO2: 26 mEq/L (ref 22–29)
Chloride: 107 mEq/L (ref 98–109)
Creatinine: 1.4 mg/dL — ABNORMAL HIGH (ref 0.7–1.3)
EGFR: 51 mL/min/{1.73_m2} — AB (ref >90)
Glucose: 98 mg/dl (ref 70–140)
POTASSIUM: 4.5 meq/L (ref 3.5–5.1)
Sodium: 141 mEq/L (ref 136–145)
TOTAL PROTEIN: 7.7 g/dL (ref 6.4–8.3)

## 2015-12-28 LAB — TSH: TSH: 2.214 m(IU)/L (ref 0.320–4.118)

## 2015-12-28 MED ORDER — PILOCARPINE HCL 7.5 MG PO TABS: ORAL_TABLET | ORAL | Status: DC

## 2015-12-28 NOTE — Assessment & Plan Note (Signed)
I refilled the prescription of Pilocarpine. According to the patient, it is helping him.

## 2015-12-28 NOTE — Assessment & Plan Note (Signed)
Clinically, he has no signs of recurrence. I reinforced the importance of ENT follow-up. He is a long-term cancer survivor. I will transition his care to cancer survivorship clinic.

## 2015-12-28 NOTE — Assessment & Plan Note (Signed)
This is related to side effects of prior radiation. He has esophageal dilatation recently that seems to help. He denies recent choking or aspiration. 

## 2015-12-28 NOTE — Telephone Encounter (Signed)
per pof to sch pt appt-gave pt copy of avs °

## 2015-12-28 NOTE — Progress Notes (Signed)
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 PET/CT scan showed no evidence of distant metastatic disease however he does have evidence of lymphadenopathy. He was offered concurrent chemoradiation therapy with weekly cisplatin between August to October 2012. From 2012 to 2014 he had serial imaging studies which show no evidence of disease recurrence.  INTERVAL HISTORY: Please see below for problem oriented charting. He feels well apart from persistent dysphagia and dry mouth. He is using Pilocarpine which seems to be helping with dry mouth. The esophageal dilatation was helping. He denies new lymphadenopathy. He had recent dental issues, resolved with additional oral surgery. He follows with dentist on the regular basis.  REVIEW OF SYSTEMS:   Constitutional: Denies fevers, chills or abnormal weight loss Eyes: Denies blurriness of vision Ears, nose, mouth, throat, and face: Denies mucositis or sore throat Respiratory: Denies cough, dyspnea or wheezes Cardiovascular: Denies palpitation, chest discomfort or lower extremity swelling Gastrointestinal:  Denies nausea, heartburn or change in bowel habits Skin: Denies abnormal skin rashes Lymphatics: Denies new lymphadenopathy or easy bruising Neurological:Denies numbness, tingling or new weaknesses Behavioral/Psych: Mood is stable, no new changes  All other systems were reviewed with the patient and are negative.  I have reviewed the past medical history, past surgical history,  social history and family history with the patient and they are unchanged from previous note.  ALLERGIES:  has No Known Allergies.  MEDICATIONS:  Current Outpatient Prescriptions  Medication Sig Dispense Refill  . aspirin EC 81 MG tablet Take 1 tablet (81 mg total) by mouth at bedtime.    . gabapentin (NEURONTIN) 100 MG capsule Take 100-200 mg by mouth 3 (three) times daily. Takes 1 capsule twice daily and 2 capsules at bedtime    . levothyroxine (SYNTHROID, LEVOTHROID) 100 MCG tablet TAKE ONE TABLET BY MOUTH ONCE DAILY BEFORE  BREAKFAST 90 tablet 0  . LORazepam (ATIVAN) 1 MG tablet Take 1 tablet (1 mg total) by mouth 3 (three) times daily. 270 tablet 0  . pilocarpine (SALAGEN) 7.5 MG tablet TAKE 1 TABLET(7.5 MG) BY MOUTH THREE TIMES DAILY 90 tablet 3  . polyethylene glycol (MIRALAX / GLYCOLAX) packet Take 17 g by mouth daily as needed.    . rosuvastatin (CRESTOR) 20 MG tablet Take 1 tablet (20 mg total) by mouth at bedtime. 90 tablet 1  . vitamin B-12 (CYANOCOBALAMIN) 1000 MCG tablet Take 1,000 mcg by mouth daily.     No current facility-administered medications for this visit.    PHYSICAL EXAMINATION: ECOG PERFORMANCE STATUS: 0 - Asymptomatic  Filed Vitals:   12/28/15 0909  BP: 130/64  Pulse: 81  Temp: 97.9 F (36.6 C)  Resp: 18   Filed Weights   12/28/15 0909  Weight: 181 lb 9.6 oz (82.373 kg)    GENERAL:alert, no distress and comfortable SKIN: skin color, texture, turgor are normal, no rashes or significant lesions EYES: normal, Conjunctiva are pink and non-injected, sclera clear OROPHARYNX:no exudate, no erythema and lips, buccal mucosa, and tongue normal. Poor Dentition is noted NECK: Neck is fibrosed from prior radiation.  LYMPH:  no palpable  lymphadenopathy in the cervical, axillary or inguinal LUNGS: clear to auscultation and percussion with normal breathing effort HEART: regular rate & rhythm and no murmurs and no lower extremity edema ABDOMEN:abdomen soft,  non-tender and normal bowel sounds Musculoskeletal:no cyanosis of digits and no clubbing  NEURO: alert & oriented x 3 with fluent speech, no focal motor/sensory deficits  LABORATORY DATA:  I have reviewed the data as listed    Component Value Date/Time   NA 141 12/28/2015 0851   NA 139 10/13/2014 1538   NA 140 03/31/2012 0838   K 4.5 12/28/2015 0851   K 4.0 10/13/2014 1538   K 4.5 03/31/2012 0838   CL 106 10/13/2014 1538   CL 106 11/25/2012 0812   CL 100 03/31/2012 0838   CO2 26 12/28/2015 0851   CO2 27 10/13/2014 1538   CO2 31 03/31/2012 0838   GLUCOSE 98 12/28/2015 0851   GLUCOSE 92 10/13/2014 1538   GLUCOSE 102* 11/25/2012 0812   GLUCOSE 99 03/31/2012 0838   BUN 16.5 12/28/2015 0851   BUN 26* 10/13/2014 1538   BUN 19 03/31/2012 0838   CREATININE 1.4* 12/28/2015 0851   CREATININE 1.44* 10/13/2014 1538   CREATININE 1.26 08/02/2012 1208   CALCIUM 10.0 12/28/2015 0851   CALCIUM 9.7 10/13/2014 1538   CALCIUM 9.4 03/31/2012 0838   PROT 7.7 12/28/2015 0851   PROT 7.4 10/13/2014 1538   PROT 7.5 03/31/2012 0838   ALBUMIN 4.1 12/28/2015 0851   ALBUMIN 4.3 10/13/2014 1538   ALBUMIN 4.0 03/31/2012 0838   AST 33 12/28/2015 0851   AST 34 10/13/2014 1538   AST 30 03/31/2012 0838   ALT 31 12/28/2015 0851   ALT 32 10/13/2014 1538   ALT 29 03/31/2012 0838   ALKPHOS 32* 12/28/2015 0851   ALKPHOS 30* 10/13/2014 1538   ALKPHOS 36 03/31/2012 0838   BILITOT 0.53 12/28/2015 0851   BILITOT 0.6 10/13/2014 1538   BILITOT 0.70 03/31/2012 0838   GFRNONAA 47* 10/13/2014 1538   GFRAA 54* 10/13/2014 1538    No results found for: SPEP, UPEP  Lab Results  Component Value Date   WBC 6.0 12/28/2015   NEUTROABS 3.2 12/28/2015   HGB 14.4 12/28/2015   HCT 43.3 12/28/2015   MCV 96.3 12/28/2015   PLT 141 12/28/2015      Chemistry      Component Value Date/Time   NA 141 12/28/2015 0851   NA 139 10/13/2014 1538   NA 140 03/31/2012 0838   K 4.5 12/28/2015 0851   K 4.0 10/13/2014 1538    K 4.5 03/31/2012 0838   CL 106 10/13/2014 1538   CL 106 11/25/2012 0812   CL 100 03/31/2012 0838   CO2 26 12/28/2015 0851   CO2 27 10/13/2014 1538   CO2 31 03/31/2012 0838   BUN 16.5 12/28/2015 0851   BUN 26* 10/13/2014 1538   BUN 19 03/31/2012 0838   CREATININE 1.4* 12/28/2015 0851   CREATININE 1.44* 10/13/2014 1538   CREATININE 1.26 08/02/2012 1208      Component Value Date/Time   CALCIUM 10.0 12/28/2015 0851   CALCIUM 9.7 10/13/2014 1538   CALCIUM 9.4 03/31/2012 0838   ALKPHOS 32* 12/28/2015 0851   ALKPHOS 30* 10/13/2014 1538   ALKPHOS 36 03/31/2012 0838   AST 33 12/28/2015 0851   AST 34 10/13/2014 1538   AST 30 03/31/2012 0838   ALT 31 12/28/2015 0851   ALT 32 10/13/2014 1538   ALT 29 03/31/2012 0838   BILITOT 0.53 12/28/2015  0851   BILITOT 0.6 10/13/2014 1538   BILITOT 0.70 03/31/2012 0838     ASSESSMENT & PLAN:  Cancer of base of tongue Clinically, he has no signs of recurrence. I reinforced the importance of ENT follow-up. He is a long-term cancer survivor. I will transition his care to cancer survivorship clinic.  Dysphagia This is related to side effects of prior radiation. He has esophageal dilatation recently that seems to help. He denies recent choking or aspiration.  Xerostomia I refilled the prescription of Pilocarpine. According to the patient, it is helping him.      Orders Placed This Encounter  Procedures  . Amb Referral to Survivorship Long term    Referral Priority:  Routine    Referral Type:  Consultation    Number of Visits Requested:  1   All questions were answered. The patient knows to call the clinic with any problems, questions or concerns. No barriers to learning was detected. I spent 15 minutes counseling the patient face to face. The total time spent in the appointment was 20 minutes and more than 50% was on counseling and review of test results     [facility], [place], MD 12/28/2015 9:37 AM

## 2016-01-15 ENCOUNTER — Encounter (INDEPENDENT_AMBULATORY_CARE_PROVIDER_SITE_OTHER): Payer: Self-pay | Admitting: *Deleted

## 2016-01-15 DIAGNOSIS — N183 Chronic kidney disease, stage 3 (moderate): Secondary | ICD-10-CM | POA: Diagnosis not present

## 2016-01-15 DIAGNOSIS — E038 Other specified hypothyroidism: Secondary | ICD-10-CM | POA: Diagnosis not present

## 2016-01-15 DIAGNOSIS — Z125 Encounter for screening for malignant neoplasm of prostate: Secondary | ICD-10-CM | POA: Diagnosis not present

## 2016-01-15 DIAGNOSIS — E784 Other hyperlipidemia: Secondary | ICD-10-CM | POA: Diagnosis not present

## 2016-02-14 DIAGNOSIS — Z1212 Encounter for screening for malignant neoplasm of rectum: Secondary | ICD-10-CM | POA: Diagnosis not present

## 2016-02-19 ENCOUNTER — Other Ambulatory Visit (INDEPENDENT_AMBULATORY_CARE_PROVIDER_SITE_OTHER): Payer: Self-pay | Admitting: Internal Medicine

## 2016-02-21 ENCOUNTER — Telehealth (INDEPENDENT_AMBULATORY_CARE_PROVIDER_SITE_OTHER): Payer: Self-pay | Admitting: Internal Medicine

## 2016-02-21 NOTE — Telephone Encounter (Signed)
Patient called, stated that [facility] faxed a medication request 3-4 days ago for Lorazepam 1mg  and it's not there yet.  He is running low.  [phone]

## 2016-02-26 NOTE — Telephone Encounter (Signed)
This has been refilled and given to [facility]

## 2016-03-15 ENCOUNTER — Other Ambulatory Visit: Payer: Self-pay | Admitting: Cardiology

## 2016-03-17 NOTE — Telephone Encounter (Signed)
Rx request sent to pharmacy.  

## 2016-03-26 ENCOUNTER — Encounter (INDEPENDENT_AMBULATORY_CARE_PROVIDER_SITE_OTHER): Payer: Self-pay | Admitting: Internal Medicine

## 2016-03-26 ENCOUNTER — Encounter (INDEPENDENT_AMBULATORY_CARE_PROVIDER_SITE_OTHER): Payer: Self-pay | Admitting: *Deleted

## 2016-03-26 ENCOUNTER — Ambulatory Visit (INDEPENDENT_AMBULATORY_CARE_PROVIDER_SITE_OTHER): Payer: Medicare Other | Admitting: Internal Medicine

## 2016-03-26 ENCOUNTER — Other Ambulatory Visit (INDEPENDENT_AMBULATORY_CARE_PROVIDER_SITE_OTHER): Payer: Self-pay | Admitting: Internal Medicine

## 2016-03-26 VITALS — BP 110/58 | HR 72 | Temp 98.3°F | Ht 73.0 in | Wt 180.3 lb

## 2016-03-26 DIAGNOSIS — I259 Chronic ischemic heart disease, unspecified: Secondary | ICD-10-CM | POA: Diagnosis not present

## 2016-03-26 DIAGNOSIS — K219 Gastro-esophageal reflux disease without esophagitis: Secondary | ICD-10-CM

## 2016-03-26 DIAGNOSIS — K222 Esophageal obstruction: Secondary | ICD-10-CM

## 2016-03-26 NOTE — Patient Instructions (Addendum)
EGD/ED. The risks and benefits such as perforation, bleeding, and infection were reviewed with the patient and is agreeable. 

## 2016-03-26 NOTE — Progress Notes (Addendum)
Subjective:    Patient ID: George Bradley, male    DOB: Nov 07, 1940, 75 y.o.   MRN: [id number]  HPI Here today for f/u. He was last seen in August of 2016. He underwent and EGD/ED in February of 2016. He has a proximal esophageal stricture secondary; to radiation therapy and has undergone multiple dilatations. Weight at last visit 179. Today his weight ix 180.3. Hx of chronic abdominal and takes Ativan and Gabapentin for this.  He tells me he is doing good,. He has maintained his weight  Appetite is good. He states he wants his esophagus stretched is having dysphagia to solids. Occasionally has acid reflux and takes Tums as needed.  CBC    Component Value Date/Time   WBC 6.0 12/28/2015 0851   WBC 5.1 10/13/2014 1538   RBC 4.50 12/28/2015 0851   RBC 4.12* 10/13/2014 1538   HGB 14.4 12/28/2015 0851   HGB 13.4 10/13/2014 1538   HCT 43.3 12/28/2015 0851   HCT 39.4 10/13/2014 1538   PLT 141 12/28/2015 0851   PLT 133* 10/13/2014 1538   MCV 96.3 12/28/2015 0851   MCV 95.6 10/13/2014 1538   MCH 32.0 12/28/2015 0851   MCH 32.5 10/13/2014 1538   MCHC 33.3 12/28/2015 0851   MCHC 34.0 10/13/2014 1538   RDW 13.9 12/28/2015 0851   RDW 13.8 10/13/2014 1538   LYMPHSABS 2.0 12/28/2015 0851   LYMPHSABS 1.4 10/13/2014 1538   MONOABS 0.6 12/28/2015 0851   MONOABS 0.7 10/13/2014 1538   EOSABS 0.1 12/28/2015 0851   EOSABS 0.1 10/13/2014 1538   BASOSABS 0.0 12/28/2015 0851   BASOSABS 0.0 10/13/2014 1538            Review of Systems Past Medical History  Diagnosis Date  . Ischemic heart disease   . Aortic stenosis, mild   . Hyperlipidemia   . Insomnia   . Fatigue   . Dizziness   . Hypotension     orthostatic  . History of echocardiogram 02/09/2009    EF -  55-60%  . History of cardiovascular stress test 03/14/2009    EF - 57%   /  mild anterior hyperkinesia with discrete anterior scar consistent with previous infarction but unchanged from previous studies.  Overall, stable findings  -- [name], MD  . Weakness   . ASCVD (arteriosclerotic cardiovascular disease)   . Arrhythmia     known history of  . Weight loss, abnormal 2012  . 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 Protein-calorie malnutrition, moderate ([place]) 2012    on PEG tube feeding  . Cellulitis of artificial external opening, post-operative 2012    of PEG tube site  . 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 Coronary artery disease   . Thrombocytopenia, unspecified ([place]) 06/10/2013    Past Surgical History  Procedure Laterality Date  . Coronary artery bypass graft  2000    x 4  . Cardiac catheterization  2007    EF - 55%  . Ablation of dysrhythmic focus    . Esophagogastroduodenoscopy  04/24/2011    Procedure: ESOPHAGOGASTRODUODENOSCOPY (EGD);  Surgeon: Rogene Houston, MD;  [facility];  Service: Endoscopy;  Laterality: N/A;  8:30 am  . Peg placement  04/24/2011    Procedure: PERCUTANEOUS ENDOSCOPIC GASTROSTOMY (PEG) PLACEMENT;  Surgeon: Rogene Houston, MD;  [facility];  Service: Endoscopy;  Laterality:  N/A;  . Esophagogastroduodenoscopy (egd) with esophageal dilation  09/02/2012    Procedure: ESOPHAGOGASTRODUODENOSCOPY (EGD) WITH ESOPHAGEAL DILATION;  Surgeon: Rogene Houston, MD;  [facility];  Service: Endoscopy;  Laterality: N/A;  245  . Esophageal dilation    . Esophagogastroduodenoscopy (egd) with esophageal dilation N/A 12/24/2012    Procedure: ESOPHAGOGASTRODUODENOSCOPY (EGD) WITH ESOPHAGEAL DILATION;  Surgeon: Rogene Houston, MD;  [facility];  Service: Endoscopy;  Laterality: N/A;  850  . Colonoscopy with esophagogastroduodenoscopy (egd) N/A 04/14/2013    Procedure: COLONOSCOPY WITH ESOPHAGOGASTRODUODENOSCOPY (EGD);  Surgeon: Rogene Houston, MD;  [facility];  Service: Endoscopy;  Laterality: N/A;  145  . Balloon dilation N/A  04/14/2013    Procedure: BALLOON DILATION;  Surgeon: Rogene Houston, MD;  [facility];  Service: Endoscopy;  Laterality: N/A;  Venia Minks dilation N/A 04/14/2013    Procedure: Venia Minks DILATION;  Surgeon: Rogene Houston, MD;  [facility];  Service: Endoscopy;  Laterality: N/A;  . Savory dilation N/A 04/14/2013    Procedure: SAVORY DILATION;  Surgeon: Rogene Houston, MD;  [facility];  Service: Endoscopy;  Laterality: N/A;  . Esophagogastroduodenoscopy N/A 01/23/2014    Procedure: ESOPHAGOGASTRODUODENOSCOPY (EGD);  Surgeon: Rogene Houston, MD;  [facility];  Service: Endoscopy;  Laterality: N/A;  730  . Balloon dilation N/A 01/23/2014    Procedure: BALLOON DILATION;  Surgeon: Rogene Houston, MD;  [facility];  Service: Endoscopy;  Laterality: N/A;  Venia Minks dilation N/A 01/23/2014    Procedure: Venia Minks DILATION;  Surgeon: Rogene Houston, MD;  [facility];  Service: Endoscopy;  Laterality: N/A;  . Savory dilation N/A 01/23/2014    Procedure: SAVORY DILATION;  Surgeon: Rogene Houston, MD;  [facility];  Service: Endoscopy;  Laterality: N/A;  . Esophagogastroduodenoscopy N/A 10/25/2014    Procedure: ESOPHAGOGASTRODUODENOSCOPY (EGD);  Surgeon: Rogene Houston, MD;  [facility];  Service: Endoscopy;  Laterality: N/A;  855 - moved to 2/3 @ 2:00  . Maloney dilation N/A 10/25/2014    Procedure: Venia Minks DILATION;  Surgeon: Rogene Houston, MD;  [facility];  Service: Endoscopy;  Laterality: N/A;  . Esophagogastroduodenoscopy N/A 12/14/2015    Procedure: ESOPHAGOGASTRODUODENOSCOPY (EGD);  Surgeon: Rogene Houston, MD;  [facility];  Service: Endoscopy;  Laterality: N/A;  200  . Esophageal dilation N/A 12/14/2015    Procedure: ESOPHAGEAL DILATION;  Surgeon: Rogene Houston, MD;  [facility];  Service: Endoscopy;  Laterality: N/A;    No Known Allergies  Current Outpatient  Prescriptions on File Prior to Visit  Medication Sig Dispense Refill  . aspirin EC 81 MG tablet Take 1 tablet (81 mg total) by mouth at bedtime.    . gabapentin (NEURONTIN) 100 MG capsule Take 100-200 mg by mouth 3 (three) times daily. Takes 1 capsule twice daily and 2 capsules at bedtime    . levothyroxine (SYNTHROID, LEVOTHROID) 100 MCG tablet TAKE ONE TABLET BY MOUTH ONCE DAILY BEFORE  BREAKFAST 90 tablet 0  . LORazepam (ATIVAN) 1 MG tablet TAKE 1 TABLET BY MOUTH THREE TIMES DAILY 270 tablet 0  . pilocarpine (SALAGEN) 7.5 MG tablet TAKE 1 TABLET(7.5 MG) BY MOUTH THREE TIMES DAILY 90 tablet 3  . polyethylene glycol (MIRALAX / GLYCOLAX) packet Take 17 g by mouth daily as needed.    . rosuvastatin (CRESTOR) 20 MG tablet TAKE 1 TABLET BY  MOUTH EVERY NIGHT AT BEDTIME 90 tablet 1  . vitamin B-12 (CYANOCOBALAMIN) 1000 MCG tablet Take 1,000 mcg by mouth daily.     No current facility-administered medications on file prior to visit.        Objective:   Physical Exam Blood pressure 110/58, pulse 72, temperature 98.3 F (36.8 C), height 6\' 1"  (1.854 m), weight 180 lb 4.8 oz (81.784 kg). Alert and oriented. Skin warm and dry. Oral mucosa is moist.   . Sclera anicteric, conjunctivae is pink. Thyroid not enlarged. No cervical lymphadenopathy. Lungs clear. Heart regular rate and rhythm. Murmur heard  Abdomen is soft. Bowel sounds are positive. No hepatomegaly. No abdominal masses felt. No tenderness.  No edema to lower extremities.          Assessment & Plan:   #1. 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 Needs EGD/ED #2. GERD. Occasionally takes Tums. #3. 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 #4. 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 Condition was diagnosed 4 years ago

## 2016-04-02 ENCOUNTER — Other Ambulatory Visit: Payer: Self-pay | Admitting: Hematology and Oncology

## 2016-04-10 ENCOUNTER — Telehealth: Payer: Self-pay | Admitting: Cardiology

## 2016-04-10 NOTE — Telephone Encounter (Signed)
Returned call to patient's wife.She stated husband wanted to see Dr.Jordn only.Stated he has been dizzy and thinks heart valve is worse.Appointment scheduled with Dr.Jordan 04/22/16 at 10:30 am.

## 2016-04-10 NOTE — Telephone Encounter (Signed)
New message     Pt wife is calling to get a sooner appt with Dr.Jordan   Please call pt

## 2016-04-21 NOTE — Progress Notes (Signed)
Subjective:   George Bradley is seen for follow up CAD and dizziness.  He had coronary artery bypass grafting in 2000. His last catheterization was in 2007 and showed patent saphenous vein grafts x3 with a patent left internal mammary artery and well-preserved LV function. His EF is 55%. His last nuclear stress test in 2/16 showed a small fixed apical defect with normal ejection fraction. No ischemia. He has a history of orthostatic hypotension and some lightheadedness on occasion. He is on no antihypertensive therapy. He's had a history of what sounds like a Maze procedure when he lived in [place]. His other problems include hyperlipidemia, insomnia, oral cancer, and a history of depression. 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 He has had esophageal dilitation several times in the past. He does have a chronically painful median sternotomy scar. He went through PT with cold laser treatment and desensitization therapy with some improvement.  On followup today he reports increased frequency of orthostatic dizziness. Before it would only last less than a minute but now may last 5-10 minutes. Sometimes he has to grab onto something and put his head down before symptoms resolve. He actually feels better with exercise. He is maintaining good hydration. He states this has been more noticeable over the past 6 months. No syncope.   No chest pain or SOB.   Current Outpatient Prescriptions  Medication Sig Dispense Refill  . aspirin EC 81 MG tablet Take 1 tablet (81 mg total) by mouth at bedtime.    . gabapentin (NEURONTIN) 100 MG capsule Take 100-200 mg by mouth 3 (three) times daily. Takes 1 capsule twice daily and 2 capsules at bedtime    . levothyroxine (SYNTHROID, LEVOTHROID) 100 MCG tablet TAKE ONE TABLET BY MOUTH ONCE DAILY BEFORE  BREAKFAST 90 tablet 0  . LORazepam (ATIVAN) 1 MG tablet TAKE 1 TABLET BY MOUTH THREE TIMES DAILY 270 tablet 0  . pilocarpine (SALAGEN)  7.5 MG tablet TAKE 1 TABLET BY MOUTH THREE TIMES DAILY 270 tablet 3  . polyethylene glycol (MIRALAX / GLYCOLAX) packet Take 17 g by mouth daily as needed.    . rosuvastatin (CRESTOR) 20 MG tablet TAKE 1 TABLET BY MOUTH EVERY NIGHT AT BEDTIME 90 tablet 1  . vitamin B-12 (CYANOCOBALAMIN) 1000 MCG tablet Take 1,000 mcg by mouth daily.    Marland Kitchen zolpidem (AMBIEN) 10 MG tablet Take 10 mg by mouth at bedtime as needed for sleep. Reported on 03/26/2016     No current facility-administered medications for this visit.     No Known Allergies  Patient Active Problem List  Diagnosis  . Ischemic heart disease  . Hypercholesterolemia  . Aortic valve stenosis, mild  . Renal artery stenosis, native, bilateral ([place])  . Thrombosis of mesenteric vein  . Depression  . Cancer of base of tongue ([place])  . Orthostatic hypotension  . Hypothyroid  . S/P radiation therapy  . Xerostomia  . Thrombocytopenia ([place])  . Constipation  . Dysphagia  . Neuropathy due to chemotherapeutic drug ([place])    History  Smoking Status  . Former Smoker  . Types: Cigars  . Quit date: 09/21/2009  Smokeless Tobacco  . Former Systems developer  . Quit date: 05/06/2011    Comment: 2 cigars a week    History  Alcohol Use No    Comment: occasionally glass of wine    Family History  Problem Relation Age of Onset  . Heart disease Father   . Heart disease Brother   .  Heart disease Sister   . Cancer Sister     breast cancer  . Cancer Mother     breast ca  . Hyperlipidemia Son     Review of Systems:   As noted in history of present illness.  All other systems were reviewed and are negative.   Physical Exam:    BP 130/78 (BP Location: Left Arm, Patient Position: Sitting, Cuff Size: Normal)   Pulse (!) 53   Ht 6\' 1"  (1.854 m)   Wt 181 lb 3.2 oz (82.2 kg)   BMI 23.91 kg/m   Orthostatic vitals by my exam:  Supine BP 164/60, sitting- 158/80, standing 132/70 with little change in pulse.   The head is normocephalic and atraumatic.   Pupils are equally round and reactive to light.  Sclerae nonicteric.  Conjunctiva is clear.  Oropharynx is clear.   Neck is supple there are no masses.  Thyroid is not enlarged.  There is no lymphadenopathy.  Lungs are clear.  Chest is symmetric.  He has some keloid formation at sternal scar. Heart shows a regular rate and rhythm.  S1 and S2 are normal.  There is a soft systolic outflow murmur.  Abdomen is soft normal bowel sounds. There is no organomegaly.   Extremities are without edema.  Peripheral pulses are adequate.  Neurologically intact.  Skin is warm and dry.  Laboratory data:   Labs from Dr. Virgina Bradley dated 01/15/16: cholesterol 185, triglycerides 95, HDL 44, LDL 122. Chemistry, Hct, and TSH normal.  Assessment / Plan:  1. Orthostatic hypotension. This has been a chronic issue but symptoms have increased. He is orthostatic on exam with standing but BP does not drop to severely low levels. I have recommended conservative measures. Liberalize sodium in diet. Maintain good hydration. Use support stockings. Use contraction of major muscle groups prior to standing.  On no BP lowering drugs. We discussed medical therapy with Florinef or midodrine but I would not recommend at this point. He is actually quite afraid to take Florinef since a family member had major problems with this drug.   2.  Coronary disease status post CABG. Clinically asymptomatic. Negative myoview in 2/16. Continue risk factor modification. On ASA and statin. Continue  aerobic exercise.  3. 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 Currently cancer free.  4. Hyperlipidemia. Labs followed by Dr. Virgina Bradley.  5. History of RAS - now normotensive.  6. Esophageal disease post radiation. Still requiring periodic dilation.   7. Bifascicular block- chronic and asymptomatic.   I will follow up in 6 months.

## 2016-04-22 ENCOUNTER — Ambulatory Visit (INDEPENDENT_AMBULATORY_CARE_PROVIDER_SITE_OTHER): Payer: Medicare Other | Admitting: Cardiology

## 2016-04-22 ENCOUNTER — Encounter: Payer: Self-pay | Admitting: Cardiology

## 2016-04-22 VITALS — BP 130/78 | HR 53 | Ht 73.0 in | Wt 181.2 lb

## 2016-04-22 DIAGNOSIS — I350 Nonrheumatic aortic (valve) stenosis: Secondary | ICD-10-CM | POA: Diagnosis not present

## 2016-04-22 DIAGNOSIS — I951 Orthostatic hypotension: Secondary | ICD-10-CM

## 2016-04-22 DIAGNOSIS — I259 Chronic ischemic heart disease, unspecified: Secondary | ICD-10-CM

## 2016-04-22 NOTE — Patient Instructions (Signed)
Continue your current therapy  Liberalize your salt intake  Use muscle contractions prior to standing  Stay  Well hydrated.  I will see you in 6 months.

## 2016-05-01 ENCOUNTER — Encounter (HOSPITAL_COMMUNITY)
Admission: RE | Disposition: A | Discharge status: Home / Self Care | Payer: Self-pay | Source: Ambulatory Visit | Attending: Internal Medicine

## 2016-05-01 ENCOUNTER — Ambulatory Visit (HOSPITAL_COMMUNITY)
Admission: RE | Admit: 2016-05-01 | Discharge: 2016-05-01 | Disposition: A | Discharge status: Home / Self Care | Payer: Medicare Other | Source: Ambulatory Visit | Attending: Internal Medicine | Admitting: Internal Medicine

## 2016-05-01 ENCOUNTER — Encounter (HOSPITAL_COMMUNITY): Payer: Self-pay | Admitting: *Deleted

## 2016-05-01 DIAGNOSIS — E785 Hyperlipidemia, unspecified: Secondary | ICD-10-CM | POA: Diagnosis not present

## 2016-05-01 DIAGNOSIS — R1310 Dysphagia, unspecified: Secondary | ICD-10-CM | POA: Diagnosis present

## 2016-05-01 DIAGNOSIS — I2510 Atherosclerotic heart disease of native coronary artery without angina pectoris: Secondary | ICD-10-CM | POA: Insufficient documentation

## 2016-05-01 DIAGNOSIS — G4700 Insomnia, unspecified: Secondary | ICD-10-CM | POA: Insufficient documentation

## 2016-05-01 DIAGNOSIS — Z79899 Other long term (current) drug therapy: Secondary | ICD-10-CM | POA: Insufficient documentation

## 2016-05-01 DIAGNOSIS — K210 Gastro-esophageal reflux disease with esophagitis: Secondary | ICD-10-CM | POA: Insufficient documentation

## 2016-05-01 DIAGNOSIS — K222 Esophageal obstruction: Secondary | ICD-10-CM | POA: Diagnosis not present

## 2016-05-01 DIAGNOSIS — Z7982 Long term (current) use of aspirin: Secondary | ICD-10-CM | POA: Diagnosis not present

## 2016-05-01 DIAGNOSIS — Z85818 Personal history of malignant neoplasm of other sites of lip, oral cavity, and pharynx: Secondary | ICD-10-CM | POA: Diagnosis not present

## 2016-05-01 DIAGNOSIS — Z87891 Personal history of nicotine dependence: Secondary | ICD-10-CM | POA: Insufficient documentation

## 2016-05-01 DIAGNOSIS — K449 Diaphragmatic hernia without obstruction or gangrene: Secondary | ICD-10-CM | POA: Insufficient documentation

## 2016-05-01 DIAGNOSIS — I350 Nonrheumatic aortic (valve) stenosis: Secondary | ICD-10-CM | POA: Diagnosis not present

## 2016-05-01 DIAGNOSIS — R1314 Dysphagia, pharyngoesophageal phase: Secondary | ICD-10-CM | POA: Diagnosis not present

## 2016-05-01 DIAGNOSIS — E039 Hypothyroidism, unspecified: Secondary | ICD-10-CM | POA: Insufficient documentation

## 2016-05-01 HISTORY — PX: ESOPHAGOGASTRODUODENOSCOPY: SHX5428

## 2016-05-01 HISTORY — PX: ESOPHAGEAL DILATION: SHX303

## 2016-05-01 SURGERY — EGD (ESOPHAGOGASTRODUODENOSCOPY)
Anesthesia: Moderate Sedation

## 2016-05-01 MED ORDER — MEPERIDINE HCL 50 MG/ML IJ SOLN
INTRAMUSCULAR | Status: DC | PRN
  Administered 2016-05-01 (×2): 25 mg

## 2016-05-01 MED ORDER — PROMETHAZINE HCL 25 MG/ML IJ SOLN
12.5000 mg | Freq: Once | INTRAMUSCULAR | Status: AC
  Administered 2016-05-01: 12.5 mg via INTRAVENOUS

## 2016-05-01 MED ORDER — PANTOPRAZOLE SODIUM 40 MG PO TBEC: 40.0000 mg | DELAYED_RELEASE_TABLET | Freq: Every day | ORAL | 5 refills | Status: DC

## 2016-05-01 MED ORDER — PROMETHAZINE HCL 25 MG/ML IJ SOLN
INTRAMUSCULAR | Status: AC
  Filled 2016-05-01: qty 1

## 2016-05-01 MED ORDER — MIDAZOLAM HCL 5 MG/5ML IJ SOLN
INTRAMUSCULAR | Status: DC | PRN
  Administered 2016-05-01: 1 mg via INTRAVENOUS
  Administered 2016-05-01 (×3): 2 mg via INTRAVENOUS

## 2016-05-01 MED ORDER — BUTAMBEN-TETRACAINE-BENZOCAINE 2-2-14 % EX AERO
INHALATION_SPRAY | CUTANEOUS | Status: DC | PRN
  Administered 2016-05-01: 2 via TOPICAL

## 2016-05-01 MED ORDER — SODIUM CHLORIDE 0.9% FLUSH
INTRAVENOUS | Status: AC
  Filled 2016-05-01: qty 10

## 2016-05-01 MED ORDER — MIDAZOLAM HCL 5 MG/5ML IJ SOLN
INTRAMUSCULAR | Status: AC
  Filled 2016-05-01: qty 10

## 2016-05-01 MED ORDER — STERILE WATER FOR IRRIGATION IR SOLN
Status: DC | PRN
  Administered 2016-05-01: 15:00:00

## 2016-05-01 MED ORDER — MEPERIDINE HCL 50 MG/ML IJ SOLN
INTRAMUSCULAR | Status: AC
  Filled 2016-05-01: qty 1

## 2016-05-01 MED ORDER — SODIUM CHLORIDE 0.9 % IV SOLN
INTRAVENOUS | Status: DC
  Administered 2016-05-01: 1000 mL via INTRAVENOUS

## 2016-05-01 MED ORDER — BUTAMBEN-TETRACAINE-BENZOCAINE 2-2-14 % EX AERO
INHALATION_SPRAY | CUTANEOUS | Status: AC
  Filled 2016-05-01: qty 20

## 2016-05-01 NOTE — H&P (Signed)
George Bradley is an 75 y.o. male.   Chief Complaint: Patient is here for EGD and ED. 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 He states is really bad. Last esophageal dilation was in March this year and the stricture was dilated to 43 [place]. Prior to that he wasn't febrile 2016. He continues complain of epigastric pain. Gabapentin dose was increased eyes physician at pain clinic but it did not help. He feels lorazepam is providing significant relief. His appetite is fair and he is maintaining his weight. He says he remains in remission as well as head and neck CA is concerned.  Past Medical History:  Diagnosis Date  . Aortic stenosis, mild   . Arrhythmia    known history of  . ASCVD (arteriosclerotic cardiovascular disease)   . Cellulitis of artificial external opening, post-operative 2012   of PEG tube site  . Coronary artery disease   . Dizziness   . Fatigue   . History of cardiovascular stress test 03/14/2009   EF - 57%   /  mild anterior hyperkinesia with discrete anterior scar consistent with previous infarction but unchanged from previous studies.  Overall, stable findings -- [name], MD  . History of echocardiogram 02/09/2009   EF -  55-60%  . Hyperlipidemia   . Hypotension    orthostatic  . 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 Insomnia   . Ischemic heart disease   . 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 Protein-calorie malnutrition, moderate ([place]) 2012   on PEG tube feeding  . S/P radiation therapy 05/13/11-07/04/11   7000 cGy base of tongue Carcinoma  . Thrombocytopenia, unspecified ([place]) 06/10/2013  . Weakness   . Weight loss, abnormal 2012    Past Surgical History:  Procedure Laterality Date  . ABLATION OF DYSRHYTHMIC FOCUS    . BALLOON DILATION N/A 04/14/2013   Procedure: BALLOON DILATION;  Surgeon:  Rogene Houston, MD;  [facility];  Service: Endoscopy;  Laterality: N/A;  . BALLOON DILATION N/A 01/23/2014   Procedure: BALLOON DILATION;  Surgeon: Rogene Houston, MD;  [facility];  Service: Endoscopy;  Laterality: N/A;  . CARDIAC CATHETERIZATION  2007   EF - 55%  . COLONOSCOPY WITH ESOPHAGOGASTRODUODENOSCOPY (EGD) N/A 04/14/2013   Procedure: COLONOSCOPY WITH ESOPHAGOGASTRODUODENOSCOPY (EGD);  Surgeon: Rogene Houston, MD;  [facility];  Service: Endoscopy;  Laterality: N/A;  145  . CORONARY ARTERY BYPASS GRAFT  2000   x 4  . ESOPHAGEAL DILATION    . ESOPHAGEAL DILATION N/A 12/14/2015   Procedure: ESOPHAGEAL DILATION;  Surgeon: Rogene Houston, MD;  [facility];  Service: Endoscopy;  Laterality: N/A;  . ESOPHAGOGASTRODUODENOSCOPY  04/24/2011   Procedure: ESOPHAGOGASTRODUODENOSCOPY (EGD);  Surgeon: Rogene Houston, MD;  [facility];  Service: Endoscopy;  Laterality: N/A;  8:30 am  . ESOPHAGOGASTRODUODENOSCOPY N/A 01/23/2014   Procedure: ESOPHAGOGASTRODUODENOSCOPY (EGD);  Surgeon: Rogene Houston, MD;  [facility];  Service: Endoscopy;  Laterality: N/A;  730  . ESOPHAGOGASTRODUODENOSCOPY N/A 10/25/2014   Procedure: ESOPHAGOGASTRODUODENOSCOPY (EGD);  Surgeon: Rogene Houston, MD;  [facility];  Service: Endoscopy;  Laterality: N/A;  855 - moved to 2/3 @ 2:00  . ESOPHAGOGASTRODUODENOSCOPY N/A 12/14/2015   Procedure: ESOPHAGOGASTRODUODENOSCOPY (EGD);  Surgeon: Rogene Houston, MD;  [facility];  Service: Endoscopy;  Laterality: N/A;  200  . ESOPHAGOGASTRODUODENOSCOPY (EGD) WITH ESOPHAGEAL DILATION  09/02/2012   Procedure: ESOPHAGOGASTRODUODENOSCOPY (EGD) WITH ESOPHAGEAL DILATION;  Surgeon: Rogene Houston, MD;  [facility];  Service: Endoscopy;  Laterality: N/A;  245  . ESOPHAGOGASTRODUODENOSCOPY (EGD) WITH ESOPHAGEAL DILATION N/A 12/24/2012   Procedure: ESOPHAGOGASTRODUODENOSCOPY (EGD) WITH  ESOPHAGEAL DILATION;  Surgeon: Rogene Houston, MD;  [facility];  Service: Endoscopy;  Laterality: N/A;  850  . MALONEY DILATION N/A 04/14/2013   Procedure: Venia Minks DILATION;  Surgeon: Rogene Houston, MD;  [facility];  Service: Endoscopy;  Laterality: N/A;  . MALONEY DILATION N/A 01/23/2014   Procedure: Venia Minks DILATION;  Surgeon: Rogene Houston, MD;  [facility];  Service: Endoscopy;  Laterality: N/A;  . Venia Minks DILATION N/A 10/25/2014   Procedure: Venia Minks DILATION;  Surgeon: Rogene Houston, MD;  [facility];  Service: Endoscopy;  Laterality: N/A;  . PEG PLACEMENT  04/24/2011   Procedure: PERCUTANEOUS ENDOSCOPIC GASTROSTOMY (PEG) PLACEMENT;  Surgeon: Rogene Houston, MD;  [facility];  Service: Endoscopy;  Laterality: N/A;  . SAVORY DILATION N/A 04/14/2013   Procedure: SAVORY DILATION;  Surgeon: Rogene Houston, MD;  [facility];  Service: Endoscopy;  Laterality: N/A;  . SAVORY DILATION N/A 01/23/2014   Procedure: SAVORY DILATION;  Surgeon: Rogene Houston, MD;  [facility];  Service: Endoscopy;  Laterality: N/A;    Family History  Problem Relation Age of Onset  . Heart disease Father   . Heart disease Brother   . Heart disease Sister   . Cancer Sister     breast cancer  . Cancer Mother     breast ca  . Hyperlipidemia Son    Social History:  reports that he quit smoking about 6 years ago. His smoking use included Cigars. He quit smokeless tobacco use about 4 years ago. He reports that he does not drink alcohol or use drugs.  Allergies: No Known Allergies  Medications Prior to Admission  Medication Sig Dispense Refill  . gabapentin (NEURONTIN) 100 MG capsule Take 100-200 mg by mouth 3 (three) times daily. Takes 1 capsule twice daily and 2 capsules at bedtime    . levothyroxine (SYNTHROID, LEVOTHROID) 100 MCG tablet TAKE ONE TABLET BY MOUTH ONCE DAILY BEFORE  BREAKFAST 90 tablet 0  . LORazepam (ATIVAN) 1  MG tablet TAKE 1 TABLET BY MOUTH THREE TIMES DAILY 270 tablet 0  . pilocarpine (SALAGEN) 7.5 MG tablet TAKE 1 TABLET BY MOUTH THREE TIMES DAILY 270 tablet 3  . polyethylene glycol (MIRALAX / GLYCOLAX) packet Take 17 g by mouth daily as needed.    . rosuvastatin (CRESTOR) 20 MG tablet TAKE 1 TABLET BY MOUTH EVERY NIGHT AT BEDTIME 90 tablet 1  . vitamin B-12 (CYANOCOBALAMIN) 1000 MCG tablet Take 1,000 mcg by mouth daily.    Marland Kitchen aspirin EC 81 MG tablet Take 1 tablet (81 mg total) by mouth at bedtime.    Marland Kitchen zolpidem (AMBIEN) 10 MG tablet Take 10 mg by mouth at bedtime as needed for sleep. Reported on 03/26/2016      No results found for this or any previous visit (from the past 48 hour(s)). No results found.  ROS  Blood pressure (!) 175/89, pulse (!) 59, temperature 98 F (36.7 C), temperature source Oral, resp. rate (!) 9, height 6\' 1"  (1.854 m), weight 181 lb (82.1 kg), SpO2 99 %. Physical Exam  Constitutional: He appears well-developed and  well-nourished.  HENT:  Mouth/Throat: Oropharynx is clear and moist.  Eyes: Conjunctivae are normal. No scleral icterus.  Neck: No thyromegaly present.  Cardiovascular: Normal rate, regular rhythm and normal heart sounds.   No murmur heard. Respiratory: Effort normal and breath sounds normal.  GI: Soft. He exhibits no distension and no mass. There is no tenderness.  Musculoskeletal: He exhibits no edema.  Lymphadenopathy:    He has no cervical adenopathy.  Neurological: He is alert.  Skin: Skin is warm and dry.     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 EGD with ED.  Hildred Laser, MD 05/01/2016, 2:54 PM

## 2016-05-01 NOTE — Op Note (Signed)
[facility] Patient Name: George Bradley Procedure Date: 05/01/2016 2:43 PM MRN: [id number] Date of Birth: 1941-01-03 Attending MD: Hildred Laser , MD CSN: [id number] Age: 75 Admit Type: Outpatient Procedure:                Upper GI endoscopy Indications:              Esophageal dysphagia, For therapy of esophageal                            stricture Providers:                Hildred Laser, MD, Otis Peak B. Sharon Seller, RN, Isabella Stalling, Technician Referring MD:             [name] MD, MD Medicines:                Cetacaine spray, Meperidine 50 mg IV, Midazolam 7                            mg IV, Promethazine [id number] mg IV Complications:            No immediate complications. Estimated Blood Loss:     Estimated blood loss was minimal. Procedure:                Pre-Anesthesia Assessment:                           - Prior to the procedure, a History and Physical                            was performed, and patient medications and                            allergies were reviewed. The patient's tolerance of                            previous anesthesia was also reviewed. The risks                            and benefits of the procedure and the sedation                            options and risks were discussed with the patient.                            All questions were answered, and informed consent                            was obtained. Prior Anticoagulants: The patient                            last took aspirin 3 days and ibuprofen 1 day prior  to the procedure. ASA Grade Assessment: III - A                            patient with severe systemic disease. After                            reviewing the risks and benefits, the patient was                            deemed in satisfactory condition to undergo the                            procedure.                           After obtaining informed consent, the endoscope  was                            passed under direct vision. Throughout the                            procedure, the patient's blood pressure, pulse, and                            oxygen saturations were monitored continuously. The                            [id number] scope was introduced through the                            mouth, and advanced to the second part of duodenum.                            The upper GI endoscopy was accomplished without                            difficulty. The patient tolerated the procedure                            well. Scope In: 3:07:16 PM Scope Out: 3:21:54 PM Total Procedure Duration: 0 hours 14 minutes 38 seconds  Findings:      One mild benign-appearing, intrinsic stenosis was found 19 cm from the       incisors. This measured 1.3 cm (inner diameter) x less than one cm (in       length) and was traversed. The scope was withdrawn. Dilation was       performed with a Maloney dilator with mild resistance at 50 Fr, 52 Fr       and 54 Fr. The dilation site was examined and showed mild improvement in       luminal narrowing. small linear mucosal disruption noted post dilation.      LA Grade A (one or more mucosal breaks less than 5 mm, not extending       between tops of 2 mucosal folds) esophagitis with no bleeding was found  39 cm from the incisors.      The Z-line was regular and was found 41 cm from the incisors.      A 2 cm hiatal hernia was present.      The entire examined stomach was normal.      The duodenal bulb and second portion of the duodenum were normal. Impression:               - Benign-appearing esophageal stenosis. Dilated.                           - LA Grade A reflux esophagitis.                           - Z-line regular, 41 cm from the incisors.                           - 2 cm hiatal hernia.                           - Normal stomach.                           - Normal duodenal bulb and second portion of the                             duodenum.                           - No specimens collected. Moderate Sedation:      Moderate (conscious) sedation was administered by the endoscopy nurse       and supervised by the endoscopist. The following parameters were       monitored: oxygen saturation, heart rate, blood pressure, CO2       capnography and response to care. Total physician intraservice time was       22 minutes. Recommendation:           - Patient has a contact number available for                            emergencies. The signs and symptoms of potential                            delayed complications were discussed with the                            patient. Return to normal activities tomorrow.                            Written discharge instructions were provided to the                            patient.                           - Resume previous diet today.                           -  Continue present medications.                           - Pantoprazole 40 mg by mouth every morning.                           - Resume aspirin at prior dose tomorrow.                           - Repeat dilation as needed. Procedure Code(s):        --- Professional ---                           [phone], Esophagogastroduodenoscopy, flexible,                            transoral; diagnostic, including collection of                            specimen(s) by brushing or washing, when performed                            (separate procedure)                           43450, Dilation of esophagus, by unguided sound or                            bougie, single or multiple passes                           99152, Moderate sedation services provided by the                            same physician or other qualified health care                            professional performing the diagnostic or                            therapeutic service that the sedation supports,                            requiring  the presence of an independent trained                            observer to assist in the monitoring of the                            patient's level of consciousness and physiological                            status; initial 15 minutes of intraservice time,                            patient  age 34 years or older Diagnosis Code(s):        --- Professional ---                           K22.2, Esophageal obstruction                           K21.0, Gastro-esophageal reflux disease with                            esophagitis                           K44.9, Diaphragmatic hernia without obstruction or                            gangrene                           R13.14, Dysphagia, pharyngoesophageal phase CPT copyright 2016 American Medical Association. All rights reserved. The codes documented in this report are preliminary and upon coder review may  be revised to meet current compliance requirements. Hildred Laser, MD Hildred Laser, MD 05/01/2016 3:36:56 PM This report has been signed electronically. Number of Addenda: 0

## 2016-05-01 NOTE — Discharge Instructions (Signed)
Pantoprazole 40 mg by mouth 30 days before breakfast daily. Resume aspirin on 05/02/2016. Resume other medications and diet as before. No driving for 24 hours. Please call office with progress report in one week  Gastrointestinal Endoscopy, Care After Refer to this sheet in the next few weeks. These instructions provide you with information on caring for yourself after your procedure. Your caregiver may also give you more specific instructions. Your treatment has been planned according to current medical practices, but problems sometimes occur. Call your caregiver if you have any problems or questions after your procedure. HOME CARE INSTRUCTIONS  If you were given medicine to help you relax (sedative), do not drive, operate machinery, or sign important documents for 24 hours.  Avoid alcohol and hot or warm beverages for the first 24 hours after the procedure.  Only take over-the-counter or prescription medicines for pain, discomfort, or fever as directed by your caregiver. You may resume taking your normal medicines unless your caregiver tells you otherwise. Ask your caregiver when you may resume taking medicines that may cause bleeding, such as aspirin, clopidogrel, or warfarin.  You may return to your normal diet and activities on the day after your procedure, or as directed by your caregiver. Walking may help to reduce any bloated feeling in your abdomen.  Drink enough fluids to keep your urine clear or pale yellow.  You may gargle with salt water if you have a sore throat. SEEK IMMEDIATE MEDICAL CARE IF:  You have severe nausea or vomiting.  You have severe abdominal pain, abdominal cramps that last longer than 6 hours, or abdominal swelling (distention).  You have severe shoulder or back pain.  You have trouble swallowing.  You have shortness of breath, your breathing is shallow, or you are breathing faster than normal.  You have a fever or a rapid heartbeat.  You vomit blood  or material that looks like coffee grounds.  You have bloody, black, or tarry stools. MAKE SURE YOU:  Understand these instructions.  Will watch your condition.  Will get help right away if you are not doing well or get worse.   This information is not intended to replace advice given to you by your health care provider. Make sure you discuss any questions you have with your health care provider.   Document Released: 04/22/2004 Document Revised: 09/29/2014 Document Reviewed: 12/09/2011 Elsevier Interactive Patient Education [employer].

## 2016-05-05 ENCOUNTER — Encounter (HOSPITAL_COMMUNITY): Payer: Self-pay | Admitting: Internal Medicine

## 2016-05-07 ENCOUNTER — Telehealth (INDEPENDENT_AMBULATORY_CARE_PROVIDER_SITE_OTHER): Payer: Self-pay | Admitting: Internal Medicine

## 2016-05-07 NOTE — Telephone Encounter (Signed)
Patient's spouse, [name] called to report to Dr. Laural Golden that since his EGD last week, he's doing great!  Stated that he's eating whatever he wants and is not having anymore throat issues.  [phone]

## 2016-05-07 NOTE — Telephone Encounter (Signed)
Noted ,and forwarded to Dr.[name].

## 2016-05-07 NOTE — Telephone Encounter (Signed)
glad to know that patient is able to swallow better.

## 2016-05-23 ENCOUNTER — Other Ambulatory Visit (INDEPENDENT_AMBULATORY_CARE_PROVIDER_SITE_OTHER): Payer: Self-pay | Admitting: Internal Medicine

## 2016-07-11 DIAGNOSIS — H40013 Open angle with borderline findings, low risk, bilateral: Secondary | ICD-10-CM | POA: Diagnosis not present

## 2016-07-21 DIAGNOSIS — I951 Orthostatic hypotension: Secondary | ICD-10-CM | POA: Diagnosis not present

## 2016-07-28 DIAGNOSIS — R682 Dry mouth, unspecified: Secondary | ICD-10-CM | POA: Diagnosis not present

## 2016-07-31 ENCOUNTER — Ambulatory Visit: Payer: Medicare Other | Admitting: Cardiology

## 2016-08-12 DIAGNOSIS — H903 Sensorineural hearing loss, bilateral: Secondary | ICD-10-CM | POA: Diagnosis not present

## 2016-08-12 DIAGNOSIS — H838X3 Other specified diseases of inner ear, bilateral: Secondary | ICD-10-CM | POA: Diagnosis not present

## 2016-08-18 ENCOUNTER — Other Ambulatory Visit (HOSPITAL_COMMUNITY): Payer: Self-pay | Admitting: *Deleted

## 2016-08-19 ENCOUNTER — Ambulatory Visit (HOSPITAL_COMMUNITY)
Admission: RE | Admit: 2016-08-19 | Discharge: 2016-08-19 | Disposition: A | Discharge status: Home / Self Care | Payer: Medicare Other | Source: Ambulatory Visit | Attending: Internal Medicine | Admitting: Internal Medicine

## 2016-08-19 ENCOUNTER — Other Ambulatory Visit (INDEPENDENT_AMBULATORY_CARE_PROVIDER_SITE_OTHER): Payer: Self-pay | Admitting: Internal Medicine

## 2016-08-19 DIAGNOSIS — E2740 Unspecified adrenocortical insufficiency: Secondary | ICD-10-CM | POA: Insufficient documentation

## 2016-08-19 LAB — ACTH STIMULATION, 3 TIME POINTS
CORTISOL 30 MIN: 16.8 ug/dL
CORTISOL 60 MIN: 19.8 ug/dL
CORTISOL BASE: 11.5 ug/dL

## 2016-08-19 MED ORDER — COSYNTROPIN 0.25 MG IJ SOLR
0.2500 mg | Freq: Once | INTRAMUSCULAR | Status: AC
  Administered 2016-08-19: 0.25 mg via INTRAVENOUS
  Filled 2016-08-19: qty 0.25

## 2016-08-19 NOTE — Discharge Instructions (Signed)
Cortisol Cortisol is a hormone that plays an important role in many bodily functions. It helps regulate your blood pressure and maintain a healthy blood sugar (blood glucose) level. It aids digestion by breaking down carbohydrates, fats, and proteins. Cortisol also triggers the "fight or flight" reaction that your body uses to respond to stressful situations. The adrenal glands produce cortisol in response to stimulation from a hormone made by the pituitary gland (adrenocorticotropic hormone or ACTH). Some medicines can impact cortisol levels. Problems with your pituitary gland or adrenal glands can also change the cortisol level in your body. These include:  Hypopituitarism.  Acute adrenal crisis.  A tumor of the adrenal gland. A cortisol test measures your body's cortisol level. This can help your health care provider diagnose certain conditions. For example, a high level of cortisol may be a sign of Cushing syndrome, which results from problems with the adrenal gland, or Cushing disease, which results from problems with the pituitary gland. A low cortisol level could signal Addison disease (adrenal insufficiency). Cortisol tests can be done using a sample of blood, urine, or saliva. Cortisol levels vary throughout the day. To get a clear overall picture, many cortisol tests require samples to be taken at different times. It is common to have the blood test at 8 a.m. (when cortisol is highest) or at 4 p.m. (when it is lower). You may even have this test at midnight when cortisol is lowest. Saliva tests are usually done around midnight. Urine samples might include all the urine you produce in a 24-hour period or only a single sample. How do I prepare for this test? If you are having the saliva cortisol test, you may not be able to eat, drink, or use toothpaste for a certain time period before the test. The urine test does not require any special preparation. Your health care provider will give you  specific instructions about timing and how to collect a sample. If you are having the blood cortisol test, you may need to refrain from vigorous exercise and stop taking certain medicines the day before the test. These include:  Antiseizure medicines.  Androgens.  Estrogen.  Glucocorticoids. What do the results mean? It is your responsibility to obtain your test results. Ask the lab or department performing the test when and how you will get your results. Contact your health care provider to discuss any questions you have about your results. Range of Normal Values  Ranges for normal values vary among different labs and hospitals. You should always check with your health care provider after having lab work or other tests done to discuss whether your values are considered within normal limits. This is especially true with your cortisol level because what is considered a normal level will be different throughout the day.  For the morning blood test, a normal cortisol level ranges from 6 to 23 micrograms per deciliter (mcg/dL).  For the evening blood test, a normal cortisol level ranges between 2 to 14 mcg/dL. Meaning of Results Outside Normal Range Values  A cortisol level that is consistently higher than normal can indicate a few possible problems. These include:  Cushing syndrome. This condition results from the pituitary gland making too much ACTH.  Tumors outside the pituitary gland or on the adrenal gland. A cortisol level that is low may be caused by:  Addison disease. In this condition, the adrenal glands do not produce enough cortisol.  Some medicines, such as chemotherapy and glucocorticoid medicines.  Hypopituitarism. In this condition,   the pituitary gland fails to stimulate the adrenal glands to produce enough cortisol. Talk with your health care provider to discuss your results, treatment options, and if necessary, the need for more tests. Talk with your health care provider if  you have any questions about your results. This information is not intended to replace advice given to you by your health care provider. Make sure you discuss any questions you have with your health care provider. Document Released: 10/02/2004 Document Revised: 05/13/2016 Document Reviewed: 12/07/2013 Elsevier Interactive Patient Education  2017 Elsevier Inc.  

## 2016-09-09 ENCOUNTER — Other Ambulatory Visit: Payer: Self-pay | Admitting: Cardiology

## 2016-09-26 NOTE — Progress Notes (Signed)
Subjective:   George Bradley is seen for follow up CAD and dizziness.  He had coronary artery bypass grafting in 2000. His last catheterization was in 2007 and showed patent saphenous vein grafts x3 with a patent left internal mammary artery and well-preserved LV function. His EF is 55%. His last nuclear stress test in 2/16 showed a small fixed apical defect with normal ejection fraction. No ischemia. He has a history of orthostatic hypotension and has persistent lightheadedness. He does note that increase salt intake helps. He is on no antihypertensive therapy. He's had a history of what sounds like a Maze procedure when he lived in [place]. His other problems include hyperlipidemia, insomnia, oral cancer, and a history of depression. 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 He has had esophageal dilitation several times in the past. He does have a chronically painful median sternotomy scar. He went through PT with cold laser treatment and desensitization therapy with some improvement.  On followup today he reports continued orthostatic dizziness.  He manages with this OK but finds it very frustrating. Doesn't like to drink a lot because it causes frequent urination.  He actually feels better with exercise.  No syncope.  Last week he developed severe left arm pain and numbness while in bed. States it felt similar to prior angina but he just took 2 sleeping pills and it had resolved by morning.   Current Outpatient Prescriptions  Medication Sig Dispense Refill  . aspirin EC 81 MG tablet Take 1 tablet (81 mg total) by mouth at bedtime.    . celecoxib (CELEBREX) 100 MG capsule Take 1 capsule by mouth daily as needed.  0  . gabapentin (NEURONTIN) 100 MG capsule Take 100-200 mg by mouth 3 (three) times daily. Takes 1 capsule twice daily and 2 capsules at bedtime    . levothyroxine (SYNTHROID, LEVOTHROID) 100 MCG tablet TAKE ONE TABLET BY MOUTH ONCE DAILY BEFORE   BREAKFAST 90 tablet 0  . LORazepam (ATIVAN) 1 MG tablet TAKE 1 TABLET BY MOUTH THREE TIMES DAILY 270 tablet 0  . pantoprazole (PROTONIX) 40 MG tablet Take 1 tablet (40 mg total) by mouth daily before breakfast. 30 tablet 5  . pilocarpine (SALAGEN) 7.5 MG tablet TAKE 1 TABLET BY MOUTH THREE TIMES DAILY 270 tablet 3  . polyethylene glycol (MIRALAX / GLYCOLAX) packet Take 17 g by mouth daily as needed.    . rosuvastatin (CRESTOR) 20 MG tablet TAKE 1 TABLET BY MOUTH EVERY NIGHT AT BEDTIME 90 tablet 0  . vitamin B-12 (CYANOCOBALAMIN) 1000 MCG tablet Take 1,000 mcg by mouth daily.    Marland Kitchen zolpidem (AMBIEN) 10 MG tablet Take 10 mg by mouth at bedtime as needed for sleep. Reported on 03/26/2016     No current facility-administered medications for this visit.     No Known Allergies  Patient Active Problem List  Diagnosis  . Ischemic heart disease  . Hypercholesterolemia  . Aortic valve stenosis, mild  . Renal artery stenosis, native, bilateral ([place])  . Thrombosis of mesenteric vein  . Depression  . Cancer of base of tongue ([place])  . Orthostatic hypotension  . Hypothyroid  . S/P radiation therapy  . Xerostomia  . Thrombocytopenia ([place])  . Constipation  . Dysphagia  . Neuropathy due to chemotherapeutic drug ([place])    History  Smoking Status  . Former Smoker  . Types: Cigars  . Quit date: 09/21/2009  Smokeless Tobacco  . Former Systems developer  . Quit date:  05/06/2011    Comment: 2 cigars a week    History  Alcohol Use No    Comment: occasionally glass of wine    Family History  Problem Relation Age of Onset  . Heart disease Father   . Heart disease Brother   . Heart disease Sister   . Cancer Sister     breast cancer  . Cancer Mother     breast ca  . Hyperlipidemia Son     Review of Systems:   As noted in history of present illness.  All other systems were reviewed and are negative.   Physical Exam:    BP 108/72 (BP Location: Left Arm, Patient Position: Sitting, Cuff Size: Normal)    Ht 6\' 1"  (1.854 m)   Wt 187 lb (84.8 kg)   BMI 24.67 kg/m     The head is normocephalic and atraumatic.  Pupils are equally round and reactive to light.  Sclerae nonicteric.  Conjunctiva is clear.  Oropharynx is clear.   Neck is supple there are no masses.  Thyroid is not enlarged.  There is no lymphadenopathy.  Lungs are clear.  Chest is symmetric.  He has some keloid formation at sternal scar. Heart shows a regular rate and rhythm.  S1 and S2 are normal.  There is a soft systolic outflow murmur.  Abdomen is soft normal bowel sounds. There is no organomegaly.   Extremities are without edema.  Peripheral pulses are adequate.  Neurologically intact.  Skin is warm and dry.  Laboratory data:  Lab Results  Component Value Date   WBC 6.0 12/28/2015   HGB 14.4 12/28/2015   HCT 43.3 12/28/2015   PLT 141 12/28/2015   GLUCOSE 98 12/28/2015   CHOL 161 02/20/2011   TRIG 101.0 02/20/2011   HDL 51.60 02/20/2011   LDLCALC 89 02/20/2011   ALT 31 12/28/2015   AST 33 12/28/2015   NA 141 12/28/2015   K 4.5 12/28/2015   CL 106 10/13/2014   CREATININE 1.4 (H) 12/28/2015   BUN 16.5 12/28/2015   CO2 26 12/28/2015   TSH 2.214 12/28/2015   INR 1.07 11/11/2011     Labs from Dr. Virgina Jock dated 07/21/16: BUN 24, creatinine 1.5. Other CMET normal.  Dated 01/15/16: cholesterol 185, triglycerides 95, LDL 122, HDL 44.  Assessment / Plan:  1. Orthostatic hypotension. This has been a chronic issue but symptoms have increased.  I have recommended conservative measures. Liberalize sodium in diet. Maintain good hydration. Use support stockings. Use contraction of major muscle groups prior to standing.  Should avoid being supine during the day with elevated BP while supine. On no BP lowering drugs. We discussed medical therapy with Florinef or midodrine but I would not recommend at this point. With his prostate issues he should avoid any alpha blocking drugs.  2.  Coronary disease status post CABG. ? Recent episode  of angina. Will update a stress myoview now. Continue risk factor modification. On ASA and statin. Continue  aerobic exercise.  3. 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 Currently cancer free.  4. Hyperlipidemia. LDL still elevated 122 on 20 mg crestor. Discussed increasing crestor more or adding Zetia. We decided to start Zetia 10 mg daily. He will have follow up lab work with Dr. Virgina Jock.  5. History of RAS - now normotensive.  6. Esophageal disease post radiation. Still requiring periodic dilation.   7. Bifascicular block- chronic and asymptomatic.   I will follow up in 6 months.

## 2016-09-29 ENCOUNTER — Ambulatory Visit (INDEPENDENT_AMBULATORY_CARE_PROVIDER_SITE_OTHER): Payer: Medicare Other | Admitting: Cardiology

## 2016-09-29 ENCOUNTER — Encounter: Payer: Self-pay | Admitting: Cardiology

## 2016-09-29 VITALS — BP 108/72 | Ht 73.0 in | Wt 187.0 lb

## 2016-09-29 DIAGNOSIS — I350 Nonrheumatic aortic (valve) stenosis: Secondary | ICD-10-CM

## 2016-09-29 DIAGNOSIS — I951 Orthostatic hypotension: Secondary | ICD-10-CM | POA: Diagnosis not present

## 2016-09-29 DIAGNOSIS — E7800 Pure hypercholesterolemia, unspecified: Secondary | ICD-10-CM | POA: Diagnosis not present

## 2016-09-29 DIAGNOSIS — I259 Chronic ischemic heart disease, unspecified: Secondary | ICD-10-CM | POA: Diagnosis not present

## 2016-09-29 MED ORDER — EZETIMIBE 10 MG PO TABS: 10.0000 mg | ORAL_TABLET | Freq: Every day | ORAL | 3 refills | Status: DC

## 2016-09-29 NOTE — Patient Instructions (Signed)
Continue your current therapy  Stay well hydrated and liberal with salt intake.  We will add Zetia 10 mg daily for your cholesterol.  We will schedule you for a Nuclear stress test

## 2016-10-01 ENCOUNTER — Telehealth (HOSPITAL_COMMUNITY): Payer: Self-pay

## 2016-10-01 NOTE — Telephone Encounter (Signed)
Encounter complete. 

## 2016-10-03 ENCOUNTER — Ambulatory Visit (HOSPITAL_COMMUNITY)
Admission: RE | Admit: 2016-10-03 | Discharge: 2016-10-03 | Disposition: A | Discharge status: Home / Self Care | Payer: Medicare Other | Source: Ambulatory Visit | Attending: Internal Medicine | Admitting: Internal Medicine

## 2016-10-03 DIAGNOSIS — I501 Left ventricular failure: Secondary | ICD-10-CM | POA: Diagnosis not present

## 2016-10-03 DIAGNOSIS — I350 Nonrheumatic aortic (valve) stenosis: Secondary | ICD-10-CM

## 2016-10-03 DIAGNOSIS — I951 Orthostatic hypotension: Secondary | ICD-10-CM | POA: Diagnosis not present

## 2016-10-03 DIAGNOSIS — I252 Old myocardial infarction: Secondary | ICD-10-CM | POA: Diagnosis not present

## 2016-10-03 DIAGNOSIS — E7800 Pure hypercholesterolemia, unspecified: Secondary | ICD-10-CM | POA: Diagnosis not present

## 2016-10-03 DIAGNOSIS — I259 Chronic ischemic heart disease, unspecified: Secondary | ICD-10-CM | POA: Diagnosis not present

## 2016-10-03 LAB — MYOCARDIAL PERFUSION IMAGING
Estimated workload: 11.4 METS
Exercise duration (min): 9 min
Exercise duration (sec): 50 s
LV dias vol: 119 mL (ref 62–150)
LV sys vol: 56 mL
MPHR: 145 {beats}/min
Peak HR: 136 {beats}/min
Percent HR: 93 %
RPE: 17
Rest HR: 54 {beats}/min
SDS: 0
SRS: 5
SSS: 5
TID: 0.89

## 2016-10-03 MED ORDER — TECHNETIUM TC 99M TETROFOSMIN IV KIT
10.7000 | PACK | Freq: Once | INTRAVENOUS | Status: AC | PRN
  Administered 2016-10-03: 10.7 via INTRAVENOUS
  Filled 2016-10-03: qty 11

## 2016-10-03 MED ORDER — TECHNETIUM TC 99M TETROFOSMIN IV KIT
31.1000 | PACK | Freq: Once | INTRAVENOUS | Status: AC | PRN
  Administered 2016-10-03: 31.1 via INTRAVENOUS
  Filled 2016-10-03: qty 32

## 2016-10-06 ENCOUNTER — Telehealth: Payer: Self-pay | Admitting: Cardiology

## 2016-10-06 NOTE — Telephone Encounter (Signed)
Pt returning your call, is at home now-pls call

## 2016-10-06 NOTE — Telephone Encounter (Signed)
Returned call to patient myoview results given. 

## 2016-10-23 ENCOUNTER — Telehealth: Payer: Self-pay | Admitting: Cardiology

## 2016-10-23 DIAGNOSIS — I259 Chronic ischemic heart disease, unspecified: Secondary | ICD-10-CM

## 2016-10-23 DIAGNOSIS — I951 Orthostatic hypotension: Secondary | ICD-10-CM

## 2016-10-23 MED ORDER — MIDODRINE HCL 10 MG PO TABS: 10.0000 mg | ORAL_TABLET | Freq: Three times a day (TID) | ORAL | 6 refills | Status: DC

## 2016-10-23 NOTE — Telephone Encounter (Signed)
Spoke to wife . Aware we  need to defer to Dr Martinique for information will contact when information is given

## 2016-10-23 NOTE — Telephone Encounter (Signed)
George Bradley is calling to see if  George Bradley can get a prescription for Compression Stocking the knee high . Dr. Martinique also suggested a drug that George Bradley could take for the dizziness and wants to know if they can reconsidered that. Please call

## 2016-10-23 NOTE — Telephone Encounter (Signed)
Returned call to patient.Dr.Jordan's recommendations given.Knee hi compression stockings 15 to 20 compression faxed to [facility] at [phone].Midodrine prescription sent to [facility] in [place].Advised to call back if needed.

## 2016-10-23 NOTE — Telephone Encounter (Signed)
OK to prescribe compression hose with moderate support. If he wants to try medication we can try midodrine 10 mg tid to see if this will help with his orthostasis  Mayda Shippee Martinique MD, [facility] [address]

## 2016-10-27 ENCOUNTER — Other Ambulatory Visit (INDEPENDENT_AMBULATORY_CARE_PROVIDER_SITE_OTHER): Payer: Self-pay | Admitting: Internal Medicine

## 2016-10-27 DIAGNOSIS — R5383 Other fatigue: Secondary | ICD-10-CM | POA: Diagnosis not present

## 2016-10-27 DIAGNOSIS — H6690 Otitis media, unspecified, unspecified ear: Secondary | ICD-10-CM | POA: Diagnosis not present

## 2016-10-27 DIAGNOSIS — Z6824 Body mass index (BMI) 24.0-24.9, adult: Secondary | ICD-10-CM | POA: Diagnosis not present

## 2016-10-27 DIAGNOSIS — J302 Other seasonal allergic rhinitis: Secondary | ICD-10-CM | POA: Diagnosis not present

## 2016-11-20 ENCOUNTER — Other Ambulatory Visit (INDEPENDENT_AMBULATORY_CARE_PROVIDER_SITE_OTHER): Payer: Self-pay | Admitting: Internal Medicine

## 2016-11-26 DIAGNOSIS — R3912 Poor urinary stream: Secondary | ICD-10-CM | POA: Diagnosis not present

## 2016-11-26 DIAGNOSIS — R3915 Urgency of urination: Secondary | ICD-10-CM | POA: Diagnosis not present

## 2016-11-26 DIAGNOSIS — R350 Frequency of micturition: Secondary | ICD-10-CM | POA: Diagnosis not present

## 2016-11-26 DIAGNOSIS — N401 Enlarged prostate with lower urinary tract symptoms: Secondary | ICD-10-CM | POA: Diagnosis not present

## 2016-11-26 DIAGNOSIS — N5201 Erectile dysfunction due to arterial insufficiency: Secondary | ICD-10-CM | POA: Diagnosis not present

## 2016-12-01 DIAGNOSIS — N5201 Erectile dysfunction due to arterial insufficiency: Secondary | ICD-10-CM | POA: Diagnosis not present

## 2016-12-01 DIAGNOSIS — R3912 Poor urinary stream: Secondary | ICD-10-CM | POA: Diagnosis not present

## 2016-12-01 DIAGNOSIS — N401 Enlarged prostate with lower urinary tract symptoms: Secondary | ICD-10-CM | POA: Diagnosis not present

## 2016-12-01 DIAGNOSIS — E291 Testicular hypofunction: Secondary | ICD-10-CM | POA: Diagnosis not present

## 2016-12-01 DIAGNOSIS — R3915 Urgency of urination: Secondary | ICD-10-CM | POA: Diagnosis not present

## 2016-12-04 ENCOUNTER — Telehealth: Payer: Self-pay | Admitting: Cardiology

## 2016-12-04 NOTE — Telephone Encounter (Signed)
Forward/defferred to Dr  Peter Martinique

## 2016-12-04 NOTE — Telephone Encounter (Signed)
He is cleared for urologic surgery. May hold ASA 5 days prior.  Peter Martinique MD, [facility]

## 2016-12-04 NOTE — Telephone Encounter (Signed)
New message    Request for surgical clearance:  1. What type of surgery is being performed?  TUIP  2. When is this surgery scheduled? Not schedule   3. Are there any medications that need to be held prior to surgery and how long Asprin   4. Name of physician performing surgery?  Dr Jeffie Pollock   5. What is your office phone and fax number? Fax [phone]

## 2016-12-05 NOTE — Telephone Encounter (Signed)
Spoke to patient's wife Dr.Jordan advised ok to have upcoming surgery,may hold aspirin 5 days prior to surgery.Note faxed to Dr.Wrenn at fax # [phone].

## 2016-12-10 ENCOUNTER — Telehealth: Payer: Self-pay | Admitting: Cardiology

## 2016-12-10 ENCOUNTER — Other Ambulatory Visit: Payer: Self-pay | Admitting: Urology

## 2016-12-10 MED ORDER — ROSUVASTATIN CALCIUM 40 MG PO TABS: 40.0000 mg | ORAL_TABLET | Freq: Every day | ORAL | 3 refills | Status: DC

## 2016-12-10 NOTE — Telephone Encounter (Signed)
Pt wife notified new rx sent to pharmacy

## 2016-12-10 NOTE — Telephone Encounter (Signed)
New Message     Does Dr Martinique want to double the Crestor since pt has stopped taking the Zitia?  If so they would need a prescription called in to [facility] in [place] on [address]  Call in a 90 day supply if possible

## 2016-12-10 NOTE — Telephone Encounter (Signed)
Patient stopped zetia 3 weeks ago due to having increased muscles aches. Please advise if patient should increase dosage of crestor.

## 2016-12-10 NOTE — Telephone Encounter (Signed)
Would increase crestor to 40 mg daily  Marvelous Bouwens Martinique MD, [facility]

## 2016-12-11 ENCOUNTER — Other Ambulatory Visit: Payer: Self-pay | Admitting: Cardiology

## 2016-12-11 MED ORDER — ROSUVASTATIN CALCIUM 40 MG PO TABS: 40.0000 mg | ORAL_TABLET | Freq: Every day | ORAL | 3 refills | Status: DC

## 2016-12-11 NOTE — Telephone Encounter (Signed)
Rx(s) sent to pharmacy electronically.  

## 2016-12-11 NOTE — Telephone Encounter (Signed)
°*  STAT* If patient is at the pharmacy, call can be transferred to refill team.   1. Which medications need to be refilled? (please list name of each medication and dose if known) Crestor, 40 MG  2. Which pharmacy/location (including street and city if local pharmacy) is medication to be sent to? [facility] [place]. [name]  3. Do they need a 30 day or 90 day supply?

## 2016-12-22 ENCOUNTER — Encounter (HOSPITAL_BASED_OUTPATIENT_CLINIC_OR_DEPARTMENT_OTHER): Payer: Self-pay | Admitting: *Deleted

## 2016-12-22 NOTE — Progress Notes (Signed)
NPO AFTER MN.  ARRIVE AT 0715.  NEED ISTAT AND EKG.  WILL TAKE AM MEDS W/ SIPS OF WATER DOS.

## 2016-12-26 DIAGNOSIS — H25013 Cortical age-related cataract, bilateral: Secondary | ICD-10-CM | POA: Diagnosis not present

## 2016-12-26 DIAGNOSIS — H40013 Open angle with borderline findings, low risk, bilateral: Secondary | ICD-10-CM | POA: Diagnosis not present

## 2016-12-26 DIAGNOSIS — H35033 Hypertensive retinopathy, bilateral: Secondary | ICD-10-CM | POA: Diagnosis not present

## 2016-12-26 DIAGNOSIS — H2513 Age-related nuclear cataract, bilateral: Secondary | ICD-10-CM | POA: Diagnosis not present

## 2016-12-29 ENCOUNTER — Encounter: Payer: Medicare Other | Admitting: Adult Health

## 2016-12-29 NOTE — H&P (Signed)
CC: AUA Questions Scoring.  HPI: George Bradley is a 76 year-old male established patient who is here AUA Questions.      CC/HPI: Frequency, Nocturia and Urgency     George Bradley returns today in f/u for a flowrate and cystoscopy for evaluation of his voiding symptoms. He has a 3-4 month history of frequency up to 20 minutes apart and urgency. He has a reduced stream with intermittency.He can't have an alpha blocker because of orthostatic hypotension.     AUA Symptom Score: Less than 50% of the time he has the sensation of not emptying his bladder completely when finished urinating. 50% of the time he has to urinate again fewer than two hours after he has finished urinating. More than 50% of the time he has to start and stop again several times when he urinates. Almost always he finds it difficult to postpone urination. Almost always he has a weak urinary stream. Less than 50% of the time he has to push or strain to begin urination. He has to get up to urinate 1 time from the time he goes to bed until the time he gets up in the morning.   Calculated AUA Symptom Score: 22    ALLERGIES: None   MEDICATIONS: Aspirin Ec 81 mg tablet, delayed release  Gabapentin 100 mg capsule  Levothyroxine Sodium 100 mcg tablet 1 tablet PO Daily  Lorazepam 1 mg tablet  Midodrine Hcl 10 mg tablet  Pantoprazole Sodium 40 mg tablet, delayed release  Pilocarpine Hcl 7.5 mg tablet  Rosuvastatin Calcium  Zolpidem Tartrate 10 mg tablet     GU PSH: None     PSH Notes: He had chemoradiation for throat cancer in 2015.   He had a procedure on the stomach prior to that may have been a fundoplication.    NON-GU PSH: CABG (coronary artery bypass grafting) - about 2000 Heart Surgery (Unspecified) Throat Surgery (Unspecified)    GU PMH: Atrophy of testis, Bilateral, severe atrophy with reduced libido. - 11/26/2016 BPH w/LUTS, He has BPH with BOO with frequency and urgency his main complaint. he can't have alpha  blockers and I am reluctant to start an OAB med because of his weak stream. I am going to get a PSA and testosterone today ( He has marked testicular atrophy and recent onset ED) and will have him return next week for a flowrate and possible cystoscopy. - 11/26/2016 ED, arterial insufficiency, recent onset severe. - 11/26/2016 Urinary Frequency - 11/26/2016 Urinary Urgency - 11/26/2016 Weak Urinary Stream - 11/26/2016      PMH Notes: throat cancer     NON-GU PMH: Arthritis Cardiac murmur, unspecified GERD Orthostatic hypotension    FAMILY HISTORY: blood clots - Father Cancer - Mother   SOCIAL HISTORY: Marital Status: Married Current Smoking Status: Patient has never smoked.   Tobacco Use Assessment Completed: Used Tobacco in last 30 days? Drinks 2 caffeinated drinks per day.    REVIEW OF SYSTEMS:    GU Review Male:   Patient reports frequent urination, hard to postpone urination, burning/ pain with urination, get up at night to urinate, stream starts and stops, and trouble starting your stream. Patient denies leakage of urine, have to strain to urinate , erection problems, and penile pain.  Gastrointestinal (Upper):   Patient denies nausea, vomiting, and indigestion/ heartburn.  Gastrointestinal (Lower):   Patient reports constipation. Patient denies diarrhea.  Constitutional:   Patient denies fever, night sweats, weight loss, and fatigue.  Skin:   Patient denies  skin rash/ lesion and itching.  Eyes:   Patient denies blurred vision and double vision.  Ears/ Nose/ Throat:   Patient denies sore throat and sinus problems.  Hematologic/Lymphatic:   Patient denies swollen glands and easy bruising.  Cardiovascular:   Patient denies leg swelling and chest pains.  Respiratory:   Patient denies cough and shortness of breath.  Endocrine:   Patient denies excessive thirst.  Musculoskeletal:   Patient denies back pain and joint pain.  Neurological:   Patient reports dizziness. Patient denies  headaches.  Psychologic:   Patient denies depression and anxiety.   VITAL SIGNS:      12/04/2016 12:29 PM  BP 167/77 mmHg  Pulse 54 /min  Temperature 97.7 F / 36 C   MULTI-SYSTEM PHYSICAL EXAMINATION:    Constitutional: Well-nourished. No physical deformities. Normally developed. Good grooming.  Respiratory: No labored breathing, no use of accessory muscles. CTA  Cardiovascular: Normal temperature, RRR with SEM     PAST DATA REVIEWED:  Source Of History:  Patient   11/26/16  PSA  Total PSA 0.94 ng/dl    11/26/16  Hormones  Testosterone, Total 290.0 pg/dL    PROCEDURES:         Flexible Cystoscopy - 52000  Risks, benefits, and some of the potential complications of the procedure were discussed. Cipro 500mg  given for antibiotic prophylaxis.     Meatus:  Normal size. Normal location. Normal condition.  Urethra:  No strictures.  External Sphincter:  Normal.  Verumontanum:  Normal.  Prostate:  Obstructing. Mild hyperplasia but increased muscular tone. 1.5cm   Bladder Neck:  Non-obstructing.  Ureteral Orifices:  Normal location. Normal size. Normal shape. Effluxed clear urine.  Bladder:  Mild trabeculation. No tumors. Normal mucosa. No stones.      The procedure was well tolerated and there were no complications.          Flow Rate - 51741  Flow Time: 0:43 min:sec  Voided Volume: 333 cc  Peak Flow Rate: 10 cc/sec  Time of Peak Flow: 0:12 min:sec  Average Flow Rate: 7 cc/sec  Total Void Time: 0:52 min:sec   ASSESSMENT:      ICD-10 Details  1 GU:   BPH w/LUTS - N40.1 Stable - He has a short tight prostate with obstruction and severe LUTS. He is not a candidate for alpha blockers. I discussed surgical options and will get him set up for a TUIP. I reviewed the risks of bleeding, infection, incontinence, stricturing, persistent LUT's, ejaculatory and erectile dysfunction as well as thrombotic events and anesthetic complications. He will be scheduled in the near future.    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 I will address this further at f/u if he has further issues with ED.   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 I did discuss daily Cialis but ED is not his primary concern.    PLAN:           Schedule Return Visit/Planned Activity: Next Available Appointment - Schedule Surgery             Note: Will schedule TUIP          Document Letter(s):  Created for Patient: Clinical Summary

## 2016-12-30 ENCOUNTER — Encounter (HOSPITAL_BASED_OUTPATIENT_CLINIC_OR_DEPARTMENT_OTHER)
Admission: RE | Disposition: A | Discharge status: Home / Self Care | Payer: Self-pay | Source: Ambulatory Visit | Attending: Urology

## 2016-12-30 ENCOUNTER — Encounter (HOSPITAL_BASED_OUTPATIENT_CLINIC_OR_DEPARTMENT_OTHER): Payer: Self-pay | Admitting: *Deleted

## 2016-12-30 ENCOUNTER — Ambulatory Visit (HOSPITAL_BASED_OUTPATIENT_CLINIC_OR_DEPARTMENT_OTHER): Payer: Medicare Other | Admitting: Anesthesiology

## 2016-12-30 ENCOUNTER — Ambulatory Visit (HOSPITAL_BASED_OUTPATIENT_CLINIC_OR_DEPARTMENT_OTHER)
Admission: RE | Admit: 2016-12-30 | Discharge: 2016-12-30 | Disposition: A | Discharge status: Home / Self Care | Payer: Medicare Other | Source: Ambulatory Visit | Attending: Urology | Admitting: Urology

## 2016-12-30 DIAGNOSIS — E291 Testicular hypofunction: Secondary | ICD-10-CM | POA: Insufficient documentation

## 2016-12-30 DIAGNOSIS — I2510 Atherosclerotic heart disease of native coronary artery without angina pectoris: Secondary | ICD-10-CM | POA: Diagnosis not present

## 2016-12-30 DIAGNOSIS — K219 Gastro-esophageal reflux disease without esophagitis: Secondary | ICD-10-CM | POA: Insufficient documentation

## 2016-12-30 DIAGNOSIS — Z85819 Personal history of malignant neoplasm of unspecified site of lip, oral cavity, and pharynx: Secondary | ICD-10-CM | POA: Diagnosis not present

## 2016-12-30 DIAGNOSIS — Z951 Presence of aortocoronary bypass graft: Secondary | ICD-10-CM | POA: Insufficient documentation

## 2016-12-30 DIAGNOSIS — Z79899 Other long term (current) drug therapy: Secondary | ICD-10-CM | POA: Diagnosis not present

## 2016-12-30 DIAGNOSIS — R3912 Poor urinary stream: Secondary | ICD-10-CM | POA: Diagnosis not present

## 2016-12-30 DIAGNOSIS — N5201 Erectile dysfunction due to arterial insufficiency: Secondary | ICD-10-CM | POA: Insufficient documentation

## 2016-12-30 DIAGNOSIS — I25810 Atherosclerosis of coronary artery bypass graft(s) without angina pectoris: Secondary | ICD-10-CM | POA: Diagnosis not present

## 2016-12-30 DIAGNOSIS — E039 Hypothyroidism, unspecified: Secondary | ICD-10-CM | POA: Diagnosis not present

## 2016-12-30 DIAGNOSIS — N320 Bladder-neck obstruction: Secondary | ICD-10-CM | POA: Diagnosis not present

## 2016-12-30 DIAGNOSIS — N138 Other obstructive and reflux uropathy: Secondary | ICD-10-CM | POA: Insufficient documentation

## 2016-12-30 DIAGNOSIS — R3915 Urgency of urination: Secondary | ICD-10-CM | POA: Insufficient documentation

## 2016-12-30 DIAGNOSIS — N401 Enlarged prostate with lower urinary tract symptoms: Secondary | ICD-10-CM | POA: Diagnosis not present

## 2016-12-30 DIAGNOSIS — Z7982 Long term (current) use of aspirin: Secondary | ICD-10-CM | POA: Diagnosis not present

## 2016-12-30 DIAGNOSIS — R350 Frequency of micturition: Secondary | ICD-10-CM | POA: Diagnosis present

## 2016-12-30 HISTORY — DX: Unspecified osteoarthritis, unspecified site: M19.90

## 2016-12-30 HISTORY — DX: Personal history of other venous thrombosis and embolism: Z86.718

## 2016-12-30 HISTORY — DX: Esophageal obstruction: K22.2

## 2016-12-30 HISTORY — DX: Bladder-neck obstruction: N32.0

## 2016-12-30 HISTORY — DX: Poor urinary stream: R39.12

## 2016-12-30 HISTORY — DX: Atrioventricular block, first degree: I44.0

## 2016-12-30 HISTORY — DX: Postprocedural hypothyroidism: E89.0

## 2016-12-30 HISTORY — DX: Urgency of urination: R39.15

## 2016-12-30 HISTORY — DX: Gastro-esophageal reflux disease without esophagitis: K21.9

## 2016-12-30 HISTORY — DX: Personal history of malignant neoplasm of unspecified site of lip, oral cavity, and pharynx: Z85.819

## 2016-12-30 HISTORY — DX: Disturbances of salivary secretion: K11.7

## 2016-12-30 HISTORY — DX: Personal history of other (healed) physical injury and trauma: Z87.828

## 2016-12-30 HISTORY — DX: Benign prostatic hyperplasia without lower urinary tract symptoms: N40.0

## 2016-12-30 HISTORY — DX: Bifascicular block: I45.2

## 2016-12-30 HISTORY — DX: Orthostatic hypotension: I95.1

## 2016-12-30 HISTORY — DX: Cardiac murmur, unspecified: R01.1

## 2016-12-30 HISTORY — DX: Hesitancy of micturition: R39.11

## 2016-12-30 HISTORY — PX: TRANSURETHRAL INCISION OF PROSTATE: SHX2573

## 2016-12-30 HISTORY — DX: Atherosclerosis of renal artery: I70.1

## 2016-12-30 HISTORY — DX: Presence of external hearing-aid: Z97.4

## 2016-12-30 HISTORY — DX: Radiological procedure and radiotherapy as the cause of abnormal reaction of the patient, or of later complication, without mention of misadventure at the time of the procedure: Y84.2

## 2016-12-30 LAB — POCT I-STAT, CHEM 8
BUN: 27 mg/dL — ABNORMAL HIGH (ref 6–20)
CALCIUM ION: 1.31 mmol/L (ref 1.15–1.40)
Chloride: 106 mmol/L (ref 101–111)
Creatinine, Ser: 1.4 mg/dL — ABNORMAL HIGH (ref 0.61–1.24)
GLUCOSE: 104 mg/dL — AB (ref 65–99)
HCT: 42 % (ref 39.0–52.0)
HEMOGLOBIN: 14.3 g/dL (ref 13.0–17.0)
POTASSIUM: 4.9 mmol/L (ref 3.5–5.1)
Sodium: 145 mmol/L (ref 135–145)
TCO2: 29 mmol/L (ref 0–100)

## 2016-12-30 SURGERY — INCISION, PROSTATE, TRANSURETHRAL
Anesthesia: General | Site: Bladder

## 2016-12-30 MED ORDER — LIDOCAINE 2% (20 MG/ML) 5 ML SYRINGE
INTRAMUSCULAR | Status: AC
Start: 2016-12-30 — End: 2016-12-30
  Filled 2016-12-30: qty 5

## 2016-12-30 MED ORDER — TRAMADOL-ACETAMINOPHEN 37.5-325 MG PO TABS: 1.0000 | ORAL_TABLET | Freq: Four times a day (QID) | ORAL | 0 refills | Status: DC | PRN

## 2016-12-30 MED ORDER — PROPOFOL 10 MG/ML IV BOLUS
INTRAVENOUS | Status: DC | PRN
  Administered 2016-12-30 (×2): 100 mg via INTRAVENOUS

## 2016-12-30 MED ORDER — PROPOFOL 10 MG/ML IV BOLUS
INTRAVENOUS | Status: AC
  Filled 2016-12-30: qty 20

## 2016-12-30 MED ORDER — FENTANYL CITRATE (PF) 100 MCG/2ML IJ SOLN
25.0000 ug | INTRAMUSCULAR | Status: DC | PRN
  Filled 2016-12-30: qty 1

## 2016-12-30 MED ORDER — CEFAZOLIN SODIUM-DEXTROSE 2-4 GM/100ML-% IV SOLN
2.0000 g | INTRAVENOUS | Status: AC
  Administered 2016-12-30: 2 g via INTRAVENOUS
  Filled 2016-12-30: qty 100

## 2016-12-30 MED ORDER — LACTATED RINGERS IV SOLN
INTRAVENOUS | Status: DC
  Administered 2016-12-30: 08:00:00 via INTRAVENOUS
  Filled 2016-12-30: qty 1000

## 2016-12-30 MED ORDER — SODIUM CHLORIDE 0.9% FLUSH
3.0000 mL | INTRAVENOUS | Status: DC | PRN
  Filled 2016-12-30: qty 3

## 2016-12-30 MED ORDER — SODIUM CHLORIDE 0.9% FLUSH
3.0000 mL | Freq: Two times a day (BID) | INTRAVENOUS | Status: DC
  Filled 2016-12-30: qty 3

## 2016-12-30 MED ORDER — DEXAMETHASONE SODIUM PHOSPHATE 10 MG/ML IJ SOLN
INTRAMUSCULAR | Status: AC
  Filled 2016-12-30: qty 1

## 2016-12-30 MED ORDER — ONDANSETRON HCL 4 MG/2ML IJ SOLN
INTRAMUSCULAR | Status: AC
  Filled 2016-12-30: qty 2

## 2016-12-30 MED ORDER — SODIUM CHLORIDE 0.9 % IV SOLN
250.0000 mL | INTRAVENOUS | Status: DC | PRN
  Filled 2016-12-30: qty 250

## 2016-12-30 MED ORDER — LIDOCAINE 2% (20 MG/ML) 5 ML SYRINGE
INTRAMUSCULAR | Status: DC | PRN
  Administered 2016-12-30: 80 mg via INTRAVENOUS

## 2016-12-30 MED ORDER — ONDANSETRON HCL 4 MG/2ML IJ SOLN
INTRAMUSCULAR | Status: DC | PRN
  Administered 2016-12-30: 4 mg via INTRAVENOUS

## 2016-12-30 MED ORDER — OXYCODONE HCL 5 MG PO TABS
5.0000 mg | ORAL_TABLET | ORAL | Status: DC | PRN
  Administered 2016-12-30: 5 mg via ORAL
  Filled 2016-12-30: qty 2

## 2016-12-30 MED ORDER — FENTANYL CITRATE (PF) 100 MCG/2ML IJ SOLN
INTRAMUSCULAR | Status: AC
  Filled 2016-12-30: qty 2

## 2016-12-30 MED ORDER — KETOROLAC TROMETHAMINE 30 MG/ML IJ SOLN
15.0000 mg | Freq: Once | INTRAMUSCULAR | Status: DC | PRN
Start: 2016-12-30 — End: 2016-12-30
  Filled 2016-12-30: qty 1

## 2016-12-30 MED ORDER — DEXAMETHASONE SODIUM PHOSPHATE 4 MG/ML IJ SOLN
INTRAMUSCULAR | Status: DC | PRN
  Administered 2016-12-30: 10 mg via INTRAVENOUS

## 2016-12-30 MED ORDER — PROMETHAZINE HCL 25 MG/ML IJ SOLN
6.2500 mg | INTRAMUSCULAR | Status: DC | PRN
  Filled 2016-12-30: qty 1

## 2016-12-30 MED ORDER — OXYCODONE HCL 5 MG PO TABS
ORAL_TABLET | ORAL | Status: AC
  Filled 2016-12-30: qty 1

## 2016-12-30 MED ORDER — MORPHINE SULFATE (PF) 2 MG/ML IV SOLN
1.0000 mg | INTRAVENOUS | Status: DC | PRN
  Filled 2016-12-30: qty 0.5

## 2016-12-30 MED ORDER — ACETAMINOPHEN 650 MG RE SUPP
650.0000 mg | RECTAL | Status: DC | PRN
  Filled 2016-12-30: qty 1

## 2016-12-30 MED ORDER — FENTANYL CITRATE (PF) 100 MCG/2ML IJ SOLN
INTRAMUSCULAR | Status: DC | PRN
  Administered 2016-12-30 (×4): 25 ug via INTRAVENOUS

## 2016-12-30 MED ORDER — CEFAZOLIN SODIUM-DEXTROSE 2-4 GM/100ML-% IV SOLN
INTRAVENOUS | Status: AC
  Filled 2016-12-30: qty 100

## 2016-12-30 MED ORDER — ACETAMINOPHEN 325 MG PO TABS
650.0000 mg | ORAL_TABLET | ORAL | Status: DC | PRN
  Filled 2016-12-30: qty 2

## 2016-12-30 SURGICAL SUPPLY — 24 items
BAG DRAIN URO-CYSTO SKYTR STRL (DRAIN) ×3 IMPLANT
BAG DRN ANRFLXCHMBR STRAP LEK (BAG)
BAG DRN UROCATH (DRAIN) ×1
BAG URINE DRAINAGE (UROLOGICAL SUPPLIES) ×3 IMPLANT
BAG URINE LEG 19OZ MD ST LTX (BAG) IMPLANT
CATH FOLEY 2WAY SLVR  5CC 20FR (CATHETERS) ×2
CATH FOLEY 2WAY SLVR  5CC 22FR (CATHETERS)
CATH FOLEY 2WAY SLVR 5CC 20FR (CATHETERS) ×1 IMPLANT
CATH FOLEY 2WAY SLVR 5CC 22FR (CATHETERS) IMPLANT
CLOTH BEACON ORANGE TIMEOUT ST (SAFETY) ×3 IMPLANT
ELECT HOOK LOOP BIPOLAR (NEEDLE) ×3 IMPLANT
ELECT REM PT RETURN 9FT ADLT (ELECTROSURGICAL) ×3
ELECTRODE REM PT RTRN 9FT ADLT (ELECTROSURGICAL) ×1 IMPLANT
EVACUATOR MICROVAS BLADDER (UROLOGICAL SUPPLIES) IMPLANT
GLOVE SURG SS PI 8.0 STRL IVOR (GLOVE) ×3 IMPLANT
GOWN STRL REUS W/ TWL XL LVL3 (GOWN DISPOSABLE) ×1 IMPLANT
GOWN STRL REUS W/TWL XL LVL3 (GOWN DISPOSABLE) ×3
HOLDER FOLEY CATH W/STRAP (MISCELLANEOUS) IMPLANT
KIT RM TURNOVER CYSTO AR (KITS) ×3 IMPLANT
MANIFOLD NEPTUNE II (INSTRUMENTS) ×3 IMPLANT
PACK CYSTO (CUSTOM PROCEDURE TRAY) ×3 IMPLANT
PLUG CATH AND CAP STER (CATHETERS) IMPLANT
TUBE CONNECTING 12'X1/4 (SUCTIONS)
TUBE CONNECTING 12X1/4 (SUCTIONS) IMPLANT

## 2016-12-30 NOTE — Brief Op Note (Signed)
12/30/2016  9:43 AM  PATIENT:  Allegra Lai  76 y.o. 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  PROCEDURE:  Procedure(s): TRANSURETHRAL INCISION OF THE PROSTATE (TUIP) (N/A)  SURGEON:  Surgeon(s) and Role:    * Irine Seal, MD - Primary  PHYSICIAN ASSISTANT:   ASSISTANTS: none   ANESTHESIA:   general  EBL:  Total I/O In: 200 [I.V.:200] Out: -   BLOOD ADMINISTERED:none  DRAINS: Urinary Catheter (Foley)   LOCAL MEDICATIONS USED:  NONE  SPECIMEN:  No Specimen  DISPOSITION OF SPECIMEN:  N/A  COUNTS:  YES  TOURNIQUET:  * No tourniquets in log *  DICTATION: .Other Dictation: Dictation Number [id number]  PLAN OF CARE: Discharge to home after PACU  PATIENT DISPOSITION:  PACU - hemodynamically stable.   Delay start of Pharmacological VTE agent (>24hrs) due to surgical blood loss or risk of bleeding: yes

## 2016-12-30 NOTE — Anesthesia Preprocedure Evaluation (Signed)
Anesthesia Evaluation  Patient identified by MRN, date of birth, ID band Patient awake    Reviewed: Allergy & Precautions, NPO status , Patient's Chart, lab work & pertinent test results  Airway Mallampati: II  TM Distance: >3 FB Neck ROM: Full    Dental no notable dental hx.    Pulmonary neg pulmonary ROS, former smoker,    Pulmonary exam normal breath sounds clear to auscultation       Cardiovascular + CAD and + CABG  Normal cardiovascular exam Rhythm:Regular Rate:Normal     Neuro/Psych negative neurological ROS  negative psych ROS   GI/Hepatic negative GI ROS, Neg liver ROS,   Endo/Other  negative endocrine ROSHypothyroidism   Renal/GU negative Renal ROS  negative genitourinary   Musculoskeletal negative musculoskeletal ROS (+)   Abdominal   Peds negative pediatric ROS (+)  Hematology negative hematology ROS (+)   Anesthesia Other Findings   Reproductive/Obstetrics negative OB ROS                             Anesthesia Physical Anesthesia Plan  ASA: III  Anesthesia Plan: General   Post-op Pain Management:    Induction: Intravenous  Airway Management Planned: LMA  Additional Equipment:   Intra-op Plan:   Post-operative Plan: Extubation in OR  Informed Consent: I have reviewed the patients History and Physical, chart, labs and discussed the procedure including the risks, benefits and alternatives for the proposed anesthesia with the patient or authorized representative who has indicated his/her understanding and acceptance.   Dental advisory given  Plan Discussed with: CRNA and Surgeon  Anesthesia Plan Comments:         Anesthesia Quick Evaluation

## 2016-12-30 NOTE — Anesthesia Procedure Notes (Signed)
Procedure Name: LMA Insertion Date/Time: 12/30/2016 9:19 AM Performed by: Justice Rocher Pre-anesthesia Checklist: Patient identified, Emergency Drugs available, Suction available and Patient being monitored Patient Re-evaluated:Patient Re-evaluated prior to inductionOxygen Delivery Method: Circle system utilized Preoxygenation: Pre-oxygenation with 100% oxygen Intubation Type: IV induction Ventilation: Mask ventilation without difficulty LMA: LMA inserted LMA Size: 5.0 Number of attempts: 1 Airway Equipment and Method: Bite block Placement Confirmation: positive ETCO2 and breath sounds checked- equal and bilateral Tube secured with: Tape Dental Injury: Teeth and Oropharynx as per pre-operative assessment

## 2016-12-30 NOTE — Discharge Instructions (Signed)
Transurethral Incision of the Prostate, Care After Refer to this sheet in the next few weeks. These instructions provide you with information about caring for yourself after your procedure. Your health care provider may also give you more specific instructions. Your treatment has been planned according to current medical practices, but problems sometimes occur. Call your health care provider if you have any problems or questions after your procedure. What can I expect after the procedure? After the procedure, it is common to have:  Mild pain in your lower abdomen.  Soreness or mild discomfort in your penis from having the catheter inserted during the procedure.  A feeling of urgency when you need to urinate.  A small amount of blood in your urine. You may notice some small blood clots in your urine. These are normal. Follow these instructions at home: Medicines    Take over-the-counter and prescription medicines only as told by your health care provider.  Do not drive or operate heavy machinery while taking prescription pain medicine.  Do not drive for 24 hours if you received a sedative.  If you were prescribed antibiotic medicine, take it as told by your health care provider. Do not stop taking the antibiotic even if you start to feel better. Activity   Return to your normal activities as told by your health care provider. Ask your health care provider what activities are safe for you.  Do not lift anything that is heavier than 10 lb (4.5 kg) for 3 weeks after your procedure, or as long as told by your health care provider.  Avoid intense physical activity for as long as told by your health care provider.  Walk at least one time every day. This helps to prevent blood clots. You may increase your physical activity gradually as you start to feel better. Lifestyle   Do not drink alcohol for as long as told by your health care provider. This is especially important if you are taking  prescription pain medicines.  Do not engage in sexual activity until your health care provider says that you can do this. General instructions   Do not take baths, swim, or use a hot tub until your health care provider approves.  Drink enough fluid to keep your urine clear or pale yellow.  Urinate as soon as you feel the need to. Do not try to hold your urine for long periods of time.  If your health care provider approves, you may take a stool softener for 2-3 weeks to prevent you from straining to have a bowel movement.  Wear compression stockings as told by your health care provider. These stockings help to prevent blood clots and reduce swelling in your legs.  Keep all follow-up visits as told by your health care provider. This is important. Contact a health care provider if:  You have difficulty urinating.  You have a fever.  You have pain that gets worse or does not improve with medicine.  You have blood in your urine that does not go away after 1 week of resting and drinking more fluids.  You have swelling in your penis or testicles. Get help right away if:  You are unable to urinate.  You are having more blood clots in your urine instead of fewer.  You have:  Large blood clots.  A lot of blood in your urine.  Pain in your back or lower abdomen.  Pain or swelling in your legs.  Chills and you are shaking.  You may  remove the catheter in the morning and if you don't feel you can do that, please come to the office.    This information is not intended to replace advice given to you by your health care provider. Make sure you discuss any questions you have with your health care provider. Document Released: 09/08/2005 Document Revised: 05/11/2016 Document Reviewed: 05/31/2015 Elsevier Interactive Patient Education  2017 [facility] Instructions  Activity: Get plenty of rest for the remainder of the day. A responsible individual  must stay with you for 24 hours following the procedure.  For the next 24 hours, DO NOT: -Drive a car -Paediatric nurse -Drink alcoholic beverages -Take any medication unless instructed by your physician -Make any legal decisions or sign important papers.  Meals: Start with liquid foods such as gelatin or soup. Progress to regular foods as tolerated. Avoid greasy, spicy, heavy foods. If nausea and/or vomiting occur, drink only clear liquids until the nausea and/or vomiting subsides. Call your physician if vomiting continues.  Special Instructions/Symptoms: Your throat may feel dry or sore from the anesthesia or the breathing tube placed in your throat during surgery. If this causes discomfort, gargle with warm salt water. The discomfort should disappear within 24 hours.  If you had a scopolamine patch placed behind your ear for the management of post- operative nausea and/or vomiting:  1. The medication in the patch is effective for 72 hours, after which it should be removed.  Wrap patch in a tissue and discard in the trash. Wash hands thoroughly with soap and water. 2. You may remove the patch earlier than 72 hours if you experience unpleasant side effects which may include dry mouth, dizziness or visual disturbances. 3. Avoid touching the patch. Wash your hands with soap and water after contact with the patch.

## 2016-12-30 NOTE — Interval H&P Note (Signed)
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 After consideration of risks, benefits and other options for treatment, the patient has consented to  Procedure(s): [place] (TUIP) (N/A) as a surgical intervention .  The patient's history has been reviewed, patient examined, no change in status, stable for surgery.  I have reviewed the patient's chart and labs.  Questions were answered to the patient's satisfaction.     Tarez Bowns J

## 2016-12-30 NOTE — Op Note (Signed)
NAME:  ASUNCION, SHIBATA NO.:  MEDICAL RECORD NO.:  [id number]  LOCATION:                                 FACILITY:  PHYSICIAN:  Marshall Cork. Jeffie Pollock, M.D.         DATE OF BIRTH:  DATE OF PROCEDURE:  12/30/2016 DATE OF DISCHARGE:                              OPERATIVE REPORT   PROCEDURE:  Transurethral incision of the prostate.  PREOPERATIVE DIAGNOSIS:  Benign prostatic hypertrophy with bladder outlet obstruction.  POSTOPERATIVE DIAGNOSIS:  Benign prostatic hypertrophy with bladder outlet obstruction.  SURGEON:  Marshall Cork. Jeffie Pollock, M.D.  ANESTHESIA:  General.  SPECIMEN:  None.  DRAINS:  A 20-French Foley catheter.  BLOOD LOSS:  Minimal.  COMPLICATIONS:  None.  INDICATIONS:  Mr. Lorusso is a 75 year old white male with BPH and bladder outlet obstruction with a short muscular prostate, who has elected to proceed with transurethral incision of prostate.  FINDINGS AND PROCEDURE:  He was taken to the operating room where he was given Ancef.  General anesthetic was induced.  He was placed in lithotomy position and was fitted with PAS hose.  His perineum and genitalia were prepped with Betadine solution and he was draped in usual sterile fashion.  Cystoscopy was performed using the 28-French continuous flow resectoscope sheath with visual obturator.  The urethra was calibrated to 30-French with Van-Buren sounds prior to passage.  Inspection revealed a normal urethra.  The external sphincter was intact.  The prostatic urethra was short with a very tight bladder neck and minimal hyperplasia.  Inspection of bladder revealed ureteral orifices in the normal anatomic position, effluxing clear urine.  The bladder wall had mild-to-moderate trabeculation with a few cellules.  No tumors, stones, or inflammation were noted.  The resectoscope was fitted with an Beatrix Fetters handle with a bipolar [employer].  Saline was used as the irrigant.  A bladder neck incision was  made at 5 o'clock on the left side from just distal to the ureteral orifice to the verumontanum down to the fat at the bladder neck.  This was then repeated on the right side at 7 o'clock.  Hemostasis was achieved with cautery.  Once both incisions were made, final inspection revealed a widely patent bladder neck.  The scope was removed.  Once hemostasis was secured and a 20-French 30 mL Foley was placed. The balloon was filled with 30 mL of sterile fluid.  The catheter was irrigated with clear return and placed to straight drainage.  The patient was taken down from lithotomy position.  His anesthetic was reversed.  He was moved to the recovery room in stable condition.  There were no complications.     Marshall Cork. Jeffie Pollock, M.D.     JJW/MEDQ  D:  12/30/2016  T:  12/30/2016  Job:  [id number]

## 2016-12-30 NOTE — Anesthesia Postprocedure Evaluation (Addendum)
Anesthesia Post Note  Patient: George Bradley  Procedure(s) Performed: Procedure(s) (LRB): TRANSURETHRAL INCISION OF THE PROSTATE (TUIP) (N/A)  Patient location during evaluation: PACU Anesthesia Type: General Level of consciousness: awake and alert Pain management: pain level controlled Vital Signs Assessment: post-procedure vital signs reviewed and stable Respiratory status: spontaneous breathing, nonlabored ventilation, respiratory function stable and patient connected to nasal cannula oxygen Cardiovascular status: blood pressure returned to baseline and stable Postop Assessment: no signs of nausea or vomiting Anesthetic complications: no       Last Vitals:  Vitals:   12/30/16 0954 12/30/16 1000  BP: (!) 170/85 (!) 164/82  Pulse:  68  Resp: (!) 8 10  Temp: 36.5 C     Last Pain:  Vitals:   12/30/16 0717  TempSrc: Oral                 Kenniel Bergsma S

## 2016-12-30 NOTE — Transfer of Care (Signed)
Immediate Anesthesia Transfer of Care Note  Patient: George Bradley  Procedure(s) Performed: Procedure(s) (LRB): TRANSURETHRAL INCISION OF THE PROSTATE (TUIP) (N/A)  Patient Location: PACU  Anesthesia Type: General  Level of Consciousness: awake, sedated, patient cooperative and responds to stimulation  Airway & Oxygen Therapy: Patient Spontanous Breathing and Patient connected to [place] O2  Post-op Assessment: Report given to PACU RN, Post -op Vital signs reviewed and stable and Patient moving all extremities  Post vital signs: Reviewed and stable  Complications: No apparent anesthesia complications

## 2016-12-31 ENCOUNTER — Encounter (HOSPITAL_BASED_OUTPATIENT_CLINIC_OR_DEPARTMENT_OTHER): Payer: Self-pay | Admitting: Urology

## 2017-01-14 DIAGNOSIS — R3912 Poor urinary stream: Secondary | ICD-10-CM | POA: Diagnosis not present

## 2017-01-14 DIAGNOSIS — R350 Frequency of micturition: Secondary | ICD-10-CM | POA: Diagnosis not present

## 2017-01-19 ENCOUNTER — Ambulatory Visit (INDEPENDENT_AMBULATORY_CARE_PROVIDER_SITE_OTHER): Payer: Medicare Other | Admitting: Cardiology

## 2017-01-19 ENCOUNTER — Encounter: Payer: Self-pay | Admitting: Cardiology

## 2017-01-19 VITALS — BP 124/78 | HR 57 | Ht 73.0 in | Wt 185.4 lb

## 2017-01-19 DIAGNOSIS — E038 Other specified hypothyroidism: Secondary | ICD-10-CM | POA: Diagnosis not present

## 2017-01-19 DIAGNOSIS — R42 Dizziness and giddiness: Secondary | ICD-10-CM | POA: Diagnosis not present

## 2017-01-19 DIAGNOSIS — R8299 Other abnormal findings in urine: Secondary | ICD-10-CM | POA: Diagnosis not present

## 2017-01-19 DIAGNOSIS — I259 Chronic ischemic heart disease, unspecified: Secondary | ICD-10-CM | POA: Diagnosis not present

## 2017-01-19 DIAGNOSIS — I350 Nonrheumatic aortic (valve) stenosis: Secondary | ICD-10-CM

## 2017-01-19 DIAGNOSIS — E784 Other hyperlipidemia: Secondary | ICD-10-CM | POA: Diagnosis not present

## 2017-01-19 DIAGNOSIS — Z0000 Encounter for general adult medical examination without abnormal findings: Secondary | ICD-10-CM | POA: Diagnosis not present

## 2017-01-19 DIAGNOSIS — Z125 Encounter for screening for malignant neoplasm of prostate: Secondary | ICD-10-CM | POA: Diagnosis not present
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
## 2017-01-19 NOTE — Patient Instructions (Signed)
Medication Instructions: No changes   Procedures/Testing: Your physician has requested that you have an echocardiogram. Echocardiography is a painless test that uses sound waves to create images of your heart. It provides your doctor with information about the size and shape of your heart and how well your heart's chambers and valves are working. This procedure takes approximately one hour. There are no restrictions for this procedure. This will take place at [place], [address]   Follow-Up: . Your physician recommends that you schedule a follow-up appointment in: July with Dr. Martinique. Please follow up sooner of the echocardiogram is abnormal.   Any Additional Special Instructions Will Be Listed Below (If Applicable). Remember to stay well hydrated. Rise slowly from a sitting or lying position to help avoid dizziness. May increase salt intake a little bit.   Dizziness Dizziness is a common problem. It is a feeling of unsteadiness or light-headedness. You may feel like you are about to faint. Dizziness can lead to injury if you stumble or fall. Anyone can become dizzy, but dizziness is more common in older adults. This condition can be caused by a number of things, including medicines, dehydration, or illness. Follow these instructions at home: Taking these steps may help with your condition: Eating and drinking   Drink enough fluid to keep your urine clear or pale yellow. This helps to keep you from becoming dehydrated. Try to drink more clear fluids, such as water.  Do not drink alcohol.  Limit your caffeine intake if directed by your health care provider.  Limit your salt intake if directed by your health care provider. Activity   Avoid making quick movements.  Rise slowly from chairs and steady yourself until you feel okay.  In the morning, first sit up on the side of the bed. When you feel okay, stand slowly while you hold onto something until you know that your  balance is fine.  Move your legs often if you need to stand in one place for a long time. Tighten and relax your muscles in your legs while you are standing.  Do not drive or operate heavy machinery if you feel dizzy.  Avoid bending down if you feel dizzy. Place items in your home so that they are easy for you to reach without leaning over. Lifestyle   Do not use any tobacco products, including cigarettes, chewing tobacco, or electronic cigarettes. If you need help quitting, ask your health care provider.  Try to reduce your stress level, such as with yoga or meditation. Talk with your health care provider if you need help. General instructions   Watch your dizziness for any changes.  Take medicines only as directed by your health care provider. Talk with your health care provider if you think that your dizziness is caused by a medicine that you are taking.  Tell a friend or a family member that you are feeling dizzy. If he or she notices any changes in your behavior, have this person call your health care provider.  Keep all follow-up visits as directed by your health care provider. This is important. Contact a health care provider if:  Your dizziness does not go away.  Your dizziness or light-headedness gets worse.  You feel nauseous.  You have reduced hearing.  You have new symptoms.  You are unsteady on your feet or you feel like the room is spinning. Get help right away if:  You vomit or have diarrhea and are unable to eat or drink  anything.  You have problems talking, walking, swallowing, or using your arms, hands, or legs.  You feel generally weak.  You are not thinking clearly or you have trouble forming sentences. It may take a friend or family member to notice this.  You have chest pain, abdominal pain, shortness of breath, or sweating.  Your vision changes.  You notice any bleeding.  You have a headache.  You have neck pain or a stiff neck.  You have a  fever. This information is not intended to replace advice given to you by your health care provider. Make sure you discuss any questions you have with your health care provider. Document Released: 03/04/2001 Document Revised: 02/14/2016 Document Reviewed: 09/04/2014 Elsevier Interactive Patient Education  2017 [employer].     If you need a refill on your cardiac medications before your next appointment, please call your pharmacy.

## 2017-02-03 ENCOUNTER — Other Ambulatory Visit: Payer: Self-pay

## 2017-02-03 ENCOUNTER — Ambulatory Visit (HOSPITAL_COMMUNITY): Payer: Medicare Other | Attending: Cardiology

## 2017-02-03 DIAGNOSIS — I352 Nonrheumatic aortic (valve) stenosis with insufficiency: Secondary | ICD-10-CM | POA: Diagnosis not present

## 2017-02-03 DIAGNOSIS — I350 Nonrheumatic aortic (valve) stenosis: Secondary | ICD-10-CM | POA: Diagnosis not present

## 2017-02-03 DIAGNOSIS — I700 Atherosclerosis of aorta: Secondary | ICD-10-CM | POA: Insufficient documentation

## 2017-02-03 DIAGNOSIS — R42 Dizziness and giddiness: Secondary | ICD-10-CM | POA: Insufficient documentation

## 2017-02-04 ENCOUNTER — Other Ambulatory Visit (HOSPITAL_COMMUNITY): Payer: Medicare Other

## 2017-02-10 DIAGNOSIS — H9313 Tinnitus, bilateral: Secondary | ICD-10-CM | POA: Diagnosis not present

## 2017-02-10 DIAGNOSIS — H838X3 Other specified diseases of inner ear, bilateral: Secondary | ICD-10-CM | POA: Diagnosis not present

## 2017-02-10 DIAGNOSIS — H903 Sensorineural hearing loss, bilateral: Secondary | ICD-10-CM | POA: Diagnosis not present

## 2017-02-17 ENCOUNTER — Other Ambulatory Visit (INDEPENDENT_AMBULATORY_CARE_PROVIDER_SITE_OTHER): Payer: Self-pay | Admitting: Internal Medicine

## 2017-02-17 DIAGNOSIS — R1013 Epigastric pain: Principal | ICD-10-CM

## 2017-02-17 DIAGNOSIS — G8929 Other chronic pain: Secondary | ICD-10-CM

## 2017-02-17 NOTE — Telephone Encounter (Signed)
rx for Ativan refilled

## 2017-02-23 NOTE — Addendum Note (Signed)
Addendum  created 02/23/17 1308 by Myrtie Soman, MD   Sign clinical note

## 2017-03-20 ENCOUNTER — Other Ambulatory Visit: Payer: Self-pay | Admitting: Hematology and Oncology

## 2017-04-01 ENCOUNTER — Telehealth: Payer: Self-pay | Admitting: Cardiology

## 2017-04-01 NOTE — Telephone Encounter (Signed)
Returned call to wife (ok per DPR)  Reports patient is experiencing increased SOB in the last few weeks.  Reports mainly with exertion.  Patient also experiencing weakness.  Reports patient cannot walk from car in the garage into the house without being very SOB.  Also plays golf and is unable to complete a hole, he often has to just sit in the cart.   Reports patient is resting now, RR 16 BP 138/80 P 80.   Denies CP, palpitations, swelling, dizziness.  Patient scheduled to see Dr. Martinique tomorrow 7/12 at 1:40pm.   Wife aware and verbalized understanding.

## 2017-04-01 NOTE — Telephone Encounter (Signed)
New message   Pt wife is calling about pt. He just came back from playing golf.  Pt c/o Shortness Of Breath: STAT if SOB developed within the last 24 hours or pt is noticeably SOB on the phone  1. Are you currently SOB (can you hear that pt is SOB on the phone)? Yes   2. How long have you been experiencing SOB? For a couple months, but getting progressively worse.    3. Are you SOB when sitting or when up moving around? Yes. Pt wife said she does not see with sitting down, she mostly notices after moving around.  4. Are you currently experiencing any other symptoms? Weak  bp-138/80 p- 80

## 2017-04-01 NOTE — Progress Notes (Signed)
Subjective:   George Bradley is seen for evaluation of increased SOB/dyspnea.   He had coronary artery bypass grafting in 2000. His last catheterization was in 2007 and showed patent saphenous vein grafts x3 with a patent left internal mammary artery and well-preserved LV function. His EF is 55%. His last nuclear stress test in January 2018 showed a small fixed apical defect with normal ejection fraction. No ischemia. Echo in April showed normal EF and mild AS.  He also has a history of orthostatic hypotension and has persistent lightheadedness. He does note that increase salt intake helps. He has been on no antihypertensive therapy. He's had a history of what sounds like a Maze procedure when he lived in [place]. His other problems include hyperlipidemia, insomnia, oral cancer, and a history of depression. 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 He has had esophageal dilitation several times in the past.  On followup today he reports that for the past 2 months he has progressive fatigue, exercise intolerance, and DOE. He plays golf regularly. He got to where he would have to stop playing after 15 holes, then 9 holes and more recently even one hole was a struggle. No chest pain. Yesterday he notes having to pull off the road driving because he felt so poor. Now notes dizziness most of the time and not just when standing. He has chronic arthritis. Takes Miralax once a week for constipation. S/p TUIP in April. No fever or chills. Notes he has a treadmill at home and he could previously walk 5 miles/day but now can barely walk 1/4 mile.   Current Outpatient Prescriptions  Medication Sig Dispense Refill  . aspirin EC 81 MG tablet Take 1 tablet (81 mg total) by mouth at bedtime.    . celecoxib (CELEBREX) 100 MG capsule Take 1 capsule by mouth daily as needed.  0  . gabapentin (NEURONTIN) 100 MG capsule Take 100-200 mg by mouth 3 (three) times daily. Takes 1 capsule  twice daily and 2 capsules at bedtime    . levothyroxine (SYNTHROID, LEVOTHROID) 100 MCG tablet TAKE ONE TABLET BY MOUTH ONCE DAILY BEFORE  BREAKFAST 90 tablet 0  . LORazepam (ATIVAN) 1 MG tablet TAKE 1 TABLET BY MOUTH THREE TIMES DAILY 270 tablet 0  . midodrine (PROAMATINE) 10 MG tablet Take 1 tablet (10 mg total) by mouth 3 (three) times daily. 90 tablet 6  . pantoprazole (PROTONIX) 40 MG tablet TAKE 1 TABLET BY MOUTH EVERY DAY BEFORE BREAKFAST 30 tablet 5  . pilocarpine (SALAGEN) 7.5 MG tablet TAKE 1 TABLET BY MOUTH THREE TIMES DAILY 270 tablet 3  . polyethylene glycol (MIRALAX / GLYCOLAX) packet Take 17 g by mouth daily as needed.    . rosuvastatin (CRESTOR) 40 MG tablet Take 1 tablet (40 mg total) by mouth at bedtime. 90 tablet 3  . vitamin B-12 (CYANOCOBALAMIN) 1000 MCG tablet Take 1,000 mcg by mouth daily.    Marland Kitchen zolpidem (AMBIEN) 10 MG tablet Take 10 mg by mouth at bedtime as needed for sleep. Reported on 03/26/2016     No current facility-administered medications for this visit.     Allergies  Allergen Reactions  . Zetia [Ezetimibe] Other (See Comments)    "made me feel bad"    Patient Active Problem List   Diagnosis Date Noted  . Constipation 03/23/2014  . Dysphagia 03/23/2014  . 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 Xerostomia 06/10/2013  . Thrombocytopenia ([place]) 06/10/2013  .  S/P radiation therapy   . 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 Orthostatic hypotension 05/11/2012  . Cancer of base of tongue ([place]) 11/17/2011  . Depression 10/01/2011  . Renal artery stenosis, native, bilateral ([place]) 09/03/2011  . 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 Ischemic heart disease 02/20/2011  . Hypercholesterolemia 02/20/2011  . Aortic valve stenosis, mild 02/20/2011    History  Smoking Status  . Former Smoker  . Years: 2.00  . Types: Cigars  . Quit date: 09/21/2009  Smokeless Tobacco  . Former Systems developer    Comment: 2 cigars a week    History  Alcohol Use  . 0.0  oz/week    Comment: rare  wine    Family History  Problem Relation Age of Onset  . Heart disease Father   . Heart disease Brother   . Heart disease Sister   . Cancer Sister        breast cancer  . Cancer Mother        breast ca  . Hyperlipidemia Son     Review of Systems:   As noted in history of present illness.  All other systems were reviewed and are negative.   Physical Exam:    BP 138/82   Pulse (!) 59   Ht 6\' 1"  (1.854 m)   Wt 185 lb (83.9 kg)   BMI 24.41 kg/m     The head is normocephalic and atraumatic.  Pupils are equally round and reactive to light.  Sclerae nonicteric.  Conjunctiva is clear.  Oropharynx is clear.   Neck is supple there are no masses.  Thyroid is not enlarged.  There is no lymphadenopathy.  Lungs are clear.  Chest is symmetric.  He has some keloid formation at sternal scar. Heart shows a regular rate and rhythm.  S1 and S2 are normal.  There is a soft systolic outflow murmur.  Abdomen is soft normal bowel sounds. There is no organomegaly.   Extremities are without edema.  Peripheral pulses are adequate.  Neurologically intact.  Skin is warm and dry.  Laboratory data:  Lab Results  Component Value Date   WBC 6.0 12/28/2015   HGB 14.3 12/30/2016   HCT 42.0 12/30/2016   PLT 141 12/28/2015   GLUCOSE 104 (H) 12/30/2016   CHOL 161 02/20/2011   TRIG 101.0 02/20/2011   HDL 51.60 02/20/2011   LDLCALC 89 02/20/2011   ALT 31 12/28/2015   AST 33 12/28/2015   NA 145 12/30/2016   K 4.9 12/30/2016   CL 106 12/30/2016   CREATININE 1.40 (H) 12/30/2016   BUN 27 (H) 12/30/2016   CO2 26 12/28/2015   TSH 2.214 12/28/2015   INR 1.07 11/11/2011     Labs from Dr. Virgina Jock dated 07/21/16: BUN 24, creatinine 1.5. Other CMET normal.  Dated 01/15/16: cholesterol 185, triglycerides 95, LDL 122, HDL 44.   Echo dated 02/03/17: Study Conclusions  - Left ventricle: The cavity size was normal. Wall thickness was   increased in a pattern of moderate LVH. Systolic  function was   normal. The estimated ejection fraction was in the range of 55%   to 60%. Wall motion was normal; there were no regional wall   motion abnormalities. Doppler parameters are consistent with   abnormal left ventricular relaxation (grade 1 diastolic   dysfunction). - Aortic valve: Valve mobility was restricted. There was mild   stenosis. There was mild regurgitation. Valve area (VTI): 1.53   cm^2. Valve area (Vmax): 1.47 cm^2. Valve area (Vmean): 1.48  cm^2.  Impressions:  - Normal LV systolic function; moderate LVH; mild diastolic   dysfunction; calcified aortic valve with mild AS (mean gradient   13 mmHg); mild AI; mild TR.  Myoview 10/03/16: Study Highlights     The left ventricular ejection fraction is mildly decreased (45-54%).  Nuclear stress EF: 53%.  There was no ST segment deviation noted during stress.  Findings consistent with prior myocardial infarction.  This is a low risk study.   Small distal anterior wall / apical infarct no ischemia EF 53% with apical hypokinesis No ischemia Baseline ECG with RBBB     Assessment / Plan:  1. Symptoms of progressive fatigue and dyspnea on exertion. Etiology unclear. This may be metabolic or related to anemia. Will check complete blood tests including CBC, CMET, lipids, TSH. Differential includes progressive ischemia but stress test earlier this year looked good. Rule out pulmonary embolic disease. ? Inflammatory related. Will check D dimer, CRP, and BNP. Check CXR. If above studies are negative I would recommend cardiac cath to make sure he doesn't have progressive graft disease. Other possibility would be recurrent CA (history of CA tongue).   2.  Coronary disease status post CABG. Negative Myoview in January. See note above. Continue risk factor modification. On ASA and statin.   3. 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 Reported by oncology to be cancer free.   4. Hyperlipidemia. On  crestor and Zetia.   5. History of RAS - now normotensive.  6. Esophageal disease post radiation. Still requiring periodic dilation.   7. Bifascicular block- chronic and asymptomatic.   8. Orthostatic hypotension. On midodrine.

## 2017-04-02 ENCOUNTER — Ambulatory Visit
Admission: RE | Admit: 2017-04-02 | Discharge: 2017-04-02 | Disposition: A | Discharge status: Home / Self Care | Payer: Medicare Other | Source: Ambulatory Visit | Attending: Cardiology | Admitting: Cardiology

## 2017-04-02 ENCOUNTER — Ambulatory Visit (INDEPENDENT_AMBULATORY_CARE_PROVIDER_SITE_OTHER): Payer: Medicare Other | Admitting: Cardiology

## 2017-04-02 ENCOUNTER — Encounter: Payer: Self-pay | Admitting: Cardiology

## 2017-04-02 VITALS — BP 138/82 | HR 59 | Ht 73.0 in | Wt 185.0 lb

## 2017-04-02 DIAGNOSIS — I350 Nonrheumatic aortic (valve) stenosis: Secondary | ICD-10-CM

## 2017-04-02 DIAGNOSIS — I951 Orthostatic hypotension: Secondary | ICD-10-CM

## 2017-04-02 DIAGNOSIS — R5383 Other fatigue: Secondary | ICD-10-CM

## 2017-04-02 DIAGNOSIS — E7800 Pure hypercholesterolemia, unspecified: Secondary | ICD-10-CM

## 2017-04-02 DIAGNOSIS — R0609 Other forms of dyspnea: Secondary | ICD-10-CM | POA: Diagnosis not present

## 2017-04-02 DIAGNOSIS — I259 Chronic ischemic heart disease, unspecified: Secondary | ICD-10-CM

## 2017-04-02 DIAGNOSIS — R0602 Shortness of breath: Secondary | ICD-10-CM | POA: Diagnosis not present

## 2017-04-02 NOTE — Patient Instructions (Signed)
We will check lab work and a chest X ray today. If these are ok we will schedule you for a heart cath  Next week.  Continue your current meds. I would avoid exertion until we get this sorted out

## 2017-04-03 ENCOUNTER — Other Ambulatory Visit: Payer: Self-pay | Admitting: Cardiology

## 2017-04-03 ENCOUNTER — Telehealth: Payer: Self-pay | Admitting: *Deleted

## 2017-04-03 ENCOUNTER — Encounter: Payer: Self-pay | Admitting: *Deleted

## 2017-04-03 DIAGNOSIS — I209 Angina pectoris, unspecified: Secondary | ICD-10-CM

## 2017-04-03 LAB — LIPID PANEL
CHOLESTEROL TOTAL: 200 mg/dL — AB (ref 100–199)
Chol/HDL Ratio: 4.1 ratio (ref 0.0–5.0)
HDL: 49 mg/dL (ref >39)
LDL Calculated: 117 mg/dL — ABNORMAL HIGH (ref 0–99)
Triglycerides: 169 mg/dL — ABNORMAL HIGH (ref 0–149)
VLDL CHOLESTEROL CAL: 34 mg/dL (ref 5–40)

## 2017-04-03 LAB — CBC
Hematocrit: 42.1 % (ref 37.5–51.0)
Hemoglobin: 14.4 g/dL (ref 13.0–17.7)
MCH: 32.4 pg (ref 26.6–33.0)
MCHC: 34.2 g/dL (ref 31.5–35.7)
MCV: 95 fL (ref 79–97)
PLATELETS: 163 10*3/uL (ref 150–379)
RBC: 4.44 x10E6/uL (ref 4.14–5.80)
RDW: 14.9 % (ref 12.3–15.4)
WBC: 6.4 10*3/uL (ref 3.4–10.8)

## 2017-04-03 LAB — COMPREHENSIVE METABOLIC PANEL
ALBUMIN: 5.1 g/dL — AB (ref 3.5–4.8)
ALK PHOS: 33 IU/L — AB (ref 39–117)
ALT: 29 IU/L (ref 0–44)
AST: 32 IU/L (ref 0–40)
Albumin/Globulin Ratio: 1.8 (ref 1.2–2.2)
BILIRUBIN TOTAL: 0.3 mg/dL (ref 0.0–1.2)
BUN / CREAT RATIO: 16 (ref 10–24)
BUN: 25 mg/dL (ref 8–27)
CO2: 24 mmol/L (ref 20–29)
CREATININE: 1.6 mg/dL — AB (ref 0.76–1.27)
Calcium: 10.3 mg/dL — ABNORMAL HIGH (ref 8.6–10.2)
Chloride: 101 mmol/L (ref 96–106)
GFR calc Af Amer: 48 mL/min/{1.73_m2} — ABNORMAL LOW (ref >59)
GFR calc non Af Amer: 41 mL/min/{1.73_m2} — ABNORMAL LOW (ref >59)
GLOBULIN, TOTAL: 2.9 g/dL (ref 1.5–4.5)
GLUCOSE: 103 mg/dL — AB (ref 65–99)
Potassium: 5.6 mmol/L — ABNORMAL HIGH (ref 3.5–5.2)
SODIUM: 140 mmol/L (ref 134–144)
Total Protein: 8 g/dL (ref 6.0–8.5)

## 2017-04-03 LAB — PROTIME-INR
INR: 1 (ref 0.8–1.2)
PROTHROMBIN TIME: 10.5 s (ref 9.1–12.0)

## 2017-04-03 LAB — TSH: TSH: 1.78 u[IU]/mL (ref 0.450–4.500)

## 2017-04-03 LAB — D-DIMER, QUANTITATIVE: D-DIMER: 0.37 mg/L FEU (ref 0.00–0.49)

## 2017-04-03 LAB — SEDIMENTATION RATE: SED RATE: 4 mm/h (ref 0–30)

## 2017-04-03 LAB — PRO B NATRIURETIC PEPTIDE: NT-Pro BNP: 176 pg/mL (ref 0–486)

## 2017-04-03 LAB — C-REACTIVE PROTEIN: CRP: 0.5 mg/L (ref 0.0–4.9)

## 2017-04-03 NOTE — Telephone Encounter (Signed)
Advised patient of results, stopping Celebrex/avoiding NSAIDS, and hydration. Cardiac cath scheduled for 04/07/17 at 12. Advised patient and wife of date, time, location, and precath instructions

## 2017-04-03 NOTE — Telephone Encounter (Signed)
-----   Message from Peter M Martinique, MD sent at 04/03/2017  9:04 AM EDT ----- Renal function is slightly worse. Potassium is elevated. Other chemistries are OK. CBC, TSH, D- dimer, BNP all normal. Inflammatory markers are normal. Recommend he stop taking Celebrex. Avoid NSAIDs. Hydrate well. Would recommend setting him him up for cardiac cath on Tuesday with grafts and possible intervention. Will need to recheck BMET in short stay on arrival.  Peter Martinique MD, [facility]

## 2017-04-06 ENCOUNTER — Telehealth: Payer: Self-pay

## 2017-04-06 NOTE — Telephone Encounter (Signed)
Patient contacted pre-catheterization at [facility] scheduled for: 04/07/2017 @ 1200 Verified arrival time and place: NT @ 1000.  Per wife, they were advised to arrive at 3 am d/t needing repeat BMET.  Advised wife that would be fine. Confirmed AM meds to be taken pre-cath with sip of water: Confirmed Pt will take ASA prior to arrival.   Confirmed patient has responsible person to drive home post procedure and observe patient for 24 hours:  wife Addl concerns:  Pt needs repeat BMET d/t K+ 5.6, Cr 1.6 during pre lab check.

## 2017-04-06 NOTE — Addendum Note (Signed)
Addended by: Zebedee Iba on: 04/06/2017 02:11 PM   Modules accepted: Orders

## 2017-04-07 ENCOUNTER — Encounter (HOSPITAL_COMMUNITY)
Admission: RE | Disposition: A | Discharge status: Home / Self Care | Payer: Self-pay | Source: Ambulatory Visit | Attending: Cardiology

## 2017-04-07 ENCOUNTER — Ambulatory Visit (HOSPITAL_COMMUNITY)
Admission: RE | Admit: 2017-04-07 | Discharge: 2017-04-07 | Disposition: A | Discharge status: Home / Self Care | Payer: Medicare Other | Source: Ambulatory Visit | Attending: Cardiology | Admitting: Cardiology

## 2017-04-07 DIAGNOSIS — I350 Nonrheumatic aortic (valve) stenosis: Secondary | ICD-10-CM | POA: Insufficient documentation

## 2017-04-07 DIAGNOSIS — Z923 Personal history of irradiation: Secondary | ICD-10-CM | POA: Diagnosis not present

## 2017-04-07 DIAGNOSIS — Z86718 Personal history of other venous thrombosis and embolism: Secondary | ICD-10-CM | POA: Diagnosis not present

## 2017-04-07 DIAGNOSIS — G620 Drug-induced polyneuropathy: Secondary | ICD-10-CM | POA: Diagnosis not present

## 2017-04-07 DIAGNOSIS — E039 Hypothyroidism, unspecified: Secondary | ICD-10-CM | POA: Insufficient documentation

## 2017-04-07 DIAGNOSIS — K5900 Constipation, unspecified: Secondary | ICD-10-CM | POA: Diagnosis not present

## 2017-04-07 DIAGNOSIS — Z7982 Long term (current) use of aspirin: Secondary | ICD-10-CM | POA: Diagnosis not present

## 2017-04-07 DIAGNOSIS — K229 Disease of esophagus, unspecified: Secondary | ICD-10-CM | POA: Insufficient documentation

## 2017-04-07 DIAGNOSIS — Z87891 Personal history of nicotine dependence: Secondary | ICD-10-CM | POA: Insufficient documentation

## 2017-04-07 DIAGNOSIS — Z951 Presence of aortocoronary bypass graft: Secondary | ICD-10-CM | POA: Insufficient documentation

## 2017-04-07 DIAGNOSIS — D696 Thrombocytopenia, unspecified: Secondary | ICD-10-CM | POA: Insufficient documentation

## 2017-04-07 DIAGNOSIS — I25119 Atherosclerotic heart disease of native coronary artery with unspecified angina pectoris: Secondary | ICD-10-CM | POA: Diagnosis not present

## 2017-04-07 DIAGNOSIS — I2584 Coronary atherosclerosis due to calcified coronary lesion: Secondary | ICD-10-CM | POA: Insufficient documentation

## 2017-04-07 DIAGNOSIS — I209 Angina pectoris, unspecified: Secondary | ICD-10-CM | POA: Diagnosis present

## 2017-04-07 DIAGNOSIS — I951 Orthostatic hypotension: Secondary | ICD-10-CM | POA: Diagnosis not present

## 2017-04-07 DIAGNOSIS — G4700 Insomnia, unspecified: Secondary | ICD-10-CM | POA: Insufficient documentation

## 2017-04-07 DIAGNOSIS — Z85810 Personal history of malignant neoplasm of tongue: Secondary | ICD-10-CM | POA: Insufficient documentation

## 2017-04-07 DIAGNOSIS — I452 Bifascicular block: Secondary | ICD-10-CM | POA: Diagnosis not present

## 2017-04-07 DIAGNOSIS — F329 Major depressive disorder, single episode, unspecified: Secondary | ICD-10-CM | POA: Diagnosis not present

## 2017-04-07 DIAGNOSIS — E7800 Pure hypercholesterolemia, unspecified: Secondary | ICD-10-CM | POA: Diagnosis present

## 2017-04-07 DIAGNOSIS — Z8249 Family history of ischemic heart disease and other diseases of the circulatory system: Secondary | ICD-10-CM | POA: Diagnosis not present

## 2017-04-07 DIAGNOSIS — I701 Atherosclerosis of renal artery: Secondary | ICD-10-CM | POA: Diagnosis not present

## 2017-04-07 HISTORY — PX: LEFT HEART CATH AND CORS/GRAFTS ANGIOGRAPHY: CATH118250

## 2017-04-07 LAB — BASIC METABOLIC PANEL
ANION GAP: 9 (ref 5–15)
BUN: 22 mg/dL — AB (ref 6–20)
CALCIUM: 9.8 mg/dL (ref 8.9–10.3)
CO2: 23 mmol/L (ref 22–32)
Chloride: 106 mmol/L (ref 101–111)
Creatinine, Ser: 1.43 mg/dL — ABNORMAL HIGH (ref 0.61–1.24)
GFR calc Af Amer: 53 mL/min — ABNORMAL LOW (ref >60)
GFR calc non Af Amer: 46 mL/min — ABNORMAL LOW (ref >60)
GLUCOSE: 103 mg/dL — AB (ref 65–99)
Potassium: 4.6 mmol/L (ref 3.5–5.1)
Sodium: 138 mmol/L (ref 135–145)

## 2017-04-07 SURGERY — LEFT HEART CATH AND CORS/GRAFTS ANGIOGRAPHY
Anesthesia: LOCAL

## 2017-04-07 MED ORDER — SODIUM CHLORIDE 0.9 % WEIGHT BASED INFUSION: 1.0000 mL/kg/h | INTRAVENOUS | Status: DC

## 2017-04-07 MED ORDER — SODIUM CHLORIDE 0.9% FLUSH: 3.0000 mL | INTRAVENOUS | Status: DC | PRN

## 2017-04-07 MED ORDER — SODIUM CHLORIDE 0.9% FLUSH: 3.0000 mL | Freq: Two times a day (BID) | INTRAVENOUS | Status: DC

## 2017-04-07 MED ORDER — HEPARIN SODIUM (PORCINE) 1000 UNIT/ML IJ SOLN
INTRAMUSCULAR | Status: AC
  Filled 2017-04-07: qty 1

## 2017-04-07 MED ORDER — IOPAMIDOL (ISOVUE-370) INJECTION 76%
INTRAVENOUS | Status: AC
  Filled 2017-04-07: qty 125

## 2017-04-07 MED ORDER — HEPARIN (PORCINE) IN NACL 2-0.9 UNIT/ML-% IJ SOLN
INTRAMUSCULAR | Status: AC | PRN
  Administered 2017-04-07: 1000 mL

## 2017-04-07 MED ORDER — HYDRALAZINE HCL 20 MG/ML IJ SOLN
INTRAMUSCULAR | Status: AC
  Filled 2017-04-07: qty 1

## 2017-04-07 MED ORDER — MIDAZOLAM HCL 2 MG/2ML IJ SOLN
INTRAMUSCULAR | Status: DC | PRN
  Administered 2017-04-07 (×2): 1 mg via INTRAVENOUS

## 2017-04-07 MED ORDER — FENTANYL CITRATE (PF) 100 MCG/2ML IJ SOLN
INTRAMUSCULAR | Status: DC | PRN
  Administered 2017-04-07 (×2): 25 ug via INTRAVENOUS

## 2017-04-07 MED ORDER — VERAPAMIL HCL 2.5 MG/ML IV SOLN
INTRAVENOUS | Status: DC | PRN
  Administered 2017-04-07 (×2): via INTRA_ARTERIAL

## 2017-04-07 MED ORDER — LIDOCAINE HCL (PF) 1 % IJ SOLN
INTRAMUSCULAR | Status: AC
  Filled 2017-04-07: qty 30

## 2017-04-07 MED ORDER — SODIUM CHLORIDE 0.9 % IV SOLN: 250.0000 mL | INTRAVENOUS | Status: DC | PRN

## 2017-04-07 MED ORDER — VERAPAMIL HCL 2.5 MG/ML IV SOLN
INTRAVENOUS | Status: AC
  Filled 2017-04-07: qty 2

## 2017-04-07 MED ORDER — HEPARIN (PORCINE) IN NACL 2-0.9 UNIT/ML-% IJ SOLN
INTRAMUSCULAR | Status: AC
  Filled 2017-04-07: qty 1000

## 2017-04-07 MED ORDER — IOPAMIDOL (ISOVUE-370) INJECTION 76%
INTRAVENOUS | Status: DC | PRN
  Administered 2017-04-07: 100 mL via INTRA_ARTERIAL

## 2017-04-07 MED ORDER — SODIUM CHLORIDE 0.9 % WEIGHT BASED INFUSION
3.0000 mL/kg/h | INTRAVENOUS | Status: AC
  Administered 2017-04-07: 3 mL/kg/h via INTRAVENOUS

## 2017-04-07 MED ORDER — HYDRALAZINE HCL 20 MG/ML IJ SOLN: 5.0000 mg | Freq: Once | INTRAMUSCULAR | Status: DC

## 2017-04-07 MED ORDER — LIDOCAINE HCL (PF) 1 % IJ SOLN
INTRAMUSCULAR | Status: DC | PRN
  Administered 2017-04-07: 2 mL

## 2017-04-07 MED ORDER — ASPIRIN 81 MG PO CHEW: 81.0000 mg | CHEWABLE_TABLET | ORAL | Status: DC

## 2017-04-07 MED ORDER — HEPARIN SODIUM (PORCINE) 1000 UNIT/ML IJ SOLN
INTRAMUSCULAR | Status: DC | PRN
  Administered 2017-04-07: 4000 [IU] via INTRAVENOUS

## 2017-04-07 MED ORDER — FENTANYL CITRATE (PF) 100 MCG/2ML IJ SOLN
INTRAMUSCULAR | Status: AC
  Filled 2017-04-07: qty 2

## 2017-04-07 MED ORDER — MIDAZOLAM HCL 2 MG/2ML IJ SOLN
INTRAMUSCULAR | Status: AC
  Filled 2017-04-07: qty 2

## 2017-04-07 SURGICAL SUPPLY — 13 items
CATH INFINITI 5 FR IM (CATHETERS) ×2 IMPLANT
CATH INFINITI 5 FR MPA2 (CATHETERS) ×2 IMPLANT
CATH INFINITI 5 FR RCB (CATHETERS) ×2 IMPLANT
CATH INFINITI 5FR AL1 (CATHETERS) ×2 IMPLANT
CATH INFINITI 5FR MULTPACK ANG (CATHETERS) ×2 IMPLANT
DEVICE RAD COMP TR BAND LRG (VASCULAR PRODUCTS) ×2 IMPLANT
GLIDESHEATH SLEND SS 6F .021 (SHEATH) ×2 IMPLANT
GUIDEWIRE INQWIRE 1.5J.035X260 (WIRE) ×1 IMPLANT
INQWIRE 1.5J .035X260CM (WIRE) ×2
KIT HEART LEFT (KITS) ×2 IMPLANT
PACK CARDIAC CATHETERIZATION (CUSTOM PROCEDURE TRAY) ×2 IMPLANT
TRANSDUCER W/STOPCOCK (MISCELLANEOUS) ×2 IMPLANT
TUBING CIL FLEX 10 FLL-RA (TUBING) ×2 IMPLANT

## 2017-04-07 NOTE — Progress Notes (Signed)
Lindsi Roberts,PA notified of B/P now 154/77 and per Lindsi will not give hydralazine

## 2017-04-07 NOTE — Interval H&P Note (Signed)
History and Physical Interval Note:  04/07/2017 12:36 PM Cath Lab Visit (complete for each Cath Lab visit)  Clinical Evaluation Leading to the Procedure:   ACS: No.  Non-ACS:    Anginal Classification: CCS III  Anti-ischemic medical therapy: No Therapy  Non-Invasive Test Results: Low-risk stress test findings: cardiac mortality <1%/year  Prior CABG: Previous CABG       George Bradley  has presented today for surgery, with the diagnosis of cp  The various methods of treatment have been discussed with the patient and family. After consideration of risks, benefits and other options for treatment, the patient has consented to  Procedure(s): Left Heart Cath and Cors/Grafts Angiography (N/A) as a surgical intervention .  The patient's history has been reviewed, patient examined, no change in status, stable for surgery.  I have reviewed the patient's chart and labs.  Questions were answered to the patient's satisfaction.     [name]

## 2017-04-07 NOTE — H&P (View-Only) (Signed)
Subjective:   George Bradley is seen for evaluation of increased SOB/dyspnea.   He had coronary artery bypass grafting in 2000. His last catheterization was in 2007 and showed patent saphenous vein grafts x3 with a patent left internal mammary artery and well-preserved LV function. His EF is 55%. His last nuclear stress test in January 2018 showed a small fixed apical defect with normal ejection fraction. No ischemia. Echo in April showed normal EF and mild AS.  He also has a history of orthostatic hypotension and has persistent lightheadedness. He does note that increase salt intake helps. He has been on no antihypertensive therapy. He's had a history of what sounds like a Maze procedure when he lived in [place]. His other problems include hyperlipidemia, insomnia, oral cancer, and a history of depression. 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 He has had esophageal dilitation several times in the past.  On followup today he reports that for the past 2 months he has progressive fatigue, exercise intolerance, and DOE. He plays golf regularly. He got to where he would have to stop playing after 15 holes, then 9 holes and more recently even one hole was a struggle. No chest pain. Yesterday he notes having to pull off the road driving because he felt so poor. Now notes dizziness most of the time and not just when standing. He has chronic arthritis. Takes Miralax once a week for constipation. S/p TUIP in April. No fever or chills. Notes he has a treadmill at home and he could previously walk 5 miles/day but now can barely walk 1/4 mile.   Current Outpatient Prescriptions  Medication Sig Dispense Refill  . aspirin EC 81 MG tablet Take 1 tablet (81 mg total) by mouth at bedtime.    . celecoxib (CELEBREX) 100 MG capsule Take 1 capsule by mouth daily as needed.  0  . gabapentin (NEURONTIN) 100 MG capsule Take 100-200 mg by mouth 3 (three) times daily. Takes 1 capsule  twice daily and 2 capsules at bedtime    . levothyroxine (SYNTHROID, LEVOTHROID) 100 MCG tablet TAKE ONE TABLET BY MOUTH ONCE DAILY BEFORE  BREAKFAST 90 tablet 0  . LORazepam (ATIVAN) 1 MG tablet TAKE 1 TABLET BY MOUTH THREE TIMES DAILY 270 tablet 0  . midodrine (PROAMATINE) 10 MG tablet Take 1 tablet (10 mg total) by mouth 3 (three) times daily. 90 tablet 6  . pantoprazole (PROTONIX) 40 MG tablet TAKE 1 TABLET BY MOUTH EVERY DAY BEFORE BREAKFAST 30 tablet 5  . pilocarpine (SALAGEN) 7.5 MG tablet TAKE 1 TABLET BY MOUTH THREE TIMES DAILY 270 tablet 3  . polyethylene glycol (MIRALAX / GLYCOLAX) packet Take 17 g by mouth daily as needed.    . rosuvastatin (CRESTOR) 40 MG tablet Take 1 tablet (40 mg total) by mouth at bedtime. 90 tablet 3  . vitamin B-12 (CYANOCOBALAMIN) 1000 MCG tablet Take 1,000 mcg by mouth daily.    Marland Kitchen zolpidem (AMBIEN) 10 MG tablet Take 10 mg by mouth at bedtime as needed for sleep. Reported on 03/26/2016     No current facility-administered medications for this visit.     Allergies  Allergen Reactions  . Zetia [Ezetimibe] Other (See Comments)    "made me feel bad"    Patient Active Problem List   Diagnosis Date Noted  . Constipation 03/23/2014  . Dysphagia 03/23/2014  . 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 Xerostomia 06/10/2013  . Thrombocytopenia ([place]) 06/10/2013  .  S/P radiation therapy   . 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 Orthostatic hypotension 05/11/2012  . Cancer of base of tongue ([place]) 11/17/2011  . Depression 10/01/2011  . Renal artery stenosis, native, bilateral ([place]) 09/03/2011  . 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 Ischemic heart disease 02/20/2011  . Hypercholesterolemia 02/20/2011  . Aortic valve stenosis, mild 02/20/2011    History  Smoking Status  . Former Smoker  . Years: 2.00  . Types: Cigars  . Quit date: 09/21/2009  Smokeless Tobacco  . Former Systems developer    Comment: 2 cigars a week    History  Alcohol Use  . 0.0  oz/week    Comment: rare  wine    Family History  Problem Relation Age of Onset  . Heart disease Father   . Heart disease Brother   . Heart disease Sister   . Cancer Sister        breast cancer  . Cancer Mother        breast ca  . Hyperlipidemia Son     Review of Systems:   As noted in history of present illness.  All other systems were reviewed and are negative.   Physical Exam:    BP 138/82   Pulse (!) 59   Ht 6\' 1"  (1.854 m)   Wt 185 lb (83.9 kg)   BMI 24.41 kg/m     The head is normocephalic and atraumatic.  Pupils are equally round and reactive to light.  Sclerae nonicteric.  Conjunctiva is clear.  Oropharynx is clear.   Neck is supple there are no masses.  Thyroid is not enlarged.  There is no lymphadenopathy.  Lungs are clear.  Chest is symmetric.  He has some keloid formation at sternal scar. Heart shows a regular rate and rhythm.  S1 and S2 are normal.  There is a soft systolic outflow murmur.  Abdomen is soft normal bowel sounds. There is no organomegaly.   Extremities are without edema.  Peripheral pulses are adequate.  Neurologically intact.  Skin is warm and dry.  Laboratory data:  Lab Results  Component Value Date   WBC 6.0 12/28/2015   HGB 14.3 12/30/2016   HCT 42.0 12/30/2016   PLT 141 12/28/2015   GLUCOSE 104 (H) 12/30/2016   CHOL 161 02/20/2011   TRIG 101.0 02/20/2011   HDL 51.60 02/20/2011   LDLCALC 89 02/20/2011   ALT 31 12/28/2015   AST 33 12/28/2015   NA 145 12/30/2016   K 4.9 12/30/2016   CL 106 12/30/2016   CREATININE 1.40 (H) 12/30/2016   BUN 27 (H) 12/30/2016   CO2 26 12/28/2015   TSH 2.214 12/28/2015   INR 1.07 11/11/2011     Labs from Dr. Virgina Jock dated 07/21/16: BUN 24, creatinine 1.5. Other CMET normal.  Dated 01/15/16: cholesterol 185, triglycerides 95, LDL 122, HDL 44.   Echo dated 02/03/17: Study Conclusions  - Left ventricle: The cavity size was normal. Wall thickness was   increased in a pattern of moderate LVH. Systolic  function was   normal. The estimated ejection fraction was in the range of 55%   to 60%. Wall motion was normal; there were no regional wall   motion abnormalities. Doppler parameters are consistent with   abnormal left ventricular relaxation (grade 1 diastolic   dysfunction). - Aortic valve: Valve mobility was restricted. There was mild   stenosis. There was mild regurgitation. Valve area (VTI): 1.53   cm^2. Valve area (Vmax): 1.47 cm^2. Valve area (Vmean): 1.48  cm^2.  Impressions:  - Normal LV systolic function; moderate LVH; mild diastolic   dysfunction; calcified aortic valve with mild AS (mean gradient   13 mmHg); mild AI; mild TR.  Myoview 10/03/16: Study Highlights     The left ventricular ejection fraction is mildly decreased (45-54%).  Nuclear stress EF: 53%.  There was no ST segment deviation noted during stress.  Findings consistent with prior myocardial infarction.  This is a low risk study.   Small distal anterior wall / apical infarct no ischemia EF 53% with apical hypokinesis No ischemia Baseline ECG with RBBB     Assessment / Plan:  1. Symptoms of progressive fatigue and dyspnea on exertion. Etiology unclear. This may be metabolic or related to anemia. Will check complete blood tests including CBC, CMET, lipids, TSH. Differential includes progressive ischemia but stress test earlier this year looked good. Rule out pulmonary embolic disease. ? Inflammatory related. Will check D dimer, CRP, and BNP. Check CXR. If above studies are negative I would recommend cardiac cath to make sure he doesn't have progressive graft disease. Other possibility would be recurrent CA (history of CA tongue).   2.  Coronary disease status post CABG. Negative Myoview in January. See note above. Continue risk factor modification. On ASA and statin.   3. 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 Reported by oncology to be cancer free.   4. Hyperlipidemia. On  crestor and Zetia.   5. History of RAS - now normotensive.  6. Esophageal disease post radiation. Still requiring periodic dilation.   7. Bifascicular block- chronic and asymptomatic.   8. Orthostatic hypotension. On midodrine.

## 2017-04-07 NOTE — Discharge Instructions (Signed)

## 2017-04-07 NOTE — Progress Notes (Signed)
Lindsi,NP notified of client's B/P and order noted

## 2017-04-08 ENCOUNTER — Encounter (HOSPITAL_COMMUNITY): Payer: Self-pay | Admitting: Cardiology

## 2017-04-08 MED FILL — Verapamil HCl IV Soln 2.5 MG/ML: INTRAVENOUS | Qty: 2 | Status: AC

## 2017-04-10 ENCOUNTER — Encounter: Payer: Self-pay | Admitting: Neurology

## 2017-04-13 ENCOUNTER — Other Ambulatory Visit (INDEPENDENT_AMBULATORY_CARE_PROVIDER_SITE_OTHER): Payer: Self-pay | Admitting: Internal Medicine

## 2017-04-13 ENCOUNTER — Ambulatory Visit: Payer: Medicare Other | Admitting: Cardiology

## 2017-04-13 DIAGNOSIS — N5201 Erectile dysfunction due to arterial insufficiency: Secondary | ICD-10-CM | POA: Diagnosis not present

## 2017-04-13 DIAGNOSIS — R3912 Poor urinary stream: Secondary | ICD-10-CM | POA: Diagnosis not present

## 2017-04-13 DIAGNOSIS — N401 Enlarged prostate with lower urinary tract symptoms: Secondary | ICD-10-CM | POA: Diagnosis not present

## 2017-04-23 ENCOUNTER — Ambulatory Visit: Payer: Medicare Other | Admitting: Physician Assistant

## 2017-04-30 DIAGNOSIS — J302 Other seasonal allergic rhinitis: Secondary | ICD-10-CM | POA: Diagnosis not present

## 2017-04-30 DIAGNOSIS — C029 Malignant neoplasm of tongue, unspecified: Secondary | ICD-10-CM | POA: Diagnosis not present

## 2017-04-30 DIAGNOSIS — R413 Other amnesia: Secondary | ICD-10-CM | POA: Diagnosis not present

## 2017-04-30 DIAGNOSIS — R42 Dizziness and giddiness: Secondary | ICD-10-CM | POA: Diagnosis not present

## 2017-04-30 DIAGNOSIS — I350 Nonrheumatic aortic (valve) stenosis: Secondary | ICD-10-CM | POA: Diagnosis not present

## 2017-04-30 DIAGNOSIS — I951 Orthostatic hypotension: Secondary | ICD-10-CM | POA: Diagnosis not present

## 2017-04-30 DIAGNOSIS — Z6825 Body mass index (BMI) 25.0-25.9, adult: Secondary | ICD-10-CM | POA: Diagnosis not present

## 2017-05-07 ENCOUNTER — Other Ambulatory Visit (INDEPENDENT_AMBULATORY_CARE_PROVIDER_SITE_OTHER): Payer: Self-pay | Admitting: Internal Medicine

## 2017-05-07 DIAGNOSIS — R1013 Epigastric pain: Principal | ICD-10-CM

## 2017-05-07 DIAGNOSIS — G8929 Other chronic pain: Secondary | ICD-10-CM

## 2017-06-02 ENCOUNTER — Encounter: Payer: Self-pay | Admitting: Neurology

## 2017-06-02 ENCOUNTER — Ambulatory Visit (INDEPENDENT_AMBULATORY_CARE_PROVIDER_SITE_OTHER): Payer: Medicare Other | Admitting: Neurology

## 2017-06-02 VITALS — BP 96/48 | HR 65 | Ht 73.0 in | Wt 188.0 lb

## 2017-06-02 DIAGNOSIS — G629 Polyneuropathy, unspecified: Secondary | ICD-10-CM

## 2017-06-02 DIAGNOSIS — I259 Chronic ischemic heart disease, unspecified: Secondary | ICD-10-CM

## 2017-06-02 DIAGNOSIS — Z8589 Personal history of malignant neoplasm of other organs and systems: Secondary | ICD-10-CM | POA: Diagnosis not present

## 2017-06-02 DIAGNOSIS — R413 Other amnesia: Secondary | ICD-10-CM

## 2017-06-02 DIAGNOSIS — R42 Dizziness and giddiness: Secondary | ICD-10-CM | POA: Diagnosis not present

## 2017-06-02 NOTE — Patient Instructions (Addendum)
1. Bloodwork for B12, B1, ESR, CRP, SPEP, IFE, folate  Your provider has requested that you have labwork completed today. Please go to [facility] ([id number]) on the second floor of this building before leaving the office today. You do not need to check in. If you are not called within 15 minutes please check with the front desk.   Unfortunately, our lab is closed this week and will re-open on Monday, June 10, 2017.  Please come back at that time.  You do not need an appointment for labwork, however you will need to come to [facility] ([id number]) to checkin prior to having your labs drawn.   2. Schedule MRI brain with and without contrast  We have sent a referral to [place] for your MRI and they will call you directly to schedule your appt. They are located at [place]. If you need to contact them directly please call [phone].  3. Schedule EMG/NCV of both LE with Dr. Margarita Rana will be contacted by our office to schedule your EMG once Dr. Posey Pronto has a chance to look over your records. If you do not hear from [place] within 10 business days, please call our office at [phone]  4. Control of blood pressure, cholesterol, as well as physical exercise and brain stimulation exercises are important for brain health 5. Follow-up after tests, call for any changes
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
## 2017-06-14 ENCOUNTER — Ambulatory Visit
Admission: RE | Admit: 2017-06-14 | Discharge: 2017-06-14 | Disposition: A | Discharge status: Home / Self Care | Payer: Medicare Other | Source: Ambulatory Visit | Attending: Neurology | Admitting: Neurology

## 2017-06-14 DIAGNOSIS — G629 Polyneuropathy, unspecified: Secondary | ICD-10-CM

## 2017-06-14 DIAGNOSIS — Z8589 Personal history of malignant neoplasm of other organs and systems: Secondary | ICD-10-CM

## 2017-06-14 DIAGNOSIS — R413 Other amnesia: Secondary | ICD-10-CM

## 2017-06-14 DIAGNOSIS — R42 Dizziness and giddiness: Secondary | ICD-10-CM

## 2017-06-14 MED ORDER — GADOBENATE DIMEGLUMINE 529 MG/ML IV SOLN
17.0000 mL | Freq: Once | INTRAVENOUS | Status: AC | PRN
  Administered 2017-06-14: 17 mL via INTRAVENOUS

## 2017-06-15 ENCOUNTER — Telehealth: Payer: Self-pay

## 2017-06-15 NOTE — Telephone Encounter (Signed)
LMOM relaying message below.  

## 2017-06-15 NOTE — Telephone Encounter (Signed)
-----   Message from Cameron Sprang, MD sent at 06/15/2017  9:49 AM EDT ----- Pls let him know MRI brain looks good, no evidence of tumor, stroke, or bleed. Thanks

## 2017-06-17 ENCOUNTER — Other Ambulatory Visit: Payer: Self-pay

## 2017-06-17 ENCOUNTER — Other Ambulatory Visit: Payer: Medicare Other

## 2017-06-17 ENCOUNTER — Telehealth: Payer: Self-pay | Admitting: Neurology

## 2017-06-17 DIAGNOSIS — R42 Dizziness and giddiness: Secondary | ICD-10-CM

## 2017-06-17 DIAGNOSIS — G629 Polyneuropathy, unspecified: Secondary | ICD-10-CM | POA: Diagnosis not present

## 2017-06-17 DIAGNOSIS — R413 Other amnesia: Secondary | ICD-10-CM | POA: Diagnosis not present

## 2017-06-17 DIAGNOSIS — Z8589 Personal history of malignant neoplasm of other organs and systems: Secondary | ICD-10-CM | POA: Diagnosis not present

## 2017-06-17 NOTE — Telephone Encounter (Signed)
Dawn will call to schedule patient.

## 2017-06-17 NOTE — Telephone Encounter (Signed)
Patient came by the office to have Lab work and also mentioned not hearing from anyone regarding setting up an EMG? Please Advise. Thanks

## 2017-06-17 NOTE — Telephone Encounter (Signed)
Thank you :)

## 2017-06-22 LAB — VITAMIN B1: VITAMIN B1 (THIAMINE): 37 nmol/L — AB (ref 8–30)

## 2017-06-22 LAB — IMMUNOFIXATION ELECTROPHORESIS
IGM, SERUM: 70 mg/dL (ref 48–271)
IMMUNOFIX ELECTR INT: DETECTED
IMMUNOGLOBULIN A: 139 mg/dL (ref 81–463)
IgG (Immunoglobin G), Serum: 1157 mg/dL (ref 694–1618)

## 2017-06-22 LAB — PROTEIN ELECTROPHORESIS, SERUM
ALBUMIN ELP: 4.2 g/dL (ref 3.8–4.8)
Abnormal Protein Band1: 0.7 g/dL — ABNORMAL HIGH
Alpha 1: 0.3 g/dL (ref 0.2–0.3)
Alpha 2: 0.8 g/dL (ref 0.5–0.9)
BETA 2: 0.3 g/dL (ref 0.2–0.5)
Beta Globulin: 0.4 g/dL (ref 0.4–0.6)
GAMMA GLOBULIN: 1 g/dL (ref 0.8–1.7)
Total Protein: 7 g/dL (ref 6.1–8.1)

## 2017-06-22 LAB — SEDIMENTATION RATE: Sed Rate: 19 mm/h (ref 0–20)

## 2017-06-22 LAB — C-REACTIVE PROTEIN: CRP: 1.4 mg/L (ref <8.0)

## 2017-06-22 LAB — VITAMIN B12

## 2017-06-22 LAB — FOLATE: Folate: >24 ng/mL

## 2017-06-30 ENCOUNTER — Encounter (INDEPENDENT_AMBULATORY_CARE_PROVIDER_SITE_OTHER): Payer: Self-pay | Admitting: Internal Medicine

## 2017-06-30 ENCOUNTER — Ambulatory Visit (INDEPENDENT_AMBULATORY_CARE_PROVIDER_SITE_OTHER): Payer: Medicare Other | Admitting: Internal Medicine

## 2017-06-30 VITALS — BP 122/78 | HR 74 | Temp 97.8°F | Resp 18 | Ht 73.0 in | Wt 187.0 lb

## 2017-06-30 DIAGNOSIS — K219 Gastro-esophageal reflux disease without esophagitis: Secondary | ICD-10-CM | POA: Diagnosis not present

## 2017-06-30 DIAGNOSIS — R143 Flatulence: Secondary | ICD-10-CM

## 2017-06-30 DIAGNOSIS — K222 Esophageal obstruction: Secondary | ICD-10-CM

## 2017-06-30 DIAGNOSIS — I259 Chronic ischemic heart disease, unspecified: Secondary | ICD-10-CM | POA: Diagnosis not present

## 2017-06-30 MED ORDER — SIMETHICONE 180 MG PO CAPS: 180.0000 mg | ORAL_CAPSULE | Freq: Three times a day (TID) | ORAL | 0 refills | Status: DC | PRN

## 2017-06-30 NOTE — Progress Notes (Signed)
Presenting complaint;  Patient complains of flatulence. Follow-up for GERD and esophageal stricture.  Subjective:  Patient is 39 old Caucasian male who is here for scheduled visit. He was last seen in July 2017. He had esophageal stricture dilated in August 2017. He states he is able to swallow quite well. He feels heartburn is well controlled with therapy. He complains of bloating. He says this symptom comes and goes. He states he had abdominal ultrasound while he was having urinary symptoms. He was told by the tech that he had large amount of gas in his stomach. He says he does belch intermittently and he feels he passes excessive flatus. He has not changes diet recently. Bowels move daily or every other day. He denies melena or rectal bleeding. This appetite is good. He has gained 7 pounds since his last visit. He complains of postural dizziness. He had cardiac cath and grafts were patent. He was noted to have minor AS/AI. He also has seen a neurologist. MR brain was negative. He is scheduled to undergo more studies. He states his abdominal pain is well controlled with lorazepam. He is willing to try lesser dose.    Current Medications: Outpatient Encounter Prescriptions as of 06/30/2017  Medication Sig  . aspirin EC 81 MG tablet Take 1 tablet (81 mg total) by mouth at bedtime.  . gabapentin (NEURONTIN) 100 MG capsule Take 200 mg by mouth 4 (four) times daily.   Marland Kitchen levothyroxine (SYNTHROID, LEVOTHROID) 100 MCG tablet TAKE ONE TABLET BY MOUTH ONCE DAILY BEFORE  BREAKFAST  . LORazepam (ATIVAN) 1 MG tablet TAKE 1 TABLET BY MOUTH THREE TIMES DAILY  . midodrine (PROAMATINE) 10 MG tablet Take 1 tablet (10 mg total) by mouth 3 (three) times daily.  . Multiple Vitamin (MULTIVITAMIN WITH MINERALS) TABS tablet Take 1 tablet by mouth daily.  . pantoprazole (PROTONIX) 40 MG tablet TAKE 1 TABLET BY MOUTH EVERY DAY BEFORE BREAKFAST  . pilocarpine (SALAGEN) 7.5 MG tablet TAKE 1 TABLET BY MOUTH THREE TIMES  DAILY  . polyethylene glycol (MIRALAX / GLYCOLAX) packet Take 17 g by mouth daily as needed for mild constipation.   . rosuvastatin (CRESTOR) 40 MG tablet Take 1 tablet (40 mg total) by mouth at bedtime.  . vitamin B-12 (CYANOCOBALAMIN) 1000 MCG tablet Take 1,000 mcg by mouth daily.  Marland Kitchen zolpidem (AMBIEN) 10 MG tablet Take 10 mg by mouth at bedtime as needed for sleep. Reported on 03/26/2016  . [DISCONTINUED] cetirizine (ZYRTEC) 10 MG tablet Take 10 mg by mouth daily as needed for allergies.  . [DISCONTINUED] diphenhydramine-acetaminophen (TYLENOL PM) 25-500 MG TABS tablet Take 1 tablet by mouth at bedtime as needed.  . [DISCONTINUED] ibuprofen (ADVIL,MOTRIN) 200 MG tablet Take 400 mg by mouth daily as needed for moderate pain.   No facility-administered encounter medications on file as of 06/30/2017.      Objective: Blood pressure 122/78, pulse 74, temperature 97.8 F (36.6 C), temperature source Oral, resp. rate 18, height 6\' 1"  (1.854 m), weight 187 lb (84.8 kg). Ration is alert and in no acute distress. Conjunctiva is pink. Sclera is nonicteric Oropharyngeal mucosa is normal. No neck masses or thyromegaly noted. He has midsternal scar with keloid formation. Cardiac exam with regular rhythm normal S1 and S2. He has grade 2/6 systolic ejection murmur best heard at left upper sternal border and aortic area. Lungs are clear to auscultation. Abdomen is symmetrical. Bowel sounds are normal. On palpation is soft and nontender without organomegaly or masses. Percussion note is normal. No  LE edema or clubbing noted.  Labs/studies Results: Lab data from 04/02/2017  WBC 6.4, H&H 14.4 and 42.1 and platelet count 160 3K.   BUN 25 creatinine 1.60 Calcium 10.3 albumin 5.1 Bilirubin 0.3, AP 33, AST 32, ALT 29.  Assessment:  #1. Flatulence. His abdominal exam is unremarkable today. He does not have any symptoms to suggest small bowel disease or malabsorption. He may have aerophagia. Will treat him  symptomatically and do plain film when he is symptomatic to determine if he has gas in stomach small bowel or colon.  #2. Chronic GERD. He is doing well with therapy.  #3. History of proximal esophageal stricture secondary to radiation therapy. He has required multiple esophageal dilations. Last dilation was in August 2017. He appears to be doing well.  #4. Chronic epigastric pain responsive to lorazepam.  Plan:  Phazyme 180 mg by mouth 3 times a day for 1 week and thereafter on as-needed basis. Will do plain abdominal film when he feels bloated. Patient will call office. Patient will try to decrease lorazepam dose. He will continue taking 1 mg before evening meal but try half a dose before breakfast and lunch. Patient will call if he develops dysphagia. Office visit in one year.

## 2017-06-30 NOTE — Patient Instructions (Addendum)
Take Phazyme 3 times a day for 1 week and thereafter on as-needed basis if it works. Call office when bloating is significant in which case will do plain abdominal films. Try decreasing lorazepam dose as follows. Take half the dose before breakfast and usual dose before lunch and evening meal. Subsequently you could try taking half a dose before breakfast and lunch and full dose before evening meal.

## 2017-07-02 ENCOUNTER — Ambulatory Visit: Payer: Medicare Other | Admitting: Neurology

## 2017-07-02 DIAGNOSIS — M5417 Radiculopathy, lumbosacral region: Secondary | ICD-10-CM

## 2017-07-02 DIAGNOSIS — R42 Dizziness and giddiness: Secondary | ICD-10-CM

## 2017-07-02 DIAGNOSIS — Z8589 Personal history of malignant neoplasm of other organs and systems: Secondary | ICD-10-CM

## 2017-07-02 DIAGNOSIS — R413 Other amnesia: Secondary | ICD-10-CM

## 2017-07-02 NOTE — Procedures (Signed)
[facility]  [address]  [place], [place] [address] Tel: [phone] Fax:  [phone] Test Date:  07/02/2017  Patient: George Bradley DOB: 24-Jun-1941 Physician: Narda Amber, DO  Sex: Male Height: 6\' 0"  Ref Phys: Ellouise Newer, MD  ID#: [id number] Temp: 32.0C Technician:    Patient Complaints: This is a 76 year old gentleman referred for evaluation of gait imbalance.  NCV & EMG Findings: Extensive electrodiagnostic testing of the right lower extremity and additional studies of the left shows:  1. Bilateral sural and superficial peroneal sensory responses are within normal limits. 2. Bilateral peroneal and tibial motor responses are within normal limits. 3. Bilateral tibial H reflexes are within normal limits when adjusted for patient's height.  4. Chronic motor axon loss changes are seen affecting the L5 myotomes bilaterally and the right S1 myotomes. There is no evidence of a chronic active denervation.  Impression: 1. Chronic L5 radiculopathy affecting bilateral lower extremities, moderate in degree electrically. 2. Chronic S1 radiculopathy affecting the right lower extremity, moderate in degree electrically. 3. There is no evidence of a large fiber generalized sensorimotor polyneuropathy.   ___________________________ Narda Amber, DO    Nerve Conduction Studies Anti Sensory Summary Table   Site NR Peak (ms) Norm Peak (ms) P-T Amp (V) Norm P-T Amp  Left Sup Peroneal Anti Sensory (Ant Lat Mall)  32C  12 cm    4.3 <4.6 3.8 >3  Right Sup Peroneal Anti Sensory (Ant Lat Mall)  32C  12 cm    3.5 <4.6 5.0 >3  Left Sural Anti Sensory (Lat Mall)  32C  Calf    4.5 <4.6 6.5 >3  Right Sural Anti Sensory (Lat Mall)  32C  Calf    4.1 <4.6 8.0 >3   Motor Summary Table   Site NR Onset (ms) Norm Onset (ms) O-P Amp (mV) Norm O-P Amp Site1 Site2 Delta-0 (ms) Dist (cm) Vel (m/s) Norm Vel (m/s)  Left Peroneal Motor (Ext Dig Brev)  32C  Ankle    5.4 <6.0  3.5 >2.5 B Fib Ankle 8.4 37.0 44 >40  B Fib    13.8  2.7  Poplt B Fib 2.0 10.0 50 >40  Poplt    15.8  2.6         Right Peroneal Motor (Ext Dig Brev)  32C  Ankle    4.2 <6.0 5.0 >2.5 B Fib Ankle 8.2 39.0 48 >40  B Fib    12.4  4.6  Poplt B Fib 1.3 9.0 69 >40  Poplt    13.7  4.3         Left Tibial Motor (Abd Hall Brev)  32C  Ankle    4.6 <6.0 5.4 >4 Knee Ankle 10.1 42.0 42 >40  Knee    14.7  4.6         Right Tibial Motor (Abd Hall Brev)  32C  Ankle    4.1 <6.0 6.8 >4 Knee Ankle 9.2 44.0 48 >40  Knee    13.3  3.9          H Reflex Studies   NR H-Lat (ms) Lat Norm (ms) L-R H-Lat (ms)  Left Tibial (Gastroc)  32C     40.00 <35 0.00  Right Tibial (Gastroc)  32C     40.00 <35 0.00   EMG   Side Muscle Ins Act Fibs Psw Fasc Number Recrt Dur Dur. Amp Amp. Poly Poly. Comment  Right AntTibialis Nml Nml Nml Nml 1- Rapid Some 1+ Some  1+ Nml Nml N/A  Right Gastroc Nml Nml Nml Nml 1- Rapid Some 1+ Some 1+ Nml Nml N/A  Right Flex Dig Long Nml Nml Nml Nml 1- Rapid Some 1+ Some 1+ Nml Nml N/A  Right RectFemoris Nml Nml Nml Nml Nml Nml Nml Nml Nml Nml Nml Nml N/A  Right GluteusMed Nml Nml Nml Nml 1- Rapid Some 1+ Some 1+ Nml Nml N/A  Right BicepsFemS Nml Nml Nml Nml 1- Rapid Some 1+ Some 1+ Nml Nml N/A  Left AntTibialis Nml Nml Nml Nml Nml Nml Nml Nml Nml Nml Nml Nml N/A  Left Gastroc Nml Nml Nml Nml 1- Rapid Some 1+ Some 1+ Nml Nml N/A  Left RectFemoris Nml Nml Nml Nml Nml Nml Nml Nml Nml Nml Nml Nml N/A  Left GluteusMed Nml Nml Nml Nml Nml Nml Nml Nml Nml Nml Nml Nml N/A  Left BicepsFemS Nml Nml Nml Nml 1- Rapid Some 1+ Some 1+ Nml Nml N/A      Waveforms:

## 2017-07-07 ENCOUNTER — Telehealth: Payer: Self-pay

## 2017-07-07 NOTE — Telephone Encounter (Signed)
-----   Message from Cameron Sprang, MD sent at 07/07/2017  3:04 PM EDT ----- Regarding: pls sched f/u Looks like all the tests have been done, I still have some opening on the afternoon of 10/25 next week, if he can come in to discuss results and next steps, thanks!

## 2017-07-07 NOTE — Telephone Encounter (Signed)
Called pt to relay message below.  3 times someone answered, then hung up without saying anything.  4th time no one answered, no answering machine/voice mail.  Unable to relay message.

## 2017-07-17 DIAGNOSIS — H2511 Age-related nuclear cataract, right eye: Secondary | ICD-10-CM | POA: Diagnosis not present

## 2017-07-17 DIAGNOSIS — H25042 Posterior subcapsular polar age-related cataract, left eye: Secondary | ICD-10-CM | POA: Diagnosis not present

## 2017-07-17 DIAGNOSIS — H25011 Cortical age-related cataract, right eye: Secondary | ICD-10-CM | POA: Diagnosis not present

## 2017-07-17 DIAGNOSIS — H35033 Hypertensive retinopathy, bilateral: Secondary | ICD-10-CM | POA: Diagnosis not present

## 2017-07-17 DIAGNOSIS — H25013 Cortical age-related cataract, bilateral: Secondary | ICD-10-CM | POA: Diagnosis not present

## 2017-07-17 DIAGNOSIS — H40013 Open angle with borderline findings, low risk, bilateral: Secondary | ICD-10-CM | POA: Diagnosis not present

## 2017-07-17 DIAGNOSIS — H2513 Age-related nuclear cataract, bilateral: Secondary | ICD-10-CM | POA: Diagnosis not present

## 2017-07-18 DIAGNOSIS — Z23 Encounter for immunization: Secondary | ICD-10-CM | POA: Diagnosis not present

## 2017-07-20 ENCOUNTER — Other Ambulatory Visit (INDEPENDENT_AMBULATORY_CARE_PROVIDER_SITE_OTHER): Payer: Self-pay | Admitting: Internal Medicine

## 2017-07-22 ENCOUNTER — Encounter: Payer: Self-pay | Admitting: Neurology

## 2017-07-22 ENCOUNTER — Ambulatory Visit (INDEPENDENT_AMBULATORY_CARE_PROVIDER_SITE_OTHER): Payer: Medicare Other | Admitting: Neurology

## 2017-07-22 VITALS — BP 110/70 | HR 63 | Ht 73.0 in | Wt 193.0 lb

## 2017-07-22 DIAGNOSIS — M5417 Radiculopathy, lumbosacral region: Secondary | ICD-10-CM | POA: Diagnosis not present

## 2017-07-22 DIAGNOSIS — D892 Hypergammaglobulinemia, unspecified: Secondary | ICD-10-CM

## 2017-07-22 DIAGNOSIS — R42 Dizziness and giddiness: Secondary | ICD-10-CM | POA: Diagnosis not present

## 2017-07-22 DIAGNOSIS — I259 Chronic ischemic heart disease, unspecified: Secondary | ICD-10-CM

## 2017-07-22 NOTE — Patient Instructions (Addendum)
1. Refer to Hematology for paraproteinemia 2. Follow-up in 6 months, call for any changes

## 2017-07-28 ENCOUNTER — Encounter: Payer: Self-pay | Admitting: Neurology
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
## 2017-07-31 DIAGNOSIS — Z923 Personal history of irradiation: Secondary | ICD-10-CM | POA: Diagnosis not present

## 2017-07-31 DIAGNOSIS — J320 Chronic maxillary sinusitis: Secondary | ICD-10-CM | POA: Diagnosis not present

## 2017-07-31 DIAGNOSIS — J342 Deviated nasal septum: Secondary | ICD-10-CM | POA: Diagnosis not present

## 2017-07-31 DIAGNOSIS — H903 Sensorineural hearing loss, bilateral: Secondary | ICD-10-CM | POA: Diagnosis not present

## 2017-07-31 DIAGNOSIS — Z85818 Personal history of malignant neoplasm of other sites of lip, oral cavity, and pharynx: Secondary | ICD-10-CM | POA: Diagnosis not present

## 2017-07-31 DIAGNOSIS — M950 Acquired deformity of nose: Secondary | ICD-10-CM | POA: Diagnosis not present

## 2017-07-31 DIAGNOSIS — Z87891 Personal history of nicotine dependence: Secondary | ICD-10-CM | POA: Diagnosis not present

## 2017-08-03 DIAGNOSIS — J320 Chronic maxillary sinusitis: Secondary | ICD-10-CM | POA: Diagnosis not present

## 2017-08-03 DIAGNOSIS — J0100 Acute maxillary sinusitis, unspecified: Secondary | ICD-10-CM | POA: Diagnosis not present

## 2017-08-05 ENCOUNTER — Telehealth: Payer: Self-pay | Admitting: Hematology and Oncology

## 2017-08-05 NOTE — Telephone Encounter (Signed)
Left message for patient regarding appt added per 11/14 sch msg.

## 2017-08-10 DIAGNOSIS — Z6825 Body mass index (BMI) 25.0-25.9, adult: Secondary | ICD-10-CM | POA: Diagnosis not present

## 2017-08-10 DIAGNOSIS — R42 Dizziness and giddiness: Secondary | ICD-10-CM | POA: Diagnosis not present

## 2017-08-10 DIAGNOSIS — R413 Other amnesia: Secondary | ICD-10-CM | POA: Diagnosis not present

## 2017-08-10 DIAGNOSIS — E038 Other specified hypothyroidism: Secondary | ICD-10-CM | POA: Diagnosis not present

## 2017-08-10 DIAGNOSIS — I951 Orthostatic hypotension: Secondary | ICD-10-CM | POA: Diagnosis not present

## 2017-08-10 DIAGNOSIS — K5909 Other constipation: Secondary | ICD-10-CM | POA: Diagnosis not present

## 2017-08-10 DIAGNOSIS — D892 Hypergammaglobulinemia, unspecified: Secondary | ICD-10-CM | POA: Diagnosis not present

## 2017-08-17 ENCOUNTER — Ambulatory Visit: Payer: Medicare Other

## 2017-08-17 ENCOUNTER — Ambulatory Visit (HOSPITAL_BASED_OUTPATIENT_CLINIC_OR_DEPARTMENT_OTHER): Payer: Medicare Other | Admitting: Hematology and Oncology

## 2017-08-17 ENCOUNTER — Telehealth: Payer: Self-pay

## 2017-08-17 VITALS — BP 151/76 | HR 58 | Temp 98.0°F | Resp 18 | Ht 73.0 in | Wt 187.7 lb

## 2017-08-17 DIAGNOSIS — R1310 Dysphagia, unspecified: Secondary | ICD-10-CM

## 2017-08-17 DIAGNOSIS — E039 Hypothyroidism, unspecified: Secondary | ICD-10-CM | POA: Diagnosis not present

## 2017-08-17 DIAGNOSIS — C01 Malignant neoplasm of base of tongue: Secondary | ICD-10-CM

## 2017-08-17 DIAGNOSIS — Z85810 Personal history of malignant neoplasm of tongue: Secondary | ICD-10-CM

## 2017-08-17 DIAGNOSIS — R42 Dizziness and giddiness: Secondary | ICD-10-CM | POA: Diagnosis not present

## 2017-08-17 DIAGNOSIS — G620 Drug-induced polyneuropathy: Secondary | ICD-10-CM | POA: Diagnosis not present

## 2017-08-17 DIAGNOSIS — R1319 Other dysphagia: Secondary | ICD-10-CM

## 2017-08-17 DIAGNOSIS — K5900 Constipation, unspecified: Secondary | ICD-10-CM

## 2017-08-17 DIAGNOSIS — R1013 Epigastric pain: Secondary | ICD-10-CM

## 2017-08-17 DIAGNOSIS — T451X5A Adverse effect of antineoplastic and immunosuppressive drugs, initial encounter: Secondary | ICD-10-CM

## 2017-08-17 DIAGNOSIS — E038 Other specified hypothyroidism: Secondary | ICD-10-CM

## 2017-08-17 DIAGNOSIS — D472 Monoclonal gammopathy: Secondary | ICD-10-CM

## 2017-08-17 DIAGNOSIS — R6881 Early satiety: Secondary | ICD-10-CM

## 2017-08-17 NOTE — Telephone Encounter (Signed)
Printed avs and calender for upcoming appointment. Per 11/26 los 

## 2017-08-18 ENCOUNTER — Encounter: Payer: Self-pay | Admitting: Hematology and Oncology

## 2017-08-18 DIAGNOSIS — R42 Dizziness and giddiness: Secondary | ICD-10-CM | POA: Insufficient documentation

## 2017-08-18 NOTE — Progress Notes (Signed)
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 PET/CT scan showed no evidence of distant metastatic disease however he does have evidence of lymphadenopathy. He was offered concurrent chemoradiation therapy with weekly cisplatin between August to October 2012. From 2012 to 2014 he had serial imaging studies which show no evidence of disease recurrence.  INTERVAL HISTORY: Please see below for problem oriented charting. 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 He takes low-dose gabapentin He has seen a pain specialist in the past who prescribed gabapentin Due to his prior history of feeding tube placement, he developed significant scarring near the epigastrium region with pain He had several EGD and dilation procedure done, stopped about a year to 2 years ago He felt that the last esophageal dilation was not helpful He had progressive dysphagia and early satiety with mild weight loss He have chronic esophageal pain and felt that the gabapentin is helpful He denies changes in bowel habits He denies new neck swelling, abnormal tongue lesions since the last time I saw him He has undergone extensive cardiac  evaluation that rule out cardiac causes of his dizziness He has seen neurologist with extensive evaluation including MRI of the brain and blood work  REVIEW OF SYSTEMS:   Constitutional: Denies fevers, chills  Eyes: Denies blurriness of vision Ears, nose, mouth, throat, and face: Denies mucositis or sore throat Respiratory: Denies cough, dyspnea or wheezes Cardiovascular: Denies palpitation, chest discomfort or lower extremity swelling Gastrointestinal:  Denies nausea, heartburn or change in bowel habits Skin: Denies abnormal skin rashes Lymphatics: Denies new lymphadenopathy or easy bruising Behavioral/Psych: Mood is stable, no new changes  All other systems were reviewed with the patient and are negative.  I have reviewed the past medical history, past surgical history, social history and family history with the patient and they are unchanged from previous note.  ALLERGIES:  is allergic to zetia [ezetimibe].  MEDICATIONS:  Current Outpatient Medications  Medication Sig Dispense Refill  . aspirin EC 81 MG tablet Take 1 tablet (81 mg total) by mouth at bedtime.    . gabapentin (NEURONTIN) 100 MG capsule Take 200 mg by mouth 4 (four) times daily.     Marland Kitchen levothyroxine (SYNTHROID, LEVOTHROID) 100 MCG tablet TAKE ONE TABLET BY MOUTH ONCE DAILY BEFORE  BREAKFAST 90 tablet 0  . LORazepam (ATIVAN) 1 MG tablet TAKE 1 TABLET BY MOUTH THREE TIMES DAILY 270 tablet 0  . Multiple Vitamin (MULTIVITAMIN WITH MINERALS) TABS tablet Take 1 tablet by mouth daily.    . pantoprazole (PROTONIX) 40 MG tablet TAKE 1 TABLET BY MOUTH EVERY DAY BEFORE BREAKFAST 30 tablet 11  . pilocarpine (SALAGEN) 7.5 MG tablet TAKE 1 TABLET BY MOUTH THREE TIMES DAILY 270 tablet 3  . polyethylene glycol (MIRALAX / GLYCOLAX) packet Take 17 g by mouth daily as needed for  mild constipation.     . rosuvastatin (CRESTOR) 40 MG tablet Take 1 tablet (40 mg total) by mouth at bedtime. 90 tablet 3  . vitamin B-12 (CYANOCOBALAMIN) 1000  MCG tablet Take 1,000 mcg by mouth daily.    Marland Kitchen zolpidem (AMBIEN) 10 MG tablet Take 10 mg by mouth at bedtime as needed for sleep. Reported on 03/26/2016     No current facility-administered medications for this visit.     PHYSICAL EXAMINATION: ECOG PERFORMANCE STATUS: 1 - Symptomatic but completely ambulatory  Vitals:   08/17/17 1348  BP: (!) 151/76  Pulse: (!) 58  Resp: 18  Temp: 98 F (36.7 C)  SpO2: 99%   Filed Weights   08/17/17 1348  Weight: 187 lb 11.2 oz (85.1 kg)    GENERAL:alert, no distress and comfortable SKIN: skin color, texture, turgor are normal, no rashes or significant lesions EYES: normal, Conjunctiva are pink and non-injected, sclera clear OROPHARYNX:no exudate, no erythema and lips, buccal mucosa, and tongue normal  NECK: supple, thyroid normal size, non-tender, without nodularity LYMPH:  no palpable lymphadenopathy in the cervical, axillary or inguinal LUNGS: clear to auscultation and percussion with normal breathing effort HEART: regular rate & rhythm and no murmurs and no lower extremity edema ABDOMEN:abdomen soft, non-tender and normal bowel sounds Musculoskeletal:no cyanosis of digits and no clubbing  NEURO: alert & oriented x 3 with fluent speech, no focal motor/sensory deficits  LABORATORY DATA:  I have reviewed the data as listed    Component Value Date/Time   NA 138 04/07/2017 1027   NA 140 04/02/2017 1449   NA 141 12/28/2015 0851   K 4.6 04/07/2017 1027   K 4.5 12/28/2015 0851   CL 106 04/07/2017 1027   CL 106 11/25/2012 0812   CO2 23 04/07/2017 1027   CO2 26 12/28/2015 0851   GLUCOSE 103 (H) 04/07/2017 1027   GLUCOSE 98 12/28/2015 0851   GLUCOSE 102 (H) 11/25/2012 0812   BUN 22 (H) 04/07/2017 1027   BUN 25 04/02/2017 1449   BUN 16.5 12/28/2015 0851   CREATININE 1.43 (H) 04/07/2017 1027   CREATININE 1.4 (H) 12/28/2015 0851   CALCIUM 9.8 04/07/2017 1027   CALCIUM 10.0 12/28/2015 0851   PROT 7.0 06/17/2017 1202   PROT 8.0 04/02/2017  1449   PROT 7.7 12/28/2015 0851   ALBUMIN 5.1 (H) 04/02/2017 1449   ALBUMIN 4.1 12/28/2015 0851   AST 32 04/02/2017 1449   AST 33 12/28/2015 0851   ALT 29 04/02/2017 1449   ALT 31 12/28/2015 0851   ALKPHOS 33 (L) 04/02/2017 1449   ALKPHOS 32 (L) 12/28/2015 0851   BILITOT 0.3 04/02/2017 1449   BILITOT 0.53 12/28/2015 0851   GFRNONAA 46 (L) 04/07/2017 1027   GFRAA 53 (L) 04/07/2017 1027    No results found for: SPEP, UPEP  Lab Results  Component Value Date   WBC 6.4 04/02/2017   NEUTROABS 3.2 12/28/2015   HGB 14.4 04/02/2017   HCT 42.1 04/02/2017   MCV 95 04/02/2017   PLT 163 04/02/2017      Chemistry      Component Value Date/Time   NA 138 04/07/2017 1027   NA 140 04/02/2017 1449   NA 141 12/28/2015 0851   K 4.6 04/07/2017 1027   K 4.5 12/28/2015 0851   CL 106 04/07/2017 1027   CL 106 11/25/2012 0812   CO2 23 04/07/2017 1027   CO2 26 12/28/2015 0851   BUN 22 (H) 04/07/2017 1027   BUN 25 04/02/2017  1449   BUN 16.5 12/28/2015 0851   CREATININE 1.43 (H) 04/07/2017 1027   CREATININE 1.4 (H) 12/28/2015 0851      Component Value Date/Time   CALCIUM 9.8 04/07/2017 1027   CALCIUM 10.0 12/28/2015 0851   ALKPHOS 33 (L) 04/02/2017 1449   ALKPHOS 32 (L) 12/28/2015 0851   AST 32 04/02/2017 1449   AST 33 12/28/2015 0851   ALT 29 04/02/2017 1449   ALT 31 12/28/2015 0851   BILITOT 0.3 04/02/2017 1449   BILITOT 0.53 12/28/2015 0851      ASSESSMENT & PLAN:  Cancer of base of tongue The patient has been cancer free from the standpoint of tongue cancer No further workup is needed from the standpoint  MGUS (monoclonal gammopathy of unknown significance) He had recent extensive evaluation for neuropathy and dizziness Further workup from neurologist revealed MGUS We discussed the natural history of MGUS The patient has baseline peripheral neuropathy from prior chemotherapy exposure Certainly, concurrent neuropathy from plasma cell disorder cannot be excluded There is  possibility of concern related to amyloidosis I plan to order evaluation with blood work, 24-hour urine collection and skeletal survey  Dysphagia The patient has progressive dysphagia and early satiety The patient had extensive surgical scarring related related to prior feeding tube placement He had repeat upper endoscopy and esophageal dilation in the past He had progressive weight loss because of this I recommend CT scan for further evaluation to exclude malignancy and he agreed  Hypothyroid The patient had acquired hypothyroidism With recent dizziness, abnormal changes in appetite and weight loss, I recommend repeat TSH evaluation and he agreed to proceed  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, thought to be related to prior chemotherapy exposure Serum vitamin B12 was checked and it was within normal limits He is only taking low-dose gabapentin I recommend increasing the dose of gabapentin to 300 mg twice a day to see if this could control his symptoms better I did warn him about risk of sedation and constipation with this  Dizziness The patient has been complaining of chronic dizziness He had several episodes of near fall experience but denies any falls or injuries He had extensive cardiac evaluation recently he has decline in performance status and is unable to run due to reduced stamina and shortness of breath on moderate exertion His symptoms are vague He had MRI of the brain which was unremarkable for intracranial pathology He had extensive neurology evaluation I told him that I might not be able to find conclusive cause of his dizziness but I think it would be prudent to exclude plasma cell disorder such as amyloidosis that could present with vague symptoms    Orders Placed This Encounter  Procedures  . DG Bone Survey Met    Standing Status:   Future    Standing Expiration Date:   10/17/2018    Order Specific Question:    Reason for Exam (SYMPTOM  OR DIAGNOSIS REQUIRED)    Answer:   staging myeloma    Order Specific Question:   Preferred imaging location?    Answer:   [facility]  . CT ABDOMEN PELVIS W CONTRAST    Standing Status:   Future    Standing Expiration Date:   08/17/2018    Order Specific Question:   If indicated for the ordered procedure, I authorize the administration of contrast media per Radiology protocol    Answer:   Yes    Order Specific Question:  Preferred imaging location?    Answer:   [facility]    Order Specific Question:   Radiology Contrast Protocol - do NOT remove file path    Answer:   [email or web address]    Order Specific Question:   Reason for Exam additional comments    Answer:   hx tongue ca, weight loss, epigastric pain, dysphagia  . CBC with Differential/Platelet    Standing Status:   Future    Standing Expiration Date:   10/17/2018  . Comprehensive metabolic panel    Standing Status:   Future    Standing Expiration Date:   10/17/2018  . Lactate dehydrogenase    Standing Status:   Future    Standing Expiration Date:   10/17/2018  . Uric acid    Standing Status:   Future    Standing Expiration Date:   10/17/2018  . Kappa/lambda light chains    Standing Status:   Future    Standing Expiration Date:   10/17/2018  . Multiple Myeloma Panel (SPEP&IFE w/QIG)    Standing Status:   Future    Standing Expiration Date:   10/17/2018  . UPEP/UIFE/Light Chains/TP, 24-Hr Ur    Standing Status:   Future    Standing Expiration Date:   10/17/2018  . Beta 2 microglobulin, serum    Standing Status:   Future    Standing Expiration Date:   10/17/2018  . TSH    Standing Status:   Future    Standing Expiration Date:   09/22/2018   All questions were answered. The patient knows to call the clinic with any problems, questions or concerns. No barriers to learning was detected. I spent 40 minutes counseling the patient face to face. The total time  spent in the appointment was 55 minutes and more than 50% was on counseling and review of test results     Heath Lark, MD 08/18/2017 5:42 PM

## 2017-08-18 NOTE — Assessment & Plan Note (Signed)
The patient had acquired hypothyroidism With recent dizziness, abnormal changes in appetite and weight loss, I recommend repeat TSH evaluation and he agreed to proceed

## 2017-08-18 NOTE — Assessment & Plan Note (Signed)
The patient has progressive dysphagia and early satiety The patient had extensive surgical scarring related related to prior feeding tube placement He had repeat upper endoscopy and esophageal dilation in the past He had progressive weight loss because of this I recommend CT scan for further evaluation to exclude malignancy and he agreed

## 2017-08-18 NOTE — Assessment & Plan Note (Signed)
The patient has been cancer free from the standpoint of tongue cancer No further workup is needed from the standpoint

## 2017-08-18 NOTE — Assessment & Plan Note (Signed)
He has chronic peripheral neuropathy, thought to be related to prior chemotherapy exposure Serum vitamin B12 was checked and it was within normal limits He is only taking low-dose gabapentin I recommend increasing the dose of gabapentin to 300 mg twice a day to see if this could control his symptoms better I did warn him about risk of sedation and constipation with this

## 2017-08-18 NOTE — Assessment & Plan Note (Signed)
He had recent extensive evaluation for neuropathy and dizziness Further workup from neurologist revealed MGUS We discussed the natural history of MGUS The patient has baseline peripheral neuropathy from prior chemotherapy exposure Certainly, concurrent neuropathy from plasma cell disorder cannot be excluded There is possibility of concern related to amyloidosis I plan to order evaluation with blood work, 24-hour urine collection and skeletal survey

## 2017-08-18 NOTE — Assessment & Plan Note (Signed)
The patient has been complaining of chronic dizziness He had several episodes of near fall experience but denies any falls or injuries He had extensive cardiac evaluation recently he has decline in performance status and is unable to run due to reduced stamina and shortness of breath on moderate exertion His symptoms are vague He had MRI of the brain which was unremarkable for intracranial pathology He had extensive neurology evaluation I told him that I might not be able to find conclusive cause of his dizziness but I think it would be prudent to exclude plasma cell disorder such as amyloidosis that could present with vague symptoms

## 2017-08-19 DIAGNOSIS — H2511 Age-related nuclear cataract, right eye: Secondary | ICD-10-CM | POA: Diagnosis not present

## 2017-08-19 DIAGNOSIS — H25811 Combined forms of age-related cataract, right eye: Secondary | ICD-10-CM | POA: Diagnosis not present

## 2017-08-21 ENCOUNTER — Ambulatory Visit (HOSPITAL_COMMUNITY)
Admission: RE | Admit: 2017-08-21 | Discharge: 2017-08-21 | Disposition: A | Discharge status: Home / Self Care | Payer: Medicare Other | Source: Ambulatory Visit | Attending: Hematology and Oncology | Admitting: Hematology and Oncology

## 2017-08-21 ENCOUNTER — Other Ambulatory Visit (HOSPITAL_BASED_OUTPATIENT_CLINIC_OR_DEPARTMENT_OTHER): Payer: Medicare Other

## 2017-08-21 ENCOUNTER — Other Ambulatory Visit: Payer: Medicare Other

## 2017-08-21 DIAGNOSIS — R6881 Early satiety: Secondary | ICD-10-CM

## 2017-08-21 DIAGNOSIS — D472 Monoclonal gammopathy: Secondary | ICD-10-CM

## 2017-08-21 DIAGNOSIS — K5900 Constipation, unspecified: Secondary | ICD-10-CM

## 2017-08-21 DIAGNOSIS — R1013 Epigastric pain: Secondary | ICD-10-CM | POA: Diagnosis not present

## 2017-08-21 DIAGNOSIS — R1310 Dysphagia, unspecified: Secondary | ICD-10-CM | POA: Diagnosis not present

## 2017-08-21 DIAGNOSIS — E039 Hypothyroidism, unspecified: Secondary | ICD-10-CM | POA: Diagnosis not present

## 2017-08-21 DIAGNOSIS — C01 Malignant neoplasm of base of tongue: Secondary | ICD-10-CM | POA: Diagnosis not present

## 2017-08-21 DIAGNOSIS — I2510 Atherosclerotic heart disease of native coronary artery without angina pectoris: Secondary | ICD-10-CM | POA: Insufficient documentation

## 2017-08-21 DIAGNOSIS — R1319 Other dysphagia: Secondary | ICD-10-CM

## 2017-08-21 DIAGNOSIS — I739 Peripheral vascular disease, unspecified: Secondary | ICD-10-CM | POA: Diagnosis not present

## 2017-08-21 DIAGNOSIS — E038 Other specified hypothyroidism: Secondary | ICD-10-CM

## 2017-08-21 DIAGNOSIS — I700 Atherosclerosis of aorta: Secondary | ICD-10-CM | POA: Insufficient documentation

## 2017-08-21 DIAGNOSIS — Z85810 Personal history of malignant neoplasm of tongue: Secondary | ICD-10-CM | POA: Diagnosis present

## 2017-08-21 LAB — CBC WITH DIFFERENTIAL/PLATELET
BASO%: 0.2 % (ref 0.0–2.0)
Basophils Absolute: 0 10*3/uL (ref 0.0–0.1)
EOS ABS: 0.2 10*3/uL (ref 0.0–0.5)
EOS%: 2.5 % (ref 0.0–7.0)
HEMATOCRIT: 40.7 % (ref 38.4–49.9)
HGB: 13.6 g/dL (ref 13.0–17.1)
LYMPH#: 1.5 10*3/uL (ref 0.9–3.3)
LYMPH%: 23.9 % (ref 14.0–49.0)
MCH: 32.2 pg (ref 27.2–33.4)
MCHC: 33.4 g/dL (ref 32.0–36.0)
MCV: 96.2 fL (ref 79.3–98.0)
MONO#: 0.7 10*3/uL (ref 0.1–0.9)
MONO%: 10.5 % (ref 0.0–14.0)
NEUT%: 62.9 % (ref 39.0–75.0)
NEUTROS ABS: 4 10*3/uL (ref 1.5–6.5)
PLATELETS: 138 10*3/uL — AB (ref 140–400)
RBC: 4.23 10*6/uL (ref 4.20–5.82)
RDW: 13.5 % (ref 11.0–14.6)
WBC: 6.4 10*3/uL (ref 4.0–10.3)

## 2017-08-21 LAB — COMPREHENSIVE METABOLIC PANEL
ALBUMIN: 4.2 g/dL (ref 3.5–5.0)
ALK PHOS: 40 U/L (ref 40–150)
ALT: 19 U/L (ref 0–55)
ANION GAP: 9 meq/L (ref 3–11)
AST: 23 U/L (ref 5–34)
BILIRUBIN TOTAL: 0.26 mg/dL (ref 0.20–1.20)
BUN: 33.9 mg/dL — ABNORMAL HIGH (ref 7.0–26.0)
CALCIUM: 10 mg/dL (ref 8.4–10.4)
CO2: 25 meq/L (ref 22–29)
CREATININE: 1.6 mg/dL — AB (ref 0.7–1.3)
Chloride: 106 mEq/L (ref 98–109)
EGFR: 41 mL/min/{1.73_m2} — ABNORMAL LOW (ref >60)
Glucose: 92 mg/dl (ref 70–140)
Potassium: 5.1 mEq/L (ref 3.5–5.1)
Sodium: 140 mEq/L (ref 136–145)
TOTAL PROTEIN: 8.5 g/dL — AB (ref 6.4–8.3)

## 2017-08-21 LAB — TSH: TSH: 2.22 m(IU)/L (ref 0.320–4.118)

## 2017-08-21 LAB — URIC ACID: URIC ACID, SERUM: 6.5 mg/dL (ref 2.6–7.4)

## 2017-08-21 LAB — LACTATE DEHYDROGENASE: LDH: 203 U/L (ref 125–245)

## 2017-08-21 MED ORDER — IOPAMIDOL (ISOVUE-300) INJECTION 61%
100.0000 mL | Freq: Once | INTRAVENOUS | Status: AC | PRN
  Administered 2017-08-21: 80 mL via INTRAVENOUS

## 2017-08-21 MED ORDER — IOPAMIDOL (ISOVUE-300) INJECTION 61%
INTRAVENOUS | Status: AC
  Filled 2017-08-21: qty 100

## 2017-08-22 LAB — BETA 2 MICROGLOBULIN, SERUM: Beta-2: 2.9 mg/L — ABNORMAL HIGH (ref 0.6–2.4)

## 2017-08-24 LAB — KAPPA/LAMBDA LIGHT CHAINS
IG KAPPA FREE LIGHT CHAIN: 30.6 mg/L — AB (ref 3.3–19.4)
Ig Lambda Free Light Chain: 32.2 mg/L — ABNORMAL HIGH (ref 5.7–26.3)
KAPPA/LAMBDA FLC RATIO: 0.95 (ref 0.26–1.65)

## 2017-08-24 LAB — UPEP/UIFE/LIGHT CHAINS/TP, 24-HR UR
% BETA, URINE: 0 %
ALBUMIN, U: 0 %
ALPHA 1 URINE: 0 %
ALPHA-2-GLOBULIN, U: 0 %
Free Kappa Lt Chains,Ur: 11.2 mg/L (ref 1.35–24.19)
Free Lambda Lt Chains,Ur: 1.39 mg/L (ref 0.24–6.66)
GAMMA GLOBULIN URINE: 0 %
KAPPA/LAMBDA RATIO, U: 8.06 (ref 2.04–10.37)
Prot,24hr calculated: <150 mg/24 hr (ref 30–150)

## 2017-08-25 DIAGNOSIS — H2512 Age-related nuclear cataract, left eye: Secondary | ICD-10-CM | POA: Diagnosis not present

## 2017-08-25 DIAGNOSIS — H25012 Cortical age-related cataract, left eye: Secondary | ICD-10-CM | POA: Diagnosis not present

## 2017-08-25 DIAGNOSIS — H25042 Posterior subcapsular polar age-related cataract, left eye: Secondary | ICD-10-CM | POA: Diagnosis not present

## 2017-08-25 LAB — MULTIPLE MYELOMA PANEL, SERUM
ALBUMIN SERPL ELPH-MCNC: 3.6 g/dL (ref 2.9–4.4)
ALBUMIN/GLOB SERPL: 1 (ref 0.7–1.7)
Alpha 1: 0.3 g/dL (ref 0.0–0.4)
Alpha2 Glob SerPl Elph-Mcnc: 1.1 g/dL — ABNORMAL HIGH (ref 0.4–1.0)
B-GLOBULIN SERPL ELPH-MCNC: 1.1 g/dL (ref 0.7–1.3)
GAMMA GLOB SERPL ELPH-MCNC: 1.4 g/dL (ref 0.4–1.8)
Globulin, Total: 3.9 g/dL (ref 2.2–3.9)
IgA, Qn, Serum: 148 mg/dL (ref 61–437)
IgM, Qn, Serum: 76 mg/dL (ref 15–143)
M Protein SerPl Elph-Mcnc: 0.8 g/dL — ABNORMAL HIGH
Total Protein: 7.5 g/dL (ref 6.0–8.5)

## 2017-08-28 ENCOUNTER — Ambulatory Visit (HOSPITAL_BASED_OUTPATIENT_CLINIC_OR_DEPARTMENT_OTHER): Payer: Medicare Other | Admitting: Hematology and Oncology

## 2017-08-28 ENCOUNTER — Telehealth: Payer: Self-pay | Admitting: Hematology and Oncology

## 2017-08-28 ENCOUNTER — Encounter: Payer: Self-pay | Admitting: Hematology and Oncology

## 2017-08-28 DIAGNOSIS — G620 Drug-induced polyneuropathy: Secondary | ICD-10-CM

## 2017-08-28 DIAGNOSIS — D472 Monoclonal gammopathy: Secondary | ICD-10-CM

## 2017-08-28 DIAGNOSIS — T451X5A Adverse effect of antineoplastic and immunosuppressive drugs, initial encounter: Secondary | ICD-10-CM

## 2017-08-28 DIAGNOSIS — D696 Thrombocytopenia, unspecified: Secondary | ICD-10-CM | POA: Diagnosis not present

## 2017-08-28 DIAGNOSIS — R1310 Dysphagia, unspecified: Secondary | ICD-10-CM

## 2017-08-28 DIAGNOSIS — R42 Dizziness and giddiness: Secondary | ICD-10-CM | POA: Diagnosis not present

## 2017-08-28 MED ORDER — GABAPENTIN 300 MG PO CAPS: 300.0000 mg | ORAL_CAPSULE | Freq: Three times a day (TID) | ORAL | 1 refills | Status: DC

## 2017-08-28 NOTE — Assessment & Plan Note (Signed)
The cause could be related to his underlying medical condition. It is mild and there is little change compared from previous platelet count. The patient denies recent history of bleeding such as epistaxis, hematuria or hematochezia. He is asymptomatic from the thrombocytopenia. I will observe for now.

## 2017-08-28 NOTE — Assessment & Plan Note (Signed)
He has symptoms of dizziness Prior evaluation suggested postural hypotension According to his wife, he has great fluctuation of blood pressure I recommend he checks his blood pressure 3 times a day and document it in his diary We will discuss further management in his next visit

## 2017-08-28 NOTE — Assessment & Plan Note (Signed)
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 Since I increased the dose of gabapentin to 300 mg twice a day, the pain control is much improved I refilled his prescription and will consider increasing the dose to 300 mg 3 times a day

## 2017-08-28 NOTE — Telephone Encounter (Signed)
Gave avs and calendar for January 2019 °

## 2017-08-28 NOTE — Assessment & Plan Note (Signed)
Clinically, he has no signs of end organ damage He had chronic kidney disease, stable, unrelated For that, I recommend once a year visit We discussed the natural history of MGUS

## 2017-08-28 NOTE — Progress Notes (Signed)
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 PET/CT scan showed no evidence of distant metastatic disease however he does have evidence of lymphadenopathy. He was offered concurrent chemoradiation therapy with weekly cisplatin between August to October 2012. From 2012 to 2014 he had serial imaging studies which show no evidence of disease recurrence. 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 He returns for further follow-up He continues to have dizziness He continues to have mild swallowing difficulties but have not lost any weight The neuropathic pain around the epigastric region is improved with increased dose of gabapentin twice a day He denies sedation or constipation  REVIEW OF SYSTEMS:   Constitutional: Denies fevers, chills or abnormal weight loss Eyes: Denies blurriness of vision Ears, nose, mouth, throat, and face: Denies mucositis or sore throat Respiratory: Denies cough, dyspnea or wheezes Cardiovascular: Denies palpitation, chest discomfort or lower extremity swelling Gastrointestinal:  Denies nausea, heartburn or change in bowel habits Skin: Denies abnormal skin rashes Lymphatics: Denies new lymphadenopathy or easy bruising Neurological:Denies numbness, tingling or new weaknesses Behavioral/Psych: Mood is stable, no new changes  All other systems were reviewed with the  patient and are negative.  I have reviewed the past medical history, past surgical history, social history and family history with the patient and they are unchanged from previous note.  ALLERGIES:  is allergic to zetia [ezetimibe].  MEDICATIONS:  Current Outpatient Medications  Medication Sig Dispense Refill  . aspirin EC 81 MG tablet Take 1 tablet (81 mg total) by mouth at bedtime.    . gabapentin (NEURONTIN) 300 MG capsule Take 1 capsule (300 mg total) by mouth 3 (three) times daily. 90 capsule 1  . levothyroxine (SYNTHROID, LEVOTHROID) 100 MCG tablet TAKE ONE TABLET BY MOUTH ONCE DAILY BEFORE  BREAKFAST 90 tablet 0  . LORazepam (ATIVAN) 1 MG tablet TAKE 1 TABLET BY MOUTH THREE TIMES DAILY 270 tablet 0  . Multiple Vitamin (MULTIVITAMIN WITH MINERALS) TABS tablet Take 1 tablet by mouth daily.    . pantoprazole (PROTONIX) 40 MG tablet TAKE 1 TABLET BY MOUTH EVERY DAY BEFORE BREAKFAST 30 tablet 11  . pilocarpine (SALAGEN) 7.5 MG tablet TAKE 1 TABLET BY MOUTH THREE TIMES DAILY 270 tablet 3  . polyethylene glycol (MIRALAX / GLYCOLAX) packet Take 17 g by mouth daily as needed for mild constipation.     . rosuvastatin (CRESTOR) 40 MG tablet Take 1 tablet (40 mg total) by mouth at bedtime. 90 tablet 3  . vitamin B-12 (CYANOCOBALAMIN) 1000 MCG tablet Take 1,000 mcg by mouth daily.    . zolpidem (AMBIEN) 10 MG tablet Take 10 mg by mouth at bedtime as needed for sleep. Reported on 03/26/2016     No current facility-administered medications for this visit.     PHYSICAL EXAMINATION: ECOG PERFORMANCE STATUS: 1 - Symptomatic but completely ambulatory  Vitals:   08/28/17 1452  BP: 130/66  Pulse: 62  Resp: 20  Temp: (!) 97.5 F (36.4 C)  SpO2:   98%   Filed Weights   08/28/17 1452  Weight: 190 lb 4.8 oz (86.3 kg)    GENERAL:alert, no distress and comfortable SKIN: skin color, texture, turgor are normal, no rashes or significant lesions EYES: normal, Conjunctiva are pink and non-injected,  sclera clear Musculoskeletal:no cyanosis of digits and no clubbing  NEURO: alert & oriented x 3 with fluent speech, no focal motor/sensory deficits  LABORATORY DATA:  I have reviewed the data as listed    Component Value Date/Time   NA 140 08/21/2017 0952   K 5.1 08/21/2017 0952   CL 106 04/07/2017 1027   CL 106 11/25/2012 0812   CO2 25 08/21/2017 0952   GLUCOSE 92 08/21/2017 0952   GLUCOSE 102 (H) 11/25/2012 0812   BUN 33.9 (H) 08/21/2017 0952   CREATININE 1.6 (H) 08/21/2017 0952   CALCIUM 10.0 08/21/2017 0952   PROT 8.5 (H) 08/21/2017 0952   PROT 7.5 08/21/2017 0952   ALBUMIN 4.2 08/21/2017 0952   AST 23 08/21/2017 0952   ALT 19 08/21/2017 0952   ALKPHOS 40 08/21/2017 0952   BILITOT 0.26 08/21/2017 0952   GFRNONAA 46 (L) 04/07/2017 1027   GFRAA 53 (L) 04/07/2017 1027    No results found for: SPEP, UPEP  Lab Results  Component Value Date   WBC 6.4 08/21/2017   NEUTROABS 4.0 08/21/2017   HGB 13.6 08/21/2017   HCT 40.7 08/21/2017   MCV 96.2 08/21/2017   PLT 138 (L) 08/21/2017      Chemistry      Component Value Date/Time   NA 140 08/21/2017 0952   K 5.1 08/21/2017 0952   CL 106 04/07/2017 1027   CL 106 11/25/2012 0812   CO2 25 08/21/2017 0952   BUN 33.9 (H) 08/21/2017 0952   CREATININE 1.6 (H) 08/21/2017 0952      Component Value Date/Time   CALCIUM 10.0 08/21/2017 0952   ALKPHOS 40 08/21/2017 0952   AST 23 08/21/2017 0952   ALT 19 08/21/2017 0952   BILITOT 0.26 08/21/2017 0952       RADIOGRAPHIC STUDIES: I reviewed CT imaging with him I have personally reviewed the radiological images as listed and agreed with the findings in the report. Ct Abdomen Pelvis W Contrast  Result Date: 08/21/2017 CLINICAL DATA:  History of tongue cancer. Reported weight loss, dysphagia, early satiety and epigastric abdominal pain. History of esophageal dilation. History of prior feeding tube. EXAM: CT ABDOMEN AND PELVIS WITH CONTRAST TECHNIQUE: Multidetector CT imaging of  the abdomen and pelvis was performed using the standard protocol following bolus administration of intravenous contrast. CONTRAST:  13m ISOVUE-300 IOPAMIDOL (ISOVUE-300) INJECTION 61% COMPARISON:  08/17/2012 CT abdomen/ pelvis. FINDINGS: Lower chest: No significant pulmonary nodules or acute consolidative airspace disease. Visualized lower sternotomy wires appear intact. Coronary atherosclerosis. Hepatobiliary: Normal liver with no liver mass. Normal gallbladder with no radiopaque cholelithiasis. No biliary ductal dilatation. Pancreas: Normal, with no mass or duct dilation. Spleen: Normal size. No mass. Adrenals/Urinary Tract: Normal adrenals. No hydronephrosis. Simple 1.2 cm posterior interpolar right renal cyst. No additional renal lesions. Normal bladder. Stomach/Bowel: Nondistended unremarkable stomach. Normal caliber small bowel with no small bowel wall thickening. Normal appendix. Normal large bowel with no diverticulosis, large bowel wall thickening or pericolonic fat stranding. Vascular/Lymphatic: Atherosclerotic nonaneurysmal abdominal aorta. Patent portal, splenic, hepatic and renal veins. No pathologically enlarged lymph nodes in the abdomen or pelvis. Reproductive: Normal size prostate. Other: No pneumoperitoneum, ascites or focal fluid collection. Musculoskeletal: No aggressive appearing focal osseous lesions. Moderate thoracolumbar  spondylosis. IMPRESSION: 1. No acute abnormality. No evidence of bowel obstruction or acute bowel inflammation. Normal appendix. 2. No evidence of metastatic disease in the abdomen or pelvis. 3.  Aortic Atherosclerosis (ICD10-I70.0).  Coronary atherosclerosis. Electronically Signed   By: Jason A Poff M.D.   On: 08/21/2017 15:14   Dg Bone Survey Met  Result Date: 08/21/2017 CLINICAL DATA:  76-year-old male with history of treated oropharyngeal squamous cell carcinoma. MGUS (monoclonal gammopathy of unknown significance). Epigastric pain and constipation. EXAM:  METASTATIC BONE SURVEY COMPARISON:  PET-CT 10/01/2011.  Neck CT 06/08/2013. FINDINGS: Calvarium bone mineralization is within normal limits. Cervical spine bone mineralization and alignment within normal limits. Multilevel cervical facet hypertrophy. Mild lower cervical endplate spurring. Cervicothoracic junction alignment is within normal limits. Flowing osteophytes throughout the thoracic spine. Thoracic bone mineralization within normal limits. Mildly exaggerated thoracic kyphosis. Normal thoracic and lumbar segmentation. Lumbar bone mineralization within normal limits. Grade 1 anterolisthesis of L4 on L5. Preserved lumbar disc spaces. Mild lower lumbar endplate spurring and moderate facet hypertrophy. Pelvis bone mineralization within normal limits. Rib bone mineralization appears normal. Prior sternotomy. Thoracic, abdominal, and pelvic visceral contours are within normal limits. Bilateral upper and lower extremity bone mineralization is within normal limits. There is calcified peripheral vascular disease, more apparent in the upper extremities. There is glenohumeral joint space loss with superior subluxation of both humeral heads. Left humeral head degenerative spurring greater on the left. IMPRESSION: 1. Bone mineralization within normal limits. No lytic or suspicious osseous lesions identified. 2. Calcified peripheral vascular disease, more apparent in the upper extremities. 3. Degenerative changes at both shoulders and in the lumbar spine. Electronically Signed   By: H  Hall M.D.   On: 08/21/2017 15:03    ASSESSMENT & PLAN:  MGUS (monoclonal gammopathy of unknown significance) Clinically, he has no signs of end organ damage He had chronic kidney disease, stable, unrelated For that, I recommend once a year visit We discussed the natural history of MGUS  Thrombocytopenia The cause could be related to his underlying medical condition. It is mild and there is little change compared from previous  platelet count. The patient denies recent history of bleeding such as epistaxis, hematuria or hematochezia. He is asymptomatic from the thrombocytopenia. I will observe for now.   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  All questions were answered. The patient knows to call the clinic with any problems, questions or concerns. No barriers to learning was detected. I spent 15 minutes counseling the patient face to face. The total time spent in the appointment was 20 minutes and more than 50% was on counseling and review of test results      , MD 08/28/2017 3:37 PM  

## 2017-08-28 NOTE — Assessment & Plan Note (Signed)
He has chronic dysphagia likely related to esophageal stricture I recommend consideration for GI evaluation and dilation

## 2017-09-02 DIAGNOSIS — H25812 Combined forms of age-related cataract, left eye: Secondary | ICD-10-CM | POA: Diagnosis not present

## 2017-09-02 DIAGNOSIS — H2512 Age-related nuclear cataract, left eye: Secondary | ICD-10-CM | POA: Diagnosis not present

## 2017-09-08 ENCOUNTER — Other Ambulatory Visit (INDEPENDENT_AMBULATORY_CARE_PROVIDER_SITE_OTHER): Payer: Self-pay | Admitting: Internal Medicine

## 2017-09-08 DIAGNOSIS — R1013 Epigastric pain: Principal | ICD-10-CM

## 2017-09-08 DIAGNOSIS — G8929 Other chronic pain: Secondary | ICD-10-CM

## 2017-09-28 ENCOUNTER — Encounter: Payer: Self-pay | Admitting: Hematology and Oncology

## 2017-09-28 ENCOUNTER — Inpatient Hospital Stay: Payer: Medicare Other | Attending: Hematology and Oncology | Admitting: Hematology and Oncology

## 2017-09-28 ENCOUNTER — Telehealth: Payer: Self-pay | Admitting: Hematology and Oncology

## 2017-09-28 DIAGNOSIS — G620 Drug-induced polyneuropathy: Secondary | ICD-10-CM | POA: Diagnosis not present

## 2017-09-28 DIAGNOSIS — T451X5D Adverse effect of antineoplastic and immunosuppressive drugs, subsequent encounter: Secondary | ICD-10-CM

## 2017-09-28 DIAGNOSIS — D472 Monoclonal gammopathy: Secondary | ICD-10-CM | POA: Diagnosis not present

## 2017-09-28 DIAGNOSIS — T451X5A Adverse effect of antineoplastic and immunosuppressive drugs, initial encounter: Secondary | ICD-10-CM

## 2017-09-28 DIAGNOSIS — Z85810 Personal history of malignant neoplasm of tongue: Secondary | ICD-10-CM

## 2017-09-28 DIAGNOSIS — N189 Chronic kidney disease, unspecified: Secondary | ICD-10-CM

## 2017-09-28 DIAGNOSIS — R42 Dizziness and giddiness: Secondary | ICD-10-CM | POA: Diagnosis not present

## 2017-09-28 NOTE — Assessment & Plan Note (Signed)
The patient has been cancer free from the standpoint of tongue cancer No further workup is needed from the standpoint

## 2017-09-28 NOTE — Progress Notes (Signed)
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 PET/CT scan showed no evidence of distant metastatic disease however he does have evidence of lymphadenopathy. He was offered concurrent chemoradiation therapy with weekly cisplatin between August to October 2012. From 2012 to 2014 he had serial imaging studies which show no evidence of disease recurrence. 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  He is being observed  INTERVAL HISTORY: Please see below for problem oriented charting. He is doing very well With increased dose of gabapentin, he is eating well, gaining weight and is able to exercise significantly without pain He had no side effects from the increased dose of gabapentin He continued to have postural dizziness He showed me documentation of his blood pressure over the past month which show significant fluctuation of his blood pressure He denies recent infection  REVIEW OF SYSTEMS:   Constitutional: Denies fevers, chills or abnormal weight loss Eyes: Denies blurriness of vision Ears, nose, mouth, throat, and face: Denies mucositis or sore throat Respiratory: Denies cough, dyspnea or wheezes Cardiovascular: Denies palpitation, chest discomfort or lower extremity swelling Gastrointestinal:  Denies nausea, heartburn or change in bowel habits Skin: Denies abnormal skin rashes Lymphatics: Denies new lymphadenopathy or easy  bruising Neurological:Denies numbness, tingling or new weaknesses Behavioral/Psych: Mood is stable, no new changes  All other systems were reviewed with the patient and are negative.  I have reviewed the past medical history, past surgical history, social history and family history with the patient and they are unchanged from previous note.  ALLERGIES:  is allergic to zetia [ezetimibe].  MEDICATIONS:  Current Outpatient Medications  Medication Sig Dispense Refill  . aspirin EC 81 MG tablet Take 1 tablet (81 mg total) by mouth at bedtime.    . gabapentin (NEURONTIN) 300 MG capsule Take 1 capsule (300 mg total) by mouth 3 (three) times daily. 90 capsule 1  . levothyroxine (SYNTHROID, LEVOTHROID) 100 MCG tablet TAKE ONE TABLET BY MOUTH ONCE DAILY BEFORE  BREAKFAST 90 tablet 0  . LORazepam (ATIVAN) 1 MG tablet TAKE 1 TABLET BY MOUTH THREE TIMES DAILY 270 tablet 0  . Multiple Vitamin (MULTIVITAMIN WITH MINERALS) TABS tablet Take 1 tablet by mouth daily.    . pantoprazole (PROTONIX) 40 MG tablet TAKE 1 TABLET BY MOUTH EVERY DAY BEFORE BREAKFAST 30 tablet 11  . pilocarpine (SALAGEN) 7.5 MG tablet TAKE 1 TABLET BY MOUTH THREE TIMES DAILY 270 tablet 3  . polyethylene glycol (MIRALAX / GLYCOLAX) packet Take 17 g by mouth daily as needed for mild constipation.     . rosuvastatin (CRESTOR) 40 MG tablet Take 1 tablet (40 mg total) by mouth at bedtime. 90 tablet 3  . vitamin B-12 (CYANOCOBALAMIN) 1000 MCG tablet Take 1,000 mcg by mouth daily.    Marland Kitchen zolpidem (AMBIEN) 10 MG tablet Take 10 mg by mouth at bedtime as needed for sleep. Reported on 03/26/2016     No current facility-administered medications for this visit.     PHYSICAL EXAMINATION: ECOG PERFORMANCE STATUS: 1 -  Symptomatic but completely ambulatory  Vitals:   09/28/17 1233  BP: (!) 146/74  Pulse: (!) 54  Resp: 18  Temp: 98.1 F (36.7 C)  SpO2: 99%   Filed Weights   09/28/17 1233  Weight: 191 lb 8 oz (86.9 kg)    GENERAL:alert, no  distress and comfortable SKIN: skin color, texture, turgor are normal, no rashes or significant lesions EYES: normal, Conjunctiva are pink and non-injected, sclera clear Musculoskeletal:no cyanosis of digits and no clubbing  NEURO: alert & oriented x 3 with fluent speech, no focal motor/sensory deficits  LABORATORY DATA:  I have reviewed the data as listed    Component Value Date/Time   NA 140 08/21/2017 0952   K 5.1 08/21/2017 0952   CL 106 04/07/2017 1027   CL 106 11/25/2012 0812   CO2 25 08/21/2017 0952   GLUCOSE 92 08/21/2017 0952   GLUCOSE 102 (H) 11/25/2012 0812   BUN 33.9 (H) 08/21/2017 0952   CREATININE 1.6 (H) 08/21/2017 0952   CALCIUM 10.0 08/21/2017 0952   PROT 8.5 (H) 08/21/2017 0952   PROT 7.5 08/21/2017 0952   ALBUMIN 4.2 08/21/2017 0952   AST 23 08/21/2017 0952   ALT 19 08/21/2017 0952   ALKPHOS 40 08/21/2017 0952   BILITOT 0.26 08/21/2017 0952   GFRNONAA 46 (L) 04/07/2017 1027   GFRAA 53 (L) 04/07/2017 1027    No results found for: SPEP, UPEP  Lab Results  Component Value Date   WBC 6.4 08/21/2017   NEUTROABS 4.0 08/21/2017   HGB 13.6 08/21/2017   HCT 40.7 08/21/2017   MCV 96.2 08/21/2017   PLT 138 (L) 08/21/2017      Chemistry      Component Value Date/Time   NA 140 08/21/2017 0952   K 5.1 08/21/2017 0952   CL 106 04/07/2017 1027   CL 106 11/25/2012 0812   CO2 25 08/21/2017 0952   BUN 33.9 (H) 08/21/2017 0952   CREATININE 1.6 (H) 08/21/2017 0952      Component Value Date/Time   CALCIUM 10.0 08/21/2017 0952   ALKPHOS 40 08/21/2017 0952   AST 23 08/21/2017 0952   ALT 19 08/21/2017 0952   BILITOT 0.26 08/21/2017 0952       ASSESSMENT & PLAN:  History of tongue cancer The patient has been cancer free from the standpoint of tongue cancer No further workup is needed from the standpoint  MGUS (monoclonal gammopathy of unknown significance) Clinically, he has no signs of end organ damage He had chronic kidney disease, stable,  unrelated For that, I recommend once a year visit We discussed the natural history of MGUS I plan to see him back in 6 months with history, physical examination and blood work to be done a week prior to his appointment  Neuropathy due to chemotherapeutic drug ([place]) 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 Since I had increase his gabapentin to 3 times a day, he is able to function well He is eating better, able to exercise extensively without pain and is gaining weight He will continue current dose indefinitely He has no side effects from gabapentin so far  Dizziness He has symptoms of dizziness Prior evaluation suggested postural hypotension According to his wife, he has great fluctuation of blood pressure I recommend he checks his blood pressure 3 times a day and document it in his diary On evaluation of his recent documentation of blood pressure, he has significant, wide fluctuation of his blood pressure from within  normal limits to as high as 198/99 He would discuss further management with his cardiologist.   No orders of the defined types were placed in this encounter.  All questions were answered. The patient knows to call the clinic with any problems, questions or concerns. No barriers to learning was detected. I spent 15 minutes counseling the patient face to face. The total time spent in the appointment was 20 minutes and more than 50% was on counseling and review of test results     Heath Lark, MD 09/28/2017 4:54 PM

## 2017-09-28 NOTE — Assessment & Plan Note (Signed)
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 

## 2017-09-28 NOTE — Assessment & Plan Note (Addendum)
He has symptoms of dizziness Prior evaluation suggested postural hypotension According to his wife, he has great fluctuation of blood pressure I recommend he checks his blood pressure 3 times a day and document it in his diary On evaluation of his recent documentation of blood pressure, he has significant, wide fluctuation of his blood pressure from within normal limits to as high as 198/99 He would discuss further management with his cardiologist.

## 2017-09-28 NOTE — Assessment & Plan Note (Signed)
Clinically, he has no signs of end organ damage He had chronic kidney disease, stable, unrelated For that, I recommend once a year visit We discussed the natural history of MGUS I plan to see him back in 6 months with history, physical examination and blood work to be done a week prior to his appointment

## 2017-09-28 NOTE — Telephone Encounter (Signed)
Gave patient avs and calendar with appts per 1/7 los.  °

## 2017-11-04 DIAGNOSIS — Z8589 Personal history of malignant neoplasm of other organs and systems: Secondary | ICD-10-CM | POA: Diagnosis not present

## 2017-11-04 DIAGNOSIS — J320 Chronic maxillary sinusitis: Secondary | ICD-10-CM | POA: Diagnosis not present

## 2017-11-04 DIAGNOSIS — H90A22 Sensorineural hearing loss, unilateral, left ear, with restricted hearing on the contralateral side: Secondary | ICD-10-CM | POA: Diagnosis not present

## 2017-11-18 ENCOUNTER — Other Ambulatory Visit: Payer: Self-pay | Admitting: Hematology and Oncology

## 2017-12-04 ENCOUNTER — Other Ambulatory Visit: Payer: Self-pay

## 2017-12-04 ENCOUNTER — Telehealth: Payer: Self-pay

## 2017-12-04 MED ORDER — GABAPENTIN 300 MG PO CAPS: ORAL_CAPSULE | ORAL | 2 refills | Status: DC

## 2017-12-04 NOTE — Telephone Encounter (Signed)
Called requesting 90 day supply.

## 2017-12-04 NOTE — Telephone Encounter (Signed)
Called and left message that Rx sent to pharmacy for 90 day supply.

## 2017-12-28 ENCOUNTER — Telehealth: Payer: Self-pay | Admitting: Cardiology

## 2017-12-28 ENCOUNTER — Other Ambulatory Visit (INDEPENDENT_AMBULATORY_CARE_PROVIDER_SITE_OTHER): Payer: Self-pay | Admitting: Internal Medicine

## 2017-12-28 DIAGNOSIS — G8929 Other chronic pain: Secondary | ICD-10-CM

## 2017-12-28 DIAGNOSIS — R1013 Epigastric pain: Principal | ICD-10-CM

## 2017-12-28 MED ORDER — ROSUVASTATIN CALCIUM 40 MG PO TABS: 40.0000 mg | ORAL_TABLET | Freq: Every day | ORAL | 0 refills | Status: DC

## 2017-12-28 NOTE — Telephone Encounter (Signed)
°*  STAT* If patient is at the pharmacy, call can be transferred to refill team.   1. Which medications need to be refilled? (please list name of each medication and dose if known) Rosuvastatin 40 mg   2. Which pharmacy/location (including street and city if local pharmacy) is medication to be sent to?[facility] on [address] in [place]   3. Do they need a 30 day or 90 day supply?90 with 3 refills

## 2018-01-20 ENCOUNTER — Ambulatory Visit: Payer: Medicare Other | Admitting: Neurology

## 2018-01-26 DIAGNOSIS — Z0000 Encounter for general adult medical examination without abnormal findings: Secondary | ICD-10-CM | POA: Diagnosis not present

## 2018-01-26 DIAGNOSIS — E7849 Other hyperlipidemia: Secondary | ICD-10-CM | POA: Diagnosis not present

## 2018-01-26 DIAGNOSIS — E038 Other specified hypothyroidism: Secondary | ICD-10-CM | POA: Diagnosis not present

## 2018-01-26 DIAGNOSIS — Z125 Encounter for screening for malignant neoplasm of prostate: Secondary | ICD-10-CM | POA: Diagnosis not present

## 2018-01-26 DIAGNOSIS — R82998 Other abnormal findings in urine: Secondary | ICD-10-CM | POA: Diagnosis not present

## 2018-02-01 NOTE — Progress Notes (Signed)
Subjective:   George Bradley is seen for follow up CAD.   He had coronary artery bypass grafting in 2000. Cardiac catheterization was in 2007 and showed patent saphenous vein grafts x3 with a patent left internal mammary artery and well-preserved LV function. His EF is 55%. His last nuclear stress test in January 2018 showed a small fixed apical defect with normal ejection fraction. No ischemia. Echo in April showed normal EF and mild AS. Due to progressive symptoms cardiac cath was repeated in July 2018 and all grafts were still patent with no changed since 2007.   He also has a history of orthostatic hypotension and has persistent lightheadedness. He does note that increase salt intake helps. He has been on no antihypertensive therapy. He's had a history of what sounds like a Maze procedure when he lived in [place]. His other problems include hyperlipidemia, insomnia, oral cancer, and a history of depression. 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 He has had esophageal dilitation several times in the past. He is followed by oncology with no recurrent tongue CA noted. 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   On follow up today he is doing well. Still notes orthostatic dizziness. At times he has to stop and bend over or squat for 10 seconds until the symptoms pass. No syncope. He has monitored BP at home and generally readings are good but at times he has significant fluctuation. No chest pain or dyspnea. He does sleep on 6 inch blocks.   Current Outpatient Medications  Medication Sig Dispense Refill  . aspirin EC 81 MG tablet Take 1 tablet (81 mg total) by mouth at bedtime.    . gabapentin (NEURONTIN) 300 MG capsule TAKE 1 CAPSULE(300 MG) BY MOUTH THREE TIMES DAILY 270 capsule 2  . levothyroxine (SYNTHROID, LEVOTHROID) 100 MCG tablet TAKE ONE TABLET BY MOUTH ONCE DAILY BEFORE  BREAKFAST 90 tablet 0  . LORazepam (ATIVAN) 1 MG tablet TAKE 1 TABLET BY MOUTH THREE TIMES DAILY 90  tablet 0  . Multiple Vitamin (MULTIVITAMIN WITH MINERALS) TABS tablet Take 1 tablet by mouth daily.    . pantoprazole (PROTONIX) 40 MG tablet TAKE 1 TABLET BY MOUTH EVERY DAY BEFORE BREAKFAST 30 tablet 11  . pilocarpine (SALAGEN) 7.5 MG tablet TAKE 1 TABLET BY MOUTH THREE TIMES DAILY 270 tablet 3  . polyethylene glycol (MIRALAX / GLYCOLAX) packet Take 17 g by mouth daily as needed for mild constipation.     . rosuvastatin (CRESTOR) 40 MG tablet Take 1 tablet (40 mg total) by mouth at bedtime. 90 tablet 0  . vitamin B-12 (CYANOCOBALAMIN) 1000 MCG tablet Take 1,000 mcg by mouth daily.    Marland Kitchen zolpidem (AMBIEN) 10 MG tablet Take 10 mg by mouth at bedtime as needed for sleep. Reported on 03/26/2016     No current facility-administered medications for this visit.     Allergies  Allergen Reactions  . Zetia [Ezetimibe] Other (See Comments)    "made me feel bad"    Patient Active Problem List   Diagnosis Date Noted  . Dizziness 08/18/2017  . 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 Constipation 03/23/2014  . Dysphagia 03/23/2014  . 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 Xerostomia 06/10/2013  . Thrombocytopenia ([place]) 06/10/2013  . S/P radiation therapy   . 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 Orthostatic hypotension 05/11/2012  . Epigastric pain 05/11/2012  . History of tongue cancer 11/17/2011  . Depression 10/01/2011  . Renal artery  stenosis, native, bilateral ([place]) 09/03/2011  . 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 Angina pectoris ([place]) 02/20/2011  . Hypercholesterolemia 02/20/2011  . Aortic valve stenosis, mild 02/20/2011    Social History   Tobacco Use  Smoking Status Former Smoker  . Years: 2.00  . Types: Cigars  . Last attempt to quit: 09/21/2009  . Years since quitting: 8.3  Smokeless Tobacco Former Systems developer  Tobacco Comment   2 cigars a week    Social History   Substance and Sexual Activity  Alcohol Use Yes  . Alcohol/week:  0.0 oz   Comment: rare  wine    Family History  Problem Relation Age of Onset  . Heart disease Father   . Heart disease Brother   . Heart disease Sister   . Cancer Sister        breast cancer  . Cancer Mother        breast ca  . Dementia Mother   . Hyperlipidemia Son     Review of Systems:   As noted in history of present illness.  All other systems were reviewed and are negative.   Physical Exam:    BP (!) 100/58   Pulse (!) 53   Ht 6\' 1"  (1.854 m)   Wt 188 lb 3.2 oz (85.4 kg)   BMI 24.83 kg/m    GENERAL:  Well appearing thin WM in NAD HEENT:  PERRL, EOMI, sclera are clear. Oropharynx is clear. NECK:  No jugular venous distention, carotid upstroke brisk and symmetric, no bruits, no thyromegaly or adenopathy LUNGS:  Clear to auscultation bilaterally CHEST:  Unremarkable HEART:  RRR,  PMI not displaced or sustained,S1 and S2 within normal limits, no S3, no S4: no clicks, no rubs, gr 2/6 harsh systolic murmur. ABD:  Soft, nontender. BS +, no masses or bruits. No hepatomegaly, no splenomegaly EXT:  2 + pulses throughout, no edema, no cyanosis no clubbing SKIN:  Warm and dry.  No rashes NEURO:  Alert and oriented x 3. Cranial nerves II through XII intact. PSYCH:  Cognitively intact    Laboratory data:  Lab Results  Component Value Date   WBC 6.4 08/21/2017   HGB 13.6 08/21/2017   HCT 40.7 08/21/2017   PLT 138 (L) 08/21/2017   GLUCOSE 92 08/21/2017   CHOL 200 (H) 04/02/2017   TRIG 169 (H) 04/02/2017   HDL 49 04/02/2017   LDLCALC 117 (H) 04/02/2017   ALT 19 08/21/2017   AST 23 08/21/2017   NA 140 08/21/2017   K 5.1 08/21/2017   CL 106 04/07/2017   CREATININE 1.6 (H) 08/21/2017   BUN 33.9 (H) 08/21/2017   CO2 25 08/21/2017   TSH 2.220 08/21/2017   INR 1.0 04/02/2017     Labs from Dr. Virgina Jock dated 07/21/16: BUN 24, creatinine 1.5. Other CMET normal.  Dated 01/15/16: cholesterol 185, triglycerides 95, LDL 122, HDL 44. Dated 01/26/18: cholesterol 172,  triglycerides 79, HDL 42, LDL 114. Creatinine 1.4. Other chemistries, TSH, CBC normal.  Ecg today shows NSR rate 53. First degree AV block. RBBB. LVH. T wave abnormality c/w lateral ischemia. No change. I have personally reviewed and interpreted this study.   Echo dated 02/03/17: Study Conclusions  - Left ventricle: The cavity size was normal. Wall thickness was   increased in a pattern of moderate LVH. Systolic function was   normal. The estimated ejection fraction was in the range of 55%   to 60%. Wall motion was normal; there were no regional wall  motion abnormalities. Doppler parameters are consistent with   abnormal left ventricular relaxation (grade 1 diastolic   dysfunction). - Aortic valve: Valve mobility was restricted. There was mild   stenosis. There was mild regurgitation. Valve area (VTI): 1.53   cm^2. Valve area (Vmax): 1.47 cm^2. Valve area (Vmean): 1.48   cm^2.  Impressions:  - Normal LV systolic function; moderate LVH; mild diastolic   dysfunction; calcified aortic valve with mild AS (mean gradient   13 mmHg); mild AI; mild TR.  Myoview 10/03/16: Study Highlights     The left ventricular ejection fraction is mildly decreased (45-54%).  Nuclear stress EF: 53%.  There was no ST segment deviation noted during stress.  Findings consistent with prior myocardial infarction.  This is a low risk study.   Small distal anterior wall / apical infarct no ischemia EF 53% with apical hypokinesis No ischemia Baseline ECG with RBBB   Procedures   Left Heart Cath and Cors/Grafts Angiography  Conclusion     Prox RCA lesion, 90 %stenosed.  Mid RCA lesion, 100 %stenosed.  Ost Cx to Prox Cx lesion, 70 %stenosed.  Prox Cx to Mid Cx lesion, 100 %stenosed.  Ost LAD lesion, 70 %stenosed.  Prox LAD to Mid LAD lesion, 100 %stenosed.  SVG graft was visualized by angiography and is normal in caliber and anatomically normal.  SVG graft was visualized by  angiography and is normal in caliber and anatomically normal.  SVG graft was visualized by angiography and is normal in caliber and anatomically normal.  LIMA graft was visualized by angiography and is normal in caliber and anatomically normal.   1. Severe 3 vessel occlusive CAD 2. Patent LIMA to the LAD 3. Patent SVG to the first diagonal 4. Patent SVG to the OM 2 5. Patent SVG to the distal RCA  Plan: there is excellent patency of all his bypass grafts. Unchanged from 2007. Continue medical treatment and consider other causes of his symptoms.       Assessment / Plan:  1.   Coronary disease status post CABG. Negative Myoview in January 2018. Cardiac cath in July 2018 showed patent grafts.  Continue risk factor modification. On ASA and statin.   2. 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 Reported by oncology to be cancer free.   3. Hyperlipidemia. On crestor at maximum dose. Intolerant of Zetia. Will have him see our Pharm D to discuss PCSK 9 inhibitor. LDL 114. Goal <70.   6. History of RAS - now normotensive.  7. Esophageal disease post radiation. Still requiring periodic dilation.   8. Bifascicular block- chronic and asymptomatic.   9. Orthostatic hypotension. Chronic. We discussed liberal salt intake, elevation of head of bed and support hose. Discussed possible drug therapies including midodrine or florinef but he does not want to take additional medication at this time.

## 2018-02-04 ENCOUNTER — Encounter: Payer: Self-pay | Admitting: Cardiology

## 2018-02-04 ENCOUNTER — Ambulatory Visit (INDEPENDENT_AMBULATORY_CARE_PROVIDER_SITE_OTHER): Payer: Medicare Other | Admitting: Cardiology

## 2018-02-04 VITALS — BP 100/58 | HR 53 | Ht 73.0 in | Wt 188.2 lb

## 2018-02-04 DIAGNOSIS — I951 Orthostatic hypotension: Secondary | ICD-10-CM

## 2018-02-04 DIAGNOSIS — I350 Nonrheumatic aortic (valve) stenosis: Secondary | ICD-10-CM | POA: Diagnosis not present

## 2018-02-04 DIAGNOSIS — I209 Angina pectoris, unspecified: Secondary | ICD-10-CM

## 2018-02-04 DIAGNOSIS — E7800 Pure hypercholesterolemia, unspecified: Secondary | ICD-10-CM

## 2018-02-04 NOTE — Patient Instructions (Signed)
We will have you see our Pharm D about additional cholesterol therapy  Continue your other medication.  I will see you in 6 months

## 2018-02-05 ENCOUNTER — Other Ambulatory Visit (INDEPENDENT_AMBULATORY_CARE_PROVIDER_SITE_OTHER): Payer: Self-pay | Admitting: Internal Medicine

## 2018-02-05 DIAGNOSIS — G8929 Other chronic pain: Secondary | ICD-10-CM

## 2018-02-05 DIAGNOSIS — R1013 Epigastric pain: Principal | ICD-10-CM

## 2018-02-11 ENCOUNTER — Ambulatory Visit (INDEPENDENT_AMBULATORY_CARE_PROVIDER_SITE_OTHER): Payer: Medicare Other | Admitting: Pharmacist

## 2018-02-11 ENCOUNTER — Encounter: Payer: Self-pay | Admitting: Pharmacist

## 2018-02-11 DIAGNOSIS — E7800 Pure hypercholesterolemia, unspecified: Secondary | ICD-10-CM

## 2018-02-11 DIAGNOSIS — I209 Angina pectoris, unspecified: Secondary | ICD-10-CM

## 2018-02-11 NOTE — Patient Instructions (Addendum)
[facility] (pharmacy) [facility]  [phone] *Will start paperwork for Repatha/Praluent*  Cholesterol Cholesterol is a fat. Your body needs a small amount of cholesterol. Cholesterol (plaque) may build up in your blood vessels (arteries). That makes you more likely to have a heart attack or stroke. You cannot feel your cholesterol level. Having a blood test is the only way to find out if your level is high. Keep your test results. Work with your doctor to keep your cholesterol at a good level. What do the results mean?  Total cholesterol is how much cholesterol is in your blood.  LDL is bad cholesterol. This is the type that can build up. Try to have low LDL.  HDL is good cholesterol. It cleans your blood vessels and carries LDL away. Try to have high HDL.  Triglycerides are fat that the body can store or burn for energy. What are good levels of cholesterol?  Total cholesterol below 200.  LDL below 100 is good for people who have health risks. LDL below 70 is good for people who have very high risks.  HDL above 40 is good. It is best to have HDL of 60 or higher.  Triglycerides below 150. How can I lower my cholesterol? Diet Follow your diet program as told by your doctor.  Choose fish, white meat chicken, or [place] that is roasted or baked. Try not to eat red meat, fried foods, sausage, or lunch meats.  Eat lots of fresh fruits and vegetables.  Choose whole grains, beans, pasta, potatoes, and cereals.  Choose olive oil, corn oil, or canola oil. Only use small amounts.  Try not to eat butter, mayonnaise, shortening, or palm kernel oils.  Try not to eat foods with trans fats.  Choose low-fat or nonfat dairy foods. ? Drink skim or nonfat milk. ? Eat low-fat or nonfat yogurt and cheeses. ? Try not to drink whole milk or cream. ? Try not to eat ice cream, egg yolks, or full-fat cheeses.  Healthy desserts include angel food cake, ginger snaps, animal crackers, hard  candy, popsicles, and low-fat or nonfat frozen yogurt. Try not to eat pastries, cakes, pies, and cookies.  Exercise Follow your exercise program as told by your doctor.  Be more active. Try gardening, walking, and taking the stairs.  Ask your doctor about ways that you can be more active.  Medicine  Take over-the-counter and prescription medicines only as told by your doctor. This information is not intended to replace advice given to you by your health care provider. Make sure you discuss any questions you have with your health care provider. Document Released: 12/05/2008 Document Revised: 04/09/2016 Document Reviewed: 03/20/2016 Elsevier Interactive Patient Education  [employer].
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
## 2018-02-11 NOTE — Assessment & Plan Note (Signed)
LDL-c remains well above desired goal range for secondary prevention while taking rosuvastatin 40mg  daily. Patient follows a healthy diet, BMI is 24.8, and has a great exercise regimen. He is an ideal candidate to add PCSK9i to his therapy.   Repatha/Praluent mechanism of action, side effects, administration, common side effects, financial responsibilities , and available patient assistance programs were discussed during counseling. Will initiate paperwork for insurance prior-authorization. Plan to repeat fasting lipid panel and LFTs around 6th injection to assess response and safety.

## 2018-02-17 ENCOUNTER — Other Ambulatory Visit: Payer: Self-pay | Admitting: Pharmacist

## 2018-02-17 MED ORDER — EVOLOCUMAB 140 MG/ML ~~LOC~~ SOAJ: 140.0000 mg | SUBCUTANEOUS | 11 refills | Status: DC

## 2018-02-17 NOTE — Telephone Encounter (Signed)
Repatha PA approved. Rx sent to local [facility]. Patient instructed to call if unable to afford co-pay

## 2018-03-11 DIAGNOSIS — M25552 Pain in left hip: Secondary | ICD-10-CM | POA: Diagnosis not present

## 2018-03-11 DIAGNOSIS — M542 Cervicalgia: Secondary | ICD-10-CM | POA: Diagnosis not present

## 2018-03-15 ENCOUNTER — Other Ambulatory Visit (INDEPENDENT_AMBULATORY_CARE_PROVIDER_SITE_OTHER): Payer: Self-pay | Admitting: Internal Medicine

## 2018-03-15 DIAGNOSIS — G8929 Other chronic pain: Secondary | ICD-10-CM

## 2018-03-15 DIAGNOSIS — R1013 Epigastric pain: Principal | ICD-10-CM

## 2018-03-22 DIAGNOSIS — Z85810 Personal history of malignant neoplasm of tongue: Secondary | ICD-10-CM | POA: Insufficient documentation

## 2018-03-22 DIAGNOSIS — Z7982 Long term (current) use of aspirin: Secondary | ICD-10-CM | POA: Insufficient documentation

## 2018-03-22 DIAGNOSIS — Z9221 Personal history of antineoplastic chemotherapy: Secondary | ICD-10-CM | POA: Insufficient documentation

## 2018-03-22 DIAGNOSIS — D472 Monoclonal gammopathy: Secondary | ICD-10-CM | POA: Insufficient documentation

## 2018-03-22 DIAGNOSIS — Z923 Personal history of irradiation: Secondary | ICD-10-CM | POA: Insufficient documentation

## 2018-03-22 DIAGNOSIS — Z87891 Personal history of nicotine dependence: Secondary | ICD-10-CM | POA: Insufficient documentation

## 2018-03-26 ENCOUNTER — Other Ambulatory Visit: Payer: Self-pay | Admitting: Hematology and Oncology

## 2018-03-26 DIAGNOSIS — D472 Monoclonal gammopathy: Secondary | ICD-10-CM

## 2018-03-29 ENCOUNTER — Inpatient Hospital Stay: Payer: Medicare Other | Attending: Hematology and Oncology

## 2018-03-29 DIAGNOSIS — Z85810 Personal history of malignant neoplasm of tongue: Secondary | ICD-10-CM | POA: Diagnosis not present

## 2018-03-29 DIAGNOSIS — Z7982 Long term (current) use of aspirin: Secondary | ICD-10-CM | POA: Diagnosis not present

## 2018-03-29 DIAGNOSIS — Z9221 Personal history of antineoplastic chemotherapy: Secondary | ICD-10-CM | POA: Diagnosis not present

## 2018-03-29 DIAGNOSIS — D472 Monoclonal gammopathy: Secondary | ICD-10-CM | POA: Diagnosis not present

## 2018-03-29 DIAGNOSIS — Z87891 Personal history of nicotine dependence: Secondary | ICD-10-CM | POA: Diagnosis not present

## 2018-03-29 DIAGNOSIS — Z923 Personal history of irradiation: Secondary | ICD-10-CM | POA: Diagnosis not present

## 2018-03-29 LAB — CBC WITH DIFFERENTIAL/PLATELET
Basophils Absolute: 0 10*3/uL (ref 0.0–0.1)
Basophils Relative: 1 %
EOS PCT: 4 %
Eosinophils Absolute: 0.2 10*3/uL (ref 0.0–0.5)
HCT: 38.8 % (ref 38.4–49.9)
HEMOGLOBIN: 12.9 g/dL — AB (ref 13.0–17.1)
LYMPHS ABS: 1.6 10*3/uL (ref 0.9–3.3)
LYMPHS PCT: 36 %
MCH: 32.1 pg (ref 27.2–33.4)
MCHC: 33.4 g/dL (ref 32.0–36.0)
MCV: 96.3 fL (ref 79.3–98.0)
MONO ABS: 0.5 10*3/uL (ref 0.1–0.9)
Monocytes Relative: 11 %
NEUTROS ABS: 2.1 10*3/uL (ref 1.5–6.5)
Neutrophils Relative %: 48 %
PLATELETS: 106 10*3/uL — AB (ref 140–400)
RBC: 4.03 MIL/uL — AB (ref 4.20–5.82)
RDW: 14.8 % — ABNORMAL HIGH (ref 11.0–14.6)
WBC: 4.4 10*3/uL (ref 4.0–10.3)

## 2018-03-29 LAB — COMPREHENSIVE METABOLIC PANEL
ALBUMIN: 4.1 g/dL (ref 3.5–5.0)
ALT: 18 U/L (ref 0–44)
AST: 22 U/L (ref 15–41)
Alkaline Phosphatase: 32 U/L — ABNORMAL LOW (ref 38–126)
Anion gap: 8 (ref 5–15)
BUN: 15 mg/dL (ref 8–23)
CHLORIDE: 108 mmol/L (ref 98–111)
CO2: 27 mmol/L (ref 22–32)
Calcium: 9.5 mg/dL (ref 8.9–10.3)
Creatinine, Ser: 1.26 mg/dL — ABNORMAL HIGH (ref 0.61–1.24)
GFR calc Af Amer: >60 mL/min (ref >60)
GFR calc non Af Amer: 53 mL/min — ABNORMAL LOW (ref >60)
GLUCOSE: 95 mg/dL (ref 70–99)
POTASSIUM: 4 mmol/L (ref 3.5–5.1)
SODIUM: 143 mmol/L (ref 135–145)
Total Bilirubin: 0.4 mg/dL (ref 0.3–1.2)
Total Protein: 7.2 g/dL (ref 6.5–8.1)

## 2018-03-30 LAB — MULTIPLE MYELOMA PANEL, SERUM
ALBUMIN SERPL ELPH-MCNC: 3.8 g/dL (ref 2.9–4.4)
ALBUMIN/GLOB SERPL: 1.4 (ref 0.7–1.7)
ALPHA 1: 0.2 g/dL (ref 0.0–0.4)
Alpha2 Glob SerPl Elph-Mcnc: 0.7 g/dL (ref 0.4–1.0)
B-Globulin SerPl Elph-Mcnc: 0.8 g/dL (ref 0.7–1.3)
Gamma Glob SerPl Elph-Mcnc: 1.1 g/dL (ref 0.4–1.8)
Globulin, Total: 2.8 g/dL (ref 2.2–3.9)
IGA: 128 mg/dL (ref 61–437)
IGG (IMMUNOGLOBIN G), SERUM: 1244 mg/dL (ref 700–1600)
IGM (IMMUNOGLOBULIN M), SRM: 62 mg/dL (ref 15–143)
M Protein SerPl Elph-Mcnc: 0.7 g/dL — ABNORMAL HIGH
TOTAL PROTEIN ELP: 6.6 g/dL (ref 6.0–8.5)

## 2018-03-30 LAB — KAPPA/LAMBDA LIGHT CHAINS
KAPPA, LAMDA LIGHT CHAIN RATIO: 0.81 (ref 0.26–1.65)
Kappa free light chain: 19.6 mg/L — ABNORMAL HIGH (ref 3.3–19.4)
LAMDA FREE LIGHT CHAINS: 24.1 mg/L (ref 5.7–26.3)

## 2018-03-31 ENCOUNTER — Other Ambulatory Visit: Payer: Self-pay | Admitting: *Deleted

## 2018-03-31 MED ORDER — ROSUVASTATIN CALCIUM 40 MG PO TABS: 40.0000 mg | ORAL_TABLET | Freq: Every day | ORAL | 1 refills | Status: DC

## 2018-04-01 ENCOUNTER — Telehealth: Payer: Self-pay | Admitting: Pharmacist

## 2018-04-01 NOTE — Telephone Encounter (Signed)
LMOM; Li[pid panel and LFTs due in 1-2 week.

## 2018-04-05 ENCOUNTER — Encounter: Payer: Self-pay | Admitting: Hematology and Oncology

## 2018-04-05 ENCOUNTER — Inpatient Hospital Stay: Payer: Medicare Other | Admitting: Hematology and Oncology

## 2018-04-08 ENCOUNTER — Telehealth: Payer: Self-pay | Admitting: Hematology and Oncology

## 2018-04-08 NOTE — Telephone Encounter (Signed)
Patient called to reschedule  °

## 2018-04-16 ENCOUNTER — Other Ambulatory Visit (INDEPENDENT_AMBULATORY_CARE_PROVIDER_SITE_OTHER): Payer: Self-pay | Admitting: Internal Medicine

## 2018-04-16 DIAGNOSIS — G8929 Other chronic pain: Secondary | ICD-10-CM

## 2018-04-16 DIAGNOSIS — R1013 Epigastric pain: Principal | ICD-10-CM

## 2018-04-19 ENCOUNTER — Encounter: Payer: Self-pay | Admitting: Hematology and Oncology

## 2018-04-19 ENCOUNTER — Telehealth: Payer: Self-pay | Admitting: Hematology and Oncology

## 2018-04-19 ENCOUNTER — Inpatient Hospital Stay (HOSPITAL_BASED_OUTPATIENT_CLINIC_OR_DEPARTMENT_OTHER): Payer: Medicare Other | Admitting: Hematology and Oncology

## 2018-04-19 DIAGNOSIS — Z87891 Personal history of nicotine dependence: Secondary | ICD-10-CM

## 2018-04-19 DIAGNOSIS — D472 Monoclonal gammopathy: Secondary | ICD-10-CM | POA: Diagnosis not present

## 2018-04-19 DIAGNOSIS — Z9221 Personal history of antineoplastic chemotherapy: Secondary | ICD-10-CM | POA: Diagnosis not present

## 2018-04-19 DIAGNOSIS — Z7982 Long term (current) use of aspirin: Secondary | ICD-10-CM

## 2018-04-19 DIAGNOSIS — Z923 Personal history of irradiation: Secondary | ICD-10-CM | POA: Diagnosis not present

## 2018-04-19 DIAGNOSIS — T451X5A Adverse effect of antineoplastic and immunosuppressive drugs, initial encounter: Secondary | ICD-10-CM

## 2018-04-19 DIAGNOSIS — G620 Drug-induced polyneuropathy: Secondary | ICD-10-CM

## 2018-04-19 DIAGNOSIS — Z85810 Personal history of malignant neoplasm of tongue: Secondary | ICD-10-CM

## 2018-04-19 DIAGNOSIS — D696 Thrombocytopenia, unspecified: Secondary | ICD-10-CM

## 2018-04-19 NOTE — Telephone Encounter (Signed)
Gave patient avs and calendar of upcoming July 2020 appts.  °

## 2018-04-19 NOTE — Assessment & Plan Note (Signed)
The patient has been cancer free from the standpoint of tongue cancer No further workup is needed from the standpoint I reminded him to abstain from alcohol intake or tobacco use 

## 2018-04-19 NOTE — Assessment & Plan Note (Signed)
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

## 2018-04-19 NOTE — Assessment & Plan Note (Signed)
The cause could be related to his underlying medical condition. It is mild and there is little change compared from previous platelet count. The patient denies recent history of bleeding such as epistaxis, hematuria or hematochezia. He is asymptomatic from the thrombocytopenia. I will observe for now.

## 2018-04-19 NOTE — Assessment & Plan Note (Signed)
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 

## 2018-04-19 NOTE — Progress Notes (Signed)
[place] OFFICE PROGRESS NOTE  Patient Care Team: [name], MD as PCP - General (Internal Medicine) Rogene Houston, MD as Consulting Physician (Gastroenterology) Martinique, Peter M, MD as Consulting Physician (Cardiology)  ASSESSMENT & PLAN:  History of tongue cancer The patient has been cancer free from the standpoint of tongue cancer No further workup is needed from the standpoint I reminded him to abstain from alcohol intake or tobacco use  MGUS (monoclonal gammopathy of unknown significance) Clinically, he has no signs of end organ damage He had chronic kidney disease, stable, unrelated For that, I recommend once a year visit We discussed the natural history of MGUS I plan to see him back in 12 months with history, physical examination and blood work to be done a week prior to his appointment  Neuropathy due to chemotherapeutic drug [facility]) 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 Since I had increase his gabapentin to 3 times a day, he is able to function well He is eating better, able to exercise extensively without pain and is gaining weight He will continue current dose indefinitely He has no side effects from gabapentin so far  Thrombocytopenia The cause could be related to his underlying medical condition. It is mild and there is little change compared from previous platelet count. The patient denies recent history of bleeding such as epistaxis, hematuria or hematochezia. He is asymptomatic from the thrombocytopenia. I will observe for now.    No orders of the defined types were placed in this encounter.   INTERVAL HISTORY: Please see below for problem oriented charting. He returns for further follow-up He feels well Peripheral neuropathy about the same He denies recent infection, fever or chills No recent bone pain The patient denies any recent signs or symptoms of bleeding such as spontaneous epistaxis,  hematuria or hematochezia.   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 PET/CT scan showed no evidence of distant metastatic disease however he does have evidence of lymphadenopathy. He was offered concurrent chemoradiation therapy with weekly cisplatin between August to October 2012. From 2012 to 2014 he had serial imaging studies which show no evidence of disease recurrence. 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  He is being observed  REVIEW OF SYSTEMS:   Constitutional: Denies fevers, chills or abnormal weight loss Eyes: Denies blurriness of vision Ears, nose, mouth, throat, and face: Denies mucositis or sore throat Respiratory: Denies cough, dyspnea or wheezes Cardiovascular: Denies palpitation, chest discomfort or lower extremity swelling Gastrointestinal:  Denies nausea, heartburn or change in bowel habits Skin: Denies abnormal skin rashes Lymphatics: Denies new lymphadenopathy or easy bruising Neurological:Denies numbness, tingling or new weaknesses Behavioral/Psych: Mood is stable, no new changes  All other systems were reviewed with the patient and are negative.  I have reviewed the past medical history, past surgical history, social history and family history with the patient and they are unchanged from previous note.  ALLERGIES:  is allergic to zetia [ezetimibe].  MEDICATIONS:  Current Outpatient Medications  Medication Sig Dispense Refill  . aspirin EC 81 MG tablet Take 1 tablet (81 mg total) by mouth at bedtime.    . Evolocumab (REPATHA SURECLICK) 993  MG/ML SOAJ Inject 140 mg into the skin every 14 (fourteen) days. 2 pen 11  . gabapentin (NEURONTIN) 300  MG capsule TAKE 1 CAPSULE(300 MG) BY MOUTH THREE TIMES DAILY 270 capsule 2  . levothyroxine (SYNTHROID, LEVOTHROID) 100 MCG tablet TAKE ONE TABLET BY MOUTH ONCE DAILY BEFORE  BREAKFAST 90 tablet 0  . LORazepam (ATIVAN) 1 MG tablet TAKE 1 TABLET BY MOUTH THREE TIMES DAILY 90 tablet 0  . Multiple Vitamin (MULTIVITAMIN WITH MINERALS) TABS tablet Take 1 tablet by mouth daily.    . pantoprazole (PROTONIX) 40 MG tablet TAKE 1 TABLET BY MOUTH EVERY DAY BEFORE BREAKFAST 30 tablet 11  . pilocarpine (SALAGEN) 7.5 MG tablet TAKE 1 TABLET BY MOUTH THREE TIMES DAILY 270 tablet 3  . polyethylene glycol (MIRALAX / GLYCOLAX) packet Take 17 g by mouth daily as needed for mild constipation.     . rosuvastatin (CRESTOR) 40 MG tablet Take 1 tablet (40 mg total) by mouth at bedtime. 90 tablet 1  . vitamin B-12 (CYANOCOBALAMIN) 1000 MCG tablet Take 1,000 mcg by mouth daily.    Marland Kitchen zolpidem (AMBIEN) 10 MG tablet Take 10 mg by mouth at bedtime as needed for sleep. Reported on 03/26/2016     No current facility-administered medications for this visit.     PHYSICAL EXAMINATION: ECOG PERFORMANCE STATUS: 1 - Symptomatic but completely ambulatory  Vitals:   04/19/18 0821  BP: 133/72  Pulse: (!) 51  Resp: 18  Temp: 97.7 F (36.5 C)  SpO2: 100%   Filed Weights   04/19/18 0821  Weight: 186 lb 8 oz (84.6 kg)    GENERAL:alert, no distress and comfortable SKIN: skin color, texture, turgor are normal, no rashes or significant lesions EYES: normal, Conjunctiva are pink and non-injected, sclera clear OROPHARYNX:no exudate, no erythema and lips, buccal mucosa, and tongue normal  NECK: supple, thyroid normal size, non-tender, without nodularity LYMPH:  no palpable lymphadenopathy in the cervical, axillary or inguinal LUNGS: clear to auscultation and percussion with normal breathing effort HEART: regular rate & rhythm and no murmurs and no lower extremity edema ABDOMEN:abdomen soft, non-tender and normal bowel  sounds Musculoskeletal:no cyanosis of digits and no clubbing  NEURO: alert & oriented x 3 with fluent speech, no focal motor/sensory deficits  LABORATORY DATA:  I have reviewed the data as listed    Component Value Date/Time   NA 143 03/29/2018 0815   NA 140 08/21/2017 0952   K 4.0 03/29/2018 0815   K 5.1 08/21/2017 0952   CL 108 03/29/2018 0815   CL 106 11/25/2012 0812   CO2 27 03/29/2018 0815   CO2 25 08/21/2017 0952   GLUCOSE 95 03/29/2018 0815   GLUCOSE 92 08/21/2017 0952   GLUCOSE 102 (H) 11/25/2012 0812   BUN 15 03/29/2018 0815   BUN 33.9 (H) 08/21/2017 0952   CREATININE 1.26 (H) 03/29/2018 0815   CREATININE 1.6 (H) 08/21/2017 0952   CALCIUM 9.5 03/29/2018 0815   CALCIUM 10.0 08/21/2017 0952   PROT 7.2 03/29/2018 0815   PROT 8.5 (H) 08/21/2017 0952   PROT 7.5 08/21/2017 0952   ALBUMIN 4.1 03/29/2018 0815   ALBUMIN 4.2 08/21/2017 0952   AST 22 03/29/2018 0815   AST 23 08/21/2017 0952   ALT 18 03/29/2018 0815   ALT 19 08/21/2017 0952   ALKPHOS 32 (L) 03/29/2018 0815   ALKPHOS 40 08/21/2017 0952   BILITOT 0.4 03/29/2018 0815   BILITOT 0.26 08/21/2017 0952   GFRNONAA 53 (L) 03/29/2018 0815   GFRAA >60  03/29/2018 0815    No results found for: SPEP, UPEP  Lab Results  Component Value Date   WBC 4.4 03/29/2018   NEUTROABS 2.1 03/29/2018   HGB 12.9 (L) 03/29/2018   HCT 38.8 03/29/2018   MCV 96.3 03/29/2018   PLT 106 (L) 03/29/2018      Chemistry      Component Value Date/Time   NA 143 03/29/2018 0815   NA 140 08/21/2017 0952   K 4.0 03/29/2018 0815   K 5.1 08/21/2017 0952   CL 108 03/29/2018 0815   CL 106 11/25/2012 0812   CO2 27 03/29/2018 0815   CO2 25 08/21/2017 0952   BUN 15 03/29/2018 0815   BUN 33.9 (H) 08/21/2017 0952   CREATININE 1.26 (H) 03/29/2018 0815   CREATININE 1.6 (H) 08/21/2017 0952      Component Value Date/Time   CALCIUM 9.5 03/29/2018 0815   CALCIUM 10.0 08/21/2017 0952   ALKPHOS 32 (L) 03/29/2018 0815   ALKPHOS 40 08/21/2017  0952   AST 22 03/29/2018 0815   AST 23 08/21/2017 0952   ALT 18 03/29/2018 0815   ALT 19 08/21/2017 0952   BILITOT 0.4 03/29/2018 0815   BILITOT 0.26 08/21/2017 0952       All questions were answered. The patient knows to call the clinic with any problems, questions or concerns. No barriers to learning was detected.  I spent 15 minutes counseling the patient face to face. The total time spent in the appointment was 20 minutes and more than 50% was on counseling and review of test results  Heath Lark, MD 04/19/2018 4:24 PM

## 2018-05-17 ENCOUNTER — Other Ambulatory Visit: Payer: Self-pay | Admitting: Pharmacist

## 2018-05-17 ENCOUNTER — Telehealth: Payer: Self-pay | Admitting: Pharmacist

## 2018-05-17 DIAGNOSIS — E7800 Pure hypercholesterolemia, unspecified: Secondary | ICD-10-CM

## 2018-05-17 NOTE — Telephone Encounter (Signed)
Follow up Lipid panel not completed yet. Order for fastin lipid and LFTs in Epic  Patient to complete in 1-2 weeks

## 2018-05-20 DIAGNOSIS — E7800 Pure hypercholesterolemia, unspecified: Secondary | ICD-10-CM | POA: Diagnosis not present

## 2018-05-21 LAB — HEPATIC FUNCTION PANEL
ALT: 16 IU/L (ref 0–44)
AST: 19 IU/L (ref 0–40)
Albumin: 4.6 g/dL (ref 3.5–4.8)
Alkaline Phosphatase: 31 IU/L — ABNORMAL LOW (ref 39–117)
BILIRUBIN TOTAL: 0.3 mg/dL (ref 0.0–1.2)
Bilirubin, Direct: 0.12 mg/dL (ref 0.00–0.40)
Total Protein: 7.3 g/dL (ref 6.0–8.5)

## 2018-05-21 LAB — LIPID PANEL
CHOLESTEROL TOTAL: 91 mg/dL — AB (ref 100–199)
Chol/HDL Ratio: 1.7 ratio (ref 0.0–5.0)
HDL: 55 mg/dL (ref >39)
LDL CALC: 23 mg/dL (ref 0–99)
TRIGLYCERIDES: 67 mg/dL (ref 0–149)
VLDL CHOLESTEROL CAL: 13 mg/dL (ref 5–40)

## 2018-05-25 ENCOUNTER — Other Ambulatory Visit (INDEPENDENT_AMBULATORY_CARE_PROVIDER_SITE_OTHER): Payer: Self-pay | Admitting: Internal Medicine

## 2018-05-25 DIAGNOSIS — R1013 Epigastric pain: Principal | ICD-10-CM

## 2018-05-25 DIAGNOSIS — G8929 Other chronic pain: Secondary | ICD-10-CM

## 2018-05-27 ENCOUNTER — Other Ambulatory Visit (INDEPENDENT_AMBULATORY_CARE_PROVIDER_SITE_OTHER): Payer: Self-pay | Admitting: Internal Medicine

## 2018-05-27 DIAGNOSIS — G8929 Other chronic pain: Secondary | ICD-10-CM

## 2018-05-27 DIAGNOSIS — R1013 Epigastric pain: Principal | ICD-10-CM

## 2018-06-05 DIAGNOSIS — Z23 Encounter for immunization: Secondary | ICD-10-CM | POA: Diagnosis not present

## 2018-06-29 ENCOUNTER — Encounter (INDEPENDENT_AMBULATORY_CARE_PROVIDER_SITE_OTHER): Payer: Self-pay | Admitting: Internal Medicine

## 2018-06-29 ENCOUNTER — Ambulatory Visit (INDEPENDENT_AMBULATORY_CARE_PROVIDER_SITE_OTHER): Payer: Medicare Other | Admitting: Internal Medicine

## 2018-06-29 VITALS — BP 140/82 | HR 58 | Temp 97.7°F | Resp 18 | Ht 73.0 in | Wt 182.7 lb

## 2018-06-29 DIAGNOSIS — K219 Gastro-esophageal reflux disease without esophagitis: Secondary | ICD-10-CM

## 2018-06-29 DIAGNOSIS — K222 Esophageal obstruction: Secondary | ICD-10-CM

## 2018-06-29 DIAGNOSIS — I209 Angina pectoris, unspecified: Secondary | ICD-10-CM | POA: Diagnosis not present

## 2018-06-29 NOTE — Patient Instructions (Addendum)
All if you develop swallowing difficulty.

## 2018-06-29 NOTE — Progress Notes (Signed)
Presenting complaint;  Follow-up for esophageal stricture GERD and epigastric pain.  Database and subjective:  Patient is 77 year old Caucasian male who is here for scheduled visit.  He was last seen 1 year ago.  He has history of head and neck carcinoma which was diagnosed in July 2012 treated with chemoradiation complicated by high-grade proximal esophageal stricture which has required multiple dilation.  Initial dilation was in August 2013.  Last dilation was in August 2017. 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  Pain responded to Lorazepam.  He states he is doing well.  He is having no dysphagia whatsoever.  He has lost 5 pounds since his last visit.  He rarely has heartburn.  He denies nausea or vomiting.  He states his weight fluctuates by a few pounds.  He says heartburn is well controlled with therapy.  He says since he has been using CBD oil he has not had epigastric or lower sternal pain.  He has tried to back off lorazepam.  He has good appetite.  His bowels move as long as he takes polyethylene glycol.  He denies melena or rectal bleeding.  He was recently found to have mono clonal gammopathy of unknown significance by Dr. Alvy Bimler.  Work-up was negative for multiple myeloma.  He also tells me about his experience with use of new medication for his hyperlipidemia which has plummeted his LDL and improve his HDL.  Current Medications: Outpatient Encounter Medications as of 06/29/2018  Medication Sig  . AMBULATORY NON FORMULARY MEDICATION Medication Name:  CBD Oil - lightly applies to scar twice a day.  Marland Kitchen aspirin EC 81 MG tablet Take 1 tablet (81 mg total) by mouth at bedtime.  . cetirizine (ZYRTEC) 10 MG tablet Take 10 mg by mouth daily.  . Evolocumab (REPATHA SURECLICK) 401 MG/ML SOAJ Inject 140 mg into the skin every 14 (fourteen) days.  Marland Kitchen gabapentin (NEURONTIN) 300 MG capsule TAKE 1 CAPSULE(300 MG) BY MOUTH THREE TIMES DAILY  .  levothyroxine (SYNTHROID, LEVOTHROID) 100 MCG tablet TAKE ONE TABLET BY MOUTH ONCE DAILY BEFORE  BREAKFAST  . LORazepam (ATIVAN) 1 MG tablet TAKE 1 TABLET BY MOUTH THREE TIMES DAILY  . Multiple Vitamin (MULTIVITAMIN WITH MINERALS) TABS tablet Take 1 tablet by mouth daily.  . pantoprazole (PROTONIX) 40 MG tablet TAKE 1 TABLET BY MOUTH EVERY DAY BEFORE BREAKFAST  . pilocarpine (SALAGEN) 7.5 MG tablet TAKE 1 TABLET BY MOUTH THREE TIMES DAILY  . polyethylene glycol (MIRALAX / GLYCOLAX) packet Take 17 g by mouth daily as needed for mild constipation.   . vitamin B-12 (CYANOCOBALAMIN) 1000 MCG tablet Take 1,000 mcg by mouth daily.  Marland Kitchen zolpidem (AMBIEN) 10 MG tablet Take 10 mg by mouth at bedtime as needed for sleep. Reported on 03/26/2016  . [DISCONTINUED] rosuvastatin (CRESTOR) 40 MG tablet Take 1 tablet (40 mg total) by mouth at bedtime. (Patient not taking: Reported on 06/29/2018)   No facility-administered encounter medications on file as of 06/29/2018.      Objective: Blood pressure 140/82, pulse (!) 58, temperature 97.7 F (36.5 C), temperature source Oral, resp. rate 18, height '6\' 1"'$  (1.854 m), weight 182 lb 11.2 oz (82.9 kg). Patient is alert and in no acute distress. Conjunctiva is pink. Sclera is nonicteric Oropharyngeal mucosa is normal. No neck masses or thyromegaly noted. He has mid sternal scar. Cardiac exam with regular rhythm normal S1 and S2.  He has grade 2/6 to 3/6 systolic ejection murmur best heard at aortic  area. Lungs are clear to auscultation. Abdomen;  No LE edema or clubbing noted.  Assessment:  #1.  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  Last dilation was in August 2017.  He is not having any issues with dysphagia.  Therefore he does not need endoscopic intervention.  #2.  Chronic GERD.  He is doing well with PPI.  #3.  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  He has been on lorazepam for past few years.  He is getting  relief with CBD oil and trying to gradually come off lorazepam.   Plan:  Patient will continue pantoprazole at current dose of 40 mg p.o. every morning. He will call if he has dysphagia otherwise return for office visit in 1 year.

## 2018-07-02 ENCOUNTER — Other Ambulatory Visit (INDEPENDENT_AMBULATORY_CARE_PROVIDER_SITE_OTHER): Payer: Self-pay | Admitting: Internal Medicine

## 2018-07-02 DIAGNOSIS — G8929 Other chronic pain: Secondary | ICD-10-CM

## 2018-07-02 DIAGNOSIS — R1013 Epigastric pain: Principal | ICD-10-CM

## 2018-07-05 NOTE — Telephone Encounter (Signed)
Patient is stating he needs this medication refilled - stated [facility] had sent over a request on 10-10 and 10-11  -  Please adivse

## 2018-08-11 ENCOUNTER — Other Ambulatory Visit (INDEPENDENT_AMBULATORY_CARE_PROVIDER_SITE_OTHER): Payer: Self-pay | Admitting: Internal Medicine

## 2018-08-11 DIAGNOSIS — G8929 Other chronic pain: Secondary | ICD-10-CM

## 2018-08-11 DIAGNOSIS — R1013 Epigastric pain: Principal | ICD-10-CM

## 2018-08-13 DIAGNOSIS — R413 Other amnesia: Secondary | ICD-10-CM | POA: Diagnosis not present

## 2018-08-13 DIAGNOSIS — E038 Other specified hypothyroidism: Secondary | ICD-10-CM | POA: Diagnosis not present

## 2018-08-13 DIAGNOSIS — I2510 Atherosclerotic heart disease of native coronary artery without angina pectoris: Secondary | ICD-10-CM | POA: Diagnosis not present

## 2018-08-13 DIAGNOSIS — M25552 Pain in left hip: Secondary | ICD-10-CM | POA: Diagnosis not present

## 2018-08-13 DIAGNOSIS — E7849 Other hyperlipidemia: Secondary | ICD-10-CM | POA: Diagnosis not present

## 2018-08-13 DIAGNOSIS — D892 Hypergammaglobulinemia, unspecified: Secondary | ICD-10-CM | POA: Diagnosis not present

## 2018-08-13 DIAGNOSIS — Z6824 Body mass index (BMI) 24.0-24.9, adult: Secondary | ICD-10-CM | POA: Diagnosis not present

## 2018-08-13 DIAGNOSIS — Z23 Encounter for immunization: Secondary | ICD-10-CM | POA: Diagnosis not present

## 2018-08-13 DIAGNOSIS — I951 Orthostatic hypotension: Secondary | ICD-10-CM | POA: Diagnosis not present

## 2018-08-17 DIAGNOSIS — H40013 Open angle with borderline findings, low risk, bilateral: Secondary | ICD-10-CM | POA: Diagnosis not present

## 2018-08-17 DIAGNOSIS — Z961 Presence of intraocular lens: Secondary | ICD-10-CM | POA: Diagnosis not present

## 2018-08-17 DIAGNOSIS — H04123 Dry eye syndrome of bilateral lacrimal glands: Secondary | ICD-10-CM | POA: Diagnosis not present

## 2018-08-17 DIAGNOSIS — H35033 Hypertensive retinopathy, bilateral: Secondary | ICD-10-CM | POA: Diagnosis not present

## 2018-08-18 NOTE — Progress Notes (Signed)
Subjective:   George Bradley is seen for follow up CAD.   He had coronary artery bypass grafting in 2000. Cardiac catheterization was in 2007 and showed patent saphenous vein grafts x3 with a patent left internal mammary artery and well-preserved LV function. His EF is 55%. His last nuclear stress test in January 2018 showed a small fixed apical defect with normal ejection fraction. No ischemia. Echo in April showed normal EF and mild AS. Due to progressive symptoms cardiac cath was repeated in July 2018 and all grafts were still patent with no changed since 2007.   He also has a history of orthostatic hypotension and has persistent lightheadedness. He does note that increase salt intake helps. He has been on no antihypertensive therapy. He's had a history of what sounds like a Maze procedure when he lived in [place]. His other problems include hyperlipidemia, insomnia, oral cancer, and a history of depression. 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 He has had esophageal dilitation several times in the past. He is followed by oncology with no recurrent tongue CA noted. 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   On his last visit he was started on a PCSK 9 inhibitor with an excellent response. He is now interested in reducing his Crestor dose.   On follow up today he is doing well. His dizziness has been unchanged. He is very active and has a 2.5 hour exercise program that he does every day. No chest pain or SOB.   Current Outpatient Medications  Medication Sig Dispense Refill  . AMBULATORY NON FORMULARY MEDICATION Medication Name:  CBD Oil - lightly applies to scar twice a day.    Marland Kitchen aspirin EC 81 MG tablet Take 1 tablet (81 mg total) by mouth at bedtime.    . cetirizine (ZYRTEC) 10 MG tablet Take 10 mg by mouth daily.    . Evolocumab (REPATHA SURECLICK) 161 MG/ML SOAJ Inject 140 mg into the skin every 14 (fourteen) days. 2 pen 11  . gabapentin (NEURONTIN) 300 MG capsule TAKE 1  CAPSULE(300 MG) BY MOUTH THREE TIMES DAILY 270 capsule 2  . levothyroxine (SYNTHROID, LEVOTHROID) 100 MCG tablet TAKE ONE TABLET BY MOUTH ONCE DAILY BEFORE  BREAKFAST 90 tablet 0  . LORazepam (ATIVAN) 1 MG tablet TAKE 1 TABLET BY MOUTH THREE TIMES DAILY 90 tablet 0  . Multiple Vitamin (MULTIVITAMIN WITH MINERALS) TABS tablet Take 1 tablet by mouth daily.    . pantoprazole (PROTONIX) 40 MG tablet TAKE 1 TABLET BY MOUTH EVERY DAY BEFORE BREAKFAST 30 tablet 11  . pilocarpine (SALAGEN) 7.5 MG tablet TAKE 1 TABLET BY MOUTH THREE TIMES DAILY 270 tablet 3  . polyethylene glycol (MIRALAX / GLYCOLAX) packet Take 17 g by mouth daily as needed for mild constipation.     . vitamin B-12 (CYANOCOBALAMIN) 1000 MCG tablet Take 1,000 mcg by mouth daily.    Marland Kitchen zolpidem (AMBIEN) 10 MG tablet Take 10 mg by mouth at bedtime as needed for sleep. Reported on 03/26/2016    . rosuvastatin (CRESTOR) 20 MG tablet Take 1 tablet (20 mg total) by mouth daily. 90 tablet 3   No current facility-administered medications for this visit.     Allergies  Allergen Reactions  . Zetia [Ezetimibe] Other (See Comments)    "made me feel bad"    Patient Active Problem List   Diagnosis Date Noted  . Dizziness 08/18/2017  . 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 Constipation 03/23/2014  .  Dysphagia 03/23/2014  . Neuropathy due to chemotherapeutic drug ([place]) 03/23/2014  . Xerostomia 06/10/2013  . Thrombocytopenia ([place]) 06/10/2013  . S/P radiation therapy   . 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 Orthostatic hypotension 05/11/2012  . Epigastric pain 05/11/2012  . History of tongue cancer 11/17/2011  . Depression 10/01/2011  . Renal artery stenosis, native, bilateral ([place]) 09/03/2011  . 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 Angina pectoris ([place]) 02/20/2011  . Hypercholesterolemia 02/20/2011  . Aortic valve stenosis, mild 02/20/2011    Social History   Tobacco Use  Smoking Status Former Smoker  .  Years: 2.00  . Types: Cigars  . Last attempt to quit: 09/21/2009  . Years since quitting: 8.9  Smokeless Tobacco Former Systems developer  Tobacco Comment   2 cigars a week    Social History   Substance and Sexual Activity  Alcohol Use Yes  . Alcohol/week: 0.0 standard drinks   Comment: rare  wine    Family History  Problem Relation Age of Onset  . Heart disease Father   . Heart disease Brother   . Heart disease Sister   . Cancer Sister        breast cancer  . Cancer Mother        breast ca  . Dementia Mother   . Hyperlipidemia Son     Review of Systems:   As noted in history of present illness.  All other systems were reviewed and are negative.   Physical Exam:    BP 120/60   Pulse 60   Ht 6\' 1"  (1.854 m)   Wt 184 lb (83.5 kg)   BMI 24.28 kg/m    GENERAL:  Well appearing WM in NAD HEENT:  PERRL, EOMI, sclera are clear. Oropharynx is clear. NECK:  No jugular venous distention, carotid upstroke brisk and symmetric, no bruits, no thyromegaly or adenopathy LUNGS:  Clear to auscultation bilaterally CHEST:  Unremarkable HEART:  RRR,  PMI not displaced or sustained,S1 and S2 within normal limits, no S3, no S4: no clicks, no rubs, no murmurs ABD:  Soft, nontender. BS +, no masses or bruits. No hepatomegaly, no splenomegaly EXT:  2 + pulses throughout, no edema, no cyanosis no clubbing SKIN:  Warm and dry.  No rashes NEURO:  Alert and oriented x 3. Cranial nerves II through XII intact. PSYCH:  Cognitively intact      Laboratory data:  Lab Results  Component Value Date   WBC 4.4 03/29/2018   HGB 12.9 (L) 03/29/2018   HCT 38.8 03/29/2018   PLT 106 (L) 03/29/2018   GLUCOSE 95 03/29/2018   CHOL 91 (L) 05/20/2018   TRIG 67 05/20/2018   HDL 55 05/20/2018   LDLCALC 23 05/20/2018   ALT 16 05/20/2018   AST 19 05/20/2018   NA 143 03/29/2018   K 4.0 03/29/2018   CL 108 03/29/2018   CREATININE 1.26 (H) 03/29/2018   BUN 15 03/29/2018   CO2 27 03/29/2018   TSH 2.220  08/21/2017   INR 1.0 04/02/2017     Labs from Dr. Virgina Jock dated 07/21/16: BUN 24, creatinine 1.5. Other CMET normal.  Dated 01/15/16: cholesterol 185, triglycerides 95, LDL 122, HDL 44. Dated 01/26/18: cholesterol 172, triglycerides 79, HDL 42, LDL 114. Creatinine 1.4. Other chemistries, TSH, CBC normal. Dated 08/13/18: cholesterol 109, triglycerides 51, HDL 61, LDL 38. Creatinine 1.3. ALT and TSH normal.  Ecg today shows NSR rate 53. First degree AV block. RBBB. LVH. T wave abnormality c/w lateral ischemia. No change. I  have personally reviewed and interpreted this study.   Echo dated 02/03/17: Study Conclusions  - Left ventricle: The cavity size was normal. Wall thickness was   increased in a pattern of moderate LVH. Systolic function was   normal. The estimated ejection fraction was in the range of 55%   to 60%. Wall motion was normal; there were no regional wall   motion abnormalities. Doppler parameters are consistent with   abnormal left ventricular relaxation (grade 1 diastolic   dysfunction). - Aortic valve: Valve mobility was restricted. There was mild   stenosis. There was mild regurgitation. Valve area (VTI): 1.53   cm^2. Valve area (Vmax): 1.47 cm^2. Valve area (Vmean): 1.48   cm^2.  Impressions:  - Normal LV systolic function; moderate LVH; mild diastolic   dysfunction; calcified aortic valve with mild AS (mean gradient   13 mmHg); mild AI; mild TR.  Myoview 10/03/16: Study Highlights     The left ventricular ejection fraction is mildly decreased (45-54%).  Nuclear stress EF: 53%.  There was no ST segment deviation noted during stress.  Findings consistent with prior myocardial infarction.  This is a low risk study.   Small distal anterior wall / apical infarct no ischemia EF 53% with apical hypokinesis No ischemia Baseline ECG with RBBB   Procedures   Left Heart Cath and Cors/Grafts Angiography  Conclusion     Prox RCA lesion, 90  %stenosed.  Mid RCA lesion, 100 %stenosed.  Ost Cx to Prox Cx lesion, 70 %stenosed.  Prox Cx to Mid Cx lesion, 100 %stenosed.  Ost LAD lesion, 70 %stenosed.  Prox LAD to Mid LAD lesion, 100 %stenosed.  SVG graft was visualized by angiography and is normal in caliber and anatomically normal.  SVG graft was visualized by angiography and is normal in caliber and anatomically normal.  SVG graft was visualized by angiography and is normal in caliber and anatomically normal.  LIMA graft was visualized by angiography and is normal in caliber and anatomically normal.   1. Severe 3 vessel occlusive CAD 2. Patent LIMA to the LAD 3. Patent SVG to the first diagonal 4. Patent SVG to the OM 2 5. Patent SVG to the distal RCA  Plan: there is excellent patency of all his bypass grafts. Unchanged from 2007. Continue medical treatment and consider other causes of his symptoms.       Assessment / Plan:  1.   Coronary disease status post CABG. Negative Myoview in January 2018. Cardiac cath in July 2018 showed patent grafts.  Continue risk factor modification. On ASA and statin.   2. 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 Reported by oncology to be cancer free.   3. Hyperlipidemia. On crestor at maximum dose. Intolerant of Zetia. Now on PCSK 9 inhibitor with excellent response. LDL 38. I have agreed to reduce Crestor to 20 mg daily.   6. History of RAS - now normotensive.  7. Esophageal disease post radiation. Still requiring periodic dilation.   8. Bifascicular block- chronic and asymptomatic.   9. Orthostatic hypotension. Chronic. Continue liberal salt intake, elevation of head of bed and support hose.

## 2018-08-23 ENCOUNTER — Encounter: Payer: Self-pay | Admitting: Cardiology

## 2018-08-23 ENCOUNTER — Ambulatory Visit (INDEPENDENT_AMBULATORY_CARE_PROVIDER_SITE_OTHER): Payer: Medicare Other | Admitting: Cardiology

## 2018-08-23 VITALS — BP 120/60 | HR 60 | Ht 73.0 in | Wt 184.0 lb

## 2018-08-23 DIAGNOSIS — I951 Orthostatic hypotension: Secondary | ICD-10-CM

## 2018-08-23 DIAGNOSIS — I259 Chronic ischemic heart disease, unspecified: Secondary | ICD-10-CM

## 2018-08-23 DIAGNOSIS — E7800 Pure hypercholesterolemia, unspecified: Secondary | ICD-10-CM

## 2018-08-23 DIAGNOSIS — I350 Nonrheumatic aortic (valve) stenosis: Secondary | ICD-10-CM

## 2018-08-23 MED ORDER — ROSUVASTATIN CALCIUM 20 MG PO TABS: 20.0000 mg | ORAL_TABLET | Freq: Every day | ORAL | 3 refills | Status: DC

## 2018-08-23 NOTE — Patient Instructions (Signed)
Reduce crestor to 20 mg daily

## 2018-09-07 ENCOUNTER — Other Ambulatory Visit (INDEPENDENT_AMBULATORY_CARE_PROVIDER_SITE_OTHER): Payer: Self-pay | Admitting: Internal Medicine

## 2018-09-09 ENCOUNTER — Other Ambulatory Visit: Payer: Self-pay | Admitting: Hematology and Oncology

## 2018-09-16 ENCOUNTER — Other Ambulatory Visit (INDEPENDENT_AMBULATORY_CARE_PROVIDER_SITE_OTHER): Payer: Self-pay | Admitting: Internal Medicine

## 2018-09-16 DIAGNOSIS — G8929 Other chronic pain: Secondary | ICD-10-CM

## 2018-09-16 DIAGNOSIS — R1013 Epigastric pain: Principal | ICD-10-CM

## 2018-10-14 ENCOUNTER — Other Ambulatory Visit (INDEPENDENT_AMBULATORY_CARE_PROVIDER_SITE_OTHER): Payer: Self-pay | Admitting: Internal Medicine

## 2018-10-14 DIAGNOSIS — R1013 Epigastric pain: Principal | ICD-10-CM

## 2018-10-14 DIAGNOSIS — G8929 Other chronic pain: Secondary | ICD-10-CM

## 2018-10-15 ENCOUNTER — Other Ambulatory Visit: Payer: Self-pay | Admitting: *Deleted

## 2018-10-15 MED ORDER — ROSUVASTATIN CALCIUM 20 MG PO TABS: 20.0000 mg | ORAL_TABLET | Freq: Every day | ORAL | 1 refills | Status: DC

## 2018-11-19 ENCOUNTER — Other Ambulatory Visit (INDEPENDENT_AMBULATORY_CARE_PROVIDER_SITE_OTHER): Payer: Self-pay | Admitting: Internal Medicine

## 2018-11-19 DIAGNOSIS — R1013 Epigastric pain: Principal | ICD-10-CM

## 2018-11-19 DIAGNOSIS — G8929 Other chronic pain: Secondary | ICD-10-CM

## 2018-11-25 DIAGNOSIS — H26492 Other secondary cataract, left eye: Secondary | ICD-10-CM | POA: Diagnosis not present

## 2018-11-25 DIAGNOSIS — H4900 Third [oculomotor] nerve palsy, unspecified eye: Secondary | ICD-10-CM | POA: Diagnosis not present

## 2018-11-25 DIAGNOSIS — H532 Diplopia: Secondary | ICD-10-CM | POA: Diagnosis not present

## 2018-11-25 DIAGNOSIS — H26493 Other secondary cataract, bilateral: Secondary | ICD-10-CM | POA: Diagnosis not present

## 2018-11-25 DIAGNOSIS — H40013 Open angle with borderline findings, low risk, bilateral: Secondary | ICD-10-CM | POA: Diagnosis not present

## 2018-12-16 ENCOUNTER — Other Ambulatory Visit (INDEPENDENT_AMBULATORY_CARE_PROVIDER_SITE_OTHER): Payer: Self-pay | Admitting: Internal Medicine

## 2018-12-16 DIAGNOSIS — G8929 Other chronic pain: Secondary | ICD-10-CM

## 2018-12-16 DIAGNOSIS — R1013 Epigastric pain: Principal | ICD-10-CM

## 2019-01-14 ENCOUNTER — Telehealth: Payer: Self-pay | Admitting: Cardiology

## 2019-01-14 NOTE — Telephone Encounter (Signed)
New message   Pt c/o BP issue: STAT if pt c/o blurred vision, one-sided weakness or slurred speech  1. What are your last 5 BP readings? 125/259 01/14/2019 this reading is laying down standing 66/42   2. Are you having any other symptoms (ex. Dizziness, headache, blurred vision, passed out)? Dizziness, wobbly,   3. What is your BP issue? Blood pressure has dropped

## 2019-01-14 NOTE — Telephone Encounter (Signed)
Spoke with pt, he has a history of orthostatic hypotension and he has had increased dizziness and unsteady gait for the last month. Dr Virgina Jock started him on levothyroxine and that helped some with the dizziness but now it is getting worse again. Patient admits he has not been using his salt tablets and he is addicted to exercise. Encouraged patient to eat salt and drink Gatorade after exercising to help rehydrate. Patient voiced understanding and will call if symptoms do not improve.

## 2019-01-20 ENCOUNTER — Other Ambulatory Visit (INDEPENDENT_AMBULATORY_CARE_PROVIDER_SITE_OTHER): Payer: Self-pay | Admitting: Internal Medicine

## 2019-01-20 DIAGNOSIS — R1013 Epigastric pain: Principal | ICD-10-CM

## 2019-01-20 DIAGNOSIS — G8929 Other chronic pain: Secondary | ICD-10-CM

## 2019-01-24 ENCOUNTER — Other Ambulatory Visit: Payer: Self-pay | Admitting: Pharmacist

## 2019-01-24 MED ORDER — EVOLOCUMAB 140 MG/ML ~~LOC~~ SOAJ: 140.0000 mg | SUBCUTANEOUS | 11 refills | Status: DC

## 2019-01-31 DIAGNOSIS — Z125 Encounter for screening for malignant neoplasm of prostate: Secondary | ICD-10-CM | POA: Diagnosis not present

## 2019-01-31 DIAGNOSIS — E7849 Other hyperlipidemia: Secondary | ICD-10-CM | POA: Diagnosis not present

## 2019-01-31 DIAGNOSIS — E038 Other specified hypothyroidism: Secondary | ICD-10-CM | POA: Diagnosis not present

## 2019-01-31 DIAGNOSIS — N183 Chronic kidney disease, stage 3 (moderate): Secondary | ICD-10-CM | POA: Diagnosis not present

## 2019-02-01 DIAGNOSIS — R82998 Other abnormal findings in urine: Secondary | ICD-10-CM | POA: Diagnosis not present

## 2019-02-07 DIAGNOSIS — Z1339 Encounter for screening examination for other mental health and behavioral disorders: Secondary | ICD-10-CM | POA: Diagnosis not present

## 2019-02-07 DIAGNOSIS — Z1331 Encounter for screening for depression: Secondary | ICD-10-CM | POA: Diagnosis not present

## 2019-02-15 DIAGNOSIS — C029 Malignant neoplasm of tongue, unspecified: Secondary | ICD-10-CM | POA: Diagnosis not present

## 2019-02-15 DIAGNOSIS — J320 Chronic maxillary sinusitis: Secondary | ICD-10-CM | POA: Diagnosis not present

## 2019-02-21 ENCOUNTER — Other Ambulatory Visit (INDEPENDENT_AMBULATORY_CARE_PROVIDER_SITE_OTHER): Payer: Self-pay | Admitting: *Deleted

## 2019-02-21 DIAGNOSIS — R1310 Dysphagia, unspecified: Secondary | ICD-10-CM

## 2019-02-23 ENCOUNTER — Other Ambulatory Visit: Payer: Self-pay

## 2019-02-23 ENCOUNTER — Other Ambulatory Visit (HOSPITAL_COMMUNITY)
Admission: RE | Admit: 2019-02-23 | Discharge: 2019-02-23 | Disposition: A | Discharge status: Home / Self Care | Payer: Medicare Other | Source: Ambulatory Visit | Attending: Internal Medicine | Admitting: Internal Medicine

## 2019-02-23 DIAGNOSIS — Z1159 Encounter for screening for other viral diseases: Secondary | ICD-10-CM | POA: Diagnosis not present

## 2019-02-23 LAB — SARS CORONAVIRUS 2 BY RT PCR (HOSPITAL ORDER, PERFORMED IN ~~LOC~~ HOSPITAL LAB): SARS Coronavirus 2: NEGATIVE

## 2019-02-24 ENCOUNTER — Other Ambulatory Visit: Payer: Self-pay

## 2019-02-24 ENCOUNTER — Encounter (HOSPITAL_COMMUNITY)
Admission: RE | Disposition: A | Discharge status: Home / Self Care | Payer: Self-pay | Source: Home / Self Care | Attending: Internal Medicine

## 2019-02-24 ENCOUNTER — Ambulatory Visit (HOSPITAL_COMMUNITY)
Admission: RE | Admit: 2019-02-24 | Discharge: 2019-02-24 | Disposition: A | Discharge status: Home / Self Care | Payer: Medicare Other | Attending: Internal Medicine | Admitting: Internal Medicine

## 2019-02-24 ENCOUNTER — Encounter (HOSPITAL_COMMUNITY): Payer: Self-pay | Admitting: *Deleted

## 2019-02-24 DIAGNOSIS — Z7982 Long term (current) use of aspirin: Secondary | ICD-10-CM | POA: Diagnosis not present

## 2019-02-24 DIAGNOSIS — Z87891 Personal history of nicotine dependence: Secondary | ICD-10-CM | POA: Insufficient documentation

## 2019-02-24 DIAGNOSIS — K219 Gastro-esophageal reflux disease without esophagitis: Secondary | ICD-10-CM | POA: Diagnosis not present

## 2019-02-24 DIAGNOSIS — E039 Hypothyroidism, unspecified: Secondary | ICD-10-CM | POA: Insufficient documentation

## 2019-02-24 DIAGNOSIS — Z79890 Hormone replacement therapy (postmenopausal): Secondary | ICD-10-CM | POA: Insufficient documentation

## 2019-02-24 DIAGNOSIS — Z85818 Personal history of malignant neoplasm of other sites of lip, oral cavity, and pharynx: Secondary | ICD-10-CM | POA: Insufficient documentation

## 2019-02-24 DIAGNOSIS — Z791 Long term (current) use of non-steroidal anti-inflammatories (NSAID): Secondary | ICD-10-CM | POA: Diagnosis not present

## 2019-02-24 DIAGNOSIS — Y842 Radiological procedure and radiotherapy as the cause of abnormal reaction of the patient, or of later complication, without mention of misadventure at the time of the procedure: Secondary | ICD-10-CM | POA: Insufficient documentation

## 2019-02-24 DIAGNOSIS — I2510 Atherosclerotic heart disease of native coronary artery without angina pectoris: Secondary | ICD-10-CM | POA: Insufficient documentation

## 2019-02-24 DIAGNOSIS — R1314 Dysphagia, pharyngoesophageal phase: Secondary | ICD-10-CM | POA: Diagnosis not present

## 2019-02-24 DIAGNOSIS — R1310 Dysphagia, unspecified: Secondary | ICD-10-CM

## 2019-02-24 DIAGNOSIS — K222 Esophageal obstruction: Secondary | ICD-10-CM | POA: Diagnosis not present

## 2019-02-24 DIAGNOSIS — Z86718 Personal history of other venous thrombosis and embolism: Secondary | ICD-10-CM | POA: Diagnosis not present

## 2019-02-24 DIAGNOSIS — Z85810 Personal history of malignant neoplasm of tongue: Secondary | ICD-10-CM | POA: Diagnosis not present

## 2019-02-24 DIAGNOSIS — M1990 Unspecified osteoarthritis, unspecified site: Secondary | ICD-10-CM | POA: Diagnosis not present

## 2019-02-24 DIAGNOSIS — K449 Diaphragmatic hernia without obstruction or gangrene: Secondary | ICD-10-CM | POA: Insufficient documentation

## 2019-02-24 DIAGNOSIS — Z923 Personal history of irradiation: Secondary | ICD-10-CM | POA: Diagnosis not present

## 2019-02-24 DIAGNOSIS — Z79899 Other long term (current) drug therapy: Secondary | ICD-10-CM | POA: Insufficient documentation

## 2019-02-24 HISTORY — PX: ESOPHAGEAL DILATION: SHX303

## 2019-02-24 HISTORY — PX: ESOPHAGOGASTRODUODENOSCOPY: SHX5428

## 2019-02-24 SURGERY — EGD (ESOPHAGOGASTRODUODENOSCOPY)
Anesthesia: Moderate Sedation

## 2019-02-24 MED ORDER — SODIUM CHLORIDE 0.9% FLUSH
INTRAVENOUS | Status: AC
  Filled 2019-02-24: qty 10

## 2019-02-24 MED ORDER — MEPERIDINE HCL 50 MG/ML IJ SOLN
INTRAMUSCULAR | Status: DC | PRN
  Administered 2019-02-24 (×2): 25 mg via INTRAVENOUS

## 2019-02-24 MED ORDER — PROMETHAZINE HCL 25 MG/ML IJ SOLN
INTRAMUSCULAR | Status: AC
  Filled 2019-02-24: qty 1

## 2019-02-24 MED ORDER — SODIUM CHLORIDE 0.9 % IV SOLN
INTRAVENOUS | Status: DC
  Administered 2019-02-24: 14:00:00 via INTRAVENOUS

## 2019-02-24 MED ORDER — LIDOCAINE VISCOUS HCL 2 % MT SOLN
OROMUCOSAL | Status: DC | PRN
  Administered 2019-02-24: 6 mL via OROMUCOSAL

## 2019-02-24 MED ORDER — LIDOCAINE VISCOUS HCL 2 % MT SOLN
OROMUCOSAL | Status: AC
  Filled 2019-02-24: qty 15

## 2019-02-24 MED ORDER — MIDAZOLAM HCL 5 MG/5ML IJ SOLN
INTRAMUSCULAR | Status: AC
  Filled 2019-02-24: qty 10

## 2019-02-24 MED ORDER — PROMETHAZINE HCL 25 MG/ML IJ SOLN
INTRAMUSCULAR | Status: DC | PRN
  Administered 2019-02-24: 6.25 mg via INTRAVENOUS

## 2019-02-24 MED ORDER — MEPERIDINE HCL 50 MG/ML IJ SOLN
INTRAMUSCULAR | Status: AC
  Filled 2019-02-24: qty 1

## 2019-02-24 MED ORDER — MIDAZOLAM HCL 5 MG/5ML IJ SOLN
INTRAMUSCULAR | Status: DC | PRN
  Administered 2019-02-24 (×2): 2 mg via INTRAVENOUS
  Administered 2019-02-24: 1 mg via INTRAVENOUS

## 2019-02-24 NOTE — Progress Notes (Signed)
Dr. Laural Golden evaluated patient upper lip with red excoriation at end of procedure due to bite block. Patient without bleeding. No further orders.
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
## 2019-02-24 NOTE — Op Note (Signed)
[facility] Patient Name: George Bradley Procedure Date: 02/24/2019 2:35 PM MRN: [id number] Date of Birth: Apr 04, 1941 Attending MD: Hildred Laser , MD CSN: [id number] Age: 78 Admit Type: Outpatient Procedure:                Upper GI endoscopy Indications:              For therapy of esophageal stricture Providers:                Hildred Laser, MD, Hinton Rao, RN, Randa Spike, Technician Referring MD:             [name], MD Medicines:                Lidocaine jelly, Meperidine 50 mg IV, Midazolam 5                            mg IV, Promethazine 7.41 mg IV Complications:            No immediate complications. Estimated Blood Loss:     Estimated blood loss was minimal. Procedure:                Pre-Anesthesia Assessment:                           - Prior to the procedure, a History and Physical                            was performed, and patient medications and                            allergies were reviewed. The patient's tolerance of                            previous anesthesia was also reviewed. The risks                            and benefits of the procedure and the sedation                            options and risks were discussed with the patient.                            All questions were answered, and informed consent                            was obtained. Prior Anticoagulants: The patient has                            taken no previous anticoagulant or antiplatelet                            agents, has taken no previous anticoagulant or  antiplatelet agents except for aspirin and has                            taken no previous anticoagulant or antiplatelet                            agents except for NSAID medication. ASA Grade                            Assessment: III - A patient with severe systemic                            disease. After reviewing the risks and benefits,          the patient was deemed in satisfactory condition to                            undergo the procedure.                           After obtaining informed consent, the endoscope was                            passed under direct vision. Throughout the                            procedure, the patient's blood pressure, pulse, and                            oxygen saturations were monitored continuously. The                            [id number]-H190 ([id number]) scope was introduced through the                            mouth, and advanced to the second part of duodenum.                            The upper GI endoscopy was accomplished without                            difficulty. The patient tolerated the procedure                            well. Scope In: 3:05:11 PM Scope Out: 3:25:07 PM Total Procedure Duration: 0 hours 19 minutes 56 seconds  Findings:      One benign-appearing, intrinsic mild stenosis was found 20 to 21 cm from       the incisors. This stenosis measured 1 cm (in length). The stenosis was       traversed. The scope was withdrawn. Dilation was performed with a       Maloney dilator with mild resistance at 50 Fr and 54 Fr. The dilation       site was examined following endoscope reinsertion and showed mild       mucosal disruption,  moderate improvement in luminal narrowing and no       perforation.      The Z-line was regular and was found 42 cm from the incisors.      A 2 cm hiatal hernia was present.      The entire examined stomach was normal.      The duodenal bulb and second portion of the duodenum were normal.       Estimated blood loss was minimal. Impression:               - Benign-appearing proximal non-critical esophageal                            stenosis. Dilated.                           - Z-line regular, 42 cm from the incisors.                           - 2 cm hiatal hernia.                           - Normal stomach.                           - Normal  duodenal bulb and second portion of the                            duodenum.                           - No specimens collected. Moderate Sedation:      Moderate (conscious) sedation was administered by the endoscopy nurse       and supervised by the endoscopist. The following parameters were       monitored: oxygen saturation, heart rate, blood pressure, CO2       capnography and response to care. Total physician intraservice time was       26 minutes. Recommendation:           - Patient has a contact number available for                            emergencies. The signs and symptoms of potential                            delayed complications were discussed with the                            patient. Return to normal activities tomorrow.                            Written discharge instructions were provided to the                            patient.                           - Resume previous diet today.                           -  Continue present medications.                           - No aspirin, ibuprofen, naproxen, or other                            non-steroidal anti-inflammatory drugs for 2 days.                           - Telephone GI clinic in 1 week.                           - Repeat upper endoscopy PRN. Procedure Code(s):        --- Professional ---                           [phone], Esophagogastroduodenoscopy, flexible,                            transoral; diagnostic, including collection of                            specimen(s) by brushing or washing, when performed                            (separate procedure)                           43450, Dilation of esophagus, by unguided sound or                            bougie, single or multiple passes                           99153, Moderate sedation; each additional 15                            minutes intraservice time                           G0500, Moderate sedation services provided by the                             same physician or other qualified health care                            professional performing a gastrointestinal                            endoscopic service that sedation supports,                            requiring the presence of an independent trained                            observer to assist in the monitoring of the  patient's level of consciousness and physiological                            status; initial 15 minutes of intra-service time;                            patient age 57 years or older (additional time may                            be reported with [phone], as appropriate) Diagnosis Code(s):        --- Professional ---                           K22.2, Esophageal obstruction                           K44.9, Diaphragmatic hernia without obstruction or                            gangrene CPT copyright 2019 American Medical Association. All rights reserved. The codes documented in this report are preliminary and upon coder review may  be revised to meet current compliance requirements. Hildred Laser, MD Hildred Laser, MD 02/24/2019 4:33:46 PM This report has been signed electronically. Number of Addenda: 0

## 2019-02-24 NOTE — Discharge Instructions (Signed)
Resume aspirin on 02/26/2019. Resume other medications as before. Resume usual diet. No driving for 24 hours. Please call office with progress report next week.      Upper Endoscopy, Adult, Care After This sheet gives you information about how to care for yourself after your procedure. Your health care provider may also give you more specific instructions. If you have problems or questions, contact your health care provider. What can I expect after the procedure? After the procedure, it is common to have:  A sore throat.  Mild stomach pain or discomfort.  Bloating.  Nausea. Follow these instructions at home:   Follow instructions from your health care provider about what to eat or drink after your procedure.  Return to your normal activities as told by your health care provider. Ask your health care provider what activities are safe for you.  Take over-the-counter and prescription medicines only as told by your health care provider.  Do not drive for 24 hours if you were given a sedative during your procedure.  Keep all follow-up visits as told by your health care provider. This is important. Contact a health care provider if you have:  A sore throat that lasts longer than one day.  Trouble swallowing. Get help right away if:  You vomit blood or your vomit looks like coffee grounds.  You have: ? A fever. ? Bloody, black, or tarry stools. ? A severe sore throat or you cannot swallow. ? Difficulty breathing. ? Severe pain in your chest or abdomen. Summary  After the procedure, it is common to have a sore throat, mild stomach discomfort, bloating, and nausea.  Do not drive for 24 hours if you were given a sedative during the procedure.  Follow instructions from your health care provider about what to eat or drink after your procedure.  Return to your normal activities as told by your health care provider. This information is not intended to replace advice given to  you by your health care provider. Make sure you discuss any questions you have with your health care provider. Document Released: 03/09/2012 Document Revised: 02/08/2018 Document Reviewed: 02/08/2018 Elsevier Interactive Patient Education  2019 [employer].

## 2019-02-25 ENCOUNTER — Telehealth: Payer: Medicare Other | Admitting: Cardiology

## 2019-02-25 DIAGNOSIS — M545 Low back pain: Secondary | ICD-10-CM | POA: Diagnosis not present

## 2019-03-03 ENCOUNTER — Encounter (HOSPITAL_COMMUNITY): Payer: Self-pay | Admitting: Internal Medicine

## 2019-03-09 ENCOUNTER — Telehealth (INDEPENDENT_AMBULATORY_CARE_PROVIDER_SITE_OTHER): Payer: Self-pay | Admitting: *Deleted

## 2019-03-09 NOTE — Telephone Encounter (Signed)
On 03/07/19 , patient's wife called the office. She states that "George Bradley" is doing well since his EGD last week . Improved somewhat.

## 2019-03-10 NOTE — Telephone Encounter (Signed)
Glad to hear that patient can swallow better. 

## 2019-03-19 ENCOUNTER — Other Ambulatory Visit (INDEPENDENT_AMBULATORY_CARE_PROVIDER_SITE_OTHER): Payer: Self-pay | Admitting: Internal Medicine

## 2019-03-19 DIAGNOSIS — G8929 Other chronic pain: Secondary | ICD-10-CM

## 2019-04-08 ENCOUNTER — Other Ambulatory Visit: Payer: Self-pay

## 2019-04-08 DIAGNOSIS — D472 Monoclonal gammopathy: Secondary | ICD-10-CM

## 2019-04-11 ENCOUNTER — Inpatient Hospital Stay: Payer: Medicare Other | Attending: Hematology and Oncology

## 2019-04-11 ENCOUNTER — Other Ambulatory Visit: Payer: Self-pay

## 2019-04-11 DIAGNOSIS — G620 Drug-induced polyneuropathy: Secondary | ICD-10-CM | POA: Diagnosis not present

## 2019-04-11 DIAGNOSIS — N189 Chronic kidney disease, unspecified: Secondary | ICD-10-CM | POA: Diagnosis not present

## 2019-04-11 DIAGNOSIS — D472 Monoclonal gammopathy: Secondary | ICD-10-CM | POA: Insufficient documentation

## 2019-04-11 DIAGNOSIS — Z85810 Personal history of malignant neoplasm of tongue: Secondary | ICD-10-CM | POA: Insufficient documentation

## 2019-04-11 DIAGNOSIS — T451X5A Adverse effect of antineoplastic and immunosuppressive drugs, initial encounter: Secondary | ICD-10-CM | POA: Insufficient documentation

## 2019-04-11 LAB — CBC WITH DIFFERENTIAL (CANCER CENTER ONLY)
Abs Immature Granulocytes: 0.01 10*3/uL (ref 0.00–0.07)
Basophils Absolute: 0 10*3/uL (ref 0.0–0.1)
Basophils Relative: 1 %
Eosinophils Absolute: 0.1 10*3/uL (ref 0.0–0.5)
Eosinophils Relative: 3 %
HCT: 39.5 % (ref 39.0–52.0)
Hemoglobin: 13.1 g/dL (ref 13.0–17.0)
Immature Granulocytes: 0 %
Lymphocytes Relative: 37 %
Lymphs Abs: 1.6 10*3/uL (ref 0.7–4.0)
MCH: 32.2 pg (ref 26.0–34.0)
MCHC: 33.2 g/dL (ref 30.0–36.0)
MCV: 97.1 fL (ref 80.0–100.0)
Monocytes Absolute: 0.5 10*3/uL (ref 0.1–1.0)
Monocytes Relative: 11 %
Neutro Abs: 2.1 10*3/uL (ref 1.7–7.7)
Neutrophils Relative %: 48 %
Platelet Count: 119 10*3/uL — ABNORMAL LOW (ref 150–400)
RBC: 4.07 MIL/uL — ABNORMAL LOW (ref 4.22–5.81)
RDW: 13.5 % (ref 11.5–15.5)
WBC Count: 4.4 10*3/uL (ref 4.0–10.5)
nRBC: 0 % (ref 0.0–0.2)

## 2019-04-11 LAB — CMP (CANCER CENTER ONLY)
ALT: 16 U/L (ref 0–44)
AST: 21 U/L (ref 15–41)
Albumin: 3.9 g/dL (ref 3.5–5.0)
Alkaline Phosphatase: 32 U/L — ABNORMAL LOW (ref 38–126)
Anion gap: 10 (ref 5–15)
BUN: 17 mg/dL (ref 8–23)
CO2: 26 mmol/L (ref 22–32)
Calcium: 8.9 mg/dL (ref 8.9–10.3)
Chloride: 106 mmol/L (ref 98–111)
Creatinine: 1.3 mg/dL — ABNORMAL HIGH (ref 0.61–1.24)
GFR, Est AFR Am: >60 mL/min (ref >60)
GFR, Estimated: 52 mL/min — ABNORMAL LOW (ref >60)
Glucose, Bld: 99 mg/dL (ref 70–99)
Potassium: 4.2 mmol/L (ref 3.5–5.1)
Sodium: 142 mmol/L (ref 135–145)
Total Bilirubin: 0.3 mg/dL (ref 0.3–1.2)
Total Protein: 7 g/dL (ref 6.5–8.1)

## 2019-04-12 LAB — MULTIPLE MYELOMA PANEL, SERUM
Albumin SerPl Elph-Mcnc: 3.9 g/dL (ref 2.9–4.4)
Albumin/Glob SerPl: 1.5 (ref 0.7–1.7)
Alpha 1: 0.2 g/dL (ref 0.0–0.4)
Alpha2 Glob SerPl Elph-Mcnc: 0.7 g/dL (ref 0.4–1.0)
B-Globulin SerPl Elph-Mcnc: 0.7 g/dL (ref 0.7–1.3)
Gamma Glob SerPl Elph-Mcnc: 1.1 g/dL (ref 0.4–1.8)
Globulin, Total: 2.7 g/dL (ref 2.2–3.9)
IgA: 119 mg/dL (ref 61–437)
IgG (Immunoglobin G), Serum: 1157 mg/dL (ref 603–1613)
IgM (Immunoglobulin M), Srm: 59 mg/dL (ref 15–143)
M Protein SerPl Elph-Mcnc: 0.6 g/dL — ABNORMAL HIGH
Total Protein ELP: 6.6 g/dL (ref 6.0–8.5)

## 2019-04-12 LAB — KAPPA/LAMBDA LIGHT CHAINS
Kappa free light chain: 20.6 mg/L — ABNORMAL HIGH (ref 3.3–19.4)
Kappa, lambda light chain ratio: 0.87 (ref 0.26–1.65)
Lambda free light chains: 23.7 mg/L (ref 5.7–26.3)

## 2019-04-18 ENCOUNTER — Telehealth: Payer: Self-pay | Admitting: Hematology and Oncology

## 2019-04-18 ENCOUNTER — Encounter: Payer: Self-pay | Admitting: Hematology and Oncology

## 2019-04-18 ENCOUNTER — Other Ambulatory Visit: Payer: Self-pay

## 2019-04-18 ENCOUNTER — Inpatient Hospital Stay (HOSPITAL_BASED_OUTPATIENT_CLINIC_OR_DEPARTMENT_OTHER): Payer: Medicare Other | Admitting: Hematology and Oncology

## 2019-04-18 DIAGNOSIS — Z85810 Personal history of malignant neoplasm of tongue: Secondary | ICD-10-CM | POA: Diagnosis not present

## 2019-04-18 DIAGNOSIS — T451X5A Adverse effect of antineoplastic and immunosuppressive drugs, initial encounter: Secondary | ICD-10-CM

## 2019-04-18 DIAGNOSIS — D472 Monoclonal gammopathy: Secondary | ICD-10-CM

## 2019-04-18 DIAGNOSIS — G620 Drug-induced polyneuropathy: Secondary | ICD-10-CM | POA: Diagnosis not present

## 2019-04-18 DIAGNOSIS — N189 Chronic kidney disease, unspecified: Secondary | ICD-10-CM

## 2019-04-18 MED ORDER — GABAPENTIN 300 MG PO CAPS: ORAL_CAPSULE | ORAL | 2 refills | Status: DC

## 2019-04-18 NOTE — Assessment & Plan Note (Signed)
The patient has been cancer free from the standpoint of tongue cancer No further workup is needed from the standpoint I reminded him to abstain from alcohol intake or tobacco use

## 2019-04-18 NOTE — Assessment & Plan Note (Signed)
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

## 2019-04-18 NOTE — Assessment & Plan Note (Signed)
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

## 2019-04-18 NOTE — Progress Notes (Signed)
[place] OFFICE PROGRESS NOTE  Patient Care Team: [name], MD as PCP - General (Internal Medicine) Rogene Houston, MD as Consulting Physician (Gastroenterology) Martinique, Peter M, MD as Consulting Physician (Cardiology)  ASSESSMENT & PLAN:  History of tongue cancer The patient has been cancer free from the standpoint of tongue cancer No further workup is needed from the standpoint I reminded him to abstain from alcohol intake or tobacco use  MGUS (monoclonal gammopathy of unknown significance) Clinically, he has no signs of end organ damage He had chronic kidney disease, stable, unrelated For that, I recommend once a year visit We discussed the natural history of MGUS I plan to see him back in 12 months with history, physical examination and blood work to be done a week prior to his appointment  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 CBC with Differential/Platelet    Standing Status:   Future    Standing Expiration Date:   05/22/2020  . Comprehensive metabolic panel    Standing Status:   Future    Standing Expiration Date:   05/22/2020  . Kappa/lambda light chains    Standing Status:   Future    Standing Expiration Date:   05/22/2020  . Multiple Myeloma Panel (SPEP&IFE w/QIG)    Standing Status:   Future    Standing Expiration Date:   05/22/2020    INTERVAL HISTORY: Please see below for problem oriented charting. He returns for further follow-up He is doing well He continues to take gabapentin for neuropathic pain without side effects He denies new tongue lesions No new bone pain no recent infection  SUMMARY OF ONCOLOGIC  HISTORY:  The patient was diagnosed with tongue cancer around July of 2012 at the presentation with the lump. PET/CT scan showed no evidence of distant metastatic disease however he does have evidence of lymphadenopathy. He was offered concurrent chemoradiation therapy with weekly cisplatin between August to October 2012. From 2012 to 2014 he had serial imaging studies which show no evidence of disease recurrence. 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  He is being observed  REVIEW OF SYSTEMS:   Constitutional: Denies fevers, chills or abnormal weight loss Eyes: Denies blurriness of vision Ears, nose, mouth, throat, and face: Denies mucositis or sore throat Respiratory: Denies cough, dyspnea or wheezes Cardiovascular: Denies palpitation, chest discomfort or lower extremity swelling Gastrointestinal:  Denies nausea, heartburn or change in bowel habits Skin: Denies abnormal skin rashes Lymphatics: Denies new lymphadenopathy or easy bruising Neurological:Denies numbness, tingling or new weaknesses Behavioral/Psych: Mood is stable, no new changes  All other systems were reviewed with the patient and are negative.  I have reviewed the past medical history, past surgical history, social history and family history with the patient and they are unchanged from previous note.  ALLERGIES:  is allergic to zetia [ezetimibe].  MEDICATIONS:  Current Outpatient Medications  Medication Sig Dispense Refill  . aspirin EC 81 MG tablet Take 1 tablet (81 mg total) by mouth at bedtime.    . celecoxib (CELEBREX) 100 MG capsule Take 100 mg by mouth daily.    . cetirizine (  ZYRTEC) 10 MG tablet Take 10 mg by mouth daily.    . Evolocumab (REPATHA SURECLICK) 509 MG/ML SOAJ Inject 140 mg into the skin every 14 (fourteen) days. 2 pen 11  . fludrocortisone (FLORINEF) 0.1 MG tablet Take 0.1 mg by mouth daily.    . fluticasone (FLONASE) 50 MCG/ACT  nasal spray Place 1 spray into both nostrils at bedtime.    . gabapentin (NEURONTIN) 300 MG capsule Take 1 in the morning, 2 in the afternoon and 1 at night 270 capsule 2  . levothyroxine (SYNTHROID, LEVOTHROID) 100 MCG tablet TAKE ONE TABLET BY MOUTH ONCE DAILY BEFORE  BREAKFAST (Patient taking differently: Take 100 mcg by mouth daily before breakfast. ) 90 tablet 0  . LORazepam (ATIVAN) 1 MG tablet TAKE 1 TABLET BY MOUTH THREE TIMES DAILY AS NEEDED 90 tablet 0  . Multiple Vitamin (MULTIVITAMIN WITH MINERALS) TABS tablet Take 1 tablet by mouth daily.    . pantoprazole (PROTONIX) 40 MG tablet TAKE 1 TABLET BY MOUTH EVERY DAY BEFORE BREAKFAST (Patient taking differently: Take 40 mg by mouth daily as needed (acid reflux). ) 30 tablet 11  . pilocarpine (SALAGEN) 7.5 MG tablet TAKE 1 TABLET BY MOUTH THREE TIMES DAILY 270 tablet 3  . polyethylene glycol (MIRALAX / GLYCOLAX) packet Take 17 g by mouth daily as needed for mild constipation.     . rosuvastatin (CRESTOR) 20 MG tablet Take 1 tablet (20 mg total) by mouth daily. 90 tablet 1  . zolpidem (AMBIEN) 10 MG tablet Take 10 mg by mouth at bedtime as needed for sleep. Reported on 03/26/2016     No current facility-administered medications for this visit.     PHYSICAL EXAMINATION: ECOG PERFORMANCE STATUS: 0 - Asymptomatic  Vitals:   04/18/19 0841  BP: 110/61  Pulse: (!) 54  Resp: 17  Temp: 98 F (36.7 C)  SpO2: 98%   Filed Weights   04/18/19 0841  Weight: 175 lb 12.8 oz (79.7 kg)    GENERAL:alert, no distress and comfortable Musculoskeletal:no cyanosis of digits and no clubbing  NEURO: alert & oriented x 3 with fluent speech, no focal motor/sensory deficits  LABORATORY DATA:  I have reviewed the data as listed    Component Value Date/Time   NA 142 04/11/2019 0740   NA 140 08/21/2017 0952   K 4.2 04/11/2019 0740   K 5.1 08/21/2017 0952   CL 106 04/11/2019 0740   CL 106 11/25/2012 0812   CO2 26 04/11/2019 0740   CO2 25 08/21/2017  0952   GLUCOSE 99 04/11/2019 0740   GLUCOSE 92 08/21/2017 0952   GLUCOSE 102 (H) 11/25/2012 0812   BUN 17 04/11/2019 0740   BUN 33.9 (H) 08/21/2017 0952   CREATININE 1.30 (H) 04/11/2019 0740   CREATININE 1.6 (H) 08/21/2017 0952   CALCIUM 8.9 04/11/2019 0740   CALCIUM 10.0 08/21/2017 0952   PROT 7.0 04/11/2019 0740   PROT 7.3 05/20/2018 0820   PROT 8.5 (H) 08/21/2017 0952   ALBUMIN 3.9 04/11/2019 0740   ALBUMIN 4.6 05/20/2018 0820   ALBUMIN 4.2 08/21/2017 0952   AST 21 04/11/2019 0740   AST 23 08/21/2017 0952   ALT 16 04/11/2019 0740   ALT 19 08/21/2017 0952   ALKPHOS 32 (L) 04/11/2019 0740   ALKPHOS 40 08/21/2017 0952   BILITOT 0.3 04/11/2019 0740   BILITOT 0.26 08/21/2017 0952   GFRNONAA 52 (L) 04/11/2019 0740   GFRAA >60 04/11/2019 0740    No results found for: SPEP, UPEP  Lab  Results  Component Value Date   WBC 4.4 04/11/2019   NEUTROABS 2.1 04/11/2019   HGB 13.1 04/11/2019   HCT 39.5 04/11/2019   MCV 97.1 04/11/2019   PLT 119 (L) 04/11/2019      Chemistry      Component Value Date/Time   NA 142 04/11/2019 0740   NA 140 08/21/2017 0952   K 4.2 04/11/2019 0740   K 5.1 08/21/2017 0952   CL 106 04/11/2019 0740   CL 106 11/25/2012 0812   CO2 26 04/11/2019 0740   CO2 25 08/21/2017 0952   BUN 17 04/11/2019 0740   BUN 33.9 (H) 08/21/2017 0952   CREATININE 1.30 (H) 04/11/2019 0740   CREATININE 1.6 (H) 08/21/2017 0952      Component Value Date/Time   CALCIUM 8.9 04/11/2019 0740   CALCIUM 10.0 08/21/2017 0952   ALKPHOS 32 (L) 04/11/2019 0740   ALKPHOS 40 08/21/2017 0952   AST 21 04/11/2019 0740   AST 23 08/21/2017 0952   ALT 16 04/11/2019 0740   ALT 19 08/21/2017 0952   BILITOT 0.3 04/11/2019 0740   BILITOT 0.26 08/21/2017 0952     All questions were answered. The patient knows to call the clinic with any problems, questions or concerns. No barriers to learning was detected.  I spent 10 minutes counseling the patient face to face. The total time spent  in the appointment was 15 minutes and more than 50% was on counseling and review of test results  Heath Lark, MD 04/18/2019 10:46 AM

## 2019-04-18 NOTE — Telephone Encounter (Signed)
I left a message regarding schedule  

## 2019-04-19 ENCOUNTER — Other Ambulatory Visit (INDEPENDENT_AMBULATORY_CARE_PROVIDER_SITE_OTHER): Payer: Self-pay | Admitting: Internal Medicine

## 2019-04-19 DIAGNOSIS — G8929 Other chronic pain: Secondary | ICD-10-CM

## 2019-04-29 ENCOUNTER — Other Ambulatory Visit: Payer: Self-pay

## 2019-04-29 MED ORDER — ROSUVASTATIN CALCIUM 20 MG PO TABS: 20.0000 mg | ORAL_TABLET | Freq: Every day | ORAL | 1 refills | Status: DC

## 2019-05-06 ENCOUNTER — Other Ambulatory Visit: Payer: Self-pay | Admitting: Hematology and Oncology

## 2019-05-13 ENCOUNTER — Telehealth: Payer: Medicare Other | Admitting: Cardiology

## 2019-05-17 ENCOUNTER — Other Ambulatory Visit (INDEPENDENT_AMBULATORY_CARE_PROVIDER_SITE_OTHER): Payer: Self-pay | Admitting: Internal Medicine

## 2019-05-17 DIAGNOSIS — G8929 Other chronic pain: Secondary | ICD-10-CM

## 2019-05-25 DIAGNOSIS — L570 Actinic keratosis: Secondary | ICD-10-CM | POA: Diagnosis not present

## 2019-05-25 DIAGNOSIS — L821 Other seborrheic keratosis: Secondary | ICD-10-CM | POA: Diagnosis not present

## 2019-05-25 DIAGNOSIS — C44320 Squamous cell carcinoma of skin of unspecified parts of face: Secondary | ICD-10-CM | POA: Diagnosis not present

## 2019-05-25 DIAGNOSIS — L910 Hypertrophic scar: Secondary | ICD-10-CM | POA: Diagnosis not present

## 2019-05-25 DIAGNOSIS — D1801 Hemangioma of skin and subcutaneous tissue: Secondary | ICD-10-CM | POA: Diagnosis not present

## 2019-05-25 DIAGNOSIS — D485 Neoplasm of uncertain behavior of skin: Secondary | ICD-10-CM | POA: Diagnosis not present

## 2019-05-25 DIAGNOSIS — C44329 Squamous cell carcinoma of skin of other parts of face: Secondary | ICD-10-CM | POA: Diagnosis not present

## 2019-06-13 DIAGNOSIS — Z85828 Personal history of other malignant neoplasm of skin: Secondary | ICD-10-CM | POA: Diagnosis not present

## 2019-06-13 DIAGNOSIS — C44329 Squamous cell carcinoma of skin of other parts of face: Secondary | ICD-10-CM | POA: Diagnosis not present

## 2019-06-20 ENCOUNTER — Other Ambulatory Visit (INDEPENDENT_AMBULATORY_CARE_PROVIDER_SITE_OTHER): Payer: Self-pay | Admitting: Internal Medicine

## 2019-06-20 DIAGNOSIS — G8929 Other chronic pain: Secondary | ICD-10-CM

## 2019-06-25 DIAGNOSIS — Z23 Encounter for immunization: Secondary | ICD-10-CM | POA: Diagnosis not present

## 2019-06-27 DIAGNOSIS — Z85828 Personal history of other malignant neoplasm of skin: Secondary | ICD-10-CM | POA: Diagnosis not present

## 2019-06-27 DIAGNOSIS — L910 Hypertrophic scar: Secondary | ICD-10-CM | POA: Diagnosis not present

## 2019-07-05 ENCOUNTER — Encounter (INDEPENDENT_AMBULATORY_CARE_PROVIDER_SITE_OTHER): Payer: Self-pay | Admitting: *Deleted

## 2019-07-05 ENCOUNTER — Encounter (INDEPENDENT_AMBULATORY_CARE_PROVIDER_SITE_OTHER): Payer: Self-pay | Admitting: Internal Medicine

## 2019-07-05 ENCOUNTER — Ambulatory Visit (INDEPENDENT_AMBULATORY_CARE_PROVIDER_SITE_OTHER): Payer: Medicare Other | Admitting: Internal Medicine

## 2019-07-05 ENCOUNTER — Other Ambulatory Visit: Payer: Self-pay

## 2019-07-05 VITALS — BP 166/83 | HR 50 | Temp 98.0°F | Ht 73.0 in | Wt 173.1 lb

## 2019-07-05 DIAGNOSIS — R1310 Dysphagia, unspecified: Secondary | ICD-10-CM | POA: Diagnosis not present

## 2019-07-05 DIAGNOSIS — K222 Esophageal obstruction: Secondary | ICD-10-CM | POA: Insufficient documentation

## 2019-07-05 DIAGNOSIS — R1013 Epigastric pain: Secondary | ICD-10-CM | POA: Diagnosis not present

## 2019-07-05 DIAGNOSIS — R1319 Other dysphagia: Secondary | ICD-10-CM

## 2019-07-05 NOTE — Progress Notes (Signed)
Presenting complaint;  Patient complains of dysphagia.  He has history of radiation-induced esophageal stricture. History of epigastric pain.  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  He developed esophageal stricture and has undergone multiple esophageal dilations most recently 4 months ago.  He also has a history of MGUS and is being followed by Dr. Alvy Bimler. He has chronic epigastric pain which was felt to be due to neuropathy and perhaps related to back to which subsequently removed.  He also has a history of SMV thrombosis which resolved with anticoagulation.    Patient states he is doing well as far as epigastric pain is concerned.  He feels control is satisfactory with gabapentin and lorazepam.  He does experience some pain when he exercises which is virtually every day.  He continues to complain of swallowing difficulty.  He states he has this symptom virtually every day.  He takes sips of water very frequently.  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  He has not had any episode of food impaction.  He has most difficulty with steak.  However he has very little difficulty with fillet and he has no problem with bread.  He takes PPI no more than once a week.  He is watching his diet.  Similarly he does not take MiraLAX very often.  He says constipation improve with dose reduction and Crestor.  He has lost 9 pounds since his last visit which he attributes to vigorous physical activity.  He walks on treadmill for 45 minutes at least 5 days each week.  He also lifts weights and does stretches. Patient states he had small skin cancer removed.  His dermatologist injected keloids with steroids.  He reports remarkable response with decrease in pain.  Current Medications: Outpatient Encounter Medications as of 07/05/2019  Medication Sig  . aspirin EC 81 MG tablet Take 1  tablet (81 mg total) by mouth at bedtime.  . celecoxib (CELEBREX) 100 MG capsule Take 100 mg by mouth 2 (two) times daily.   . cetirizine (ZYRTEC) 10 MG tablet Take 10 mg by mouth daily.  . Cyanocobalamin (VITAMIN B-12) 5000 MCG TBDP Take by mouth daily.  . Evolocumab (REPATHA SURECLICK) 448 MG/ML SOAJ Inject 140 mg into the skin every 14 (fourteen) days.  . fludrocortisone (FLORINEF) 0.1 MG tablet Take 0.1 mg by mouth daily.  . fluticasone (FLONASE) 50 MCG/ACT nasal spray Place 2 sprays into both nostrils at bedtime.   . gabapentin (NEURONTIN) 300 MG capsule TAKE 1 CAPSULE(300 MG) BY MOUTH THREE TIMES DAILY  . levothyroxine (SYNTHROID, LEVOTHROID) 100 MCG tablet TAKE ONE TABLET BY MOUTH ONCE DAILY BEFORE  BREAKFAST (Patient taking differently: Take 100 mcg by mouth daily before breakfast. )  . LORazepam (ATIVAN) 1 MG tablet TAKE 1 TABLET BY MOUTH THREE TIMES DAILY AS NEEDED  . Multiple Vitamin (MULTIVITAMIN WITH MINERALS) TABS tablet Take 1 tablet by mouth daily.  . pantoprazole (PROTONIX) 40 MG tablet TAKE 1 TABLET BY MOUTH EVERY DAY BEFORE BREAKFAST (Patient taking differently: Take 40 mg by mouth daily as needed (acid reflux). )  . pilocarpine (SALAGEN) 7.5 MG tablet TAKE 1 TABLET BY MOUTH THREE TIMES DAILY  . polyethylene glycol (MIRALAX / GLYCOLAX) packet Take 17 g by mouth daily as needed for mild constipation.   . rosuvastatin (CRESTOR) 20 MG tablet Take 1 tablet (20 mg total) by mouth daily.  . TRAZODONE  HCL PO Take 50 mg by mouth daily.  Marland Kitchen zolpidem (AMBIEN) 10 MG tablet Take 10 mg by mouth at bedtime as needed for sleep. Reported on 03/26/2016   No facility-administered encounter medications on file as of 07/05/2019.      Objective: Blood pressure (!) 166/83, pulse (!) 50, temperature 98 F (36.7 C), temperature source Oral, height _0  (1.854 m), weight 173 lb 1.6 oz (78.5 kg). Patient is alert and in no acute distress. Conjunctiva is pink. Sclera is nonicteric Oropharyngeal  mucosa is normal. No neck masses or thyromegaly noted. He has midsternal scar with keloids. Cardiac exam with regular rhythm normal S1 and S2.  He has grade 2/6 systolic murmur at upper left sternal border. Lungs are clear to auscultation. Abdomen is symmetrical.  He has scar from previous PEG placement.  He has mild midepigastric tenderness. No LE edema or clubbing noted.  Labs/studies Results:  CBC Latest Ref Rng & Units 04/11/2019 03/29/2018 08/21/2017  WBC 4.0 - 10.5 K/uL 4.4 4.4 6.4  Hemoglobin 13.0 - 17.0 g/dL 13.1 12.9(L) 13.6  Hematocrit 39.0 - 52.0 % 39.5 38.8 40.7  Platelets 150 - 400 K/uL 119(L) 106(L) 138(L)    CMP Latest Ref Rng & Units 04/11/2019 05/20/2018 03/29/2018  Glucose 70 - 99 mg/dL 99 - 95  BUN 8 - 23 mg/dL 17 - 15  Creatinine 0.61 - 1.24 mg/dL 1.30(H) - 1.26(H)  Sodium 135 - 145 mmol/L 142 - 143  Potassium 3.5 - 5.1 mmol/L 4.2 - 4.0  Chloride 98 - 111 mmol/L 106 - 108  CO2 22 - 32 mmol/L 26 - 27  Calcium 8.9 - 10.3 mg/dL 8.9 - 9.5  Total Protein 6.5 - 8.1 g/dL 7.0 7.3 7.2  Total Bilirubin 0.3 - 1.2 mg/dL 0.3 0.3 0.4  Alkaline Phos 38 - 126 U/L 32(L) 31(L) 32(L)  AST 15 - 41 U/L _1 ALT 0 - 44 U/L _2 Hepatic Function Latest Ref Rng & Units 04/11/2019 05/20/2018 03/29/2018  Total Protein 6.5 - 8.1 g/dL 7.0 7.3 7.2  Albumin 3.5 - 5.0 g/dL 3.9 4.6 4.1  AST 15 - 41 U/L _3 ALT 0 - 44 U/L _4 Alk Phosphatase 38 - 126 U/L 32(L) 31(L) 32(L)  Total Bilirubin 0.3 - 1.2 mg/dL 0.3 0.3 0.4  Bilirubin, Direct 0.00 - 0.40 mg/dL - 0.12 -     Assessment:  #1.  Esophageal dysphagia.  He has history of radiation-induced proximal esophageal stricture which has been dilated multiple times most recently about 4 months ago to a diameter of 54 mm equivalent to 18 mm.  Endoscopically he had mild stricture.  He remains with daily dysphagia which would appear to be due to xerostomia and he most likely has impaired esophageal motility in the segment.  However  need to rule out recurrence of stricture given daily symptoms. Patient is a slow eater and chooses food thoroughly which decreases likelihood of food impaction.  #2.  Chronic epigastric pain responding to gabapentin and lorazepam most likely neuropathic related to nerve damage due to chemotherapy.   Plan:  Will continue to fill patient's lorazepam every month. Barium pill esophagogram. Office visit in 1 year.

## 2019-07-05 NOTE — Patient Instructions (Signed)
Physician will call with results of barium study when completed. 

## 2019-07-13 ENCOUNTER — Other Ambulatory Visit (INDEPENDENT_AMBULATORY_CARE_PROVIDER_SITE_OTHER): Payer: Self-pay | Admitting: Internal Medicine

## 2019-07-13 ENCOUNTER — Other Ambulatory Visit: Payer: Self-pay

## 2019-07-13 ENCOUNTER — Ambulatory Visit (HOSPITAL_COMMUNITY)
Admission: RE | Admit: 2019-07-13 | Discharge: 2019-07-13 | Disposition: A | Discharge status: Home / Self Care | Payer: Medicare Other | Source: Ambulatory Visit | Attending: Internal Medicine | Admitting: Internal Medicine

## 2019-07-13 DIAGNOSIS — K222 Esophageal obstruction: Secondary | ICD-10-CM

## 2019-07-13 DIAGNOSIS — R1310 Dysphagia, unspecified: Secondary | ICD-10-CM | POA: Diagnosis not present

## 2019-07-13 DIAGNOSIS — R1319 Other dysphagia: Secondary | ICD-10-CM

## 2019-07-13 DIAGNOSIS — C109 Malignant neoplasm of oropharynx, unspecified: Secondary | ICD-10-CM | POA: Diagnosis not present

## 2019-07-14 ENCOUNTER — Other Ambulatory Visit (HOSPITAL_COMMUNITY): Payer: Self-pay | Admitting: Specialist

## 2019-07-14 DIAGNOSIS — R1319 Other dysphagia: Secondary | ICD-10-CM

## 2019-07-14 DIAGNOSIS — K219 Gastro-esophageal reflux disease without esophagitis: Secondary | ICD-10-CM

## 2019-07-18 ENCOUNTER — Other Ambulatory Visit: Payer: Self-pay

## 2019-07-18 ENCOUNTER — Ambulatory Visit (HOSPITAL_COMMUNITY)
Admission: RE | Admit: 2019-07-18 | Discharge: 2019-07-18 | Disposition: A | Discharge status: Home / Self Care | Payer: Medicare Other | Source: Ambulatory Visit | Attending: Internal Medicine | Admitting: Internal Medicine

## 2019-07-18 ENCOUNTER — Encounter (HOSPITAL_COMMUNITY): Payer: Self-pay | Admitting: Speech Pathology

## 2019-07-18 ENCOUNTER — Ambulatory Visit (HOSPITAL_COMMUNITY): Payer: Medicare Other | Attending: Internal Medicine | Admitting: Speech Pathology

## 2019-07-18 DIAGNOSIS — R1312 Dysphagia, oropharyngeal phase: Secondary | ICD-10-CM | POA: Insufficient documentation

## 2019-07-18 DIAGNOSIS — K219 Gastro-esophageal reflux disease without esophagitis: Secondary | ICD-10-CM | POA: Insufficient documentation

## 2019-07-18 DIAGNOSIS — R1319 Other dysphagia: Secondary | ICD-10-CM | POA: Insufficient documentation

## 2019-07-18 DIAGNOSIS — R1310 Dysphagia, unspecified: Secondary | ICD-10-CM | POA: Diagnosis not present
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
## 2019-07-18 NOTE — Addendum Note (Signed)
Addended by: Ephraim Hamburger on: 07/18/2019 02:57 PM   Modules accepted: Orders

## 2019-07-19 ENCOUNTER — Telehealth (HOSPITAL_COMMUNITY): Payer: Self-pay | Admitting: Speech Pathology

## 2019-07-19 DIAGNOSIS — H5211 Myopia, right eye: Secondary | ICD-10-CM | POA: Diagnosis not present

## 2019-07-19 DIAGNOSIS — H4911 Fourth [trochlear] nerve palsy, right eye: Secondary | ICD-10-CM | POA: Diagnosis not present

## 2019-07-19 NOTE — Telephone Encounter (Signed)
Called George Bradley to schedule one ST visit-- no answer will it be an eval or tx and do we need a referral froCalled George Bradley to schedule one ST visit-- no answer will it be an eval or tx and do we need a referral from MD for this visit?

## 2019-07-21 ENCOUNTER — Other Ambulatory Visit (INDEPENDENT_AMBULATORY_CARE_PROVIDER_SITE_OTHER): Payer: Self-pay | Admitting: Internal Medicine

## 2019-07-21 DIAGNOSIS — M545 Low back pain: Secondary | ICD-10-CM | POA: Diagnosis not present

## 2019-07-21 DIAGNOSIS — R1013 Epigastric pain: Secondary | ICD-10-CM

## 2019-07-21 DIAGNOSIS — G8929 Other chronic pain: Secondary | ICD-10-CM

## 2019-07-26 ENCOUNTER — Ambulatory Visit (HOSPITAL_COMMUNITY): Payer: Medicare Other | Attending: Internal Medicine | Admitting: Speech Pathology

## 2019-07-26 ENCOUNTER — Other Ambulatory Visit: Payer: Self-pay | Admitting: Orthopedic Surgery

## 2019-07-26 ENCOUNTER — Encounter (HOSPITAL_COMMUNITY): Payer: Self-pay | Admitting: Speech Pathology

## 2019-07-26 ENCOUNTER — Other Ambulatory Visit: Payer: Self-pay

## 2019-07-26 DIAGNOSIS — R1312 Dysphagia, oropharyngeal phase: Secondary | ICD-10-CM | POA: Insufficient documentation

## 2019-07-26 DIAGNOSIS — G8929 Other chronic pain: Secondary | ICD-10-CM

## 2019-07-26 NOTE — Telephone Encounter (Signed)
Spoke with patient's wife to screen his medications before being scheduled for a myelogram. They were informed he needs to hold Buspar, Tramadol and Trazodone for 48 hours before, and 24 hours after, the myelogram.  Also, they were informed he will be here about two hours, will need a driver and will need to be on strict bedrest for 24 hours after the procedure.
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
## 2019-07-28 DIAGNOSIS — Z85828 Personal history of other malignant neoplasm of skin: Secondary | ICD-10-CM | POA: Diagnosis not present

## 2019-07-28 DIAGNOSIS — L910 Hypertrophic scar: Secondary | ICD-10-CM | POA: Diagnosis not present

## 2019-08-02 ENCOUNTER — Ambulatory Visit
Admission: RE | Admit: 2019-08-02 | Discharge: 2019-08-02 | Disposition: A | Discharge status: Home / Self Care | Payer: Medicare Other | Source: Ambulatory Visit | Attending: Orthopedic Surgery | Admitting: Orthopedic Surgery

## 2019-08-02 ENCOUNTER — Other Ambulatory Visit: Payer: Self-pay

## 2019-08-02 DIAGNOSIS — M5124 Other intervertebral disc displacement, thoracic region: Secondary | ICD-10-CM | POA: Diagnosis not present

## 2019-08-02 DIAGNOSIS — M5127 Other intervertebral disc displacement, lumbosacral region: Secondary | ICD-10-CM | POA: Diagnosis not present

## 2019-08-02 DIAGNOSIS — M48061 Spinal stenosis, lumbar region without neurogenic claudication: Secondary | ICD-10-CM | POA: Diagnosis not present

## 2019-08-02 DIAGNOSIS — M545 Low back pain, unspecified: Secondary | ICD-10-CM

## 2019-08-02 DIAGNOSIS — G8929 Other chronic pain: Secondary | ICD-10-CM

## 2019-08-02 DIAGNOSIS — M4316 Spondylolisthesis, lumbar region: Secondary | ICD-10-CM | POA: Diagnosis not present

## 2019-08-02 MED ORDER — DIAZEPAM 5 MG PO TABS
5.0000 mg | ORAL_TABLET | Freq: Once | ORAL | Status: AC
  Administered 2019-08-02: 5 mg via ORAL

## 2019-08-02 MED ORDER — IOPAMIDOL (ISOVUE-M 200) INJECTION 41%
18.0000 mL | Freq: Once | INTRAMUSCULAR | Status: AC
  Administered 2019-08-02: 18 mL via INTRATHECAL

## 2019-08-02 NOTE — Discharge Instructions (Signed)
Myelogram Discharge Instructions  1. Go home and rest quietly for the next 24 hours.  It is important to lie flat for the next 24 hours.  Get up only to go to the restroom.  You may lie in the bed or on a couch on your back, your stomach, your left side or your right side.  You may have one pillow under your head.  You may have pillows between your knees while you are on your side or under your knees while you are on your back.  2. DO NOT drive today.  Recline the seat as far back as it will go, while still wearing your seat belt, on the way home.  3. You may get up to go to the bathroom as needed.  You may sit up for 10 minutes to eat.  You may resume your normal diet and medications unless otherwise indicated.  Drink lots of extra fluids today and tomorrow.  4. The incidence of headache, nausea, or vomiting is about 5% (one in 20 patients).  If you develop a headache, lie flat and drink plenty of fluids until the headache goes away.  Caffeinated beverages may be helpful.  If you develop severe nausea and vomiting or a headache that does not go away with flat bed rest, call [phone].  5. You may resume normal activities after your 24 hours of bed rest is over; however, do not exert yourself strongly or do any heavy lifting tomorrow. If when you get up you have a headache when standing, go back to bed and force fluids for another 24 hours.  6. Call your physician for a follow-up appointment.  The results of your myelogram will be sent directly to your physician by the following day.  7. If you have any questions or if complications develop after you arrive home, please call [phone].  Discharge instructions have been explained to the patient.  The patient, or the person responsible for the patient, fully understands these instructions.  YOU MAY RESTART YOUR TRAZODONE, BUSPIRONE AND TRAMADOL TOMORROW 08/03/19 AT 09:30AM.

## 2019-08-09 DIAGNOSIS — H532 Diplopia: Secondary | ICD-10-CM | POA: Diagnosis not present

## 2019-08-09 DIAGNOSIS — I700 Atherosclerosis of aorta: Secondary | ICD-10-CM | POA: Diagnosis not present

## 2019-08-09 DIAGNOSIS — R1319 Other dysphagia: Secondary | ICD-10-CM | POA: Diagnosis not present

## 2019-08-09 DIAGNOSIS — C029 Malignant neoplasm of tongue, unspecified: Secondary | ICD-10-CM | POA: Diagnosis not present

## 2019-08-09 DIAGNOSIS — I951 Orthostatic hypotension: Secondary | ICD-10-CM | POA: Diagnosis not present

## 2019-08-09 DIAGNOSIS — E039 Hypothyroidism, unspecified: Secondary | ICD-10-CM | POA: Diagnosis not present

## 2019-08-09 DIAGNOSIS — H4911 Fourth [trochlear] nerve palsy, right eye: Secondary | ICD-10-CM | POA: Diagnosis not present

## 2019-08-09 DIAGNOSIS — M549 Dorsalgia, unspecified: Secondary | ICD-10-CM | POA: Diagnosis not present

## 2019-08-09 DIAGNOSIS — D892 Hypergammaglobulinemia, unspecified: Secondary | ICD-10-CM | POA: Diagnosis not present

## 2019-08-09 DIAGNOSIS — N1831 Chronic kidney disease, stage 3a: Secondary | ICD-10-CM | POA: Diagnosis not present

## 2019-08-09 DIAGNOSIS — R030 Elevated blood-pressure reading, without diagnosis of hypertension: Secondary | ICD-10-CM | POA: Diagnosis not present

## 2019-08-09 DIAGNOSIS — C44300 Unspecified malignant neoplasm of skin of unspecified part of face: Secondary | ICD-10-CM | POA: Diagnosis not present

## 2019-08-15 DIAGNOSIS — M545 Low back pain: Secondary | ICD-10-CM | POA: Diagnosis not present

## 2019-08-20 ENCOUNTER — Other Ambulatory Visit (INDEPENDENT_AMBULATORY_CARE_PROVIDER_SITE_OTHER): Payer: Self-pay | Admitting: Internal Medicine

## 2019-08-20 DIAGNOSIS — G8929 Other chronic pain: Secondary | ICD-10-CM

## 2019-08-20 DIAGNOSIS — R1013 Epigastric pain: Secondary | ICD-10-CM

## 2019-08-22 NOTE — Telephone Encounter (Signed)
Patient called stated [facility] had sent over a refill request on his Ativan - stated he will be running out on Tuesday, 12/1   -  Please advise

## 2019-09-01 DIAGNOSIS — M5416 Radiculopathy, lumbar region: Secondary | ICD-10-CM | POA: Diagnosis not present

## 2019-10-14 ENCOUNTER — Ambulatory Visit: Payer: Medicare Other | Attending: Internal Medicine

## 2019-10-14 DIAGNOSIS — Z23 Encounter for immunization: Secondary | ICD-10-CM | POA: Insufficient documentation

## 2019-10-14 NOTE — Progress Notes (Signed)
   Covid-19 Vaccination Clinic  Name:  George Bradley    MRN: [id number] DOB: 01-22-1941  10/14/2019  [name] was observed post Covid-19 immunization for 15 minutes without incidence. He was provided with Vaccine Information Sheet and instruction to access the V-Safe system.   [name] was instructed to call 911 with any severe reactions post vaccine: Marland Kitchen Difficulty breathing  . Swelling of your face and throat  . A fast heartbeat  . A bad rash all over your body  . Dizziness and weakness    Immunizations Administered    Name Date Dose VIS Date Route   Pfizer COVID-19 Vaccine 10/14/2019 12:32 PM 0.3 mL 09/02/2019 Intramuscular   Manufacturer: [place]   Lot: [id number]   [place]: [id number]

## 2019-10-31 ENCOUNTER — Other Ambulatory Visit: Payer: Self-pay | Admitting: Cardiology

## 2019-10-31 MED ORDER — ROSUVASTATIN CALCIUM 20 MG PO TABS: 20.0000 mg | ORAL_TABLET | Freq: Every day | ORAL | 0 refills | Status: DC

## 2019-10-31 NOTE — Telephone Encounter (Signed)
*  STAT* If patient is at the pharmacy, call can be transferred to refill team.   1. Which medications need to be refilled? (please list name of each medication and dose if known) rosuvastatin (CRESTOR) 20 MG tablet  2. Which pharmacy/location (including street and city if local pharmacy) is medication to be sent to? [facility] [place] [place] [facility] [address]  3. Do they need a 30 day or 90 day supply? 90 day with 3 refills  Patient only has 3 pills left.

## 2019-11-02 ENCOUNTER — Ambulatory Visit: Payer: Medicare Other | Attending: Internal Medicine

## 2019-11-02 DIAGNOSIS — Z23 Encounter for immunization: Secondary | ICD-10-CM | POA: Insufficient documentation

## 2019-11-02 NOTE — Progress Notes (Signed)
   Covid-19 Vaccination Clinic  Name:  George Bradley    MRN: [id number] DOB: [birth year]  11/02/2019  [name] was observed post Covid-19 immunization for 15 minutes without incidence. He was provided with Vaccine Information Sheet and instruction to access the V-Safe system.   [name] was instructed to call 911 with any severe reactions post vaccine: Marland Kitchen Difficulty breathing  . Swelling of your face and throat  . A fast heartbeat  . A bad rash all over your body  . Dizziness and weakness    Immunizations Administered    Name Date Dose VIS Date Route   Pfizer COVID-19 Vaccine 11/02/2019 12:20 PM 0.3 mL 09/02/2019 Intramuscular   Manufacturer: [place]   Lot: [id number]   [place]: [id number]

## 2019-11-14 DIAGNOSIS — L910 Hypertrophic scar: Secondary | ICD-10-CM | POA: Diagnosis not present

## 2019-11-14 DIAGNOSIS — Z85828 Personal history of other malignant neoplasm of skin: Secondary | ICD-10-CM | POA: Diagnosis not present

## 2019-11-15 ENCOUNTER — Other Ambulatory Visit (INDEPENDENT_AMBULATORY_CARE_PROVIDER_SITE_OTHER): Payer: Self-pay | Admitting: Internal Medicine

## 2019-11-15 DIAGNOSIS — G8929 Other chronic pain: Secondary | ICD-10-CM

## 2019-11-23 ENCOUNTER — Other Ambulatory Visit: Payer: Self-pay

## 2019-11-23 MED ORDER — ROSUVASTATIN CALCIUM 20 MG PO TABS: 20.0000 mg | ORAL_TABLET | Freq: Every day | ORAL | 0 refills | Status: DC

## 2019-11-25 ENCOUNTER — Other Ambulatory Visit: Payer: Self-pay

## 2019-11-26 ENCOUNTER — Other Ambulatory Visit: Payer: Self-pay | Admitting: Hematology and Oncology

## 2019-11-30 ENCOUNTER — Other Ambulatory Visit: Payer: Self-pay

## 2019-11-30 ENCOUNTER — Telehealth: Payer: Self-pay

## 2019-11-30 MED ORDER — GABAPENTIN 300 MG PO CAPS: ORAL_CAPSULE | ORAL | 2 refills | Status: DC

## 2019-11-30 NOTE — Telephone Encounter (Signed)
lvm for pt letting him know his prescription for gabapentin has been sent to the pharmacy

## 2019-12-12 DIAGNOSIS — Z85828 Personal history of other malignant neoplasm of skin: Secondary | ICD-10-CM | POA: Diagnosis not present

## 2019-12-12 DIAGNOSIS — L910 Hypertrophic scar: Secondary | ICD-10-CM | POA: Diagnosis not present

## 2019-12-15 ENCOUNTER — Other Ambulatory Visit (INDEPENDENT_AMBULATORY_CARE_PROVIDER_SITE_OTHER): Payer: Self-pay | Admitting: Internal Medicine

## 2019-12-15 DIAGNOSIS — G8929 Other chronic pain: Secondary | ICD-10-CM

## 2019-12-23 ENCOUNTER — Telehealth: Payer: Self-pay | Admitting: Cardiology

## 2019-12-23 NOTE — Telephone Encounter (Signed)
New message  Caryl Pina from [place] is calling in to make Dr. Martinique and his nurse aware that patient has been approved by insurance for the Repatha One [employer] until [id number].

## 2019-12-23 NOTE — Telephone Encounter (Signed)
Will route to primary nurse- as well as pharmacy for [place].   Thank you!

## 2019-12-23 NOTE — Telephone Encounter (Signed)
For you -

## 2019-12-28 ENCOUNTER — Telehealth: Payer: Self-pay

## 2019-12-28 MED ORDER — REPATHA SURECLICK 140 MG/ML ~~LOC~~ SOAJ: 140.0000 mg | SUBCUTANEOUS | 11 refills | Status: DC

## 2019-12-28 NOTE — Telephone Encounter (Signed)
Called and lmomed the pt that they are approved for repatha, refill sent, pt instructed to call back if issues arise

## 2019-12-28 NOTE — Telephone Encounter (Signed)
LMOMED THE PT TO START REPATHA AND TO CALL BACK IF UNAFFORDABLE

## 2019-12-30 ENCOUNTER — Other Ambulatory Visit: Payer: Self-pay

## 2019-12-30 ENCOUNTER — Telehealth: Payer: Self-pay | Admitting: Pharmacist

## 2019-12-30 MED ORDER — ROSUVASTATIN CALCIUM 20 MG PO TABS: 20.0000 mg | ORAL_TABLET | Freq: Every day | ORAL | 0 refills | Status: DC

## 2019-12-30 NOTE — Telephone Encounter (Signed)
Patient will like to talk to [facility] to correct some information provided yesterday 4/8. Please call him back.

## 2019-12-31 NOTE — Progress Notes (Signed)
Subjective:   George Bradley is seen for follow up CAD.   He had coronary artery bypass grafting in 2000. Cardiac catheterization was in 2007 and showed patent saphenous vein grafts x3 with a patent left internal mammary artery and well-preserved LV function. His EF is 55%. His last nuclear stress test in January 2018 showed a small fixed apical defect with normal ejection fraction. No ischemia. Echo in April showed normal EF and mild AS. Due to progressive symptoms cardiac cath was repeated in July 2018 and all grafts were still patent with no changed since 2007.   He also has a history of orthostatic hypotension and has persistent lightheadedness. He does note that increase salt intake helps. He has been on no antihypertensive therapy. He's had a history of what sounds like a Maze procedure when he lived in [place]. His other problems include hyperlipidemia, insomnia, oral cancer, and a history of depression. 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 He has had esophageal dilitation several times in the past. He is followed by oncology with no recurrent tongue CA noted. 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   On n follow up today he is doing well. He states he is not as active as he once was. He plays golf and does chores around the house and yard.  No chest pain or SOB. Energy level isn't as good as it once was.   Current Outpatient Medications  Medication Sig Dispense Refill  . Artificial Saliva (XEROSTOMIA RELIEF SPRAY MT) Xerostomia Relief mucosal spray  SPRAY 1 DOSE (2 SPRAYS) INTO THE MOUTH 3-4 TIMES PER DAY OR AS NEEDED    . aspirin EC 81 MG tablet Take 1 tablet (81 mg total) by mouth at bedtime.    . busPIRone (BUSPAR) 5 MG tablet TK 1 T PO BID PRN    . celecoxib (CELEBREX) 100 MG capsule Take 100 mg by mouth 2 (two) times daily.     . cetirizine (ZYRTEC) 10 MG tablet Take 10 mg by mouth daily.    . Cyanocobalamin (VITAMIN B-12) 5000 MCG TBDP Take by mouth daily.    .  Evolocumab (REPATHA SURECLICK) [id number] MG/ML SOAJ Inject 140 mg into the skin every 14 (fourteen) days. 6 pen 3  . fludrocortisone (FLORINEF) 0.1 MG tablet Take 0.1 mg by mouth daily.    . fluticasone (FLONASE) 50 MCG/ACT nasal spray Place 2 sprays into both nostrils at bedtime.     . gabapentin (NEURONTIN) 300 MG capsule Take 1capsule by mouth three times a day. 270 capsule 2  . levothyroxine (SYNTHROID, LEVOTHROID) 100 MCG tablet TAKE ONE TABLET BY MOUTH ONCE DAILY BEFORE  BREAKFAST (Patient taking differently: Take 100 mcg by mouth daily before breakfast. ) 90 tablet 0  . LORazepam (ATIVAN) 1 MG tablet TAKE 1 TABLET BY MOUTH THREE TIMES DAILY 90 tablet 0  . Multiple Vitamin (MULTIVITAMIN WITH MINERALS) TABS tablet Take 1 tablet by mouth daily.    . pantoprazole (PROTONIX) 40 MG tablet TAKE 1 TABLET BY MOUTH EVERY DAY BEFORE BREAKFAST (Patient taking differently: Take 40 mg by mouth daily as needed (acid reflux). ) 30 tablet 11  . pilocarpine (SALAGEN) 7.5 MG tablet TAKE 1 TABLET BY MOUTH THREE TIMES DAILY 270 tablet 3  . polyethylene glycol (MIRALAX / GLYCOLAX) packet Take 17 g by mouth daily as needed for mild constipation.     . rosuvastatin (CRESTOR) 20 MG tablet Take 1 tablet (20 mg total) by mouth daily. Please schedule annual  appt with Dr. Martinique for refills. [phone]. 1st attempt 30 tablet 0  . Sodium Fluoride (CLINPRO 5000) 1.1 % PSTE Clinpro 5000 1.1 % dental paste  BRUSH TEETH ONCE A DAY FOR 2 MINUTES WITH PEA-SIZED AMOUNT OF TOOTHPASTE. AGES 16+ SHOULD NOT RINSE    . traMADol (ULTRAM) 50 MG tablet Take 50 mg by mouth every 6 (six) hours as needed.    . TRAZODONE HCL PO Take 50 mg by mouth daily.    . Vitamins/Minerals TABS Take by mouth.    . zolpidem (AMBIEN) 10 MG tablet Take 10 mg by mouth at bedtime as needed for sleep. Reported on 03/26/2016     No current facility-administered medications for this visit.    Allergies  Allergen Reactions  . Zetia [Ezetimibe] Other (See  Comments)    "made me feel bad"    Patient Active Problem List   Diagnosis Date Noted  . 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 Dizziness 08/18/2017  . 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 Constipation 03/23/2014  . Esophageal dysphagia 03/23/2014  . 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 Xerostomia 06/10/2013  . Thrombocytopenia ([facility]) 06/10/2013  . S/P radiation therapy   . 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 Orthostatic hypotension 05/11/2012  . Epigastric pain 05/11/2012  . History of tongue cancer 11/17/2011  . Depression 10/01/2011  . Renal artery stenosis, native, bilateral ([place]) 09/03/2011  . 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 Angina pectoris ([facility]) 02/20/2011  . Hypercholesterolemia 02/20/2011  . Aortic valve stenosis, mild 02/20/2011    Social History   Tobacco Use  Smoking Status Former Smoker  . Years: 2.00  . Types: Cigars  . Quit date: 09/21/2009  . Years since quitting: 10.2  Smokeless Tobacco Former Systems developer  Tobacco Comment   2 cigars a week    Social History   Substance and Sexual Activity  Alcohol Use Yes  . Alcohol/week: 0.0 standard drinks   Comment: rare  wine    Family History  Problem Relation Age of Onset  . Heart disease Father   . Heart disease Brother   . Heart disease Sister   . Cancer Sister        breast cancer  . Cancer Mother        breast ca  . Dementia Mother   . Hyperlipidemia Son     Review of Systems:   As noted in history of present illness.  All other systems were reviewed and are negative.   Physical Exam:    BP (!) 144/72   Pulse (!) 50   Temp (!) 97.5 F (36.4 C)   Resp 20   Ht 6\' 1"  (1.854 m)   Wt 171 lb 9.6 oz (77.8 kg)   SpO2 96%   BMI 22.64 kg/m    GENERAL:  Well appearing WM in NAD HEENT:  PERRL, EOMI, sclera are clear. Oropharynx is clear. NECK:  No jugular venous distention, carotid upstroke brisk and symmetric, no bruits, no  thyromegaly or adenopathy LUNGS:  Clear to auscultation bilaterally CHEST:  Unremarkable HEART:  RRR,  PMI not displaced or sustained,S1 and S2 within normal limits, no S3, no S4: no clicks, no rubs, 2/6 high pitched systolic murmur RUSB radiating to carotids and apex.  ABD:  Soft, nontender. BS +, no masses or bruits. No hepatomegaly, no splenomegaly EXT:  2 + pulses throughout, no edema, no cyanosis no clubbing SKIN:  Warm and dry.  No rashes NEURO:  Alert  and oriented x 3. Cranial nerves II through XII intact. PSYCH:  Cognitively intact   Laboratory data:  Lab Results  Component Value Date   WBC 4.4 04/11/2019   HGB 13.1 04/11/2019   HCT 39.5 04/11/2019   PLT 119 (L) 04/11/2019   GLUCOSE 99 04/11/2019   CHOL 91 (L) 05/20/2018   TRIG 67 05/20/2018   HDL 55 05/20/2018   LDLCALC 23 05/20/2018   ALT 16 04/11/2019   AST 21 04/11/2019   NA 142 04/11/2019   K 4.2 04/11/2019   CL 106 04/11/2019   CREATININE 1.30 (H) 04/11/2019   BUN 17 04/11/2019   CO2 26 04/11/2019   TSH 2.220 08/21/2017   INR 1.0 04/02/2017     Labs from Dr. Virgina Jock dated 07/21/16: BUN 24, creatinine 1.5. Other CMET normal.  Dated 01/15/16: cholesterol 185, triglycerides 95, LDL 122, HDL 44. Dated 01/26/18: cholesterol 172, triglycerides 79, HDL 42, LDL 114. Creatinine 1.4. Other chemistries, TSH, CBC normal. Dated 08/13/18: cholesterol 109, triglycerides 51, HDL 61, LDL 38. Creatinine 1.3. ALT and TSH normal. Dated 01/31/19: cholesterol 100, triglycerides 58, HDL 52, LDL 36.   Ecg today shows NSR rate 50. First degree AV block. RBBB. LVH.  No change. I have personally reviewed and interpreted this study.   Echo dated 02/03/17: Study Conclusions  - Left ventricle: The cavity size was normal. Wall thickness was   increased in a pattern of moderate LVH. Systolic function was   normal. The estimated ejection fraction was in the range of 55%   to 60%. Wall motion was normal; there were no regional wall   motion  abnormalities. Doppler parameters are consistent with   abnormal left ventricular relaxation (grade 1 diastolic   dysfunction). - Aortic valve: Valve mobility was restricted. There was mild   stenosis. There was mild regurgitation. Valve area (VTI): 1.53   cm^2. Valve area (Vmax): 1.47 cm^2. Valve area (Vmean): 1.48   cm^2.  Impressions:  - Normal LV systolic function; moderate LVH; mild diastolic   dysfunction; calcified aortic valve with mild AS (mean gradient   13 mmHg); mild AI; mild TR.  Myoview 10/03/16: Study Highlights     The left ventricular ejection fraction is mildly decreased (45-54%).  Nuclear stress EF: 53%.  There was no ST segment deviation noted during stress.  Findings consistent with prior myocardial infarction.  This is a low risk study.   Small distal anterior wall / apical infarct no ischemia EF 53% with apical hypokinesis No ischemia Baseline ECG with RBBB   Procedures   Left Heart Cath and Cors/Grafts Angiography  Conclusion     Prox RCA lesion, 90 %stenosed.  Mid RCA lesion, 100 %stenosed.  Ost Cx to Prox Cx lesion, 70 %stenosed.  Prox Cx to Mid Cx lesion, 100 %stenosed.  Ost LAD lesion, 70 %stenosed.  Prox LAD to Mid LAD lesion, 100 %stenosed.  SVG graft was visualized by angiography and is normal in caliber and anatomically normal.  SVG graft was visualized by angiography and is normal in caliber and anatomically normal.  SVG graft was visualized by angiography and is normal in caliber and anatomically normal.  LIMA graft was visualized by angiography and is normal in caliber and anatomically normal.   1. Severe 3 vessel occlusive CAD 2. Patent LIMA to the LAD 3. Patent SVG to the first diagonal 4. Patent SVG to the OM 2 5. Patent SVG to the distal RCA  Plan: there is excellent patency of all his bypass  grafts. Unchanged from 2007. Continue medical treatment and consider other causes of his symptoms.        Assessment / Plan:  1.   Coronary disease status post CABG. Negative Myoview in January 2018. Cardiac cath in July 2018 showed patent grafts.  Continue risk factor modification. On ASA and statin/Repatha.   2. 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 Reported by oncology to be cancer free.   3. Hyperlipidemia. On crestor 20 mg.  Intolerant of Zetia. Now on Repatha  with excellent response. Follow up labs soon with Dr Virgina Jock.   6. History of RAS - now normotensive.  7. Esophageal disease post radiation. Still requiring periodic dilation.   8. Bifascicular block- chronic and asymptomatic.   9. Orthostatic hypotension. Chronic. Continue liberal salt intake, elevation of head of bed and support hose.

## 2020-01-02 MED ORDER — REPATHA SURECLICK 140 MG/ML ~~LOC~~ SOAJ: 140.0000 mg | SUBCUTANEOUS | 3 refills | Status: DC

## 2020-01-02 NOTE — Addendum Note (Signed)
Addended by: Allean Found on: 01/02/2020 11:16 AM   Modules accepted: Orders

## 2020-01-02 NOTE — Telephone Encounter (Signed)
Pt called in requesting a refill and sent to their pharmacy

## 2020-01-02 NOTE — Telephone Encounter (Signed)
lmomed the pt to please call me back so that we can correct any issue that he may have

## 2020-01-04 ENCOUNTER — Encounter: Payer: Self-pay | Admitting: Cardiology

## 2020-01-04 ENCOUNTER — Ambulatory Visit (INDEPENDENT_AMBULATORY_CARE_PROVIDER_SITE_OTHER): Payer: Medicare Other | Admitting: Cardiology

## 2020-01-04 VITALS — BP 144/72 | HR 50 | Temp 97.5°F | Resp 20 | Ht 73.0 in | Wt 171.6 lb

## 2020-01-04 DIAGNOSIS — E7800 Pure hypercholesterolemia, unspecified: Secondary | ICD-10-CM

## 2020-01-04 DIAGNOSIS — I350 Nonrheumatic aortic (valve) stenosis: Secondary | ICD-10-CM | POA: Diagnosis not present

## 2020-01-04 DIAGNOSIS — I25118 Atherosclerotic heart disease of native coronary artery with other forms of angina pectoris: Secondary | ICD-10-CM

## 2020-01-04 DIAGNOSIS — I951 Orthostatic hypotension: Secondary | ICD-10-CM | POA: Diagnosis not present

## 2020-01-04 MED ORDER — ROSUVASTATIN CALCIUM 20 MG PO TABS: 20.0000 mg | ORAL_TABLET | Freq: Every day | ORAL | 3 refills | Status: DC

## 2020-01-09 DIAGNOSIS — L910 Hypertrophic scar: Secondary | ICD-10-CM | POA: Diagnosis not present

## 2020-01-09 DIAGNOSIS — L570 Actinic keratosis: Secondary | ICD-10-CM | POA: Diagnosis not present

## 2020-01-09 DIAGNOSIS — Z85828 Personal history of other malignant neoplasm of skin: Secondary | ICD-10-CM | POA: Diagnosis not present

## 2020-01-10 DIAGNOSIS — M5416 Radiculopathy, lumbar region: Secondary | ICD-10-CM | POA: Diagnosis not present

## 2020-01-14 ENCOUNTER — Other Ambulatory Visit (INDEPENDENT_AMBULATORY_CARE_PROVIDER_SITE_OTHER): Payer: Self-pay | Admitting: Internal Medicine

## 2020-01-14 DIAGNOSIS — R1013 Epigastric pain: Secondary | ICD-10-CM

## 2020-01-14 DIAGNOSIS — G8929 Other chronic pain: Secondary | ICD-10-CM

## 2020-01-16 NOTE — Telephone Encounter (Signed)
Per Dr.[name] the patient needs OV in June.

## 2020-01-17 ENCOUNTER — Other Ambulatory Visit (INDEPENDENT_AMBULATORY_CARE_PROVIDER_SITE_OTHER): Payer: Self-pay | Admitting: Internal Medicine

## 2020-01-17 DIAGNOSIS — G8929 Other chronic pain: Secondary | ICD-10-CM

## 2020-01-20 DIAGNOSIS — M545 Low back pain: Secondary | ICD-10-CM | POA: Diagnosis not present

## 2020-02-02 DIAGNOSIS — Z125 Encounter for screening for malignant neoplasm of prostate: Secondary | ICD-10-CM | POA: Diagnosis not present

## 2020-02-02 DIAGNOSIS — E7849 Other hyperlipidemia: Secondary | ICD-10-CM | POA: Diagnosis not present

## 2020-02-02 DIAGNOSIS — E038 Other specified hypothyroidism: Secondary | ICD-10-CM | POA: Diagnosis not present

## 2020-02-06 DIAGNOSIS — H4911 Fourth [trochlear] nerve palsy, right eye: Secondary | ICD-10-CM | POA: Diagnosis not present

## 2020-02-09 DIAGNOSIS — Z1331 Encounter for screening for depression: Secondary | ICD-10-CM | POA: Diagnosis not present

## 2020-03-05 ENCOUNTER — Other Ambulatory Visit: Payer: Self-pay | Admitting: Hematology and Oncology

## 2020-03-05 ENCOUNTER — Telehealth: Payer: Self-pay

## 2020-03-05 MED ORDER — GABAPENTIN 300 MG PO CAPS: 600.0000 mg | ORAL_CAPSULE | Freq: Three times a day (TID) | ORAL | 2 refills | Status: DC

## 2020-03-05 NOTE — Telephone Encounter (Signed)
He called and left a message asking for a call back.  Called back. He is requesting to increase Gabapentin to 2 tabs 3 x a day, for a total of 6 a day. He is requesting the increase to help with nerve pain. Ask that Rx be sent to [facility] in [place] on [address] for a 90 day supply.

## 2020-03-05 NOTE — Telephone Encounter (Signed)
Called and given below message. He verbalized understanding. 

## 2020-03-05 NOTE — Telephone Encounter (Signed)
That's a lot of pills I sent it His insurance may or may not approve it
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
## 2020-03-15 ENCOUNTER — Ambulatory Visit (INDEPENDENT_AMBULATORY_CARE_PROVIDER_SITE_OTHER): Payer: Medicare Other | Admitting: Gastroenterology

## 2020-03-15 ENCOUNTER — Telehealth (INDEPENDENT_AMBULATORY_CARE_PROVIDER_SITE_OTHER): Payer: Self-pay | Admitting: *Deleted

## 2020-03-15 ENCOUNTER — Other Ambulatory Visit: Payer: Self-pay

## 2020-03-15 ENCOUNTER — Other Ambulatory Visit (INDEPENDENT_AMBULATORY_CARE_PROVIDER_SITE_OTHER): Payer: Self-pay | Admitting: Internal Medicine

## 2020-03-15 ENCOUNTER — Encounter (INDEPENDENT_AMBULATORY_CARE_PROVIDER_SITE_OTHER): Payer: Self-pay | Admitting: Gastroenterology

## 2020-03-15 VITALS — BP 159/83 | HR 56 | Temp 97.9°F | Ht 73.0 in | Wt 165.0 lb

## 2020-03-15 DIAGNOSIS — G8929 Other chronic pain: Secondary | ICD-10-CM

## 2020-03-15 DIAGNOSIS — R1013 Epigastric pain: Secondary | ICD-10-CM

## 2020-03-15 MED ORDER — AMITRIPTYLINE HCL 25 MG PO TABS: 25.0000 mg | ORAL_TABLET | Freq: Every day | ORAL | 3 refills | Status: DC

## 2020-03-15 NOTE — Telephone Encounter (Signed)
Discussed all symptoms via phone with patient around 10:50am. All questions answered and treatment plan reviewed via phone. Apologized for delay of care due to my children's illness and arranging childcare.

## 2020-03-15 NOTE — Telephone Encounter (Signed)
Patient had appt today at 930, he arrive sin office about 910 or so. He was told Thayer Headings was running behind with an emergency and was on her way. He came to my window about 1030 fussing because he had been waiting for over an hour. I tried to explain Thayer Headings was on her was and she had an emergency. He stated he was told that an hour ago and he was leaving.

## 2020-03-22 ENCOUNTER — Encounter (HOSPITAL_COMMUNITY): Payer: Self-pay

## 2020-03-22 ENCOUNTER — Emergency Department (HOSPITAL_COMMUNITY): Payer: Medicare Other

## 2020-03-22 ENCOUNTER — Emergency Department (HOSPITAL_COMMUNITY)
Admission: EM | Admit: 2020-03-22 | Discharge: 2020-03-23 | Disposition: A | Discharge status: Home / Self Care | Payer: Medicare Other | Attending: Emergency Medicine | Admitting: Emergency Medicine

## 2020-03-22 ENCOUNTER — Other Ambulatory Visit: Payer: Self-pay

## 2020-03-22 DIAGNOSIS — R109 Unspecified abdominal pain: Secondary | ICD-10-CM | POA: Diagnosis not present

## 2020-03-22 DIAGNOSIS — E890 Postprocedural hypothyroidism: Secondary | ICD-10-CM | POA: Insufficient documentation

## 2020-03-22 DIAGNOSIS — K6389 Other specified diseases of intestine: Secondary | ICD-10-CM | POA: Diagnosis not present

## 2020-03-22 DIAGNOSIS — R1011 Right upper quadrant pain: Secondary | ICD-10-CM | POA: Diagnosis present

## 2020-03-22 DIAGNOSIS — Z7982 Long term (current) use of aspirin: Secondary | ICD-10-CM | POA: Insufficient documentation

## 2020-03-22 DIAGNOSIS — Z87891 Personal history of nicotine dependence: Secondary | ICD-10-CM | POA: Insufficient documentation

## 2020-03-22 DIAGNOSIS — I2510 Atherosclerotic heart disease of native coronary artery without angina pectoris: Secondary | ICD-10-CM | POA: Diagnosis not present

## 2020-03-22 DIAGNOSIS — Z85810 Personal history of malignant neoplasm of tongue: Secondary | ICD-10-CM | POA: Diagnosis not present

## 2020-03-22 DIAGNOSIS — Z951 Presence of aortocoronary bypass graft: Secondary | ICD-10-CM | POA: Insufficient documentation

## 2020-03-22 DIAGNOSIS — R1084 Generalized abdominal pain: Secondary | ICD-10-CM | POA: Diagnosis not present

## 2020-03-22 DIAGNOSIS — R110 Nausea: Secondary | ICD-10-CM | POA: Diagnosis not present

## 2020-03-22 DIAGNOSIS — R9431 Abnormal electrocardiogram [ECG] [EKG]: Secondary | ICD-10-CM | POA: Diagnosis not present

## 2020-03-22 LAB — CBC WITH DIFFERENTIAL/PLATELET
Abs Immature Granulocytes: 0.02 10*3/uL (ref 0.00–0.07)
Basophils Absolute: 0 10*3/uL (ref 0.0–0.1)
Basophils Relative: 0 %
Eosinophils Absolute: 0.2 10*3/uL (ref 0.0–0.5)
Eosinophils Relative: 2 %
HCT: 44.8 % (ref 39.0–52.0)
Hemoglobin: 14.7 g/dL (ref 13.0–17.0)
Immature Granulocytes: 0 %
Lymphocytes Relative: 12 %
Lymphs Abs: 1.3 10*3/uL (ref 0.7–4.0)
MCH: 32.7 pg (ref 26.0–34.0)
MCHC: 32.8 g/dL (ref 30.0–36.0)
MCV: 99.8 fL (ref 80.0–100.0)
Monocytes Absolute: 0.6 10*3/uL (ref 0.1–1.0)
Monocytes Relative: 6 %
Neutro Abs: 8.8 10*3/uL — ABNORMAL HIGH (ref 1.7–7.7)
Neutrophils Relative %: 80 %
Platelets: 142 10*3/uL — ABNORMAL LOW (ref 150–400)
RBC: 4.49 MIL/uL (ref 4.22–5.81)
RDW: 14.3 % (ref 11.5–15.5)
WBC: 11 10*3/uL — ABNORMAL HIGH (ref 4.0–10.5)
nRBC: 0 % (ref 0.0–0.2)

## 2020-03-22 LAB — COMPREHENSIVE METABOLIC PANEL
ALT: 22 U/L (ref 0–44)
AST: 25 U/L (ref 15–41)
Albumin: 4.4 g/dL (ref 3.5–5.0)
Alkaline Phosphatase: 36 U/L — ABNORMAL LOW (ref 38–126)
Anion gap: 9 (ref 5–15)
BUN: 22 mg/dL (ref 8–23)
CO2: 29 mmol/L (ref 22–32)
Calcium: 9.2 mg/dL (ref 8.9–10.3)
Chloride: 100 mmol/L (ref 98–111)
Creatinine, Ser: 1.06 mg/dL (ref 0.61–1.24)
GFR calc Af Amer: >60 mL/min (ref >60)
GFR calc non Af Amer: >60 mL/min (ref >60)
Glucose, Bld: 103 mg/dL — ABNORMAL HIGH (ref 70–99)
Potassium: 3.9 mmol/L (ref 3.5–5.1)
Sodium: 138 mmol/L (ref 135–145)
Total Bilirubin: 0.7 mg/dL (ref 0.3–1.2)
Total Protein: 7.9 g/dL (ref 6.5–8.1)

## 2020-03-22 MED ORDER — SODIUM CHLORIDE 0.9 % IV BOLUS
1000.0000 mL | Freq: Once | INTRAVENOUS | Status: AC
  Administered 2020-03-22: 1000 mL via INTRAVENOUS

## 2020-03-22 MED ORDER — FENTANYL CITRATE (PF) 100 MCG/2ML IJ SOLN
50.0000 ug | Freq: Once | INTRAMUSCULAR | Status: AC
  Administered 2020-03-22: 50 ug via INTRAVENOUS
  Filled 2020-03-22: qty 2

## 2020-03-22 MED ORDER — IOHEXOL 300 MG/ML  SOLN
100.0000 mL | Freq: Once | INTRAMUSCULAR | Status: AC | PRN
  Administered 2020-03-22: 100 mL via INTRAVENOUS

## 2020-03-22 NOTE — ED Notes (Signed)
Pt to xr 

## 2020-03-22 NOTE — Discharge Instructions (Signed)
Follow up with your Physician for recheck  

## 2020-03-22 NOTE — ED Notes (Signed)
Pt Daughter Wash Nienhaus leaving, asked to be called for updates, or if needing to be picked up.  H: [phone] C: [phone]
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
## 2020-03-22 NOTE — ED Triage Notes (Signed)
Pt presents to ED with complaints of abdominal pain since this am. Pt denies N/V/D

## 2020-03-27 ENCOUNTER — Telehealth (INDEPENDENT_AMBULATORY_CARE_PROVIDER_SITE_OTHER): Payer: Self-pay | Admitting: Gastroenterology

## 2020-03-27 NOTE — Telephone Encounter (Signed)
Called patient as promised at televisit for follow-up report of how he is doing on the amitriptyline. Pt mowing grass but wife reports he is doing remarkably well.  He did try to decrease his gabapentin dose but ultimately this led to severe abdominal pain and was seen in ER with negative CT.  He resumed his normal gabapentin dose and continue the amitriptyline 25mg  and is feeling the best he has felt in a long while.  He would like to call our office for follow-up as needed.

## 2020-03-29 ENCOUNTER — Telehealth: Payer: Self-pay

## 2020-03-29 NOTE — Telephone Encounter (Signed)
Called and given below message. He verbalized understanding. Appt moved to 1220 pm on 7/27. He is aware of time.

## 2020-03-29 NOTE — Telephone Encounter (Signed)
-----   Message from Heath Lark, MD sent at 03/29/2020 12:14 PM EDT ----- Regarding: can he come in later in the day for his appt on 7/27? morning is overbooked

## 2020-04-10 ENCOUNTER — Inpatient Hospital Stay: Payer: Medicare Other

## 2020-04-11 ENCOUNTER — Other Ambulatory Visit: Payer: Self-pay

## 2020-04-11 ENCOUNTER — Inpatient Hospital Stay: Payer: Medicare Other | Attending: Hematology and Oncology

## 2020-04-11 DIAGNOSIS — D696 Thrombocytopenia, unspecified: Secondary | ICD-10-CM | POA: Insufficient documentation

## 2020-04-11 DIAGNOSIS — G620 Drug-induced polyneuropathy: Secondary | ICD-10-CM | POA: Insufficient documentation

## 2020-04-11 DIAGNOSIS — D472 Monoclonal gammopathy: Secondary | ICD-10-CM | POA: Insufficient documentation

## 2020-04-11 LAB — COMPREHENSIVE METABOLIC PANEL
ALT: 12 U/L (ref 0–44)
AST: 19 U/L (ref 15–41)
Albumin: 3.9 g/dL (ref 3.5–5.0)
Alkaline Phosphatase: 37 U/L — ABNORMAL LOW (ref 38–126)
Anion gap: 8 (ref 5–15)
BUN: 21 mg/dL (ref 8–23)
CO2: 27 mmol/L (ref 22–32)
Calcium: 9.7 mg/dL (ref 8.9–10.3)
Chloride: 105 mmol/L (ref 98–111)
Creatinine, Ser: 1.21 mg/dL (ref 0.61–1.24)
GFR calc Af Amer: >60 mL/min (ref >60)
GFR calc non Af Amer: 57 mL/min — ABNORMAL LOW (ref >60)
Glucose, Bld: 102 mg/dL — ABNORMAL HIGH (ref 70–99)
Potassium: 4.4 mmol/L (ref 3.5–5.1)
Sodium: 140 mmol/L (ref 135–145)
Total Bilirubin: 0.3 mg/dL (ref 0.3–1.2)
Total Protein: 7.4 g/dL (ref 6.5–8.1)

## 2020-04-11 LAB — CBC WITH DIFFERENTIAL/PLATELET
Abs Immature Granulocytes: 0 10*3/uL (ref 0.00–0.07)
Basophils Absolute: 0 10*3/uL (ref 0.0–0.1)
Basophils Relative: 0 %
Eosinophils Absolute: 0.2 10*3/uL (ref 0.0–0.5)
Eosinophils Relative: 3 %
HCT: 39 % (ref 39.0–52.0)
Hemoglobin: 12.9 g/dL — ABNORMAL LOW (ref 13.0–17.0)
Immature Granulocytes: 0 %
Lymphocytes Relative: 32 %
Lymphs Abs: 1.6 10*3/uL (ref 0.7–4.0)
MCH: 32.6 pg (ref 26.0–34.0)
MCHC: 33.1 g/dL (ref 30.0–36.0)
MCV: 98.5 fL (ref 80.0–100.0)
Monocytes Absolute: 0.5 10*3/uL (ref 0.1–1.0)
Monocytes Relative: 11 %
Neutro Abs: 2.6 10*3/uL (ref 1.7–7.7)
Neutrophils Relative %: 54 %
Platelets: 138 10*3/uL — ABNORMAL LOW (ref 150–400)
RBC: 3.96 MIL/uL — ABNORMAL LOW (ref 4.22–5.81)
RDW: 13.7 % (ref 11.5–15.5)
WBC: 4.9 10*3/uL (ref 4.0–10.5)
nRBC: 0 % (ref 0.0–0.2)

## 2020-04-12 LAB — KAPPA/LAMBDA LIGHT CHAINS
Kappa free light chain: 25.2 mg/L — ABNORMAL HIGH (ref 3.3–19.4)
Kappa, lambda light chain ratio: 0.93 (ref 0.26–1.65)
Lambda free light chains: 27.2 mg/L — ABNORMAL HIGH (ref 5.7–26.3)

## 2020-04-15 ENCOUNTER — Other Ambulatory Visit (INDEPENDENT_AMBULATORY_CARE_PROVIDER_SITE_OTHER): Payer: Self-pay | Admitting: Gastroenterology

## 2020-04-15 DIAGNOSIS — G8929 Other chronic pain: Secondary | ICD-10-CM

## 2020-04-16 LAB — MULTIPLE MYELOMA PANEL, SERUM
Albumin SerPl Elph-Mcnc: 3.8 g/dL (ref 2.9–4.4)
Albumin/Glob SerPl: 1.3 (ref 0.7–1.7)
Alpha 1: 0.3 g/dL (ref 0.0–0.4)
Alpha2 Glob SerPl Elph-Mcnc: 0.8 g/dL (ref 0.4–1.0)
B-Globulin SerPl Elph-Mcnc: 0.8 g/dL (ref 0.7–1.3)
Gamma Glob SerPl Elph-Mcnc: 1.3 g/dL (ref 0.4–1.8)
Globulin, Total: 3.1 g/dL (ref 2.2–3.9)
IgA: 111 mg/dL (ref 61–437)
IgG (Immunoglobin G), Serum: 1214 mg/dL (ref 603–1613)
IgM (Immunoglobulin M), Srm: 60 mg/dL (ref 15–143)
M Protein SerPl Elph-Mcnc: 0.8 g/dL — ABNORMAL HIGH
Total Protein ELP: 6.9 g/dL (ref 6.0–8.5)

## 2020-04-17 ENCOUNTER — Other Ambulatory Visit: Payer: Self-pay

## 2020-04-17 ENCOUNTER — Ambulatory Visit: Payer: Medicare Other | Admitting: Hematology and Oncology

## 2020-04-17 ENCOUNTER — Encounter: Payer: Self-pay | Admitting: Hematology and Oncology

## 2020-04-17 ENCOUNTER — Telehealth: Payer: Self-pay | Admitting: Hematology and Oncology

## 2020-04-17 ENCOUNTER — Inpatient Hospital Stay (HOSPITAL_BASED_OUTPATIENT_CLINIC_OR_DEPARTMENT_OTHER): Payer: Medicare Other | Admitting: Hematology and Oncology

## 2020-04-17 VITALS — BP 153/76 | HR 62 | Temp 97.9°F | Resp 18 | Ht 73.0 in | Wt 170.6 lb

## 2020-04-17 DIAGNOSIS — G620 Drug-induced polyneuropathy: Secondary | ICD-10-CM | POA: Diagnosis not present

## 2020-04-17 DIAGNOSIS — D472 Monoclonal gammopathy: Secondary | ICD-10-CM

## 2020-04-17 DIAGNOSIS — Z85810 Personal history of malignant neoplasm of tongue: Secondary | ICD-10-CM

## 2020-04-17 DIAGNOSIS — T451X5A Adverse effect of antineoplastic and immunosuppressive drugs, initial encounter: Secondary | ICD-10-CM | POA: Diagnosis not present

## 2020-04-17 DIAGNOSIS — I25118 Atherosclerotic heart disease of native coronary artery with other forms of angina pectoris: Secondary | ICD-10-CM

## 2020-04-17 DIAGNOSIS — D696 Thrombocytopenia, unspecified: Secondary | ICD-10-CM

## 2020-04-17 MED ORDER — GABAPENTIN 300 MG PO CAPS: 900.0000 mg | ORAL_CAPSULE | Freq: Three times a day (TID) | ORAL | 2 refills | Status: DC

## 2020-04-17 MED ORDER — HYDROMORPHONE HCL 4 MG PO TABS: 4.0000 mg | ORAL_TABLET | Freq: Four times a day (QID) | ORAL | 0 refills | Status: DC | PRN

## 2020-04-17 NOTE — Telephone Encounter (Signed)
Scheduled appts per 7/27 sch msg. Gave pt a print out of appt calendar.

## 2020-04-17 NOTE — Assessment & Plan Note (Signed)
He has multifactorial pain.  His abdominal pain has been extensively evaluated and there is a neuropathic component to it related to prior feeding tube placement I am increasing gabapentin to 900 mg 3 times a day which he claimed control is better He denies excessive side effects from that  He also have severe lower extremity pain secondary to severe nerve root compression on his back I recommend a trial of hydromorphone I warned him, all the medication he is taking right now could cause risk of excessive sedation and constipation The patient is willing to accept these risk I will call him next week to assess pain control I will see him back in the month for further follow-up

## 2020-04-17 NOTE — Assessment & Plan Note (Signed)
The cause could be related to his underlying medical condition. It is mild and there is little change compared from previous platelet count. The patient denies recent history of bleeding such as epistaxis, hematuria or hematochezia. He is asymptomatic from the thrombocytopenia. I will observe for now.

## 2020-04-17 NOTE — Progress Notes (Signed)
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 It is mild and there is little change compared from previous platelet count. The patient denies recent history of bleeding such as epistaxis, hematuria or hematochezia. He is asymptomatic from the thrombocytopenia. I will observe for now.    No orders of the defined types were placed in this encounter.   All questions were answered. The patient knows to call the clinic with any problems, questions or concerns. The total time spent in the appointment was 25 minutes encounter with patients including review of chart and various tests results, discussions about plan of care  and coordination of care plan   Heath Lark, MD 04/17/2020 4:20 PM  INTERVAL HISTORY: Please see below for problem oriented charting. He returns for his yearly MGUS follow-up and history of tongue cancer He went to the emergency department recently with severe abdominal pain and underwent CT imaging which came back unremarkable He self increase gabapentin to 900 mg 3 times a day and felt that it helped He denies excessive sedation or constipation He has also severe neuropathic pain starting on his back radiating down to both legs He had extensive evaluation with orthopedic surgery He had injection to his back that only helped for about 2 months He would like to try something stronger than tramadol for pain management  SUMMARY OF ONCOLOGIC HISTORY:  The patient was diagnosed with tongue cancer around July of 2012 at the presentation with the lump. PET/CT scan showed no evidence of distant metastatic disease however he does have evidence of lymphadenopathy. He was offered concurrent chemoradiation therapy with weekly cisplatin between August to October 2012. From 2012 to 2014 he had serial imaging studies which show no evidence of disease recurrence. 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  He is being observed  REVIEW OF SYSTEMS:   Constitutional: Denies fevers, chills or abnormal weight loss Eyes: Denies blurriness of vision Ears, nose, mouth, throat, and face: Denies mucositis or sore throat Respiratory: Denies cough, dyspnea or wheezes Cardiovascular: Denies palpitation, chest discomfort or lower extremity swelling Gastrointestinal:  Denies nausea, heartburn or change in bowel habits Skin: Denies abnormal skin rashes Lymphatics: Denies new lymphadenopathy or easy  bruising Behavioral/Psych: Mood is stable, no new changes  All other systems were reviewed with the patient and are negative.  I have reviewed the past  medical history, past surgical history, social history and family history with the patient and they are unchanged from previous note.  ALLERGIES:  is allergic to zetia [ezetimibe].  MEDICATIONS:  Current Outpatient Medications  Medication Sig Dispense Refill   LORazepam (ATIVAN) 1 MG tablet TAKE 1 TABLET BY MOUTH THREE TIMES DAILY 90 tablet 1   traZODone (DESYREL) 50 MG tablet Take 50 mg by mouth at bedtime.     amitriptyline (ELAVIL) 25 MG tablet Take 1 tablet (25 mg total) by mouth at bedtime. 30 tablet 3   busPIRone (BUSPAR) 5 MG tablet Take 5 mg by mouth 2 (two) times daily.      cetirizine (ZYRTEC) 10 MG tablet Take 10 mg by mouth daily.     donepezil (ARICEPT) 5 MG tablet Take 5 mg by mouth at bedtime.     fludrocortisone (FLORINEF) 0.1 MG tablet Take 0.1 mg by mouth daily.     gabapentin (NEURONTIN) 300 MG capsule Take 3 capsules (900 mg total) by mouth 3 (three) times daily. 540 capsule 2   indomethacin (INDOCIN) 25 MG capsule Take 25 mg by mouth in the morning and at bedtime.     levothyroxine (SYNTHROID, LEVOTHROID) 100 MCG tablet TAKE ONE TABLET BY MOUTH ONCE DAILY BEFORE  BREAKFAST (Patient taking differently: Take 100 mcg by mouth daily before breakfast. ) 90 tablet 0   pilocarpine (SALAGEN) 7.5 MG tablet TAKE 1 TABLET BY MOUTH THREE TIMES DAILY (Patient taking differently: Take 7.5 mg by mouth 3 (three) times daily. ) 270 tablet 3   rosuvastatin (CRESTOR) 20 MG tablet Take 1 tablet (20 mg total) by mouth daily. Please schedule annual appt with Dr. Martinique for refills. [phone]. 1st attempt (Patient taking differently: Take 20 mg by mouth daily. ) 90 tablet 3   traMADol (ULTRAM) 50 MG tablet Take 50 mg by mouth 2 (two) times daily as needed for moderate pain.      No current facility-administered medications for this visit.    PHYSICAL EXAMINATION: ECOG PERFORMANCE STATUS: 1 - Symptomatic but completely ambulatory  Vitals:   04/17/20 1241  BP: (!) 153/76   Pulse: 62  Resp: 18  Temp: 97.9 F (36.6 C)  SpO2: 100%   Filed Weights   04/17/20 1241  Weight: 170 lb 9.6 oz (77.4 kg)    GENERAL:alert, no distress and comfortable NEURO: alert & oriented x 3 with fluent speech, no focal motor/sensory deficits  LABORATORY DATA:  I have reviewed the data as listed    Component Value Date/Time   NA 140 04/11/2020 1415   NA 140 08/21/2017 0952   K 4.4 04/11/2020 1415   K 5.1 08/21/2017 0952   CL 105 04/11/2020 1415   CL 106 11/25/2012 0812   CO2 27 04/11/2020 1415   CO2 25 08/21/2017 0952   GLUCOSE 102 (H) 04/11/2020 1415   GLUCOSE 92 08/21/2017 0952   GLUCOSE 102 (H) 11/25/2012 0812   BUN 21 04/11/2020 1415   BUN 33.9 (H) 08/21/2017 0952   CREATININE 1.21 04/11/2020 1415   CREATININE 1.30 (H) 04/11/2019 0740   CREATININE 1.6 (H) 08/21/2017 0952   CALCIUM 9.7 04/11/2020 1415   CALCIUM 10.0 08/21/2017 0952   PROT 7.4 04/11/2020 1415   PROT 7.3 05/20/2018 0820   PROT 8.5 (H) 08/21/2017 0952   ALBUMIN 3.9 04/11/2020 1415   ALBUMIN  4.6 05/20/2018 0820   ALBUMIN 4.2 08/21/2017 0952   AST 19 04/11/2020 1415   AST 21 04/11/2019 0740   AST 23 08/21/2017 0952   ALT 12 04/11/2020 1415   ALT 16 04/11/2019 0740   ALT 19 08/21/2017 0952   ALKPHOS 37 (L) 04/11/2020 1415   ALKPHOS 40 08/21/2017 0952   BILITOT 0.3 04/11/2020 1415   BILITOT 0.3 04/11/2019 0740   BILITOT 0.26 08/21/2017 0952   GFRNONAA 57 (L) 04/11/2020 1415   GFRNONAA 52 (L) 04/11/2019 0740   GFRAA >60 04/11/2020 1415   GFRAA >60 04/11/2019 0740    No results found for: SPEP, UPEP  Lab Results  Component Value Date   WBC 4.9 04/11/2020   NEUTROABS 2.6 04/11/2020   HGB 12.9 (L) 04/11/2020   HCT 39.0 04/11/2020   MCV 98.5 04/11/2020   PLT 138 (L) 04/11/2020      Chemistry      Component Value Date/Time   NA 140 04/11/2020 1415   NA 140 08/21/2017 0952   K 4.4 04/11/2020 1415   K 5.1 08/21/2017 0952   CL 105 04/11/2020 1415   CL 106 11/25/2012 0812   CO2  27 04/11/2020 1415   CO2 25 08/21/2017 0952   BUN 21 04/11/2020 1415   BUN 33.9 (H) 08/21/2017 0952   CREATININE 1.21 04/11/2020 1415   CREATININE 1.30 (H) 04/11/2019 0740   CREATININE 1.6 (H) 08/21/2017 0952      Component Value Date/Time   CALCIUM 9.7 04/11/2020 1415   CALCIUM 10.0 08/21/2017 0952   ALKPHOS 37 (L) 04/11/2020 1415   ALKPHOS 40 08/21/2017 0952   AST 19 04/11/2020 1415   AST 21 04/11/2019 0740   AST 23 08/21/2017 0952   ALT 12 04/11/2020 1415   ALT 16 04/11/2019 0740   ALT 19 08/21/2017 0952   BILITOT 0.3 04/11/2020 1415   BILITOT 0.3 04/11/2019 0740   BILITOT 0.26 08/21/2017 0952       RADIOGRAPHIC STUDIES: I have personally reviewed the radiological images as listed and agreed with the findings in the report. CT ABDOMEN PELVIS W CONTRAST  Result Date: 03/22/2020 CLINICAL DATA:  Abdominal pain EXAM: CT ABDOMEN AND PELVIS WITH CONTRAST TECHNIQUE: Multidetector CT imaging of the abdomen and pelvis was performed using the standard protocol following bolus administration of intravenous contrast. CONTRAST:  183mL OMNIPAQUE IOHEXOL 300 MG/ML  SOLN COMPARISON:  08/21/2017 FINDINGS: Lower chest: Coronary artery calcifications. Patchy airspace opacity at the right lung base could reflect early infiltrate. No effusions. Hepatobiliary: No focal hepatic abnormality. Gallbladder unremarkable. Pancreas: No focal abnormality or ductal dilatation. Spleen: No focal abnormality.  Normal size. Adrenals/Urinary Tract: No adrenal abnormality. No focal renal abnormality. No stones or hydronephrosis. Urinary bladder is unremarkable. Stomach/Bowel: Normal appendix. Moderate stool burden throughout the colon. No bowel obstructs crash that no evidence of bowel obstruction. Vascular/Lymphatic: Heavily calcified aorta and iliac vessels. No adenopathy Reproductive: No visible focal abnormality. Other: No free fluid or free air. Musculoskeletal: No acute bony abnormality. Degenerative changes in the  lumbar spine. IMPRESSION: Patchy airspace opacity at the right lung base could reflect early infiltrate/pneumonia or atelectasis. Moderate stool burden throughout the colon. No acute findings in the abdomen or pelvis. Aortic atherosclerosis.  Coronary artery disease. Electronically Signed   By: Rolm Baptise M.D.   On: 03/22/2020 23:09   DG ABD ACUTE 2+V W 1V CHEST  Result Date: 03/22/2020 CLINICAL DATA:  Abdomen pain EXAM: DG ABDOMEN ACUTE W/ 1V CHEST COMPARISON:  06/16/2011, chest  x-ray 04/02/2017 FINDINGS: Single view chest demonstrates post sternotomy changes. No consolidation or effusion. Normal cardiomediastinal silhouette with aortic atherosclerosis. No pneumothorax. Supine and upright views of the abdomen demonstrate a nonobstructed gas pattern with moderate stool in the colon. High-riding colon in the right upper quadrant beneath the diaphragm. Scattered radiopacities suspected to represent intraluminal contents within the colon. Probable phleboliths in the left hemipelvis. IMPRESSION: 1. No radiographic evidence for acute cardiopulmonary abnormality. 2. Nonobstructed gas pattern with moderate stool in the colon. Electronically Signed   By: Donavan Foil M.D.   On: 03/22/2020 21:08

## 2020-04-17 NOTE — Assessment & Plan Note (Addendum)
Clinically, he has no signs of end organ damage His myeloma panel is stable He had chronic kidney disease, stable, unrelated For that, I recommend once a year visit We discussed the natural history of MGUS

## 2020-04-21 DIAGNOSIS — R457 State of emotional shock and stress, unspecified: Secondary | ICD-10-CM | POA: Diagnosis not present

## 2020-04-21 DIAGNOSIS — R42 Dizziness and giddiness: Secondary | ICD-10-CM | POA: Diagnosis not present

## 2020-04-21 DIAGNOSIS — R52 Pain, unspecified: Secondary | ICD-10-CM | POA: Diagnosis not present

## 2020-04-21 DIAGNOSIS — R531 Weakness: Secondary | ICD-10-CM | POA: Diagnosis not present

## 2020-04-21 DIAGNOSIS — I959 Hypotension, unspecified: Secondary | ICD-10-CM | POA: Diagnosis not present

## 2020-04-23 ENCOUNTER — Telehealth: Payer: Self-pay

## 2020-04-23 ENCOUNTER — Other Ambulatory Visit: Payer: Self-pay | Admitting: Hematology and Oncology

## 2020-04-23 NOTE — Telephone Encounter (Signed)
-----   Message from Heath Lark, MD sent at 04/23/2020 10:04 AM EDT ----- Regarding: can you call and ask how is his pain doing?

## 2020-04-23 NOTE — Telephone Encounter (Signed)
How many pills of dilaudid per day? I only gave him 30 pills Remind him to call for refills mid week

## 2020-04-23 NOTE — Telephone Encounter (Signed)
Called and ask him to call the office back. Left below message.

## 2020-04-23 NOTE — Telephone Encounter (Signed)
He called back and left a message. PCP got him a neurologist appt finally. Thanks for the offer to get the appt.  New medication is working great and he is not having any pain.

## 2020-04-23 NOTE — Telephone Encounter (Signed)
He called and left a message. He is taking a 1/2 tab in am and 1/2 tab in the pm. He is fine to take it that way. He received my earlier message and verbalized understanding to the message about the refill policy. He appreciated the call.

## 2020-05-01 ENCOUNTER — Telehealth: Payer: Self-pay

## 2020-05-01 NOTE — Telephone Encounter (Signed)
Can you go ahead and delete dilaudid off

## 2020-05-01 NOTE — Telephone Encounter (Signed)
He called and left a message. The pain medication that he was given for the severe pain, he has stopped because his pain is better and is manageable. He had every side effect that he was told that could possibly happen. He said, thank you for the pain Rx. He is very [employer].

## 2020-05-02 ENCOUNTER — Other Ambulatory Visit: Payer: Self-pay | Admitting: Cardiology

## 2020-05-09 ENCOUNTER — Encounter: Payer: Self-pay | Admitting: Internal Medicine

## 2020-05-09 DIAGNOSIS — E039 Hypothyroidism, unspecified: Secondary | ICD-10-CM | POA: Diagnosis not present

## 2020-05-09 DIAGNOSIS — R42 Dizziness and giddiness: Secondary | ICD-10-CM | POA: Diagnosis not present

## 2020-05-09 DIAGNOSIS — D696 Thrombocytopenia, unspecified: Secondary | ICD-10-CM | POA: Diagnosis not present

## 2020-05-09 DIAGNOSIS — I2510 Atherosclerotic heart disease of native coronary artery without angina pectoris: Secondary | ICD-10-CM | POA: Diagnosis not present

## 2020-05-11 ENCOUNTER — Telehealth: Payer: Self-pay | Admitting: Hematology and Oncology

## 2020-05-11 NOTE — Telephone Encounter (Signed)
Left message for patient with one year f/u 2022

## 2020-05-21 ENCOUNTER — Ambulatory Visit: Payer: Medicare Other | Admitting: Hematology and Oncology

## 2020-06-13 ENCOUNTER — Other Ambulatory Visit: Payer: Self-pay

## 2020-06-13 MED ORDER — REPATHA SURECLICK 140 MG/ML ~~LOC~~ SOAJ: 140.0000 mg | SUBCUTANEOUS | 11 refills | Status: DC

## 2020-06-19 ENCOUNTER — Encounter: Payer: Self-pay | Admitting: *Deleted

## 2020-06-19 DIAGNOSIS — G3184 Mild cognitive impairment, so stated: Secondary | ICD-10-CM | POA: Diagnosis not present

## 2020-06-20 ENCOUNTER — Other Ambulatory Visit (INDEPENDENT_AMBULATORY_CARE_PROVIDER_SITE_OTHER): Payer: Self-pay | Admitting: Gastroenterology

## 2020-06-20 DIAGNOSIS — G8929 Other chronic pain: Secondary | ICD-10-CM

## 2020-06-20 NOTE — Telephone Encounter (Signed)
Please advise 

## 2020-06-21 ENCOUNTER — Emergency Department (HOSPITAL_COMMUNITY): Payer: Medicare Other

## 2020-06-21 ENCOUNTER — Emergency Department (HOSPITAL_COMMUNITY)
Admission: EM | Admit: 2020-06-21 | Discharge: 2020-06-21 | Disposition: A | Discharge status: Home / Self Care | Payer: Medicare Other | Attending: Emergency Medicine | Admitting: Emergency Medicine

## 2020-06-21 ENCOUNTER — Encounter (HOSPITAL_COMMUNITY): Payer: Self-pay | Admitting: *Deleted

## 2020-06-21 ENCOUNTER — Other Ambulatory Visit: Payer: Self-pay

## 2020-06-21 ENCOUNTER — Ambulatory Visit: Payer: Medicare Other | Admitting: Neurology

## 2020-06-21 DIAGNOSIS — Z85810 Personal history of malignant neoplasm of tongue: Secondary | ICD-10-CM | POA: Diagnosis not present

## 2020-06-21 DIAGNOSIS — R0902 Hypoxemia: Secondary | ICD-10-CM | POA: Diagnosis not present

## 2020-06-21 DIAGNOSIS — I2510 Atherosclerotic heart disease of native coronary artery without angina pectoris: Secondary | ICD-10-CM | POA: Diagnosis not present

## 2020-06-21 DIAGNOSIS — R0602 Shortness of breath: Secondary | ICD-10-CM | POA: Diagnosis not present

## 2020-06-21 DIAGNOSIS — E039 Hypothyroidism, unspecified: Secondary | ICD-10-CM | POA: Diagnosis not present

## 2020-06-21 DIAGNOSIS — E860 Dehydration: Secondary | ICD-10-CM | POA: Diagnosis not present

## 2020-06-21 DIAGNOSIS — I959 Hypotension, unspecified: Secondary | ICD-10-CM | POA: Insufficient documentation

## 2020-06-21 DIAGNOSIS — R531 Weakness: Secondary | ICD-10-CM | POA: Diagnosis not present

## 2020-06-21 DIAGNOSIS — Z79899 Other long term (current) drug therapy: Secondary | ICD-10-CM | POA: Diagnosis not present

## 2020-06-21 DIAGNOSIS — Z20822 Contact with and (suspected) exposure to covid-19: Secondary | ICD-10-CM | POA: Diagnosis not present

## 2020-06-21 DIAGNOSIS — Z87891 Personal history of nicotine dependence: Secondary | ICD-10-CM | POA: Insufficient documentation

## 2020-06-21 DIAGNOSIS — W19XXXA Unspecified fall, initial encounter: Secondary | ICD-10-CM | POA: Diagnosis not present

## 2020-06-21 DIAGNOSIS — I4510 Unspecified right bundle-branch block: Secondary | ICD-10-CM | POA: Diagnosis not present

## 2020-06-21 DIAGNOSIS — R0689 Other abnormalities of breathing: Secondary | ICD-10-CM | POA: Diagnosis not present

## 2020-06-21 LAB — CBC WITH DIFFERENTIAL/PLATELET
Abs Immature Granulocytes: 0.02 10*3/uL (ref 0.00–0.07)
Basophils Absolute: 0 10*3/uL (ref 0.0–0.1)
Basophils Relative: 0 %
Eosinophils Absolute: 0.2 10*3/uL (ref 0.0–0.5)
Eosinophils Relative: 2 %
HCT: 42.4 % (ref 39.0–52.0)
Hemoglobin: 13.4 g/dL (ref 13.0–17.0)
Immature Granulocytes: 0 %
Lymphocytes Relative: 20 %
Lymphs Abs: 1.9 10*3/uL (ref 0.7–4.0)
MCH: 31.5 pg (ref 26.0–34.0)
MCHC: 31.6 g/dL (ref 30.0–36.0)
MCV: 99.8 fL (ref 80.0–100.0)
Monocytes Absolute: 0.9 10*3/uL (ref 0.1–1.0)
Monocytes Relative: 10 %
Neutro Abs: 6.3 10*3/uL (ref 1.7–7.7)
Neutrophils Relative %: 68 %
Platelets: 135 10*3/uL — ABNORMAL LOW (ref 150–400)
RBC: 4.25 MIL/uL (ref 4.22–5.81)
RDW: 13.6 % (ref 11.5–15.5)
WBC: 9.3 10*3/uL (ref 4.0–10.5)
nRBC: 0 % (ref 0.0–0.2)

## 2020-06-21 LAB — COMPREHENSIVE METABOLIC PANEL
ALT: 15 U/L (ref 0–44)
AST: 27 U/L (ref 15–41)
Albumin: 4 g/dL (ref 3.5–5.0)
Alkaline Phosphatase: 33 U/L — ABNORMAL LOW (ref 38–126)
Anion gap: 10 (ref 5–15)
BUN: 28 mg/dL — ABNORMAL HIGH (ref 8–23)
CO2: 27 mmol/L (ref 22–32)
Calcium: 9.1 mg/dL (ref 8.9–10.3)
Chloride: 103 mmol/L (ref 98–111)
Creatinine, Ser: 1.64 mg/dL — ABNORMAL HIGH (ref 0.61–1.24)
GFR calc Af Amer: 45 mL/min — ABNORMAL LOW (ref >60)
GFR calc non Af Amer: 39 mL/min — ABNORMAL LOW (ref >60)
Glucose, Bld: 102 mg/dL — ABNORMAL HIGH (ref 70–99)
Potassium: 4.6 mmol/L (ref 3.5–5.1)
Sodium: 140 mmol/L (ref 135–145)
Total Bilirubin: 0.7 mg/dL (ref 0.3–1.2)
Total Protein: 7.1 g/dL (ref 6.5–8.1)

## 2020-06-21 LAB — LIPASE, BLOOD: Lipase: 27 U/L (ref 11–51)

## 2020-06-21 LAB — URINALYSIS, ROUTINE W REFLEX MICROSCOPIC
Bilirubin Urine: NEGATIVE
Glucose, UA: NEGATIVE mg/dL
Hgb urine dipstick: NEGATIVE
Ketones, ur: NEGATIVE mg/dL
Leukocytes,Ua: NEGATIVE
Nitrite: NEGATIVE
Protein, ur: NEGATIVE mg/dL
Specific Gravity, Urine: 1.006 (ref 1.005–1.030)
pH: 6 (ref 5.0–8.0)

## 2020-06-21 LAB — RESPIRATORY PANEL BY RT PCR (FLU A&B, COVID)
Influenza A by PCR: NEGATIVE
Influenza B by PCR: NEGATIVE
SARS Coronavirus 2 by RT PCR: NEGATIVE

## 2020-06-21 LAB — LACTIC ACID, PLASMA: Lactic Acid, Venous: 1.2 mmol/L (ref 0.5–1.9)

## 2020-06-21 LAB — TROPONIN I (HIGH SENSITIVITY)
Troponin I (High Sensitivity): 22 ng/L — ABNORMAL HIGH (ref <18)
Troponin I (High Sensitivity): 20 ng/L — ABNORMAL HIGH (ref <18)

## 2020-06-21 LAB — BRAIN NATRIURETIC PEPTIDE: B Natriuretic Peptide: 68.6 pg/mL (ref 0.0–100.0)

## 2020-06-21 MED ORDER — SODIUM CHLORIDE 0.9 % IV BOLUS
1000.0000 mL | Freq: Once | INTRAVENOUS | Status: AC
  Administered 2020-06-21: 1000 mL via INTRAVENOUS

## 2020-06-21 NOTE — ED Triage Notes (Signed)
Pt arrived by [facility]. Pt was found by wife siting on bathroom floor. Only injury was elbow abrasion but pt was hypotensive on ems arrival, bp 76/45. Pt reported mild sob and fatigue. Received 562ml bolus pta.

## 2020-06-21 NOTE — ED Notes (Signed)
Family at bedside. 

## 2020-06-21 NOTE — Discharge Instructions (Signed)
Please schedule a follow-up appointment with your primary doctor to be seen either tomorrow or early next week.  Stay hydrated by drinking plenty of fluids.  If you have any additional falls or other concerning episodes, symptoms like chest pain or difficulty in breathing, passing out, please return to ER for reassessment.  Please have your primary doctor recheck your blood pressure as well as recheck your kidney function levels.
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
## 2020-06-22 DIAGNOSIS — N1831 Chronic kidney disease, stage 3a: Secondary | ICD-10-CM | POA: Diagnosis not present

## 2020-06-22 DIAGNOSIS — I951 Orthostatic hypotension: Secondary | ICD-10-CM | POA: Diagnosis not present

## 2020-06-22 DIAGNOSIS — W19XXXA Unspecified fall, initial encounter: Secondary | ICD-10-CM | POA: Diagnosis not present

## 2020-06-22 DIAGNOSIS — R413 Other amnesia: Secondary | ICD-10-CM | POA: Diagnosis not present

## 2020-06-22 DIAGNOSIS — R682 Dry mouth, unspecified: Secondary | ICD-10-CM | POA: Diagnosis not present

## 2020-06-22 DIAGNOSIS — R001 Bradycardia, unspecified: Secondary | ICD-10-CM | POA: Diagnosis not present

## 2020-06-22 DIAGNOSIS — E039 Hypothyroidism, unspecified: Secondary | ICD-10-CM | POA: Diagnosis not present

## 2020-06-22 DIAGNOSIS — I2510 Atherosclerotic heart disease of native coronary artery without angina pectoris: Secondary | ICD-10-CM | POA: Diagnosis not present
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
## 2020-06-27 ENCOUNTER — Other Ambulatory Visit: Payer: Self-pay | Admitting: Nurse Practitioner

## 2020-06-27 ENCOUNTER — Ambulatory Visit (INDEPENDENT_AMBULATORY_CARE_PROVIDER_SITE_OTHER): Payer: Medicare Other

## 2020-06-27 ENCOUNTER — Ambulatory Visit (INDEPENDENT_AMBULATORY_CARE_PROVIDER_SITE_OTHER): Payer: Medicare Other | Admitting: Nurse Practitioner

## 2020-06-27 ENCOUNTER — Other Ambulatory Visit: Payer: Self-pay

## 2020-06-27 ENCOUNTER — Encounter: Payer: Self-pay | Admitting: Nurse Practitioner

## 2020-06-27 VITALS — BP 129/67 | HR 58 | Ht 73.0 in | Wt 177.6 lb

## 2020-06-27 DIAGNOSIS — R42 Dizziness and giddiness: Secondary | ICD-10-CM | POA: Diagnosis not present

## 2020-06-27 DIAGNOSIS — I440 Atrioventricular block, first degree: Secondary | ICD-10-CM | POA: Diagnosis not present

## 2020-06-27 DIAGNOSIS — I350 Nonrheumatic aortic (valve) stenosis: Secondary | ICD-10-CM

## 2020-06-27 DIAGNOSIS — I951 Orthostatic hypotension: Secondary | ICD-10-CM

## 2020-06-27 DIAGNOSIS — Z23 Encounter for immunization: Secondary | ICD-10-CM | POA: Diagnosis not present

## 2020-06-27 DIAGNOSIS — I701 Atherosclerosis of renal artery: Secondary | ICD-10-CM | POA: Diagnosis not present

## 2020-06-27 DIAGNOSIS — I25118 Atherosclerotic heart disease of native coronary artery with other forms of angina pectoris: Secondary | ICD-10-CM

## 2020-06-27 DIAGNOSIS — R55 Syncope and collapse: Secondary | ICD-10-CM | POA: Diagnosis not present

## 2020-06-27 DIAGNOSIS — I452 Bifascicular block: Secondary | ICD-10-CM

## 2020-06-27 DIAGNOSIS — I493 Ventricular premature depolarization: Secondary | ICD-10-CM

## 2020-06-27 DIAGNOSIS — I259 Chronic ischemic heart disease, unspecified: Secondary | ICD-10-CM

## 2020-06-27 NOTE — Patient Instructions (Addendum)
After Visit Summary:  We will be checking the following labs today - NONE   Medication Instructions:    Continue with your current medicines for now.    If you need a refill on your cardiac medications before your next appointment, please call your pharmacy.     Testing/Procedures To Be Arranged:  Echocardiogram  Event monitor  Follow-Up:   See Dr. Martinique as planned next month    At [facility], you and your health needs are our priority.  As part of our continuing mission to provide you with exceptional heart care, we have created designated Provider Care Teams.  These Care Teams include your primary Cardiologist (physician) and Advanced Practice Providers (APPs -  Physician Assistants and Nurse Practitioners) who all work together to provide you with the care you need, when you need it.  Special Instructions:  . Stay safe, wash your hands for at least 20 seconds and wear a mask when needed.  . It was good to talk with you today.  . Get back to using salt! V8 juice each morning . Compression stockings (medium)- put on in the mornings and take off at night.    Call the [place] office at [phone] if you have any questions, problems or concerns.

## 2020-06-29 ENCOUNTER — Encounter (HOSPITAL_COMMUNITY): Payer: Medicare Other

## 2020-07-02 DIAGNOSIS — G3184 Mild cognitive impairment, so stated: Secondary | ICD-10-CM | POA: Diagnosis not present

## 2020-07-02 DIAGNOSIS — Z712 Person consulting for explanation of examination or test findings: Secondary | ICD-10-CM | POA: Diagnosis not present

## 2020-07-13 ENCOUNTER — Other Ambulatory Visit (INDEPENDENT_AMBULATORY_CARE_PROVIDER_SITE_OTHER): Payer: Self-pay | Admitting: Gastroenterology

## 2020-07-13 ENCOUNTER — Ambulatory Visit (HOSPITAL_COMMUNITY): Payer: Medicare Other | Attending: Cardiology

## 2020-07-13 ENCOUNTER — Other Ambulatory Visit: Payer: Self-pay

## 2020-07-13 DIAGNOSIS — I951 Orthostatic hypotension: Secondary | ICD-10-CM | POA: Diagnosis not present

## 2020-07-13 DIAGNOSIS — I259 Chronic ischemic heart disease, unspecified: Secondary | ICD-10-CM | POA: Insufficient documentation

## 2020-07-13 DIAGNOSIS — I701 Atherosclerosis of renal artery: Secondary | ICD-10-CM | POA: Insufficient documentation

## 2020-07-13 DIAGNOSIS — I350 Nonrheumatic aortic (valve) stenosis: Secondary | ICD-10-CM

## 2020-07-13 DIAGNOSIS — I25118 Atherosclerotic heart disease of native coronary artery with other forms of angina pectoris: Secondary | ICD-10-CM | POA: Diagnosis not present

## 2020-07-13 LAB — ECHOCARDIOGRAM COMPLETE
AR max vel: 1.67 cm2
AV Area VTI: 1.89 cm2
AV Area mean vel: 1.77 cm2
AV Mean grad: 18 mmHg
AV Peak grad: 34.8 mmHg
Ao pk vel: 2.95 m/s
Area-P 1/2: 1.78 cm2
P 1/2 time: 491 msec
S' Lateral: 3.35 cm

## 2020-07-23 ENCOUNTER — Other Ambulatory Visit: Payer: Self-pay

## 2020-07-23 ENCOUNTER — Ambulatory Visit (HOSPITAL_COMMUNITY)
Admission: RE | Admit: 2020-07-23 | Discharge: 2020-07-23 | Disposition: A | Discharge status: Home / Self Care | Payer: Medicare Other | Source: Ambulatory Visit | Attending: Cardiology | Admitting: Cardiology

## 2020-07-23 DIAGNOSIS — I350 Nonrheumatic aortic (valve) stenosis: Secondary | ICD-10-CM | POA: Diagnosis not present

## 2020-07-23 DIAGNOSIS — I259 Chronic ischemic heart disease, unspecified: Secondary | ICD-10-CM | POA: Insufficient documentation

## 2020-07-23 DIAGNOSIS — I25118 Atherosclerotic heart disease of native coronary artery with other forms of angina pectoris: Secondary | ICD-10-CM | POA: Diagnosis not present

## 2020-07-23 DIAGNOSIS — I701 Atherosclerosis of renal artery: Secondary | ICD-10-CM | POA: Insufficient documentation

## 2020-07-23 DIAGNOSIS — I951 Orthostatic hypotension: Secondary | ICD-10-CM | POA: Diagnosis not present

## 2020-07-25 ENCOUNTER — Other Ambulatory Visit: Payer: Self-pay | Admitting: *Deleted

## 2020-07-25 DIAGNOSIS — K551 Chronic vascular disorders of intestine: Secondary | ICD-10-CM

## 2020-08-09 ENCOUNTER — Other Ambulatory Visit: Payer: Self-pay | Admitting: *Deleted

## 2020-08-09 ENCOUNTER — Encounter: Payer: Self-pay | Admitting: Vascular Surgery

## 2020-08-09 ENCOUNTER — Other Ambulatory Visit: Payer: Self-pay

## 2020-08-09 ENCOUNTER — Ambulatory Visit (INDEPENDENT_AMBULATORY_CARE_PROVIDER_SITE_OTHER): Payer: Medicare Other | Admitting: Vascular Surgery

## 2020-08-09 VITALS — BP 102/56 | HR 52 | Temp 97.9°F | Resp 20 | Ht 73.0 in | Wt 181.0 lb

## 2020-08-09 DIAGNOSIS — K55069 Acute infarction of intestine, part and extent unspecified: Secondary | ICD-10-CM

## 2020-08-09 DIAGNOSIS — K551 Chronic vascular disorders of intestine: Secondary | ICD-10-CM | POA: Diagnosis not present

## 2020-08-09 DIAGNOSIS — I259 Chronic ischemic heart disease, unspecified: Secondary | ICD-10-CM
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
## 2020-08-11 NOTE — Progress Notes (Signed)
Subjective:   [name] is seen for follow up CAD.   He had coronary artery bypass grafting in 2000. Cardiac catheterization was in 2007 and showed patent saphenous vein grafts x3 with a patent left internal mammary artery and well-preserved LV function. His EF is 55%. His last nuclear stress test in January 2018 showed a small fixed apical defect with normal ejection fraction. No ischemia. Echo in April showed normal EF and mild AS. Due to progressive symptoms cardiac cath was repeated in July 2018 and all grafts were still patent with no changed since 2007.   He also has a history of orthostatic hypotension and has persistent lightheadedness. He does note that increase salt intake helps. He has been on no antihypertensive therapy. He's had a history of what sounds like a Maze procedure when he lived in [place]. His other problems include hyperlipidemia, insomnia, oral cancer, and a history of depression. 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 He has had esophageal dilitation several times in the past. He is followed by oncology without recurrent tongue CA noted. 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   He was seen in October by Truitt Merle NP after experiencing a spell with possible fall. BP was labile. Echo showed mild to moderate AS and MR. EF was normal. Event monitor showed moderate PVCs with burden of 6%. Renal duplex showed mild right RAS. Incidental finding of severe SMA stenosis. He was seen in consultation with Dr Oneida Alar and CT angiogram of abdomen/pelvis is planned.   On  follow up today he is doing well. Marland Kitchen He plays golf and does chores around the house and yard. He denies any more falls. He states his lightheadedness is about the same as it always is. His swallowing is doing very well. Mouth stays dry.  No chest pain or SOB. Energy level isn't as good as it once was.   Current Outpatient Medications  Medication Sig Dispense Refill  . amitriptyline (ELAVIL) 25  MG tablet TAKE 1 TABLET(25 MG) BY MOUTH AT BEDTIME 30 tablet 3  . busPIRone (BUSPAR) 5 MG tablet Take 5 mg by mouth 2 (two) times daily as needed (anxiety).     Marland Kitchen donepezil (ARICEPT) 5 MG tablet Take 5 mg by mouth at bedtime.    . Evolocumab (REPATHA SURECLICK) 031 MG/ML SOAJ Inject 140 mg into the skin every 14 (fourteen) days. 2 mL 11  . fludrocortisone (FLORINEF) 0.1 MG tablet Take 0.1 mg by mouth daily.    . fluticasone (FLONASE) 50 MCG/ACT nasal spray Place 1 spray into both nostrils 2 (two) times daily.    Marland Kitchen gabapentin (NEURONTIN) 300 MG capsule Take 3 capsules (900 mg total) by mouth 3 (three) times daily. 540 capsule 2  . indomethacin (INDOCIN) 25 MG capsule Take 25 mg by mouth 2 (two) times daily with a meal.     . levothyroxine (SYNTHROID, LEVOTHROID) 100 MCG tablet TAKE ONE TABLET BY MOUTH ONCE DAILY BEFORE  BREAKFAST 90 tablet 0  . LORazepam (ATIVAN) 1 MG tablet TAKE 1 TABLET BY MOUTH THREE TIMES DAILY 90 tablet 1  . pilocarpine (SALAGEN) 7.5 MG tablet Take 7.5 mg by mouth in the morning and at bedtime.    . rosuvastatin (CRESTOR) 20 MG tablet TAKE 1 TABLET BY MOUTH DAILY 30 tablet 1  . traMADol (ULTRAM) 50 MG tablet Take 50 mg by mouth 2 (two) times daily as needed for moderate pain.      No current facility-administered medications for  this visit.    Allergies  Allergen Reactions  . Zetia [Ezetimibe] Other (See Comments)    "made me feel bad"    Patient Active Problem List   Diagnosis Date Noted  . 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 Dizziness 08/18/2017  . 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 Constipation 03/23/2014  . Esophageal dysphagia 03/23/2014  . 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 Xerostomia 06/10/2013  . Thrombocytopenia ([facility]) 06/10/2013  . S/P radiation therapy   . 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 Orthostatic hypotension 05/11/2012  . Epigastric pain 05/11/2012  . History of tongue cancer  11/17/2011  . Depression 10/01/2011  . Renal artery stenosis, native, bilateral ([facility]) 09/03/2011  . 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 Angina pectoris ([facility]) 02/20/2011  . Hypercholesterolemia 02/20/2011  . Aortic valve stenosis, mild 02/20/2011    Social History   Tobacco Use  Smoking Status Former Smoker  . Years: 2.00  . Types: Cigars  . Quit date: 09/21/2009  . Years since quitting: 10.9  Smokeless Tobacco Former Systems developer  Tobacco Comment   2 cigars a week    Social History   Substance and Sexual Activity  Alcohol Use Yes  . Alcohol/week: 0.0 standard drinks   Comment: rare  wine    Family History  Problem Relation Age of Onset  . Heart disease Father   . Heart disease Brother   . Heart disease Sister   . Cancer Sister        breast cancer  . Cancer Mother        breast ca  . Dementia Mother   . Hyperlipidemia Son     Review of Systems:   As noted in history of present illness.  All other systems were reviewed and are negative.   Physical Exam:    BP (!) 152/64   Pulse 65   Ht 6\' 1"  (1.854 m)   Wt 183 lb 12.8 oz (83.4 kg)   SpO2 97%   BMI 24.25 kg/m    GENERAL:  Well appearing WM in NAD HEENT:  PERRL, EOMI, sclera are clear. Oropharynx is clear. NECK:  No jugular venous distention, carotid upstroke brisk and symmetric, no bruits, no thyromegaly or adenopathy LUNGS:  Clear to auscultation bilaterally CHEST:  Unremarkable HEART:  RRR,  PMI not displaced or sustained,S1 and S2 within normal limits, no S3, no S4: no clicks, no rubs, harsh 2/6 high pitched systolic murmur RUSB radiating to carotids and apex.  ABD:  Soft, nontender. BS +, no masses or bruits. No hepatomegaly, no splenomegaly EXT:  2 + pulses throughout, no edema, no cyanosis no clubbing SKIN:  Warm and dry.  No rashes NEURO:  Alert and oriented x 3. Cranial nerves II through XII intact. PSYCH:  Cognitively intact   Laboratory data:  Lab Results  Component Value Date    WBC 9.3 06/21/2020   HGB 13.4 06/21/2020   HCT 42.4 06/21/2020   PLT 135 (L) 06/21/2020   GLUCOSE 102 (H) 06/21/2020   CHOL 91 (L) 05/20/2018   TRIG 67 05/20/2018   HDL 55 05/20/2018   LDLCALC 23 05/20/2018   ALT 15 06/21/2020   AST 27 06/21/2020   NA 140 06/21/2020   K 4.6 06/21/2020   CL 103 06/21/2020   CREATININE 1.64 (H) 06/21/2020   BUN 28 (H) 06/21/2020   CO2 27 06/21/2020   TSH 2.220 08/21/2017   INR 1.0 04/02/2017     Labs from Dr.  Russo dated 07/21/16: BUN 24, creatinine 1.5. Other CMET normal.  Dated 01/15/16: cholesterol 185, triglycerides 95, LDL 122, HDL 44. Dated 01/26/18: cholesterol 172, triglycerides 79, HDL 42, LDL 114. Creatinine 1.4. Other chemistries, TSH, CBC normal. Dated 08/13/18: cholesterol 109, triglycerides 51, HDL 61, LDL 38. Creatinine 1.3. ALT and TSH normal. Dated 01/31/19: cholesterol 100, triglycerides 58, HDL 52, LDL 36.  Dated 02/02/20: cholesterol 109, triglycerides 76, HDL 68, LDL 26.   Ecg not done today   Echo dated 02/03/17: Study Conclusions  - Left ventricle: The cavity size was normal. Wall thickness was   increased in a pattern of moderate LVH. Systolic function was   normal. The estimated ejection fraction was in the range of 55%   to 60%. Wall motion was normal; there were no regional wall   motion abnormalities. Doppler parameters are consistent with   abnormal left ventricular relaxation (grade 1 diastolic   dysfunction). - Aortic valve: Valve mobility was restricted. There was mild   stenosis. There was mild regurgitation. Valve area (VTI): 1.53   cm^2. Valve area (Vmax): 1.47 cm^2. Valve area (Vmean): 1.48   cm^2.  Impressions:  - Normal LV systolic function; moderate LVH; mild diastolic   dysfunction; calcified aortic valve with mild AS (mean gradient   13 mmHg); mild AI; mild TR.  Myoview 10/03/16: Study Highlights     The left ventricular ejection fraction is mildly decreased (45-54%).  Nuclear stress EF:  53%.  There was no ST segment deviation noted during stress.  Findings consistent with prior myocardial infarction.  This is a low risk study.   Small distal anterior wall / apical infarct no ischemia EF 53% with apical hypokinesis No ischemia Baseline ECG with RBBB   Procedures   Left Heart Cath and Cors/Grafts Angiography  Conclusion     Prox RCA lesion, 90 %stenosed.  Mid RCA lesion, 100 %stenosed.  Ost Cx to Prox Cx lesion, 70 %stenosed.  Prox Cx to Mid Cx lesion, 100 %stenosed.  Ost LAD lesion, 70 %stenosed.  Prox LAD to Mid LAD lesion, 100 %stenosed.  SVG graft was visualized by angiography and is normal in caliber and anatomically normal.  SVG graft was visualized by angiography and is normal in caliber and anatomically normal.  SVG graft was visualized by angiography and is normal in caliber and anatomically normal.  LIMA graft was visualized by angiography and is normal in caliber and anatomically normal.   1. Severe 3 vessel occlusive CAD 2. Patent LIMA to the LAD 3. Patent SVG to the first diagonal 4. Patent SVG to the OM 2 5. Patent SVG to the distal RCA  Plan: there is excellent patency of all his bypass grafts. Unchanged from 2007. Continue medical treatment and consider other causes of his symptoms.     Echo 07/13/20: IMPRESSIONS    1. Left ventricular ejection fraction, by estimation, is 50 to 55%. Left  ventricular ejection fraction by 3D volume is 50 %. The left ventricle has  low normal function. The left ventricle has no regional wall motion  abnormalities. Left ventricular  diastolic parameters are consistent with Grade I diastolic dysfunction  (impaired relaxation).  2. Right ventricular systolic function is normal. The right ventricular  size is normal. There is normal pulmonary artery systolic pressure.  3. Left atrial size was moderately dilated.  4. Right atrial size was mildly dilated.  5. The mitral valve is normal in  structure. Mild to moderate mitral valve  regurgitation. No evidence of mitral  stenosis.  6. The aortic valve is tricuspid. There is severe calcifcation of the  aortic valve. There is severe thickening of the aortic valve. Aortic valve  regurgitation is mild. Mild to moderate aortic valve stenosis. Aortic  valve area, by VTI measures 1.89 cm.  Aortic valve mean gradient measures 18.0 mmHg. Aortic valve Vmax measures  2.95 m/s.  7. Aortic dilatation noted. There is mild dilatation of the ascending  aorta, measuring 38 mm.  8. The inferior vena cava is normal in size with greater than 50%  respiratory variability, suggesting right atrial pressure of 3 mmHg.   Event monitor: 06/27/20: Study Highlights   Normal sinus rhythm  Occasional PVCs, multifocal with 4 beat runs of NSVT. Burden 6%  Rare PACs     Assessment / Plan:  1.   Coronary disease status post CABG. Negative Myoview in January 2018. Cardiac cath in July 2018 showed patent grafts.  Continue risk factor modification. On ASA and statin/Repatha.   2. 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 Reported by oncology to be cancer free.   3. Hyperlipidemia. On crestor 20 mg.  Intolerant of Zetia. Now on Repatha  with excellent response.   6. History of RAS - now normotensive. No significant RAS noted on duplex  7. Esophageal disease post radiation. Still requiring periodic dilation.   8. Bifascicular block- chronic and asymptomatic. No significant pauses or bradycardia noted on event monitor.   9. Orthostatic hypotension. Chronic. Continue liberal salt intake, elevation of head of bed and support hose. He monitors his BP religiously and it typically runs a little high. We should avoid any antihypertensive therapy to allow more margin for his orthostasis.   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 Plan CTA this Friday.   11. PVCs - not really symptomatic. Would avoid beta blockers due to hypotension.    I have recommended he stop taking Indocin regularly due to risk of bleeding and renal effects. May use PRN.   Follow up with me in 6 months.

## 2020-08-14 ENCOUNTER — Ambulatory Visit (INDEPENDENT_AMBULATORY_CARE_PROVIDER_SITE_OTHER): Payer: Medicare Other | Admitting: Cardiology

## 2020-08-14 ENCOUNTER — Encounter: Payer: Self-pay | Admitting: Cardiology

## 2020-08-14 ENCOUNTER — Other Ambulatory Visit: Payer: Self-pay

## 2020-08-14 VITALS — BP 152/64 | HR 65 | Ht 73.0 in | Wt 183.8 lb

## 2020-08-14 DIAGNOSIS — K551 Chronic vascular disorders of intestine: Secondary | ICD-10-CM

## 2020-08-14 DIAGNOSIS — E7800 Pure hypercholesterolemia, unspecified: Secondary | ICD-10-CM

## 2020-08-14 DIAGNOSIS — I701 Atherosclerosis of renal artery: Secondary | ICD-10-CM | POA: Diagnosis not present

## 2020-08-14 DIAGNOSIS — I350 Nonrheumatic aortic (valve) stenosis: Secondary | ICD-10-CM

## 2020-08-14 DIAGNOSIS — I25118 Atherosclerotic heart disease of native coronary artery with other forms of angina pectoris: Secondary | ICD-10-CM | POA: Diagnosis not present

## 2020-08-14 DIAGNOSIS — I259 Chronic ischemic heart disease, unspecified: Secondary | ICD-10-CM | POA: Diagnosis not present

## 2020-08-14 DIAGNOSIS — I951 Orthostatic hypotension: Secondary | ICD-10-CM | POA: Diagnosis not present

## 2020-08-14 DIAGNOSIS — I493 Ventricular premature depolarization: Secondary | ICD-10-CM

## 2020-08-14 MED ORDER — ASPIRIN EC 81 MG PO TBEC: 81.0000 mg | DELAYED_RELEASE_TABLET | Freq: Every day | ORAL | 3 refills | Status: DC

## 2020-08-15 LAB — BASIC METABOLIC PANEL
BUN/Creatinine Ratio: 15 (ref 10–24)
BUN: 19 mg/dL (ref 8–27)
CO2: 26 mmol/L (ref 20–29)
Calcium: 9.3 mg/dL (ref 8.6–10.2)
Chloride: 102 mmol/L (ref 96–106)
Creatinine, Ser: 1.28 mg/dL — ABNORMAL HIGH (ref 0.76–1.27)
GFR calc Af Amer: 61 mL/min/{1.73_m2} (ref >59)
GFR calc non Af Amer: 53 mL/min/{1.73_m2} — ABNORMAL LOW (ref >59)
Glucose: 84 mg/dL (ref 65–99)
Potassium: 4.4 mmol/L (ref 3.5–5.2)
Sodium: 141 mmol/L (ref 134–144)

## 2020-08-17 ENCOUNTER — Ambulatory Visit (HOSPITAL_COMMUNITY)
Admission: RE | Admit: 2020-08-17 | Discharge: 2020-08-17 | Disposition: A | Discharge status: Home / Self Care | Payer: Medicare Other | Source: Ambulatory Visit | Attending: Vascular Surgery | Admitting: Vascular Surgery

## 2020-08-17 ENCOUNTER — Emergency Department (HOSPITAL_COMMUNITY)
Admission: EM | Admit: 2020-08-17 | Discharge: 2020-08-17 | Discharge status: Absent without leave | Payer: Medicare Other

## 2020-08-17 ENCOUNTER — Encounter (HOSPITAL_COMMUNITY): Payer: Self-pay

## 2020-08-17 ENCOUNTER — Other Ambulatory Visit (INDEPENDENT_AMBULATORY_CARE_PROVIDER_SITE_OTHER): Payer: Self-pay | Admitting: Gastroenterology

## 2020-08-17 ENCOUNTER — Other Ambulatory Visit: Payer: Self-pay

## 2020-08-17 DIAGNOSIS — K55069 Acute infarction of intestine, part and extent unspecified: Secondary | ICD-10-CM | POA: Diagnosis not present

## 2020-08-17 DIAGNOSIS — G8929 Other chronic pain: Secondary | ICD-10-CM

## 2020-08-17 DIAGNOSIS — N281 Cyst of kidney, acquired: Secondary | ICD-10-CM | POA: Diagnosis not present

## 2020-08-17 DIAGNOSIS — Q600 Renal agenesis, unilateral: Secondary | ICD-10-CM | POA: Diagnosis not present

## 2020-08-17 DIAGNOSIS — I701 Atherosclerosis of renal artery: Secondary | ICD-10-CM | POA: Diagnosis not present

## 2020-08-17 DIAGNOSIS — I714 Abdominal aortic aneurysm, without rupture: Secondary | ICD-10-CM | POA: Diagnosis not present

## 2020-08-17 MED ORDER — IOHEXOL 350 MG/ML SOLN
100.0000 mL | Freq: Once | INTRAVENOUS | Status: AC | PRN
  Administered 2020-08-17: 100 mL via INTRAVENOUS

## 2020-08-21 DIAGNOSIS — N1831 Chronic kidney disease, stage 3a: Secondary | ICD-10-CM | POA: Diagnosis not present

## 2020-08-21 DIAGNOSIS — I517 Cardiomegaly: Secondary | ICD-10-CM | POA: Diagnosis not present

## 2020-08-21 DIAGNOSIS — E039 Hypothyroidism, unspecified: Secondary | ICD-10-CM | POA: Diagnosis not present

## 2020-08-21 DIAGNOSIS — R413 Other amnesia: Secondary | ICD-10-CM | POA: Diagnosis not present

## 2020-08-21 DIAGNOSIS — I700 Atherosclerosis of aorta: Secondary | ICD-10-CM | POA: Diagnosis not present

## 2020-08-21 DIAGNOSIS — I2510 Atherosclerotic heart disease of native coronary artery without angina pectoris: Secondary | ICD-10-CM | POA: Diagnosis not present

## 2020-08-21 DIAGNOSIS — I350 Nonrheumatic aortic (valve) stenosis: Secondary | ICD-10-CM | POA: Diagnosis not present

## 2020-08-21 DIAGNOSIS — E785 Hyperlipidemia, unspecified: Secondary | ICD-10-CM | POA: Diagnosis not present

## 2020-08-21 DIAGNOSIS — Z85810 Personal history of malignant neoplasm of tongue: Secondary | ICD-10-CM | POA: Diagnosis not present

## 2020-08-21 DIAGNOSIS — I951 Orthostatic hypotension: Secondary | ICD-10-CM | POA: Diagnosis not present

## 2020-08-21 DIAGNOSIS — D696 Thrombocytopenia, unspecified: Secondary | ICD-10-CM | POA: Diagnosis not present

## 2020-08-21 DIAGNOSIS — D892 Hypergammaglobulinemia, unspecified: Secondary | ICD-10-CM | POA: Diagnosis not present

## 2020-08-22 ENCOUNTER — Telehealth: Payer: Self-pay

## 2020-08-22 NOTE — Telephone Encounter (Signed)
Patient's wife called, PCP went over CT scan findings with them yesterday and would like to cancel appt with Dr. Oneida Alar if he was agreeable. MD has reviewed the scan and cancellation is appropriate. Patient aware.

## 2020-08-23 ENCOUNTER — Ambulatory Visit: Payer: Medicare Other | Admitting: Vascular Surgery

## 2020-08-29 DIAGNOSIS — M5459 Other low back pain: Secondary | ICD-10-CM | POA: Diagnosis not present

## 2020-09-13 ENCOUNTER — Other Ambulatory Visit: Payer: Self-pay | Admitting: Hematology and Oncology

## 2020-09-18 ENCOUNTER — Ambulatory Visit (INDEPENDENT_AMBULATORY_CARE_PROVIDER_SITE_OTHER): Payer: Medicare Other | Admitting: Internal Medicine

## 2020-09-26 ENCOUNTER — Telehealth (INDEPENDENT_AMBULATORY_CARE_PROVIDER_SITE_OTHER): Payer: Self-pay | Admitting: *Deleted

## 2020-09-26 ENCOUNTER — Telehealth: Payer: Self-pay | Admitting: Gastroenterology

## 2020-09-26 DIAGNOSIS — M545 Low back pain, unspecified: Secondary | ICD-10-CM | POA: Diagnosis not present

## 2020-09-26 NOTE — Telephone Encounter (Signed)
Patient was called and made aware of  Dr.[name]'s recommendation. Patient shared that he had called his PCP Dr.Russo this morning and that Dr.Russo had reached out to the [facility] and discussed patient's symptoms. They are going to see the patient. George Bradley is unsure if it is Gastroenterology or ENT. He is awaiting a call for an appointment.  George Bradley was made aware that Dr.[name] had stated that if he was having swallowing difficulty he would need a Esophageal Dilation unless patient felt that it was a throat problem he may need to be reevaluated by ENT Specialist. He shared in order for them to make the appointment for him to be seen there we will need to cancel his appointment in March 2022. Patient is changing his GI care to [facility]. At this point after his appointment patient will be followed by [facility] and not [facility].

## 2020-09-26 NOTE — Telephone Encounter (Signed)
Hi Dr.Danis, we have received an urgent referral from patient's PCP for a second opinion for esophageal abnormality. When I spoke with patient, he stated that he would like to transfer his GI care from [facility] over to [place] because he is no longer satisfied with the care that he has received there. He stated that there is some contradiction about abnormalities in his esophagus between his oncologist and his GI doctor. I will send you his PCP records that were sent with the referral. Patients GI records from [facility] are in Epic. Please advise on scheduling. Thank you.

## 2020-09-26 NOTE — Telephone Encounter (Signed)
Patient left a voicemail on phone that Dr. Bertis Ruddy , Oncologist  Had noticed a place on his throat for quiet sometime now. Dr.[name] says he cant' see anything. All my medication are getting caught in his throat and just having a time getting them to go down. Patient states that he has to drink hot coffee to melt the pills and get them to go down like that, he feels that this is acceptable.  He would like a call back to try and get in to see Dr.[name] sometime or me tell him a name of another doctor that he can get in to see.   Patient currently has appointment with Dr.[name] on December 18 2020.  Message is being sent to Dr.[name] for review and recommendation.

## 2020-09-26 NOTE — Telephone Encounter (Signed)
Patient's last exam or EGD was in June 2020. If he is having swallowing difficulty he will need esophageal dilation unless he feels it is a throat problem. Sounds like he needs to be reevaluated by ENT specialist. Please call patient

## 2020-09-27 NOTE — Telephone Encounter (Signed)
I can see him next Thursday, January 13th at 1:10 PM and he must be here by 1pm for check-in and accompanied by a family member.

## 2020-09-27 NOTE — Telephone Encounter (Signed)
Above noted 

## 2020-09-28 ENCOUNTER — Encounter: Payer: Self-pay | Admitting: Gastroenterology

## 2020-09-28 NOTE — Telephone Encounter (Signed)
New  Pt appt scheduled for 1/26 at 8:40am per Dr. Loletha Carrow.

## 2020-10-09 ENCOUNTER — Encounter: Payer: Self-pay | Admitting: Neurology

## 2020-10-09 ENCOUNTER — Telehealth: Payer: Self-pay | Admitting: Neurology

## 2020-10-09 ENCOUNTER — Ambulatory Visit (INDEPENDENT_AMBULATORY_CARE_PROVIDER_SITE_OTHER): Payer: Medicare Other | Admitting: Neurology

## 2020-10-09 VITALS — BP 118/61 | HR 65 | Ht 73.0 in | Wt 180.0 lb

## 2020-10-09 DIAGNOSIS — R269 Unspecified abnormalities of gait and mobility: Secondary | ICD-10-CM

## 2020-10-09 DIAGNOSIS — M4316 Spondylolisthesis, lumbar region: Secondary | ICD-10-CM

## 2020-10-09 DIAGNOSIS — M542 Cervicalgia: Secondary | ICD-10-CM

## 2020-10-09 DIAGNOSIS — R29898 Other symptoms and signs involving the musculoskeletal system: Secondary | ICD-10-CM

## 2020-10-09 DIAGNOSIS — R200 Anesthesia of skin: Secondary | ICD-10-CM
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
## 2020-10-09 NOTE — Telephone Encounter (Signed)
Medicare/bcbs supp order sent to GI. No auth they will reach out to the patient to schedule.  

## 2020-10-16 ENCOUNTER — Other Ambulatory Visit: Payer: Medicare Other

## 2020-10-17 ENCOUNTER — Encounter: Payer: Self-pay | Admitting: Gastroenterology

## 2020-10-17 ENCOUNTER — Ambulatory Visit (INDEPENDENT_AMBULATORY_CARE_PROVIDER_SITE_OTHER): Payer: Medicare Other | Admitting: Gastroenterology

## 2020-10-17 ENCOUNTER — Other Ambulatory Visit: Payer: Self-pay

## 2020-10-17 VITALS — BP 120/80 | HR 64 | Ht 73.0 in | Wt 179.0 lb

## 2020-10-17 DIAGNOSIS — R1319 Other dysphagia: Secondary | ICD-10-CM | POA: Diagnosis not present

## 2020-10-17 DIAGNOSIS — R1013 Epigastric pain: Secondary | ICD-10-CM | POA: Diagnosis not present

## 2020-10-17 DIAGNOSIS — R1314 Dysphagia, pharyngoesophageal phase: Secondary | ICD-10-CM | POA: Diagnosis not present
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
## 2020-10-17 NOTE — Patient Instructions (Signed)
If you are age 80 or older, your body mass index should be between 23-30. Your Body mass index is 23.62 kg/m. If this is out of the aforementioned range listed, please consider follow up with your Primary Care Provider.  If you are age 64 or younger, your body mass index should be between 19-25. Your Body mass index is 23.62 kg/m. If this is out of the aformentioned range listed, please consider follow up with your Primary Care Provider.   It was a pleasure to see you today!  Dr. Danis  

## 2020-10-26 ENCOUNTER — Other Ambulatory Visit: Payer: Self-pay

## 2020-10-26 ENCOUNTER — Ambulatory Visit
Admission: RE | Admit: 2020-10-26 | Discharge: 2020-10-26 | Disposition: A | Discharge status: Home / Self Care | Payer: Medicare Other | Source: Ambulatory Visit | Attending: Neurology | Admitting: Neurology

## 2020-10-26 DIAGNOSIS — R269 Unspecified abnormalities of gait and mobility: Secondary | ICD-10-CM | POA: Diagnosis not present

## 2020-10-26 DIAGNOSIS — M47816 Spondylosis without myelopathy or radiculopathy, lumbar region: Secondary | ICD-10-CM | POA: Diagnosis not present

## 2020-10-26 DIAGNOSIS — R29898 Other symptoms and signs involving the musculoskeletal system: Secondary | ICD-10-CM | POA: Diagnosis not present

## 2020-10-26 DIAGNOSIS — M542 Cervicalgia: Secondary | ICD-10-CM | POA: Diagnosis not present

## 2020-10-26 DIAGNOSIS — M47817 Spondylosis without myelopathy or radiculopathy, lumbosacral region: Secondary | ICD-10-CM | POA: Diagnosis not present

## 2020-10-26 DIAGNOSIS — R200 Anesthesia of skin: Secondary | ICD-10-CM

## 2020-10-26 DIAGNOSIS — M4316 Spondylolisthesis, lumbar region: Secondary | ICD-10-CM

## 2020-11-05 ENCOUNTER — Telehealth: Payer: Self-pay | Admitting: Neurology

## 2020-11-05 ENCOUNTER — Telehealth: Payer: Self-pay | Admitting: *Deleted

## 2020-11-05 DIAGNOSIS — R200 Anesthesia of skin: Secondary | ICD-10-CM

## 2020-11-05 DIAGNOSIS — R29898 Other symptoms and signs involving the musculoskeletal system: Secondary | ICD-10-CM

## 2020-11-05 NOTE — Telephone Encounter (Signed)
-----   Message from Britt Bottom, MD sent at 11/04/2020 12:50 PM EST ----- Please let them know that the MRI and xrays show arthritic and disc changes but nothing that would cause weakness in his legs --- lets set up NCV/EMG "bilateral leg weakness and numbness"

## 2020-11-05 NOTE — Telephone Encounter (Signed)
LVM for pt relaying MRI/xray results per Dr. Felecia Shelling note. Advised I will have someone reach out to him to get him scheduled for EMG/NCS. I placed order for this.

## 2020-11-13 ENCOUNTER — Encounter (INDEPENDENT_AMBULATORY_CARE_PROVIDER_SITE_OTHER): Payer: Medicare Other | Admitting: Neurology

## 2020-11-13 ENCOUNTER — Ambulatory Visit (INDEPENDENT_AMBULATORY_CARE_PROVIDER_SITE_OTHER): Payer: Medicare Other | Admitting: Neurology

## 2020-11-13 ENCOUNTER — Telehealth: Payer: Self-pay | Admitting: *Deleted

## 2020-11-13 ENCOUNTER — Other Ambulatory Visit: Payer: Self-pay

## 2020-11-13 DIAGNOSIS — G6289 Other specified polyneuropathies: Secondary | ICD-10-CM

## 2020-11-13 DIAGNOSIS — Z0289 Encounter for other administrative examinations: Secondary | ICD-10-CM

## 2020-11-13 DIAGNOSIS — R29898 Other symptoms and signs involving the musculoskeletal system: Secondary | ICD-10-CM

## 2020-11-13 DIAGNOSIS — R200 Anesthesia of skin: Secondary | ICD-10-CM

## 2020-11-13 DIAGNOSIS — M4316 Spondylolisthesis, lumbar region: Secondary | ICD-10-CM | POA: Insufficient documentation

## 2020-11-13 DIAGNOSIS — M4807 Spinal stenosis, lumbosacral region: Secondary | ICD-10-CM | POA: Insufficient documentation

## 2020-11-13 NOTE — Telephone Encounter (Signed)
NCV/EMG scheduled for 11/13/2020.

## 2020-11-13 NOTE — Telephone Encounter (Signed)
-----   Message from Britt Bottom, MD sent at 11/13/2020 12:47 PM EST ----- Regarding: MRI Please let him know that he does not need to have an additional MRI (I was going to order a lumbar MRI because of what we saw at the EMG today.  However, [facility] did an lumbar MRI last year.).  The MRI did show some spinal stenosis but apparently it was not bad enough for surgery.  He should continue to exercise as tolerated.

## 2020-11-13 NOTE — Progress Notes (Signed)
Full Name: George Bradley Gender: Male MRN #: [id number] Date of Birth: 05/23/1941    Visit Date: 11/13/2020 08:56 Age: 80 Years Examining Physician: Arlice Colt, MD  Referring Physician: Arlice Colt, MD    History: Mr. Tuch is a 80 year old man with numbness in his feet and leg pain that worsens with walking.  He also has back pain worse when he bends backwards.  Of note, he has a history of chemotherapy including cisplatin.  Additionally, he has an IgG lambda MGUS.  On exam, strength is normal.  He had reduced pinprick sensation up to the ankle.  There was mildly reduced vibration sensation at the toes but normal vibration sensation at the ankles.  Nerve conduction studies:  Bilateral peroneal and tibial motor responses had normal distal latencies, amplitudes and conduction velocities.  The F-wave latencies were borderline normal.  Bilateral sural and superficial sensory responses had normal peak latencies and amplitudes.  The galvanic sympathetic skin response in the foot was absent.  Electromyography: Needle EMG of selected muscles of the right leg was performed.  There was mild chronic denervation in all of the L4, L5 and S1 innervated muscles.  There was no abnormal spontaneous activity noted.  Impression: This NCV/EMG study shows the following: 1.   He has a small fiber polyneuropathy.  This could be due to either the chemotherapy or to his known MGUS.  There did not appear to be evidence of a large fiber polyneuropathy. 2.   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  This could be due to multiple individual radiculopathies or to spinal stenosis.  Richard A. Felecia Shelling, MD, PhD, FAAN Certified in Neurology, Clinical Neurophysiology, Sleep Medicine, Pain Medicine and Neuroimaging Director, [place] [facility] [address], [place],  [id number] [phone]   Verbal informed consent was obtained from the patient, patient was informed of potential risk of procedure, including bruising, bleeding, hematoma formation, infection, muscle weakness, muscle pain, numbness, among others.        [place]    Nerve / Sites Muscle Latency Ref. Amplitude Ref. Rel Amp Segments Distance Velocity Ref. Area    ms ms mV mV %  cm m/s m/s mVms  L Peroneal - EDB     Ankle EDB 5.4 ?6.5 3.8 ?2.0 100 Ankle - EDB 9   12.9     Fib head EDB 11.8  3.0  77.9 Fib head - Ankle 28 44 ?44 9.9     Pop fossa EDB 14.0  3.1  105 Pop fossa - Fib head 10 44 ?44 12.4         Pop fossa - Ankle      R Peroneal - EDB     Ankle EDB 5.3 ?6.5 4.7 ?2.0 100 Ankle - EDB 9   15.9     Fib head EDB 12.1  3.7  77.8 Fib head - Ankle 30 44 ?44 16.2     Pop fossa EDB 14.4  3.8  104 Pop fossa - Fib head 10 44 ?44 16.1         Pop fossa - Ankle      L Tibial - AH     Ankle AH 4.5 ?5.8 4.5 ?4.0 100 Ankle - AH 9   12.4     Pop fossa AH 14.1  4.4  99 Pop fossa - Ankle 39 41 ?41 14.1  R Tibial -  AH     Ankle AH 4.8 ?5.8 5.0 ?4.0 100 Ankle - AH 9   19.2     Pop fossa AH 14.3  4.1  81 Pop fossa - Ankle 39 41 ?41 19.5             SSR    Nerve / Sites Latency   s  R Sympathetic - Foot     Foot NR          SNC    Nerve / Sites Rec. Site Peak Lat Ref.  Amp Ref. Segments Distance    ms ms V V  cm  L Sural - Ankle (Calf)     Calf Ankle 4.3 ?4.4 7 ?6 Calf - Ankle 14  R Sural - Ankle (Calf)     Calf Ankle 4.3 ?4.4 7 ?6 Calf - Ankle 14  L Superficial peroneal - Ankle     Lat leg Ankle 4.3 ?4.4 7 ?6 Lat leg - Ankle 14  R Superficial peroneal - Ankle     Lat leg Ankle 4.1 ?4.4 8 ?6 Lat leg - Ankle 14             F  Wave    Nerve F Lat Ref.   ms ms  L Tibial - AH 57.8 ?56.0  R Tibial - AH 56.3 ?56.0         EMG Summary Table    Spontaneous MUAP Recruitment  Muscle IA Fib PSW Fasc Other Amp Dur. Poly Pattern  R. Vastus medialis Normal None None None _______ Normal Increased 1+  Reduced  R. Tibialis anterior Normal None None None _______ Increased Increased 1+ Reduced  R. Peroneus longus Normal None None None _______ Normal Increased 1+ Reduced  R. Gastrocnemius (Medial head) Normal None None None _______ Normal Increased 1+ Reduced  R. Abductor hallucis Normal None None None _______ Normal Normal 1+ Reduced  R. Extensor digitorum brevis Normal None None None _______ Increased Increased 1+ Reduced  R. Iliopsoas Normal None None None _______ Normal Normal Normal Normal  R. Gluteus medius Normal None None None _______ Normal Normal 1+ Reduced  R. Adductor longus Normal None None None _______ Normal Normal Normal Normal

## 2020-11-26 DIAGNOSIS — L910 Hypertrophic scar: Secondary | ICD-10-CM | POA: Diagnosis not present

## 2020-11-26 DIAGNOSIS — Z85828 Personal history of other malignant neoplasm of skin: Secondary | ICD-10-CM | POA: Diagnosis not present

## 2020-11-26 DIAGNOSIS — L821 Other seborrheic keratosis: Secondary | ICD-10-CM | POA: Diagnosis not present

## 2020-12-03 ENCOUNTER — Ambulatory Visit: Payer: Medicare Other | Admitting: Neurology

## 2020-12-05 DIAGNOSIS — M545 Low back pain, unspecified: Secondary | ICD-10-CM | POA: Diagnosis not present

## 2020-12-05 DIAGNOSIS — M5459 Other low back pain: Secondary | ICD-10-CM | POA: Diagnosis not present

## 2020-12-18 ENCOUNTER — Ambulatory Visit (INDEPENDENT_AMBULATORY_CARE_PROVIDER_SITE_OTHER): Payer: Medicare Other | Admitting: Internal Medicine

## 2020-12-18 ENCOUNTER — Encounter: Payer: Self-pay | Admitting: Cardiology

## 2020-12-18 ENCOUNTER — Telehealth: Payer: Self-pay | Admitting: Cardiology

## 2020-12-18 NOTE — Telephone Encounter (Signed)
error 

## 2020-12-18 NOTE — Telephone Encounter (Signed)
He does have a history of orthostasis but these symptoms seem different than he has experienced in the past. I think ED evaluation is appropriate  Narelle Schoening [place] MD, [facility]

## 2020-12-18 NOTE — Telephone Encounter (Signed)
     STAT if patient feels like he/she is going to faint   1) Are you dizzy now? Yes  2) Do you feel faint or have you passed out? Feel faint  3) Do you have any other symptoms? orthostatic hypotension  4) Have you checked your HR and BP (record if available)?   Pt has chronic orthostatic hypotension, however, this time it is getting worst. Pt's legs is getting weaker, his legs always shaking. Pt kept falling since last night. They were told to call Dr. Martinique by pcp

## 2020-12-18 NOTE — Telephone Encounter (Signed)
Called wife back.  She states that yesterday the patient began having issues with double vision (at times triple vision), and having extreme dizziness. They state no other symptoms (chest pains, shortness of breath) but the dizziness and being very lightheaded has caused him to fall over yesterday while trying to walk, and then he fell twice this morning at home before his wife got up, due to the dizziness. She states she checked him over for stroke symptoms and he did not have any of those. She states blood pressure while sitting was 137/71 HR 70, but while standing it was 94/51 HR 74.  Wife states that they called PCP office, and they suggested to call us. I asked patient wife if he was drinking and eating, she states that he does not drink enough and he was pale and weak, my thoughts could be some dehydration, patient and wife agreed to go to ER to be evaluated. I did advise I would send a message to MD and would call with other recommendations.   Wife verbalized understanding.

## 2020-12-19 ENCOUNTER — Inpatient Hospital Stay (HOSPITAL_COMMUNITY)
Admission: EM | Admit: 2020-12-19 | Discharge: 2020-12-21 | DRG: 024 | Disposition: A | Discharge status: Home / Self Care | Payer: Medicare Other | Attending: Neurology | Admitting: Neurology

## 2020-12-19 ENCOUNTER — Encounter (HOSPITAL_COMMUNITY)
Admission: EM | Disposition: A | Discharge status: Home / Self Care | Payer: Self-pay | Source: Home / Self Care | Attending: Neurology

## 2020-12-19 ENCOUNTER — Inpatient Hospital Stay (HOSPITAL_COMMUNITY): Payer: Medicare Other

## 2020-12-19 ENCOUNTER — Inpatient Hospital Stay (HOSPITAL_COMMUNITY): Payer: Medicare Other | Admitting: Anesthesiology

## 2020-12-19 ENCOUNTER — Emergency Department (HOSPITAL_COMMUNITY): Payer: Medicare Other

## 2020-12-19 ENCOUNTER — Other Ambulatory Visit: Payer: Self-pay

## 2020-12-19 ENCOUNTER — Encounter (HOSPITAL_COMMUNITY): Payer: Self-pay

## 2020-12-19 DIAGNOSIS — I63511 Cerebral infarction due to unspecified occlusion or stenosis of right middle cerebral artery: Secondary | ICD-10-CM | POA: Diagnosis present

## 2020-12-19 DIAGNOSIS — G8194 Hemiplegia, unspecified affecting left nondominant side: Secondary | ICD-10-CM | POA: Diagnosis present

## 2020-12-19 DIAGNOSIS — R419 Unspecified symptoms and signs involving cognitive functions and awareness: Secondary | ICD-10-CM | POA: Diagnosis present

## 2020-12-19 DIAGNOSIS — K219 Gastro-esophageal reflux disease without esophagitis: Secondary | ICD-10-CM | POA: Diagnosis present

## 2020-12-19 DIAGNOSIS — H518 Other specified disorders of binocular movement: Secondary | ICD-10-CM | POA: Diagnosis present

## 2020-12-19 DIAGNOSIS — I6389 Other cerebral infarction: Secondary | ICD-10-CM | POA: Diagnosis not present

## 2020-12-19 DIAGNOSIS — R251 Tremor, unspecified: Secondary | ICD-10-CM | POA: Diagnosis present

## 2020-12-19 DIAGNOSIS — I63231 Cerebral infarction due to unspecified occlusion or stenosis of right carotid arteries: Secondary | ICD-10-CM | POA: Diagnosis not present

## 2020-12-19 DIAGNOSIS — G8929 Other chronic pain: Secondary | ICD-10-CM | POA: Diagnosis present

## 2020-12-19 DIAGNOSIS — E890 Postprocedural hypothyroidism: Secondary | ICD-10-CM | POA: Diagnosis present

## 2020-12-19 DIAGNOSIS — E039 Hypothyroidism, unspecified: Secondary | ICD-10-CM | POA: Diagnosis not present

## 2020-12-19 DIAGNOSIS — Z9221 Personal history of antineoplastic chemotherapy: Secondary | ICD-10-CM

## 2020-12-19 DIAGNOSIS — I25118 Atherosclerotic heart disease of native coronary artery with other forms of angina pectoris: Secondary | ICD-10-CM | POA: Diagnosis not present

## 2020-12-19 DIAGNOSIS — F0390 Unspecified dementia without behavioral disturbance: Secondary | ICD-10-CM | POA: Diagnosis present

## 2020-12-19 DIAGNOSIS — R55 Syncope and collapse: Secondary | ICD-10-CM | POA: Diagnosis not present

## 2020-12-19 DIAGNOSIS — Z951 Presence of aortocoronary bypass graft: Secondary | ICD-10-CM

## 2020-12-19 DIAGNOSIS — I350 Nonrheumatic aortic (valve) stenosis: Secondary | ICD-10-CM | POA: Diagnosis not present

## 2020-12-19 DIAGNOSIS — R471 Dysarthria and anarthria: Secondary | ICD-10-CM | POA: Diagnosis present

## 2020-12-19 DIAGNOSIS — I6521 Occlusion and stenosis of right carotid artery: Secondary | ICD-10-CM | POA: Diagnosis present

## 2020-12-19 DIAGNOSIS — Z803 Family history of malignant neoplasm of breast: Secondary | ICD-10-CM

## 2020-12-19 DIAGNOSIS — T451X5A Adverse effect of antineoplastic and immunosuppressive drugs, initial encounter: Secondary | ICD-10-CM | POA: Diagnosis present

## 2020-12-19 DIAGNOSIS — E7800 Pure hypercholesterolemia, unspecified: Secondary | ICD-10-CM | POA: Diagnosis present

## 2020-12-19 DIAGNOSIS — I639 Cerebral infarction, unspecified: Secondary | ICD-10-CM | POA: Diagnosis not present

## 2020-12-19 DIAGNOSIS — I63411 Cerebral infarction due to embolism of right middle cerebral artery: Principal | ICD-10-CM | POA: Diagnosis present

## 2020-12-19 DIAGNOSIS — R42 Dizziness and giddiness: Secondary | ICD-10-CM | POA: Diagnosis not present

## 2020-12-19 DIAGNOSIS — I6522 Occlusion and stenosis of left carotid artery: Secondary | ICD-10-CM | POA: Diagnosis not present

## 2020-12-19 DIAGNOSIS — R531 Weakness: Secondary | ICD-10-CM | POA: Diagnosis not present

## 2020-12-19 DIAGNOSIS — I10 Essential (primary) hypertension: Secondary | ICD-10-CM | POA: Diagnosis present

## 2020-12-19 DIAGNOSIS — H532 Diplopia: Secondary | ICD-10-CM | POA: Diagnosis not present

## 2020-12-19 DIAGNOSIS — R29810 Facial weakness: Secondary | ICD-10-CM | POA: Diagnosis not present

## 2020-12-19 DIAGNOSIS — I2510 Atherosclerotic heart disease of native coronary artery without angina pectoris: Secondary | ICD-10-CM | POA: Diagnosis present

## 2020-12-19 DIAGNOSIS — Z20822 Contact with and (suspected) exposure to covid-19: Secondary | ICD-10-CM | POA: Diagnosis present

## 2020-12-19 DIAGNOSIS — G620 Drug-induced polyneuropathy: Secondary | ICD-10-CM | POA: Diagnosis present

## 2020-12-19 DIAGNOSIS — I700 Atherosclerosis of aorta: Secondary | ICD-10-CM | POA: Diagnosis present

## 2020-12-19 DIAGNOSIS — Z9181 History of falling: Secondary | ICD-10-CM

## 2020-12-19 DIAGNOSIS — I6601 Occlusion and stenosis of right middle cerebral artery: Secondary | ICD-10-CM | POA: Diagnosis not present

## 2020-12-19 DIAGNOSIS — Z79899 Other long term (current) drug therapy: Secondary | ICD-10-CM

## 2020-12-19 DIAGNOSIS — Z7982 Long term (current) use of aspirin: Secondary | ICD-10-CM

## 2020-12-19 DIAGNOSIS — N400 Enlarged prostate without lower urinary tract symptoms: Secondary | ICD-10-CM | POA: Diagnosis not present

## 2020-12-19 DIAGNOSIS — I701 Atherosclerosis of renal artery: Secondary | ICD-10-CM | POA: Diagnosis present

## 2020-12-19 DIAGNOSIS — R29719 NIHSS score 19: Secondary | ICD-10-CM | POA: Diagnosis present

## 2020-12-19 DIAGNOSIS — Z8249 Family history of ischemic heart disease and other diseases of the circulatory system: Secondary | ICD-10-CM

## 2020-12-19 DIAGNOSIS — Z87891 Personal history of nicotine dependence: Secondary | ICD-10-CM

## 2020-12-19 DIAGNOSIS — Z85818 Personal history of malignant neoplasm of other sites of lip, oral cavity, and pharynx: Secondary | ICD-10-CM

## 2020-12-19 DIAGNOSIS — E785 Hyperlipidemia, unspecified: Secondary | ICD-10-CM | POA: Diagnosis not present

## 2020-12-19 DIAGNOSIS — Z9889 Other specified postprocedural states: Secondary | ICD-10-CM | POA: Diagnosis not present

## 2020-12-19 DIAGNOSIS — Z85810 Personal history of malignant neoplasm of tongue: Secondary | ICD-10-CM

## 2020-12-19 DIAGNOSIS — R29898 Other symptoms and signs involving the musculoskeletal system: Secondary | ICD-10-CM

## 2020-12-19 DIAGNOSIS — H53462 Homonymous bilateral field defects, left side: Secondary | ICD-10-CM | POA: Diagnosis present

## 2020-12-19 DIAGNOSIS — R270 Ataxia, unspecified: Secondary | ICD-10-CM | POA: Diagnosis not present

## 2020-12-19 DIAGNOSIS — Z79890 Hormone replacement therapy (postmenopausal): Secondary | ICD-10-CM

## 2020-12-19 DIAGNOSIS — I951 Orthostatic hypotension: Secondary | ICD-10-CM | POA: Diagnosis not present

## 2020-12-19 DIAGNOSIS — Z923 Personal history of irradiation: Secondary | ICD-10-CM

## 2020-12-19 DIAGNOSIS — Z87440 Personal history of urinary (tract) infections: Secondary | ICD-10-CM

## 2020-12-19 HISTORY — PX: IR US GUIDE VASC ACCESS RIGHT: IMG2390

## 2020-12-19 HISTORY — PX: IR INTRAVSC STENT CERV CAROTID W/O EMB-PROT MOD SED INC ANGIO: IMG2304

## 2020-12-19 HISTORY — PX: RADIOLOGY WITH ANESTHESIA: SHX6223

## 2020-12-19 HISTORY — PX: IR CT HEAD LTD: IMG2386

## 2020-12-19 HISTORY — PX: IR PERCUTANEOUS ART THROMBECTOMY/INFUSION INTRACRANIAL INC DIAG ANGIO: IMG6087

## 2020-12-19 LAB — CBC
HCT: 42.8 % (ref 39.0–52.0)
HCT: 43.7 % (ref 39.0–52.0)
Hemoglobin: 14.1 g/dL (ref 13.0–17.0)
Hemoglobin: 14.5 g/dL (ref 13.0–17.0)
MCH: 32.8 pg (ref 26.0–34.0)
MCH: 32.9 pg (ref 26.0–34.0)
MCHC: 32.9 g/dL (ref 30.0–36.0)
MCHC: 33.2 g/dL (ref 30.0–36.0)
MCV: 99.5 fL (ref 80.0–100.0)
MCV: 99.1 fL (ref 80.0–100.0)
Platelets: 152 10*3/uL (ref 150–400)
Platelets: 147 10*3/uL — ABNORMAL LOW (ref 150–400)
RBC: 4.3 MIL/uL (ref 4.22–5.81)
RBC: 4.41 MIL/uL (ref 4.22–5.81)
RDW: 14.2 % (ref 11.5–15.5)
RDW: 14.6 % (ref 11.5–15.5)
WBC: 12.3 10*3/uL — ABNORMAL HIGH (ref 4.0–10.5)
WBC: 12.5 10*3/uL — ABNORMAL HIGH (ref 4.0–10.5)
nRBC: 0 % (ref 0.0–0.2)
nRBC: 0 % (ref 0.0–0.2)

## 2020-12-19 LAB — BASIC METABOLIC PANEL
Anion gap: 9 (ref 5–15)
BUN: 23 mg/dL (ref 8–23)
CO2: 27 mmol/L (ref 22–32)
Calcium: 8.7 mg/dL — ABNORMAL LOW (ref 8.9–10.3)
Chloride: 101 mmol/L (ref 98–111)
Creatinine, Ser: 1.19 mg/dL (ref 0.61–1.24)
GFR, Estimated: >60 mL/min (ref >60)
Glucose, Bld: 96 mg/dL (ref 70–99)
Potassium: 4.4 mmol/L (ref 3.5–5.1)
Sodium: 137 mmol/L (ref 135–145)

## 2020-12-19 LAB — CBG MONITORING, ED: Glucose-Capillary: 97 mg/dL (ref 70–99)

## 2020-12-19 LAB — RESP PANEL BY RT-PCR (FLU A&B, COVID) ARPGX2
Influenza A by PCR: NEGATIVE
Influenza B by PCR: NEGATIVE
SARS Coronavirus 2 by RT PCR: NEGATIVE

## 2020-12-19 LAB — HEPATIC FUNCTION PANEL
ALT: 44 U/L (ref 0–44)
AST: 27 U/L (ref 15–41)
Albumin: 4 g/dL (ref 3.5–5.0)
Alkaline Phosphatase: 31 U/L — ABNORMAL LOW (ref 38–126)
Bilirubin, Direct: 0.1 mg/dL (ref 0.0–0.2)
Indirect Bilirubin: 0.4 mg/dL (ref 0.3–0.9)
Total Bilirubin: 0.5 mg/dL (ref 0.3–1.2)
Total Protein: 7.3 g/dL (ref 6.5–8.1)

## 2020-12-19 LAB — APTT: aPTT: 33 seconds (ref 24–36)

## 2020-12-19 LAB — PROTIME-INR
INR: 1 (ref 0.8–1.2)
Prothrombin Time: 12.5 seconds (ref 11.4–15.2)

## 2020-12-19 SURGERY — IR WITH ANESTHESIA
Anesthesia: General

## 2020-12-19 MED ORDER — CLOPIDOGREL BISULFATE 300 MG PO TABS
ORAL_TABLET | ORAL | Status: AC
  Filled 2020-12-19: qty 1

## 2020-12-19 MED ORDER — IOHEXOL 240 MG/ML SOLN
150.0000 mL | Freq: Once | INTRAMUSCULAR | Status: AC | PRN
  Administered 2020-12-19: 75 mL via INTRA_ARTERIAL

## 2020-12-19 MED ORDER — SODIUM CHLORIDE 0.9 % IV SOLN
2.0000 ug/kg/min | INTRAVENOUS | Status: DC
  Administered 2020-12-19: 2 ug/kg/min via INTRAVENOUS
  Filled 2020-12-19 (×2): qty 50

## 2020-12-19 MED ORDER — ONDANSETRON HCL 4 MG/2ML IJ SOLN
INTRAMUSCULAR | Status: DC | PRN
  Administered 2020-12-19: 4 mg via INTRAVENOUS

## 2020-12-19 MED ORDER — PHENYLEPHRINE HCL (PRESSORS) 10 MG/ML IV SOLN: INTRAVENOUS | Status: DC | PRN

## 2020-12-19 MED ORDER — CLEVIDIPINE BUTYRATE 0.5 MG/ML IV EMUL
INTRAVENOUS | Status: AC
  Filled 2020-12-19: qty 50

## 2020-12-19 MED ORDER — STROKE: EARLY STAGES OF RECOVERY BOOK
Freq: Once | Status: AC
  Filled 2020-12-19 (×2): qty 1

## 2020-12-19 MED ORDER — ROCURONIUM 10MG/ML (10ML) SYRINGE FOR MEDFUSION PUMP - OPTIME
INTRAVENOUS | Status: DC | PRN
  Administered 2020-12-19: 20 mg via INTRAVENOUS
  Administered 2020-12-19: 40 mg via INTRAVENOUS

## 2020-12-19 MED ORDER — TICAGRELOR 90 MG PO TABS
ORAL_TABLET | ORAL | Status: AC
  Filled 2020-12-19: qty 2

## 2020-12-19 MED ORDER — SENNOSIDES-DOCUSATE SODIUM 8.6-50 MG PO TABS
1.0000 | ORAL_TABLET | Freq: Every evening | ORAL | Status: DC | PRN
  Filled 2020-12-19: qty 1

## 2020-12-19 MED ORDER — IOHEXOL 350 MG/ML SOLN
75.0000 mL | Freq: Once | INTRAVENOUS | Status: AC | PRN
  Administered 2020-12-19: 75 mL via INTRAVENOUS

## 2020-12-19 MED ORDER — EPTIFIBATIDE 20 MG/10ML IV SOLN
INTRAVENOUS | Status: AC
  Filled 2020-12-19: qty 10

## 2020-12-19 MED ORDER — IOHEXOL 240 MG/ML SOLN
INTRAMUSCULAR | Status: AC
  Filled 2020-12-19: qty 200

## 2020-12-19 MED ORDER — PROPOFOL 10 MG/ML IV BOLUS
INTRAVENOUS | Status: DC | PRN
  Administered 2020-12-19: 115 ug via INTRAVENOUS

## 2020-12-19 MED ORDER — SODIUM CHLORIDE 0.9 % IV SOLN: INTRAVENOUS | Status: DC

## 2020-12-19 MED ORDER — SODIUM CHLORIDE 0.9 % IV BOLUS
1000.0000 mL | Freq: Once | INTRAVENOUS | Status: AC
  Administered 2020-12-19: 1000 mL via INTRAVENOUS

## 2020-12-19 MED ORDER — PHENYLEPHRINE HCL-NACL 10-0.9 MG/250ML-% IV SOLN
INTRAVENOUS | Status: DC | PRN
  Administered 2020-12-19: 50 ug/min via INTRAVENOUS

## 2020-12-19 MED ORDER — ACETAMINOPHEN 650 MG RE SUPP: 650.0000 mg | RECTAL | Status: DC | PRN

## 2020-12-19 MED ORDER — CLEVIDIPINE BUTYRATE 0.5 MG/ML IV EMUL
0.0000 mg/h | INTRAVENOUS | Status: DC
  Administered 2020-12-19: 5 mg/h via INTRAVENOUS
  Administered 2020-12-19 – 2020-12-20 (×2): 15 mg/h via INTRAVENOUS
  Administered 2020-12-20: 13 mg/h via INTRAVENOUS
  Filled 2020-12-19: qty 100
  Filled 2020-12-19: qty 50
  Filled 2020-12-19: qty 200

## 2020-12-19 MED ORDER — LABETALOL HCL 5 MG/ML IV SOLN
INTRAVENOUS | Status: DC | PRN
  Administered 2020-12-19: 10 mg via INTRAVENOUS

## 2020-12-19 MED ORDER — GLYCOPYRROLATE 0.2 MG/ML IJ SOLN
INTRAMUSCULAR | Status: DC | PRN
  Administered 2020-12-19: .2 mg via INTRAVENOUS

## 2020-12-19 MED ORDER — SODIUM CHLORIDE 0.9 % IV SOLN
INTRAVENOUS | Status: DC | PRN
Start: 2020-12-19 — End: 2020-12-19

## 2020-12-19 MED ORDER — IOHEXOL 300 MG/ML  SOLN
50.0000 mL | Freq: Once | INTRAMUSCULAR | Status: AC | PRN
  Administered 2020-12-19: 30 mL via INTRA_ARTERIAL

## 2020-12-19 MED ORDER — SUGAMMADEX SODIUM 200 MG/2ML IV SOLN
INTRAVENOUS | Status: DC | PRN
  Administered 2020-12-19: 200 mg via INTRAVENOUS

## 2020-12-19 MED ORDER — IOHEXOL 350 MG/ML SOLN
100.0000 mL | Freq: Once | INTRAVENOUS | Status: AC | PRN
  Administered 2020-12-19: 100 mL via INTRAVENOUS

## 2020-12-19 MED ORDER — EPHEDRINE SULFATE 50 MG/ML IJ SOLN
INTRAMUSCULAR | Status: DC | PRN
  Administered 2020-12-19 (×2): 10 mg via INTRAVENOUS

## 2020-12-19 MED ORDER — VERAPAMIL HCL 2.5 MG/ML IV SOLN
INTRAVENOUS | Status: AC
  Administered 2020-12-19: 5 mg via INTRA_ARTERIAL
  Filled 2020-12-19: qty 2

## 2020-12-19 MED ORDER — SODIUM CHLORIDE 0.9 % IV SOLN: INTRAVENOUS | Status: DC | PRN

## 2020-12-19 MED ORDER — ASPIRIN 81 MG PO CHEW
CHEWABLE_TABLET | ORAL | Status: AC
  Filled 2020-12-19: qty 1

## 2020-12-19 MED ORDER — CANGRELOR TETRASODIUM 50 MG IV SOLR
INTRAVENOUS | Status: AC
  Filled 2020-12-19: qty 50

## 2020-12-19 MED ORDER — LIDOCAINE HCL (CARDIAC) PF 100 MG/5ML IV SOSY
PREFILLED_SYRINGE | INTRAVENOUS | Status: DC | PRN
  Administered 2020-12-19: 70 mg via INTRAVENOUS

## 2020-12-19 MED ORDER — TIROFIBAN HCL IN NACL 5-0.9 MG/100ML-% IV SOLN
INTRAVENOUS | Status: AC
  Filled 2020-12-19: qty 100

## 2020-12-19 MED ORDER — ACETAMINOPHEN 160 MG/5ML PO SOLN: 650.0000 mg | ORAL | Status: DC | PRN

## 2020-12-19 MED ORDER — IOHEXOL 240 MG/ML SOLN: 150.0000 mL | Freq: Once | INTRAMUSCULAR | Status: DC | PRN

## 2020-12-19 MED ORDER — ENOXAPARIN SODIUM 40 MG/0.4ML ~~LOC~~ SOLN
40.0000 mg | SUBCUTANEOUS | Status: DC
  Administered 2020-12-20 – 2020-12-21 (×2): 40 mg via SUBCUTANEOUS
  Filled 2020-12-19 (×2): qty 0.4

## 2020-12-19 MED ORDER — CHLORHEXIDINE GLUCONATE CLOTH 2 % EX PADS
6.0000 | MEDICATED_PAD | Freq: Every day | CUTANEOUS | Status: DC
  Administered 2020-12-20: 6 via TOPICAL

## 2020-12-19 MED ORDER — SUCCINYLCHOLINE 20MG/ML (10ML) SYRINGE FOR MEDFUSION PUMP - OPTIME
INTRAMUSCULAR | Status: DC | PRN
  Administered 2020-12-19: 120 mg via INTRAVENOUS

## 2020-12-19 MED ORDER — IOHEXOL 300 MG/ML  SOLN: 50.0000 mL | Freq: Once | INTRAMUSCULAR | Status: DC | PRN

## 2020-12-19 MED ORDER — ACETAMINOPHEN 325 MG PO TABS
650.0000 mg | ORAL_TABLET | ORAL | Status: DC | PRN
  Administered 2020-12-21: 650 mg via ORAL
  Filled 2020-12-19 (×3): qty 2

## 2020-12-19 MED ORDER — CANGRELOR BOLUS VIA INFUSION
30.0000 ug/kg | Freq: Once | INTRAVENOUS | Status: AC
  Administered 2020-12-19: 2409 ug via INTRAVENOUS
  Filled 2020-12-19: qty 2409

## 2020-12-19 NOTE — Anesthesia Procedure Notes (Signed)
Procedure Name: Intubation Date/Time: 12/19/2020 7:00 PM Performed by: Valetta Fuller, CRNA Pre-anesthesia Checklist: Patient identified, Emergency Drugs available, Suction available and Patient being monitored Patient Re-evaluated:Patient Re-evaluated prior to induction Oxygen Delivery Method: Circle system utilized Preoxygenation: Pre-oxygenation with 100% oxygen Induction Type: IV induction, Rapid sequence and Cricoid Pressure applied Laryngoscope Size: Miller and 2 Grade View: Grade I Tube type: Oral Tube size: 8.0 mm Number of attempts: 1 Airway Equipment and Method: Stylet Placement Confirmation: ETT inserted through vocal cords under direct vision,  CO2 detector and breath sounds checked- equal and bilateral Secured at: 23 cm Tube secured with: Tape Dental Injury: Teeth and Oropharynx as per pre-operative assessment

## 2020-12-19 NOTE — ED Notes (Signed)
[facility] called for transport. 

## 2020-12-19 NOTE — ED Notes (Signed)
Report called to [facility]

## 2020-12-19 NOTE — Transfer of Care (Signed)
Immediate Anesthesia Transfer of Care Note  Patient: George Bradley  Procedure(s) Performed: IR WITH ANESTHESIA (N/A )  Patient Location: PACU  Anesthesia Type:General  Level of Consciousness: awake and alert   Airway & Oxygen Therapy: Patient connected to face mask oxygen  Post-op Assessment: Report given to RN and Post -op Vital signs reviewed and stable  Post vital signs: Reviewed and stable  Last Vitals:  Vitals Value Taken Time  BP 150/122 12/19/20 2113  Temp    Pulse 71 12/19/20 2122  Resp 22 12/19/20 2122  SpO2 96 % 12/19/20 2122  Vitals shown include unvalidated device data.  Last Pain:  Vitals:   12/19/20 1143  TempSrc:   PainSc: 0-No pain         Complications: No complications documented.

## 2020-12-19 NOTE — Code Documentation (Signed)
Stroke Response Nurse Documentation Code Documentation  George Bradley is a 80 y.o. male arriving to [place] via [place] on 12/19/2020 with past medical hx of aortic atherosclerosis, bilateral renal artery stenosis, chronic kidney disease, coronary artery disease, gastroesophageal reflux disease, hyperlipidemia, chronic orthostatic hypotension, and BPH. Code stroke was activated by [facility]. Patient direct transfer from [facility] ED where he was LKW at 1740 and now complaining of left sided weakness, left facial droop. On aspirin 81 mg daily. Stroke team at the bedside on patient arrival. Labs drawn and patient cleared for CT by EDP. Patient to CT with team. NIHSS 19, see documentation for details and code stroke times. Patient with decreased LOC, not following commands, right gaze preference , left hemianopia, left facial droop, left arm weakness, left leg weakness, left decreased sensation, dysarthria and neglect on exam. The following imaging was completed: CT, CTA head and neck. IR activated. Pt transported to IR.   Report given to IR RN [place]  Rapid Response RN
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
## 2020-12-19 NOTE — Procedures (Signed)
INTERVENTIONAL NEURORADIOLOGY BRIEF POSTPROCEDURE NOTE  CEREBRAL ANGIOGRAM, MECHANICAL THROMBECTOMY, RIGHT CAROTID STENTING  Attending: Dr. Pedro Earls  Assistant: None.  Diagnosis: Occlusion of right common carotid, ICA, M1/MCA  Access site: 71F, RCFA  Access closure: 71F angioseal  Anesthesia: General anesthesia  Medication used: Cangrelor infusion + bolus.  Complications: None.  Estimated blood loss: 60 mL  Specimen: None.  Findings: Occlusion at the level of the right carotid bifurcation and reocclusion at the petrous segment. Catheter advanced into the cervical right ICA. Aspiration performed with recanalization of intracranial ICA and MCA. Angioplasty performed at the bulb followed by stent deployment across the carotid bifurcation. Reocclusion of the proximal right M1/MCA. Thrombectomy performed with direct contact aspiration. Complete recanalization achieved (TICI3). No hemorrhagic complication on flat panel CT.  PLAN:  - Continue cangrelor drip until head CT obtained in 4 hours (00:15, 12/20/20). Discontinue cangrelor and start ASA + brilinta if CT clear. - SBP 120-140 mmHg. - Bed rest x6 hours    The patient tolerated the procedure well without incident or complication and is in stable condition.

## 2020-12-19 NOTE — ED Notes (Signed)
Pt became orthstatic and blood pressure dropped. Pt believes he may be dehydrated. I did not continue the orthostatics.

## 2020-12-19 NOTE — ED Triage Notes (Signed)
Pt arrived via walk in, states worsening weakness and dizzy spells x1 month, worsening this week. States he has been eating okay. Endorses weakness throughout entire body, fall x2 days ago, able to catch self. No neuro deficits.

## 2020-12-19 NOTE — Anesthesia Preprocedure Evaluation (Addendum)
Anesthesia Evaluation  Patient identified by MRN, date of birth, ID band Patient awake  Preop documentation limited or incomplete due to emergent nature of procedure.  Airway Mallampati: II  TM Distance: >3 FB     Dental   Pulmonary former smoker,    breath sounds clear to auscultation       Cardiovascular + angina + CAD and + Peripheral Vascular Disease  + dysrhythmias + Valvular Problems/Murmurs  Rhythm:Regular Rate:Normal     Neuro/Psych    GI/Hepatic GERD  ,  Endo/Other  Hypothyroidism   Renal/GU Renal disease     Musculoskeletal   Abdominal   Peds  Hematology   Anesthesia Other Findings   Reproductive/Obstetrics                            Anesthesia Physical Anesthesia Plan  ASA: III  Anesthesia Plan: General   Post-op Pain Management:    Induction: Intravenous  PONV Risk Score and Plan: Treatment may vary due to age or medical condition  Airway Management Planned: Oral ETT and Video Laryngoscope Planned  Additional Equipment:   Intra-op Plan:   Post-operative Plan: Possible Post-op intubation/ventilation and Extubation in OR  Informed Consent: I have reviewed the patients History and Physical, chart, labs and discussed the procedure including the risks, benefits and alternatives for the proposed anesthesia with the patient or authorized representative who has indicated his/her understanding and acceptance.     Dental advisory given  Plan Discussed with: Anesthesiologist and CRNA  Anesthesia Plan Comments:        Anesthesia Quick Evaluation

## 2020-12-19 NOTE — ED Provider Notes (Signed)
  Physical Exam  BP 122/60   Pulse 60   Temp 97.7 F (36.5 C) (Oral)   Resp 16   Ht 6\' 1"  (1.854 m)   Wt 80.3 kg   SpO2 96%   BMI 23.35 kg/m   Physical Exam  ED Course/Procedures     Procedures  MDM  Transfer from [facility], was supposed to be direct admit for CVA but developed sudden left sided weakness en route and code  Stroke called by [facility].   Brought for repeat CTA showing  New right MCA and M2 occlusion, taken to IR for further care.      Gareth Morgan, MD 12/21/20 1220
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
## 2020-12-20 ENCOUNTER — Inpatient Hospital Stay (HOSPITAL_COMMUNITY): Payer: Medicare Other

## 2020-12-20 ENCOUNTER — Encounter (HOSPITAL_COMMUNITY): Payer: Self-pay | Admitting: Radiology

## 2020-12-20 DIAGNOSIS — I6389 Other cerebral infarction: Secondary | ICD-10-CM

## 2020-12-20 LAB — ECHOCARDIOGRAM COMPLETE
AR max vel: 1.37 cm2
AV Area VTI: 1.42 cm2
AV Area mean vel: 1.27 cm2
AV Mean grad: 21 mmHg
AV Peak grad: 31.8 mmHg
Ao pk vel: 2.82 m/s
Area-P 1/2: 2.69 cm2
Height: 73 in
S' Lateral: 3.6 cm
Weight: 2832 oz

## 2020-12-20 LAB — COMPREHENSIVE METABOLIC PANEL
ALT: 37 U/L (ref 0–44)
AST: 27 U/L (ref 15–41)
Albumin: 3.4 g/dL — ABNORMAL LOW (ref 3.5–5.0)
Alkaline Phosphatase: 27 U/L — ABNORMAL LOW (ref 38–126)
Anion gap: 4 — ABNORMAL LOW (ref 5–15)
BUN: 16 mg/dL (ref 8–23)
CO2: 27 mmol/L (ref 22–32)
Calcium: 8.2 mg/dL — ABNORMAL LOW (ref 8.9–10.3)
Chloride: 107 mmol/L (ref 98–111)
Creatinine, Ser: 1.03 mg/dL (ref 0.61–1.24)
GFR, Estimated: >60 mL/min (ref >60)
Glucose, Bld: 107 mg/dL — ABNORMAL HIGH (ref 70–99)
Potassium: 4.3 mmol/L (ref 3.5–5.1)
Sodium: 138 mmol/L (ref 135–145)
Total Bilirubin: 0.6 mg/dL (ref 0.3–1.2)
Total Protein: 6.4 g/dL — ABNORMAL LOW (ref 6.5–8.1)

## 2020-12-20 LAB — CBC
HCT: 43.7 % (ref 39.0–52.0)
Hemoglobin: 14.4 g/dL (ref 13.0–17.0)
MCH: 33 pg (ref 26.0–34.0)
MCHC: 33 g/dL (ref 30.0–36.0)
MCV: 100.2 fL — ABNORMAL HIGH (ref 80.0–100.0)
Platelets: 138 10*3/uL — ABNORMAL LOW (ref 150–400)
RBC: 4.36 MIL/uL (ref 4.22–5.81)
RDW: 14.7 % (ref 11.5–15.5)
WBC: 13.6 10*3/uL — ABNORMAL HIGH (ref 4.0–10.5)
nRBC: 0 % (ref 0.0–0.2)

## 2020-12-20 LAB — MRSA PCR SCREENING: MRSA by PCR: NEGATIVE

## 2020-12-20 LAB — LIPID PANEL
Cholesterol: 113 mg/dL (ref 0–200)
HDL: 62 mg/dL (ref >40)
LDL Cholesterol: 30 mg/dL (ref 0–99)
Total CHOL/HDL Ratio: 1.8 RATIO
Triglycerides: 106 mg/dL (ref <150)
VLDL: 21 mg/dL (ref 0–40)

## 2020-12-20 LAB — HEMOGLOBIN A1C
Hgb A1c MFr Bld: 6.3 % — ABNORMAL HIGH (ref 4.8–5.6)
Mean Plasma Glucose: 134.11 mg/dL

## 2020-12-20 LAB — CREATININE, SERUM
Creatinine, Ser: 1.04 mg/dL (ref 0.61–1.24)
GFR, Estimated: >60 mL/min (ref >60)

## 2020-12-20 MED ORDER — ROSUVASTATIN CALCIUM 20 MG PO TABS
20.0000 mg | ORAL_TABLET | Freq: Every day | ORAL | Status: DC
Start: 2020-12-20 — End: 2020-12-21
  Administered 2020-12-20 – 2020-12-21 (×2): 20 mg via ORAL
  Filled 2020-12-20 (×2): qty 1

## 2020-12-20 MED ORDER — HYDRALAZINE HCL 20 MG/ML IJ SOLN
20.0000 mg | Freq: Four times a day (QID) | INTRAMUSCULAR | Status: DC | PRN
  Administered 2020-12-20: 20 mg via INTRAVENOUS
  Filled 2020-12-20 (×2): qty 1

## 2020-12-20 MED ORDER — ASPIRIN EC 81 MG PO TBEC
81.0000 mg | DELAYED_RELEASE_TABLET | Freq: Every day | ORAL | Status: DC
  Administered 2020-12-20 – 2020-12-21 (×2): 81 mg via ORAL
  Filled 2020-12-20 (×2): qty 1

## 2020-12-20 MED ORDER — GABAPENTIN 300 MG PO CAPS
900.0000 mg | ORAL_CAPSULE | Freq: Three times a day (TID) | ORAL | Status: DC
  Administered 2020-12-20 – 2020-12-21 (×4): 900 mg via ORAL
  Filled 2020-12-20 (×4): qty 3

## 2020-12-20 MED ORDER — TICAGRELOR 90 MG PO TABS
180.0000 mg | ORAL_TABLET | Freq: Once | ORAL | Status: AC
  Administered 2020-12-20: 180 mg via ORAL
  Filled 2020-12-20: qty 2

## 2020-12-20 MED ORDER — LABETALOL HCL 5 MG/ML IV SOLN
20.0000 mg | INTRAVENOUS | Status: DC | PRN
  Administered 2020-12-20: 20 mg via INTRAVENOUS
  Filled 2020-12-20: qty 4

## 2020-12-20 MED ORDER — LORAZEPAM 1 MG PO TABS
1.0000 mg | ORAL_TABLET | Freq: Every day | ORAL | Status: DC
Start: 2020-12-20 — End: 2020-12-21
  Administered 2020-12-20 – 2020-12-21 (×2): 1 mg via ORAL
  Filled 2020-12-20 (×2): qty 1

## 2020-12-20 MED ORDER — TICAGRELOR 90 MG PO TABS
90.0000 mg | ORAL_TABLET | Freq: Two times a day (BID) | ORAL | Status: DC
  Administered 2020-12-20 – 2020-12-21 (×3): 90 mg via ORAL
  Filled 2020-12-20 (×3): qty 1

## 2020-12-20 NOTE — Evaluation (Signed)
Physical Therapy Evaluation Patient Details Name: George Bradley MRN: [id number] DOB: September 05, 1941 Today's Date: 12/20/2020   History of Present Illness  80 yo male presenting 3/30 with left sided weakness and left facial droop. MRI at [facility] revealed two small acute right MCA territory strokes. Neurology was consulted and requested CT angio head and neck with immediate transfer to [facility]. 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 PMH including aortic atherosclerosis, bilateral renal artery stenosis, CKD, CAD, gastroesophageal reflux disease, hyperlipidemia, chronic orthostatic HTN, and BPH.  Clinical Impression  Pt presents with condition above and deficits mentioned below, see PT Problem List. PTA, he was independent without AD/AE for all functional mobility but has been having increased falls recently. Pt demonstrates incoordination, specifically dysdiadochokinesia, at his L leg that impacts his balance. He benefits from having bil UE support for stability and to decrease his risk for falls. Wife reports she has a rollator and RW available at home. Will continue to follow acutely and recommending follow-up with outpatient PT to decrease his risk for further falls and increase his independence and safety with all functional mobility.    Follow Up Recommendations Outpatient PT;Supervision for mobility/OOB    Equipment Recommendations  None recommended by PT (pt's wife reports having RW and rollator at home)    Recommendations for Other Services       Precautions / Restrictions Precautions Precautions: Fall Precaution Comments: monitor BP Restrictions Weight Bearing Restrictions: No      Mobility  Bed Mobility Overal bed mobility: Needs Assistance Bed Mobility: Supine to Sit     Supine to sit: Supervision     General bed mobility comments: Pt walking with OT upon arrival.    Transfers Overall  transfer level: Needs assistance   Transfers: Sit to/from Stand Sit to Stand: Min guard         General transfer comment: Pt walking with OT upon arrival.  Ambulation/Gait Ambulation/Gait assistance: Min guard Gait Distance (Feet): 200 Feet Assistive device: Rolling walker (2 wheeled);None Gait Pattern/deviations: Step-through pattern;Decreased step length - right;Decreased step length - left;Decreased stride length;Narrow base of support Gait velocity: reduced Gait velocity interpretation: <1.8 ft/sec, indicate of risk for recurrent falls General Gait Details: Pt ambulates with narrow BOS when using RW, but widens stance when attempting without AD/AE. Increased instability noted without RW. Intermittent R knee instability and bil ankle lateral instability noted. No overt LOB, min guard for safety. Cues for body positioning in RW during straight gait and turns, good carryover.  Stairs            Wheelchair Mobility    Modified Rankin (Stroke Patients Only) Modified Rankin (Stroke Patients Only) Pre-Morbid Rankin Score: No symptoms Modified Rankin: Moderate disability     Balance Overall balance assessment: Mild deficits observed, not formally tested;Needs assistance Sitting-balance support: No upper extremity supported;Feet supported Sitting balance-Leahy Scale: Good     Standing balance support: No upper extremity supported;During functional activity Standing balance-Leahy Scale: Good Standing balance comment: Pt able to ambulate without UE support, but improved stability and pt comfort with use of RW.                             Pertinent Vitals/Pain Pain Assessment: No/denies pain    Home Living Family/patient expects to be discharged to:: Private residence Living Arrangements: Spouse/significant other Available Help at Discharge: Family;Available 24 hours/day Type of Home:  House Home Access: Stairs to enter Entrance Stairs-Rails: Right Entrance  Stairs-Number of Steps: 3 garage Home Layout: Two level;Able to live on main level with bedroom/bathroom Home Equipment: Shower seat - built in;Hand held Tourist information centre manager - 2 wheels;Cane - single point;Cane - quad;Wheelchair - [employer] - 4 wheels (Sliding) Additional Comments: Wife is a retired acute PT    Prior Function Level of Independence: Independent         Comments: Performing ADLs, light IADLs, and driving . doesnt use DME     Hand Dominance   Dominant Hand: Right    Extremity/Trunk Assessment   Upper Extremity Assessment Upper Extremity Assessment: Defer to OT evaluation    Lower Extremity Assessment Lower Extremity Assessment: LLE deficits/detail (noted bil ankle lateral instability and intermittent L knee instability with ambulating) LLE Deficits / Details: MMT scores of 4+ to 5 on L and R LLE Sensation: WNL;history of peripheral neuropathy LLE Coordination: decreased fine motor;decreased gross motor (dysdiadochokinesia noted, but no dysmetria noted)    Cervical / Trunk Assessment Cervical / Trunk Assessment: Normal  Communication   Communication: No difficulties  Cognition Arousal/Alertness: Awake/alert Behavior During Therapy: WFL for tasks assessed/performed Overall Cognitive Status: History of cognitive impairments - at baseline                                 General Comments: Per wife, he is entering moderate stage of dementia      General Comments General comments (skin integrity, edema, etc.): BP reading on monitor prior to session 130/64; BP once ended gait bout 86/48 and 81/52 trying on each arm. pt admitted to lightheadedness; reclined pt and notified RN who stopped his BP meds and BP increased to 99/53 with improved symptoms    Exercises     Assessment/Plan    PT Assessment Patient needs continued PT services  PT Problem List Decreased activity tolerance;Decreased balance;Decreased mobility;Decreased  coordination;Decreased knowledge of use of DME;Decreased safety awareness;Decreased knowledge of precautions       PT Treatment Interventions DME instruction;Gait training;Stair training;Functional mobility training;Therapeutic activities;Therapeutic exercise;Balance training;Neuromuscular re-education;Cognitive remediation;Patient/family education    PT Goals (Current goals can be found in the Care Plan section)  Acute Rehab PT Goals Patient Stated Goal: go home PT Goal Formulation: With patient/family Time For Goal Achievement: 01/03/21 Potential to Achieve Goals: Good    Frequency Min 4X/week   Barriers to discharge        Co-evaluation               AM-PAC PT "6 Clicks" Mobility  Outcome Measure Help needed turning from your back to your side while in a flat bed without using bedrails?: A Little Help needed moving from lying on your back to sitting on the side of a flat bed without using bedrails?: A Little Help needed moving to and from a bed to a chair (including a wheelchair)?: A Little Help needed standing up from a chair using your arms (e.g., wheelchair or bedside chair)?: A Little Help needed to walk in hospital room?: A Little Help needed climbing 3-5 steps with a railing? : A Little 6 Click Score: 18    End of Session Equipment Utilized During Treatment: Gait belt Activity Tolerance: Patient tolerated treatment well Patient left: in chair;with call bell/phone within reach;with chair alarm set;with nursing/sitter in room;with family/visitor present Nurse Communication: Mobility status;Other (comment) (BP and symptoms) PT Visit Diagnosis: Unsteadiness on feet (R26.81);Other abnormalities of  gait and mobility (R26.89);History of falling (Z91.81);Difficulty in walking, not elsewhere classified (R26.2);Other symptoms and signs involving the nervous system (R29.898)    Time: 3085-6943 PT Time Calculation (min) (ACUTE ONLY): 27 min   Charges:   PT  Evaluation $PT Eval Moderate Complexity: 1 Mod PT Treatments $Gait Training: 8-22 mins        Moishe Spice, PT, DPT Acute Rehabilitation Services  Pager: [phone] Office: [place] 12/20/2020, 11:16 AM
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
## 2020-12-20 NOTE — Progress Notes (Signed)
Referring Physician(s): Code Stroke  Supervising Physician: Pedro Earls  Patient Status:  [facility] - In-pt  Chief Complaint: Code Stroke Left sided weakness, left facial droop  Subjective: Patient resting comfortably in bed.  Wife at bedside this AM.  Reports he is back to baseline.  Still with blurry vision which was present at baseline. Reports he has glasses at home from his opthamalogist to strengthen his eyes.   Allergies: Zetia [ezetimibe]  Medications: Prior to Admission medications   Medication Sig Start Date End Date Taking? Authorizing Provider  amitriptyline (ELAVIL) 25 MG tablet TAKE 1 TABLET(25 MG) BY MOUTH AT BEDTIME Patient taking differently: Take 25 mg by mouth at bedtime. 07/13/20  Yes Ezzard Standing, PA-C  aspirin EC 81 MG tablet Take 1 tablet (81 mg total) by mouth daily. Swallow whole. 08/14/20  Yes Martinique, Peter M, MD  busPIRone (BUSPAR) 5 MG tablet Take 5 mg by mouth 2 (two) times daily as needed (anxiety).  05/23/19  Yes [provider]  donepezil (ARICEPT) 5 MG tablet Take 5 mg by mouth at bedtime.   Yes [provider]  Evolocumab (REPATHA SURECLICK) 638 MG/ML SOAJ Inject 140 mg into the skin every 14 (fourteen) days. 06/13/20  Yes Martinique, Peter M, MD  fludrocortisone (FLORINEF) 0.1 MG tablet Take 0.1 mg by mouth daily. 11/29/18  Yes [provider]  gabapentin (NEURONTIN) 300 MG capsule TAKE 3 CAPSULES(900 MG) BY MOUTH THREE TIMES DAILY Patient taking differently: Take 900 mg by mouth 3 (three) times daily. 09/13/20  Yes Gorsuch, Ni, MD  indomethacin (INDOCIN) 25 MG capsule Take 25 mg by mouth 2 (two) times daily with a meal.    Yes [provider]  levothyroxine (SYNTHROID, LEVOTHROID) 100 MCG tablet TAKE ONE TABLET BY MOUTH ONCE DAILY BEFORE  BREAKFAST Patient taking differently: Take 100 mcg by mouth daily before breakfast.   Yes Gorsuch, Ni, MD  LORazepam (ATIVAN) 1 MG tablet TAKE 1 TABLET BY MOUTH  THREE TIMES DAILY Patient taking differently: Take 1 mg by mouth daily. 08/18/20  Yes Laurine Blazer B, PA-C  pilocarpine (SALAGEN) 7.5 MG tablet Take 7.5 mg by mouth 3 (three) times daily.   Yes [provider]  rosuvastatin (CRESTOR) 20 MG tablet TAKE 1 TABLET BY MOUTH DAILY Patient taking differently: Take 20 mg by mouth daily. 05/03/20  Yes Martin[place], Peter M, MD  traMADol (ULTRAM) 50 MG tablet Take 50 mg by mouth 2 (two) times daily as needed for moderate pain.    Yes [provider]     Vital Signs: BP 133/75   Pulse 85   Temp 99 F (37.2 C) (Oral)   Resp 17   Ht 6\' 1"  (1.854 m)   Wt 177 lb (80.3 kg)   SpO2 97%   BMI 23.35 kg/m   Physical Exam  NAD, alert Neuro: alert, oriented.  Follows commands.  Moving upper and lower extremities spontaneously.  Groin: procedure site stable.  Non-tender.  No evidence of hematoma or pseudoaneurysm.   Imaging: CT Angio Head W or Wo Contrast  Result Date: 12/19/2020 CLINICAL DATA:  Worsening weakness and dizzy spells for 1 month. Small acute right MCA infarcts on MRI with evidence of right ICA occlusion. EXAM: CT ANGIOGRAPHY HEAD AND NECK TECHNIQUE: Multidetector CT imaging of the head and neck was performed using the standard protocol during bolus administration of intravenous contrast. Multiplanar CT image reconstructions and MIPs were obtained to evaluate the vascular anatomy. Carotid stenosis measurements (when applicable)  are obtained utilizing NASCET criteria, using the distal internal carotid diameter as the denominator. CONTRAST:  137mL OMNIPAQUE IOHEXOL 350 MG/ML SOLN COMPARISON:  Head MRI 12/19/2020 FINDINGS: CTA NECK FINDINGS Aortic arch: Standard 3 vessel aortic arch with mild-to-moderate calcified plaque. No evidence of significant arch vessel origin stenosis. Right carotid system: The common carotid artery is occluded at its origin without common or internal carotid artery reconstitution in the neck. There is  reconstitution of ECA branches. Left carotid system: Patent with mild calcified plaque at the carotid bifurcation. No evidence of significant stenosis or dissection. Vertebral arteries: Calcified plaque results in mild stenosis of the proximal right subclavian artery. The vertebral arteries are patent with the left being mildly dominant. There is mild stenosis of both vertebral artery origins due to calcified plaque. Skeleton: Moderate cervical disc and facet degeneration. Other neck: No evidence of cervical lymphadenopathy or mass. Upper chest: Scarring/potential post radiation fibrosis in the lung apices. Coronary atherosclerosis. Review of the MIP images confirms the above findings CTA HEAD FINDINGS Anterior circulation: There is partial reconstitution of the petrous segment of the right ICA, however the cavernous segment appears largely occluded. There is more robust reconstitution of the right supraclinoid ICA. The intracranial left ICA is patent with mild calcified plaque not resulting in significant stenosis. ACAs and MCAs are patent without evidence of a proximal branch occlusion or significant proximal stenosis. There is slight diffuse asymmetric attenuation of the right MCA compared to the left. No aneurysm is identified. Posterior circulation: The intracranial vertebral arteries are patent with to the basilar at most mild atherosclerotic narrowing bilaterally. Patent PICA and SCA origins are identified bilaterally. The basilar artery is widely patent. There is a small right posterior communicating artery. Both PCAs are patent with mild atherosclerotic irregularity but no evidence of a significant proximal stenosis. No aneurysm is identified. Venous sinuses: Patent. Anatomic variants: None. Review of the MIP images confirms the above findings IMPRESSION: 1. 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 2. Mild bilateral vertebral artery origin  stenoses. 3. Left-sided carotid atherosclerosis without significant stenosis. 4. Aortic Atherosclerosis (ICD10-I70.0). Electronically Signed   By: Logan Bores M.D.   On: 12/19/2020 16:31   CT HEAD WO CONTRAST  Result Date: 12/20/2020 CLINICAL DATA:  Right carotid and MCA thrombectomy. Right MCA territory CVA. EXAM: CT HEAD WITHOUT CONTRAST TECHNIQUE: Contiguous axial images were obtained from the base of the skull through the vertex without intravenous contrast. COMPARISON:  12/19/2020 FINDINGS: Brain: Normal anatomic configuration. Parenchymal volume loss is commensurate with the patient's age. Mild periventricular white matter changes are present likely reflecting the sequela of small vessel ischemia. Remote left occipital cortical infarct is unchanged. No abnormal intra or extra-axial mass lesion or fluid collection. No abnormal mass effect or midline shift. No evidence of acute intracranial hemorrhage or infarct. Ventricular size is normal. Cerebellum unremarkable. Vascular: No asymmetric hyperdense vasculature at the skull base. Skull: Intact Sinuses/Orbits: Paranasal sinuses are clear. Orbits are unremarkable. Other: Mastoid air cells and middle ear cavities are clear. IMPRESSION: No evidence of acute intracranial hemorrhage or infarct. Known tiny punctate cortical infarcts within the right temporoparietal regions on a prior MRI examination are not well appreciated on this exam. Electronically Signed   By: Fidela Salisbury MD   On: 12/20/2020 01:50   CT Head Wo Contrast  Result Date: 12/19/2020 CLINICAL DATA:  Motor neuron disease. Additional history provided: Patient reports worsening weakness and dizzy spells for 1 month, symptoms  worsening this week, fall 2 days ago. EXAM: CT HEAD WITHOUT CONTRAST TECHNIQUE: Contiguous axial images were obtained from the base of the skull through the vertex without intravenous contrast. COMPARISON:  Brain MRI 06/14/2017. FINDINGS: Brain: Mild cerebral and cerebellar  atrophy. A small cortically based infarct within the left occipital lobe (PCA vascular territory) is new as compared to the brain MRI of 06/14/2017, but otherwise age-indeterminate. There is no acute intracranial hemorrhage. No extra-axial fluid collection. No evidence of intracranial mass. No midline shift. Vascular: No hyperdense vessel.  Atherosclerotic calcifications. Skull: Normal. Negative for fracture or focal lesion. Sinuses/Orbits: Visualized orbits show no acute finding. Small mucous retention cyst within the inferior right frontal sinus. IMPRESSION: A small cortically based infarct within the left occipital lobe (PCA vascular territory) is new from the brain MRI of 06/14/2017, but otherwise age-indeterminate. Correlate with findings on the pending brain MRI. Stable mild generalized parenchymal atrophy. Small right frontal sinus mucous retention cyst. Electronically Signed   By: Kellie Simmering DO   On: 12/19/2020 14:18   CT Angio Neck W and/or Wo Contrast  Result Date: 12/19/2020 CLINICAL DATA:  Worsening weakness and dizzy spells for 1 month. Small acute right MCA infarcts on MRI with evidence of right ICA occlusion. EXAM: CT ANGIOGRAPHY HEAD AND NECK TECHNIQUE: Multidetector CT imaging of the head and neck was performed using the standard protocol during bolus administration of intravenous contrast. Multiplanar CT image reconstructions and MIPs were obtained to evaluate the vascular anatomy. Carotid stenosis measurements (when applicable) are obtained utilizing NASCET criteria, using the distal internal carotid diameter as the denominator. CONTRAST:  137mL OMNIPAQUE IOHEXOL 350 MG/ML SOLN COMPARISON:  Head MRI 12/19/2020 FINDINGS: CTA NECK FINDINGS Aortic arch: Standard 3 vessel aortic arch with mild-to-moderate calcified plaque. No evidence of significant arch vessel origin stenosis. Right carotid system: The common carotid artery is occluded at its origin without common or internal carotid artery  reconstitution in the neck. There is reconstitution of ECA branches. Left carotid system: Patent with mild calcified plaque at the carotid bifurcation. No evidence of significant stenosis or dissection. Vertebral arteries: Calcified plaque results in mild stenosis of the proximal right subclavian artery. The vertebral arteries are patent with the left being mildly dominant. There is mild stenosis of both vertebral artery origins due to calcified plaque. Skeleton: Moderate cervical disc and facet degeneration. Other neck: No evidence of cervical lymphadenopathy or mass. Upper chest: Scarring/potential post radiation fibrosis in the lung apices. Coronary atherosclerosis. Review of the MIP images confirms the above findings CTA HEAD FINDINGS Anterior circulation: There is partial reconstitution of the petrous segment of the right ICA, however the cavernous segment appears largely occluded. There is more robust reconstitution of the right supraclinoid ICA. The intracranial left ICA is patent with mild calcified plaque not resulting in significant stenosis. ACAs and MCAs are patent without evidence of a proximal branch occlusion or significant proximal stenosis. There is slight diffuse asymmetric attenuation of the right MCA compared to the left. No aneurysm is identified. Posterior circulation: The intracranial vertebral arteries are patent with to the basilar at most mild atherosclerotic narrowing bilaterally. Patent PICA and SCA origins are identified bilaterally. The basilar artery is widely patent. There is a small right posterior communicating artery. Both PCAs are patent with mild atherosclerotic irregularity but no evidence of a significant proximal stenosis. No aneurysm is identified. Venous sinuses: Patent. Anatomic variants: None. Review of the MIP images confirms the above findings IMPRESSION: 1. Occlusion of the right common carotid artery  and right cervical ICA with distal intracranial reconstitution. 2.  Mild bilateral vertebral artery origin stenoses. 3. Left-sided carotid atherosclerosis without significant stenosis. 4. Aortic Atherosclerosis (ICD10-I70.0). Electronically Signed   By: Logan Bores M.D.   On: 12/19/2020 16:31   MR BRAIN WO CONTRAST  Result Date: 12/20/2020 CLINICAL DATA:  80 year old male found to have right carotid occlusion, two small acute right MCA cortical infarcts on MRI yesterday. And repeat neck CTA yesterday demonstrating subsequent acute large vessel occlusion at the right MCA bifurcation. Then status post endovascular thrombectomy. EXAM: MRI HEAD WITHOUT CONTRAST TECHNIQUE: Multiplanar, multiecho pulse sequences of the brain and surrounding structures were obtained without intravenous contrast. COMPARISON:  Brain MRI 1418 hours yesterday, along with CTA head and neck x2 yesterday. FINDINGS: Brain: There remain only a few small cortical and subcortical white matter infarcts scattered in the right MCA territory, such as on series 2, image 40. About six such areas are present now, versus the two seen yesterday. No contralateral left hemisphere or posterior fossa restricted diffusion. Minor cytotoxic edema in the affected areas with no acute hemorrhage. No mass effect. A few scattered chronic microhemorrhages in the brain, mostly the left hemisphere, are stable since yesterday. Stable pre-existing scattered white matter T2 and FLAIR hyperintensity, and a small area of chronic encephalomalacia in the left occipital pole. Stable small chronic left cerebellar infarct near midline. No midline shift, mass effect, evidence of mass lesion, ventriculomegaly, extra-axial collection or acute intracranial hemorrhage. Cervicomedullary junction and pituitary are within normal limits. Vascular: Major intracranial vascular flow voids are now normalized, including the right ICA siphon. Skull and upper cervical spine: Stable and negative for age. Sinuses/Orbits: Stable and negative. Other: Trace mastoid  fluid is stable. Negative visible scalp and face. IMPRESSION: 1. Following right carotid and MCA revascularization there are only a few scattered small cortical and subcortical white matter infarcts in the right MCA territory, in addition to the two seen by MRI yesterday. No hemorrhage or mass effect. 2. Small chronic left PCA territory infarct, small left cerebellar infarct. Mild for age white matter signal changes. Electronically Signed   By: Genevie Ann M.D.   On: 12/20/2020 06:59   MR BRAIN WO CONTRAST  Result Date: 12/19/2020 CLINICAL DATA:  Worsening ataxia over the past few weeks. EXAM: MRI HEAD WITHOUT CONTRAST TECHNIQUE: Multiplanar, multiecho pulse sequences of the brain and surrounding structures were obtained without intravenous contrast. COMPARISON:  Head CT 12/19/2020 and MRI 06/14/2017 FINDINGS: Brain: There are subcentimeter acute cortical infarcts at the right temporoparietal junction as well as more superiorly in the right parietal lobe. The small left occipital infarct seen on CT is chronic. There is also a small chronic cortical infarct anterolaterally in the right frontal lobe. There is a small chronic infarct medially in the left cerebellar hemisphere. Scattered small foci of T2 hyperintensity in the cerebral white matter and pons have slightly progressed from the prior MRI and are nonspecific but compatible with mild chronic small vessel ischemic disease. There is mild generalized cerebral atrophy. Scattered chronic microhemorrhages in both cerebral hemispheres have slightly increased in number from the prior MRI. No mass, midline shift, or extra-axial fluid collection is identified. Vascular: Loss of the normal flow void of the distal cervical and proximal intracranial portions of the right internal carotid artery with reconstitution of the supraclinoid ICA flow void. Skull and upper cervical spine: Unremarkable bone marrow signal. Sinuses/Orbits: Bilateral cataract extraction. Minimal  mucosal thickening in the paranasal sinuses. Small right mastoid effusion. Other: None.  IMPRESSION: 1. Two small acute cortical infarcts in the right MCA territory. 2. Multiple small chronic cerebral and cerebellar infarcts as above. 3. Absent flow void in the distal cervical and proximal intracranial right internal carotid consistent with occlusion, new from 2018. Electronically Signed   By: Logan Bores M.D.   On: 12/19/2020 15:06   CT HEAD CODE STROKE WO CONTRAST  Result Date: 12/19/2020 CLINICAL DATA:  Code stroke.  Left-sided facial droop. EXAM: CT HEAD WITHOUT CONTRAST TECHNIQUE: Contiguous axial images were obtained from the base of the skull through the vertex without intravenous contrast. COMPARISON:  Head CT, CTA, and MRI 12/19/2020 FINDINGS: Brain: No acute large territory infarct, intracranial hemorrhage, mass, midline shift, or extra-axial fluid collection is identified. The 2 subcentimeter acute right MCA cortical infarcts on MRI are not well shown by CT. There is mild cerebral atrophy. Hypodensities in the cerebral white matter bilaterally are unchanged and nonspecific but compatible with mild chronic small vessel ischemic disease. Small chronic infarcts are again noted in the right frontal lobe and left occipital lobe. Vascular: Limited assessment due to residual intravascular contrast from today's earlier CTA. Skull: No fracture or suspicious osseous lesion. Sinuses/Orbits: Small mucous retention cyst in the right frontal sinus. Small right mastoid effusion. Bilateral cataract extraction. Other: None. ASPECTS [facility] Stroke Program Early CT Score) - Ganglionic level infarction (caudate, lentiform nuclei, internal capsule, insula, M1-M3 cortex): 7 - Supraganglionic infarction (M4-M6 cortex): 3 Total score (0-10 with 10 being normal): 10 IMPRESSION: 1. No evidence of acute large territory infarct or intracranial hemorrhage. Subcentimeter acute right MCA infarcts on MRI are occult by CT. 2.  ASPECTS is 10. These results were communicated to Dr. Lorrin Goodell at 6:24 pm on 12/19/2020 by text page via the [facility] messaging system. Electronically Signed   By: Logan Bores M.D.   On: 12/19/2020 18:24   CT ANGIO HEAD CODE STROKE  Result Date: 12/19/2020 CLINICAL DATA:  Left facial droop. EXAM: CT ANGIOGRAPHY HEAD AND NECK TECHNIQUE: Multidetector CT imaging of the head and neck was performed using the standard protocol during bolus administration of intravenous contrast. Multiplanar CT image reconstructions and MIPs were obtained to evaluate the vascular anatomy. Carotid stenosis measurements (when applicable) are obtained utilizing NASCET criteria, using the distal internal carotid diameter as the denominator. CONTRAST:  48mL OMNIPAQUE IOHEXOL 350 MG/ML SOLN COMPARISON:  CTA head and neck earlier today FINDINGS: CTA NECK FINDINGS Aortic arch: Standard 3 vessel aortic arch with mild-to-moderate calcified plaque. No evidence of significant arch vessel origin stenosis. 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 Left carotid system: Patent with mild calcified plaque in the mid common carotid artery and at the carotid bifurcation. No evidence of significant stenosis or dissection. Vertebral arteries: Calcified plaque results in unchanged mild stenosis of the proximal right subclavian artery. The vertebral arteries remain patent with unchanged mild stenosis of both origins due to calcified plaque. The left vertebral artery is mildly dominant. Skeleton: Moderate cervical disc and facet degeneration. Other neck: No evidence of cervical lymphadenopathy or mass. Post radiation changes in the neck. Upper chest: Scarring or post radiation fibrosis in the lung apices. Review of the MIP images confirms the above findings CTA HEAD FINDINGS Anterior circulation: The intracranial right ICA is occluded  proximally with reconstitution of the ophthalmic segment through the terminus. There is an early bifurcation of the right MCA with a new filling defect at the  bifurcation consistent with an embolus. The origin of the right M2 superior division is narrowed but remains patent. The right M2 inferior division is occluded over approximately 6 mm long segment beginning at the MCA bifurcation with subsequent reconstitution. There is also occlusion of some small right MCA branches more distally compared to today's earlier CTA. The left MCA and both ACAs remain patent with branch vessel irregularity but no evidence of a significant proximal stenosis. No aneurysm is identified. Posterior circulation: The intracranial vertebral arteries are patent to the basilar with atherosclerosis resulting in mild luminal irregularity and at most mild narrowing bilaterally. Patent PICA and SCA origins are identified bilaterally. The basilar artery is widely patent. There is a small right posterior communicating artery. Both PCAs are patent with mild atherosclerotic irregularity but no evidence of a significant proximal stenosis. No aneurysm is identified. Venous sinuses: Patent. Anatomic variants: None. Review of the MIP images confirms the above findings IMPRESSION: 1. New embolus at the right MCA bifurcation with short segment occlusion of the M2 inferior division, narrowing of the origin of the M2 superior division, and occlusion of some more distal MCA branch vessels. 2. 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 3. Mild bilateral vertebral artery origin stenoses. 4. Left-sided carotid atherosclerosis without significant stenosis. 5.  Aortic Atherosclerosis (ICD10-I70.0). These results were communicated to Dr. Lorrin Goodell at 6:24 pm on 12/19/2020 by text page via the [facility] messaging system. Electronically Signed   By: Logan Bores M.D.   On:  12/19/2020 18:38   CT ANGIO NECK CODE STROKE  Result Date: 12/19/2020 CLINICAL DATA:  Left facial droop. EXAM: CT ANGIOGRAPHY HEAD AND NECK TECHNIQUE: Multidetector CT imaging of the head and neck was performed using the standard protocol during bolus administration of intravenous contrast. Multiplanar CT image reconstructions and MIPs were obtained to evaluate the vascular anatomy. Carotid stenosis measurements (when applicable) are obtained utilizing NASCET criteria, using the distal internal carotid diameter as the denominator. CONTRAST:  43mL OMNIPAQUE IOHEXOL 350 MG/ML SOLN COMPARISON:  CTA head and neck earlier today FINDINGS: CTA NECK FINDINGS Aortic arch: Standard 3 vessel aortic arch with mild-to-moderate calcified plaque. No evidence of significant arch vessel origin stenosis. 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 Left carotid system: Patent with mild calcified plaque in the mid common carotid artery and at the carotid bifurcation. No evidence of significant stenosis or dissection. Vertebral arteries: Calcified plaque results in unchanged mild stenosis of the proximal right subclavian artery. The vertebral arteries remain patent with unchanged mild stenosis of both origins due to calcified plaque. The left vertebral artery is mildly dominant. Skeleton: Moderate cervical disc and facet degeneration. Other neck: No evidence of cervical lymphadenopathy or mass. Post radiation changes in the neck. Upper chest: Scarring or post radiation fibrosis in the lung apices. 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 There is an early bifurcation of the right MCA with a new filling defect at the bifurcation consistent with an  embolus. The origin of the right M2 superior division is narrowed but remains patent. The right M2 inferior division is occluded over approximately 6 mm long segment beginning at the MCA bifurcation with subsequent reconstitution. There is also occlusion  of some small right MCA branches more distally compared to today's earlier CTA. The left MCA and both ACAs remain patent with branch vessel irregularity but no evidence of a significant proximal stenosis. No aneurysm is identified. Posterior circulation: The intracranial vertebral arteries are patent to the basilar with atherosclerosis resulting in mild luminal irregularity and at most mild narrowing bilaterally. Patent PICA and SCA origins are identified bilaterally. The basilar artery is widely patent. There is a small right posterior communicating artery. Both PCAs are patent with mild atherosclerotic irregularity but no evidence of a significant proximal stenosis. No aneurysm is identified. Venous sinuses: Patent. Anatomic variants: None. Review of the MIP images confirms the above findings IMPRESSION: 1. New embolus at the right MCA bifurcation with short segment occlusion of the M2 inferior division, narrowing of the origin of the M2 superior division, and occlusion of some more distal MCA branch vessels. 2. 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 3. Mild bilateral vertebral artery origin stenoses. 4. Left-sided carotid atherosclerosis without significant stenosis. 5.  Aortic Atherosclerosis (ICD10-I70.0). These results were communicated to Dr. Lorrin Goodell at 6:24 pm on 12/19/2020 by text page via the [facility] messaging system. Electronically Signed   By: Logan Bores M.D.   On: 12/19/2020 18:38    Labs:  CBC: Recent Labs    06/21/20 0744 12/19/20 1201 12/19/20 2314 12/20/20 0240  WBC 9.3 12.3* 12.5* 13.6*  HGB 13.4 14.1 14.5 14.4  HCT 42.4 42.8 43.7  43.7  PLT 135* 152 147* 138*    COAGS: Recent Labs    12/19/20 2314  INR 1.0  APTT 33    BMP: Recent Labs    03/22/20 1740 04/11/20 1415 06/21/20 0744 08/14/20 1431 12/19/20 1201 12/19/20 2314 12/20/20 0240  NA 138 140 140 141 137  --  138  K 3.9 4.4 4.6 4.4 4.4  --  4.3  CL 100 105 103 102 101  --  107  CO2 29 27 27 26 27   --  27  GLUCOSE 103* 102* 102* 84 96  --  107*  BUN 22 21 28* 19 23  --  16  CALCIUM 9.2 9.7 9.1 9.3 8.7*  --  8.2*  CREATININE 1.06 1.21 1.64* 1.28* 1.19 1.04 1.03  GFRNONAA >60 57* 39* 53* >60 >60 >60  GFRAA >60 >60 45* 61  --   --   --     LIVER FUNCTION TESTS: Recent Labs    04/11/20 1415 06/21/20 0744 12/19/20 1201 12/20/20 0240  BILITOT 0.3 0.7 0.5 0.6  AST 19 27 27 27   ALT 12 15 44 37  ALKPHOS 37* 33* 31* 27*  PROT 7.4 7.1 7.3 6.4*  ALBUMIN 3.9 4.0 4.0 3.4*    Assessment and Plan: 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  Reocclusion of the proximal right M1/MCA s/p thrombectomy with complete TICI3 recanalization. Patient assessed this AM by PA and again this afternoon alongside Dr. Karenann Cai.   Reports he is back to baseline and does not noticed any residual weakness.  Procedure site intact without issue.  Patient should continue Brilinta 90mg  BID and aspirin 81mg  daily.  Plan for 3 month follow-up with carotid dopplers obtained prior to visit. Patient aware.  Care instructions given.  IR available as needed.   Electronically Signed: Docia Barrier, PA 12/20/2020, 2:48 PM   I spent a  total of 15 Minutes at the the patient's bedside AND on the patient's hospital floor or unit, greater than 50% of which was counseling/coordinating care for right common carotid artery occlusion.

## 2020-12-20 NOTE — Anesthesia Postprocedure Evaluation (Signed)
Anesthesia Post Note  Patient: George Bradley  Procedure(s) Performed: IR WITH ANESTHESIA (N/A )     Patient location during evaluation: PACU Anesthesia Type: General Level of consciousness: awake Pain management: pain level controlled Vital Signs Assessment: post-procedure vital signs reviewed and stable Respiratory status: spontaneous breathing Cardiovascular status: stable Postop Assessment: no apparent nausea or vomiting Anesthetic complications: no   No complications documented.  Last Vitals:  Vitals:   12/19/20 2345 12/20/20 0000  BP:  (!) 116/40  Pulse: (!) 57 (!) 57  Resp: 18 13  Temp:  36.6 C  SpO2: 98%     Last Pain:  Vitals:   12/20/20 0000  TempSrc: Oral  PainSc: 0-No pain                 Icholas Irby

## 2020-12-20 NOTE — Progress Notes (Addendum)
Spoke with Dr. Leonel Ramsay earlier in the night to clarify CT and MRI orders. MRI tech was concerned about the need for an MRI tonight due to patient having one earlier this evening. Dr. Leonel Ramsay said to get the CT after the Cangrelor infusion was completed and the MRI early morning. Through the usual protocol, Cangrelor was shut off after infusion was completed and patient was taken to CT. Pharmacy called and notified it was ordered as a continuous infusion and the new bag needed to be picked up at the pharmacy, Dr. Leonel Ramsay was paged to clarify the order and the Brilinta loading dose was ordered and given.

## 2020-12-20 NOTE — Evaluation (Signed)
Occupational Therapy Evaluation Patient Details Name: George Bradley MRN: [id number] DOB: 1941/09/20 Today's Date: 12/20/2020    History of Present Illness 80 yo male presenting with left sided weakness and left facial droop. 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 Neurology was consulted and requested CT angio head and neck with immediate transfer to [facility]. 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 PMH including aortic atherosclerosis, bilateral renal artery stenosis, CKD, CAD, gastroesophageal reflux disease, hyperlipidemia, chronic orthostatic HTN, and BPH.   Clinical Impression   PTA, pt was living with his wife (who is a retired acute PT) and was independent with ADLs, light IADLs, and driving. Pt performing ADLs and functional mobility at [place] A level. Pt presenting with decreased balance; feels more comfortable with RW for mobility. Pt would benefit from further acute OT to facilitate safe dc. Recommend dc to home once medically stable per physician.      Follow Up Recommendations  No OT follow up;Supervision/Assistance - 24 hour (Initial 24/7 supervision; OP PT)    Equipment Recommendations  None recommended by OT    Recommendations for Other Services PT consult     Precautions / Restrictions Precautions Precautions: Fall      Mobility Bed Mobility Overal bed mobility: Needs Assistance Bed Mobility: Supine to Sit     Supine to sit: Supervision     General bed mobility comments: Supervision for safety    Transfers Overall transfer level: Needs assistance   Transfers: Sit to/from Stand Sit to Stand: Min guard         General transfer comment: Min Guard A for safety    Balance Overall balance assessment: Mild deficits observed, not formally tested;Needs assistance Sitting-balance support: No upper extremity supported;Feet  supported Sitting balance-Leahy Scale: Good     Standing balance support: No upper extremity supported;During functional activity Standing balance-Leahy Scale: Good Standing balance comment: Pt moving in room without UE support. Feels more comfortable with UE at walker                           ADL either performed or assessed with clinical judgement   ADL Overall ADL's : Needs assistance/impaired Eating/Feeding: Set up;Sitting   Grooming: Oral care;Min guard;Supervision/safety;Standing   Upper Body Bathing: Set up;Supervision/ safety;Sitting   Lower Body Bathing: Min guard;Sit to/from stand   Upper Body Dressing : Set up;Supervision/safety;Sitting   Lower Body Dressing: Min guard;Minimal assistance;Sit to/from stand   Toilet Transfer: Min guard;Ambulation Toilet Transfer Details (indicate cue type and reason): Min Guard A for safety         Functional mobility during ADLs: Min guard;Rolling walker;Minimal assistance General ADL Comments: Pt performing near baseline function. Slight balance deficits compared to his normal. Pt very motivated and agreeable to therapy.     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     Perception     Praxis      Pertinent Vitals/Pain Pain Assessment: No/denies pain     Hand Dominance Right   Extremity/Trunk Assessment Upper Extremity Assessment Upper Extremity Assessment: Overall WFL for tasks assessed   Lower Extremity Assessment Lower Extremity Assessment:  Defer to PT evaluation   Cervical / Trunk Assessment Cervical / Trunk Assessment: Normal   Communication Communication Communication: No difficulties    Cognition Arousal/Alertness: Awake/alert Behavior During Therapy: WFL for tasks assessed/performed Overall Cognitive Status: History of cognitive impairments - at baseline                                 General Comments: Per wife, he is entering moderate stage of dementia   General Comments  Wife present throughout    Exercises     Shoulder Instructions      Home Living Family/patient expects to be discharged to:: Private residence Living Arrangements: Spouse/significant other Available Help at Discharge: Family;Available 24 hours/day Type of Home: House Home Access: Stairs to enter [employer] of Steps: 3 garage Entrance Stairs-Rails: Right Home Layout: Two level;Able to live on main level with bedroom/bathroom Alternate Level Stairs-Number of Steps: Flight   Bathroom Shower/Tub: Occupational psychologist: Handicapped height     Home Equipment: Civil engineer, contracting - built in;Hand held Tourist information centre manager - 2 wheels;Cane - single point;Cane - quad;Wheelchair - manual (Sliding)   Additional Comments: Wife is a retired acute PT      Prior Functioning/Environment Level of Independence: Independent        Comments: Performing ADLs, light IADLs, and driving . doesnt use DME        OT Problem List: Decreased strength;Decreased range of motion;Decreased activity tolerance;Impaired balance (sitting and/or standing);Impaired vision/perception;Decreased coordination;Decreased safety awareness;Decreased cognition;Decreased knowledge of use of DME or AE;Decreased knowledge of precautions      OT Treatment/Interventions: Self-care/ADL training;Therapeutic exercise;Energy conservation;DME and/or AE instruction;Therapeutic activities;Patient/family education    OT Goals(Current goals can be found in the care plan section) Acute Rehab OT Goals Patient Stated Goal: go home OT Goal Formulation: With patient/family Time For Goal Achievement:  01/03/21 Potential to Achieve Goals: Good ADL Goals Pt Will Perform Grooming: with modified independence;standing Pt Will Perform Lower Body Dressing: with modified independence;sit to/from stand Pt Will Transfer to Toilet: with modified independence;regular height toilet;ambulating Pt Will Perform Toileting - Clothing Manipulation and hygiene: with modified independence;sitting/lateral leans;sit to/from stand  OT Frequency: Min 2X/week   Barriers to D/C:            Co-evaluation              AM-PAC OT "6 Clicks" Daily Activity     Outcome Measure Help from another person eating meals?: None Help from another person taking care of personal grooming?: A Little Help from another person toileting, which includes using toliet, bedpan, or urinal?: A Little Help from another person bathing (including washing, rinsing, drying)?: A Little Help from another person to put on and taking off regular upper body clothing?: A Little Help from another person to put on and taking off regular lower body clothing?: A Little 6 Click Score: 19   End of Session Equipment Utilized During Treatment: Gait belt Nurse Communication: Mobility status  Activity Tolerance: Patient tolerated treatment well Patient left:  (in hallway with PT)  OT Visit Diagnosis: Unsteadiness on feet (R26.81);Other abnormalities of gait and mobility (R26.89);Muscle weakness (generalized) (M62.81)                Time: 6712-4580 OT Time Calculation (min): 17 min Charges:  OT General Charges $OT Visit: 1 Visit OT Evaluation $OT Eval Moderate Complexity: [place], OTR/L Acute Rehab Pager: [phone]  Office: [place] 12/20/2020, 10:04 AM

## 2020-12-20 NOTE — Progress Notes (Addendum)
STROKE TEAM PROGRESS NOTE   INTERVAL HISTORY His wife is at the bedside.  When asked what happened yesterday he reports that he does not remember. His wife reports that for over a year now he has had trouble walking due to orthostatic hypotension. Sometimes when walking he would have to stop and have to hold on top something to keep himself upright. She reports that a few days ago his symptoms got worse and his legs began to shake when he was walking as well as become dizzy. 2 days ago they called his cardiologist and he recommended they go to the ER to be evaluated but the patient refused. Then yesterday morning he fell a few times and so relented and came to the ER. 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 He was taken by IR and underwent recanalization of the Right ICA and MCA with stent deployment at the Carotid bifurcation and thrombectomy of reocclusion of the proximal Right M1 branch of the MCA. He tolerated the procedure well and today reports no issues.   IR is following. Vitals:   12/20/20 0630 12/20/20 0645 12/20/20 0700 12/20/20 0800  BP: (!) 159/80 (!) 122/58 (!) 118/57 (!) 128/58  Pulse: 66 70 67 69  Resp: 12 15 (!) 21 18  Temp:    98.3 F (36.8 C)  TempSrc:    Oral  SpO2:    97%  Weight:      Height:       CBC:  Recent Labs  Lab 12/19/20 2314 12/20/20 0240  WBC 12.5* 13.6*  HGB 14.5 14.4  HCT 43.7 43.7  MCV 99.1 100.2*  PLT 147* 528*   Basic Metabolic Panel:  Recent Labs  Lab 12/19/20 1201 12/19/20 2314 12/20/20 0240  NA 137  --  138  K 4.4  --  4.3  CL 101  --  107  CO2 27  --  27  GLUCOSE 96  --  107*  BUN 23  --  16  CREATININE 1.19 1.04 1.03  CALCIUM 8.7*  --  8.2*   Lipid Panel:  Recent Labs  Lab 12/20/20 0240  CHOL 113  TRIG 106  HDL 62  CHOLHDL 1.8  VLDL 21  LDLCALC 30   HgbA1c:  Recent Labs  Lab 12/20/20 0240  HGBA1C 6.3*   Urine Drug Screen: No results for input(s): LABOPIA, COCAINSCRNUR, LABBENZ, AMPHETMU, THCU,  LABBARB in the last 168 hours.  Alcohol Level No results for input(s): ETH in the last 168 hours.  IMAGING past 24 hours CT Angio Head W or Wo Contrast  Result Date: 12/19/2020 CLINICAL DATA:  Worsening weakness and dizzy spells for 1 month. Small acute right MCA infarcts on MRI with evidence of right ICA occlusion. EXAM: CT ANGIOGRAPHY HEAD AND NECK TECHNIQUE: Multidetector CT imaging of the head and neck was performed using the standard protocol during bolus administration of intravenous contrast. Multiplanar CT image reconstructions and MIPs were obtained to evaluate the vascular anatomy. Carotid stenosis measurements (when applicable) are obtained utilizing NASCET criteria, using the distal internal carotid diameter as the denominator. CONTRAST:  151mL OMNIPAQUE IOHEXOL 350 MG/ML SOLN COMPARISON:  Head MRI 12/19/2020 FINDINGS: CTA NECK FINDINGS Aortic arch: Standard 3 vessel aortic arch with mild-to-moderate calcified plaque. No evidence of significant arch vessel origin stenosis. Right carotid system: The common carotid artery is occluded at its origin without common or internal carotid artery reconstitution in the neck. There is reconstitution of ECA branches. Left carotid system: Patent with mild  calcified plaque at the carotid bifurcation. No evidence of significant stenosis or dissection. Vertebral arteries: Calcified plaque results in mild stenosis of the proximal right subclavian artery. The vertebral arteries are patent with the left being mildly dominant. There is mild stenosis of both vertebral artery origins due to calcified plaque. Skeleton: Moderate cervical disc and facet degeneration. Other neck: No evidence of cervical lymphadenopathy or mass. Upper chest: Scarring/potential post radiation fibrosis in the lung apices. Coronary atherosclerosis. Review of the MIP images confirms the above findings CTA HEAD FINDINGS Anterior circulation: There is partial reconstitution of the petrous segment  of the right ICA, however the cavernous segment appears largely occluded. There is more robust reconstitution of the right supraclinoid ICA. The intracranial left ICA is patent with mild calcified plaque not resulting in significant stenosis. ACAs and MCAs are patent without evidence of a proximal branch occlusion or significant proximal stenosis. There is slight diffuse asymmetric attenuation of the right MCA compared to the left. No aneurysm is identified. Posterior circulation: The intracranial vertebral arteries are patent with to the basilar at most mild atherosclerotic narrowing bilaterally. Patent PICA and SCA origins are identified bilaterally. The basilar artery is widely patent. There is a small right posterior communicating artery. Both PCAs are patent with mild atherosclerotic irregularity but no evidence of a significant proximal stenosis. No aneurysm is identified. Venous sinuses: Patent. Anatomic variants: None. Review of the MIP images confirms the above findings IMPRESSION: 1. 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 2. Mild bilateral vertebral artery origin stenoses. 3. Left-sided carotid atherosclerosis without significant stenosis. 4. Aortic Atherosclerosis (ICD10-I70.0). Electronically Signed   By: Logan Bores M.D.   On: 12/19/2020 16:31   CT HEAD WO CONTRAST  Result Date: 12/20/2020 CLINICAL DATA:  Right carotid and MCA thrombectomy. Right MCA territory CVA. EXAM: CT HEAD WITHOUT CONTRAST TECHNIQUE: Contiguous axial images were obtained from the base of the skull through the vertex without intravenous contrast. COMPARISON:  12/19/2020 FINDINGS: Brain: Normal anatomic configuration. Parenchymal volume loss is commensurate with the patient's age. Mild periventricular white matter changes are present likely reflecting the sequela of small vessel ischemia. Remote left occipital cortical infarct is unchanged. No abnormal intra  or extra-axial mass lesion or fluid collection. No abnormal mass effect or midline shift. No evidence of acute intracranial hemorrhage or infarct. Ventricular size is normal. Cerebellum unremarkable. Vascular: No asymmetric hyperdense vasculature at the skull base. Skull: Intact Sinuses/Orbits: Paranasal sinuses are clear. Orbits are unremarkable. Other: Mastoid air cells and middle ear cavities are clear. IMPRESSION: No evidence of acute intracranial hemorrhage or infarct. Known tiny punctate cortical infarcts within the right temporoparietal regions on a prior MRI examination are not well appreciated on this exam. Electronically Signed   By: Fidela Salisbury MD   On: 12/20/2020 01:50   CT Head Wo Contrast  Result Date: 12/19/2020 CLINICAL DATA:  Motor neuron disease. Additional history provided: Patient reports worsening weakness and dizzy spells for 1 month, symptoms worsening this week, fall 2 days ago. EXAM: CT HEAD WITHOUT CONTRAST TECHNIQUE: Contiguous axial images were obtained from the base of the skull through the vertex without intravenous contrast. COMPARISON:  Brain MRI 06/14/2017. FINDINGS: Brain: Mild cerebral and cerebellar atrophy. A small cortically based infarct within the left occipital lobe (PCA vascular territory) is new as compared to the brain MRI of 06/14/2017, but otherwise age-indeterminate. There is no acute intracranial hemorrhage. No extra-axial fluid collection. No evidence of intracranial mass. No  midline shift. Vascular: No hyperdense vessel.  Atherosclerotic calcifications. Skull: Normal. Negative for fracture or focal lesion. Sinuses/Orbits: Visualized orbits show no acute finding. Small mucous retention cyst within the inferior right frontal sinus. IMPRESSION: A small cortically based infarct within the left occipital lobe (PCA vascular territory) is new from the brain MRI of 06/14/2017, but otherwise age-indeterminate. Correlate with findings on the pending brain MRI. Stable  mild generalized parenchymal atrophy. Small right frontal sinus mucous retention cyst. Electronically Signed   By: Kellie Simmering DO   On: 12/19/2020 14:18   CT Angio Neck W and/or Wo Contrast  Result Date: 12/19/2020 CLINICAL DATA:  Worsening weakness and dizzy spells for 1 month. Small acute right MCA infarcts on MRI with evidence of right ICA occlusion. EXAM: CT ANGIOGRAPHY HEAD AND NECK TECHNIQUE: Multidetector CT imaging of the head and neck was performed using the standard protocol during bolus administration of intravenous contrast. Multiplanar CT image reconstructions and MIPs were obtained to evaluate the vascular anatomy. Carotid stenosis measurements (when applicable) are obtained utilizing NASCET criteria, using the distal internal carotid diameter as the denominator. CONTRAST:  152mL OMNIPAQUE IOHEXOL 350 MG/ML SOLN COMPARISON:  Head MRI 12/19/2020 FINDINGS: CTA NECK FINDINGS Aortic arch: Standard 3 vessel aortic arch with mild-to-moderate calcified plaque. No evidence of significant arch vessel origin stenosis. Right carotid system: The common carotid artery is occluded at its origin without common or internal carotid artery reconstitution in the neck. There is reconstitution of ECA branches. Left carotid system: Patent with mild calcified plaque at the carotid bifurcation. No evidence of significant stenosis or dissection. Vertebral arteries: Calcified plaque results in mild stenosis of the proximal right subclavian artery. The vertebral arteries are patent with the left being mildly dominant. There is mild stenosis of both vertebral artery origins due to calcified plaque. Skeleton: Moderate cervical disc and facet degeneration. Other neck: No evidence of cervical lymphadenopathy or mass. Upper chest: Scarring/potential post radiation fibrosis in the lung apices. Coronary atherosclerosis. Review of the MIP images confirms the above findings CTA HEAD FINDINGS Anterior circulation: There is partial  reconstitution of the petrous segment of the right ICA, however the cavernous segment appears largely occluded. There is more robust reconstitution of the right supraclinoid ICA. The intracranial left ICA is patent with mild calcified plaque not resulting in significant stenosis. ACAs and MCAs are patent without evidence of a proximal branch occlusion or significant proximal stenosis. There is slight diffuse asymmetric attenuation of the right MCA compared to the left. No aneurysm is identified. Posterior circulation: The intracranial vertebral arteries are patent with to the basilar at most mild atherosclerotic narrowing bilaterally. Patent PICA and SCA origins are identified bilaterally. The basilar artery is widely patent. There is a small right posterior communicating artery. Both PCAs are patent with mild atherosclerotic irregularity but no evidence of a significant proximal stenosis. No aneurysm is identified. Venous sinuses: Patent. Anatomic variants: None. Review of the MIP images confirms the above findings IMPRESSION: 1. 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 2. Mild bilateral vertebral artery origin stenoses. 3. Left-sided carotid atherosclerosis without significant stenosis. 4. Aortic Atherosclerosis (ICD10-I70.0). Electronically Signed   By: Logan Bores M.D.   On: 12/19/2020 16:31   MR BRAIN WO CONTRAST  Result Date: 12/20/2020 CLINICAL DATA:  80 year old male found to have right carotid occlusion, two small acute right MCA cortical infarcts on MRI yesterday. And repeat neck CTA yesterday demonstrating subsequent acute large vessel occlusion at the  right MCA bifurcation. Then status post endovascular thrombectomy. EXAM: MRI HEAD WITHOUT CONTRAST TECHNIQUE: Multiplanar, multiecho pulse sequences of the brain and surrounding structures were obtained without intravenous contrast. COMPARISON:  Brain MRI 1418 hours yesterday, along  with CTA head and neck x2 yesterday. FINDINGS: Brain: There remain only a few small cortical and subcortical white matter infarcts scattered in the right MCA territory, such as on series 2, image 40. About six such areas are present now, versus the two seen yesterday. No contralateral left hemisphere or posterior fossa restricted diffusion. Minor cytotoxic edema in the affected areas with no acute hemorrhage. No mass effect. A few scattered chronic microhemorrhages in the brain, mostly the left hemisphere, are stable since yesterday. Stable pre-existing scattered white matter T2 and FLAIR hyperintensity, and a small area of chronic encephalomalacia in the left occipital pole. Stable small chronic left cerebellar infarct near midline. No midline shift, mass effect, evidence of mass lesion, ventriculomegaly, extra-axial collection or acute intracranial hemorrhage. Cervicomedullary junction and pituitary are within normal limits. Vascular: Major intracranial vascular flow voids are now normalized, including the right ICA siphon. Skull and upper cervical spine: Stable and negative for age. Sinuses/Orbits: Stable and negative. Other: Trace mastoid fluid is stable. Negative visible scalp and face. IMPRESSION: 1. Following right carotid and MCA revascularization there are only a few scattered small cortical and subcortical white matter infarcts in the right MCA territory, in addition to the two seen by MRI yesterday. No hemorrhage or mass effect. 2. Small chronic left PCA territory infarct, small left cerebellar infarct. Mild for age white matter signal changes. Electronically Signed   By: Genevie Ann M.D.   On: 12/20/2020 06:59   MR BRAIN WO CONTRAST  Result Date: 12/19/2020 CLINICAL DATA:  Worsening ataxia over the past few weeks. EXAM: MRI HEAD WITHOUT CONTRAST TECHNIQUE: Multiplanar, multiecho pulse sequences of the brain and surrounding structures were obtained without intravenous contrast. COMPARISON:  Head CT  12/19/2020 and MRI 06/14/2017 FINDINGS: Brain: There are subcentimeter acute cortical infarcts at the right temporoparietal junction as well as more superiorly in the right parietal lobe. The small left occipital infarct seen on CT is chronic. There is also a small chronic cortical infarct anterolaterally in the right frontal lobe. There is a small chronic infarct medially in the left cerebellar hemisphere. Scattered small foci of T2 hyperintensity in the cerebral white matter and pons have slightly progressed from the prior MRI and are nonspecific but compatible with mild chronic small vessel ischemic disease. There is mild generalized cerebral atrophy. Scattered chronic microhemorrhages in both cerebral hemispheres have slightly increased in number from the prior MRI. No mass, midline shift, or extra-axial fluid collection is identified. Vascular: Loss of the normal flow void of the distal cervical and proximal intracranial portions of the right internal carotid artery with reconstitution of the supraclinoid ICA flow void. Skull and upper cervical spine: Unremarkable bone marrow signal. Sinuses/Orbits: Bilateral cataract extraction. Minimal mucosal thickening in the paranasal sinuses. Small right mastoid effusion. Other: None. IMPRESSION: 1. Two small acute cortical infarcts in the right MCA territory. 2. Multiple small chronic cerebral and cerebellar infarcts as above. 3. Absent flow void in the distal cervical and proximal intracranial right internal carotid consistent with occlusion, new from 2018. Electronically Signed   By: Logan Bores M.D.   On: 12/19/2020 15:06   CT HEAD CODE STROKE WO CONTRAST  Result Date: 12/19/2020 CLINICAL DATA:  Code stroke.  Left-sided facial droop. EXAM: CT HEAD WITHOUT CONTRAST TECHNIQUE: Contiguous axial  images were obtained from the base of the skull through the vertex without intravenous contrast. COMPARISON:  Head CT, CTA, and MRI 12/19/2020 FINDINGS: Brain: No acute large  territory infarct, intracranial hemorrhage, mass, midline shift, or extra-axial fluid collection is identified. The 2 subcentimeter acute right MCA cortical infarcts on MRI are not well shown by CT. There is mild cerebral atrophy. Hypodensities in the cerebral white matter bilaterally are unchanged and nonspecific but compatible with mild chronic small vessel ischemic disease. Small chronic infarcts are again noted in the right frontal lobe and left occipital lobe. Vascular: Limited assessment due to residual intravascular contrast from today's earlier CTA. Skull: No fracture or suspicious osseous lesion. Sinuses/Orbits: Small mucous retention cyst in the right frontal sinus. Small right mastoid effusion. Bilateral cataract extraction. Other: None. ASPECTS [facility] Stroke Program Early CT Score) - Ganglionic level infarction (caudate, lentiform nuclei, internal capsule, insula, M1-M3 cortex): 7 - Supraganglionic infarction (M4-M6 cortex): 3 Total score (0-10 with 10 being normal): 10 IMPRESSION: 1. No evidence of acute large territory infarct or intracranial hemorrhage. Subcentimeter acute right MCA infarcts on MRI are occult by CT. 2. ASPECTS is 10. These results were communicated to Dr. Lorrin Goodell at 6:24 pm on 12/19/2020 by text page via the [facility] messaging system. Electronically Signed   By: Logan Bores M.D.   On: 12/19/2020 18:24   CT ANGIO HEAD CODE STROKE  Result Date: 12/19/2020 CLINICAL DATA:  Left facial droop. EXAM: CT ANGIOGRAPHY HEAD AND NECK TECHNIQUE: Multidetector CT imaging of the head and neck was performed using the standard protocol during bolus administration of intravenous contrast. Multiplanar CT image reconstructions and MIPs were obtained to evaluate the vascular anatomy. Carotid stenosis measurements (when applicable) are obtained utilizing NASCET criteria, using the distal internal carotid diameter as the denominator. CONTRAST:  61mL OMNIPAQUE IOHEXOL 350 MG/ML SOLN COMPARISON:  CTA  head and neck earlier today FINDINGS: CTA NECK FINDINGS Aortic arch: Standard 3 vessel aortic arch with mild-to-moderate calcified plaque. No evidence of significant arch vessel origin stenosis. 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 Left carotid system: Patent with mild calcified plaque in the mid common carotid artery and at the carotid bifurcation. No evidence of significant stenosis or dissection. Vertebral arteries: Calcified plaque results in unchanged mild stenosis of the proximal right subclavian artery. The vertebral arteries remain patent with unchanged mild stenosis of both origins due to calcified plaque. The left vertebral artery is mildly dominant. Skeleton: Moderate cervical disc and facet degeneration. Other neck: No evidence of cervical lymphadenopathy or mass. Post radiation changes in the neck. Upper chest: Scarring or post radiation fibrosis in the lung apices. 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 There is an early bifurcation of the right MCA with a new filling defect at the bifurcation consistent with an embolus. The origin of the right M2 superior division is narrowed but remains patent. The right M2 inferior division is occluded over approximately 6 mm long segment beginning at the MCA bifurcation with subsequent reconstitution. There is also occlusion of some small right MCA branches more distally compared to today's earlier CTA. The left MCA and both ACAs remain patent with branch vessel irregularity but no evidence of a significant proximal stenosis. No aneurysm is identified. Posterior circulation: The intracranial vertebral  arteries are patent to the basilar with atherosclerosis resulting in  mild luminal irregularity and at most mild narrowing bilaterally. Patent PICA and SCA origins are identified bilaterally. The basilar artery is widely patent. There is a small right posterior communicating artery. Both PCAs are patent with mild atherosclerotic irregularity but no evidence of a significant proximal stenosis. No aneurysm is identified. Venous sinuses: Patent. Anatomic variants: None. Review of the MIP images confirms the above findings IMPRESSION: 1. New embolus at the right MCA bifurcation with short segment occlusion of the M2 inferior division, narrowing of the origin of the M2 superior division, and occlusion of some more distal MCA branch vessels. 2. 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 3. Mild bilateral vertebral artery origin stenoses. 4. Left-sided carotid atherosclerosis without significant stenosis. 5.  Aortic Atherosclerosis (ICD10-I70.0). These results were communicated to Dr. Lorrin Goodell at 6:24 pm on 12/19/2020 by text page via the [facility] messaging system. Electronically Signed   By: Logan Bores M.D.   On: 12/19/2020 18:38   CT ANGIO NECK CODE STROKE  Result Date: 12/19/2020 CLINICAL DATA:  Left facial droop. EXAM: CT ANGIOGRAPHY HEAD AND NECK TECHNIQUE: Multidetector CT imaging of the head and neck was performed using the standard protocol during bolus administration of intravenous contrast. Multiplanar CT image reconstructions and MIPs were obtained to evaluate the vascular anatomy. Carotid stenosis measurements (when applicable) are obtained utilizing NASCET criteria, using the distal internal carotid diameter as the denominator. CONTRAST:  27mL OMNIPAQUE IOHEXOL 350 MG/ML SOLN COMPARISON:  CTA head and neck earlier today FINDINGS: CTA NECK FINDINGS Aortic arch: Standard 3 vessel aortic arch with mild-to-moderate calcified plaque. No evidence of significant arch vessel origin  stenosis. 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 Left carotid system: Patent with mild calcified plaque in the mid common carotid artery and at the carotid bifurcation. No evidence of significant stenosis or dissection. Vertebral arteries: Calcified plaque results in unchanged mild stenosis of the proximal right subclavian artery. The vertebral arteries remain patent with unchanged mild stenosis of both origins due to calcified plaque. The left vertebral artery is mildly dominant. Skeleton: Moderate cervical disc and facet degeneration. Other neck: No evidence of cervical lymphadenopathy or mass. Post radiation changes in the neck. Upper chest: Scarring or post radiation fibrosis in the lung apices. 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 There is an early bifurcation of the right MCA with a new filling defect at the bifurcation consistent with an embolus. The origin of the right M2 superior division is narrowed but remains patent. The right M2 inferior division is occluded over approximately 6 mm long segment beginning at the MCA bifurcation with subsequent reconstitution. There is also occlusion of some small right MCA branches more distally compared to today's earlier CTA. The left MCA and both ACAs remain patent with branch vessel irregularity but no evidence of a significant proximal stenosis. No aneurysm is identified. Posterior circulation: The intracranial vertebral arteries are patent to the basilar with atherosclerosis resulting in mild luminal irregularity and at most mild narrowing bilaterally. Patent PICA and SCA origins are identified bilaterally. The basilar artery is widely patent. There is a small right  posterior communicating artery. Both  PCAs are patent with mild atherosclerotic irregularity but no evidence of a significant proximal stenosis. No aneurysm is identified. Venous sinuses: Patent. Anatomic variants: None. Review of the MIP images confirms the above findings IMPRESSION: 1. New embolus at the right MCA bifurcation with short segment occlusion of the M2 inferior division, narrowing of the origin of the M2 superior division, and occlusion of some more distal MCA branch vessels. 2. 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 3. Mild bilateral vertebral artery origin stenoses. 4. Left-sided carotid atherosclerosis without significant stenosis. 5.  Aortic Atherosclerosis (ICD10-I70.0). These results were communicated to Dr. Lorrin Goodell at 6:24 pm on 12/19/2020 by text page via the [facility] messaging system. Electronically Signed   By: Logan Bores M.D.   On: 12/19/2020 18:38    PHYSICAL EXAM Blood pressure (!) 128/58, pulse 69, temperature 98.3 F (36.8 C), temperature source Oral, resp. rate 18, height 6\' 1"  (1.854 m), weight 80.3 kg, SpO2 97 %.  General: alert and awake, pleasant elderly caucasian male, no apparent distress  Lungs: Symmetrical Chest rise, no labored breathing  Cardio: Regular Rate and Rhythm  Abdomen: Soft, non-tender  Neuro: Alert, oriented, thought content appropriate.  Speech fluent without evidence of aphasia.  Able to follow all commands without difficulty. Cranial Nerves: II:  Visual fields grossly normal, pupils equal, round, reactive to light and accommodation III,IV, VI: ptosis not present, extra-ocular motions intact bilaterally V,VII: smile symmetric, facial light touch sensation normal bilaterally VIII: hearing normal bilaterally IX,X: uvula rises symmetrically XI: bilateral shoulder shrug XII: midline tongue extension without atrophy or  fasciculations  Motor: Right : Upper extremity   5/5    Left:     Upper extremity   5/5  Lower extremity   5/5     Lower extremity   5/5 Tone and bulk:normal tone throughout; no atrophy noted.  Diminished fine finger movements on the left.  Orbits right over left upper extremity. Sensory: light touch intact throughout, bilaterally Cerebellar: normal finger-to-nose,  normal heel-to-shin test Gait: deferred  NIH stroke scale 0  ASSESSMENT/PLAN Mr. MICHIAH MUDRY is a 80 y.o. male with history of aortic atherosclerosis, bilateral renal artery stenosis, chronic kidney disease, coronary artery disease on 81 mg daily ASA, gastroesophageal reflux disease, hyperlipidemia, chronic orthostatic hypotension, and BPH presenting with left sided weakness and left facial droop. [facility] with concerns for generalized weakness: bilateral leg weakness, tremulous movements, and vision changes that occurred at home in "spells" since 3/29. MRI at [facility] revealed two small acute right MCA territory strokes. Neurology was consulted and requested CT angio head and neck with immediate transfer to [facility]. 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 On transfer from [place] to [facility], EMS noted patient to have generalized weakness with initial assessment. During transfer, EMS noticed an acute neurologic change where the patient had sudden left arm weakness and left facial droop. He was brought straight to the ED and a Code Stroke was activated. Imaging was repeated revealing a new embolus at the the right MCA bifurcation and IR was activated for thrombectomy. At baseline patient is independent and healthy, able to care for ADLs independently and works out daily.   Post procedure he is doing very well. His strength is back to baseline and his walking appears improved. He currently has no complaints. OT has no follow up recommendations, PT  and  SLP recommend outpatient follow up. Potentially could discharge home in the next couple days.  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 Correlate with findings on the pending brain MRI. Stable mild generalized parenchymal atrophy. Small right frontal sinus mucous retention cyst.  MR Brain WO Contrast 3/30 1509- Two small acute cortical infarcts in the right MCA territory. Multiple small chronic cerebral and cerebellar infarcts as above. Absent flow void in the distal cervical and proximal intracranial right internal carotid consistent with occlusion, new from 2018.    CTA head & neck 3/30 1633- Occlusion of the right common carotid artery and right cervical ICA with distal intracranial reconstitution. Mild bilateral vertebral artery origin stenoses. Left-sided carotid atherosclerosis without significant stenosis. Aortic Atherosclerosis.  Code Stroke CT head 3/30 1827- No evidence of acute large territory infarct or intracranial hemorrhage. Subcentimeter acute right MCA infarcts on MRI are occult by CT. ASPECTS is 10.  CT Angio Head & Neck Code Stroke 3/30 1840 - New embolus at the right MCA bifurcation with short segment occlusion of the M2 inferior division, narrowing of the origin of the M2 superior division, and occlusion of some more distal MCA branch vessels. 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 Mild bilateral vertebral artery origin stenoses. Left-sided carotid atherosclerosis without significant stenosis. Aortic Atherosclerosis.  CT Head WO  Contrast 3/31 0153 - No evidence of acute intracranial hemorrhage or infarct. Known tiny punctate cortical infarcts within the right temporoparietal regions on a prior MRI examination are not well appreciated on this exam.  MR Brain WO Contrast 3/31 0701 - Following right carotid and MCA revascularization there are only a few scattered small cortical and subcortical white matter infarcts in the right MCA territory, in addition to the two seen by MRI yesterday. No hemorrhage or mass effect. Small chronic left PCA territory infarct, small left cerebellar infarct. Mild for age white matter signal changes.  LDL 30  HgbA1c 6.3  VTE prophylaxis - Lovenox    Diet   Diet regular Room service appropriate? Yes; Fluid consistency: Thin     aspirin 81 mg daily prior to admission, now on aspirin 81 mg daily and Brilinta (ticagrelor) 90 mg bid. Will continue for several months due to stenting until follow up with IR.  Therapy recommendations:  Outpatient PT and SLP  Disposition:  Pending  Normotension  Home meds:  none  Stable . Long-term BP goal normotensive  Hyperlipidemia  Home meds:  Crestor, resumed in hospital  LDL 30, goal < 70  Will not make changes as he is at goal  Continue statin at discharge  Pre Diabetes  Home meds:  none  HgbA1c 6.3, goal < 7.0  CBGs Recent Labs    12/19/20 1759  GLUCAP 97      SSI  Other Stroke Risk Factors  Advanced Age >/= 25   Former Cigarette smoker  Coronary artery disease   Other Active Problems  Chronic Pain - restart home Ativan 1 mg daily and Gabapentin 900 mg TID  Hospital day # 1  Fatima Sanger MD Resident I have  personally obtained history,examined this patient, reviewed notes, independently viewed imaging studies, participated in medical decision making and plan of care.ROS completed by me personally and pertinent positives fully documented  I have made any additions or clarifications directly to the above note.  Agree with note above.  Patient presented with symptoms of lower extremity weakness and tremulousness likely limb shaking TIAs from subacute right carotid occlusion but during transfer from outside hospital developed a new right M1 occlusion and underwent emergent thrombectomy followed by rescue right carotid angioplasty and stenting and has done well postprocedure.  Continue close neurological monitoring and strict blood pressure control with systolic range between 081-448 for first 24 hours.  Mobilize out of bed.  Therapy consults.  Aspirin and Brilinta for a few months for his fresh carotid stent.  Long discussion with the patient and his wife at the bedside and answered questions. This patient is critically ill and at significant risk of neurological worsening, death and care requires constant monitoring of vital signs, hemodynamics,respiratory and cardiac monitoring, extensive review of multiple databases, frequent neurological assessment, discussion with family, other specialists and medical decision making of high complexity.I have made any additions or clarifications directly to the above note.This critical care time does not reflect procedure time, or teaching time or supervisory time of PA/NP/Med Resident etc but could involve care discussion time.  I spent 30 minutes of neurocritical care time  in the care of  this patient.      Antony Contras, MD Medical Director [place] Pager: [phone] 12/20/2020 3:47 PM   To contact Stroke Continuity provider, please refer to [email or web address] After hours, contact General Neurology

## 2020-12-21 MED ORDER — TICAGRELOR 90 MG PO TABS: 90.0000 mg | ORAL_TABLET | Freq: Two times a day (BID) | ORAL | 1 refills | Status: DC

## 2020-12-21 MED ORDER — ASPIRIN 81 MG PO TBEC: 81.0000 mg | DELAYED_RELEASE_TABLET | Freq: Every day | ORAL | 11 refills | Status: AC

## 2020-12-21 NOTE — Progress Notes (Signed)
Reviewed discharge instructions and stroke mapping book with patient and wife [name]), answered all questions. Patient was discharged via wheelchair to private vehicle. All belongings accounted for.

## 2020-12-21 NOTE — Discharge Summary (Addendum)
Stroke Discharge Summary  Patient ID: George Bradley   MRN: [id number]      DOB: 03-25-1941  Date of Admission: 12/19/2020 Date of Discharge: 12/21/2020  Attending Physician:  No att. providers found, Stroke MD Consultant(s):    cardiology Dr. Peter Martinique and interventional neuroradiology Dr. 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   aspirin 81 MG EC tablet Take 1 tablet (81 mg total) by mouth daily. Swallow whole. Start taking on: December 22, 2020   busPIRone 5 MG tablet Commonly known as: BUSPAR Take 5 mg by mouth 2 (two) times daily as needed (anxiety).   donepezil 5 MG tablet Commonly known as: ARICEPT Take 5 mg by mouth at bedtime.   fludrocortisone 0.1 MG tablet Commonly known as: FLORINEF Take 0.1 mg by mouth daily.   gabapentin 300 MG capsule Commonly known as: NEURONTIN TAKE 3 CAPSULES(900 MG) BY MOUTH THREE TIMES DAILY What changed: See the new instructions.   indomethacin 25 MG capsule Commonly known as: INDOCIN Take 25 mg by mouth 2 (two) times daily with a meal.   levothyroxine 100 MCG tablet Commonly known as: SYNTHROID TAKE ONE TABLET BY MOUTH ONCE DAILY BEFORE  BREAKFAST What changed: See the new instructions.   LORazepam 1 MG tablet Commonly known as: ATIVAN TAKE 1 TABLET BY MOUTH THREE TIMES DAILY What changed: when to  take this   pilocarpine 7.5 MG tablet Commonly known as: SALAGEN Take 7.5 mg by mouth 3 (three) times daily.   Repatha SureClick 637 MG/ML Soaj Generic drug: Evolocumab Inject 140 mg into the skin every 14 (fourteen) days.   rosuvastatin 20 MG tablet Commonly known as: CRESTOR TAKE 1 TABLET BY MOUTH DAILY   ticagrelor 90 MG Tabs tablet Commonly known as: BRILINTA Take 1 tablet (90 mg total) by mouth 2 (two) times daily.   traMADol 50 MG tablet Commonly known as: ULTRAM Take 50 mg by mouth 2 (two) times daily as needed for moderate pain.       LABORATORY STUDIES CBC    Component Value Date/Time   WBC 13.6 (H) 12/20/2020 0240   RBC 4.36 12/20/2020 0240   HGB 14.4 12/20/2020 0240   HGB 13.1 04/11/2019 0740   HGB 13.6 08/21/2017 0952   HCT 43.7 12/20/2020 0240   HCT 40.7 08/21/2017 0952   PLT 138 (L) 12/20/2020 0240   PLT 119 (L) 04/11/2019 0740   PLT 138 (L) 08/21/2017 0952   PLT 163 04/02/2017 1449   MCV 100.2 (H) 12/20/2020 0240   MCV 96.2 08/21/2017 0952   MCH 33.0 12/20/2020 0240   MCHC 33.0 12/20/2020 0240   RDW 14.7 12/20/2020 0240   RDW 13.5 08/21/2017 0952   LYMPHSABS 1.9 06/21/2020 0744   LYMPHSABS 1.5 08/21/2017 0952   MONOABS 0.9 06/21/2020 0744  MONOABS 0.7 08/21/2017 0952   EOSABS 0.2 06/21/2020 0744   EOSABS 0.2 08/21/2017 0952   BASOSABS 0.0 06/21/2020 0744   BASOSABS 0.0 08/21/2017 0952   CMP    Component Value Date/Time   NA 138 12/20/2020 0240   NA 141 08/14/2020 1431   NA 140 08/21/2017 0952   K 4.3 12/20/2020 0240   K 5.1 08/21/2017 0952   CL 107 12/20/2020 0240   CL 106 11/25/2012 0812   CO2 27 12/20/2020 0240   CO2 25 08/21/2017 0952   GLUCOSE 107 (H) 12/20/2020 0240   GLUCOSE 92 08/21/2017 0952   GLUCOSE 102 (H) 11/25/2012 0812   BUN 16 12/20/2020 0240   BUN 19 08/14/2020 1431   BUN 33.9 (H) 08/21/2017 0952   CREATININE 1.03 12/20/2020 0240   CREATININE 1.30 (H) 04/11/2019 0740   CREATININE 1.6 (H) 08/21/2017 0952    CALCIUM 8.2 (L) 12/20/2020 0240   CALCIUM 10.0 08/21/2017 0952   PROT 6.4 (L) 12/20/2020 0240   PROT 7.3 05/20/2018 0820   PROT 8.5 (H) 08/21/2017 0952   ALBUMIN 3.4 (L) 12/20/2020 0240   ALBUMIN 4.6 05/20/2018 0820   ALBUMIN 4.2 08/21/2017 0952   AST 27 12/20/2020 0240   AST 21 04/11/2019 0740   AST 23 08/21/2017 0952   ALT 37 12/20/2020 0240   ALT 16 04/11/2019 0740   ALT 19 08/21/2017 0952   ALKPHOS 27 (L) 12/20/2020 0240   ALKPHOS 40 08/21/2017 0952   BILITOT 0.6 12/20/2020 0240   BILITOT 0.3 04/11/2019 0740   BILITOT 0.26 08/21/2017 0952   GFRNONAA >60 12/20/2020 0240   GFRNONAA 52 (L) 04/11/2019 0740   GFRAA 61 08/14/2020 1431   GFRAA >60 04/11/2019 0740   COAGS Lab Results  Component Value Date   INR 1.0 12/19/2020   INR 1.0 04/02/2017   INR 1.07 11/11/2011   Lipid Panel    Component Value Date/Time   CHOL 113 12/20/2020 0240   CHOL 91 (L) 05/20/2018 0820   TRIG 106 12/20/2020 0240   HDL 62 12/20/2020 0240   HDL 55 05/20/2018 0820   CHOLHDL 1.8 12/20/2020 0240   VLDL 21 12/20/2020 0240   LDLCALC 30 12/20/2020 0240   LDLCALC 23 05/20/2018 0820   HgbA1C  Lab Results  Component Value Date   HGBA1C 6.3 (H) 12/20/2020   Urinalysis    Component Value Date/Time   COLORURINE STRAW (A) 06/21/2020 1123   APPEARANCEUR CLEAR 06/21/2020 1123   LABSPEC 1.006 06/21/2020 1123   LABSPEC 1.005 06/25/2011 1502   PHURINE 6.0 06/21/2020 1123   GLUCOSEU NEGATIVE 06/21/2020 1123   HGBUR NEGATIVE 06/21/2020 1123   BILIRUBINUR NEGATIVE 06/21/2020 1123   BILIRUBINUR Negative 06/25/2011 1502   KETONESUR NEGATIVE 06/21/2020 1123   PROTEINUR NEGATIVE 06/21/2020 1123   UROBILINOGEN 0.2 10/09/2011 1634   NITRITE NEGATIVE 06/21/2020 1123   LEUKOCYTESUR NEGATIVE 06/21/2020 1123   LEUKOCYTESUR Negative 06/25/2011 1502   Urine Drug Screen No results found for: LABOPIA, COCAINSCRNUR, LABBENZ, AMPHETMU, THCU, LABBARB  Alcohol Level No results found for: [facility]   SIGNIFICANT  DIAGNOSTIC STUDIES CT Angio Head W or Wo Contrast  Result Date: 12/19/2020 CLINICAL DATA:  Worsening weakness and dizzy spells for 1 month. Small acute right MCA infarcts on MRI with evidence of right ICA occlusion. EXAM: CT ANGIOGRAPHY HEAD AND NECK TECHNIQUE: Multidetector CT imaging of the head and neck was performed using the standard protocol during bolus administration of intravenous contrast. Multiplanar CT image reconstructions and MIPs were obtained to  evaluate the vascular anatomy. Carotid stenosis measurements (when applicable) are obtained utilizing NASCET criteria, using the distal internal carotid diameter as the denominator. CONTRAST:  183mL OMNIPAQUE IOHEXOL 350 MG/ML SOLN COMPARISON:  Head MRI 12/19/2020 FINDINGS: CTA NECK FINDINGS Aortic arch: Standard 3 vessel aortic arch with mild-to-moderate calcified plaque. No evidence of significant arch vessel origin stenosis. Right carotid system: The common carotid artery is occluded at its origin without common or internal carotid artery reconstitution in the neck. There is reconstitution of ECA branches. Left carotid system: Patent with mild calcified plaque at the carotid bifurcation. No evidence of significant stenosis or dissection. Vertebral arteries: Calcified plaque results in mild stenosis of the proximal right subclavian artery. The vertebral arteries are patent with the left being mildly dominant. There is mild stenosis of both vertebral artery origins due to calcified plaque. Skeleton: Moderate cervical disc and facet degeneration. Other neck: No evidence of cervical lymphadenopathy or mass. Upper chest: Scarring/potential post radiation fibrosis in the lung apices. Coronary atherosclerosis. Review of the MIP images confirms the above findings CTA HEAD FINDINGS Anterior circulation: There is partial reconstitution of the petrous segment of the right ICA, however the cavernous segment appears largely occluded. There is more robust  reconstitution of the right supraclinoid ICA. The intracranial left ICA is patent with mild calcified plaque not resulting in significant stenosis. ACAs and MCAs are patent without evidence of a proximal branch occlusion or significant proximal stenosis. There is slight diffuse asymmetric attenuation of the right MCA compared to the left. No aneurysm is identified. Posterior circulation: The intracranial vertebral arteries are patent with to the basilar at most mild atherosclerotic narrowing bilaterally. Patent PICA and SCA origins are identified bilaterally. The basilar artery is widely patent. There is a small right posterior communicating artery. Both PCAs are patent with mild atherosclerotic irregularity but no evidence of a significant proximal stenosis. No aneurysm is identified. Venous sinuses: Patent. Anatomic variants: None. Review of the MIP images confirms the above findings IMPRESSION: 1. 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 2. Mild bilateral vertebral artery origin stenoses. 3. Left-sided carotid atherosclerosis without significant stenosis. 4. Aortic Atherosclerosis (ICD10-I70.0). Electronically Signed   By: Logan Bores M.D.   On: 12/19/2020 16:31   CT HEAD WO CONTRAST  Result Date: 12/20/2020 CLINICAL DATA:  Right carotid and MCA thrombectomy. Right MCA territory CVA. EXAM: CT HEAD WITHOUT CONTRAST TECHNIQUE: Contiguous axial images were obtained from the base of the skull through the vertex without intravenous contrast. COMPARISON:  12/19/2020 FINDINGS: Brain: Normal anatomic configuration. Parenchymal volume loss is commensurate with the patient's age. Mild periventricular white matter changes are present likely reflecting the sequela of small vessel ischemia. Remote left occipital cortical infarct is unchanged. No abnormal intra or extra-axial mass lesion or fluid collection. No abnormal mass effect or midline shift. No  evidence of acute intracranial hemorrhage or infarct. Ventricular size is normal. Cerebellum unremarkable. Vascular: No asymmetric hyperdense vasculature at the skull base. Skull: Intact Sinuses/Orbits: Paranasal sinuses are clear. Orbits are unremarkable. Other: Mastoid air cells and middle ear cavities are clear. IMPRESSION: No evidence of acute intracranial hemorrhage or infarct. Known tiny punctate cortical infarcts within the right temporoparietal regions on a prior MRI examination are not well appreciated on this exam. Electronically Signed   By: Fidela Salisbury MD   On: 12/20/2020 01:50   CT Head Wo Contrast  Result Date: 12/19/2020 CLINICAL DATA:  Motor neuron disease. Additional history provided: Patient reports  worsening weakness and dizzy spells for 1 month, symptoms worsening this week, fall 2 days ago. EXAM: CT HEAD WITHOUT CONTRAST TECHNIQUE: Contiguous axial images were obtained from the base of the skull through the vertex without intravenous contrast. COMPARISON:  Brain MRI 06/14/2017. FINDINGS: Brain: Mild cerebral and cerebellar atrophy. A small cortically based infarct within the left occipital lobe (PCA vascular territory) is new as compared to the brain MRI of 06/14/2017, but otherwise age-indeterminate. There is no acute intracranial hemorrhage. No extra-axial fluid collection. No evidence of intracranial mass. No midline shift. Vascular: No hyperdense vessel.  Atherosclerotic calcifications. Skull: Normal. Negative for fracture or focal lesion. Sinuses/Orbits: Visualized orbits show no acute finding. Small mucous retention cyst within the inferior right frontal sinus. IMPRESSION: A small cortically based infarct within the left occipital lobe (PCA vascular territory) is new from the brain MRI of 06/14/2017, but otherwise age-indeterminate. Correlate with findings on the pending brain MRI. Stable mild generalized parenchymal atrophy. Small right frontal sinus mucous retention cyst.  Electronically Signed   By: Kellie Simmering DO   On: 12/19/2020 14:18   CT Angio Neck W and/or Wo Contrast  Result Date: 12/19/2020 CLINICAL DATA:  Worsening weakness and dizzy spells for 1 month. Small acute right MCA infarcts on MRI with evidence of right ICA occlusion. EXAM: CT ANGIOGRAPHY HEAD AND NECK TECHNIQUE: Multidetector CT imaging of the head and neck was performed using the standard protocol during bolus administration of intravenous contrast. Multiplanar CT image reconstructions and MIPs were obtained to evaluate the vascular anatomy. Carotid stenosis measurements (when applicable) are obtained utilizing NASCET criteria, using the distal internal carotid diameter as the denominator. CONTRAST:  131mL OMNIPAQUE IOHEXOL 350 MG/ML SOLN COMPARISON:  Head MRI 12/19/2020 FINDINGS: CTA NECK FINDINGS Aortic arch: Standard 3 vessel aortic arch with mild-to-moderate calcified plaque. No evidence of significant arch vessel origin stenosis. Right carotid system: The common carotid artery is occluded at its origin without common or internal carotid artery reconstitution in the neck. There is reconstitution of ECA branches. Left carotid system: Patent with mild calcified plaque at the carotid bifurcation. No evidence of significant stenosis or dissection. Vertebral arteries: Calcified plaque results in mild stenosis of the proximal right subclavian artery. The vertebral arteries are patent with the left being mildly dominant. There is mild stenosis of both vertebral artery origins due to calcified plaque. Skeleton: Moderate cervical disc and facet degeneration. Other neck: No evidence of cervical lymphadenopathy or mass. Upper chest: Scarring/potential post radiation fibrosis in the lung apices. Coronary atherosclerosis. Review of the MIP images confirms the above findings CTA HEAD FINDINGS Anterior circulation: There is partial reconstitution of the petrous segment of the right ICA, however the cavernous segment  appears largely occluded. There is more robust reconstitution of the right supraclinoid ICA. The intracranial left ICA is patent with mild calcified plaque not resulting in significant stenosis. ACAs and MCAs are patent without evidence of a proximal branch occlusion or significant proximal stenosis. There is slight diffuse asymmetric attenuation of the right MCA compared to the left. No aneurysm is identified. Posterior circulation: The intracranial vertebral arteries are patent with to the basilar at most mild atherosclerotic narrowing bilaterally. Patent PICA and SCA origins are identified bilaterally. The basilar artery is widely patent. There is a small right posterior communicating artery. Both PCAs are patent with mild atherosclerotic irregularity but no evidence of a significant proximal stenosis. No aneurysm is identified. Venous sinuses: Patent. Anatomic variants: None. Review of the MIP images confirms the above findings  IMPRESSION: 1. 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 2. Mild bilateral vertebral artery origin stenoses. 3. Left-sided carotid atherosclerosis without significant stenosis. 4. Aortic Atherosclerosis (ICD10-I70.0). Electronically Signed   By: Logan Bores M.D.   On: 12/19/2020 16:31   MR BRAIN WO CONTRAST  Result Date: 12/20/2020 CLINICAL DATA:  80 year old male found to have right carotid occlusion, two small acute right MCA cortical infarcts on MRI yesterday. And repeat neck CTA yesterday demonstrating subsequent acute large vessel occlusion at the right MCA bifurcation. Then status post endovascular thrombectomy. EXAM: MRI HEAD WITHOUT CONTRAST TECHNIQUE: Multiplanar, multiecho pulse sequences of the brain and surrounding structures were obtained without intravenous contrast. COMPARISON:  Brain MRI 1418 hours yesterday, along with CTA head and neck x2 yesterday. FINDINGS: Brain: There remain only a few small  cortical and subcortical white matter infarcts scattered in the right MCA territory, such as on series 2, image 40. About six such areas are present now, versus the two seen yesterday. No contralateral left hemisphere or posterior fossa restricted diffusion. Minor cytotoxic edema in the affected areas with no acute hemorrhage. No mass effect. A few scattered chronic microhemorrhages in the brain, mostly the left hemisphere, are stable since yesterday. Stable pre-existing scattered white matter T2 and FLAIR hyperintensity, and a small area of chronic encephalomalacia in the left occipital pole. Stable small chronic left cerebellar infarct near midline. No midline shift, mass effect, evidence of mass lesion, ventriculomegaly, extra-axial collection or acute intracranial hemorrhage. Cervicomedullary junction and pituitary are within normal limits. Vascular: Major intracranial vascular flow voids are now normalized, including the right ICA siphon. Skull and upper cervical spine: Stable and negative for age. Sinuses/Orbits: Stable and negative. Other: Trace mastoid fluid is stable. Negative visible scalp and face. IMPRESSION: 1. Following right carotid and MCA revascularization there are only a few scattered small cortical and subcortical white matter infarcts in the right MCA territory, in addition to the two seen by MRI yesterday. No hemorrhage or mass effect. 2. Small chronic left PCA territory infarct, small left cerebellar infarct. Mild for age white matter signal changes. Electronically Signed   By: Genevie Ann M.D.   On: 12/20/2020 06:59   MR BRAIN WO CONTRAST  Result Date: 12/19/2020 CLINICAL DATA:  Worsening ataxia over the past few weeks. EXAM: MRI HEAD WITHOUT CONTRAST TECHNIQUE: Multiplanar, multiecho pulse sequences of the brain and surrounding structures were obtained without intravenous contrast. COMPARISON:  Head CT 12/19/2020 and MRI 06/14/2017 FINDINGS: Brain: There are subcentimeter acute cortical  infarcts at the right temporoparietal junction as well as more superiorly in the right parietal lobe. The small left occipital infarct seen on CT is chronic. There is also a small chronic cortical infarct anterolaterally in the right frontal lobe. There is a small chronic infarct medially in the left cerebellar hemisphere. Scattered small foci of T2 hyperintensity in the cerebral white matter and pons have slightly progressed from the prior MRI and are nonspecific but compatible with mild chronic small vessel ischemic disease. There is mild generalized cerebral atrophy. Scattered chronic microhemorrhages in both cerebral hemispheres have slightly increased in number from the prior MRI. No mass, midline shift, or extra-axial fluid collection is identified. Vascular: Loss of the normal flow void of the distal cervical and proximal intracranial portions of the right internal carotid artery with reconstitution of the supraclinoid ICA flow void. Skull and upper cervical spine: Unremarkable bone marrow signal. Sinuses/Orbits: Bilateral cataract extraction. Minimal mucosal thickening in the  paranasal sinuses. Small right mastoid effusion. Other: None. IMPRESSION: 1. Two small acute cortical infarcts in the right MCA territory. 2. Multiple small chronic cerebral and cerebellar infarcts as above. 3. Absent flow void in the distal cervical and proximal intracranial right internal carotid consistent with occlusion, new from 2018. Electronically Signed   By: Logan Bores M.D.   On: 12/19/2020 15:06   ECHOCARDIOGRAM COMPLETE  Result Date: 12/20/2020    ECHOCARDIOGRAM REPORT   Patient Name:   WOODIE DEGRAFFENREID Date of Exam: 12/20/2020 Medical Rec #:  [id number]     Height:       73.0 in Accession #:    [id number]    Weight:       177.0 lb Date of Birth:  Nov 22, 1940     BSA:          2.043 m Patient Age:    63 years      BP:           133/75 mmHg Patient Gender: M             HR:           76 bpm. Exam Location:  Inpatient  Procedure: 2D Echo Indications:    Stroke I63.9  History:        Patient has prior history of Echocardiogram examinations, most                 recent 07/13/2020. CAD, Aortic Valve Disease; Risk                 Factors:Dyslipidemia.  Sonographer:    Mikki Santee RDCS (AE) Referring Phys: [id number] [place]  1. Left ventricular ejection fraction, by estimation, is 50 to 55%. The left ventricle has low normal function. The left ventricle demonstrates regional wall motion abnormalities (see scoring diagram/findings for description). Left ventricular diastolic  parameters are indeterminate. There is mild hypokinesis of the left ventricular, apical inferior wall.  2. Right ventricular systolic function is moderately reduced. The right ventricular size is normal.  3. Left atrial size was mild to moderately dilated.  4. Right atrial size was mildly dilated.  5. The mitral valve is grossly normal. Mild mitral valve regurgitation.  6. Small Lambl's excrescence vs calcification seen on ventricular aspect of aortic valve. The aortic valve is calcified. There is severe calcifcation of the aortic valve. There is severe thickening of the aortic valve. Aortic valve regurgitation is mild. Moderate aortic valve stenosis. Aortic valve mean gradient measures 21.0 mmHg. Comparison(s): No significant change from prior study. Conclusion(s)/Recommendation(s): No intracardiac source of embolism detected on this transthoracic study. A transesophageal echocardiogram is recommended to exclude cardiac source of embolism if clinically indicated. FINDINGS  Left Ventricle: Left ventricular ejection fraction, by estimation, is 50 to 55%. The left ventricle has low normal function. The left ventricle demonstrates regional wall motion abnormalities. Mild hypokinesis of the left ventricular, apical inferior wall. The left ventricular internal cavity size was normal in size. There is no left ventricular hypertrophy. Left  ventricular diastolic parameters are indeterminate. Right Ventricle: The right ventricular size is normal. No increase in right ventricular wall thickness. Right ventricular systolic function is moderately reduced. Left Atrium: Left atrial size was mild to moderately dilated. Right Atrium: Right atrial size was mildly dilated. Pericardium: There is no evidence of pericardial effusion. Mitral Valve: The mitral valve is grossly normal. There is mild thickening of the mitral valve leaflet(s). There is mild calcification of the mitral valve leaflet(s). Mild  to moderate mitral annular calcification. Mild mitral valve regurgitation. Tricuspid Valve: The tricuspid valve is normal in structure. Tricuspid valve regurgitation is trivial. Aortic Valve: Small Lambl's excrescence vs calcification seen on ventricular aspect of aortic valve. The aortic valve is calcified. There is severe calcifcation of the aortic valve. There is severe thickening of the aortic valve. Aortic valve regurgitation is mild. Moderate aortic stenosis is present. Aortic valve mean gradient measures 21.0 mmHg. Aortic valve peak gradient measures 31.8 mmHg. Aortic valve area, by VTI measures 1.42 cm. Pulmonic Valve: The pulmonic valve was grossly normal. Pulmonic valve regurgitation is mild. Aorta: The aortic root is normal in size and structure. Venous: The inferior vena cava was not well visualized. IAS/Shunts: The atrial septum is grossly normal.  LEFT VENTRICLE PLAX 2D LVIDd:         5.00 cm  Diastology LVIDs:         3.60 cm  LV e' medial:    5.00 cm/s LV PW:         1.10 cm  LV E/e' medial:  13.7 LV IVS:        1.10 cm  LV e' lateral:   9.57 cm/s LVOT diam:     2.20 cm  LV E/e' lateral: 7.1 LV SV:         79 LV SV Index:   39 LVOT Area:     3.80 cm  RIGHT VENTRICLE RV S prime:     7.72 cm/s TAPSE (M-mode): 1.3 cm LEFT ATRIUM             Index       RIGHT ATRIUM           Index LA diam:        3.90 cm 1.91 cm/m  RA Area:     13.50 cm LA Vol (A2C):    68.4 ml 33.48 ml/m RA Volume:   26.90 ml  13.17 ml/m LA Vol (A4C):   49.4 ml 24.18 ml/m LA Biplane Vol: 61.6 ml 30.16 ml/m  AORTIC VALVE AV Area (Vmax):    1.37 cm AV Area (Vmean):   1.27 cm AV Area (VTI):     1.42 cm AV Vmax:           282.00 cm/s AV Vmean:          220.000 cm/s AV VTI:            0.554 m AV Peak Grad:      31.8 mmHg AV Mean Grad:      21.0 mmHg LVOT Vmax:         102.00 cm/s LVOT Vmean:        73.700 cm/s LVOT VTI:          0.207 m LVOT/AV VTI ratio: 0.37  AORTA Ao Root diam: 3.30 cm MITRAL VALVE MV Area (PHT): 2.69 cm     SHUNTS MV Decel Time: 282 msec     Systemic VTI:  0.21 m MV E velocity: 68.30 cm/s   Systemic Diam: 2.20 cm MV A velocity: 103.00 cm/s MV E/A ratio:  0.66 Buford Dresser MD Electronically signed by Buford Dresser MD Signature Date/Time: 12/20/2020/7:49:25 PM    Final    CT HEAD CODE STROKE WO CONTRAST  Result Date: 12/19/2020 CLINICAL DATA:  Code stroke.  Left-sided facial droop. EXAM: CT HEAD WITHOUT CONTRAST TECHNIQUE: Contiguous axial images were obtained from the base of the skull through the vertex without intravenous contrast. COMPARISON:  Head CT, CTA, and  MRI 12/19/2020 FINDINGS: Brain: No acute large territory infarct, intracranial hemorrhage, mass, midline shift, or extra-axial fluid collection is identified. The 2 subcentimeter acute right MCA cortical infarcts on MRI are not well shown by CT. There is mild cerebral atrophy. Hypodensities in the cerebral white matter bilaterally are unchanged and nonspecific but compatible with mild chronic small vessel ischemic disease. Small chronic infarcts are again noted in the right frontal lobe and left occipital lobe. Vascular: Limited assessment due to residual intravascular contrast from today's earlier CTA. Skull: No fracture or suspicious osseous lesion. Sinuses/Orbits: Small mucous retention cyst in the right frontal sinus. Small right mastoid effusion. Bilateral cataract extraction. Other:  None. ASPECTS [facility] Stroke Program Early CT Score) - Ganglionic level infarction (caudate, lentiform nuclei, internal capsule, insula, M1-M3 cortex): 7 - Supraganglionic infarction (M4-M6 cortex): 3 Total score (0-10 with 10 being normal): 10 IMPRESSION: 1. No evidence of acute large territory infarct or intracranial hemorrhage. Subcentimeter acute right MCA infarcts on MRI are occult by CT. 2. ASPECTS is 10. These results were communicated to Dr. Lorrin Goodell at 6:24 pm on 12/19/2020 by text page via the [facility] messaging system. Electronically Signed   By: Logan Bores M.D.   On: 12/19/2020 18:24   CT ANGIO HEAD CODE STROKE  Result Date: 12/19/2020 CLINICAL DATA:  Left facial droop. EXAM: CT ANGIOGRAPHY HEAD AND NECK TECHNIQUE: Multidetector CT imaging of the head and neck was performed using the standard protocol during bolus administration of intravenous contrast. Multiplanar CT image reconstructions and MIPs were obtained to evaluate the vascular anatomy. Carotid stenosis measurements (when applicable) are obtained utilizing NASCET criteria, using the distal internal carotid diameter as the denominator. CONTRAST:  17mL OMNIPAQUE IOHEXOL 350 MG/ML SOLN COMPARISON:  CTA head and neck earlier today FINDINGS: CTA NECK FINDINGS Aortic arch: Standard 3 vessel aortic arch with mild-to-moderate calcified plaque. No evidence of significant arch vessel origin stenosis. 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 Left carotid system: Patent with mild calcified plaque in the mid common carotid artery and at the carotid bifurcation. No evidence of significant stenosis or dissection. Vertebral arteries: Calcified plaque results in unchanged mild stenosis of the proximal right subclavian artery. The vertebral arteries remain patent with unchanged mild stenosis of both origins due to calcified plaque. The  left vertebral artery is mildly dominant. Skeleton: Moderate cervical disc and facet degeneration. Other neck: No evidence of cervical lymphadenopathy or mass. Post radiation changes in the neck. Upper chest: Scarring or post radiation fibrosis in the lung apices. 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 There is an early bifurcation of the right MCA with a new filling defect at the bifurcation consistent with an embolus. The origin of the right M2 superior division is narrowed but remains patent. The right M2 inferior division is occluded over approximately 6 mm long segment beginning at the MCA bifurcation with subsequent reconstitution. There is also occlusion of some small right MCA branches more distally compared to today's earlier CTA. The left MCA and both ACAs remain patent with branch vessel irregularity but no evidence of a significant proximal stenosis. No aneurysm is identified. Posterior circulation: The intracranial vertebral arteries are patent to the basilar with atherosclerosis resulting in mild luminal irregularity and at most mild narrowing bilaterally. Patent PICA  and SCA origins are identified bilaterally. The basilar artery is widely patent. There is a small right posterior communicating artery. Both PCAs are patent with mild atherosclerotic irregularity but no evidence of a significant proximal stenosis. No aneurysm is identified. Venous sinuses: Patent. Anatomic variants: None. Review of the MIP images confirms the above findings IMPRESSION: 1. New embolus at the right MCA bifurcation with short segment occlusion of the M2 inferior division, narrowing of the origin of the M2 superior division, and occlusion of some more distal MCA branch vessels. 2. 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 3. Mild bilateral vertebral artery origin stenoses. 4. Left-sided carotid atherosclerosis without significant stenosis. 5.  Aortic Atherosclerosis (ICD10-I70.0). These results were communicated to Dr. Lorrin Goodell at 6:24 pm on 12/19/2020 by text page via the [facility] messaging system. Electronically Signed   By: Logan Bores M.D.   On: 12/19/2020 18:38   CT ANGIO NECK CODE STROKE  Result Date: 12/19/2020 CLINICAL DATA:  Left facial droop. EXAM: CT ANGIOGRAPHY HEAD AND NECK TECHNIQUE: Multidetector CT imaging of the head and neck was performed using the standard protocol during bolus administration of intravenous contrast. Multiplanar CT image reconstructions and MIPs were obtained to evaluate the vascular anatomy. Carotid stenosis measurements (when applicable) are obtained utilizing NASCET criteria, using the distal internal carotid diameter as the denominator. CONTRAST:  27mL OMNIPAQUE IOHEXOL 350 MG/ML SOLN COMPARISON:  CTA head and neck earlier today FINDINGS: CTA NECK FINDINGS Aortic arch: Standard 3 vessel aortic arch with mild-to-moderate calcified plaque. No evidence of significant arch vessel origin stenosis. 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 Left carotid system: Patent with mild calcified plaque in the mid common carotid artery and at the carotid bifurcation. No evidence of significant stenosis or dissection. Vertebral arteries: Calcified plaque results in unchanged mild stenosis of the proximal right subclavian artery. The vertebral arteries remain patent with unchanged mild stenosis of both origins due to calcified plaque. The left vertebral artery is mildly dominant. Skeleton: Moderate cervical disc and facet degeneration. Other neck: No evidence of cervical lymphadenopathy or mass. Post radiation changes in the  neck. Upper chest: Scarring or post radiation fibrosis in the lung apices. 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 There is an early bifurcation of the right MCA with a new filling defect at the bifurcation consistent with an embolus. The origin of the right M2 superior division is narrowed but remains patent. The right M2 inferior division is occluded over approximately 6 mm long segment beginning at the MCA bifurcation with subsequent reconstitution. There is also occlusion of some small right MCA branches more distally compared to today's earlier CTA. The left MCA and both ACAs remain patent with branch vessel irregularity but no evidence of a significant proximal stenosis. No aneurysm is identified. Posterior circulation: The intracranial vertebral arteries are patent to the basilar with atherosclerosis resulting in mild luminal irregularity and at most mild narrowing bilaterally. Patent PICA and SCA origins are identified bilaterally. The basilar artery is widely patent. There is a small right posterior communicating artery. Both PCAs are patent with mild atherosclerotic irregularity but no evidence of a significant proximal stenosis. No aneurysm is identified. Venous sinuses: Patent.  Anatomic variants: None. Review of the MIP images confirms the above findings IMPRESSION: 1. New embolus at the right MCA bifurcation with short segment occlusion of the M2 inferior division, narrowing of the origin of the M2 superior division, and occlusion of some more distal MCA branch vessels. 2. 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 3. Mild bilateral vertebral artery origin stenoses. 4. Left-sided carotid atherosclerosis without significant stenosis. 5.   Aortic Atherosclerosis (ICD10-I70.0). These results were communicated to Dr. Lorrin Goodell at 6:24 pm on 12/19/2020 by text page via the [facility] messaging system. Electronically Signed   By: Logan Bores M.D.   On: 12/19/2020 18:38      HISTORY OF PRESENT ILLNESS NIV George Bradley is a 80 y.o. male with a medical history significant for aortic atherosclerosis, bilateral renal artery stenosis, chronic kidney disease, coronary artery disease on 81 mg daily ASA, gastroesophageal reflux disease, hyperlipidemia, chronic orthostatic hypotension, and BPH who presented to [facility] with concerns for generalized weakness: bilateral leg weakness, tremulous movements, and vision changes that occurred at home in "spells" since 3/29. MRI at [facility] revealed two small acute right MCA territory strokes. Neurology was consulted and requested CT angio head and neck with immediate transfer to [facility]. 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   On transfer from [place] to [facility], EMS noted patient to have generalized weakness with initial assessment. During transfer, EMS noticed an acute neurologic change where the patient had sudden left arm weakness and left facial droop. He was brought straight to the ED and a Code Stroke was activated. Imaging was repeated revealing a new embolus at the the right MCA bifurcation and IR was activated for thrombectomy. He tolerated the procedure well.    HOSPITAL COURSE Patient had successful mechanical thrombectomy of the right MCA as well as right carotid with rescue right carotid angioplasty and stenting and was admitted to the intensive care unit where blood pressure was tightly controlled.  He was extubated and did well.  He had no focal deficits on exam except diminished fine finger movements on the left.  NIH stroke scale was 0.  He was able to ambulate independently.  He was seen by physical occupational  and speech therapy and recommended discharge home with outpatient therapies.. His strength is  Back to baseline and his walking has improved significantly. He has no complaints. PT recommends outpatient follow up.  He was advised to continue aspirin and Brilinta for a few months following his fresh carotid stent and maintain aggressive risk factor modification.       DISCHARGE EXAM Blood pressure (!) 170/80, pulse 69, temperature 97.9 F (36.6 C), temperature source Oral, resp. rate 18, height 6\' 1"  (1.854 m), weight 80.3 kg, SpO2 96 %.  PHYSICAL EXAM Blood pressure (!) 170/80, pulse 69, temperature 97.9 F (36.6 C), temperature source Oral, resp. rate 18, height 6\' 1"  (1.854 m), weight 80.3 kg, SpO2 96 %.  General: alert and awake, pleasant elderly caucasian male, no apparent distress  Lungs: Symmetrical Chest rise, no labored breathing  Cardio: Regular Rate and Rhythm  Abdomen: Soft, non-tender  Neuro: Alert, oriented, thought content appropriate.  Speech fluent without evidence of aphasia.  Able to follow all commands without difficulty. Cranial Nerves: II:  Visual fields grossly normal, pupils equal, round, reactive to light and accommodation III,IV, VI: ptosis not present,  extra-ocular motions intact bilaterally V,VII: smile symmetric, facial light touch sensation normal bilaterally VIII: hearing normal bilaterally IX,X: uvula rises symmetrically XI: bilateral shoulder shrug XII: midline tongue extension without atrophy or fasciculations  Motor: Right :  Upper extremity   5/5                                      Left:     Upper extremity   5/5             Lower extremity   5/5                                                  Lower extremity   5/5 Tone and bulk:normal tone throughout; no atrophy noted.  Diminished fine finger movements on the left.  Orbits right over left upper extremity. Sensory: light touch intact throughout, bilaterally Cerebellar: normal  finger-to-nose,  normal heel-to-shin test Gait: deferred   Discharge Diet       Diet   Diet regular Room service appropriate? Yes; Fluid consistency: Thin   liquids  DISCHARGE PLAN  Disposition:   home  aspirin 81 mg daily and Brilinta (ticagrelor) 90 mg bid for secondary stroke prevention for    Ongoing stroke risk factor control by Primary Care Physician at time of discharge  Follow-up PCP George Baton, MD in 2 weeks.  Follow-up in [facility] in 4 weeks, office to schedule an appointment.    40 minutes were spent preparing discharge.  I have personally obtained history,examined this patient, reviewed notes, independently viewed imaging studies, participated in medical decision making and plan of care.ROS completed by me personally and pertinent positives fully documented  I have made any additions or clarifications directly to the above note. Agree with note above.    Antony Contras, MD Medical Director [place] Pager: [phone] 12/21/2020 4:28 PM

## 2020-12-21 NOTE — Progress Notes (Signed)
Physical Therapy Treatment & Discharge Patient Details Name: George Bradley MRN: [id number] DOB: [birth year] Today's Date: 12/21/2020    History of Present Illness 80 yo male presenting 3/30 with left sided weakness and left facial droop. MRI at [facility] revealed two small acute right MCA territory strokes. Neurology was consulted and requested CT angio head and neck with immediate transfer to [facility]. 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 PMH including aortic atherosclerosis, bilateral renal artery stenosis, CKD, CAD, gastroesophageal reflux disease, hyperlipidemia, chronic orthostatic HTN, and BPH.    PT Comments    Pt making significant progress, demonstrating improved balance, coordination, and strength this date. Initially ambulated with rollator, but pt was able to ambulate with improved stability compared to yesterday without an AD this date. His DGI score of 20 this date even suggests he is not at high risk for falls. He displays bil ankle lateral instability and needs occasional adjustments to steps or gait speed to react safely to challenges/obstacles but displays no significant trunk sway or LOB. Pt even able to negotiate a flight of stairs with 1 rail without LOB, needing extra time and step-to pattern for safety when descending. Pt appears to be at baseline if not better than he has recently been doing at home, which was confirmed by pt and his wife. Changed d/c recs to no PT follow up. All education completed, PT will sign off at this time.    Follow Up Recommendations  No PT follow up     Equipment Recommendations  None recommended by PT    Recommendations for Other Services       Precautions / Restrictions Precautions Precautions: Fall Precaution Comments: monitor BP Restrictions Weight Bearing Restrictions: No    Mobility  Bed Mobility               General bed mobility  comments: Pt sitting up in recliner upon arrival.    Transfers Overall transfer level: Needs assistance   Transfers: Sit to/from Stand Sit to Stand: Supervision         General transfer comment: Pt able to come to stand safely with supervision, no LOB.  Ambulation/Gait Ambulation/Gait assistance: Min Gaffer (Feet): 400 Feet Assistive device: None;4-wheeled walker Gait Pattern/deviations: Step-through pattern;Decreased step length - right;Decreased step length - left;Decreased stride length;Narrow base of support Gait velocity: reduced Gait velocity interpretation: 1.31 - 2.62 ft/sec, indicative of limited community ambulator General Gait Details: Initially ambulating with rollator, displaying narrow BOS and no trunk sway but slight bil ankle lateral instability. Progressed quickly to no AD with pt demonstrating improved steadiness with no LOB compared to yesterday's session. Pt able to negotiate obstacles and perform challenges without LOB and only minor changes to gait intermittently. Min guard-supervision for safety.   Stairs Stairs: Yes Stairs assistance: Min guard Stair Management: One rail Right;One rail Left;Alternating pattern;Step to pattern Number of Stairs: 10 General stair comments: Ascendings with R rail and reciprocal gait pattern and descending with L rail with step-to pattern, no LOB. Decreased speed and mild instability descending, but could be due to pt's expressed concern over safety of PT walking backwards down stairs. Min guard for safety.   Wheelchair Mobility    Modified Rankin (Stroke Patients Only) Modified Rankin (Stroke Patients Only) Pre-Morbid Rankin Score: No symptoms Modified Rankin: No significant disability     Balance Overall balance assessment: Mild deficits observed, not formally tested  Standing balance support: No upper extremity supported;During functional activity Standing balance-Leahy Scale:  Good Standing balance comment: Pt able to ambulate without UE support without LOB, minor changes to gait with some obstacles.                 Standardized Balance Assessment Standardized Balance Assessment : Dynamic Gait Index   Dynamic Gait Index Level Surface: Normal Change in Gait Speed: Mild Impairment Gait with Horizontal Head Turns: Mild Impairment Gait with Vertical Head Turns: Normal Gait and Pivot Turn: Mild Impairment Step Over Obstacle: Normal Step Around Obstacles: Normal Steps: Mild Impairment Total Score: 20      Cognition Arousal/Alertness: Awake/alert Behavior During Therapy: WFL for tasks assessed/performed Overall Cognitive Status: History of cognitive impairments - at baseline                                 General Comments: Per wife, he is entering moderate stage of dementia      Exercises      General Comments General comments (skin integrity, edema, etc.): BP decreased slightly initially with changes in position but returned close to initial reading after gait, no signs/symptoms this date      Pertinent Vitals/Pain Pain Assessment: Faces Faces Pain Scale: No hurt Pain Intervention(s): Monitored during session    Home Living                      Prior Function            PT Goals (current goals can now be found in the care plan section) Acute Rehab PT Goals Patient Stated Goal: go home PT Goal Formulation: With patient/family Time For Goal Achievement: 01/03/21 Potential to Achieve Goals: Good Progress towards PT goals: Progressing toward goals;Goals met/education completed, patient discharged from PT    Frequency    Min 4X/week      PT Plan Discharge plan needs to be updated    Co-evaluation              AM-PAC PT "6 Clicks" Mobility   Outcome Measure  Help needed turning from your back to your side while in a flat bed without using bedrails?: None Help needed moving from lying on your back  to sitting on the side of a flat bed without using bedrails?: None Help needed moving to and from a bed to a chair (including a wheelchair)?: A Little Help needed standing up from a chair using your arms (e.g., wheelchair or bedside chair)?: A Little Help needed to walk in hospital room?: A Little Help needed climbing 3-5 steps with a railing? : A Little 6 Click Score: 20    End of Session Equipment Utilized During Treatment: Gait belt Activity Tolerance: Patient tolerated treatment well Patient left: in chair;with call bell/phone within reach;with family/visitor present Nurse Communication: Mobility status (BP and symptoms, chair alarm not on as pt does not appear to be at significant risk for falls) PT Visit Diagnosis: Unsteadiness on feet (R26.81);Other abnormalities of gait and mobility (R26.89);History of falling (Z91.81);Difficulty in walking, not elsewhere classified (R26.2);Other symptoms and signs involving the nervous system (R29.898)     Time: 3151-7616 PT Time Calculation (min) (ACUTE ONLY): 21 min  Charges:  $Gait Training: 8-22 mins                     Moishe Spice, PT, DPT Acute Rehabilitation Services  Pager: [phone]  Office: [place] 12/21/2020, 10:42 AM

## 2020-12-21 NOTE — Plan of Care (Signed)
Patient alert and oriented x4 at baseline. Capable of completing ADLs independently.

## 2020-12-21 NOTE — Progress Notes (Signed)
STROKE TEAM PROGRESS NOTE   INTERVAL HISTORY His wife is at the bedside.   Patient is doing well.  He has been able to ambulate without assistance.  He wants to go home.  He has no complaints.  Neurological exam is unchanged.  Vital signs are stable. Vitals:   12/21/20 1000 12/21/20 1100 12/21/20 1200 12/21/20 1300  BP: (!) 178/82 124/72 (!) 180/80 (!) 170/80  Pulse: 62 68 66 69  Resp: 13 15 16 18   Temp:   97.9 F (36.6 C)   TempSrc:   Oral   SpO2: 98% 98% 96% 96%  Weight:      Height:       CBC:  Recent Labs  Lab 12/19/20 2314 12/20/20 0240  WBC 12.5* 13.6*  HGB 14.5 14.4  HCT 43.7 43.7  MCV 99.1 100.2*  PLT 147* 818*   Basic Metabolic Panel:  Recent Labs  Lab 12/19/20 1201 12/19/20 2314 12/20/20 0240  NA 137  --  138  K 4.4  --  4.3  CL 101  --  107  CO2 27  --  27  GLUCOSE 96  --  107*  BUN 23  --  16  CREATININE 1.19 1.04 1.03  CALCIUM 8.7*  --  8.2*   Lipid Panel:  Recent Labs  Lab 12/20/20 0240  CHOL 113  TRIG 106  HDL 62  CHOLHDL 1.8  VLDL 21  LDLCALC 30   HgbA1c:  Recent Labs  Lab 12/20/20 0240  HGBA1C 6.3*   Urine Drug Screen: No results for input(s): LABOPIA, COCAINSCRNUR, LABBENZ, AMPHETMU, THCU, LABBARB in the last 168 hours.  Alcohol Level No results for input(s): ETH in the last 168 hours.  IMAGING past 24 hours No results found.  PHYSICAL EXAM Blood pressure (!) 170/80, pulse 69, temperature 97.9 F (36.6 C), temperature source Oral, resp. rate 18, height 6\' 1"  (1.854 m), weight 80.3 kg, SpO2 96 %.  General: alert and awake, pleasant elderly caucasian male, no apparent distress  Lungs: Symmetrical Chest rise, no labored breathing  Cardio: Regular Rate and Rhythm  Abdomen: Soft, non-tender  Neuro: Alert, oriented, thought content appropriate.  Speech fluent without evidence of aphasia.  Able to follow all commands without difficulty. Cranial Nerves: II:  Visual fields grossly normal, pupils equal, round, reactive to light  and accommodation III,IV, VI: ptosis not present, extra-ocular motions intact bilaterally V,VII: smile symmetric, facial light touch sensation normal bilaterally VIII: hearing normal bilaterally IX,X: uvula rises symmetrically XI: bilateral shoulder shrug XII: midline tongue extension without atrophy or fasciculations  Motor: Right : Upper extremity   5/5    Left:     Upper extremity   5/5  Lower extremity   5/5     Lower extremity   5/5 Tone and bulk:normal tone throughout; no atrophy noted.  Diminished fine finger movements on the left.  Orbits right over left upper extremity. Sensory: light touch intact throughout, bilaterally Cerebellar: normal finger-to-nose,  normal heel-to-shin test Gait: deferred  NIH stroke scale 0  ASSESSMENT/PLAN [name] is a 80 y.o. male with history of aortic atherosclerosis, bilateral renal artery stenosis, chronic kidney disease, coronary artery disease on 81 mg daily ASA, gastroesophageal reflux disease, hyperlipidemia, chronic orthostatic hypotension, and BPH presenting with left sided weakness and left facial droop. WLED with concerns for generalized weakness: bilateral leg weakness, tremulous movements, and vision changes that occurred at home in "spells" since 3/29. MRI at [facility] revealed two small acute right MCA territory strokes.  Neurology was consulted and requested CT angio head and neck with immediate transfer to [facility]. 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 On transfer from [place] to [facility], EMS noted patient to have generalized weakness with initial assessment. During transfer, EMS noticed an acute neurologic change where the patient had sudden left arm weakness and left facial droop. He was brought straight to the ED and a Code Stroke was activated. Imaging was repeated revealing a new embolus at the the right MCA bifurcation and IR was  activated for thrombectomy. At baseline patient is independent and healthy, able to care for ADLs independently and works out daily.   Post procedure he is doing very well. His strength is back to baseline and his walking appears improved. He currently has no complaints. OT has no follow up recommendations, PT and SLP recommend outpatient follow up. Potentially could discharge home in the next couple days.  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 Correlate with findings on the pending brain MRI. Stable mild generalized parenchymal atrophy. Small right frontal sinus mucous retention cyst.  MR Brain WO Contrast 3/30 1509- Two small acute cortical infarcts in the right MCA territory. Multiple small chronic cerebral and cerebellar infarcts as above. Absent flow void in the distal cervical and proximal intracranial right internal carotid consistent with occlusion, new from 2018.    CTA head & neck 3/30 1633- Occlusion of the right common carotid artery and right cervical ICA with distal intracranial reconstitution. Mild bilateral vertebral artery origin stenoses. Left-sided carotid atherosclerosis without significant stenosis. Aortic Atherosclerosis.  Code Stroke CT head 3/30 1827- No evidence of acute large territory infarct or intracranial hemorrhage. Subcentimeter acute right MCA infarcts on MRI are occult by CT. ASPECTS is 10.  CT Angio Head & Neck Code Stroke 3/30 1840 - New embolus at the right MCA bifurcation with short segment occlusion of the M2 inferior division, narrowing of the origin of the M2 superior division, and occlusion of some more  distal MCA branch vessels. 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 Mild bilateral vertebral artery origin stenoses. Left-sided carotid atherosclerosis without significant stenosis. Aortic Atherosclerosis.  CT Head WO Contrast 3/31 0153 - No evidence of acute intracranial hemorrhage or infarct. Known tiny punctate cortical infarcts within the right temporoparietal regions on a prior MRI examination are not well appreciated on this exam.  MR Brain WO Contrast 3/31 0701 - Following right carotid and MCA revascularization there are only a few scattered small cortical and subcortical white matter infarcts in the right MCA territory, in addition to the two seen by MRI yesterday. No hemorrhage or mass effect. Small chronic left PCA territory infarct, small left cerebellar infarct. Mild for age white matter signal changes.  LDL 30  HgbA1c 6.3  VTE prophylaxis - Lovenox    Diet   Diet regular Room service appropriate? Yes; Fluid consistency: Thin    aspirin 81 mg daily prior to admission, now on aspirin 81 mg daily and Brilinta (ticagrelor) 90 mg bid. Will  continue for several months due to stenting until follow up with IR.  Therapy recommendations:  Outpatient PT and SLP  Disposition:  Pending  Normotension  Home meds:  none  Stable . Long-term BP goal normotensive  Hyperlipidemia  Home meds:  Crestor, resumed in hospital  LDL 30, goal < 70  Will not make changes as he is at goal  Continue statin at discharge  Pre Diabetes  Home meds:  none  HgbA1c 6.3, goal < 7.0  CBGs Recent Labs    12/19/20 1759  GLUCAP 97      SSI  Other Stroke Risk Factors  Advanced Age >/= 66   Former Cigarette smoker  Coronary artery disease   Other Active Problems  Chronic Pain - restart home Ativan 1 mg daily and Gabapentin 900 mg TID  Hospital day # 2      Patient presented  with symptoms of lower extremity weakness and tremulousness likely limb shaking TIAs from subacute right carotid occlusion but during transfer from outside hospital developed a new right M1 occlusion and underwent emergent thrombectomy followed by rescue right carotid angioplasty and stenting and has done well postprocedure.  Continue.  Aspirin and Brilinta for a few months for his fresh carotid stent.  Discharge home today with outpatient therapies.  Long discussion with the patient and his wife at the bedside and answered questions.      Antony Contras, MD Medical Director [place] Pager: [phone] 12/21/2020 3:48 PM   To contact Stroke Continuity provider, please refer to [email or web address] After hours, contact General Neurology

## 2020-12-21 NOTE — Progress Notes (Signed)
Occupational Therapy Treatment and Discharge Patient Details Name: George Bradley MRN: [id number] DOB: 1941-01-02 Today's Date: 12/21/2020    History of present illness 80 yo male presenting 3/30 with left sided weakness and left facial droop. MRI at [facility] revealed two small acute right MCA territory strokes. Neurology was consulted and requested CT angio head and neck with immediate transfer to [facility]. 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 PMH including aortic atherosclerosis, bilateral renal artery stenosis, CKD, CAD, gastroesophageal reflux disease, hyperlipidemia, chronic orthostatic HTN, and BPH.   OT comments  Pt progressing towards OT goals. Pt performing grooming, dressing, and functional mobility with Supervision. Pt and wife reporting that he feels back to his baseline. Answered all questions in preparation for dc today. Will continue to follow acutely as admitted.    Follow Up Recommendations  No OT follow up    Equipment Recommendations  None recommended by OT    Recommendations for Other Services PT consult    Precautions / Restrictions Precautions Precautions: Fall Precaution Comments: monitor BP Restrictions Weight Bearing Restrictions: No       Mobility Bed Mobility               General bed mobility comments: In bathroom upon arrival    Transfers Overall transfer level: Needs assistance   Transfers: Sit to/from Stand Sit to Stand: Supervision         General transfer comment: Pt able to come to stand safely with supervision, no LOB.    Balance Overall balance assessment: Mild deficits observed, not formally tested         Standing balance support: No upper extremity supported;During functional activity Standing balance-Leahy Scale: Good Standing balance comment: Pt able to ambulate without UE support without LOB, minor changes to gait with some  obstacles.                 Standardized Balance Assessment Standardized Balance Assessment : Dynamic Gait Index   Dynamic Gait Index Level Surface: Normal Change in Gait Speed: Mild Impairment Gait with Horizontal Head Turns: Mild Impairment Gait with Vertical Head Turns: Normal Gait and Pivot Turn: Mild Impairment Step Over Obstacle: Normal Step Around Obstacles: Normal Steps: Mild Impairment Total Score: 20     ADL either performed or assessed with clinical judgement   ADL Overall ADL's : Needs assistance/impaired                                       General ADL Comments: Pt performing grooming and dressing at Supervision level. Pt near baseline     Vision       Perception     Praxis      Cognition Arousal/Alertness: Awake/alert Behavior During Therapy: WFL for tasks assessed/performed Overall Cognitive Status: History of cognitive impairments - at baseline                                 General Comments: At baseline per wife. Able to participate in conversation and follow cues        Exercises     Shoulder Instructions       General Comments VSS on RA    Pertinent Vitals/ Pain       Pain Assessment: Faces Faces Pain Scale: No hurt Pain  Intervention(s): Monitored during session;Limited activity within patient's tolerance;Repositioned  Home Living                                          Prior Functioning/Environment              Frequency  Min 2X/week        Progress Toward Goals  OT Goals(current goals can now be found in the care plan section)  Progress towards OT goals: Progressing toward goals  Acute Rehab OT Goals Patient Stated Goal: go home OT Goal Formulation: All assessment and education complete, DC therapy ADL Goals Pt Will Perform Grooming: with modified independence;standing Pt Will Perform Lower Body Dressing: with modified independence;sit to/from stand Pt Will  Transfer to Toilet: with modified independence;regular height toilet;ambulating Pt Will Perform Toileting - Clothing Manipulation and hygiene: with modified independence;sitting/lateral leans;sit to/from stand  Plan Discharge plan remains appropriate;All goals met and education completed, patient discharged from OT services    Co-evaluation                 AM-PAC OT "6 Clicks" Daily Activity     Outcome Measure   Help from another person eating meals?: None Help from another person taking care of personal grooming?: A Little Help from another person toileting, which includes using toliet, bedpan, or urinal?: A Little Help from another person bathing (including washing, rinsing, drying)?: A Little Help from another person to put on and taking off regular upper body clothing?: A Little Help from another person to put on and taking off regular lower body clothing?: A Little 6 Click Score: 19    End of Session Equipment Utilized During Treatment: Gait belt  OT Visit Diagnosis: Unsteadiness on feet (R26.81);Other abnormalities of gait and mobility (R26.89);Muscle weakness (generalized) (M62.81)   Activity Tolerance Patient tolerated treatment well   Patient Left in chair;with call bell/phone within reach;with family/visitor present   Nurse Communication Mobility status        Time: 1353-1403 OT Time Calculation (min): 10 min  Charges: OT General Charges $OT Visit: 1 Visit OT Treatments $Self Care/Home Management : 8-22 mins  [place], OTR/L Acute Rehab Pager: [phone] Office: [place] 12/21/2020, 2:13 PM

## 2020-12-21 NOTE — Progress Notes (Signed)
Physical Therapy Discharge Patient Details Name: George Bradley MRN: [id number] DOB: [birth year] Today's Date: 12/21/2020 Time: 5701-7793 PT Time Calculation (min) (ACUTE ONLY): 21 min  Patient discharged from PT services secondary to pt appears to be back at his baseline if not better than his recent baseline, thus no further PT needs identified.  Please see latest therapy progress note for current level of functioning and progress toward goals.    Progress and discharge plan discussed with patient and/or caregiver: Patient/Caregiver agrees with plan  GP   George Bradley, PT, DPT Acute Rehabilitation Services  Pager: [phone] Office: [place] 12/21/2020, 10:46 AM

## 2020-12-22 ENCOUNTER — Other Ambulatory Visit: Payer: Self-pay | Admitting: Hematology and Oncology

## 2020-12-26 ENCOUNTER — Emergency Department (HOSPITAL_COMMUNITY): Payer: Medicare Other

## 2020-12-26 ENCOUNTER — Other Ambulatory Visit: Payer: Self-pay

## 2020-12-26 ENCOUNTER — Encounter (HOSPITAL_COMMUNITY): Payer: Self-pay | Admitting: Family Medicine

## 2020-12-26 ENCOUNTER — Other Ambulatory Visit: Payer: Self-pay | Admitting: *Deleted

## 2020-12-26 ENCOUNTER — Inpatient Hospital Stay (HOSPITAL_COMMUNITY)
Admission: EM | Admit: 2020-12-26 | Discharge: 2020-12-30 | DRG: 871 | Disposition: A | Discharge status: Home / Self Care | Payer: Medicare Other | Attending: Family Medicine | Admitting: Family Medicine

## 2020-12-26 DIAGNOSIS — R1319 Other dysphagia: Secondary | ICD-10-CM

## 2020-12-26 DIAGNOSIS — Z888 Allergy status to other drugs, medicaments and biological substances status: Secondary | ICD-10-CM

## 2020-12-26 DIAGNOSIS — R6520 Severe sepsis without septic shock: Secondary | ICD-10-CM | POA: Diagnosis present

## 2020-12-26 DIAGNOSIS — R1313 Dysphagia, pharyngeal phase: Secondary | ICD-10-CM | POA: Diagnosis present

## 2020-12-26 DIAGNOSIS — Z85810 Personal history of malignant neoplasm of tongue: Secondary | ICD-10-CM

## 2020-12-26 DIAGNOSIS — I4891 Unspecified atrial fibrillation: Secondary | ICD-10-CM | POA: Diagnosis not present

## 2020-12-26 DIAGNOSIS — A419 Sepsis, unspecified organism: Principal | ICD-10-CM | POA: Diagnosis present

## 2020-12-26 DIAGNOSIS — Z951 Presence of aortocoronary bypass graft: Secondary | ICD-10-CM

## 2020-12-26 DIAGNOSIS — G9341 Metabolic encephalopathy: Secondary | ICD-10-CM | POA: Diagnosis present

## 2020-12-26 DIAGNOSIS — Z87891 Personal history of nicotine dependence: Secondary | ICD-10-CM | POA: Diagnosis not present

## 2020-12-26 DIAGNOSIS — R531 Weakness: Secondary | ICD-10-CM | POA: Diagnosis not present

## 2020-12-26 DIAGNOSIS — R0602 Shortness of breath: Secondary | ICD-10-CM | POA: Diagnosis not present

## 2020-12-26 DIAGNOSIS — J690 Pneumonitis due to inhalation of food and vomit: Secondary | ICD-10-CM | POA: Diagnosis present

## 2020-12-26 DIAGNOSIS — J189 Pneumonia, unspecified organism: Secondary | ICD-10-CM | POA: Diagnosis present

## 2020-12-26 DIAGNOSIS — N182 Chronic kidney disease, stage 2 (mild): Secondary | ICD-10-CM | POA: Diagnosis present

## 2020-12-26 DIAGNOSIS — E039 Hypothyroidism, unspecified: Secondary | ICD-10-CM | POA: Diagnosis present

## 2020-12-26 DIAGNOSIS — J9601 Acute respiratory failure with hypoxia: Secondary | ICD-10-CM | POA: Diagnosis present

## 2020-12-26 DIAGNOSIS — Z79890 Hormone replacement therapy (postmenopausal): Secondary | ICD-10-CM

## 2020-12-26 DIAGNOSIS — D696 Thrombocytopenia, unspecified: Secondary | ICD-10-CM | POA: Diagnosis not present

## 2020-12-26 DIAGNOSIS — I700 Atherosclerosis of aorta: Secondary | ICD-10-CM | POA: Diagnosis present

## 2020-12-26 DIAGNOSIS — I2510 Atherosclerotic heart disease of native coronary artery without angina pectoris: Secondary | ICD-10-CM | POA: Diagnosis present

## 2020-12-26 DIAGNOSIS — R404 Transient alteration of awareness: Secondary | ICD-10-CM | POA: Diagnosis not present

## 2020-12-26 DIAGNOSIS — N281 Cyst of kidney, acquired: Secondary | ICD-10-CM | POA: Diagnosis not present

## 2020-12-26 DIAGNOSIS — R52 Pain, unspecified: Secondary | ICD-10-CM

## 2020-12-26 DIAGNOSIS — F0390 Unspecified dementia without behavioral disturbance: Secondary | ICD-10-CM | POA: Diagnosis present

## 2020-12-26 DIAGNOSIS — E038 Other specified hypothyroidism: Secondary | ICD-10-CM

## 2020-12-26 DIAGNOSIS — R509 Fever, unspecified: Secondary | ICD-10-CM | POA: Diagnosis not present

## 2020-12-26 DIAGNOSIS — D472 Monoclonal gammopathy: Secondary | ICD-10-CM | POA: Diagnosis present

## 2020-12-26 DIAGNOSIS — Z923 Personal history of irradiation: Secondary | ICD-10-CM | POA: Diagnosis not present

## 2020-12-26 DIAGNOSIS — Z8673 Personal history of transient ischemic attack (TIA), and cerebral infarction without residual deficits: Secondary | ICD-10-CM | POA: Diagnosis not present

## 2020-12-26 DIAGNOSIS — Z95820 Peripheral vascular angioplasty status with implants and grafts: Secondary | ICD-10-CM

## 2020-12-26 DIAGNOSIS — K219 Gastro-esophageal reflux disease without esophagitis: Secondary | ICD-10-CM | POA: Diagnosis present

## 2020-12-26 DIAGNOSIS — G9340 Encephalopathy, unspecified: Secondary | ICD-10-CM | POA: Diagnosis present

## 2020-12-26 DIAGNOSIS — Z8249 Family history of ischemic heart disease and other diseases of the circulatory system: Secondary | ICD-10-CM

## 2020-12-26 DIAGNOSIS — I701 Atherosclerosis of renal artery: Secondary | ICD-10-CM | POA: Diagnosis present

## 2020-12-26 DIAGNOSIS — Z7982 Long term (current) use of aspirin: Secondary | ICD-10-CM

## 2020-12-26 DIAGNOSIS — Z7902 Long term (current) use of antithrombotics/antiplatelets: Secondary | ICD-10-CM

## 2020-12-26 DIAGNOSIS — R000 Tachycardia, unspecified: Secondary | ICD-10-CM

## 2020-12-26 DIAGNOSIS — R4182 Altered mental status, unspecified: Secondary | ICD-10-CM | POA: Diagnosis not present

## 2020-12-26 DIAGNOSIS — Z20822 Contact with and (suspected) exposure to covid-19: Secondary | ICD-10-CM | POA: Diagnosis present

## 2020-12-26 DIAGNOSIS — J984 Other disorders of lung: Secondary | ICD-10-CM | POA: Diagnosis not present

## 2020-12-26 DIAGNOSIS — R410 Disorientation, unspecified: Secondary | ICD-10-CM | POA: Diagnosis not present

## 2020-12-26 DIAGNOSIS — R1310 Dysphagia, unspecified: Principal | ICD-10-CM

## 2020-12-26 DIAGNOSIS — Z85818 Personal history of malignant neoplasm of other sites of lip, oral cavity, and pharynx: Secondary | ICD-10-CM

## 2020-12-26 DIAGNOSIS — Z79899 Other long term (current) drug therapy: Secondary | ICD-10-CM

## 2020-12-26 DIAGNOSIS — E785 Hyperlipidemia, unspecified: Secondary | ICD-10-CM | POA: Diagnosis present

## 2020-12-26 DIAGNOSIS — E890 Postprocedural hypothyroidism: Secondary | ICD-10-CM | POA: Diagnosis present

## 2020-12-26 DIAGNOSIS — M47816 Spondylosis without myelopathy or radiculopathy, lumbar region: Secondary | ICD-10-CM | POA: Diagnosis not present

## 2020-12-26 DIAGNOSIS — I639 Cerebral infarction, unspecified: Secondary | ICD-10-CM | POA: Diagnosis present

## 2020-12-26 LAB — URINALYSIS, ROUTINE W REFLEX MICROSCOPIC
Bilirubin Urine: NEGATIVE
Glucose, UA: NEGATIVE mg/dL
Hgb urine dipstick: NEGATIVE
Ketones, ur: NEGATIVE mg/dL
Leukocytes,Ua: NEGATIVE
Nitrite: NEGATIVE
Protein, ur: NEGATIVE mg/dL
Specific Gravity, Urine: 1.011 (ref 1.005–1.030)
pH: 6 (ref 5.0–8.0)

## 2020-12-26 LAB — CBC WITH DIFFERENTIAL/PLATELET
Abs Immature Granulocytes: 0 10*3/uL (ref 0.00–0.07)
Basophils Absolute: 0 10*3/uL (ref 0.0–0.1)
Basophils Relative: 1 %
Eosinophils Absolute: 0 10*3/uL (ref 0.0–0.5)
Eosinophils Relative: 0 %
HCT: 38.2 % — ABNORMAL LOW (ref 39.0–52.0)
Hemoglobin: 12.6 g/dL — ABNORMAL LOW (ref 13.0–17.0)
Lymphocytes Relative: 10 %
Lymphs Abs: 0.4 10*3/uL — ABNORMAL LOW (ref 0.7–4.0)
MCH: 33.3 pg (ref 26.0–34.0)
MCHC: 33 g/dL (ref 30.0–36.0)
MCV: 101.1 fL — ABNORMAL HIGH (ref 80.0–100.0)
Monocytes Absolute: 0.1 10*3/uL (ref 0.1–1.0)
Monocytes Relative: 3 %
Neutro Abs: 3.8 10*3/uL (ref 1.7–7.7)
Neutrophils Relative %: 86 %
Platelets: 130 10*3/uL — ABNORMAL LOW (ref 150–400)
RBC: 3.78 MIL/uL — ABNORMAL LOW (ref 4.22–5.81)
RDW: 14.4 % (ref 11.5–15.5)
WBC: 4.4 10*3/uL (ref 4.0–10.5)
nRBC: 0 % (ref 0.0–0.2)
nRBC: 0 /100 WBC

## 2020-12-26 LAB — COMPREHENSIVE METABOLIC PANEL
ALT: 30 U/L (ref 0–44)
AST: 30 U/L (ref 15–41)
Albumin: 3.1 g/dL — ABNORMAL LOW (ref 3.5–5.0)
Alkaline Phosphatase: 32 U/L — ABNORMAL LOW (ref 38–126)
Anion gap: 6 (ref 5–15)
BUN: 18 mg/dL (ref 8–23)
CO2: 29 mmol/L (ref 22–32)
Calcium: 8.4 mg/dL — ABNORMAL LOW (ref 8.9–10.3)
Chloride: 104 mmol/L (ref 98–111)
Creatinine, Ser: 1.21 mg/dL (ref 0.61–1.24)
GFR, Estimated: >60 mL/min (ref >60)
Glucose, Bld: 146 mg/dL — ABNORMAL HIGH (ref 70–99)
Potassium: 4.1 mmol/L (ref 3.5–5.1)
Sodium: 139 mmol/L (ref 135–145)
Total Bilirubin: 0.8 mg/dL (ref 0.3–1.2)
Total Protein: 6.3 g/dL — ABNORMAL LOW (ref 6.5–8.1)

## 2020-12-26 LAB — RESP PANEL BY RT-PCR (FLU A&B, COVID) ARPGX2
Influenza A by PCR: NEGATIVE
Influenza B by PCR: NEGATIVE
SARS Coronavirus 2 by RT PCR: NEGATIVE

## 2020-12-26 LAB — LACTIC ACID, PLASMA
Lactic Acid, Venous: 2 mmol/L (ref 0.5–1.9)
Lactic Acid, Venous: 2.5 mmol/L (ref 0.5–1.9)
Lactic Acid, Venous: 0.9 mmol/L (ref 0.5–1.9)

## 2020-12-26 MED ORDER — LACTATED RINGERS IV BOLUS (SEPSIS)
500.0000 mL | Freq: Once | INTRAVENOUS | Status: AC
  Administered 2020-12-26: 500 mL via INTRAVENOUS

## 2020-12-26 MED ORDER — TICAGRELOR 90 MG PO TABS
90.0000 mg | ORAL_TABLET | Freq: Two times a day (BID) | ORAL | Status: DC
  Administered 2020-12-27 – 2020-12-30 (×8): 90 mg via ORAL
  Filled 2020-12-26 (×8): qty 1

## 2020-12-26 MED ORDER — ACETAMINOPHEN 325 MG PO TABS
650.0000 mg | ORAL_TABLET | Freq: Four times a day (QID) | ORAL | Status: DC | PRN
  Administered 2020-12-27: 650 mg via ORAL
  Filled 2020-12-26: qty 2

## 2020-12-26 MED ORDER — ASPIRIN EC 81 MG PO TBEC
81.0000 mg | DELAYED_RELEASE_TABLET | Freq: Every day | ORAL | Status: DC
  Administered 2020-12-27 – 2020-12-30 (×4): 81 mg via ORAL
  Filled 2020-12-26 (×4): qty 1

## 2020-12-26 MED ORDER — METRONIDAZOLE IN NACL 5-0.79 MG/ML-% IV SOLN
500.0000 mg | Freq: Three times a day (TID) | INTRAVENOUS | Status: DC
Start: 2020-12-26 — End: 2020-12-30
  Administered 2020-12-27 – 2020-12-30 (×11): 500 mg via INTRAVENOUS
  Filled 2020-12-26 (×11): qty 100

## 2020-12-26 MED ORDER — IOHEXOL 300 MG/ML  SOLN
100.0000 mL | Freq: Once | INTRAMUSCULAR | Status: AC | PRN
  Administered 2020-12-26: 100 mL via INTRAVENOUS

## 2020-12-26 MED ORDER — ROSUVASTATIN CALCIUM 20 MG PO TABS
20.0000 mg | ORAL_TABLET | Freq: Every day | ORAL | Status: DC
  Administered 2020-12-27 – 2020-12-30 (×4): 20 mg via ORAL
  Filled 2020-12-26 (×4): qty 1

## 2020-12-26 MED ORDER — ENOXAPARIN SODIUM 40 MG/0.4ML ~~LOC~~ SOLN
40.0000 mg | Freq: Every day | SUBCUTANEOUS | Status: DC
  Administered 2020-12-27 – 2020-12-30 (×4): 40 mg via SUBCUTANEOUS
  Filled 2020-12-26 (×4): qty 0.4

## 2020-12-26 MED ORDER — LACTATED RINGERS IV SOLN: INTRAVENOUS | Status: DC

## 2020-12-26 MED ORDER — SODIUM CHLORIDE 0.9 % IV SOLN
2.0000 g | Freq: Once | INTRAVENOUS | Status: AC
  Administered 2020-12-26: 2 g via INTRAVENOUS
  Filled 2020-12-26: qty 2

## 2020-12-26 MED ORDER — SENNOSIDES-DOCUSATE SODIUM 8.6-50 MG PO TABS: 1.0000 | ORAL_TABLET | Freq: Every evening | ORAL | Status: DC | PRN

## 2020-12-26 MED ORDER — LACTATED RINGERS IV BOLUS (SEPSIS)
1000.0000 mL | Freq: Once | INTRAVENOUS | Status: AC
  Administered 2020-12-26: 1000 mL via INTRAVENOUS

## 2020-12-26 MED ORDER — SODIUM CHLORIDE 0.9 % IV SOLN
2.0000 g | Freq: Two times a day (BID) | INTRAVENOUS | Status: DC
  Administered 2020-12-27: 2 g via INTRAVENOUS
  Filled 2020-12-26: qty 2

## 2020-12-26 MED ORDER — ACETAMINOPHEN 650 MG RE SUPP: 650.0000 mg | Freq: Four times a day (QID) | RECTAL | Status: DC | PRN

## 2020-12-26 MED ORDER — VANCOMYCIN HCL 1500 MG/300ML IV SOLN
1500.0000 mg | INTRAVENOUS | Status: DC
  Administered 2020-12-27: 1500 mg via INTRAVENOUS
  Filled 2020-12-26 (×2): qty 300

## 2020-12-26 MED ORDER — VANCOMYCIN HCL 1500 MG/300ML IV SOLN
1500.0000 mg | Freq: Once | INTRAVENOUS | Status: AC
  Administered 2020-12-26: 1500 mg via INTRAVENOUS
  Filled 2020-12-26 (×2): qty 300

## 2020-12-26 MED ORDER — LEVOTHYROXINE SODIUM 100 MCG PO TABS
100.0000 ug | ORAL_TABLET | Freq: Every day | ORAL | Status: DC
  Administered 2020-12-27 – 2020-12-30 (×4): 100 ug via ORAL
  Filled 2020-12-26 (×4): qty 1

## 2020-12-26 MED ORDER — HALOPERIDOL LACTATE 5 MG/ML IJ SOLN
2.0000 mg | Freq: Four times a day (QID) | INTRAMUSCULAR | Status: DC | PRN
  Administered 2020-12-27 – 2020-12-28 (×2): 2 mg via INTRAVENOUS
  Filled 2020-12-26 (×3): qty 1

## 2020-12-26 NOTE — ED Notes (Signed)
Patient transported to CT 

## 2020-12-26 NOTE — H&P (Signed)
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 lung bases, possibly edema or pneumonia. Electronically Signed   By: Lucienne Capers M.D.   On: 12/26/2020 19:05    EKG: Independently reviewed. Junctional tachycardia, rate 116, RBBB, LAFB, LVH.    Assessment/Plan  1. Severe sepsis secondary to pneumonia; acute hypoxic respiratory failure   - Patient with dysphagia and frequent coughing while eating presents with acute-onset  rigors, confusion, and lethargy and is found to have high fever, elevated lactate, vacuolated neutrophils, and multifocal pneumonia with new supplemental O2 requirement  - Blood cultures collected in ED, 30 cc/kg LR bolus was given, and he was started on vancomycin and cefepime   - Check MRSA pcr, strep pneumo and legionella antigens, and sputum culture, trend procalcitonin and lactate, add Flagyl for suspected aspiration, and follow cultures and clinical course    2. Acute encephalopathy; dementia  - Patient with baseline dementia developed acute worsening in confusion today, likely secondary to high fever/sepsis  - Continue to treat sepsis as above, institute delirium precautions    3. History of CVA  - Admitted 12/19/20 with acute right MCA infarction with right ICA occlusion and underwent right ICA recannulization with thrombectomy and stenting  - Continue statin, ASA, and Brilinta   4. Hypothyroidism  - Continue Synthroid    5. Thrombocytopenia; MGUS  - Platelets 130k on admission, similar to priors    6. Dysphagia  - SLP consult   7. CKD II - Stable, will monitor   DVT prophylaxis: Lovenox  Code Status: Full  Level of Care: Level of care: Progressive Family Communication: Wife updated by phone  Disposition Plan:  Patient is from: Home  Anticipated d/c is to: TBD Anticipated d/c date is: 12/30/20 Patient currently: Critically-ill  Consults called: None  Admission status: Inpatient     Vianne Bulls, MD [facility]  12/26/2020, 11:30 PM

## 2020-12-26 NOTE — ED Provider Notes (Signed)
Patient placed in Quick Look pathway, seen and evaluated   Chief Complaint: decreased responsiveness  HPI:   Fever at home   ROS: pt unable  Physical Exam:   Gen: No distress  Neuro: Awake and Alert  Skin: Warm    Focused Exam: Lungs rhochi and rales    I am concerned pt is septic   Initiation of care has begun. The patient has been counseled on the process, plan, and necessity for staying for the completion/evaluation, and the remainder of the medical screening examination   Sidney Ace 12/26/20 1751    Blanchie Dessert, MD 12/26/20 2303

## 2020-12-26 NOTE — Progress Notes (Signed)
Pharmacy Antibiotic Note  George Bradley is a 80 y.o. male admitted on 12/26/2020 with sepsis.  Pharmacy has been consulted for vancomycin and cefepime dosing.  Plan: Vancomycin 1500 mg IV x1, then 1500 mg IV q24h (Est AUC 518) Cefepime 2g IV q12h - F/u clinical improvement and renal function - F/u culture data, LOT, and de-escalation     Temp (24hrs), Avg:104.6 F (40.3 C), Min:104.6 F (40.3 C), Max:104.6 F (40.3 C)  Recent Labs  Lab 12/19/20 2314 12/20/20 0240  WBC 12.5* 13.6*  CREATININE 1.04 1.03    Estimated Creatinine Clearance: 64.6 mL/min (by C-G formula based on SCr of 1.03 mg/dL).    Allergies  Allergen Reactions  . Zetia [Ezetimibe] Other (See Comments)    "made me feel bad"    Antimicrobials this admission: Cefepime 4/6 >> Vancomycin 4/6 >>   Microbiology results: 4/6 Bcx: sent 4/6 Ucx: sent  Thank you for allowing pharmacy to be a part of this patient's care.
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
## 2020-12-26 NOTE — ED Triage Notes (Signed)
Pt had a blocked coratid and got a stent placed last week. Today pt comes by EMS due to weakness, fever. Axox1. Pts baseline is orientated x4. Pt received 300 of NS. EMS states pts initial HR was 140 and O2 was 77%. Pt is currently on 4L of O2.

## 2020-12-26 NOTE — Patient Instructions (Signed)
Preventing Cerebrovascular Disease Cerebrovascular disease affects the arteries that supply the brain. Any condition that blocks or disrupts blood flow to the brain can cause cerebrovascular disease. When brain cells lose blood supply, they start to die within minutes, which results in a stroke. Stroke is the main danger of cerebrovascular disease. Stroke is a leading cause of death and disability. Many conditions can cause cerebrovascular disease and stroke. Making diet and lifestyle changes to prevent these conditions lowers your risk of cerebrovascular disease. How can making changes to prevent cerebrovascular disease affect me? Making these changes lowers your risk of many conditions that can cause cerebrovascular disease and stroke, including:  Atherosclerosis. This is narrowing and hardening of an artery, which happens when fat, cholesterol, calcium, or other substances (plaque) build up inside an artery. Plaque reduces blood flow through the artery.  High blood pressure. This increases the risk of building up plaque in your arteries or bleeding inside the brain, which can lead to stroke. By making these changes, you can also improve your overall health and quality of life. What can increase my risk of developing cerebrovascular disease? Certain factors make you more likely to develop cerebrovascular disease. Some of these factors are things that you cannot control, including:  Being over 74 years of age.  Being male. Strokes happen more often in women.  Family history. Having a parent or sibling who had a stroke before age 70 increases your risk of a stroke.  Having certain health conditions, such as: ? Diabetes. ? High blood pressure. ? Heart disease. ? Atherosclerosis. ? High cholesterol. ? Sleep apnea. ? Sickle cell disease.  Having a personal history of transient ischemic attack or a prior stroke. Other risk factors are things that you can control, including:  Being  overweight.  Using tobacco products.  Not being physically active.  Eating a high-fat diet.  Alcohol or drug abuse. Talk with your health care provider about your risk for cerebrovascular disease. Work with your health care provider to manage any diseases that you have that may contribute to cerebrovascular disease. Your health care provider may prescribe medicines to help prevent major causes of cerebrovascular disease. What actions can I take to prevent this? General instructions  If directed by your health care provider, check your blood pressure regularly. Track your blood pressure and report the readings to your health care provider.  If you are overweight, ask your health care provider to recommend a weight-loss plan for you. Losing 5-10 lb (2.2-4.5 kg) can reduce your risk of diabetes, atherosclerosis, and high blood pressure. Nutrition  Eat more fruits, vegetables, and whole grains.  Reduce how much saturated fat you eat. To do this, eat less red meat and fewer full-fat dairy products.  Eat healthy proteins instead of red meat. Healthy proteins include: ? Fish. Eat fish that contains heart-healthy omega-3 fatty acids. Try to eat these twice a week. Examples include salmon, albacore tuna, mackerel, and herring. ? Chicken and [place]. ? Nuts and seeds. ? Low-fat or nonfat yogurt.  Avoid precooked or cured meat, such as bacon, sausages, and meat loaves.  Avoid foods that contain: ? A lot of sugar, such as sweets and drinks with added sugar. ? A lot of salt (sodium). Avoid adding extra salt to your food, as told by your health care provider. ? Trans fats (trans-fatty acids), such as margarine and fats in baked goods. Trans fats may be listed as "partially hydrogenated oils" on food labels.  Check food labels to see how  much sodium, sugar, and trans fats are in foods.  Use vegetable oils that contain low amounts of saturated fat, such as olive oil or canola oil.    Lifestyle  Exercise for 30?60 minutes on most days, or as much as told by your health care provider. Do moderate-intensity exercise, such as brisk walking, bicycling, and water aerobics. Ask your health care provider which activities are safe for you.  Do not use any products that contain nicotine or tobacco, such as cigarettes, e-cigarettes, and chewing tobacco. If you need help quitting, ask your health care provider.  Do not drink alcohol if: ? Your health care provider tells you not to drink. ? You are pregnant, may be pregnant, or are planning to become pregnant.  If you drink alcohol: ? Limit how much you use to:  0-1 drink a day for women.  0-2 drinks a day for men. ? Be aware of how much alcohol is in your drink. In the [place] one drink equals one 12 oz bottle of beer (355 mL), one 5 oz glass of wine (148 mL), or one 1 oz glass of hard liquor (44 mL). Where to find more information Learn more about preventing cerebrovascular disease from:  [place], Lung, and [place]: https://wilson-eaton.com/  Centers for Disease Control and Prevention: [place].si  American Stroke Association: www.stroke.org Contact a health care provider if: You develop any of the following symptoms:  Headaches that keep coming back (chronic headaches).  Nausea.  Vision problems.  Increased sensitivity to noise or light.  Depression or mood swings.  Anxiety or irritability.  Memory problems.  Difficulty concentrating or paying attention.  Sleep problems.  Feeling tired all the time. Get help right away if you:  Have a partial or total loss of consciousness.  Are taking blood thinners and you fall or you experience a minor injury to the head.  Have a bleeding disorder and you fall or you experience minor trauma to the head.  Have any symptoms of a stroke. "BE FAST" is an easy way to remember the main warning signs of a stroke: ? B - Balance. Signs are dizziness, sudden  trouble walking, or loss of balance. ? E - Eyes. Signs are trouble seeing or a sudden change in vision. ? F - Face. Signs are sudden weakness or numbness of the face, or the face or eyelid drooping on one side. ? A - Arms. Signs are weakness or numbness in an arm. This happens suddenly and usually on one side of the body. ? S - Speech. Signs are sudden trouble speaking, slurred speech, or trouble understanding what people say. ? T - Time. Time to call emergency services. Write down what time symptoms started.  Have other signs of a stroke, such as: ? A sudden, severe headache with no known cause. ? Nausea or vomiting. ? Seizure. These symptoms may represent a serious problem that is an emergency. Do not wait to see if the symptoms will go away. Get medical help right away. Call your local emergency services (911 in the [place] Do not drive yourself to the hospital. Summary  Cerebrovascular disease is a condition that affects the arteries that supply the brain. Stroke is the main danger of cerebrovascular disease.  Common causes of cerebrovascular disease include atherosclerosis and high blood pressure.  Making diet and lifestyle changes can reduce your risk of cerebrovascular disease.  Work with your health care provider to manage any diseases that you have that may contribute to cerebrovascular  disease. This information is not intended to replace advice given to you by your health care provider. Make sure you discuss any questions you have with your health care provider. Document Revised: 08/23/2019 Document Reviewed: 08/23/2019 Elsevier Patient Education  2021 [employer].

## 2020-12-26 NOTE — Sepsis Progress Note (Signed)
Following for Code Sepsis  

## 2020-12-26 NOTE — Sepsis Progress Note (Signed)
Notified provider of need to order repeat lactic acid. Received verbal order.

## 2020-12-26 NOTE — Patient Outreach (Signed)
[facility]) Care Management  12/26/2020  TAAVI HOOSE [birth year] [id number]   RED ON EMMI ALERT - Stroke Day # 3 Date: 4/5 Red Alert Reason: Feeling worse overall   Outreach attempt #1, successful.  Identity verified.  This care manager introduced self and stated purpose of call.  [facility] care management services explained.    Member denies feeling worse, state he does not recall taking the EMMI call yesterday.  Report his wife may have answered questions for him.  He admits that he does have some dizziness and intermittent headaches, particularly in the morning but that eases throughout the day.  He does not have a need for PT/OT, denies any lingering effects from stroke.  Has follow up appointment with PCP on 4/8 and with cardiology on 5/19.  Does not recall when his neurology appointment is but state he wife will call to schedule if he does not have one.  Only medication change was addition of Brilinta, report he is taking as instructed.  Offered [facility] involvement for ongoing management of chronic conditions, state his wife worked in the medical field for 40 years and will be able to help him.  However, he does agree to receiving brochure and a follow up call.  Plan: RN CM will send stroke recovery education and [facility] brochure.  Will follow up within the next 2 weeks to again offer engagement with [facility] and resources.  Valente David, [place], MSN [place] [phone]

## 2020-12-27 LAB — PROCALCITONIN
Procalcitonin: 0.19 ng/mL
Procalcitonin: 1.36 ng/mL

## 2020-12-27 LAB — BASIC METABOLIC PANEL
Anion gap: 7 (ref 5–15)
BUN: 12 mg/dL (ref 8–23)
CO2: 27 mmol/L (ref 22–32)
Calcium: 8.7 mg/dL — ABNORMAL LOW (ref 8.9–10.3)
Chloride: 105 mmol/L (ref 98–111)
Creatinine, Ser: 0.96 mg/dL (ref 0.61–1.24)
GFR, Estimated: >60 mL/min (ref >60)
Glucose, Bld: 115 mg/dL — ABNORMAL HIGH (ref 70–99)
Potassium: 3.6 mmol/L (ref 3.5–5.1)
Sodium: 139 mmol/L (ref 135–145)

## 2020-12-27 LAB — CBC
HCT: 36 % — ABNORMAL LOW (ref 39.0–52.0)
Hemoglobin: 12.2 g/dL — ABNORMAL LOW (ref 13.0–17.0)
MCH: 33.4 pg (ref 26.0–34.0)
MCHC: 33.9 g/dL (ref 30.0–36.0)
MCV: 98.6 fL (ref 80.0–100.0)
Platelets: 129 10*3/uL — ABNORMAL LOW (ref 150–400)
RBC: 3.65 MIL/uL — ABNORMAL LOW (ref 4.22–5.81)
RDW: 14.3 % (ref 11.5–15.5)
WBC: 7.8 10*3/uL (ref 4.0–10.5)
nRBC: 0 % (ref 0.0–0.2)

## 2020-12-27 LAB — PROTIME-INR
INR: 1.2 (ref 0.8–1.2)
Prothrombin Time: 14.3 seconds (ref 11.4–15.2)

## 2020-12-27 LAB — LACTIC ACID, PLASMA: Lactic Acid, Venous: 1.1 mmol/L (ref 0.5–1.9)

## 2020-12-27 LAB — STREP PNEUMONIAE URINARY ANTIGEN: Strep Pneumo Urinary Antigen: NEGATIVE

## 2020-12-27 LAB — APTT: aPTT: 32 seconds (ref 24–36)

## 2020-12-27 MED ORDER — METOPROLOL TARTRATE 5 MG/5ML IV SOLN: 5.0000 mg | Freq: Once | INTRAVENOUS | Status: DC

## 2020-12-27 MED ORDER — HYDRALAZINE HCL 20 MG/ML IJ SOLN
10.0000 mg | Freq: Four times a day (QID) | INTRAMUSCULAR | Status: DC | PRN
  Administered 2020-12-27 – 2020-12-29 (×3): 10 mg via INTRAVENOUS
  Filled 2020-12-27 (×3): qty 1

## 2020-12-27 MED ORDER — SODIUM CHLORIDE 0.9 % IV SOLN
2.0000 g | Freq: Three times a day (TID) | INTRAVENOUS | Status: DC
  Administered 2020-12-27 – 2020-12-28 (×3): 2 g via INTRAVENOUS
  Filled 2020-12-27 (×3): qty 2

## 2020-12-27 MED ORDER — GABAPENTIN 300 MG PO CAPS
900.0000 mg | ORAL_CAPSULE | Freq: Three times a day (TID) | ORAL | Status: DC
  Administered 2020-12-27 – 2020-12-30 (×8): 900 mg via ORAL
  Filled 2020-12-27 (×8): qty 3

## 2020-12-27 MED ORDER — DONEPEZIL HCL 5 MG PO TABS
5.0000 mg | ORAL_TABLET | Freq: Every day | ORAL | Status: DC
  Administered 2020-12-27 – 2020-12-29 (×3): 5 mg via ORAL
  Filled 2020-12-27 (×3): qty 1

## 2020-12-27 MED ORDER — LORAZEPAM 1 MG PO TABS
1.0000 mg | ORAL_TABLET | Freq: Three times a day (TID) | ORAL | Status: DC
  Administered 2020-12-27 – 2020-12-30 (×8): 1 mg via ORAL
  Filled 2020-12-27 (×8): qty 1

## 2020-12-27 MED ORDER — MELATONIN 3 MG PO TABS
3.0000 mg | ORAL_TABLET | Freq: Every day | ORAL | Status: DC
  Administered 2020-12-27 – 2020-12-29 (×3): 3 mg via ORAL
  Filled 2020-12-27 (×3): qty 1

## 2020-12-27 MED ORDER — METOPROLOL TARTRATE 5 MG/5ML IV SOLN
5.0000 mg | Freq: Once | INTRAVENOUS | Status: AC
  Administered 2020-12-27: 5 mg via INTRAVENOUS
  Filled 2020-12-27: qty 5

## 2020-12-27 MED ORDER — BUSPIRONE HCL 5 MG PO TABS: 5.0000 mg | ORAL_TABLET | Freq: Two times a day (BID) | ORAL | Status: DC | PRN

## 2020-12-27 NOTE — ED Notes (Signed)
Change pts sheets, and gown.

## 2020-12-27 NOTE — Progress Notes (Signed)
PHARMACY NOTE:  ANTIMICROBIAL RENAL DOSAGE ADJUSTMENT  Current antimicrobial regimen includes a mismatch between antimicrobial dosage and estimated renal function.  As per policy approved by the Pharmacy & Therapeutics and Medical Executive Committees, the antimicrobial dosage will be adjusted accordingly.  Current antimicrobial dosage:  Cefepime 2g q12hr  Indication: sepsis  Renal Function:  Estimated Creatinine Clearance: 69.4 mL/min (by C-G formula based on SCr of 0.96 mg/dL). []      On intermittent HD, scheduled: []      On CRRT    Antimicrobial dosage has been changed to:  Cefepime 2g q8hr   Thank you for allowing pharmacy to be a part of this patient's care.  Esmond Plants, [facility] 12/27/2020 10:40 AM

## 2020-12-27 NOTE — Progress Notes (Signed)
PROGRESS NOTE    George Bradley  NWG:[id number] DOB: 1941/03/21 DOA: 12/26/2020 PCP: [name], MD   Brief Narrative: This 80 y.o. 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  Patient's wife was reached by phone and assists with the history.  Patient was reportedly in his usual state this morning, did some exercise at home, was left home alone while his wife went to the store, and when she returned an hour later he was noted to have shaking chills, increased confusion, and lethargy.  He had not been complaining of anything earlier in the day.  He has chronic dysphagia and often coughs and chokes while eating or drinking.  EMS was called and found the patient to have oxygen saturation of 77% and heart rate 140.  He was treated with 300 cc of saline and 4 L/min supplemental oxygen prior to arrival in the ED.  ED Course: Upon arrival to the ED, patient is found to be febrile to 40.3 C, saturating mid 90s on 4 L/min of supplemental oxygen, tachypneic, tachycardic, and with blood pressure 125/71.  EKG features junctional tachycardia.  Chest x-ray notable for hazy infiltration involving bilateral bases.  Chemistry panel is unremarkable.  CBC with slight microcytic anemia, mild thrombocytopenia, and vacuolated neutrophils.  Lactic acid was 2.0 and then 2.5.  COVID-19 PCR is negative.  CT chest, abdomen, and pelvis is concerning for multifocal pneumonia.  Blood and urine cultures were collected in the ED, 30 cc/kg LR bolus was given, and the patient was started on vancomycin and cefepime.  Assessment & Plan:   Principal Problem:   Severe sepsis ([place]) Active Problems:    Hypothyroidism   Thrombocytopenia (HCC)   Dysphagia   MGUS (monoclonal gammopathy of unknown significance)   CVA (cerebral vascular accident) ([place])   Acute respiratory failure with hypoxia (HCC)   Aspiration pneumonia (HCC)   Dementia ([place])   Acute encephalopathy   Severe sepsis secondary to pneumonia;  acute hypoxic respiratory failure - Patient with dysphagia and frequent coughing while eating presents with acute-onset rigors, confusion, and lethargy and is found to have high fever, elevated lactate, elevated neutrophils, and multifocal pneumonia with new supplemental O2 requirement  - Patient was started on empiric vancomycin and cefepime . Follow blood cultures and urine cultures. - Check MRSA pcr, strep pneumo and legionella antigens, and sputum culture, trend procalcitonin and lactate, add Flagyl for suspected aspiration, and follow cultures and clinical course . Patient reports feeling better.  2. Acute encephalopathy; dementia > resolved - Patient with baseline dementia developed acute worsening in confusion today, likely secondary to high fever/sepsis  - Continue to treat sepsis as above, institute delirium precautions    3. History of CVA  - Admitted 12/19/20 with acute right MCA infarction with right ICA occlusion and underwent right ICA recannulization with thrombectomy and stenting  - Continue statin, ASA, and Brilinta   4. Hypothyroidism  - Continue Synthroid    5. Thrombocytopenia; MGUS  - Platelets 130k on admission, similar to priors    6. Dysphagia  - SLP consult   7. CKD II - Stable, will monitor    DVT prophylaxis:  Lovenox Code Status: Full code. Family Communication:  No family at bed  side. Disposition Plan:  Status is: Inpatient  Remains inpatient appropriate because:Inpatient level of care appropriate due to severity of illness   Dispo: The patient is from: Home              Anticipated d/c is to: Home              Patient currently  is not medically stable to d/c.   Difficult to place patient No  Consultants:   None  Procedures:  Antimicrobials:  Anti-infectives (From admission, onward)   Start     Dose/Rate Route Frequency Ordered Stop   12/27/20 2000  vancomycin (VANCOREADY) IVPB 1500 mg/300 mL        1,500 mg 150 mL/hr over 120 Minutes Intravenous Every 24 hours 12/26/20 2006     12/27/20 1400  ceFEPIme (MAXIPIME) 2 g in sodium chloride 0.9 % 100 mL IVPB        2 g 200 mL/hr over 30 Minutes Intravenous Every 8 hours 12/27/20 1040     12/27/20 0700  ceFEPIme (MAXIPIME) 2 g in sodium chloride 0.9 % 100 mL IVPB  Status:  Discontinued        2 g 200 mL/hr over 30 Minutes Intravenous Every 12 hours 12/26/20 2006 12/27/20 1040   12/26/20 2345  metroNIDAZOLE (FLAGYL) IVPB 500 mg        500 mg 100 mL/hr over 60 Minutes Intravenous Every 8 hours 12/26/20 2329     12/26/20 1845  vancomycin (VANCOREADY) IVPB 1500 mg/300 mL        1,500 mg 150 mL/hr over 120 Minutes Intravenous  Once 12/26/20 1836 12/26/20 2148   12/26/20 1845  ceFEPIme (MAXIPIME) 2 g in sodium chloride 0.9 % 100 mL IVPB        2 g 200 mL/hr over 30 Minutes Intravenous  Once 12/26/20 1836 12/26/20 1951      Subjective: Patient was seen and examined at bedside.  Overnight events noted.  Patient reports feeling much better and wants to be discharged.  Explained to him in detail that he is having pneumonia and will have to give him IV antibiotics.  He understood and agreed to stay.  Objective: Vitals:   12/27/20 0717 12/27/20 1100 12/27/20 1248 12/27/20 1630  BP: (!) 184/80 (!) 193/37 (!) 144/75 (!) 167/86  Pulse: 79 77  98  Resp: 17 14  18   Temp: 98.7 F (37.1 C) 98.8 F (37.1 C)  98.4 F (36.9 C)  TempSrc: Oral Oral  Oral  SpO2: 92% 93%  94%  Weight:      Height:        Intake/Output Summary (Last 24 hours) at 12/27/2020 1640 Last data filed at 12/27/2020 1301 Gross per 24 hour  Intake 3137.02 ml  Output 1750 ml  Net 1387.02 ml    Filed Weights   12/27/20 0047 12/27/20 0255  Weight: 80.3 kg 82.4 kg    Examination:  General exam: Appears calm and comfortable, not in any acute distress. Respiratory system: Clear to auscultation. Respiratory effort normal. Cardiovascular system: S1 & S2 heard, RRR. No JVD, murmurs, rubs, gallops or clicks. No pedal edema. Gastrointestinal system: Abdomen is nondistended, soft and nontender. No organomegaly or masses felt.  Normal bowel sounds heard. Central nervous system: Alert and oriented. No focal neurological deficits. Extremities: Symmetric 5 x 5 power.  No edema, no cyanosis, no clubbing. Skin: No rashes, lesions or ulcers Psychiatry: Judgement and insight appear normal. Mood & affect appropriate.     Data  Reviewed: I have personally reviewed following labs and imaging studies  CBC: Recent Labs  Lab 12/26/20 1749 12/27/20 0310  WBC 4.4 7.8  NEUTROABS 3.8  --   HGB 12.6* 12.2*  HCT 38.2* 36.0*  MCV 101.1* 98.6  PLT 130* 093*   Basic Metabolic Panel: Recent Labs  Lab 12/26/20 1749 12/27/20 0310  NA 139 139  K 4.1 3.6  CL 104 105  CO2 29 27  GLUCOSE 146* 115*  BUN 18 12  CREATININE 1.21 0.96  CALCIUM 8.4* 8.7*   GFR: Estimated Creatinine Clearance: 69.4 mL/min (by C-G formula based on SCr of 0.96 mg/dL). Liver Function Tests: Recent Labs  Lab 12/26/20 1749  AST 30  ALT 30  ALKPHOS 32*  BILITOT 0.8  PROT 6.3*  ALBUMIN 3.1*   No results for input(s): LIPASE, AMYLASE in the last 168 hours. No results for input(s): AMMONIA in the last 168 hours. Coagulation Profile: Recent Labs  Lab 12/27/20 0310  INR 1.2   Cardiac Enzymes: No results for input(s): CKTOTAL, CKMB, CKMBINDEX, TROPONINI in the last 168 hours. BNP (last 3 results) No results for input(s): PROBNP in the last 8760 hours. HbA1C: No results for input(s): HGBA1C in the last 72 hours. CBG: No results for input(s): GLUCAP in the last 168 hours. Lipid Profile: No results for  input(s): CHOL, HDL, LDLCALC, TRIG, CHOLHDL, LDLDIRECT in the last 72 hours. Thyroid Function Tests: No results for input(s): TSH, T4TOTAL, FREET4, T3FREE, THYROIDAB in the last 72 hours. Anemia Panel: No results for input(s): VITAMINB12, FOLATE, FERRITIN, TIBC, IRON, RETICCTPCT in the last 72 hours. Sepsis Labs: Recent Labs  Lab 12/26/20 1749 12/26/20 2026 12/26/20 2314 12/27/20 0128 12/27/20 0310  PROCALCITON 0.19  --   --   --  1.36  LATICACIDVEN 2.0* 2.5* 0.9 1.1  --     Recent Results (from the past 240 hour(s))  Resp Panel by RT-PCR (Flu A&B, Covid) Nasopharyngeal Swab     Status: None   Collection Time: 12/19/20  3:30 PM   Specimen: Nasopharyngeal Swab; Nasopharyngeal(NP) swabs in vial transport medium  Result Value Ref Range Status   SARS Coronavirus 2 by RT PCR NEGATIVE NEGATIVE Final    Comment: (NOTE) SARS-CoV-2 target nucleic acids are NOT DETECTED.  The SARS-CoV-2 RNA is generally detectable in upper respiratory specimens during the acute phase of infection. The lowest concentration of SARS-CoV-2 viral copies this assay can detect is 138 copies/mL. A negative result does not preclude SARS-Cov-2 infection and should not be used as the sole basis for treatment or other patient management decisions. A negative result may occur with  improper specimen collection/handling, submission of specimen other than nasopharyngeal swab, presence of viral mutation(s) within the areas targeted by this assay, and inadequate number of viral copies(<138 copies/mL). A negative result must be combined with clinical observations, patient history, and epidemiological information. The expected result is Negative.  Fact Sheet for Patients:  EntrepreneurPulse.com.au  Fact Sheet for Healthcare Providers:  IncredibleEmployment.be  This test is no t yet approved or cleared by the [place] FDA and  has been authorized for detection and/or diagnosis  of SARS-CoV-2 by FDA under an Emergency Use Authorization (EUA). This EUA will remain  in effect (meaning this test can be used) for the duration of the COVID-19 declaration under Section 564(b)(1) of the Act, 21 U.S.C.section 360bbb-3(b)(1), unless the authorization is terminated  or revoked sooner.       Influenza A by PCR NEGATIVE NEGATIVE Final   Influenza B  by PCR NEGATIVE NEGATIVE Final    Comment: (NOTE) The Xpert Xpress SARS-CoV-2/FLU/RSV plus assay is intended as an aid in the diagnosis of influenza from Nasopharyngeal swab specimens and should not be used as a sole basis for treatment. Nasal washings and aspirates are unacceptable for Xpert Xpress SARS-CoV-2/FLU/RSV testing.  Fact Sheet for Patients: EntrepreneurPulse.com.au  Fact Sheet for Healthcare Providers: IncredibleEmployment.be  This test is not yet approved or cleared by the [place] FDA and has been authorized for detection and/or diagnosis of SARS-CoV-2 by FDA under an Emergency Use Authorization (EUA). This EUA will remain in effect (meaning this test can be used) for the duration of the COVID-19 declaration under Section 564(b)(1) of the Act, 21 U.S.C. section 360bbb-3(b)(1), unless the authorization is terminated or revoked.  Performed at [facility], [place] [address], [place], [place] [address]   MRSA PCR Screening     Status: None   Collection Time: 12/19/20 11:04 PM   Specimen: Nasal Mucosa; Nasopharyngeal  Result Value Ref Range Status   MRSA by PCR NEGATIVE NEGATIVE Final    Comment:        The GeneXpert MRSA Assay (FDA approved for NASAL specimens only), is one component of a comprehensive MRSA colonization surveillance program. It is not intended to diagnose MRSA infection nor to guide or monitor treatment for MRSA infections. Performed at [facility], [place] [address], [place], [place] [address]   Culture, blood (Routine  x 2)     Status: None (Preliminary result)   Collection Time: 12/26/20  6:28 PM   Specimen: BLOOD  Result Value Ref Range Status   Specimen Description BLOOD LEFT ANTECUBITAL  Final   Special Requests   Final    BOTTLES DRAWN AEROBIC AND ANAEROBIC Blood Culture adequate volume   Culture   Final    NO GROWTH < 24 HOURS Performed at [facility], [place] [address] [place], [place] [address]    Report Status PENDING  Incomplete  Culture, blood (Routine x 2)     Status: None (Preliminary result)   Collection Time: 12/26/20  6:38 PM   Specimen: BLOOD  Result Value Ref Range Status   Specimen Description BLOOD BLOOD RIGHT FOREARM  Final   Special Requests   Final    BOTTLES DRAWN AEROBIC AND ANAEROBIC Blood Culture results may not be optimal due to an inadequate volume of blood received in culture bottles   Culture   Final    NO GROWTH < 24 HOURS Performed at [facility], [place] [address] [place], [place] [address]    Report Status PENDING  Incomplete  Resp Panel by RT-PCR (Flu A&B, Covid) Urine, Clean Catch     Status: None   Collection Time: 12/26/20  8:27 PM   Specimen: Urine, Clean Catch; Nasopharyngeal(NP) swabs in vial transport medium  Result Value Ref Range Status   SARS Coronavirus 2 by RT PCR NEGATIVE NEGATIVE Final    Comment: (NOTE) SARS-CoV-2 target nucleic acids are NOT DETECTED.  The SARS-CoV-2 RNA is generally detectable in upper respiratory specimens during the acute phase of infection. The lowest concentration of SARS-CoV-2 viral copies this assay can detect is 138 copies/mL. A negative result does not preclude SARS-Cov-2 infection and should not be used as the sole basis for treatment or other patient management decisions. A negative result may occur with  improper specimen collection/handling, submission of specimen other than nasopharyngeal swab, presence of viral mutation(s) within the areas targeted by  this assay, and inadequate number of  viral copies(<138 copies/mL). A negative result must be combined with clinical observations, patient history, and epidemiological information. The expected result is Negative.  Fact Sheet for Patients:  EntrepreneurPulse.com.au  Fact Sheet for Healthcare Providers:  IncredibleEmployment.be  This test is no t yet approved or cleared by the [place] FDA and  has been authorized for detection and/or diagnosis of SARS-CoV-2 by FDA under an Emergency Use Authorization (EUA). This EUA will remain  in effect (meaning this test can be used) for the duration of the COVID-19 declaration under Section 564(b)(1) of the Act, 21 U.S.C.section 360bbb-3(b)(1), unless the authorization is terminated  or revoked sooner.       Influenza A by PCR NEGATIVE NEGATIVE Final   Influenza B by PCR NEGATIVE NEGATIVE Final    Comment: (NOTE) The Xpert Xpress SARS-CoV-2/FLU/RSV plus assay is intended as an aid in the diagnosis of influenza from Nasopharyngeal swab specimens and should not be used as a sole basis for treatment. Nasal washings and aspirates are unacceptable for Xpert Xpress SARS-CoV-2/FLU/RSV testing.  Fact Sheet for Patients: EntrepreneurPulse.com.au  Fact Sheet for Healthcare Providers: IncredibleEmployment.be  This test is not yet approved or cleared by the [place] FDA and has been authorized for detection and/or diagnosis of SARS-CoV-2 by FDA under an Emergency Use Authorization (EUA). This EUA will remain in effect (meaning this test can be used) for the duration of the COVID-19 declaration under Section 564(b)(1) of the Act, 21 U.S.C. section 360bbb-3(b)(1), unless the authorization is terminated or revoked.  Performed at [facility], [place] [address], [place], [place] [address]          Radiology Studies: CT CHEST ABDOMEN PELVIS W CONTRAST  Result Date: 12/26/2020 CLINICAL DATA:   Abdominal pain, fever EXAM: CT CHEST, ABDOMEN, AND PELVIS WITH CONTRAST TECHNIQUE: Multidetector CT imaging of the chest, abdomen and pelvis was performed following the standard protocol during bolus administration of intravenous contrast. CONTRAST:  100 mL Omnipaque 300 IV COMPARISON:  CT abdomen 08/17/2020. FINDINGS: CT CHEST FINDINGS Cardiovascular: Cardiomegaly. Prior CABG. Diffuse aortic atherosclerosis. No aneurysm. Mediastinum/Nodes: No mediastinal, hilar, or axillary adenopathy. Trachea and esophagus are unremarkable. Thyroid unremarkable. Lungs/Pleura: Airspace disease noted throughout the right middle lobe and both lower lobes most compatible with pneumonia. No effusions. Musculoskeletal: Chest wall soft tissues are unremarkable. No acute bony abnormality. CT ABDOMEN PELVIS FINDINGS Hepatobiliary: No focal hepatic abnormality. Gallbladder unremarkable. Pancreas: No focal abnormality or ductal dilatation. Spleen: No focal abnormality.  Normal size. Adrenals/Urinary Tract: No hydronephrosis or ureteral stones. Small cyst in the midpole of the right kidney. Adrenal glands and urinary bladder unremarkable. Stomach/Bowel: Large stool burden throughout the colon. No evidence of bowel obstruction. Appendix is normal. Vascular/Lymphatic: Aortoiliac atherosclerosis. No evidence of aneurysm or adenopathy. Reproductive: No visible focal abnormality. Other: No free fluid or free air. Musculoskeletal: No acute bony abnormality. Degenerative changes in the lumbar spine. IMPRESSION: Bilateral airspace disease throughout the right middle lobe and both lower lobes compatible with pneumonia. Large stool burden in the colon. Prior CABG.  Diffuse aortic atherosclerosis. Electronically Signed   By: Rolm Baptise M.D.   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 Today pt comes by EMS due to weakness, fever. EXAM: PORTABLE  CHEST 1 VIEW COMPARISON:  06/21/2020 FINDINGS: Postoperative changes in the mediastinum. Shallow inspiration. Heart size and pulmonary vascularity  are normal for technique. Hazy infiltrates in the lung bases, possibly edema or pneumonia. No pleural effusions. No pneumothorax. Mediastinal contours appear intact. IMPRESSION: Hazy infiltrates in the lung bases, possibly edema or pneumonia. Electronically Signed   By: Lucienne Capers M.D.   On: 12/26/2020 19:05   Scheduled Meds: . aspirin EC  81 mg Oral Daily  . enoxaparin (LOVENOX) injection  40 mg Subcutaneous Daily  . levothyroxine  100 mcg Oral QAC breakfast  . melatonin  3 mg Oral QHS  . rosuvastatin  20 mg Oral Daily  . ticagrelor  90 mg Oral BID   Continuous Infusions: . ceFEPime (MAXIPIME) IV 2 g (12/27/20 1432)  . metronidazole 500 mg (12/27/20 1512)  . vancomycin       LOS: 1 day    Time spent: 35 mins    Ashvin Adelson, MD [facility]   If 7PM-7AM, please contact night-coverage

## 2020-12-27 NOTE — Progress Notes (Signed)
Called by RN who reported BP elevated at 194-199/87-92. HR 120's.  No prn BP meds.  Ordered metoprolol 5 mg IV now.  Continue to monitor

## 2020-12-27 NOTE — Progress Notes (Signed)
Pt arrived on unit from ED about 0245. Pt vitals where taken. Pt has no belongings at bedside. Pt does not complain of pain. Bath given. Call light within reach.
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
## 2020-12-28 ENCOUNTER — Inpatient Hospital Stay (HOSPITAL_COMMUNITY): Payer: Medicare Other

## 2020-12-28 LAB — MRSA PCR SCREENING: MRSA by PCR: NEGATIVE

## 2020-12-28 LAB — CBC
HCT: 37.8 % — ABNORMAL LOW (ref 39.0–52.0)
Hemoglobin: 12.6 g/dL — ABNORMAL LOW (ref 13.0–17.0)
MCH: 32.7 pg (ref 26.0–34.0)
MCHC: 33.3 g/dL (ref 30.0–36.0)
MCV: 98.2 fL (ref 80.0–100.0)
Platelets: 146 10*3/uL — ABNORMAL LOW (ref 150–400)
RBC: 3.85 MIL/uL — ABNORMAL LOW (ref 4.22–5.81)
RDW: 14.4 % (ref 11.5–15.5)
WBC: 7.8 10*3/uL (ref 4.0–10.5)
nRBC: 0 % (ref 0.0–0.2)

## 2020-12-28 LAB — URINE CULTURE: Culture: <10000 — AB

## 2020-12-28 LAB — BASIC METABOLIC PANEL
Anion gap: 9 (ref 5–15)
BUN: 18 mg/dL (ref 8–23)
CO2: 26 mmol/L (ref 22–32)
Calcium: 9.1 mg/dL (ref 8.9–10.3)
Chloride: 105 mmol/L (ref 98–111)
Creatinine, Ser: 1.24 mg/dL (ref 0.61–1.24)
GFR, Estimated: 59 mL/min — ABNORMAL LOW (ref >60)
Glucose, Bld: 106 mg/dL — ABNORMAL HIGH (ref 70–99)
Potassium: 3.6 mmol/L (ref 3.5–5.1)
Sodium: 140 mmol/L (ref 135–145)

## 2020-12-28 LAB — PROCALCITONIN: Procalcitonin: 1.13 ng/mL

## 2020-12-28 MED ORDER — SODIUM CHLORIDE 0.9 % IV SOLN
2.0000 g | Freq: Two times a day (BID) | INTRAVENOUS | Status: DC
  Administered 2020-12-28 – 2020-12-30 (×4): 2 g via INTRAVENOUS
  Filled 2020-12-28 (×5): qty 2

## 2020-12-28 MED ORDER — HYDRALAZINE HCL 20 MG/ML IJ SOLN: 10.0000 mg | Freq: Four times a day (QID) | INTRAMUSCULAR | Status: DC | PRN

## 2020-12-28 MED ORDER — AMLODIPINE BESYLATE 5 MG PO TABS
5.0000 mg | ORAL_TABLET | Freq: Every day | ORAL | Status: DC
  Administered 2020-12-28 – 2020-12-30 (×3): 5 mg via ORAL
  Filled 2020-12-28 (×3): qty 1
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
## 2020-12-28 NOTE — Progress Notes (Addendum)
Modified Barium Swallow Progress Note  Patient Details  Name: George Bradley MRN: [id number] Date of Birth: 1941-08-20  Today's Date: 12/28/2020  Modified Barium Swallow completed.  Full report located under Chart Review in the Imaging Section.  Brief recommendations include the following:  Clinical Impression  Pt presents with pharyngeal dysphagia characterized by reduced lingual retraction, a pharyngeal delay, reduced anterior laryngeal movement, and reduced cricopharyngeal relaxation. He demonstrated vallecular residue, pyriform sinus residue, incomplete epiglottic, and reduced laryngeal vestibule closure. The swallow was often triggered with the head of the bolus at the level of the valleculae or pyriform sinuses with solids and liquids. Penetration (PAS 3, 5) was noted with thin liquids during the swallow and silent aspiration (PAS 8) was noted thereafter. Prompted coughing was ineffective in mobilizing or expelling the aspirate due to its weakness. Throat clearing and coughing did mobilize penetrated material, but did not effectively expel it from the larynx. Frequency of penetration (PAS 3) and subsequent silent aspiration (PAS 8) was reduced with nectar thick liquids, but laryngeal invasion was not eliminated. No functional benefit was noted with postural modifications or with use of an effortful swallow. Additionally, these strategies were intermittently noted to facilitate increased aspiration. Bolus formation was mildly impacted by xerostomia, but it was [facility] during the study. Pharyngeal residue was reduced with use of a liquid wash and with reduced bolus sizes. Reduced transit of the barium tablet (given with liquids) was noted at the level of the level of UES and through the upper thoracic esophagus, but movement was facilitated with useof boluses of puree. 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 Considering the fact that laryngeal invasion was not eliminated with use of thickened liquids, and that evidence shows that his risk of aspiration-related complications would be higher with aspiration of thickened liquids, it is recommended that his current diet of dysphagia 3 solids and nectar thick liquids be continued at this time with observance of swallowing precautions. SLP will follow for dysphagia treatment.   Swallow Evaluation Recommendations       SLP Diet Recommendations: Dysphagia 3 (Mech soft) solids;Thin liquid   Liquid Administration via: Cup;No straw   Medication Administration: Whole meds with puree   Supervision: Intermittent supervision to cue for compensatory strategies   Compensations: Slow rate;Small sips/bites;Follow solids with liquid   Postural Changes: Seated upright at 90 degrees   Oral Care Recommendations: Patient independent with oral care;Oral care QID     Dwana Garin I. Hardin Negus, [place], [place] Office number [phone] Pager [place] 12/28/2020,2:03 PM
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
## 2020-12-28 NOTE — Consult Note (Signed)
   [facility] Inpatient Consult   12/28/2020  George Bradley 1940/10/20 [id number]  Patient is currently in a pending status for [facility] for chronic disease management services.  Patient has been outreached by a [facility] Coordinator for an EMMI Stroke follow up call.   Plan: Currently, following for progress and to follow for disposition needs.    Of note, [facility] Care Management services does not replace or interfere with any services that are needed or arranged by inpatient [facility] care management team.  For additional questions or referrals please contact:  Natividad Brood, RN BSN [facility] Liaison  [phone] business mobile phone Toll free office [phone]  Fax number: [phone] [name]

## 2020-12-29 ENCOUNTER — Inpatient Hospital Stay (HOSPITAL_COMMUNITY): Payer: Medicare Other

## 2020-12-29 LAB — CBC
HCT: 43.9 % (ref 39.0–52.0)
Hemoglobin: 14.6 g/dL (ref 13.0–17.0)
MCH: 32.9 pg (ref 26.0–34.0)
MCHC: 33.3 g/dL (ref 30.0–36.0)
MCV: 98.9 fL (ref 80.0–100.0)
Platelets: 187 10*3/uL (ref 150–400)
RBC: 4.44 MIL/uL (ref 4.22–5.81)
RDW: 14.2 % (ref 11.5–15.5)
WBC: 7.6 10*3/uL (ref 4.0–10.5)
nRBC: 0 % (ref 0.0–0.2)

## 2020-12-29 LAB — BASIC METABOLIC PANEL
Anion gap: 10 (ref 5–15)
BUN: 21 mg/dL (ref 8–23)
CO2: 26 mmol/L (ref 22–32)
Calcium: 9.6 mg/dL (ref 8.9–10.3)
Chloride: 105 mmol/L (ref 98–111)
Creatinine, Ser: 1.24 mg/dL (ref 0.61–1.24)
GFR, Estimated: 59 mL/min — ABNORMAL LOW (ref >60)
Glucose, Bld: 95 mg/dL (ref 70–99)
Potassium: 3.7 mmol/L (ref 3.5–5.1)
Sodium: 141 mmol/L (ref 135–145)

## 2020-12-29 LAB — LEGIONELLA PNEUMOPHILA SEROGP 1 UR AG: L. pneumophila Serogp 1 Ur Ag: NEGATIVE

## 2020-12-29 MED ORDER — HALOPERIDOL LACTATE 5 MG/ML IJ SOLN: 2.0000 mg | Freq: Once | INTRAMUSCULAR | Status: DC
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
## 2020-12-29 NOTE — Progress Notes (Signed)
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 Regular textured diet and thin liquids recommended at that time with f/u from OP SLP services. He subsequently recieved services on 07/26/19 where ST trained in swallowing exercises and educated regarding swallow strategies and precautions. CT Chest  (12/26/2020) revealed bilateral airspace disease throughout the right middle lobe and both lower lobes compatible with pneumonia.  No data recorded Assessment / Plan / Recommendation [facility] IP CLINICAL IMPRESSIONS 12/28/2020 Clinical Impression Pt presents with pharyngeal dysphagia characterized by reduced lingual retraction, a pharyngeal delay, reduced anterior laryngeal movement, and reduced cricopharyngeal relaxation. He demonstrated vallecular residue, pyriform sinus residue, incomplete epiglottic, and reduced laryngeal vestibule closure. The swallow was often triggered with the head of the bolus at the level of the valleculae or pyriform sinuses with solids and liquids. Penetration (PAS 3, 5) was noted with thin liquids during the swallow and silent aspiration (PAS 8) was noted thereafter. Prompted coughing was ineffective in mobilizing or expelling the aspirate due to its weakness. Throat clearing and coughing did mobilize penetrated material, but did not effectively expel it from the larynx. Frequency of penetration (PAS 3) and subsequent silent aspiration (PAS 8) was reduced with nectar thick liquids, but laryngeal invasion was not eliminated. No functional benefit was noted with postural modifications or with use of an effortful swallow. Additionally, these strategies were intermittently noted to facilitate increased aspiration. Bolus formation was mildly impacted by xerostomia, but it was [facility] during the study. Pharyngeal residue was reduced with use of a liquid wash and with reduced bolus sizes.  Reduced transit of the barium tablet (given with liquids) was noted at the level of the level of UES and through the upper thoracic esophagus, but movement was facilitated with useof boluses of puree. 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 Considering the fact that laryngeal invasion was not eliminated with use of thickened liquids, and that evidence shows that his risk of aspiration-related complications would be higher with aspiration of thickened liquids, it is recommended that his current diet of dysphagia 3 solids and nectar thick liquids be continued at this time with observance of swallowing precautions. SLP will follow for dysphagia treatment. SLP Visit Diagnosis Dysphagia, pharyngeal phase (R13.13) Attention and concentration deficit following -- Frontal lobe and executive function deficit following -- Impact on safety and function Moderate aspiration risk   [facility] IP TREATMENT RECOMMENDATION 12/28/2020 Treatment Recommendations Therapy as outlined in treatment plan below   Prognosis 12/28/2020 Prognosis for Safe Diet Advancement Fair Barriers to Reach Goals Time post onset;Severity of deficits Barriers/Prognosis Comment -- [facility] IP DIET  RECOMMENDATION 12/28/2020 SLP Diet Recommendations Dysphagia 3 (Mech soft) solids;Thin liquid Liquid Administration via Cup;No straw Medication Administration Whole meds with puree Compensations Slow rate;Small sips/bites;Follow solids with liquid Postural Changes Seated upright at 90 degrees   [facility] IP OTHER RECOMMENDATIONS 12/28/2020 Recommended Consults -- Oral Care Recommendations Patient independent with oral care;Oral care QID Other Recommendations --   [facility] IP FOLLOW UP RECOMMENDATIONS 12/28/2020 Follow up Recommendations Outpatient SLP   [facility] IP FREQUENCY AND  DURATION 12/28/2020 Speech Therapy Frequency (ACUTE ONLY) min 2x/week Treatment Duration 2 weeks      [facility] IP ORAL PHASE 12/28/2020 Oral Phase WFL Oral - Pudding Teaspoon -- Oral - Pudding Cup -- Oral - Honey Teaspoon -- Oral - Honey Cup -- Oral - Nectar Teaspoon -- Oral - Nectar Cup -- Oral - Nectar Straw -- Oral - Thin Teaspoon -- Oral - Thin Cup -- Oral - Thin Straw -- Oral - Puree -- Oral - Mech Soft -- Oral - Regular -- Oral - Multi-Consistency -- Oral - Pill -- Oral Phase - Comment --  [facility] IP PHARYNGEAL PHASE 12/28/2020 Pharyngeal Phase Impaired Pharyngeal- Pudding Teaspoon -- Pharyngeal -- Pharyngeal- Pudding Cup -- Pharyngeal -- Pharyngeal- Honey Teaspoon -- Pharyngeal -- Pharyngeal- Honey Cup -- Pharyngeal -- Pharyngeal- Nectar Teaspoon -- Pharyngeal -- Pharyngeal- Nectar Cup Penetration/Apiration after swallow;Penetration/Aspiration during swallow;Trace aspiration;Reduced epiglottic inversion;Reduced anterior laryngeal mobility;Reduced airway/laryngeal closure;Delayed swallow initiation-vallecula;Pharyngeal residue - valleculae;Pharyngeal residue - pyriform Pharyngeal Material enters airway, CONTACTS cords and not ejected out;Material enters airway, remains ABOVE vocal cords and not ejected out;Material enters airway, passes BELOW cords without attempt by patient to eject out (silent aspiration) Pharyngeal- Nectar Straw Penetration/Apiration after swallow;Penetration/Aspiration during swallow;Trace aspiration;Reduced epiglottic inversion;Reduced anterior laryngeal mobility;Reduced airway/laryngeal closure;Delayed swallow initiation-vallecula;Pharyngeal residue - valleculae;Pharyngeal residue - pyriform Pharyngeal Material enters airway, remains ABOVE vocal cords and not ejected out;Material enters airway, CONTACTS cords and not ejected out;Material enters airway, passes BELOW cords without attempt by patient to eject out (silent aspiration) Pharyngeal- Thin Teaspoon -- Pharyngeal -- Pharyngeal- Thin Cup  Penetration/Apiration after swallow;Penetration/Aspiration during swallow;Trace aspiration;Reduced epiglottic inversion;Reduced anterior laryngeal mobility;Reduced airway/laryngeal closure;Delayed swallow initiation-vallecula;Pharyngeal residue - valleculae;Pharyngeal residue - pyriform Pharyngeal Material enters airway, remains ABOVE vocal cords and not ejected out;Material enters airway, CONTACTS cords and not ejected out;Material enters airway, passes BELOW cords without attempt by patient to eject out (silent aspiration) Pharyngeal- Thin Straw -- Pharyngeal -- Pharyngeal- Puree -- Pharyngeal -- Pharyngeal- Mechanical Soft -- Pharyngeal -- Pharyngeal- Regular Reduced epiglottic inversion;Reduced anterior laryngeal mobility;Reduced airway/laryngeal closure;Delayed swallow initiation-vallecula;Pharyngeal residue - valleculae;Pharyngeal residue - pyriform Pharyngeal -- Pharyngeal- Multi-consistency -- Pharyngeal -- Pharyngeal- Pill Reduced epiglottic inversion;Reduced anterior laryngeal mobility;Reduced airway/laryngeal closure;Delayed swallow initiation-vallecula;Pharyngeal residue - valleculae;Pharyngeal residue - pyriform;Pharyngeal residue - cp segment Pharyngeal -- Pharyngeal Comment --  CHL IP CERVICAL ESOPHAGEAL PHASE 12/28/2020 Cervical Esophageal Phase Impaired Pudding Teaspoon -- Pudding Cup -- Honey Teaspoon -- Honey Cup -- Nectar Teaspoon -- Nectar Cup -- Nectar Straw -- Thin Teaspoon -- Thin Cup -- Thin Straw -- Puree -- Mechanical Soft -- Regular -- Multi-consistency -- Pill Reduced cricopharyngeal relaxation Cervical Esophageal Comment -- Shanika I. Hardin Negus, [place], [place] Office number [phone] Pager [phone] Horton Marshall 12/28/2020, 2:21 PM              Scheduled Meds: . amLODipine  5 mg Oral Daily  . aspirin EC  81 mg Oral Daily  . donepezil  5 mg Oral QHS  . enoxaparin (LOVENOX) injection  40 mg Subcutaneous Daily  . gabapentin  900 mg Oral TID  .  haloperidol lactate  2 mg Intramuscular Once  . levothyroxine  100 mcg Oral QAC breakfast  . LORazepam  1 mg Oral TID  . melatonin  3 mg Oral QHS  . rosuvastatin  20 mg Oral Daily  . ticagrelor  90 mg Oral BID   Continuous Infusions: . ceFEPime (MAXIPIME) IV Stopped (12/29/20 0837)  . metronidazole 500 mg (12/29/20 1315)     LOS: 3 days    Time spent: 25 mins    Kemaria Dedic, MD [facility]   If 7PM-7AM, please contact night-coverage

## 2020-12-30 MED ORDER — AMLODIPINE BESYLATE 5 MG PO TABS: 5.0000 mg | ORAL_TABLET | Freq: Every day | ORAL | 0 refills | Status: DC

## 2020-12-30 MED ORDER — AMOXICILLIN-POT CLAVULANATE 875-125 MG PO TABS: 1.0000 | ORAL_TABLET | Freq: Two times a day (BID) | ORAL | 0 refills | Status: DC

## 2020-12-30 NOTE — Discharge Instructions (Signed)
Advised to follow up PCP in one week. Advised to follow up outpatient speech evaluation. Advised to take augmentin twice daily for 7 days to complete 10 days course for pneumonia.

## 2020-12-30 NOTE — Discharge Summary (Signed)
Physician Discharge Summary  George Bradley:[id number] DOB: 1941/05/18 DOA: 12/26/2020  PCP: [name], MD  Admit date: 12/26/2020   Discharge date: 12/30/2020  Admitted From: Home.  Disposition:  Home.  Recommendations for Outpatient Follow-up:  1. Follow up with PCP in 1-2 weeks. 2. Please obtain BMP/CBC in one week. 3. Advised to follow up outpatient speech evaluation. 4. Advised to take augmentin twice daily for 7 days to complete 10 days course for pneumonia.   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 He had not been complaining of anything earlier in the day. He has chronic dysphagia and often coughs and chokes while eating or drinking. EMS was called and found the patient to have oxygen saturation of 77% and heart rate 140. He was treated with 300 cc of saline and started 4 L/min supplemental oxygen prior to arrival in the ED. Upon arrival to the ED, patient is found to be febrile to 40.3C, saturating mid 90s on 4 L/min of supplemental oxygen, tachypneic, tachycardic, and with blood pressure 125/71.  Lactic acid was 2.0 and then 2.5. COVID-19 PCR is negative. CT chest, abdomen, and pelvis is concerning for multifocal pneumonia.    Hospital Course: Patient was admitted for severe sepsis and acute hypoxic respiratory failure secondary to pneumonia. Patient was requiring 4 L of supplemental oxygen.  He was started on IV antibiotics (vancomycin and cefepime).  MRSA nares negative, vancomycin discontinued.  Patient was continued on cefepime and Flagyl for aspiration pneumonia.  Blood cultures no growth so far,  procalcitonin levels are normal.  Repeat chest x-ray shows improving pneumonia.  Patient weaned down on room air,  sats 93%.  Patient has ambulated in the hallway without need for oxygen.  His O2 saturation remained above 93%.  Patient feels better and want to be discharged.  Patient is being discharged home on Augmentin 875 twice a day for 7 days to complete total 10-day treatment.  Patient will follow up outpatient speech and swallow evaluation.  He was managed for below problems.  Discharge Diagnoses:  Principal Problem:   Severe sepsis ([place]) Active Problems:   Hypothyroidism   Thrombocytopenia (H[facility])   Dysphagia   MGUS (monoclonal gammopathy of unknown significance)   CVA (cerebral vascular accident) ([place])   Acute respiratory failure with hypoxia (HCC)   Aspiration pneumonia (HCC)   Dementia ([place])   Acute encephalopathy  Severe sepsis secondary to pneumonia;  acute hypoxic respiratory failure: -Patient with dysphagia and frequent coughing while eating presents with acute-onset rigors, confusion, and lethargy and is found to have high fever, elevated lactate, elevated neutrophils, and multifocal pneumonia with new supplemental O2 requirementall consistent with severe sepsis. -Patient was started on empiric vancomycin and cefepime. -Blood cultures no growth so far, urine cultures insignificant growth. -MRSA negative,  discontinue vancomycin. Strep pneumonia negative, Legionella antigen negative. -Sputum culture, trend  procalcitonin and lactate, add Flagyl for suspected aspiration, and follow cultures and  clinical course. - Patient reports feeling better. Patient remains on 2 L/ m supplemental oxygen. -Sepsis physiology improving.  Patient weaned down to room air.  Acute encephalopathy; dementia> resolved -Patient with baseline dementia developed acute worsening in confusion , likely secondary to high fever/sepsis. -Continue to treat sepsis as above, institute delirium precautions  History of CVA -Admitted 12/19/20 with acute right MCA infarctionwithright ICA occlusion and underwent right ICA recannulization with thrombectomy and stenting  -Continue statin, ASA, and Brilinta.  Hypothyroidism -Continue Synthroid  Thrombocytopenia; MGUS -Platelets 130k on admission, similar to priors  Dysphagia -SLP consult recommended mechanical thick liquids. - Outpatient speech evaluation.  7. CKD II - Stable,will monitor    Discharge Instructions  Discharge Instructions    Ambulatory referral to Speech Therapy   Complete by: As directed    Call MD for:  persistant nausea and vomiting   Complete by: As directed    Diet - low sodium heart healthy   Complete by: As directed    Diet Carb Modified   Complete by: As directed    Discharge instructions   Complete by: As directed    Advised to follow up PCP in one week. Advised to follow up outpatient speech evaluation. Advised to take augmentin twice daily for 7 days to complete 10 days course for pneumonia.   Increase activity slowly   Complete by: As directed      Allergies as of 12/30/2020      Reactions   Zetia [ezetimibe] Other (See Comments)   "made me feel bad"      Medication List    STOP taking these medications   indomethacin 25 MG capsule Commonly known as: INDOCIN     TAKE these medications   amitriptyline 25 MG tablet Commonly known as: ELAVIL TAKE 1 TABLET(25 MG) BY MOUTH AT BEDTIME What changed: See the new instructions.   amLODipine 5 MG tablet Commonly known as: NORVASC Take 1  tablet (5 mg total) by mouth daily. Start taking on: December 31, 2020   amoxicillin-clavulanate 875-125 MG tablet Commonly known as: Augmentin Take 1 tablet by mouth 2 (two) times daily for 7 days.   aspirin 81 MG EC tablet Take 1 tablet (81 mg total) by mouth daily. Swallow whole.   busPIRone 5 MG tablet Commonly known as: BUSPAR Take 5 mg by mouth 2 (two) times daily as needed (anxiety).   donepezil 5 MG tablet Commonly known as: ARICEPT Take 5 mg by mouth at bedtime.   fludrocortisone 0.1 MG tablet Commonly known as: FLORINEF Take 0.1 mg by mouth daily.   gabapentin 300 MG capsule Commonly known as: NEURONTIN Take 3 capsules (900 mg total) by mouth 3 (three) times daily.   levothyroxine 100 MCG tablet Commonly known as: SYNTHROID TAKE ONE TABLET BY MOUTH ONCE DAILY BEFORE  BREAKFAST What changed: See the new instructions.   LORazepam 1 MG tablet Commonly known as: ATIVAN TAKE 1 TABLET BY MOUTH THREE TIMES DAILY What changed: when to take this   pilocarpine 7.5 MG tablet Commonly known as: SALAGEN Take 7.5 mg by mouth 3 (three) times daily.   Repatha SureClick 140 MG/ML Soaj Generic drug: Evolocumab Inject 140 mg into the skin every 14 (fourteen) days.   rosuvastatin 20 MG tablet Commonly known as: CRESTOR TAKE 1 TABLET BY MOUTH DAILY   ticagrelor 90 MG Tabs tablet Commonly known as: BRILINTA Take 1 tablet (90 mg total) by mouth  2 (two) times daily.   traMADol 50 MG tablet Commonly known as: ULTRAM Take 50 mg by mouth 2 (two) times daily as needed for moderate pain.       Follow-up Information    [facility].   Specialty: Rehabilitation Why: For Speech Therapy. You may call the office to schedule an appointment. A referral has been placed electronically.  Contact information: [address] [place] [id number] [place] [address] [phone]       [name], MD Follow up in 1  week(s).   Specialty: Internal Medicine Contact information: [place] [address] [phone]              Allergies  Allergen Reactions  . Zetia [Ezetimibe] Other (See Comments)    "made me feel bad"    Consultations:  None.   Procedures/Studies: CT Angio Head W or Wo Contrast  Result Date: 12/19/2020 CLINICAL DATA:  Worsening weakness and dizzy spells for 1 month. Small acute right MCA infarcts on MRI with evidence of right ICA occlusion. EXAM: CT ANGIOGRAPHY HEAD AND NECK TECHNIQUE: Multidetector CT imaging of the head and neck was performed using the standard protocol during bolus administration of intravenous contrast. Multiplanar CT image reconstructions and MIPs were obtained to evaluate the vascular anatomy. Carotid stenosis measurements (when applicable) are obtained utilizing NASCET criteria, using the distal internal carotid diameter as the denominator. CONTRAST:  148mL OMNIPAQUE IOHEXOL 350 MG/ML SOLN COMPARISON:  Head MRI 12/19/2020 FINDINGS: CTA NECK FINDINGS Aortic arch: Standard 3 vessel aortic arch with mild-to-moderate calcified plaque. No evidence of significant arch vessel origin stenosis. Right carotid system: The common carotid artery is occluded at its origin without common or internal carotid artery reconstitution in the neck. There is reconstitution of ECA branches. Left carotid system: Patent with mild calcified plaque at the carotid bifurcation. No evidence of significant stenosis or dissection. Vertebral arteries: Calcified plaque results in mild stenosis of the proximal right subclavian artery. The vertebral arteries are patent with the left being mildly dominant. There is mild stenosis of both vertebral artery origins due to calcified plaque. Skeleton: Moderate cervical disc and facet degeneration. Other neck: No evidence of cervical lymphadenopathy or mass. Upper chest: Scarring/potential post radiation fibrosis in the lung apices. Coronary  atherosclerosis. Review of the MIP images confirms the above findings CTA HEAD FINDINGS Anterior circulation: There is partial reconstitution of the petrous segment of the right ICA, however the cavernous segment appears largely occluded. There is more robust reconstitution of the right supraclinoid ICA. The intracranial left ICA is patent with mild calcified plaque not resulting in significant stenosis. ACAs and MCAs are patent without evidence of a proximal branch occlusion or significant proximal stenosis. There is slight diffuse asymmetric attenuation of the right MCA compared to the left. No aneurysm is identified. Posterior circulation: The intracranial vertebral arteries are patent with to the basilar at most mild atherosclerotic narrowing bilaterally. Patent PICA and SCA origins are identified bilaterally. The basilar artery is widely patent. There is a small right posterior communicating artery. Both PCAs are patent with mild atherosclerotic irregularity but no evidence of a significant proximal stenosis. No aneurysm is identified. Venous sinuses: Patent. Anatomic variants: None. Review of the MIP images confirms the above findings IMPRESSION: 1. 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 2. Mild bilateral vertebral artery origin stenoses. 3. Left-sided carotid atherosclerosis without significant stenosis. 4. Aortic Atherosclerosis (ICD10-I70.0). Electronically Signed  By: Logan Bores M.D.   On: 12/19/2020 16:31   CT HEAD WO CONTRAST  Result Date: 12/20/2020 CLINICAL DATA:  Right carotid and MCA thrombectomy. Right MCA territory CVA. EXAM: CT HEAD WITHOUT CONTRAST TECHNIQUE: Contiguous axial images were obtained from the base of the skull through the vertex without intravenous contrast. COMPARISON:  12/19/2020 FINDINGS: Brain: Normal anatomic configuration. Parenchymal volume loss is commensurate with the patient's age. Mild  periventricular white matter changes are present likely reflecting the sequela of small vessel ischemia. Remote left occipital cortical infarct is unchanged. No abnormal intra or extra-axial mass lesion or fluid collection. No abnormal mass effect or midline shift. No evidence of acute intracranial hemorrhage or infarct. Ventricular size is normal. Cerebellum unremarkable. Vascular: No asymmetric hyperdense vasculature at the skull base. Skull: Intact Sinuses/Orbits: Paranasal sinuses are clear. Orbits are unremarkable. Other: Mastoid air cells and middle ear cavities are clear. IMPRESSION: No evidence of acute intracranial hemorrhage or infarct. Known tiny punctate cortical infarcts within the right temporoparietal regions on a prior MRI examination are not well appreciated on this exam. Electronically Signed   By: Fidela Salisbury MD   On: 12/20/2020 01:50   CT Head Wo Contrast  Result Date: 12/19/2020 CLINICAL DATA:  Motor neuron disease. Additional history provided: Patient reports worsening weakness and dizzy spells for 1 month, symptoms worsening this week, fall 2 days ago. EXAM: CT HEAD WITHOUT CONTRAST TECHNIQUE: Contiguous axial images were obtained from the base of the skull through the vertex without intravenous contrast. COMPARISON:  Brain MRI 06/14/2017. FINDINGS: Brain: Mild cerebral and cerebellar atrophy. A small cortically based infarct within the left occipital lobe (PCA vascular territory) is new as compared to the brain MRI of 06/14/2017, but otherwise age-indeterminate. There is no acute intracranial hemorrhage. No extra-axial fluid collection. No evidence of intracranial mass. No midline shift. Vascular: No hyperdense vessel.  Atherosclerotic calcifications. Skull: Normal. Negative for fracture or focal lesion. Sinuses/Orbits: Visualized orbits show no acute finding. Small mucous retention cyst within the inferior right frontal sinus. IMPRESSION: A small cortically based infarct within the  left occipital lobe (PCA vascular territory) is new from the brain MRI of 06/14/2017, but otherwise age-indeterminate. Correlate with findings on the pending brain MRI. Stable mild generalized parenchymal atrophy. Small right frontal sinus mucous retention cyst. Electronically Signed   By: Kellie Simmering DO   On: 12/19/2020 14:18   CT Angio Neck W and/or Wo Contrast  Result Date: 12/19/2020 CLINICAL DATA:  Worsening weakness and dizzy spells for 1 month. Small acute right MCA infarcts on MRI with evidence of right ICA occlusion. EXAM: CT ANGIOGRAPHY HEAD AND NECK TECHNIQUE: Multidetector CT imaging of the head and neck was performed using the standard protocol during bolus administration of intravenous contrast. Multiplanar CT image reconstructions and MIPs were obtained to evaluate the vascular anatomy. Carotid stenosis measurements (when applicable) are obtained utilizing NASCET criteria, using the distal internal carotid diameter as the denominator. CONTRAST:  137mL OMNIPAQUE IOHEXOL 350 MG/ML SOLN COMPARISON:  Head MRI 12/19/2020 FINDINGS: CTA NECK FINDINGS Aortic arch: Standard 3 vessel aortic arch with mild-to-moderate calcified plaque. No evidence of significant arch vessel origin stenosis. Right carotid system: The common carotid artery is occluded at its origin without common or internal carotid artery reconstitution in the neck. There is reconstitution of ECA branches. Left carotid system: Patent with mild calcified plaque at the carotid bifurcation. No evidence of significant stenosis or dissection. Vertebral arteries: Calcified plaque results in mild stenosis of the proximal right subclavian  artery. The vertebral arteries are patent with the left being mildly dominant. There is mild stenosis of both vertebral artery origins due to calcified plaque. Skeleton: Moderate cervical disc and facet degeneration. Other neck: No evidence of cervical lymphadenopathy or mass. Upper chest: Scarring/potential post  radiation fibrosis in the lung apices. Coronary atherosclerosis. Review of the MIP images confirms the above findings CTA HEAD FINDINGS Anterior circulation: There is partial reconstitution of the petrous segment of the right ICA, however the cavernous segment appears largely occluded. There is more robust reconstitution of the right supraclinoid ICA. The intracranial left ICA is patent with mild calcified plaque not resulting in significant stenosis. ACAs and MCAs are patent without evidence of a proximal branch occlusion or significant proximal stenosis. There is slight diffuse asymmetric attenuation of the right MCA compared to the left. No aneurysm is identified. Posterior circulation: The intracranial vertebral arteries are patent with to the basilar at most mild atherosclerotic narrowing bilaterally. Patent PICA and SCA origins are identified bilaterally. The basilar artery is widely patent. There is a small right posterior communicating artery. Both PCAs are patent with mild atherosclerotic irregularity but no evidence of a significant proximal stenosis. No aneurysm is identified. Venous sinuses: Patent. Anatomic variants: None. Review of the MIP images confirms the above findings IMPRESSION: 1. 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 2. Mild bilateral vertebral artery origin stenoses. 3. Left-sided carotid atherosclerosis without significant stenosis. 4. Aortic Atherosclerosis (ICD10-I70.0). Electronically Signed   By: Logan Bores M.D.   On: 12/19/2020 16:31   MR BRAIN WO CONTRAST  Result Date: 12/20/2020 CLINICAL DATA:  80 year old male found to have right carotid occlusion, two small acute right MCA cortical infarcts on MRI yesterday. And repeat neck CTA yesterday demonstrating subsequent acute large vessel occlusion at the right MCA bifurcation. Then status post endovascular thrombectomy. EXAM: MRI HEAD WITHOUT CONTRAST TECHNIQUE:  Multiplanar, multiecho pulse sequences of the brain and surrounding structures were obtained without intravenous contrast. COMPARISON:  Brain MRI 1418 hours yesterday, along with CTA head and neck x2 yesterday. FINDINGS: Brain: There remain only a few small cortical and subcortical white matter infarcts scattered in the right MCA territory, such as on series 2, image 40. About six such areas are present now, versus the two seen yesterday. No contralateral left hemisphere or posterior fossa restricted diffusion. Minor cytotoxic edema in the affected areas with no acute hemorrhage. No mass effect. A few scattered chronic microhemorrhages in the brain, mostly the left hemisphere, are stable since yesterday. Stable pre-existing scattered white matter T2 and FLAIR hyperintensity, and a small area of chronic encephalomalacia in the left occipital pole. Stable small chronic left cerebellar infarct near midline. No midline shift, mass effect, evidence of mass lesion, ventriculomegaly, extra-axial collection or acute intracranial hemorrhage. Cervicomedullary junction and pituitary are within normal limits. Vascular: Major intracranial vascular flow voids are now normalized, including the right ICA siphon. Skull and upper cervical spine: Stable and negative for age. Sinuses/Orbits: Stable and negative. Other: Trace mastoid fluid is stable. Negative visible scalp and face. IMPRESSION: 1. Following right carotid and MCA revascularization there are only a few scattered small cortical and subcortical white matter infarcts in the right MCA territory, in addition to the two seen by MRI yesterday. No hemorrhage or mass effect. 2. Small chronic left PCA territory infarct, small left cerebellar infarct. Mild for age white matter signal changes. Electronically Signed   By: Genevie Ann M.D.   On: 12/20/2020  06:59   MR BRAIN WO CONTRAST  Result Date: 12/19/2020 CLINICAL DATA:  Worsening ataxia over the past few weeks. EXAM: MRI HEAD  WITHOUT CONTRAST TECHNIQUE: Multiplanar, multiecho pulse sequences of the brain and surrounding structures were obtained without intravenous contrast. COMPARISON:  Head CT 12/19/2020 and MRI 06/14/2017 FINDINGS: Brain: There are subcentimeter acute cortical infarcts at the right temporoparietal junction as well as more superiorly in the right parietal lobe. The small left occipital infarct seen on CT is chronic. There is also a small chronic cortical infarct anterolaterally in the right frontal lobe. There is a small chronic infarct medially in the left cerebellar hemisphere. Scattered small foci of T2 hyperintensity in the cerebral white matter and pons have slightly progressed from the prior MRI and are nonspecific but compatible with mild chronic small vessel ischemic disease. There is mild generalized cerebral atrophy. Scattered chronic microhemorrhages in both cerebral hemispheres have slightly increased in number from the prior MRI. No mass, midline shift, or extra-axial fluid collection is identified. Vascular: Loss of the normal flow void of the distal cervical and proximal intracranial portions of the right internal carotid artery with reconstitution of the supraclinoid ICA flow void. Skull and upper cervical spine: Unremarkable bone marrow signal. Sinuses/Orbits: Bilateral cataract extraction. Minimal mucosal thickening in the paranasal sinuses. Small right mastoid effusion. Other: None. IMPRESSION: 1. Two small acute cortical infarcts in the right MCA territory. 2. Multiple small chronic cerebral and cerebellar infarcts as above. 3. Absent flow void in the distal cervical and proximal intracranial right internal carotid consistent with occlusion, new from 2018. Electronically Signed   By: Logan Bores M.D.   On: 12/19/2020 15:06   CT CHEST ABDOMEN PELVIS W CONTRAST  Result Date: 12/26/2020 CLINICAL DATA:  Abdominal pain, fever EXAM: CT CHEST, ABDOMEN, AND PELVIS WITH CONTRAST TECHNIQUE: Multidetector  CT imaging of the chest, abdomen and pelvis was performed following the standard protocol during bolus administration of intravenous contrast. CONTRAST:  100 mL Omnipaque 300 IV COMPARISON:  CT abdomen 08/17/2020. FINDINGS: CT CHEST FINDINGS Cardiovascular: Cardiomegaly. Prior CABG. Diffuse aortic atherosclerosis. No aneurysm. Mediastinum/Nodes: No mediastinal, hilar, or axillary adenopathy. Trachea and esophagus are unremarkable. Thyroid unremarkable. Lungs/Pleura: Airspace disease noted throughout the right middle lobe and both lower lobes most compatible with pneumonia. No effusions. Musculoskeletal: Chest wall soft tissues are unremarkable. No acute bony abnormality. CT ABDOMEN PELVIS FINDINGS Hepatobiliary: No focal hepatic abnormality. Gallbladder unremarkable. Pancreas: No focal abnormality or ductal dilatation. Spleen: No focal abnormality.  Normal size. Adrenals/Urinary Tract: No hydronephrosis or ureteral stones. Small cyst in the midpole of the right kidney. Adrenal glands and urinary bladder unremarkable. Stomach/Bowel: Large stool burden throughout the colon. No evidence of bowel obstruction. Appendix is normal. Vascular/Lymphatic: Aortoiliac atherosclerosis. No evidence of aneurysm or adenopathy. Reproductive: No visible focal abnormality. Other: No free fluid or free air. Musculoskeletal: No acute bony abnormality. Degenerative changes in the lumbar spine. IMPRESSION: Bilateral airspace disease throughout the right middle lobe and both lower lobes compatible with pneumonia. Large stool burden in the colon. Prior CABG.  Diffuse aortic atherosclerosis. Electronically Signed   By: Rolm Baptise M.D.   On: 12/26/2020 22:56   IR CT Head Ltd  Result Date: 12/21/2020 INDICATION: 80 year old male with past medical history significant for aortic atherosclerosis, bilateral renal artery stenosis, chronic kidney disease, coronary artery, gastroesophageal reflux disease, hyperlipidemia, chronic orthostatic  hypotension, and BPH. He initially presented to [facility] complaining of generalized weakness, tremors and vision changes. CT angiogram of the head and neck showed  a right common carotid artery and cervical right ICA occlusion with intracranial reconstitution and no intracranial occlusion. During transferred to [facility] he was noted to have sudden change in his neurological exam with sudden onset of left-sided weakness. Repeat CT angiogram showed a new right MCA/M1 occlusion in addition to his cervical occlusion. Acute neuro changes noted at 17:40 on 12/19/2020, NIHSS 19 at presentation to [facility], modified Rankin scale 0. He was then transferred to our service for emergency cerebral angiogram and thrombectomy. EXAM: ULTRASOUND-GUIDED VASCULAR ACCESS DIAGNOSTIC CEREBRAL ANGIOGRAM MECHANICAL THROMBECTOMY FLAT PANEL HEAD CT COMPARISON:  CT/CT angiogram of the head and neck December 19, 2020. MEDICATIONS: Cangrelor bolus and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 Considering the fact that laryngeal invasion was not eliminated with use of thickened liquids, and that evidence shows that his risk of aspiration-related complications would be higher with aspiration of  thickened liquids, it is recommended that his current diet of dysphagia 3 solids and nectar thick liquids be continued at this time with observance of swallowing precautions. SLP will follow for dysphagia treatment. SLP Visit Diagnosis Dysphagia, pharyngeal phase (R13.13) Attention and concentration deficit following -- Frontal lobe and executive function deficit following -- Impact on safety and function Moderate aspiration risk   CHL IP TREATMENT RECOMMENDATION 12/28/2020 Treatment Recommendations Therapy as outlined in treatment plan below   Prognosis 12/28/2020 Prognosis for Safe Diet Advancement Fair Barriers to Reach Goals Time post onset;Severity of deficits Barriers/Prognosis Comment -- CHL IP DIET RECOMMENDATION 12/28/2020 SLP Diet Recommendations Dysphagia 3 (Mech soft) solids;Thin liquid Liquid Administration via Cup;No straw Medication Administration Whole meds with puree Compensations Slow rate;Small sips/bites;Follow solids with liquid Postural Changes Seated upright at 90 degrees   CHL IP OTHER RECOMMENDATIONS 12/28/2020 Recommended Consults -- Oral Care Recommendations Patient independent with oral care;Oral care QID Other Recommendations --   CHL IP FOLLOW UP RECOMMENDATIONS 12/28/2020 Follow up Recommendations Outpatient SLP   CHL IP FREQUENCY AND DURATION 12/28/2020 Speech Therapy Frequency (ACUTE ONLY) min 2x/week Treatment Duration 2 weeks      CHL IP ORAL PHASE 12/28/2020 Oral Phase WFL Oral - Pudding Teaspoon -- Oral - Pudding Cup -- Oral - Honey Teaspoon -- Oral - Honey Cup -- Oral - Nectar Teaspoon -- Oral - Nectar Cup -- Oral - Nectar Straw -- Oral - Thin Teaspoon -- Oral - Thin Cup -- Oral - Thin Straw -- Oral - Puree -- Oral - Mech Soft -- Oral - Regular -- Oral - Multi-Consistency -- Oral - Pill -- Oral Phase - Comment --  CHL IP PHARYNGEAL PHASE 12/28/2020 Pharyngeal Phase Impaired Pharyngeal- Pudding Teaspoon -- Pharyngeal -- Pharyngeal- Pudding Cup -- Pharyngeal -- Pharyngeal- Honey Teaspoon --  Pharyngeal -- Pharyngeal- Honey Cup -- Pharyngeal -- Pharyngeal- Nectar Teaspoon -- Pharyngeal --  Pharyngeal- Nectar Cup Penetration/Apiration after swallow;Penetration/Aspiration during swallow;Trace aspiration;Reduced epiglottic inversion;Reduced anterior laryngeal mobility;Reduced airway/laryngeal closure;Delayed swallow initiation-vallecula;Pharyngeal residue - valleculae;Pharyngeal residue - pyriform Pharyngeal Material enters airway, CONTACTS cords and not ejected out;Material enters airway, remains ABOVE vocal cords and not ejected out;Material enters airway, passes BELOW cords without attempt by patient to eject out (silent aspiration) Pharyngeal- Nectar Straw Penetration/Apiration after swallow;Penetration/Aspiration during swallow;Trace aspiration;Reduced epiglottic inversion;Reduced anterior laryngeal mobility;Reduced airway/laryngeal closure;Delayed swallow initiation-vallecula;Pharyngeal residue - valleculae;Pharyngeal residue - pyriform Pharyngeal Material enters airway, remains ABOVE vocal cords and not ejected out;Material enters airway, CONTACTS cords and not ejected out;Material enters airway, passes BELOW cords without attempt by patient to eject out (silent aspiration) Pharyngeal- Thin Teaspoon -- Pharyngeal -- Pharyngeal- Thin Cup Penetration/Apiration after swallow;Penetration/Aspiration during swallow;Trace aspiration;Reduced epiglottic inversion;Reduced anterior laryngeal mobility;Reduced airway/laryngeal closure;Delayed swallow initiation-vallecula;Pharyngeal residue - valleculae;Pharyngeal residue - pyriform Pharyngeal Material enters airway, remains ABOVE vocal cords and not ejected out;Material enters airway, CONTACTS cords and not ejected out;Material enters airway, passes BELOW cords without attempt by patient to eject out (silent aspiration) Pharyngeal- Thin Straw -- Pharyngeal -- Pharyngeal- Puree -- Pharyngeal -- Pharyngeal- Mechanical Soft -- Pharyngeal -- Pharyngeal- Regular  Reduced epiglottic inversion;Reduced anterior laryngeal mobility;Reduced airway/laryngeal closure;Delayed swallow initiation-vallecula;Pharyngeal residue - valleculae;Pharyngeal residue - pyriform Pharyngeal -- Pharyngeal- Multi-consistency -- Pharyngeal -- Pharyngeal- Pill Reduced epiglottic inversion;Reduced anterior laryngeal mobility;Reduced airway/laryngeal closure;Delayed swallow initiation-vallecula;Pharyngeal residue - valleculae;Pharyngeal residue - pyriform;Pharyngeal residue - cp segment Pharyngeal -- Pharyngeal Comment --  CHL IP CERVICAL ESOPHAGEAL PHASE 12/28/2020 Cervical Esophageal Phase Impaired Pudding Teaspoon -- Pudding Cup -- Honey Teaspoon -- Honey Cup -- Nectar Teaspoon -- Nectar Cup -- Nectar Straw -- Thin Teaspoon -- Thin Cup -- Thin Straw -- Puree -- Mechanical Soft -- Regular -- Multi-consistency -- Pill Reduced cricopharyngeal relaxation Cervical Esophageal Comment -- Shanika I. Hardin Negus, [place], [place] Office number [phone] Pager [phone] Horton Marshall 12/28/2020, 2:21 PM              ECHOCARDIOGRAM COMPLETE  Result Date: 12/20/2020    ECHOCARDIOGRAM REPORT   Patient Name:   KORREY SCHLEICHER Date of Exam: 12/20/2020 Medical Rec #:  [id number]     Height:       73.0 in Accession #:    [id number]    Weight:       177.0 lb Date of Birth:  [birth year]     BSA:          2.043 m Patient Age:    71 years      BP:           133/75 mmHg Patient Gender: M             HR:           76 bpm. Exam Location:  Inpatient Procedure: 2D Echo Indications:    Stroke I63.9  History:        Patient has prior history of Echocardiogram examinations, most                 recent 07/13/2020. CAD, Aortic Valve Disease; Risk                 Factors:Dyslipidemia.  Sonographer:    Mikki Santee RDCS (AE) Referring Phys: [id number] [place]  1. Left ventricular ejection fraction, by estimation, is 50 to 55%. The left ventricle has low normal function. The left  ventricle demonstrates regional wall motion abnormalities (see scoring diagram/findings for description). Left ventricular diastolic  parameters are indeterminate. There  is mild hypokinesis of the left ventricular, apical inferior wall.  2. Right ventricular systolic function is moderately reduced. The right ventricular size is normal.  3. Left atrial size was mild to moderately dilated.  4. Right atrial size was mildly dilated.  5. The mitral valve is grossly normal. Mild mitral valve regurgitation.  6. Small Lambl's excrescence vs calcification seen on ventricular aspect of aortic valve. The aortic valve is calcified. There is severe calcifcation of the aortic valve. There is severe thickening of the aortic valve. Aortic valve regurgitation is mild. Moderate aortic valve stenosis. Aortic valve mean gradient measures 21.0 mmHg. Comparison(s): No significant change from prior study. Conclusion(s)/Recommendation(s): No intracardiac source of embolism detected on this transthoracic study. A transesophageal echocardiogram is recommended to exclude cardiac source of embolism if clinically indicated. FINDINGS  Left Ventricle: Left ventricular ejection fraction, by estimation, is 50 to 55%. The left ventricle has low normal function. The left ventricle demonstrates regional wall motion abnormalities. Mild hypokinesis of the left ventricular, apical inferior wall. The left ventricular internal cavity size was normal in size. There is no left ventricular hypertrophy. Left ventricular diastolic parameters are indeterminate. Right Ventricle: The right ventricular size is normal. No increase in right ventricular wall thickness. Right ventricular systolic function is moderately reduced. Left Atrium: Left atrial size was mild to moderately dilated. Right Atrium: Right atrial size was mildly dilated. Pericardium: There is no evidence of pericardial effusion. Mitral Valve: The mitral valve is grossly normal. There is mild  thickening of the mitral valve leaflet(s). There is mild calcification of the mitral valve leaflet(s). Mild to moderate mitral annular calcification. Mild mitral valve regurgitation. Tricuspid Valve: The tricuspid valve is normal in structure. Tricuspid valve regurgitation is trivial. Aortic Valve: Small Lambl's excrescence vs calcification seen on ventricular aspect of aortic valve. The aortic valve is calcified. There is severe calcifcation of the aortic valve. There is severe thickening of the aortic valve. Aortic valve regurgitation is mild. Moderate aortic stenosis is present. Aortic valve mean gradient measures 21.0 mmHg. Aortic valve peak gradient measures 31.8 mmHg. Aortic valve area, by VTI measures 1.42 cm. Pulmonic Valve: The pulmonic valve was grossly normal. Pulmonic valve regurgitation is mild. Aorta: The aortic root is normal in size and structure. Venous: The inferior vena cava was not well visualized. IAS/Shunts: The atrial septum is grossly normal.  LEFT VENTRICLE PLAX 2D LVIDd:         5.00 cm  Diastology LVIDs:         3.60 cm  LV e' medial:    5.00 cm/s LV PW:         1.10 cm  LV E/e' medial:  13.7 LV IVS:        1.10 cm  LV e' lateral:   9.57 cm/s LVOT diam:     2.20 cm  LV E/e' lateral: 7.1 LV SV:         79 LV SV Index:   39 LVOT Area:     3.80 cm  RIGHT VENTRICLE RV S prime:     7.72 cm/s TAPSE (M-mode): 1.3 cm LEFT ATRIUM             Index       RIGHT ATRIUM           Index LA diam:        3.90 cm 1.91 cm/m  RA Area:     13.50 cm LA Vol (A2C):   68.4 ml 33.48 ml/m RA Volume:  26.90 ml  13.17 ml/m LA Vol (A4C):   49.4 ml 24.18 ml/m LA Biplane Vol: 61.6 ml 30.16 ml/m  AORTIC VALVE AV Area (Vmax):    1.37 cm AV Area (Vmean):   1.27 cm AV Area (VTI):     1.42 cm AV Vmax:           282.00 cm/s AV Vmean:          220.000 cm/s AV VTI:            0.554 m AV Peak Grad:      31.8 mmHg AV Mean Grad:      21.0 mmHg LVOT Vmax:         102.00 cm/s LVOT Vmean:        73.700 cm/s LVOT VTI:           0.207 m LVOT/AV VTI ratio: 0.37  AORTA Ao Root diam: 3.30 cm MITRAL VALVE MV Area (PHT): 2.69 cm     SHUNTS MV Decel Time: 282 msec     Systemic VTI:  0.21 m MV E velocity: 68.30 cm/s   Systemic Diam: 2.20 cm MV A velocity: 103.00 cm/s MV E/A ratio:  0.66 Buford Dresser MD Electronically signed by Buford Dresser MD Signature Date/Time: 12/20/2020/7:49:25 PM    Final    IR PERCUTANEOUS ART THROMBECTOMY/INFUSION INTRACRANIAL INC DIAG ANGIO  Result Date: 12/21/2020 INDICATION: 80 year old male with past medical history significant for aortic atherosclerosis, bilateral renal artery stenosis, chronic kidney disease, coronary artery, gastroesophageal reflux disease, hyperlipidemia, chronic orthostatic hypotension, and BPH. He initially presented to [facility] complaining of generalized weakness, tremors and vision changes. 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 During transferred to [facility] he was noted to have sudden change in his neurological exam with sudden onset of left-sided weakness. Repeat CT angiogram showed a new right MCA/M1 occlusion in addition to his cervical occlusion. Acute neuro changes noted at 17:40 on 12/19/2020, NIHSS 19 at presentation to [facility], modified Rankin scale 0. He was then transferred to our service for emergency cerebral angiogram and thrombectomy. EXAM: ULTRASOUND-GUIDED VASCULAR ACCESS DIAGNOSTIC CEREBRAL ANGIOGRAM MECHANICAL THROMBECTOMY FLAT PANEL HEAD CT COMPARISON:  CT/CT angiogram of the head and neck December 19, 2020. MEDICATIONS: Cangrelor bolus and drip, IV. ANESTHESIA/SEDATION: The procedure was performed under general anesthesia. CONTRAST:  75 mL of Omnipaque 240 milligrams/mL. FLUOROSCOPY TIME:  Fluoroscopy Time: 30 minutes 24 seconds (941.1 mGy). COMPLICATIONS: None immediate. TECHNIQUE: Informed written consent was obtained from the patient's  spouse after a thorough discussion of the procedural risks, benefits and alternatives. All questions were addressed. Maximal Sterile Barrier Technique was utilized including caps, mask, sterile gowns, sterile gloves, sterile drape, hand hygiene and skin antiseptic. A timeout was performed prior to the initiation of the procedure. The right groin was prepped and draped in the usual sterile fashion. Using a micropuncture kit and the modified Seldinger technique, access was gained to the right common femoral artery and an 8 French sheath was placed. Real-time ultrasound guidance was utilized for vascular access including the acquisition of a permanent ultrasound image documenting patency of the accessed vessel. Under fluoroscopy, an 8 [place] Walrus balloon guide catheter was navigated over a 6 [place] Berenstein 2 catheter and a 0.035" Terumo Glidewire into the aortic arch. The catheter was placed into the right common carotid artery frontal and lateral angiograms of the neck were obtained. FINDINGS: Filling defect consistent with clot  within the right common carotid artery, along the carotid bifurcation with contrast penetration into the cervical right ICA. Large filling defect within the bulb and proximal cervical right ICA and occlusive clot within the petrous segment of the right ICA. PROCEDURE: The Berenstein 2 catheter was then advanced over the wire into the right internal carotid artery. The walrus guide catheter was then advanced over the [facility] 2 catheter into the cervical right internal carotid artery. The Berenstein 2 and wire were removed. Manual aspiration performed until clear blood was seen. Large amount of clot retrieved. Right ICA angiograms were performed with frontal and lateral views of the upper neck and head. Recanalization of the cervical and intracranial right ICA noted as well as recanalization of the M1 segment. An occlusive clot within the distal right M2/MCA posterior division branch  noted. Under biplane roadmap, a zoom 55 aspiration catheter was navigated over an Aristotle 14 microguidewire into the right M1/MCA. The aspiration catheter was then advanced to the level of occlusion and connected to a penumbra aspiration pump. The guiding catheter balloon was inflated. The aspiration catheter was removed under constant aspiration. Follow-up right ICA angiograms with frontal and lateral views of the head showed complete recanalization of the right MCA territory. The guiding catheter was retracted into the right common carotid artery. Angioplasty at the right carotid bifurcation was performed with a 3 x 12 mm trek balloon. Right common carotid artery angiograms with frontal and lateral views of the neck showed improved caliber at the right carotid bifurcation, occlusion of the ECA and nonocclusive filling defect concerning for clot. Aspiration through the guiding catheter was performed. However, follow-up right common carotid artery angiogram showed progression of clot formation and near occlusion of the proximal right ICA. Flat panel CT of the head was obtained and post processed in a separate workstation with concurrent attending physician supervision. Selected images were sent to PACS. No evidence of hemorrhagic complication seen. Patient was loaded on cangrelor. Subsequently, a 6-8 x 40 mm XACT carotid stent was deployed across the area of stenosis. Follow-up angiogram showed resolution of the stenosis with prompt fall or flow with no evidence of residual filling defect. Right internal carotid artery angiogram with frontal and lateral views of the head showed reocclusion of the right MCA at the proximal M1 segment. Under biplane roadmap, a zoom 71 aspiration catheter was navigated over an Aristotle 14 microguidewire into the right M1/MCA. The aspiration catheter was connected to a penumbra aspiration pump. The guiding catheter balloon was inflated. The aspiration catheter was removed under  constant aspiration. Follow-up right ICA angiograms with frontal and lateral views of the head showed complete recanalization of the right MCA territory. Delay angiograms of the right common carotid artery showed patent recently deployed stent with no evidence of thromboembolic complication. Delayed angiograms of the head with frontal and lateral views showed no thromboembolic complication. Right common femoral artery angiograms were obtained with frontal and lateral views. The right common femoral artery has normal caliber. The femoral sheath was exchanged for an 8 [place] Angio-Seal which was utilized for access closure. Immediate hemostasis was achieved. 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 PLAN: 1. Patient will be started on dual anti-platelet therapy with Brilinta and aspirin after follow-up CT confirms no evidence of hemorrhagic complication. 2. Carotid duplex within 3 months to evaluate for stent patency. Electronically Signed   By:  Pedro Earls M.D.   On: 12/21/2020 16:56   CT HEAD CODE STROKE WO CONTRAST  Result Date: 12/19/2020 CLINICAL DATA:  Code stroke.  Left-sided facial droop. EXAM: CT HEAD WITHOUT CONTRAST TECHNIQUE: Contiguous axial images were obtained from the base of the skull through the vertex without intravenous contrast. COMPARISON:  Head CT, CTA, and MRI 12/19/2020 FINDINGS: Brain: No acute large territory infarct, intracranial hemorrhage, mass, midline shift, or extra-axial fluid collection is identified. The 2 subcentimeter acute right MCA cortical infarcts on MRI are not well shown by CT. There is mild cerebral atrophy. Hypodensities in the cerebral white matter bilaterally are unchanged and nonspecific but compatible with mild chronic small vessel ischemic disease. Small  chronic infarcts are again noted in the right frontal lobe and left occipital lobe. Vascular: Limited assessment due to residual intravascular contrast from today's earlier CTA. Skull: No fracture or suspicious osseous lesion. Sinuses/Orbits: Small mucous retention cyst in the right frontal sinus. Small right mastoid effusion. Bilateral cataract extraction. Other: None. ASPECTS [facility] Stroke Program Early CT Score) - Ganglionic level infarction (caudate, lentiform nuclei, internal capsule, insula, M1-M3 cortex): 7 - Supraganglionic infarction (M4-M6 cortex): 3 Total score (0-10 with 10 being normal): 10 IMPRESSION: 1. No evidence of acute large territory infarct or intracranial hemorrhage. Subcentimeter acute right MCA infarcts on MRI are occult by CT. 2. ASPECTS is 10. These results were communicated to Dr. Lorrin Goodell at 6:24 pm on 12/19/2020 by text page via the [facility] messaging system. Electronically Signed   By: Logan Bores M.D.   On: 12/19/2020 18:24   CT ANGIO HEAD CODE STROKE  Result Date: 12/19/2020 CLINICAL DATA:  Left facial droop. EXAM: CT ANGIOGRAPHY HEAD AND NECK TECHNIQUE: Multidetector CT imaging of the head and neck was performed using the standard protocol during bolus administration of intravenous contrast. Multiplanar CT image reconstructions and MIPs were obtained to evaluate the vascular anatomy. Carotid stenosis measurements (when applicable) are obtained utilizing NASCET criteria, using the distal internal carotid diameter as the denominator. CONTRAST:  66mL OMNIPAQUE IOHEXOL 350 MG/ML SOLN COMPARISON:  CTA head and neck earlier today FINDINGS: CTA NECK FINDINGS Aortic arch: Standard 3 vessel aortic arch with mild-to-moderate calcified plaque. No evidence of significant arch vessel origin stenosis. 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 Left carotid system: Patent  with mild calcified plaque in the mid common carotid artery and at the carotid bifurcation. No evidence of significant stenosis or dissection. Vertebral arteries: Calcified plaque results in unchanged mild stenosis of the proximal right subclavian artery. The vertebral arteries remain patent with unchanged mild stenosis of both origins due to calcified plaque. The left vertebral artery is mildly dominant. Skeleton: Moderate cervical disc and facet degeneration. Other neck: No evidence of cervical lymphadenopathy or mass. Post radiation changes in the neck. Upper chest: Scarring or post radiation fibrosis in the lung apices. 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 There is an early bifurcation of the right MCA with a new filling defect at the bifurcation consistent with an embolus. The origin of the right M2 superior division is narrowed but remains patent. The right M2 inferior division is occluded over approximately 6 mm long segment beginning at the MCA bifurcation with subsequent reconstitution. There is also occlusion of some  small right MCA branches more distally compared to today's earlier CTA. The left MCA and both ACAs remain patent with branch vessel irregularity but no evidence of a significant proximal stenosis. No aneurysm is identified. Posterior circulation: The intracranial vertebral arteries are patent to the basilar with atherosclerosis resulting in mild luminal irregularity and at most mild narrowing bilaterally. Patent PICA and SCA origins are identified bilaterally. The basilar artery is widely patent. There is a small right posterior communicating artery. Both PCAs are patent with mild atherosclerotic irregularity but no evidence of a significant proximal stenosis. No aneurysm is identified. Venous sinuses: Patent. Anatomic  variants: None. Review of the MIP images confirms the above findings IMPRESSION: 1. New embolus at the right MCA bifurcation with short segment occlusion of the M2 inferior division, narrowing of the origin of the M2 superior division, and occlusion of some more distal MCA branch vessels. 2. 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 3. Mild bilateral vertebral artery origin stenoses. 4. Left-sided carotid atherosclerosis without significant stenosis. 5.  Aortic Atherosclerosis (ICD10-I70.0). These results were communicated to Dr. Lorrin Goodell at 6:24 pm on 12/19/2020 by text page via the [facility] messaging system. Electronically Signed   By: Logan Bores M.D.   On: 12/19/2020 18:38   CT ANGIO NECK CODE STROKE  Result Date: 12/19/2020 CLINICAL DATA:  Left facial droop. EXAM: CT ANGIOGRAPHY HEAD AND NECK TECHNIQUE: Multidetector CT imaging of the head and neck was performed using the standard protocol during bolus administration of intravenous contrast. Multiplanar CT image reconstructions and MIPs were obtained to evaluate the vascular anatomy. Carotid stenosis measurements (when applicable) are obtained utilizing NASCET criteria, using the distal internal carotid diameter as the denominator. CONTRAST:  60mL OMNIPAQUE IOHEXOL 350 MG/ML SOLN COMPARISON:  CTA head and neck earlier today FINDINGS: CTA NECK FINDINGS Aortic arch: Standard 3 vessel aortic arch with mild-to-moderate calcified plaque. No evidence of significant arch vessel origin stenosis. 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 Left carotid system: Patent with mild calcified plaque in the mid common carotid artery and at the carotid bifurcation. No evidence of significant stenosis or dissection. Vertebral arteries: Calcified plaque results  in unchanged mild stenosis of the proximal right subclavian artery. The vertebral arteries remain patent with unchanged mild stenosis of both origins due to calcified plaque. The left vertebral artery is mildly dominant. Skeleton: Moderate cervical disc and facet degeneration. Other neck: No evidence of cervical lymphadenopathy or mass. Post radiation changes in the neck. Upper chest: Scarring or post radiation fibrosis in the lung apices. 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 There is an early bifurcation of the right MCA with a new filling defect at the bifurcation consistent with an embolus. The origin of the right M2 superior division is narrowed but remains patent. The right M2 inferior division is occluded over approximately 6 mm long segment beginning at the MCA bifurcation with subsequent reconstitution. There is also occlusion of some small right MCA branches more distally compared to today's earlier CTA. The left MCA and both ACAs remain patent with branch vessel irregularity but no evidence of a significant proximal stenosis. No aneurysm is identified. Posterior circulation: The intracranial vertebral arteries are patent  to the basilar with atherosclerosis resulting in mild luminal irregularity and at most mild narrowing bilaterally. Patent PICA and SCA origins are identified bilaterally. The basilar artery is widely patent. There is a small right posterior communicating artery. Both PCAs are patent with mild atherosclerotic irregularity but no evidence of a significant proximal stenosis. No aneurysm is identified. Venous sinuses: Patent. Anatomic variants: None. Review of the MIP images confirms the above findings IMPRESSION: 1. 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 2. 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 3. Mild bilateral vertebral artery origin stenoses. 4. Left-sided carotid atherosclerosis without significant stenosis. 5.  Aortic Atherosclerosis (ICD10-I70.0). These results were communicated to Dr. Lorrin Goodell at 6:24 pm on 12/19/2020 by text page via the [facility] messaging system. Electronically Signed   By: Logan Bores M.D.   On: 12/19/2020 18:38      Subjective: Patient was seen and examined at bedside, denies any symptoms. Patient has ambulated without oxygen requirement., wants to be discharged.,  Discharge Exam: Vitals:   12/30/20 0312 12/30/20 0730  BP: (!) 143/92 (!) 154/88  Pulse: 96 86  Resp: 18 16  Temp: 98.5 F (36.9 C) 98 F (36.7 C)  SpO2: 91% 92%   Vitals:   12/29/20 2027 12/29/20 2332 12/30/20 0312 12/30/20 0730  BP: (!) 159/86 (!) 160/90 (!) 143/92 (!) 154/88  Pulse: 77 83 96 86  Resp: (!) $RemoveB'21 17 18 16  'jdnETFmH$ Temp: 97.7 F (36.5 C) 98.5 F (36.9 C) 98.5 F (36.9 C) 98 F (36.7 C)  TempSrc: Oral Oral Oral Oral  SpO2: 90% 93% 91% 92%  Weight:   78 kg   Height:        General: Pt is alert, awake, not in acute distress Cardiovascular: RRR, S1/S2 +, no rubs, no gallops Respiratory: CTA bilaterally, no wheezing, no rhonchi Abdominal: Soft, NT, ND, bowel sounds + Extremities: no edema, no cyanosis    The results of significant diagnostics from this hospitalization (including imaging, microbiology, ancillary and laboratory) are listed below for reference.     Microbiology: Recent Results (from the past 240 hour(s))  Culture, blood (Routine x 2)     Status: None (Preliminary result)   Collection Time: 12/26/20  6:28 PM   Specimen: BLOOD  Result Value Ref Range Status   Specimen Description BLOOD LEFT ANTECUBITAL   Final   Special Requests   Final    BOTTLES DRAWN AEROBIC AND ANAEROBIC Blood Culture adequate volume   Culture   Final    NO GROWTH 4 DAYS Performed at [facility], [address], [place], [place] [address]    Report Status PENDING  Incomplete  Urine culture     Status: Abnormal   Collection Time: 12/26/20  6:29 PM   Specimen: In/Out Cath Urine  Result Value Ref Range Status   Specimen Description IN/OUT CATH URINE  Final   Special Requests NONE  Final   Culture (A)  Final    <10,000 COLONIES/mL INSIGNIFICANT GROWTH Performed at [facility], [place] [address] [place], [place] [address]    Report Status 12/28/2020 FINAL  Final  Culture, blood (Routine x 2)     Status: None (Preliminary result)   Collection Time: 12/26/20  6:38 PM  Specimen: BLOOD  Result Value Ref Range Status   Specimen Description BLOOD BLOOD RIGHT FOREARM  Final   Special Requests   Final    BOTTLES DRAWN AEROBIC AND ANAEROBIC Blood Culture results may not be optimal due to an inadequate volume of blood received in culture bottles   Culture   Final    NO GROWTH 4 DAYS Performed at [facility], [place] [address] [place], [place] [address]    Report Status PENDING  Incomplete  Resp Panel by RT-PCR (Flu A&B, Covid) Urine, Clean Catch     Status: None   Collection Time: 12/26/20  8:27 PM   Specimen: Urine, Clean Catch; Nasopharyngeal(NP) swabs in vial transport medium  Result Value Ref Range Status   SARS Coronavirus 2 by RT PCR NEGATIVE NEGATIVE Final    Comment: (NOTE) SARS-CoV-2 target nucleic acids are NOT DETECTED.  The SARS-CoV-2 RNA is generally detectable in upper respiratory specimens during the acute phase of infection. The lowest concentration of SARS-CoV-2 viral copies this assay can detect is 138 copies/mL. A negative result does not preclude SARS-Cov-2 infection and should not be used as the sole basis for treatment or other patient management decisions. A negative result  may occur with  improper specimen collection/handling, submission of specimen other than nasopharyngeal swab, presence of viral mutation(s) within the areas targeted by this assay, and inadequate number of viral copies(<138 copies/mL). A negative result must be combined with clinical observations, patient history, and epidemiological information. The expected result is Negative.  Fact Sheet for Patients:  EntrepreneurPulse.com.au  Fact Sheet for Healthcare Providers:  IncredibleEmployment.be  This test is no t yet approved or cleared by the [place] FDA and  has been authorized for detection and/or diagnosis of SARS-CoV-2 by FDA under an Emergency Use Authorization (EUA). This EUA will remain  in effect (meaning this test can be used) for the duration of the COVID-19 declaration under Section 564(b)(1) of the Act, 21 U.S.C.section 360bbb-3(b)(1), unless the authorization is terminated  or revoked sooner.       Influenza A by PCR NEGATIVE NEGATIVE Final   Influenza B by PCR NEGATIVE NEGATIVE Final    Comment: (NOTE) The Xpert Xpress SARS-CoV-2/FLU/RSV plus assay is intended as an aid in the diagnosis of influenza from Nasopharyngeal swab specimens and should not be used as a sole basis for treatment. Nasal washings and aspirates are unacceptable for Xpert Xpress SARS-CoV-2/FLU/RSV testing.  Fact Sheet for Patients: EntrepreneurPulse.com.au  Fact Sheet for Healthcare Providers: IncredibleEmployment.be  This test is not yet approved or cleared by the [place] FDA and has been authorized for detection and/or diagnosis of SARS-CoV-2 by FDA under an Emergency Use Authorization (EUA). This EUA will remain in effect (meaning this test can be used) for the duration of the COVID-19 declaration under Section 564(b)(1) of the Act, 21 U.S.C. section 360bbb-3(b)(1), unless the authorization is terminated  or revoked.  Performed at [facility], [place] [address], [place],  [address]   MRSA PCR Screening     Status: None   Collection Time: 12/28/20 10:03 AM   Specimen: Nasopharyngeal  Result Value Ref Range Status   MRSA by PCR NEGATIVE NEGATIVE Final    Comment:        The GeneXpert MRSA Assay (FDA approved for NASAL specimens only), is one component of a comprehensive MRSA colonization surveillance program. It is not intended to diagnose MRSA infection nor to guide or monitor treatment for MRSA infections. Performed at [facility], [place] [address] [place], [place] [address]      Labs: BNP (last 3 results) Recent Labs    06/21/20 0744  BNP 61.6   Basic Metabolic Panel: Recent Labs  Lab 12/26/20 1749 12/27/20 0310 12/28/20 0212 12/29/20 0700  NA 139 139 140 141  K 4.1 3.6 3.6 3.7  CL 104 105 105 105  CO2 $Re'29 27 26 26  'mnY$ GLUCOSE 146* 115* 106* 95  BUN $Re'18 12 18 21  'qcc$ CREATININE 1.21 0.96 1.24 1.24  CALCIUM 8.4* 8.7* 9.1 9.6   Liver Function Tests: Recent Labs  Lab 12/26/20 1749  AST 30  ALT 30  ALKPHOS 32*  BILITOT 0.8  PROT 6.3*  ALBUMIN 3.1*   No results for input(s): LIPASE, AMYLASE in the last 168 hours. No results for input(s): AMMONIA in the last 168 hours. CBC: Recent Labs  Lab 12/26/20 1749 12/27/20 0310 12/28/20 0212 12/29/20 0700  WBC 4.4 7.8 7.8 7.6  NEUTROABS 3.8  --   --   --   HGB 12.6* 12.2* 12.6* 14.6  HCT 38.2* 36.0* 37.8* 43.9  MCV 101.1* 98.6 98.2 98.9  PLT 130* 129* 146* 187   Cardiac Enzymes: No results for input(s): CKTOTAL, CKMB, CKMBINDEX, TROPONINI in the last 168 hours. BNP: Invalid input(s): POCBNP CBG: No results for input(s): GLUCAP in the last 168 hours. D-Dimer No results for input(s): DDIMER in the last 72 hours. Hgb A1c No results for input(s): HGBA1C in the last 72 hours. Lipid Profile No results for input(s): CHOL, HDL, LDLCALC, TRIG, CHOLHDL, LDLDIRECT in the last 72 hours. Thyroid  function studies No results for input(s): TSH, T4TOTAL, T3FREE, THYROIDAB in the last 72 hours.  Invalid input(s): FREET3 Anemia work up No results for input(s): VITAMINB12, FOLATE, FERRITIN, TIBC, IRON, RETICCTPCT in the last 72 hours. Urinalysis    Component Value Date/Time   COLORURINE YELLOW 12/26/2020 2000   APPEARANCEUR CLEAR 12/26/2020 2000   LABSPEC 1.011 12/26/2020 2000   LABSPEC 1.005 06/25/2011 1502   PHURINE 6.0 12/26/2020 2000   GLUCOSEU NEGATIVE 12/26/2020 2000   HGBUR NEGATIVE 12/26/2020 2000   BILIRUBINUR NEGATIVE 12/26/2020 2000   BILIRUBINUR Negative 06/25/2011 1502   KETONESUR NEGATIVE 12/26/2020 2000   PROTEINUR NEGATIVE 12/26/2020 2000   UROBILINOGEN 0.2 10/09/2011 1634   NITRITE NEGATIVE 12/26/2020 2000   LEUKOCYTESUR NEGATIVE 12/26/2020 2000   LEUKOCYTESUR Negative 06/25/2011 1502   Sepsis Labs Invalid input(s): PROCALCITONIN,  WBC,  LACTICIDVEN Microbiology Recent Results (from the past 240 hour(s))  Culture, blood (Routine x 2)     Status: None (Preliminary result)   Collection Time: 12/26/20  6:28 PM   Specimen: BLOOD  Result Value Ref Range Status   Specimen Description BLOOD LEFT ANTECUBITAL  Final   Special Requests   Final    BOTTLES DRAWN AEROBIC AND ANAEROBIC Blood Culture adequate volume   Culture   Final    NO GROWTH 4 DAYS Performed at [facility], [place] [address] [place], [place] [address]    Report Status PENDING  Incomplete  Urine culture     Status: Abnormal   Collection Time: 12/26/20  6:29 PM   Specimen: In/Out Cath Urine  Result Value Ref Range Status   Specimen Description IN/OUT CATH URINE  Final   Special Requests NONE  Final   Culture (A)  Final    <10,000 COLONIES/mL INSIGNIFICANT GROWTH Performed at [facility], [place] [address] [place], [place] [address]    Report Status 12/28/2020  FINAL  Final  Culture, blood (Routine x 2)     Status: None (Preliminary result)   Collection Time: 12/26/20  6:38 PM    Specimen: BLOOD  Result Value Ref Range Status   Specimen Description BLOOD BLOOD RIGHT FOREARM  Final   Special Requests   Final    BOTTLES DRAWN AEROBIC AND ANAEROBIC Blood Culture results may not be optimal due to an inadequate volume of blood received in culture bottles   Culture   Final    NO GROWTH 4 DAYS Performed at [facility], [address], [place], [place] [address]    Report Status PENDING  Incomplete  Resp Panel by RT-PCR (Flu A&B, Covid) Urine, Clean Catch     Status: None   Collection Time: 12/26/20  8:27 PM   Specimen: Urine, Clean Catch; Nasopharyngeal(NP) swabs in vial transport medium  Result Value Ref Range Status   SARS Coronavirus 2 by RT PCR NEGATIVE NEGATIVE Final    Comment: (NOTE) SARS-CoV-2 target nucleic acids are NOT DETECTED.  The SARS-CoV-2 RNA is generally detectable in upper respiratory specimens during the acute phase of infection. The lowest concentration of SARS-CoV-2 viral copies this assay can detect is 138 copies/mL. A negative result does not preclude SARS-Cov-2 infection and should not be used as the sole basis for treatment or other patient management decisions. A negative result may occur with  improper specimen collection/handling, submission of specimen other than nasopharyngeal swab, presence of viral mutation(s) within the areas targeted by this assay, and inadequate number of viral copies(<138 copies/mL). A negative result must be combined with clinical observations, patient history, and epidemiological information. The expected result is Negative.  Fact Sheet for Patients:  BloggerCourse.com  Fact Sheet for Healthcare Providers:  SeriousBroker.it  This test is no t yet approved or cleared by the [place] FDA and  has been authorized for detection and/or diagnosis of SARS-CoV-2 by FDA under an Emergency Use Authorization (EUA). This EUA will remain  in effect (meaning  this test can be used) for the duration of the COVID-19 declaration under Section 564(b)(1) of the Act, 21 U.S.C.section 360bbb-3(b)(1), unless the authorization is terminated  or revoked sooner.       Influenza A by PCR NEGATIVE NEGATIVE Final   Influenza B by PCR NEGATIVE NEGATIVE Final    Comment: (NOTE) The Xpert Xpress SARS-CoV-2/FLU/RSV plus assay is intended as an aid in the diagnosis of influenza from Nasopharyngeal swab specimens and should not be used as a sole basis for treatment. Nasal washings and aspirates are unacceptable for Xpert Xpress SARS-CoV-2/FLU/RSV testing.  Fact Sheet for Patients: BloggerCourse.com  Fact Sheet for Healthcare Providers: SeriousBroker.it  This test is not yet approved or cleared by the [place] FDA and has been authorized for detection and/or diagnosis of SARS-CoV-2 by FDA under an Emergency Use Authorization (EUA). This EUA will remain in effect (meaning this test can be used) for the duration of the COVID-19 declaration under Section 564(b)(1) of the Act, 21 U.S.C. section 360bbb-3(b)(1), unless the authorization is terminated or revoked.  Performed at [facility], [address], [place], [place] [address]   MRSA PCR Screening     Status: None   Collection Time: 12/28/20 10:03 AM   Specimen: Nasopharyngeal  Result Value Ref Range Status   MRSA by PCR NEGATIVE NEGATIVE Final    Comment:        The GeneXpert MRSA Assay (FDA approved for NASAL specimens only), is one component  of a comprehensive MRSA colonization surveillance program. It is not intended to diagnose MRSA infection nor to guide or monitor treatment for MRSA infections. Performed at [facility], [place] [address] [place], [place] [address]      Time coordinating discharge: Over 30 minutes  SIGNED:   Shawna Clamp, MD  [facility] 12/30/2020, 1:19 PM Pager   If 7PM-7AM, please  contact night-coverage [email or web address]

## 2020-12-30 NOTE — Progress Notes (Signed)
Ambulated pt in hallway. O2 saturation level remained at 93% and above during walk. No c/o SOB or respiratory distress. Pt tolerated well.

## 2020-12-30 NOTE — TOC Transition Note (Signed)
Transition of Care [facility]) - CM/SW Discharge Note   Patient Details  Name: George Bradley MRN: [id number] Date of Birth: March 02, 1941  [facility] CM/SW Contact:  Carles Collet, RN Phone Number: 12/30/2020, 12:01 PM   Clinical Narrative:   Referral placed to outpatient SLP and added to AVS.                                        Social Determinants of Health (SDOH) Interventions     Readmission Risk Interventions No flowsheet data found.

## 2020-12-31 LAB — CULTURE, BLOOD (ROUTINE X 2)
Culture: NO GROWTH
Culture: NO GROWTH
Special Requests: ADEQUATE
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
## 2021-01-01 ENCOUNTER — Ambulatory Visit: Payer: Medicare Other | Attending: Family Medicine

## 2021-01-01 ENCOUNTER — Other Ambulatory Visit: Payer: Self-pay

## 2021-01-01 DIAGNOSIS — R1312 Dysphagia, oropharyngeal phase: Secondary | ICD-10-CM | POA: Insufficient documentation

## 2021-01-01 NOTE — Therapy (Signed)
[facility] [address] [place], [place], [address] Phone: [phone]   Fax:  [phone]  Patient Details  Name: George Bradley MRN: [id number] Date of Birth: [birth year] Referring Provider:  Shawna Clamp, MD  Encounter Date: 01/01/2021   Arrive/Cancel for Speech Therapy  Soon after the encounter began it was learned that Mr. Koepp prefers to be treated by SLP at [facility] which is closer to his home. This transition was initiated with pt/wife sitting in SLP office today.  Mr. Nims will be scheduled at that facility ASAP.   [facility] [place] ,[place], [place]  01/01/2021, 5:11 PM  [facility] [address] [place], [place], [address] Phone: [phone]   Fax:  [phone]

## 2021-01-06 ENCOUNTER — Encounter (HOSPITAL_COMMUNITY): Payer: Self-pay | Admitting: Emergency Medicine

## 2021-01-06 ENCOUNTER — Other Ambulatory Visit: Payer: Self-pay

## 2021-01-06 ENCOUNTER — Emergency Department (HOSPITAL_COMMUNITY): Payer: Medicare Other

## 2021-01-06 ENCOUNTER — Observation Stay (HOSPITAL_COMMUNITY)
Admission: EM | Admit: 2021-01-06 | Discharge: 2021-01-07 | Disposition: A | Discharge status: Home / Self Care | Payer: Medicare Other | Attending: Internal Medicine | Admitting: Internal Medicine

## 2021-01-06 ENCOUNTER — Observation Stay (HOSPITAL_COMMUNITY): Payer: Medicare Other

## 2021-01-06 DIAGNOSIS — N189 Chronic kidney disease, unspecified: Secondary | ICD-10-CM | POA: Insufficient documentation

## 2021-01-06 DIAGNOSIS — I2510 Atherosclerotic heart disease of native coronary artery without angina pectoris: Secondary | ICD-10-CM | POA: Diagnosis not present

## 2021-01-06 DIAGNOSIS — E039 Hypothyroidism, unspecified: Secondary | ICD-10-CM | POA: Diagnosis present

## 2021-01-06 DIAGNOSIS — I951 Orthostatic hypotension: Secondary | ICD-10-CM | POA: Diagnosis not present

## 2021-01-06 DIAGNOSIS — Z87891 Personal history of nicotine dependence: Secondary | ICD-10-CM | POA: Insufficient documentation

## 2021-01-06 DIAGNOSIS — Z7982 Long term (current) use of aspirin: Secondary | ICD-10-CM | POA: Diagnosis not present

## 2021-01-06 DIAGNOSIS — R4182 Altered mental status, unspecified: Secondary | ICD-10-CM | POA: Diagnosis not present

## 2021-01-06 DIAGNOSIS — J690 Pneumonitis due to inhalation of food and vomit: Secondary | ICD-10-CM | POA: Diagnosis not present

## 2021-01-06 DIAGNOSIS — Z79891 Long term (current) use of opiate analgesic: Secondary | ICD-10-CM | POA: Diagnosis not present

## 2021-01-06 DIAGNOSIS — R9431 Abnormal electrocardiogram [ECG] [EKG]: Secondary | ICD-10-CM | POA: Diagnosis not present

## 2021-01-06 DIAGNOSIS — E7800 Pure hypercholesterolemia, unspecified: Secondary | ICD-10-CM | POA: Diagnosis present

## 2021-01-06 DIAGNOSIS — K219 Gastro-esophageal reflux disease without esophagitis: Secondary | ICD-10-CM | POA: Insufficient documentation

## 2021-01-06 DIAGNOSIS — R531 Weakness: Secondary | ICD-10-CM | POA: Diagnosis not present

## 2021-01-06 DIAGNOSIS — I639 Cerebral infarction, unspecified: Secondary | ICD-10-CM

## 2021-01-06 DIAGNOSIS — Z79899 Other long term (current) drug therapy: Secondary | ICD-10-CM | POA: Insufficient documentation

## 2021-01-06 DIAGNOSIS — G9340 Encephalopathy, unspecified: Secondary | ICD-10-CM | POA: Diagnosis present

## 2021-01-06 DIAGNOSIS — F0390 Unspecified dementia without behavioral disturbance: Secondary | ICD-10-CM | POA: Insufficient documentation

## 2021-01-06 DIAGNOSIS — R2681 Unsteadiness on feet: Secondary | ICD-10-CM | POA: Diagnosis not present

## 2021-01-06 DIAGNOSIS — I4510 Unspecified right bundle-branch block: Secondary | ICD-10-CM | POA: Diagnosis not present

## 2021-01-06 DIAGNOSIS — I6503 Occlusion and stenosis of bilateral vertebral arteries: Secondary | ICD-10-CM | POA: Diagnosis not present

## 2021-01-06 DIAGNOSIS — I6381 Other cerebral infarction due to occlusion or stenosis of small artery: Secondary | ICD-10-CM | POA: Diagnosis not present

## 2021-01-06 DIAGNOSIS — G319 Degenerative disease of nervous system, unspecified: Secondary | ICD-10-CM | POA: Diagnosis not present

## 2021-01-06 DIAGNOSIS — I63231 Cerebral infarction due to unspecified occlusion or stenosis of right carotid arteries: Secondary | ICD-10-CM | POA: Diagnosis not present

## 2021-01-06 DIAGNOSIS — J9601 Acute respiratory failure with hypoxia: Secondary | ICD-10-CM | POA: Diagnosis not present

## 2021-01-06 DIAGNOSIS — Z85810 Personal history of malignant neoplasm of tongue: Secondary | ICD-10-CM | POA: Diagnosis not present

## 2021-01-06 DIAGNOSIS — I440 Atrioventricular block, first degree: Secondary | ICD-10-CM | POA: Diagnosis not present

## 2021-01-06 DIAGNOSIS — I499 Cardiac arrhythmia, unspecified: Secondary | ICD-10-CM | POA: Diagnosis not present

## 2021-01-06 DIAGNOSIS — R011 Cardiac murmur, unspecified: Secondary | ICD-10-CM | POA: Insufficient documentation

## 2021-01-06 DIAGNOSIS — Z20822 Contact with and (suspected) exposure to covid-19: Secondary | ICD-10-CM | POA: Diagnosis not present

## 2021-01-06 DIAGNOSIS — R404 Transient alteration of awareness: Secondary | ICD-10-CM | POA: Diagnosis not present

## 2021-01-06 DIAGNOSIS — R29898 Other symptoms and signs involving the musculoskeletal system: Secondary | ICD-10-CM | POA: Diagnosis not present

## 2021-01-06 DIAGNOSIS — I491 Atrial premature depolarization: Secondary | ICD-10-CM | POA: Diagnosis not present

## 2021-01-06 LAB — URINALYSIS, ROUTINE W REFLEX MICROSCOPIC
Bilirubin Urine: NEGATIVE
Glucose, UA: NEGATIVE mg/dL
Hgb urine dipstick: NEGATIVE
Ketones, ur: NEGATIVE mg/dL
Leukocytes,Ua: NEGATIVE
Nitrite: NEGATIVE
Protein, ur: NEGATIVE mg/dL
Specific Gravity, Urine: 1.028 (ref 1.005–1.030)
pH: 7 (ref 5.0–8.0)

## 2021-01-06 LAB — RAPID URINE DRUG SCREEN, HOSP PERFORMED
Amphetamines: NOT DETECTED
Barbiturates: NOT DETECTED
Benzodiazepines: POSITIVE — AB
Cocaine: NOT DETECTED
Opiates: NOT DETECTED
Tetrahydrocannabinol: NOT DETECTED

## 2021-01-06 LAB — CBC
HCT: 40.5 % (ref 39.0–52.0)
Hemoglobin: 13 g/dL (ref 13.0–17.0)
MCH: 32.5 pg (ref 26.0–34.0)
MCHC: 32.1 g/dL (ref 30.0–36.0)
MCV: 101.3 fL — ABNORMAL HIGH (ref 80.0–100.0)
Platelets: 222 10*3/uL (ref 150–400)
RBC: 4 MIL/uL — ABNORMAL LOW (ref 4.22–5.81)
RDW: 13.9 % (ref 11.5–15.5)
WBC: 14.5 10*3/uL — ABNORMAL HIGH (ref 4.0–10.5)
nRBC: 0 % (ref 0.0–0.2)

## 2021-01-06 LAB — COMPREHENSIVE METABOLIC PANEL
ALT: 35 U/L (ref 0–44)
AST: 34 U/L (ref 15–41)
Albumin: 3.7 g/dL (ref 3.5–5.0)
Alkaline Phosphatase: 37 U/L — ABNORMAL LOW (ref 38–126)
Anion gap: 9 (ref 5–15)
BUN: 24 mg/dL — ABNORMAL HIGH (ref 8–23)
CO2: 26 mmol/L (ref 22–32)
Calcium: 8.9 mg/dL (ref 8.9–10.3)
Chloride: 104 mmol/L (ref 98–111)
Creatinine, Ser: 1.35 mg/dL — ABNORMAL HIGH (ref 0.61–1.24)
GFR, Estimated: 53 mL/min — ABNORMAL LOW (ref >60)
Glucose, Bld: 168 mg/dL — ABNORMAL HIGH (ref 70–99)
Potassium: 4.3 mmol/L (ref 3.5–5.1)
Sodium: 139 mmol/L (ref 135–145)
Total Bilirubin: 0.3 mg/dL (ref 0.3–1.2)
Total Protein: 7.6 g/dL (ref 6.5–8.1)

## 2021-01-06 LAB — RESP PANEL BY RT-PCR (FLU A&B, COVID) ARPGX2
Influenza A by PCR: NEGATIVE
Influenza B by PCR: NEGATIVE
SARS Coronavirus 2 by RT PCR: NEGATIVE

## 2021-01-06 LAB — DIFFERENTIAL
Abs Immature Granulocytes: 0.08 10*3/uL — ABNORMAL HIGH (ref 0.00–0.07)
Basophils Absolute: 0 10*3/uL (ref 0.0–0.1)
Basophils Relative: 0 %
Eosinophils Absolute: 0.1 10*3/uL (ref 0.0–0.5)
Eosinophils Relative: 1 %
Immature Granulocytes: 1 %
Lymphocytes Relative: 7 %
Lymphs Abs: 1 10*3/uL (ref 0.7–4.0)
Monocytes Absolute: 0.7 10*3/uL (ref 0.1–1.0)
Monocytes Relative: 5 %
Neutro Abs: 12.6 10*3/uL — ABNORMAL HIGH (ref 1.7–7.7)
Neutrophils Relative %: 86 %

## 2021-01-06 LAB — APTT: aPTT: 23 seconds — ABNORMAL LOW (ref 24–36)

## 2021-01-06 LAB — CBG MONITORING, ED: Glucose-Capillary: 97 mg/dL (ref 70–99)

## 2021-01-06 LAB — PROTIME-INR
INR: 1 (ref 0.8–1.2)
Prothrombin Time: 12.8 seconds (ref 11.4–15.2)

## 2021-01-06 LAB — ETHANOL: Alcohol, Ethyl (B): <10 mg/dL (ref <10)

## 2021-01-06 MED ORDER — AMOXICILLIN-POT CLAVULANATE 875-125 MG PO TABS
1.0000 | ORAL_TABLET | Freq: Two times a day (BID) | ORAL | Status: AC
  Administered 2021-01-06 (×2): 1 via ORAL
  Filled 2021-01-06 (×2): qty 1

## 2021-01-06 MED ORDER — AMITRIPTYLINE HCL 25 MG PO TABS
25.0000 mg | ORAL_TABLET | Freq: Every evening | ORAL | Status: DC | PRN
  Administered 2021-01-06: 25 mg via ORAL
  Filled 2021-01-06: qty 1

## 2021-01-06 MED ORDER — ACETAMINOPHEN 325 MG PO TABS: 650.0000 mg | ORAL_TABLET | ORAL | Status: DC | PRN

## 2021-01-06 MED ORDER — FLUDROCORTISONE ACETATE 0.1 MG PO TABS
0.1000 mg | ORAL_TABLET | Freq: Every day | ORAL | Status: DC
  Administered 2021-01-06 – 2021-01-07 (×2): 0.1 mg via ORAL
  Filled 2021-01-06 (×2): qty 1

## 2021-01-06 MED ORDER — HEPARIN SODIUM (PORCINE) 5000 UNIT/ML IJ SOLN
5000.0000 [IU] | Freq: Three times a day (TID) | INTRAMUSCULAR | Status: DC
  Administered 2021-01-06 – 2021-01-07 (×3): 5000 [IU] via SUBCUTANEOUS
  Filled 2021-01-06 (×3): qty 1

## 2021-01-06 MED ORDER — TICAGRELOR 90 MG PO TABS
90.0000 mg | ORAL_TABLET | Freq: Two times a day (BID) | ORAL | Status: DC
  Administered 2021-01-06 – 2021-01-07 (×3): 90 mg via ORAL
  Filled 2021-01-06 (×4): qty 1

## 2021-01-06 MED ORDER — DONEPEZIL HCL 5 MG PO TABS
5.0000 mg | ORAL_TABLET | Freq: Every day | ORAL | Status: DC
  Administered 2021-01-06: 5 mg via ORAL
  Filled 2021-01-06: qty 1

## 2021-01-06 MED ORDER — TRAMADOL HCL 50 MG PO TABS: 50.0000 mg | ORAL_TABLET | Freq: Two times a day (BID) | ORAL | Status: DC | PRN

## 2021-01-06 MED ORDER — BUSPIRONE HCL 10 MG PO TABS: 5.0000 mg | ORAL_TABLET | Freq: Two times a day (BID) | ORAL | Status: DC | PRN

## 2021-01-06 MED ORDER — ROSUVASTATIN CALCIUM 20 MG PO TABS
20.0000 mg | ORAL_TABLET | Freq: Every day | ORAL | Status: DC
  Administered 2021-01-06 – 2021-01-07 (×2): 20 mg via ORAL
  Filled 2021-01-06 (×3): qty 1

## 2021-01-06 MED ORDER — LEVOTHYROXINE SODIUM 100 MCG PO TABS
100.0000 ug | ORAL_TABLET | Freq: Every day | ORAL | Status: DC
  Administered 2021-01-07: 100 ug via ORAL
  Filled 2021-01-06: qty 1

## 2021-01-06 MED ORDER — LORAZEPAM 1 MG PO TABS
1.0000 mg | ORAL_TABLET | Freq: Every day | ORAL | Status: DC
  Administered 2021-01-06 – 2021-01-07 (×2): 1 mg via ORAL
  Filled 2021-01-06 (×2): qty 1

## 2021-01-06 MED ORDER — ASPIRIN EC 81 MG PO TBEC
81.0000 mg | DELAYED_RELEASE_TABLET | Freq: Every day | ORAL | Status: DC
  Administered 2021-01-06 – 2021-01-07 (×2): 81 mg via ORAL
  Filled 2021-01-06 (×2): qty 1

## 2021-01-06 MED ORDER — SODIUM CHLORIDE 0.9 % IV SOLN: INTRAVENOUS | Status: DC

## 2021-01-06 MED ORDER — STROKE: EARLY STAGES OF RECOVERY BOOK
Freq: Once | Status: AC
  Filled 2021-01-06: qty 1

## 2021-01-06 MED ORDER — ACETAMINOPHEN 160 MG/5ML PO SOLN: 650.0000 mg | ORAL | Status: DC | PRN

## 2021-01-06 MED ORDER — GABAPENTIN 300 MG PO CAPS
900.0000 mg | ORAL_CAPSULE | Freq: Three times a day (TID) | ORAL | Status: DC
  Administered 2021-01-06 – 2021-01-07 (×4): 900 mg via ORAL
  Filled 2021-01-06 (×4): qty 3

## 2021-01-06 MED ORDER — AMITRIPTYLINE HCL 25 MG PO TABS: 25.0000 mg | ORAL_TABLET | Freq: Every evening | ORAL | Status: DC | PRN

## 2021-01-06 MED ORDER — ACETAMINOPHEN 650 MG RE SUPP: 650.0000 mg | RECTAL | Status: DC | PRN

## 2021-01-06 MED ORDER — IOHEXOL 350 MG/ML SOLN
100.0000 mL | Freq: Once | INTRAVENOUS | Status: AC | PRN
  Administered 2021-01-06: 100 mL via INTRAVENOUS

## 2021-01-06 NOTE — Consult Note (Signed)
[facility] TeleNeurology Consult Services   Date of Service:   01/06/2021 03:09:44  Diagnosis:     .  G45.9 - Transient cerebral ischemic attack, unspecified  Impression:     . George Bradley is an 80 year old male evaluated for his AMS and left sided weakness. It appears this was hypo perfusion and his symptoms have resolved since his BP increased. However, his head CT shows a new stroke since his last stroke in the right putamen. This appears subacute. The patient is not a TPA candidate.  His CTA shows a new embolus but he is not a thrombectomy candidate as his symptoms have resolved.   Metrics: Last Known Well: 01/05/2021 21:00:00 [facility] Notification Time: 01/06/2021 03:09:43 Arrival Time: 01/06/2021 02:59:00 Stamp Time: 01/06/2021 03:09:44 Initial Response Time: 01/06/2021 03:12:29 Symptoms: AMS with left weakness. NIHSS Start Assessment Time: 01/06/2021 03:33:56 Patient is not a candidate for Thrombolytic. Thrombolytic Medical Decision: 01/06/2021 03:15:55 Patient was not deemed candidate for Thrombolytic because of following reasons: Last Well Known Above 4.5 Hours. Stroke in last 3 months.  CT head showed no acute hemorrhage or acute core infarct.  ED Physician notified of diagnostic impression and management plan on 01/06/2021 03:47:22  Advanced Imaging: CTA Head and Neck Completed.  LVO:No  Patient doesn't meet criteria for emergent NIR consideration   Our recommendations are outlined below.  Recommendations:      .  Stroke/Telemetry Floor     .  Neuro Checks     .  Bedside Swallow Eval     .  DVT Prophylaxis     .  IV Fluids, Normal Saline     .  Head of Bed 30 Degrees     .  Euglycemia and Avoid Hyperthermia (PRN Acetaminophen)  Routine Consultation with [facility] for Follow up Care  Sign Out:     .  Discussed with Emergency Department  Provider    ------------------------------------------------------------------------------  History of Present Illness: Patient is a 80 year old Male.  Patient was brought by EMS for symptoms of AMS with left weakness.  George Bradley is an 80 year old male with CAD, hypothyroidism, dementia and recent right MCA stroke who presented to the ER for evaluation of AMS and left sided weakness. On initial presentation, he was unresponsive with SBP of 60. As his BP increased he became more responsive and is now moving all extremities and he is oriented  Last seen normal was beyond 4.5 hours of presentation. There is no history of hemorrhagic complications or intracranial hemorrhage. There is no history of Recent Anticoagulants. There is no history of recent major surgery. There is history of recent stroke.  Past Medical History:     . Coronary Artery Disease     . Stroke  Social History: Smoking: No Alcohol Use: No Drug Use: No  Family History:     . noncontributory  Review of System:  14 Points Review of Systems was performed and was negative except mentioned in HPI.  Anticoagulant use:  No  Antiplatelet use: Yes ASA, Brilinta  Allergies:  Reviewed    Examination: BP(110/78), Pulse(87), Blood Glucose(135) 1A: Level of Consciousness - Alert; keenly responsive + 0 1B: Ask Month and Age - Both Questions Right + 0 1C: Blink Eyes & Squeeze Hands - Performs Both Tasks + 0 2: Test Horizontal Extraocular Movements - Normal + 0 3: Test Visual Fields - No Visual Loss + 0 4: Test Facial Palsy (Use Grimace if Obtunded) - Normal symmetry + 0  5A: Test Left Arm Motor Drift - No Drift for 10 Seconds + 0 5B: Test Right Arm Motor Drift - No Drift for 10 Seconds + 0 6A: Test Left Leg Motor Drift - No Drift for 5 Seconds + 0 6B: Test Right Leg Motor Drift - No Drift for 5 Seconds + 0 7: Test Limb Ataxia (FNF/Heel-Shin) - No Ataxia + 0 8: Test Sensation - Normal; No sensory loss + 0 9: Test  Language/Aphasia - Normal; No aphasia + 0 10: Test Dysarthria - Normal + 0 11: Test Extinction/Inattention - No abnormality + 0  NIHSS Score: 0   Pre-Morbid Modified Rankin Scale: 3 Points = Moderate disability; requiring some help, but able to walk without assistance   Patient/Family was informed the Neurology Consult would occur via TeleHealth consult by way of interactive audio and video telecommunications and consented to receiving care in this manner.   Patient is being evaluated for possible acute neurologic impairment and high probability of imminent or life-threatening deterioration. I spent total of 39 minutes providing care to this patient, including time for face to face visit via telemedicine, review of medical records, imaging studies and discussion of findings with providers, the patient and/or family.   Dr Birdena Jubilee   [facility] [phone]  Case [id number]

## 2021-01-06 NOTE — Evaluation (Signed)
Physical Therapy Evaluation Patient Details Name: George Bradley MRN: [id number] DOB: 04-03-1941 Today's Date: 01/06/2021   History of Present Illness  Patient is a 80 y/o male who presents on 01/06/21 with left sided weakness and AMS. Head CT- new right putamen infarct. 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 PMH includes bil renal artery stenosis, CKD, CAD, chronic orthostatic, tongue cancer with chronic dysphagia.  Clinical Impression  Patient presents with orthostatic hypotension, impaired balance and impaired mobility s/p above. Pt lives at home with wife and reports being independent for ADLs and walking PTA. Today, pt tolerated transfers and gait training with Min guard-Min A for safety. Declines using DME as he does not use any at home. Pt has supportive wife at home that is a retired PT. Reviewed importance of waiting a few mins after change in position prior to mobilizing due to orthostatic hypotension (which is chronic). Pt asymptomatic today during standing despite drop in BP. Will plan for stair training and higher level balance activities next session as tolerated to prepare for d/c home. Will likely progress well. Will follow acutely to maximize independence and mobility prior to return home.  Supine BP 114/80, HR 85 bpm Sitting BP 104/73, HR 87 bpm Standing BP 72/54, HR 115 bpm Sitting BP post walk 97/83, HR 150 bpm      Follow Up Recommendations No PT follow up;Supervision - Intermittent    Equipment Recommendations  None recommended by PT    Recommendations for Other Services       Precautions / Restrictions Precautions Precautions: Fall;Other (comment) Precaution Comments: orthostatic Restrictions Weight Bearing Restrictions: No      Mobility  Bed Mobility Overal bed mobility: Needs Assistance Bed Mobility: Supine to Sit      Supine to sit: Supervision;HOB elevated     General bed mobility comments: No assist needed.    Transfers Overall transfer level: Needs assistance Equipment used: None Transfers: Sit to/from Stand Sit to Stand: Min guard;Min assist         General transfer comment: Min guard for safety. Min A initially to gain balance once upright. Stood from [employer].  Ambulation/Gait Ambulation/Gait assistance: Min guard Gait Distance (Feet): 150 Feet Assistive device: None Gait Pattern/deviations: Step-through pattern;Decreased step length - right;Decreased step length - left;Decreased stride length;Narrow base of support Gait velocity: reduced   General Gait Details: Slow, mildly unsteady gait with shortened step lengths initially progressing to longer step lengths with cues, no dizziness. Discomfort at calves due to recent cramping.  Stairs            Wheelchair Mobility    Modified Rankin (Stroke Patients Only) Modified Rankin (Stroke Patients Only) Pre-Morbid Rankin Score: Slight disability Modified Rankin: Moderately severe disability     Balance Overall balance assessment: Needs assistance Sitting-balance support: Feet supported;No upper extremity supported Sitting balance-Leahy Scale: Good Sitting balance - Comments: Able to reach outside BoS and pick up something off floor without LOB sittnig EOB.   Standing balance support: During functional activity Standing balance-Leahy Scale: Poor Standing balance comment: Min A initially due to imbalance progressing to min guard.                             Pertinent Vitals/Pain Pain Assessment: No/denies pain    Home Living Family/patient expects to be discharged  to:: (P) Private residence Living Arrangements: (P) Spouse/significant other Available Help at Discharge: Family;Available 24 hours/day Type of Home: House Home Access: Stairs to enter Entrance Stairs-Rails: Right Entrance Stairs-Number of Steps:  3 thru the garage Home Layout: Two level;Able to live on main level with bedroom/bathroom Home Equipment: Shower seat - built in;Hand held Tourist information centre manager - 2 wheels;Cane - single point;Cane - quad;Wheelchair - [employer] - 4 wheels Additional Comments: Wife is a retired acute PT    Prior Function Level of Independence: Independent         Comments: Performing ADLs, light IADLs- riding lawn mower, and driving .     Hand Dominance   Dominant Hand: Right    Extremity/Trunk Assessment   Upper Extremity Assessment Upper Extremity Assessment: Defer to OT evaluation    Lower Extremity Assessment Lower Extremity Assessment: Overall WFL for tasks assessed;RLE deficits/detail RLE Deficits / Details: Grossly ~4-5/5 throughout RLE Sensation: history of peripheral neuropathy LLE Deficits / Details: Grossly ~4-5/5 throughout LLE Sensation: history of peripheral neuropathy    Cervical / Trunk Assessment Cervical / Trunk Assessment: Normal  Communication   Communication: No difficulties  Cognition Arousal/Alertness: Awake/alert Behavior During Therapy: WFL for tasks assessed/performed Overall Cognitive Status: History of cognitive impairments - at baseline                                 General Comments: At baseline per wife. Able to participate in conversation and follow cues. Jokes a lot- baseline.      General Comments General comments (skin integrity, edema, etc.): Wife present during session. HR up to 150s bpm with activity.    Exercises     Assessment/Plan    PT Assessment Patient needs continued PT services  PT Problem List Decreased activity tolerance;Decreased balance;Decreased mobility;Decreased knowledge of use of DME;Decreased safety awareness;Decreased knowledge of precautions;Decreased cognition       PT Treatment Interventions DME instruction;Gait training;Stair training;Functional mobility training;Therapeutic activities;Therapeutic  exercise;Balance training;Neuromuscular re-education;Cognitive remediation;Patient/family education    PT Goals (Current goals can be found in the Care Plan section)  Acute Rehab PT Goals Patient Stated Goal: go home PT Goal Formulation: With patient/family Time For Goal Achievement: 01/20/21 Potential to Achieve Goals: Good    Frequency Min 4X/week   Barriers to discharge        Co-evaluation               AM-PAC PT "6 Clicks" Mobility  Outcome Measure Help needed turning from your back to your side while in a flat bed without using bedrails?: None Help needed moving from lying on your back to sitting on the side of a flat bed without using bedrails?: None Help needed moving to and from a bed to a chair (including a wheelchair)?: A Little Help needed standing up from a chair using your arms (e.g., wheelchair or bedside chair)?: A Little Help needed to walk in hospital room?: A Little Help needed climbing 3-5 steps with a railing? : A Little 6 Click Score: 20    End of Session Equipment Utilized During Treatment: Gait belt Activity Tolerance: Patient tolerated treatment well Patient left: in bed;with call bell/phone within reach (sitting EOB with MD and wife present) Nurse Communication: Mobility status PT Visit Diagnosis: Unsteadiness on feet (R26.81);Other abnormalities of gait and mobility (R26.89);Difficulty in walking, not elsewhere classified (R26.2);Other symptoms and signs involving the nervous system (Y10.175)    Time: 1025-8527 PT Time Calculation (  min) (ACUTE ONLY): 34 min   Charges:   PT Evaluation $PT Eval Moderate Complexity: 1 Mod PT Treatments $Gait Training: 8-22 mins        Marisa Severin, PT, DPT Acute Rehabilitation Services Pager [phone] Office [place] 01/06/2021, 3:46 PM

## 2021-01-06 NOTE — Consult Note (Signed)
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 sinus. IMPRESSION: A small cortically based infarct within the left occipital lobe (PCA vascular territory) is new from the brain MRI of 06/14/2017, but otherwise age-indeterminate. Correlate with findings on the pending brain MRI. Stable mild generalized parenchymal atrophy. Small right frontal sinus mucous retention cyst. Electronically Signed   By: Kellie Simmering DO   On: 12/19/2020 14:18   CT Angio Neck W and/or Wo Contrast  Result Date: 01/06/2021 CLINICAL DATA:  Initial evaluation for acute stroke, left-sided weakness, now resolved. EXAM: CT ANGIOGRAPHY HEAD AND NECK CT PERFUSION BRAIN TECHNIQUE: Multidetector CT imaging of the head and neck was performed using the standard protocol during bolus administration of intravenous contrast. Multiplanar CT image reconstructions and MIPs were obtained to evaluate the vascular anatomy. Carotid stenosis measurements (when applicable) are obtained utilizing NASCET criteria, using the  distal internal carotid diameter as the denominator. Multiphase CT imaging of the brain was performed following IV bolus contrast injection. Subsequent parametric perfusion maps were calculated using RAPID software. CONTRAST:  159mL OMNIPAQUE IOHEXOL 350 MG/ML SOLN COMPARISON:  Prior CT from earlier the same day as well as previous studies from 12/20/2020 and 12/19/2020. FINDINGS: CTA NECK FINDINGS Aortic arch: Visualized aortic arch normal in caliber with normal 3 vessel morphology. Moderate atheromatous change about the arch and origin of the great vessels without high-grade stenosis. Right carotid  system: Right CCA is now widely patent from its origin to the bifurcation. Intravascular stent traverses the right bifurcation extending into the proximal right ICA. Moderate stenosis of the stent at the level of the native bifurcation of up to approximately 50%. Stent is otherwise widely patent without intraluminal thrombus or other complication. Right ICA patent distally to the skull base without stenosis, dissection or occlusion. Left carotid system: Left CCA tortuous proximally but is widely patent to the bifurcation without stenosis. Mild calcified plaque about the left bifurcation/proximal left ICA without significant stenosis. Left ICA patent distally to the skull base without stenosis, dissection or occlusion. Vertebral arteries: Both vertebral arteries arise from the subclavian arteries. Calcified plaque at the origin of the right subclavian artery with associated mild stenosis again noted. Atheromatous plaque at the origins of both vertebral arteries M cells with associated mild stenosis noted as well, also stable. Left vertebral artery slightly dominant. Vertebral arteries otherwise remain widely patent within the neck without stenosis, dissection or occlusion. Skeleton: No acute osseous finding. No discrete or worrisome osseous lesions. Mild multilevel degenerative spondylosis and facet arthrosis without  high-grade stenosis. Other neck: No other acute soft tissue abnormality within the neck. No mass or adenopathy. Post radiation changes again noted. Upper chest: Post radiation scarring noted at the upper lungs bilaterally. Visualized upper chest demonstrates no other acute finding. Review of the MIP images confirms the above findings CTA HEAD FINDINGS Anterior circulation: Petrous segments are now both widely patent. Atheromatous plaque throughout the carotid siphons with no more than mild stenosis at the para clinoid segments. A1 segments widely patent. Normal anterior communicating artery complex. Anterior cerebral arteries remain widely patent within the neck. No M1 stenosis or occlusion. Right M1 bifurcates early. No proximal MCA branch occlusion. Distal MCA branches well perfused and symmetric. Posterior circulation: Both V4 segments remain patent to the vertebrobasilar junction. Both PICA origins are patent and normal. Basilar widely patent to its distal aspect. Superior cerebellar arteries patent bilaterally. Both PCAs are primarily supplied via the basilar. Mild atheromatous irregularity within the PCAs bilaterally without significant stenosis. Venous sinuses: Grossly patent allowing for timing the contrast bolus. Anatomic variants: None significant.  No aneurysm. Review of the MIP images confirms the above findings CT Brain Perfusion Findings: ASPECTS: 9. IMPRESSION: CTA HEAD AND NECK IMPRESSION: 1. Negative CTA for emergent large vessel occlusion. 2. Interval revascularization of the right common and internal carotid arteries since previous exam, with vascular stent in place across the right carotid bifurcation. Focal narrowing of the stent by approximately 50% at the level of the native bifurcation. Otherwise, widely patent flow seen through the stent. 3. 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 4. Mild bilateral vertebral artery origins stenoses, also stable. 5.  Aortic  Atherosclerosis (ICD10-I70.0). CT PERFUSION IMPRESSION: The CT perfusion portion of this exam unfortunately failed, with no perfusion maps generated. Results were called by telephone at the time of interpretation on 01/06/2021 at 4:00 am to provider [facility] , who verbally acknowledged these results. Electronically Signed   By: Jeannine Boga M.D.   On: 01/06/2021 04:45   CT Angio Neck W and/or Wo Contrast  Result Date: 12/19/2020 CLINICAL DATA:  Worsening weakness and dizzy spells for 1 month. Small acute right MCA infarcts on MRI with evidence of right ICA occlusion. EXAM: CT ANGIOGRAPHY HEAD AND NECK TECHNIQUE: Multidetector CT imaging of the head and neck was performed using the standard protocol during bolus administration of intravenous contrast. Multiplanar CT image reconstructions and MIPs  were obtained to evaluate the vascular anatomy. Carotid stenosis measurements (when applicable) are obtained utilizing NASCET criteria, using the distal internal carotid diameter as the denominator. CONTRAST:  126mL OMNIPAQUE IOHEXOL 350 MG/ML SOLN COMPARISON:  Head MRI 12/19/2020 FINDINGS: CTA NECK FINDINGS Aortic arch: Standard 3 vessel aortic arch with mild-to-moderate calcified plaque. No evidence of significant arch vessel origin stenosis. Right carotid system: The common carotid artery is occluded at its origin without common or internal carotid artery reconstitution in the neck. There is reconstitution of ECA branches. Left carotid system: Patent with mild calcified plaque at the carotid bifurcation. No evidence of significant stenosis or dissection. Vertebral arteries: Calcified plaque results in mild stenosis of the proximal right subclavian artery. The vertebral arteries are patent with the left being mildly dominant. There is mild stenosis of both vertebral artery origins due to calcified plaque. Skeleton: Moderate cervical disc and facet degeneration. Other neck: No evidence of cervical  lymphadenopathy or mass. Upper chest: Scarring/potential post radiation fibrosis in the lung apices. Coronary atherosclerosis. Review of the MIP images confirms the above findings CTA HEAD FINDINGS Anterior circulation: There is partial reconstitution of the petrous segment of the right ICA, however the cavernous segment appears largely occluded. There is more robust reconstitution of the right supraclinoid ICA. The intracranial left ICA is patent with mild calcified plaque not resulting in significant stenosis. ACAs and MCAs are patent without evidence of a proximal branch occlusion or significant proximal stenosis. There is slight diffuse asymmetric attenuation of the right MCA compared to the left. No aneurysm is identified. Posterior circulation: The intracranial vertebral arteries are patent with to the basilar at most mild atherosclerotic narrowing bilaterally. Patent PICA and SCA origins are identified bilaterally. The basilar artery is widely patent. There is a small right posterior communicating artery. Both PCAs are patent with mild atherosclerotic irregularity but no evidence of a significant proximal stenosis. No aneurysm is identified. Venous sinuses: Patent. Anatomic variants: None. Review of the MIP images confirms the above findings IMPRESSION: 1. 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 2. Mild bilateral vertebral artery origin stenoses. 3. Left-sided carotid atherosclerosis without significant stenosis. 4. Aortic Atherosclerosis (ICD10-I70.0). Electronically Signed   By: Logan Bores M.D.   On: 12/19/2020 16:31   MR BRAIN WO CONTRAST  Result Date: 12/20/2020 CLINICAL DATA:  80 year old male found to have right carotid occlusion, two small acute right MCA cortical infarcts on MRI yesterday. And repeat neck CTA yesterday demonstrating subsequent acute large vessel occlusion at the right MCA bifurcation. Then status post  endovascular thrombectomy. EXAM: MRI HEAD WITHOUT CONTRAST TECHNIQUE: Multiplanar, multiecho pulse sequences of the brain and surrounding structures were obtained without intravenous contrast. COMPARISON:  Brain MRI 1418 hours yesterday, along with CTA head and neck x2 yesterday. FINDINGS: Brain: There remain only a few small cortical and subcortical white matter infarcts scattered in the right MCA territory, such as on series 2, image 40. About six such areas are present now, versus the two seen yesterday. No contralateral left hemisphere or posterior fossa restricted diffusion. Minor cytotoxic edema in the affected areas with no acute hemorrhage. No mass effect. A few scattered chronic microhemorrhages in the brain, mostly the left hemisphere, are stable since yesterday. Stable pre-existing scattered white matter T2 and FLAIR hyperintensity, and a small area of chronic encephalomalacia in the left occipital pole. Stable small chronic left cerebellar infarct near midline. No midline shift, mass effect, evidence of mass lesion, ventriculomegaly, extra-axial collection  or acute intracranial hemorrhage. Cervicomedullary junction and pituitary are within normal limits. Vascular: Major intracranial vascular flow voids are now normalized, including the right ICA siphon. Skull and upper cervical spine: Stable and negative for age. Sinuses/Orbits: Stable and negative. Other: Trace mastoid fluid is stable. Negative visible scalp and face. IMPRESSION: 1. Following right carotid and MCA revascularization there are only a few scattered small cortical and subcortical white matter infarcts in the right MCA territory, in addition to the two seen by MRI yesterday. No hemorrhage or mass effect. 2. Small chronic left PCA territory infarct, small left cerebellar infarct. Mild for age white matter signal changes. Electronically Signed   By: Genevie Ann M.D.   On: 12/20/2020 06:59   MR BRAIN WO CONTRAST  Result Date:  12/19/2020 CLINICAL DATA:  Worsening ataxia over the past few weeks. EXAM: MRI HEAD WITHOUT CONTRAST TECHNIQUE: Multiplanar, multiecho pulse sequences of the brain and surrounding structures were obtained without intravenous contrast. COMPARISON:  Head CT 12/19/2020 and MRI 06/14/2017 FINDINGS: Brain: There are subcentimeter acute cortical infarcts at the right temporoparietal junction as well as more superiorly in the right parietal lobe. The small left occipital infarct seen on CT is chronic. There is also a small chronic cortical infarct anterolaterally in the right frontal lobe. There is a small chronic infarct medially in the left cerebellar hemisphere. Scattered small foci of T2 hyperintensity in the cerebral white matter and pons have slightly progressed from the prior MRI and are nonspecific but compatible with mild chronic small vessel ischemic disease. There is mild generalized cerebral atrophy. Scattered chronic microhemorrhages in both cerebral hemispheres have slightly increased in number from the prior MRI. No mass, midline shift, or extra-axial fluid collection is identified. Vascular: Loss of the normal flow void of the distal cervical and proximal intracranial portions of the right internal carotid artery with reconstitution of the supraclinoid ICA flow void. Skull and upper cervical spine: Unremarkable bone marrow signal. Sinuses/Orbits: Bilateral cataract extraction. Minimal mucosal thickening in the paranasal sinuses. Small right mastoid effusion. Other: None. IMPRESSION: 1. Two small acute cortical infarcts in the right MCA territory. 2. Multiple small chronic cerebral and cerebellar infarcts as above. 3. Absent flow void in the distal cervical and proximal intracranial right internal carotid consistent with occlusion, new from 2018. Electronically Signed   By: Logan Bores M.D.   On: 12/19/2020 15:06   CT CHEST ABDOMEN PELVIS W CONTRAST  Result Date: 12/26/2020 CLINICAL DATA:  Abdominal  pain, fever EXAM: CT CHEST, ABDOMEN, AND PELVIS WITH CONTRAST TECHNIQUE: Multidetector CT imaging of the chest, abdomen and pelvis was performed following the standard protocol during bolus administration of intravenous contrast. CONTRAST:  100 mL Omnipaque 300 IV COMPARISON:  CT abdomen 08/17/2020. FINDINGS: CT CHEST FINDINGS Cardiovascular: Cardiomegaly. Prior CABG. Diffuse aortic atherosclerosis. No aneurysm. Mediastinum/Nodes: No mediastinal, hilar, or axillary adenopathy. Trachea and esophagus are unremarkable. Thyroid unremarkable. Lungs/Pleura: Airspace disease noted throughout the right middle lobe and both lower lobes most compatible with pneumonia. No effusions. Musculoskeletal: Chest wall soft tissues are unremarkable. No acute bony abnormality. CT ABDOMEN PELVIS FINDINGS Hepatobiliary: No focal hepatic abnormality. Gallbladder unremarkable. Pancreas: No focal abnormality or ductal dilatation. Spleen: No focal abnormality.  Normal size. Adrenals/Urinary Tract: No hydronephrosis or ureteral stones. Small cyst in the midpole of the right kidney. Adrenal glands and urinary bladder unremarkable. Stomach/Bowel: Large stool burden throughout the colon. No evidence of bowel obstruction. Appendix is normal. Vascular/Lymphatic: Aortoiliac atherosclerosis. No evidence of aneurysm or adenopathy. Reproductive: No visible focal  abnormality. Other: No free fluid or free air. Musculoskeletal: No acute bony abnormality. Degenerative changes in the lumbar spine. IMPRESSION: Bilateral airspace disease throughout the right middle lobe and both lower lobes compatible with pneumonia. Large stool burden in the colon. Prior CABG.  Diffuse aortic atherosclerosis. Electronically Signed   By: Rolm Baptise M.D.   On: 12/26/2020 22:56   IR CT Head Ltd  Result Date: 12/21/2020 INDICATION: 80 year old male with past medical history significant for aortic atherosclerosis, bilateral renal artery stenosis, chronic kidney disease,  coronary artery, gastroesophageal reflux disease, hyperlipidemia, chronic orthostatic hypotension, and BPH. He initially presented to [facility] complaining of generalized weakness, tremors and vision changes. 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 During transferred to [facility] he was noted to have sudden change in his neurological exam with sudden onset of left-sided weakness. Repeat CT angiogram showed a new right MCA/M1 occlusion in addition to his cervical occlusion. Acute neuro changes noted at 17:40 on 12/19/2020, NIHSS 19 at presentation to [facility], modified Rankin scale 0. He was then transferred to our service for emergency cerebral angiogram and thrombectomy. EXAM: ULTRASOUND-GUIDED VASCULAR ACCESS DIAGNOSTIC CEREBRAL ANGIOGRAM MECHANICAL THROMBECTOMY FLAT PANEL HEAD CT COMPARISON:  CT/CT angiogram of the head and neck December 19, 2020. MEDICATIONS: Cangrelor bolus and drip, IV. ANESTHESIA/SEDATION: The procedure was performed under general anesthesia. CONTRAST:  75 mL of Omnipaque 240 milligrams/mL. FLUOROSCOPY TIME:  Fluoroscopy Time: 30 minutes 24 seconds (941.1 mGy). COMPLICATIONS: None immediate. TECHNIQUE: Informed written consent was obtained from the patient's spouse after a thorough discussion of the procedural risks, benefits and alternatives. All questions were addressed. Maximal Sterile Barrier Technique was utilized including caps, mask, sterile gowns, sterile gloves, sterile drape, hand hygiene and skin antiseptic. A timeout was performed prior to the initiation of the procedure. The right groin was prepped and draped in the usual sterile fashion. Using a micropuncture kit and the modified Seldinger technique, access was gained to the right common femoral artery and an 8 French sheath was placed. Real-time ultrasound guidance was utilized for vascular access including the  acquisition of a permanent ultrasound image documenting patency of the accessed vessel. Under fluoroscopy, an 8 [place] Walrus balloon guide catheter was navigated over a 6 [place] Berenstein 2 catheter and a 0.035" Terumo Glidewire into the aortic arch. The catheter was placed into the right common carotid artery frontal and lateral angiograms of the neck were obtained. FINDINGS: Filling defect consistent with clot within the right common carotid artery, along the carotid bifurcation with contrast penetration into the cervical right ICA. Large filling defect within the bulb and proximal cervical right ICA and occlusive clot within the petrous segment of the right ICA. PROCEDURE: The Berenstein 2 catheter was then advanced over the wire into the right internal carotid artery. The walrus guide catheter was then advanced over the [facility] 2 catheter into the cervical right internal carotid artery. The Berenstein 2 and wire were removed. Manual aspiration performed until clear blood was seen. Large amount of clot retrieved. Right ICA angiograms were performed with frontal and lateral views of the upper neck and head. Recanalization of the cervical and intracranial right ICA noted as well as recanalization of the M1 segment. An occlusive clot within the distal right M2/MCA posterior division branch noted. Under biplane roadmap, a zoom 55 aspiration catheter was navigated over an Aristotle 14 microguidewire into the right M1/MCA. The aspiration catheter was then  advanced to the level of occlusion and connected to a penumbra aspiration pump. The guiding catheter balloon was inflated. The aspiration catheter was removed under constant aspiration. Follow-up right ICA angiograms with frontal and lateral views of the head showed complete recanalization of the right MCA territory. The guiding catheter was retracted into the right common carotid artery. Angioplasty at the right carotid bifurcation was performed with a 3 x 12 mm  trek balloon. Right common carotid artery angiograms with frontal and lateral views of the neck showed improved caliber at the right carotid bifurcation, occlusion of the ECA and nonocclusive filling defect concerning for clot. Aspiration through the guiding catheter was performed. However, follow-up right common carotid artery angiogram showed progression of clot formation and near occlusion of the proximal right ICA. Flat panel CT of the head was obtained and post processed in a separate workstation with concurrent attending physician supervision. Selected images were sent to PACS. No evidence of hemorrhagic complication seen. Patient was loaded on cangrelor. Subsequently, a 6-8 x 40 mm XACT carotid stent was deployed across the area of stenosis. Follow-up angiogram showed resolution of the stenosis with prompt fall or flow with no evidence of residual filling defect. Right internal carotid artery angiogram with frontal and lateral views of the head showed reocclusion of the right MCA at the proximal M1 segment. Under biplane roadmap, a zoom 71 aspiration catheter was navigated over an Aristotle 14 microguidewire into the right M1/MCA. The aspiration catheter was connected to a penumbra aspiration pump. The guiding catheter balloon was inflated. The aspiration catheter was removed under constant aspiration. Follow-up right ICA angiograms with frontal and lateral views of the head showed complete recanalization of the right MCA territory. Delay angiograms of the right common carotid artery showed patent recently deployed stent with no evidence of thromboembolic complication. Delayed angiograms of the head with frontal and lateral views showed no thromboembolic complication. Right common femoral artery angiograms were obtained with frontal and lateral views. The right common femoral artery has normal caliber. The femoral sheath was exchanged for an 8 [place] Angio-Seal which was utilized for access closure. Immediate  hemostasis was achieved. 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 PLAN: 1. Patient will be started on dual anti-platelet therapy with Brilinta and aspirin after follow-up CT confirms no evidence of hemorrhagic complication. 2. Carotid duplex within 3 months to evaluate for stent patency. Electronically Signed   By: Pedro Earls M.D.   On: 12/21/2020 16:56   IR US Guide Vasc Access Right  Result Date: 12/21/2020 INDICATION: 80 year old male with past medical history significant for aortic atherosclerosis, bilateral renal artery stenosis, chronic kidney disease, coronary artery, gastroesophageal reflux disease, hyperlipidemia, chronic orthostatic hypotension, and BPH. He initially presented to [facility] complaining of generalized weakness, tremors and vision changes. 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 During transferred to [facility] he was noted to have sudden change in his neurological exam with sudden onset of left-sided weakness. Repeat CT angiogram showed a new right MCA/M1 occlusion in addition to his cervical occlusion. Acute neuro changes noted at 17:40 on 12/19/2020, NIHSS 19 at presentation to [facility], modified Rankin scale 0. He was then transferred to our service for emergency cerebral angiogram and thrombectomy. EXAM: ULTRASOUND-GUIDED VASCULAR ACCESS DIAGNOSTIC CEREBRAL ANGIOGRAM MECHANICAL  THROMBECTOMY FLAT PANEL HEAD CT COMPARISON:  CT/CT angiogram of the head and neck December 19, 2020. MEDICATIONS: Cangrelor bolus and drip, IV. ANESTHESIA/SEDATION: The procedure was performed under general anesthesia. CONTRAST:  75 mL of Omnipaque 240 milligrams/mL. FLUOROSCOPY TIME:  Fluoroscopy  Time: 30 minutes 24 seconds (941.1 mGy). COMPLICATIONS: None immediate. TECHNIQUE: Informed written consent was obtained from the patient's spouse after a thorough discussion of the procedural risks, benefits and alternatives. All questions were addressed. Maximal Sterile Barrier Technique was utilized including caps, mask, sterile gowns, sterile gloves, sterile drape, hand hygiene and skin antiseptic. A timeout was performed prior to the initiation of the procedure. The right groin was prepped and draped in the usual sterile fashion. Using a micropuncture kit and the modified Seldinger technique, access was gained to the right common femoral artery and an 8 French sheath was placed. Real-time ultrasound guidance was utilized for vascular access including the acquisition of a permanent ultrasound image documenting patency of the accessed vessel. Under fluoroscopy, an 8 [place] Walrus balloon guide catheter was navigated over a 6 [place] Berenstein 2 catheter and a 0.035" Terumo Glidewire into the aortic arch. The catheter was placed into the right common carotid artery frontal and lateral angiograms of the neck were obtained. FINDINGS: Filling defect consistent with clot within the right common carotid artery, along the carotid bifurcation with contrast penetration into the cervical right ICA. Large filling defect within the bulb and proximal cervical right ICA and occlusive clot within the petrous segment of the right ICA. PROCEDURE: The Berenstein 2 catheter was then advanced over the wire into the right internal carotid artery. The walrus guide catheter was then advanced over the [facility] 2 catheter into the cervical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 cords and not ejected out;Material enters airway, passes BELOW cords without attempt by patient to eject out (silent aspiration) Pharyngeal- Thin Teaspoon -- Pharyngeal -- Pharyngeal- Thin Cup Penetration/Apiration after swallow;Penetration/Aspiration during swallow;Trace aspiration;Reduced epiglottic inversion;Reduced anterior laryngeal mobility;Reduced airway/laryngeal closure;Delayed swallow initiation-vallecula;Pharyngeal residue - valleculae;Pharyngeal residue - pyriform Pharyngeal Material enters airway, remains ABOVE vocal cords and not ejected out;Material enters airway, CONTACTS cords and not ejected out;Material enters airway, passes BELOW cords without attempt by patient to eject out (silent aspiration) Pharyngeal- Thin Straw -- Pharyngeal -- Pharyngeal- Puree -- Pharyngeal -- Pharyngeal- Mechanical Soft -- Pharyngeal -- Pharyngeal- Regular Reduced epiglottic inversion;Reduced anterior laryngeal mobility;Reduced airway/laryngeal closure;Delayed swallow initiation-vallecula;Pharyngeal residue - valleculae;Pharyngeal residue - pyriform Pharyngeal -- Pharyngeal- Multi-consistency -- Pharyngeal -- Pharyngeal- Pill Reduced epiglottic inversion;Reduced anterior laryngeal mobility;Reduced airway/laryngeal closure;Delayed swallow initiation-vallecula;Pharyngeal residue - valleculae;Pharyngeal residue - pyriform;Pharyngeal residue - cp segment Pharyngeal -- Pharyngeal Comment --  CHL IP  CERVICAL ESOPHAGEAL PHASE 12/28/2020 Cervical Esophageal Phase Impaired Pudding Teaspoon -- Pudding Cup -- Honey Teaspoon -- Honey Cup -- Nectar Teaspoon -- Nectar Cup -- Nectar Straw -- Thin Teaspoon -- Thin Cup -- Thin Straw -- Puree -- Mechanical Soft -- Regular -- Multi-consistency -- Pill Reduced cricopharyngeal relaxation Cervical Esophageal Comment -- Shanika I. Hardin Negus, [place], [place] Office number [phone] Pager [phone] Horton Marshall  12/28/2020, 2:21 PM              ECHOCARDIOGRAM COMPLETE  Result Date: 12/20/2020    ECHOCARDIOGRAM REPORT   Patient Name:   SAHAS SLUKA Date of Exam: 12/20/2020 Medical Rec #:  [id number]     Height:       73.0 in Accession #:    [id number]    Weight:       177.0 lb Date of Birth:  [birth year]     BSA:          2.043 m Patient Age:    10 years      BP:           133/75 mmHg Patient Gender: M             HR:           76 bpm. Exam Location:  Inpatient Procedure: 2D Echo Indications:    Stroke I63.9  History:        Patient has prior history of Echocardiogram examinations, most                 recent 07/13/2020. CAD, Aortic Valve Disease; Risk                 Factors:Dyslipidemia.  Sonographer:    Mikki Santee RDCS (AE) Referring Phys: [id number] [place]  1. Left ventricular ejection fraction, by estimation, is 50 to 55%. The left ventricle has low normal function. The left ventricle demonstrates regional wall motion abnormalities (see scoring diagram/findings for description). Left ventricular diastolic  parameters are indeterminate. There is mild hypokinesis of the left ventricular, apical inferior wall.  2. Right ventricular systolic function is moderately reduced. The right ventricular size is normal.  3. Left atrial size was mild to moderately dilated.  4. Right atrial size was mildly dilated.  5. The mitral valve is grossly normal. Mild mitral valve regurgitation.  6. Small Lambl's excrescence vs  calcification seen on ventricular aspect of aortic valve. The aortic valve is calcified. There is severe calcifcation of the aortic valve. There is severe thickening of the aortic valve. Aortic valve regurgitation is mild. Moderate aortic valve stenosis. Aortic valve mean gradient measures 21.0 mmHg. Comparison(s): No significant change from prior study. Conclusion(s)/Recommendation(s): No intracardiac source of embolism detected on this transthoracic study. A transesophageal echocardiogram is recommended to exclude cardiac source of embolism if clinically indicated. FINDINGS  Left Ventricle: Left ventricular ejection fraction, by estimation, is 50 to 55%. The left ventricle has low normal function. The left ventricle demonstrates regional wall motion abnormalities. Mild hypokinesis of the left ventricular, apical inferior wall. The left ventricular internal cavity size was normal in size. There is no left ventricular hypertrophy. Left ventricular diastolic parameters are indeterminate. Right Ventricle: The right ventricular size is normal. No increase in right ventricular wall thickness. Right ventricular systolic function is moderately reduced. Left Atrium: Left atrial size was mild to moderately dilated. Right Atrium: Right atrial size was mildly dilated. Pericardium: There is no evidence of pericardial effusion. Mitral Valve: The mitral valve is grossly normal. There is mild thickening of the mitral valve leaflet(s). There is mild calcification of the mitral valve leaflet(s). Mild to moderate mitral annular calcification. Mild mitral valve regurgitation. Tricuspid Valve: The tricuspid valve is normal in structure. Tricuspid valve regurgitation is trivial. Aortic Valve: Small Lambl's excrescence vs calcification seen on ventricular aspect of aortic valve. The aortic valve is calcified. There is severe calcifcation of the aortic valve. There  is severe thickening of the aortic valve. Aortic valve regurgitation is  mild. Moderate aortic stenosis is present. Aortic valve mean gradient measures 21.0 mmHg. Aortic valve peak gradient measures 31.8 mmHg. Aortic valve area, by VTI measures 1.42 cm. Pulmonic Valve: The pulmonic valve was grossly normal. Pulmonic valve regurgitation is mild. Aorta: The aortic root is normal in size and structure. Venous: The inferior vena cava was not well visualized. IAS/Shunts: The atrial septum is grossly normal.  LEFT VENTRICLE PLAX 2D LVIDd:         5.00 cm  Diastology LVIDs:         3.60 cm  LV e' medial:    5.00 cm/s LV PW:         1.10 cm  LV E/e' medial:  13.7 LV IVS:        1.10 cm  LV e' lateral:   9.57 cm/s LVOT diam:     2.20 cm  LV E/e' lateral: 7.1 LV SV:         79 LV SV Index:   39 LVOT Area:     3.80 cm  RIGHT VENTRICLE RV S prime:     7.72 cm/s TAPSE (M-mode): 1.3 cm LEFT ATRIUM             Index       RIGHT ATRIUM           Index LA diam:        3.90 cm 1.91 cm/m  RA Area:     13.50 cm LA Vol (A2C):   68.4 ml 33.48 ml/m RA Volume:   26.90 ml  13.17 ml/m LA Vol (A4C):   49.4 ml 24.18 ml/m LA Biplane Vol: 61.6 ml 30.16 ml/m  AORTIC VALVE AV Area (Vmax):    1.37 cm AV Area (Vmean):   1.27 cm AV Area (VTI):     1.42 cm AV Vmax:           282.00 cm/s AV Vmean:          220.000 cm/s AV VTI:            0.554 m AV Peak Grad:      31.8 mmHg AV Mean Grad:      21.0 mmHg LVOT Vmax:         102.00 cm/s LVOT Vmean:        73.700 cm/s LVOT VTI:          0.207 m LVOT/AV VTI ratio: 0.37  AORTA Ao Root diam: 3.30 cm MITRAL VALVE MV Area (PHT): 2.69 cm     SHUNTS MV Decel Time: 282 msec     Systemic VTI:  0.21 m MV E velocity: 68.30 cm/s   Systemic Diam: 2.20 cm MV A velocity: 103.00 cm/s MV E/A ratio:  0.66 Buford Dresser MD Electronically signed by Buford Dresser MD Signature Date/Time: 12/20/2020/7:49:25 PM    Final    IR PERCUTANEOUS ART THROMBECTOMY/INFUSION INTRACRANIAL INC DIAG ANGIO  Result Date: 12/21/2020 INDICATION: 80 year old male with past medical history  significant for aortic atherosclerosis, bilateral renal artery stenosis, chronic kidney disease, coronary artery, gastroesophageal reflux disease, hyperlipidemia, chronic orthostatic hypotension, and BPH. He initially presented to [facility] complaining of generalized weakness, tremors and vision changes. 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 During transferred to [facility] he was noted to have sudden change in his neurological exam with sudden onset of  left-sided weakness. Repeat CT angiogram showed a new right MCA/M1 occlusion in addition to his cervical occlusion. Acute neuro changes noted at 17:40 on 12/19/2020, NIHSS 19 at presentation to [facility], modified Rankin scale 0. He was then transferred to our service for emergency cerebral angiogram and thrombectomy. EXAM: ULTRASOUND-GUIDED VASCULAR ACCESS DIAGNOSTIC CEREBRAL ANGIOGRAM MECHANICAL THROMBECTOMY FLAT PANEL HEAD CT COMPARISON:  CT/CT angiogram of the head and neck December 19, 2020. MEDICATIONS: Cangrelor bolus and drip, IV. ANESTHESIA/SEDATION: The procedure was performed under general anesthesia. CONTRAST:  75 mL of Omnipaque 240 milligrams/mL. FLUOROSCOPY TIME:  Fluoroscopy Time: 30 minutes 24 seconds (941.1 mGy). COMPLICATIONS: None immediate. TECHNIQUE: Informed written consent was obtained from the patient's spouse after a thorough discussion of the procedural risks, benefits and alternatives. All questions were addressed. Maximal Sterile Barrier Technique was utilized including caps, mask, sterile gowns, sterile gloves, sterile drape, hand hygiene and skin antiseptic. A timeout was performed prior to the initiation of the procedure. The right groin was prepped and draped in the usual sterile fashion. Using a micropuncture kit and the modified Seldinger technique, access was gained to the right common femoral artery and an 8 French sheath  was placed. Real-time ultrasound guidance was utilized for vascular access including the acquisition of a permanent ultrasound image documenting patency of the accessed vessel. Under fluoroscopy, an 8 [place] Walrus balloon guide catheter was navigated over a 6 [place] Berenstein 2 catheter and a 0.035" Terumo Glidewire into the aortic arch. The catheter was placed into the right common carotid artery frontal and lateral angiograms of the neck were obtained. FINDINGS: Filling defect consistent with clot within the right common carotid artery, along the carotid bifurcation with contrast penetration into the cervical right ICA. Large filling defect within the bulb and proximal cervical right ICA and occlusive clot within the petrous segment of the right ICA. PROCEDURE: The Berenstein 2 catheter was then advanced over the wire into the right internal carotid artery. The walrus guide catheter was then advanced over the [facility] 2 catheter into the cervical right internal carotid artery. The Berenstein 2 and wire were removed. Manual aspiration performed until clear blood was seen. Large amount of clot retrieved. Right ICA angiograms were performed with frontal and lateral views of the upper neck and head. Recanalization of the cervical and intracranial right ICA noted as well as recanalization of the M1 segment. An occlusive clot within the distal right M2/MCA posterior division branch noted. Under biplane roadmap, a zoom 55 aspiration catheter was navigated over an Aristotle 14 microguidewire into the right M1/MCA. The aspiration catheter was then advanced to the level of occlusion and connected to a penumbra aspiration pump. The guiding catheter balloon was inflated. The aspiration catheter was removed under constant aspiration. Follow-up right ICA angiograms with frontal and lateral views of the head showed complete recanalization of the right MCA territory. The guiding catheter was retracted into the right common  carotid artery. Angioplasty at the right carotid bifurcation was performed with a 3 x 12 mm trek balloon. Right common carotid artery angiograms with frontal and lateral views of the neck showed improved caliber at the right carotid bifurcation, occlusion of the ECA and nonocclusive filling defect concerning for clot. Aspiration through the guiding catheter was performed. However, follow-up right common carotid artery angiogram showed progression of clot formation and near occlusion of the proximal right ICA. Flat panel CT of the head was obtained and post processed in a separate workstation with concurrent attending physician supervision. Selected images were sent  to PACS. No evidence of hemorrhagic complication seen. Patient was loaded on cangrelor. Subsequently, a 6-8 x 40 mm XACT carotid stent was deployed across the area of stenosis. Follow-up angiogram showed resolution of the stenosis with prompt fall or flow with no evidence of residual filling defect. Right internal carotid artery angiogram with frontal and lateral views of the head showed reocclusion of the right MCA at the proximal M1 segment. Under biplane roadmap, a zoom 71 aspiration catheter was navigated over an Aristotle 14 microguidewire into the right M1/MCA. The aspiration catheter was connected to a penumbra aspiration pump. The guiding catheter balloon was inflated. The aspiration catheter was removed under constant aspiration. Follow-up right ICA angiograms with frontal and lateral views of the head showed complete recanalization of the right MCA territory. Delay angiograms of the right common carotid artery showed patent recently deployed stent with no evidence of thromboembolic complication. Delayed angiograms of the head with frontal and lateral views showed no thromboembolic complication. Right common femoral artery angiograms were obtained with frontal and lateral views. The right common femoral artery has normal caliber. The femoral  sheath was exchanged for an 8 [place] Angio-Seal which was utilized for access closure. Immediate hemostasis was achieved. 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 PLAN: 1. Patient will be started on dual anti-platelet therapy with Brilinta and aspirin after follow-up CT confirms no evidence of hemorrhagic complication. 2. Carotid duplex within 3 months to evaluate for stent patency. Electronically Signed   By: Pedro Earls M.D.   On: 12/21/2020 16:56   CT HEAD CODE STROKE WO CONTRAST  Result Date: 01/06/2021 CLINICAL DATA:  Code stroke. Initial evaluation for acute left-sided weakness EXAM: CT HEAD WITHOUT CONTRAST TECHNIQUE: Contiguous axial images were obtained from the base of the skull through the vertex without intravenous contrast. COMPARISON:  Prior CT and MRI from 12/20/2020. FINDINGS: Brain: Cerebral volume within normal limits for age. No acute intracranial hemorrhage. There is a new 1 cm hypodensity at the right lentiform nucleus/putamen, consistent with a small ischemic infarct, acute to subacute in appearance. This is new as compared to previous MRI and CT from 12/20/2020. No other evidence for acute large vessel territory infarct. No mass lesion, midline shift or mass effect. No hydrocephalus or extra-axial fluid collection. 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 Vascular: No visible hyperdense vessel. Skull: Scalp soft tissues and calvarium within normal limits. Sinuses/Orbits: Globes and orbital soft tissues demonstrate no acute finding. Paranasal sinuses are largely clear. No mastoid effusion. Other: None. ASPECTS ([place] Stroke Program Early CT Score) - Ganglionic level infarction (caudate, lentiform nuclei, internal capsule, insula, M1-M3 cortex): 6 - Supraganglionic  infarction (M4-M6 cortex): 3 Total score (0-10 with 10 being normal): 9 IMPRESSION: 1. 1 cm hypodensity at the right putamen, somewhat age indeterminate, but could reflect an acute to subacute ischemic infarct. This is new as compared to recent MRI and CT from 12/20/2020. No intracranial hemorrhage. 2. ASPECTS is 9. Critical Value/emergent results were called by telephone at the time of interpretation on 01/06/2021 at 3:24 am to provider [facility] , who verbally acknowledged these results. Electronically Signed   By: Jeannine Boga M.D.   On: 01/06/2021 03:29   CT HEAD CODE STROKE WO CONTRAST  Result Date: 12/19/2020 CLINICAL DATA:  Code stroke.  Left-sided facial droop. EXAM: CT HEAD WITHOUT CONTRAST TECHNIQUE: Contiguous axial  images were obtained from the base of the skull through the vertex without intravenous contrast. COMPARISON:  Head CT, CTA, and MRI 12/19/2020 FINDINGS: Brain: No acute large territory infarct, intracranial hemorrhage, mass, midline shift, or extra-axial fluid collection is identified. The 2 subcentimeter acute right MCA cortical infarcts on MRI are not well shown by CT. There is mild cerebral atrophy. Hypodensities in the cerebral white matter bilaterally are unchanged and nonspecific but compatible with mild chronic small vessel ischemic disease. Small chronic infarcts are again noted in the right frontal lobe and left occipital lobe. Vascular: Limited assessment due to residual intravascular contrast from today's earlier CTA. Skull: No fracture or suspicious osseous lesion. Sinuses/Orbits: Small mucous retention cyst in the right frontal sinus. Small right mastoid effusion. Bilateral cataract extraction. Other: None. ASPECTS [facility] Stroke Program Early CT Score) - Ganglionic level infarction (caudate, lentiform nuclei, internal capsule, insula, M1-M3 cortex): 7 - Supraganglionic infarction (M4-M6 cortex): 3 Total score (0-10 with 10 being normal): 10 IMPRESSION: 1. No  evidence of acute large territory infarct or intracranial hemorrhage. Subcentimeter acute right MCA infarcts on MRI are occult by CT. 2. ASPECTS is 10. These results were communicated to Dr. Lorrin Goodell at 6:24 pm on 12/19/2020 by text page via the [facility] messaging system. Electronically Signed   By: Logan Bores M.D.   On: 12/19/2020 18:24   CT ANGIO HEAD CODE STROKE  Result Date: 12/19/2020 CLINICAL DATA:  Left facial droop. EXAM: CT ANGIOGRAPHY HEAD AND NECK TECHNIQUE: Multidetector CT imaging of the head and neck was performed using the standard protocol during bolus administration of intravenous contrast. Multiplanar CT image reconstructions and MIPs were obtained to evaluate the vascular anatomy. Carotid stenosis measurements (when applicable) are obtained utilizing NASCET criteria, using the distal internal carotid diameter as the denominator. CONTRAST:  57mL OMNIPAQUE IOHEXOL 350 MG/ML SOLN COMPARISON:  CTA head and neck earlier today FINDINGS: CTA NECK FINDINGS Aortic arch: Standard 3 vessel aortic arch with mild-to-moderate calcified plaque. No evidence of significant arch vessel origin stenosis. 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 Left carotid system: Patent with mild calcified plaque in the mid common carotid artery and at the carotid bifurcation. No evidence of significant stenosis or dissection. Vertebral arteries: Calcified plaque results in unchanged mild stenosis of the proximal right subclavian artery. The vertebral arteries remain patent with unchanged mild stenosis of both origins due to calcified plaque. The left vertebral artery is mildly dominant. Skeleton: Moderate cervical disc and facet degeneration. Other neck: No evidence of cervical lymphadenopathy or mass. Post radiation changes in the neck. Upper chest: Scarring or post radiation fibrosis in the lung apices.  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 There is an early bifurcation of the right MCA with a new filling defect at the bifurcation consistent with an embolus. The origin of the right M2 superior division is narrowed but remains patent. The right M2 inferior division is occluded over approximately 6 mm long segment beginning at the MCA bifurcation with subsequent reconstitution. There is also occlusion of some small right MCA branches more distally compared to today's earlier CTA. The left MCA and both ACAs remain patent with branch vessel irregularity but no evidence of a significant proximal stenosis. No aneurysm is identified. Posterior circulation: The intracranial vertebral  arteries are patent to the basilar with atherosclerosis resulting in mild luminal irregularity and at most mild narrowing bilaterally. Patent PICA and SCA origins are identified bilaterally. The basilar artery is widely patent. There is a small right posterior communicating artery. Both PCAs are patent with mild atherosclerotic irregularity but no evidence of a significant proximal stenosis. No aneurysm is identified. Venous sinuses: Patent. Anatomic variants: None. Review of the MIP images confirms the above findings IMPRESSION: 1. New embolus at the right MCA bifurcation with short segment occlusion of the M2 inferior division, narrowing of the origin of the M2 superior division, and occlusion of some more distal MCA branch vessels. 2. 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 3. Mild bilateral vertebral artery origin stenoses. 4. Left-sided carotid atherosclerosis without significant stenosis. 5.  Aortic Atherosclerosis (ICD10-I70.0). These results were communicated to Dr.  Lorrin Goodell at 6:24 pm on 12/19/2020 by text page via the [facility] messaging system. Electronically Signed   By: Logan Bores M.D.   On: 12/19/2020 18:38   CT ANGIO NECK CODE STROKE  Result Date: 12/19/2020 CLINICAL DATA:  Left facial droop. EXAM: CT ANGIOGRAPHY HEAD AND NECK TECHNIQUE: Multidetector CT imaging of the head and neck was performed using the standard protocol during bolus administration of intravenous contrast. Multiplanar CT image reconstructions and MIPs were obtained to evaluate the vascular anatomy. Carotid stenosis measurements (when applicable) are obtained utilizing NASCET criteria, using the distal internal carotid diameter as the denominator. CONTRAST:  63mL OMNIPAQUE IOHEXOL 350 MG/ML SOLN COMPARISON:  CTA head and neck earlier today FINDINGS: CTA NECK FINDINGS Aortic arch: Standard 3 vessel aortic arch with mild-to-moderate calcified plaque. No evidence of significant arch vessel origin stenosis. 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 Left carotid system: Patent with mild calcified plaque in the mid common carotid artery and at the carotid bifurcation. No evidence of significant stenosis or dissection. Vertebral arteries: Calcified plaque results in unchanged mild stenosis of the proximal right subclavian artery. The vertebral arteries remain patent with unchanged mild stenosis of both origins due to calcified plaque. The left vertebral artery is mildly dominant. Skeleton: Moderate cervical disc and facet degeneration. Other neck: No evidence of cervical lymphadenopathy or mass. Post radiation changes in the neck. Upper chest: Scarring or post radiation fibrosis in the lung apices. Review of the MIP images confirms the above findings CTA HEAD FINDINGS Anterior circulation: The intracranial right ICA is occluded proximally with reconstitution of the ophthalmic segment  through the terminus. There is an early bifurcation of the right MCA with a new filling defect at the bifurcation consistent with an embolus. The origin of the right M2 superior division is narrowed but remains patent. The right M2 inferior division is occluded over approximately 6 mm long segment beginning at the MCA bifurcation with subsequent reconstitution. There is also occlusion of some small right MCA branches more distally compared to today's earlier CTA. The left MCA and both ACAs remain patent with branch vessel irregularity but no evidence of a significant proximal stenosis. No aneurysm is identified. Posterior circulation: The intracranial vertebral arteries are patent to the basilar with atherosclerosis resulting in mild luminal irregularity and at most mild narrowing bilaterally. Patent PICA and SCA origins are identified bilaterally. The basilar artery is widely patent. There is a small right posterior communicating artery. Both PCAs  are patent with mild atherosclerotic irregularity but no evidence of a significant proximal stenosis. No aneurysm is identified. Venous sinuses: Patent. Anatomic variants: None. Review of the MIP images confirms the above findings IMPRESSION: 1. New embolus at the right MCA bifurcation with short segment occlusion of the M2 inferior division, narrowing of the origin of the M2 superior division, and occlusion of some more distal MCA branch vessels. 2. 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 3. Mild bilateral vertebral artery origin stenoses. 4. Left-sided carotid atherosclerosis without significant stenosis. 5.  Aortic Atherosclerosis (ICD10-I70.0). These results were communicated to Dr. Lorrin Goodell at 6:24 pm on 12/19/2020 by text page via the [facility] messaging system. Electronically Signed   By: Logan Bores M.D.   On: 12/19/2020 18:38    Assessment: 80 y.o. 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 Symptoms improved with elevation of BP, and currently at baseline. No tPA given due to recent stroke and symptoms resolved.   Of note, patient had a stroke on 12/19/20 with MRI showing 2 punctate right MCA infarcts.  CTA head and neck showed right CCA and ICA occlusion but MCA patent. Then developed left arm weakness and left facial droop. Repeat CT head and neck showed new right MCA bifurcation embolus. S/p thrombectomy with TICI3 reperfusion and Right ICA stenting.  MRI repeat showed a few more punctate infarct at right MCA territory.  EF 50 to 55%.  A1c 6.3, LDL 30.  Patient recovered well,  discharged on aspirin Brilinta and Crestor to outpatient PT/OT.  Current episode of left sided weakness, AMS likely again due to orthostatic hypotension, with lying down and BP elevation, pt now back to baseline. CTA showed patent stent and no LVO in the brain. Will recommend orthostatic vital check, MRI brain to rule out stroke, continue aspirin Brilinta DAPT and statin.  Plan: - MRI brain without contrast - PT consult, OT consult, Speech consult - Check orthostatic vitals - Continue aspirin and Brilinta DAPT for recent right ICA stenting - Continue statin - continue Abx and treatment of leukocytosis and elevated creatinine per primary team - Risk factor modification - will follow  Thank you for this consultation and allowing [place] to participate in the care of this patient.  Rosalin Hawking, MD PhD Stroke Neurology 01/06/2021 2:26 PM

## 2021-01-06 NOTE — Plan of Care (Signed)

## 2021-01-06 NOTE — Progress Notes (Signed)
PROGRESS NOTE    George Bradley  FTD:[id number] DOB: 01-May-1941 DOA: 01/06/2021 PCP: [name], MD      Brief Narrative:  George Bradley is a 80 y.o. M with moderate dementia, ambulatory and lives at home, history tongue cancer, CAD, chronic orthostatic hypotension, AS, and recent right MCA stroke with recent ICA stent and then recent admission after that for pneumonia sepsis who presetned with acute episode of unresponsiveness.  Patient was home for about 1 week this week.  The night of admission, he had an episode of "clammy" and diaphoretic, and weak then unresponsive, after which he groggy and when EMS arrived his BP was <51mmHg.    In the ER, vitals had returned to normal and patient was closer to his baseline cognition.  CT head and CTA head and neck showed possible stroke.  Transferred to [facility] for Neurology evaluation.          Assessment & Plan:  Acute RIGHT basal ganglia ischemic stroke -Non-invasive angiography showed recent revascularization with some mild stenosis in the stent, otherwise known atherosclerosis only. -Echocardiogram very recently completed, not repeated -Lipids not ordered since recently done: continued Crestor -Aspirin ordered at admission --> conitnue apsirin and Ticagrelor -Hold home amlodipine    Dementia with behavioral disturbance 3 days prior to admission, patient had an episode where he was very agitated and disturbed, argued incoherently with his wife and left the house.  She tried to get him to return but he was physically aggressive.  She ultimately was able to convince him to take an Ambien, and he got sleepy and went to bed.  However, his advancing dementia combined with his chronic lorazepam use combined with his recent delirium (wife describes hospital delirium in both recent hospital stays) make him high risk for more erratic behaviors like this.   -He would benefit from [facility] eval and weaning Ativan.  -Continue  Aricept   Anxiety -Continue lorazepam, taper if able as outpatient   Orthostatic hypotension Chronic.  Here he is orthostatic but asymptomatic with it. -Start abdominal binder and compression hose -Follow up with Cardiology Dr. Martinique in 1 month as planned -Continue chronic Florinef  Pneumonia, resolving -Continue Augmentin  Coronary disease -Continue aspirin, Crestor, Repatha  Hypothyroidism -Continue levoithyroxine  Neuropathy -Continue gabapentin  History oral cancer -Resume pilocarpine at discharge           Disposition: Status is: Observation  The patient remains OBS appropriate and will d/c before 2 midnights.  Dispo: The patient is from: Home              Anticipated d/c is to: Home              Patient currently is not medically stable to d/c.   Difficult to place patient No              MDM: This is a no charge note.  For further details, please see H&P by my partner Dr. Ilda Mori from earlier today.  The below labs and imaging reports were reviewed and summarized above.    DVT prophylaxis: heparin injection 5,000 Units Start: 01/06/21 1400 SCD's Start: 01/06/21 0623  Code Status: FULL Family Communication: wife           Subjective: Feels at baseline.        Objective: Vitals:   01/06/21 0800 01/06/21 0938 01/06/21 0942 01/06/21 1142  BP: 101/67  129/78 (!) 158/90  Pulse: 76  70 74  Resp: 18  18 13  Temp:   98.7 F (37.1 C) 98.4 F (36.9 C)  TempSrc:   Oral Oral  SpO2: 98% 96% 96% 94%  Height:        Intake/Output Summary (Last 24 hours) at 01/06/2021 1608 Last data filed at 01/06/2021 1200 Gross per 24 hour  Intake --  Output 1000 ml  Net -1000 ml   There were no vitals filed for this visit.  Examination: The patient was seen and examined.      Data Reviewed: I have personally reviewed following labs and imaging studies:  CBC: Recent Labs  Lab 01/06/21 0324  WBC 14.5*  NEUTROABS 12.6*   HGB 13.0  HCT 40.5  MCV 101.3*  PLT 956   Basic Metabolic Panel: Recent Labs  Lab 01/06/21 0324  NA 139  K 4.3  CL 104  CO2 26  GLUCOSE 168*  BUN 24*  CREATININE 1.35*  CALCIUM 8.9   GFR: Estimated Creatinine Clearance: 48.1 mL/min (A) (by C-G formula based on SCr of 1.35 mg/dL (H)). Liver Function Tests: Recent Labs  Lab 01/06/21 0324  AST 34  ALT 35  ALKPHOS 37*  BILITOT 0.3  PROT 7.6  ALBUMIN 3.7   No results for input(s): LIPASE, AMYLASE in the last 168 hours. No results for input(s): AMMONIA in the last 168 hours. Coagulation Profile: Recent Labs  Lab 01/06/21 0324  INR 1.0   Cardiac Enzymes: No results for input(s): CKTOTAL, CKMB, CKMBINDEX, TROPONINI in the last 168 hours. BNP (last 3 results) No results for input(s): PROBNP in the last 8760 hours. HbA1C: No results for input(s): HGBA1C in the last 72 hours. CBG: Recent Labs  Lab 01/06/21 0807  GLUCAP 97   Lipid Profile: No results for input(s): CHOL, HDL, LDLCALC, TRIG, CHOLHDL, LDLDIRECT in the last 72 hours. Thyroid Function Tests: No results for input(s): TSH, T4TOTAL, FREET4, T3FREE, THYROIDAB in the last 72 hours. Anemia Panel: No results for input(s): VITAMINB12, FOLATE, FERRITIN, TIBC, IRON, RETICCTPCT in the last 72 hours. Urine analysis:    Component Value Date/Time   COLORURINE STRAW (A) 01/06/2021 0515   APPEARANCEUR CLEAR 01/06/2021 0515   LABSPEC 1.028 01/06/2021 0515   LABSPEC 1.005 06/25/2011 1502   PHURINE 7.0 01/06/2021 0515   GLUCOSEU NEGATIVE 01/06/2021 0515   HGBUR NEGATIVE 01/06/2021 0515   BILIRUBINUR NEGATIVE 01/06/2021 0515   BILIRUBINUR Negative 06/25/2011 1502   KETONESUR NEGATIVE 01/06/2021 0515   PROTEINUR NEGATIVE 01/06/2021 0515   UROBILINOGEN 0.2 10/09/2011 1634   NITRITE NEGATIVE 01/06/2021 0515   LEUKOCYTESUR NEGATIVE 01/06/2021 0515   LEUKOCYTESUR Negative 06/25/2011 1502   Sepsis Labs: @LABRCNTIP (procalcitonin:4,lacticacidven:4)  ) Recent  Results (from the past 240 hour(s))  MRSA PCR Screening     Status: None   Collection Time: 12/28/20 10:03 AM   Specimen: Nasopharyngeal  Result Value Ref Range Status   MRSA by PCR NEGATIVE NEGATIVE Final    Comment:        The GeneXpert MRSA Assay (FDA approved for NASAL specimens only), is one component of a comprehensive MRSA colonization surveillance program. It is not intended to diagnose MRSA infection nor to guide or monitor treatment for MRSA infections. Performed at [facility], [place] [address], [place], [place] [address]   Resp Panel by RT-PCR (Flu A&B, Covid) Nasopharyngeal Swab     Status: None   Collection Time: 01/06/21  3:03 AM   Specimen: Nasopharyngeal Swab; Nasopharyngeal(NP) swabs in vial transport medium  Result Value Ref Range Status   SARS Coronavirus 2 by  RT PCR NEGATIVE NEGATIVE Final    Comment: (NOTE) SARS-CoV-2 target nucleic acids are NOT DETECTED.  The SARS-CoV-2 RNA is generally detectable in upper respiratory specimens during the acute phase of infection. The lowest concentration of SARS-CoV-2 viral copies this assay can detect is 138 copies/mL. A negative result does not preclude SARS-Cov-2 infection and should not be used as the sole basis for treatment or other patient management decisions. A negative result may occur with  improper specimen collection/handling, submission of specimen other than nasopharyngeal swab, presence of viral mutation(s) within the areas targeted by this assay, and inadequate number of viral copies(<138 copies/mL). A negative result must be combined with clinical observations, patient history, and epidemiological information. The expected result is Negative.  Fact Sheet for Patients:  EntrepreneurPulse.com.au  Fact Sheet for Healthcare Providers:  IncredibleEmployment.be  This test is no t yet approved or cleared by the [place] FDA and  has been authorized for  detection and/or diagnosis of SARS-CoV-2 by FDA under an Emergency Use Authorization (EUA). This EUA will remain  in effect (meaning this test can be used) for the duration of the COVID-19 declaration under Section 564(b)(1) of the Act, 21 U.S.C.section 360bbb-3(b)(1), unless the authorization is terminated  or revoked sooner.       Influenza A by PCR NEGATIVE NEGATIVE Final   Influenza B by PCR NEGATIVE NEGATIVE Final    Comment: (NOTE) The Xpert Xpress SARS-CoV-2/FLU/RSV plus assay is intended as an aid in the diagnosis of influenza from Nasopharyngeal swab specimens and should not be used as a sole basis for treatment. Nasal washings and aspirates are unacceptable for Xpert Xpress SARS-CoV-2/FLU/RSV testing.  Fact Sheet for Patients: EntrepreneurPulse.com.au  Fact Sheet for Healthcare Providers: IncredibleEmployment.be  This test is not yet approved or cleared by the [place] FDA and has been authorized for detection and/or diagnosis of SARS-CoV-2 by FDA under an Emergency Use Authorization (EUA). This EUA will remain in effect (meaning this test can be used) for the duration of the COVID-19 declaration under Section 564(b)(1) of the Act, 21 U.S.C. section 360bbb-3(b)(1), unless the authorization is terminated or revoked.  Performed at [facility], [address], [place], [place] [address]          Radiology Studies: CT Angio Head W or Wo Contrast  Result Date: 01/06/2021 CLINICAL DATA:  Initial evaluation for acute stroke, left-sided weakness, now resolved. EXAM: CT ANGIOGRAPHY HEAD AND NECK CT PERFUSION BRAIN TECHNIQUE: Multidetector CT imaging of the head and neck was performed using the standard protocol during bolus administration of intravenous contrast. Multiplanar CT image reconstructions and MIPs were obtained to evaluate the vascular anatomy. Carotid stenosis measurements (when applicable) are obtained utilizing NASCET  criteria, using the distal internal carotid diameter as the denominator. Multiphase CT imaging of the brain was performed following IV bolus contrast injection. Subsequent parametric perfusion maps were calculated using RAPID software. CONTRAST:  172mL OMNIPAQUE IOHEXOL 350 MG/ML SOLN COMPARISON:  Prior CT from earlier the same day as well as previous studies from 12/20/2020 and 12/19/2020. FINDINGS: CTA NECK FINDINGS Aortic arch: Visualized aortic arch normal in caliber with normal 3 vessel morphology. Moderate atheromatous change about the arch and origin of the great vessels without high-grade stenosis. Right carotid system: Right CCA is now widely patent from its origin to the bifurcation. Intravascular stent traverses the right bifurcation extending into the proximal right ICA. Moderate stenosis of the stent at the level of the native bifurcation of up to approximately 50%. Stent is otherwise  widely patent without intraluminal thrombus or other complication. Right ICA patent distally to the skull base without stenosis, dissection or occlusion. Left carotid system: Left CCA tortuous proximally but is widely patent to the bifurcation without stenosis. Mild calcified plaque about the left bifurcation/proximal left ICA without significant stenosis. Left ICA patent distally to the skull base without stenosis, dissection or occlusion. Vertebral arteries: Both vertebral arteries arise from the subclavian arteries. Calcified plaque at the origin of the right subclavian artery with associated mild stenosis again noted. Atheromatous plaque at the origins of both vertebral arteries M cells with associated mild stenosis noted as well, also stable. Left vertebral artery slightly dominant. Vertebral arteries otherwise remain widely patent within the neck without stenosis, dissection or occlusion. Skeleton: No acute osseous finding. No discrete or worrisome osseous lesions. Mild multilevel degenerative spondylosis and facet  arthrosis without high-grade stenosis. Other neck: No other acute soft tissue abnormality within the neck. No mass or adenopathy. Post radiation changes again noted. Upper chest: Post radiation scarring noted at the upper lungs bilaterally. Visualized upper chest demonstrates no other acute finding. Review of the MIP images confirms the above findings CTA HEAD FINDINGS Anterior circulation: Petrous segments are now both widely patent. Atheromatous plaque throughout the carotid siphons with no more than mild stenosis at the para clinoid segments. A1 segments widely patent. Normal anterior communicating artery complex. Anterior cerebral arteries remain widely patent within the neck. No M1 stenosis or occlusion. Right M1 bifurcates early. No proximal MCA branch occlusion. Distal MCA branches well perfused and symmetric. Posterior circulation: Both V4 segments remain patent to the vertebrobasilar junction. Both PICA origins are patent and normal. Basilar widely patent to its distal aspect. Superior cerebellar arteries patent bilaterally. Both PCAs are primarily supplied via the basilar. Mild atheromatous irregularity within the PCAs bilaterally without significant stenosis. Venous sinuses: Grossly patent allowing for timing the contrast bolus. Anatomic variants: None significant.  No aneurysm. Review of the MIP images confirms the above findings CT Brain Perfusion Findings: ASPECTS: 9. IMPRESSION: CTA HEAD AND NECK IMPRESSION: 1. Negative CTA for emergent large vessel occlusion. 2. Interval revascularization of the right common and internal carotid arteries since previous exam, with vascular stent in place across the right carotid bifurcation. Focal narrowing of the stent by approximately 50% at the level of the native bifurcation. Otherwise, widely patent flow seen through the stent. 3. 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 4. Mild bilateral vertebral artery origins stenoses, also  stable. 5.  Aortic Atherosclerosis (ICD10-I70.0). CT PERFUSION IMPRESSION: The CT perfusion portion of this exam unfortunately failed, with no perfusion maps generated. Results were called by telephone at the time of interpretation on 01/06/2021 at 4:00 am to provider [facility] , who verbally acknowledged these results. Electronically Signed   By: Jeannine Boga M.D.   On: 01/06/2021 04:45   CT Angio Neck W and/or Wo Contrast  Result Date: 01/06/2021 CLINICAL DATA:  Initial evaluation for acute stroke, left-sided weakness, now resolved. EXAM: CT ANGIOGRAPHY HEAD AND NECK CT PERFUSION BRAIN TECHNIQUE: Multidetector CT imaging of the head and neck was performed using the standard protocol during bolus administration of intravenous contrast. Multiplanar CT image reconstructions and MIPs were obtained to evaluate the vascular anatomy. Carotid stenosis measurements (when applicable) are obtained utilizing NASCET criteria, using the distal internal carotid diameter as the denominator. Multiphase CT imaging of the brain was performed following IV bolus contrast injection. Subsequent parametric perfusion maps were calculated using RAPID software. CONTRAST:  141mL OMNIPAQUE IOHEXOL 350  MG/ML SOLN COMPARISON:  Prior CT from earlier the same day as well as previous studies from 12/20/2020 and 12/19/2020. FINDINGS: CTA NECK FINDINGS Aortic arch: Visualized aortic arch normal in caliber with normal 3 vessel morphology. Moderate atheromatous change about the arch and origin of the great vessels without high-grade stenosis. Right carotid system: Right CCA is now widely patent from its origin to the bifurcation. Intravascular stent traverses the right bifurcation extending into the proximal right ICA. Moderate stenosis of the stent at the level of the native bifurcation of up to approximately 50%. Stent is otherwise widely patent without intraluminal thrombus or other complication. Right ICA patent distally to the  skull base without stenosis, dissection or occlusion. Left carotid system: Left CCA tortuous proximally but is widely patent to the bifurcation without stenosis. Mild calcified plaque about the left bifurcation/proximal left ICA without significant stenosis. Left ICA patent distally to the skull base without stenosis, dissection or occlusion. Vertebral arteries: Both vertebral arteries arise from the subclavian arteries. Calcified plaque at the origin of the right subclavian artery with associated mild stenosis again noted. Atheromatous plaque at the origins of both vertebral arteries M cells with associated mild stenosis noted as well, also stable. Left vertebral artery slightly dominant. Vertebral arteries otherwise remain widely patent within the neck without stenosis, dissection or occlusion. Skeleton: No acute osseous finding. No discrete or worrisome osseous lesions. Mild multilevel degenerative spondylosis and facet arthrosis without high-grade stenosis. Other neck: No other acute soft tissue abnormality within the neck. No mass or adenopathy. Post radiation changes again noted. Upper chest: Post radiation scarring noted at the upper lungs bilaterally. Visualized upper chest demonstrates no other acute finding. Review of the MIP images confirms the above findings CTA HEAD FINDINGS Anterior circulation: Petrous segments are now both widely patent. Atheromatous plaque throughout the carotid siphons with no more than mild stenosis at the para clinoid segments. A1 segments widely patent. Normal anterior communicating artery complex. Anterior cerebral arteries remain widely patent within the neck. No M1 stenosis or occlusion. Right M1 bifurcates early. No proximal MCA branch occlusion. Distal MCA branches well perfused and symmetric. Posterior circulation: Both V4 segments remain patent to the vertebrobasilar junction. Both PICA origins are patent and normal. Basilar widely patent to its distal aspect. Superior  cerebellar arteries patent bilaterally. Both PCAs are primarily supplied via the basilar. Mild atheromatous irregularity within the PCAs bilaterally without significant stenosis. Venous sinuses: Grossly patent allowing for timing the contrast bolus. Anatomic variants: None significant.  No aneurysm. Review of the MIP images confirms the above findings CT Brain Perfusion Findings: ASPECTS: 9. IMPRESSION: CTA HEAD AND NECK IMPRESSION: 1. Negative CTA for emergent large vessel occlusion. 2. Interval revascularization of the right common and internal carotid arteries since previous exam, with vascular stent in place across the right carotid bifurcation. Focal narrowing of the stent by approximately 50% at the level of the native bifurcation. Otherwise, widely patent flow seen through the stent. 3. 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 4. Mild bilateral vertebral artery origins stenoses, also stable. 5.  Aortic Atherosclerosis (ICD10-I70.0). CT PERFUSION IMPRESSION: The CT perfusion portion of this exam unfortunately failed, with no perfusion maps generated. Results were called by telephone at the time of interpretation on 01/06/2021 at 4:00 am to provider [facility] , who verbally acknowledged these results. Electronically Signed   By: Jeannine Boga M.D.   On: 01/06/2021 04:45   MR BRAIN WO CONTRAST  Result Date: 01/06/2021 CLINICAL DATA:  Neuro deficit, acute,  stroke suspected. EXAM: MRI HEAD WITHOUT CONTRAST TECHNIQUE: Multiplanar, multiecho pulse sequences of the brain and surrounding structures were obtained without intravenous contrast. COMPARISON:  Noncontrast head CT and CT angiogram head/neck as well as CT perfusion performed 01/06/2021. brain MRI 02/19/2021. FINDINGS: Brain: Mild intermittent motion degradation. Mild cerebral and cerebellar atrophy. There is a 14 mm focus of restricted diffusion within the right internal capsule/lentiform nucleus compatible with  acute infarction. Redemonstrated small chronic cortical infarct within the left occipital lobe (PCA vascular territory). Background mild multifocal T2/FLAIR hyperintensity within the cerebral white matter is nonspecific, but compatible with chronic small vessel ischemic disease. Redemonstrated small chronic infarct within the left cerebellar hemisphere. No evidence of intracranial mass. No chronic intracranial blood products. No extra-axial fluid collection. No midline shift. Vascular: Expected proximal arterial flow voids. Skull and upper cervical spine: No focal marrow lesion. Sinuses/Orbits: Visualized orbits show no acute finding. Trace bilateral ethmoid sinus mucosal thickening. Other: Right mastoid effusion. IMPRESSION: 14 mm acute infarct within the right internal capsule/lentiform nucleus. Redemonstrated small chronic cortical infarct within the left occipital lobe. Stable background mild generalized parenchymal atrophy and mild cerebral white matter chronic small vessel ischemic disease. Redemonstrated small chronic infarct within the left cerebellar hemisphere. Mild bilateral ethmoid sinus mucosal thickening. Right mastoid effusion. Electronically Signed   By: Kellie Simmering DO   On: 01/06/2021 14:16   CT CEREBRAL PERFUSION W CONTRAST  Result Date: 01/06/2021 CLINICAL DATA:  Initial evaluation for acute stroke, left-sided weakness, now resolved. EXAM: CT ANGIOGRAPHY HEAD AND NECK CT PERFUSION BRAIN TECHNIQUE: Multidetector CT imaging of the head and neck was performed using the standard protocol during bolus administration of intravenous contrast. Multiplanar CT image reconstructions and MIPs were obtained to evaluate the vascular anatomy. Carotid stenosis measurements (when applicable) are obtained utilizing NASCET criteria, using the distal internal carotid diameter as the denominator. Multiphase CT imaging of the brain was performed following IV bolus contrast injection. Subsequent parametric  perfusion maps were calculated using RAPID software. CONTRAST:  142mL OMNIPAQUE IOHEXOL 350 MG/ML SOLN COMPARISON:  Prior CT from earlier the same day as well as previous studies from 12/20/2020 and 12/19/2020. FINDINGS: CTA NECK FINDINGS Aortic arch: Visualized aortic arch normal in caliber with normal 3 vessel morphology. Moderate atheromatous change about the arch and origin of the great vessels without high-grade stenosis. Right carotid system: Right CCA is now widely patent from its origin to the bifurcation. Intravascular stent traverses the right bifurcation extending into the proximal right ICA. Moderate stenosis of the stent at the level of the native bifurcation of up to approximately 50%. Stent is otherwise widely patent without intraluminal thrombus or other complication. Right ICA patent distally to the skull base without stenosis, dissection or occlusion. Left carotid system: Left CCA tortuous proximally but is widely patent to the bifurcation without stenosis. Mild calcified plaque about the left bifurcation/proximal left ICA without significant stenosis. Left ICA patent distally to the skull base without stenosis, dissection or occlusion. Vertebral arteries: Both vertebral arteries arise from the subclavian arteries. Calcified plaque at the origin of the right subclavian artery with associated mild stenosis again noted. Atheromatous plaque at the origins of both vertebral arteries M cells with associated mild stenosis noted as well, also stable. Left vertebral artery slightly dominant. Vertebral arteries otherwise remain widely patent within the neck without stenosis, dissection or occlusion. Skeleton: No acute osseous finding. No discrete or worrisome osseous lesions. Mild multilevel degenerative spondylosis and facet arthrosis without high-grade stenosis. Other neck: No other acute soft  tissue abnormality within the neck. No mass or adenopathy. Post radiation changes again noted. Upper chest: Post  radiation scarring noted at the upper lungs bilaterally. Visualized upper chest demonstrates no other acute finding. Review of the MIP images confirms the above findings CTA HEAD FINDINGS Anterior circulation: Petrous segments are now both widely patent. Atheromatous plaque throughout the carotid siphons with no more than mild stenosis at the para clinoid segments. A1 segments widely patent. Normal anterior communicating artery complex. Anterior cerebral arteries remain widely patent within the neck. No M1 stenosis or occlusion. Right M1 bifurcates early. No proximal MCA branch occlusion. Distal MCA branches well perfused and symmetric. Posterior circulation: Both V4 segments remain patent to the vertebrobasilar junction. Both PICA origins are patent and normal. Basilar widely patent to its distal aspect. Superior cerebellar arteries patent bilaterally. Both PCAs are primarily supplied via the basilar. Mild atheromatous irregularity within the PCAs bilaterally without significant stenosis. Venous sinuses: Grossly patent allowing for timing the contrast bolus. Anatomic variants: None significant.  No aneurysm. Review of the MIP images confirms the above findings CT Brain Perfusion Findings: ASPECTS: 9. IMPRESSION: CTA HEAD AND NECK IMPRESSION: 1. Negative CTA for emergent large vessel occlusion. 2. Interval revascularization of the right common and internal carotid arteries since previous exam, with vascular stent in place across the right carotid bifurcation. Focal narrowing of the stent by approximately 50% at the level of the native bifurcation. Otherwise, widely patent flow seen through the stent. 3. 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 4. Mild bilateral vertebral artery origins stenoses, also stable. 5.  Aortic Atherosclerosis (ICD10-I70.0). CT PERFUSION IMPRESSION: The CT perfusion portion of this exam unfortunately failed, with no perfusion maps generated. Results were  called by telephone at the time of interpretation on 01/06/2021 at 4:00 am to provider [facility] , who verbally acknowledged these results. Electronically Signed   By: Jeannine Boga M.D.   On: 01/06/2021 04:45   DG Chest Portable 1 View  Result Date: 01/06/2021 CLINICAL DATA:  Altered mental status EXAM: PORTABLE CHEST 1 VIEW COMPARISON:  12/29/2020 FINDINGS: Heart is normal size. Aortic atherosclerosis. No confluent opacities or effusions. No acute bony abnormality area IMPRESSION: No active disease. Electronically Signed   By: Rolm Baptise M.D.   On: 01/06/2021 03:52   CT HEAD CODE STROKE WO CONTRAST  Result Date: 01/06/2021 CLINICAL DATA:  Code stroke. Initial evaluation for acute left-sided weakness EXAM: CT HEAD WITHOUT CONTRAST TECHNIQUE: Contiguous axial images were obtained from the base of the skull through the vertex without intravenous contrast. COMPARISON:  Prior CT and MRI from 12/20/2020. FINDINGS: Brain: Cerebral volume within normal limits for age. No acute intracranial hemorrhage. There is a new 1 cm hypodensity at the right lentiform nucleus/putamen, consistent with a small ischemic infarct, acute to subacute in appearance. This is new as compared to previous MRI and CT from 12/20/2020. No other evidence for acute large vessel territory infarct. No mass lesion, midline shift or mass effect. No hydrocephalus or extra-axial fluid collection. 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 Vascular: No visible hyperdense vessel. Skull: Scalp soft tissues and calvarium within normal limits. Sinuses/Orbits: Globes and orbital soft tissues demonstrate no acute finding. Paranasal sinuses are largely clear. No mastoid effusion. Other: None. ASPECTS [facility] Stroke Program Early CT Score) - Ganglionic level infarction (caudate, lentiform nuclei, internal capsule, insula, M1-M3 cortex): 6 - Supraganglionic infarction (M4-M6 cortex): 3 Total score (0-10 with 10 being normal): 9  IMPRESSION: 1. 1 cm hypodensity at the  right putamen, somewhat age indeterminate, but could reflect an acute to subacute ischemic infarct. This is new as compared to recent MRI and CT from 12/20/2020. No intracranial hemorrhage. 2. ASPECTS is 9. Critical Value/emergent results were called by telephone at the time of interpretation on 01/06/2021 at 3:24 am to provider [facility] , who verbally acknowledged these results. Electronically Signed   By: Jeannine Boga M.D.   On: 01/06/2021 03:29        Scheduled Meds: . amoxicillin-clavulanate  1 tablet Oral BID  . aspirin EC  81 mg Oral Daily  . donepezil  5 mg Oral QHS  . fludrocortisone  0.1 mg Oral Daily  . gabapentin  900 mg Oral TID  . heparin  5,000 Units Subcutaneous Q8H  . [START ON 01/07/2021] levothyroxine  100 mcg Oral QAC breakfast  . LORazepam  1 mg Oral Daily  . rosuvastatin  20 mg Oral Daily  . ticagrelor  90 mg Oral BID   Continuous Infusions:   LOS: 0 days    Time spent: 20 minutes    Edwin Dada, MD [facility] 01/06/2021, 4:08 PM     Please page though [place] or Epic secure chat:  For password, contact charge nurse

## 2021-01-06 NOTE — ED Notes (Signed)
CODE STROKE PAGED UPON ARRIVAL TO ER [id number]

## 2021-01-06 NOTE — Progress Notes (Incomplete)
Phone call [phone] Patient beeper went off 0303 Patient arrived and exam began 0309  Exam finished 0310 [facility], [facility] [facility] called and exam ended [phone]
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
## 2021-01-06 NOTE — ED Triage Notes (Signed)
Pt arrives with [facility] after wife called for possible stroke. Per wife pt was altered and dragging his left side when she got him up this morning. Pt initially unresponsive for EMS but then started to respond in route to ED.

## 2021-01-06 NOTE — ED Notes (Signed)
MSE not signed due to EDP being present during triage.

## 2021-01-06 NOTE — H&P (Signed)
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  ------------------------------------------------------------------------------------------------------------------ GFR: Estimated Creatinine Clearance: 48.1 mL/min (A) (by C-G formula based on SCr of 1.35 mg/dL (H)). Liver Function Tests: Recent Labs  Lab 01/06/21 0324  AST 34  ALT 35  ALKPHOS 37*  BILITOT 0.3  PROT 7.6  ALBUMIN 3.7   No results for input(s): LIPASE, AMYLASE in the last 168  hours. No results for input(s): AMMONIA in the last 168 hours. Coagulation Profile: Recent Labs  Lab 01/06/21 0324  INR 1.0   Cardiac Enzymes: No results for input(s): CKTOTAL, CKMB, CKMBINDEX, TROPONINI in the last 168 hours. BNP (last 3 results) No results for input(s): PROBNP in the last 8760 hours. HbA1C: No results for input(s): HGBA1C in the last 72 hours. CBG: No results for input(s): GLUCAP in the last 168 hours. Lipid Profile: No results for input(s): CHOL, HDL, LDLCALC, TRIG, CHOLHDL, LDLDIRECT in the last 72 hours. Thyroid Function Tests: No results for input(s): TSH, T4TOTAL, FREET4, T3FREE, THYROIDAB in the last 72 hours. Anemia Panel: No results for input(s): VITAMINB12,  FOLATE, FERRITIN, TIBC, IRON, RETICCTPCT in the last 72 hours.  --------------------------------------------------------------------------------------------------------------- Urine analysis:    Component Value Date/Time   COLORURINE STRAW (A) 01/06/2021 0515   APPEARANCEUR CLEAR 01/06/2021 0515   LABSPEC 1.028 01/06/2021 0515   LABSPEC 1.005 06/25/2011 1502   PHURINE 7.0 01/06/2021 0515   GLUCOSEU NEGATIVE 01/06/2021 0515   HGBUR NEGATIVE 01/06/2021 0515   BILIRUBINUR NEGATIVE 01/06/2021 0515   BILIRUBINUR Negative 06/25/2011 1502   KETONESUR NEGATIVE 01/06/2021 0515   PROTEINUR NEGATIVE 01/06/2021 0515   UROBILINOGEN 0.2 10/09/2011 1634   NITRITE NEGATIVE 01/06/2021 0515   LEUKOCYTESUR NEGATIVE 01/06/2021 0515   LEUKOCYTESUR Negative 06/25/2011 1502      Imaging Results:    DG Chest Portable 1 View  Result Date: 01/06/2021 CLINICAL DATA:  Altered mental status EXAM: PORTABLE CHEST 1 VIEW COMPARISON:  12/29/2020 FINDINGS: Heart is normal size. Aortic atherosclerosis. No confluent opacities or effusions. No acute bony abnormality area IMPRESSION: No active disease. Electronically Signed   By: Rolm Baptise M.D.   On: 01/06/2021 03:52   CT HEAD CODE STROKE WO CONTRAST  Result Date:  01/06/2021 CLINICAL DATA:  Code stroke. Initial evaluation for acute left-sided weakness EXAM: CT HEAD WITHOUT CONTRAST TECHNIQUE: Contiguous axial images were obtained from the base of the skull through the vertex without intravenous contrast. COMPARISON:  Prior CT and MRI from 12/20/2020. FINDINGS: Brain: Cerebral volume within normal limits for age. No acute intracranial hemorrhage. There is a new 1 cm hypodensity at the right lentiform nucleus/putamen, consistent with a small ischemic infarct, acute to subacute in appearance. This is new as compared to previous MRI and CT from 12/20/2020. No other evidence for acute large vessel territory infarct. No mass lesion, midline shift or mass effect. No hydrocephalus or extra-axial fluid collection. 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 Vascular: No visible hyperdense vessel. Skull: Scalp soft tissues and calvarium within normal limits. Sinuses/Orbits: Globes and orbital soft tissues demonstrate no acute finding. Paranasal sinuses are largely clear. No mastoid effusion. Other: None. ASPECTS ([place] Stroke Program Early CT Score) - Ganglionic level infarction (caudate, lentiform nuclei, internal capsule, insula, M1-M3 cortex): 6 - Supraganglionic infarction (M4-M6 cortex): 3 Total score (0-10 with 10 being normal): 9 IMPRESSION: 1. 1 cm hypodensity at the right putamen, somewhat age indeterminate, but could reflect an acute to subacute ischemic infarct. This is new as compared to recent MRI and CT from 12/20/2020. No intracranial hemorrhage. 2. ASPECTS is 9. Critical Value/emergent results were called by telephone at the time of interpretation on 01/06/2021 at 3:24 am to provider [facility] , who verbally acknowledged these results. Electronically Signed   By: Jeannine Boga M.D.   On: 01/06/2021 03:29    My personal review of EKG: Rhythm NSR, Rate 91/min, QTc 501,no Acute ST changes   Assessment & Plan:    Principal Problem:    CVA (cerebral vascular accident) ([place]) Active Problems:   Hypercholesterolemia   Hypothyroidism   Acute respiratory failure with hypoxia (HCC)   Aspiration pneumonia ([place])   Acute encephalopathy   1. CVA 1. Telemetry neuro recommends admission to telemetry floor 2. MRI -not available anytime this weekend and since the reason for the transfer to [facility] 3. CT head shows new right putamen infarct 4. Continue aspirin and Brilinta 5. Continue Repatha and statin 6. Hemoglobin A1c was recently done 17 days ago, not repeated 7. Lipid panel was also recently performed with an LDL 38 and HDL 62 8. PT OT ST consult 9.  Inpatient neurology consult 10. Monitor on telemetry 2. Aspiration pneumonia 1. Patient discharged with Augmentin to finish out a 10-day course 2. Patient is 2 doses short of finishing his 10-day course 3. Continue last 2 doses of Augmentin 3. Acute respiratory failure with hypoxia 1. Oxygen sats as low as 70s reported at home 2. Oxygen sats dipping into the 80s on 3 L nasal cannula here mostly while sleeping 3. No known history of sleep apnea 4. Chest x-ray shows no active disease 5. Continue supplement with 3 L nasal cannula 6. Wean off as tolerated 7. Continue to monitor 4. Leukocytosis 1. Up from 7.6-14.5 2. UA is not indicative of UTI 3. Chest x-ray shows no active disease 4. No specific infectious symptoms or signs reported by wife 5. At this point most likely stress reaction 6. Patient is also on Florinef  7. Continue to monitor 5. Acute encephalopathy 1. Patient was unresponsive at the time that EMS arrived, likely secondary to hypotension and hypoxia 2. Patient is trending towards normal mentation at this time 3. Continue with MRI brain and continue to monitor 6. Hyperlipidemia 1. Continue Repatha and statin 7. Hypothyroidism 1. Continue Synthroid   DVT Prophylaxis-   Heparin- SCDs   AM Labs Ordered, also please review Full Orders  Family  Communication: Admission, patients condition and plan of care including tests being ordered have been discussed with the patient and wife, [name] who indicate understanding and agree with the plan and Code Status.  Code Status: Full  Admission status: Observation Time spent in minutes : [place]

## 2021-01-06 NOTE — Care Management Note (Signed)
Pt seen in the ED  This morning ,  denies any pain or weakness .   PE: speech is clear , no distress  Heart: RRR Chest: clear , no wheezing   He is  Being transferred to [facility]. He will be on [facility] 4  DR [place]ford when he arrives @ [facility].

## 2021-01-07 DIAGNOSIS — J9601 Acute respiratory failure with hypoxia: Secondary | ICD-10-CM

## 2021-01-07 DIAGNOSIS — E039 Hypothyroidism, unspecified: Secondary | ICD-10-CM | POA: Diagnosis not present

## 2021-01-07 DIAGNOSIS — Z951 Presence of aortocoronary bypass graft: Secondary | ICD-10-CM | POA: Diagnosis not present

## 2021-01-07 DIAGNOSIS — E7800 Pure hypercholesterolemia, unspecified: Secondary | ICD-10-CM

## 2021-01-07 DIAGNOSIS — I63231 Cerebral infarction due to unspecified occlusion or stenosis of right carotid arteries: Secondary | ICD-10-CM | POA: Diagnosis not present

## 2021-01-07 DIAGNOSIS — I951 Orthostatic hypotension: Secondary | ICD-10-CM

## 2021-01-07 DIAGNOSIS — I639 Cerebral infarction, unspecified: Secondary | ICD-10-CM | POA: Diagnosis not present

## 2021-01-07 DIAGNOSIS — J690 Pneumonitis due to inhalation of food and vomit: Secondary | ICD-10-CM | POA: Diagnosis not present

## 2021-01-07 DIAGNOSIS — I6381 Other cerebral infarction due to occlusion or stenosis of small artery: Secondary | ICD-10-CM | POA: Diagnosis not present

## 2021-01-07 LAB — CBC
HCT: 41.3 % (ref 39.0–52.0)
Hemoglobin: 13.5 g/dL (ref 13.0–17.0)
MCH: 32.7 pg (ref 26.0–34.0)
MCHC: 32.7 g/dL (ref 30.0–36.0)
MCV: 100 fL (ref 80.0–100.0)
Platelets: 252 10*3/uL (ref 150–400)
RBC: 4.13 MIL/uL — ABNORMAL LOW (ref 4.22–5.81)
RDW: 14 % (ref 11.5–15.5)
WBC: 10.1 10*3/uL (ref 4.0–10.5)
nRBC: 0 % (ref 0.0–0.2)

## 2021-01-07 LAB — COMPREHENSIVE METABOLIC PANEL
ALT: 32 U/L (ref 0–44)
AST: 37 U/L (ref 15–41)
Albumin: 3.3 g/dL — ABNORMAL LOW (ref 3.5–5.0)
Alkaline Phosphatase: 37 U/L — ABNORMAL LOW (ref 38–126)
Anion gap: 6 (ref 5–15)
BUN: 19 mg/dL (ref 8–23)
CO2: 30 mmol/L (ref 22–32)
Calcium: 9.3 mg/dL (ref 8.9–10.3)
Chloride: 103 mmol/L (ref 98–111)
Creatinine, Ser: 1.27 mg/dL — ABNORMAL HIGH (ref 0.61–1.24)
GFR, Estimated: 57 mL/min — ABNORMAL LOW (ref >60)
Glucose, Bld: 109 mg/dL — ABNORMAL HIGH (ref 70–99)
Potassium: 3.9 mmol/L (ref 3.5–5.1)
Sodium: 139 mmol/L (ref 135–145)
Total Bilirubin: 0.3 mg/dL (ref 0.3–1.2)
Total Protein: 7 g/dL (ref 6.5–8.1)

## 2021-01-07 MED ORDER — AMLODIPINE BESYLATE 5 MG PO TABS: 2.5000 mg | ORAL_TABLET | Freq: Every day | ORAL | Status: DC | PRN

## 2021-01-07 NOTE — Evaluation (Signed)
Occupational Therapy Evaluation Patient Details Name: George Bradley MRN: [id number] DOB: 07-Jul-1941 Today's Date: 01/07/2021    History of Present Illness Patient is a 80 y/o male who presents on 01/06/21 with left sided weakness and AMS. Head CT- new right putamen infarct. 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 PMH includes bil renal artery stenosis, CKD, CAD, chronic orthostatic, tongue cancer with chronic dysphagia.   Clinical Impression   PTA patient reports independent and driving. Admitted for above and limited by problem list below, including impaired balance, decreased activity tolerance and decreased safety awareness.  He has hx of cognitive deficits (moderate dementia) per chart, he is oriented, follows simple commands but with deficits noted in memory, safety and problem solving. He currently requires supervision for transfers and in room mobility, supervision for ADLs.  Believe he will benefit from further OT services while admitted to optimize independence and safety, but anticipate no further needs after dc home.      Follow Up Recommendations  No OT follow up;Supervision/Assistance - 24 hour (inital 24/7 supervision)    Equipment Recommendations  None recommended by OT    Recommendations for Other Services       Precautions / Restrictions Precautions Precautions: Fall;Other (comment) Precaution Comments: orthostatic Restrictions Weight Bearing Restrictions: No      Mobility Bed Mobility Overal bed mobility: Needs Assistance Bed Mobility: Supine to Sit     Supine to sit: Supervision;HOB elevated     General bed mobility comments: No assist needed.    Transfers Overall transfer level: Needs assistance Equipment used: None Transfers: Sit to/from Stand Sit to Stand: Supervision         General transfer comment: close  supervision for safety    Balance Overall balance assessment: Needs assistance Sitting-balance support: Feet supported;No upper extremity supported Sitting balance-Leahy Scale: Good     Standing balance support: During functional activity;No upper extremity supported Standing balance-Leahy Scale: Fair Standing balance comment: close supervision dynamically                           ADL either performed or assessed with clinical judgement   ADL Overall ADL's : Needs assistance/impaired     Grooming: Wash/dry hands;Supervision/safety;Standing   Upper Body Bathing: Set up;Supervision/ safety;Sitting   Lower Body Bathing: Supervison/ safety;Set up;Sit to/from stand   Upper Body Dressing : Set up;Supervision/safety;Sitting   Lower Body Dressing: Supervision/safety;Set up;Sit to/from stand   Toilet Transfer: Supervision/safety;Ambulation Toilet Transfer Details (indicate cue type and reason): for safety, simulated in room     Tub/ Shower Transfer: Walk-in shower;Supervision/safety;Ambulation;Shower seat;Grab Paediatric nurse Details (indicate cue type and reason): simulated in room Functional mobility during ADLs: Supervision/safety General ADL Comments: pt performing near baseline function, supervision for transfer sand inroom mobility.  Reports slightly "off balance" comapred to normal.     Vision Baseline Vision/History: Wears glasses Wears Glasses:  (Normally has double vision in left eye (corrected with glasses).) Vision Assessment?: No apparent visual deficits Additional Comments: no visual deficits noted functionally and pt does not reports visual deficits during session     Perception     Praxis      Pertinent Vitals/Pain Pain Assessment: No/denies pain     Hand Dominance Right   Extremity/Trunk Assessment Upper Extremity Assessment Upper Extremity Assessment: Overall WFL for tasks assessed   Lower  Extremity Assessment Lower Extremity  Assessment: Defer to PT evaluation   Cervical / Trunk Assessment Cervical / Trunk Assessment: Normal   Communication Communication Communication: No difficulties   Cognition Arousal/Alertness: Awake/alert Behavior During Therapy: WFL for tasks assessed/performed Overall Cognitive Status: History of cognitive impairments - at baseline                                 General Comments: hx of cognitive deficits at baseline, noted decreased attention, safety and memory.  (Per PT session with speaking to spouse, at baseline).   General Comments       Exercises     Shoulder Instructions      Home Living Family/patient expects to be discharged to:: Private residence Living Arrangements: Spouse/significant other Available Help at Discharge: Family;Available 24 hours/day Type of Home: House Home Access: Stairs to enter [employer] of Steps: 3 thru the garage Entrance Stairs-Rails: Right Home Layout: Two level;Able to live on main level with bedroom/bathroom Alternate Level Stairs-Number of Steps: Flight   Bathroom Shower/Tub: Occupational psychologist: Handicapped height     Home Equipment: Civil engineer, contracting - built in;Hand held Tourist information centre manager - 2 wheels;Cane - single point;Cane - quad;Wheelchair - manual;Walker - 4 wheels;Grab bars - toilet;Grab bars - tub/shower   Additional Comments: Wife is a retired acute PT      Prior Functioning/Environment Level of Independence: Independent        Comments: Performing ADLs, light IADLs- riding lawn mower, and driving. (Reports spouse is trying to convince him not to drive.)        OT Problem List: Decreased activity tolerance;Impaired balance (sitting and/or standing);Decreased cognition;Decreased safety awareness;Decreased knowledge of use of DME or AE;Decreased knowledge of precautions      OT Treatment/Interventions: Self-care/ADL training;Therapeutic exercise;Energy conservation;DME and/or AE  instruction;Therapeutic activities;Patient/family education    OT Goals(Current goals can be found in the care plan section) Acute Rehab OT Goals Patient Stated Goal: go home OT Goal Formulation: With patient Time For Goal Achievement: 01/21/21 Potential to Achieve Goals: Good  OT Frequency: Min 2X/week   Barriers to D/C:            Co-evaluation              AM-PAC OT "6 Clicks" Daily Activity     Outcome Measure Help from another person eating meals?: None Help from another person taking care of personal grooming?: A Little Help from another person toileting, which includes using toliet, bedpan, or urinal?: A Little Help from another person bathing (including washing, rinsing, drying)?: A Little Help from another person to put on and taking off regular upper body clothing?: A Little Help from another person to put on and taking off regular lower body clothing?: A Little 6 Click Score: 19   End of Session Nurse Communication: Mobility status;Precautions  Activity Tolerance: Patient tolerated treatment well Patient left: in chair;with call bell/phone within reach;with chair alarm set  OT Visit Diagnosis: Unsteadiness on feet (R26.81)                Time: 4166-0630 OT Time Calculation (min): 21 min Charges:  OT General Charges $OT Visit: 1 Visit OT Evaluation $OT Eval Moderate Complexity: 1 Mod  Jolaine Artist, OT Acute Rehabilitation Services Pager [phone] Office [phone]   Delight Stare 01/07/2021, 10:00 AM

## 2021-01-07 NOTE — Plan of Care (Signed)

## 2021-01-07 NOTE — Discharge Summary (Addendum)
Physician Discharge Summary  EVON LOPEZPEREZ MNO:[id number] DOB: [birth year] DOA: 01/06/2021  PCP: [name], MD  Admit date: 01/06/2021 Discharge date: 01/07/2021  Admitted From: Home  Disposition:  Home   Recommendations for Outpatient Follow-up and new medication changes:  1. Follow up with Dr. Virgina Jock in 7 to 10 days.  2. Continue with fludrocortisone for orthostatic hypotension.  3. Outpatient referral for sleep study.  4. Continue taking amlodipine only as needed, use 2.5 mg instead of 5 mg.   Home Health: yes   Equipment/Devices: na    Discharge Condition: stable  CODE STATUS: full  Diet recommendation: heart healthy   Brief/Interim Summary: Mr. 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  Rehospitalization April 6 for aspiration pneumonia.  He also has the diagnosis of renal artery stenosis, orthostatic hypotension, hypothyroidism, dyslipidemia, GERD, BPH and aortic stenosis.   He reported new onset of left-sided weakness.On the day of admission he had persistent ambulatory dysfunction with left lower extremity weakness.  Progressive symptoms to the point where he was unable to move and became unresponsive.  When EMS arrived his oximetry was 79%, and his blood pressure systolic was 60. In the emergency department blood pressure 145/87, heart rate 83, temperature 98.4, respiratory rate 12, oxygenation 91% Patient had decreased strength in the left lower extremity, his lungs are clear to auscultation bilaterally, heart S1-S2, present rhythmic, soft abdomen, no lower extremity edema.   Sodium 139, potassium 4.3, chloride 104, bicarb 26, glucose 168, BUN 24, creatinine 1.34, white count 14.5, hemoglobin 13.0, hematocrit  40.5, platelets 222. SARS COVID-19 negative. Urinalysis specific gravity 1.028, negative leukocytes. Toxicology positive for benzodiazepines.  Head CT with 1 cm hypodensity at the right putamen, age-indeterminate, could reflect acute to subacute ischemic infarct. Chest radiograph no infiltrates.  EKG 91 bpm, left axis deviation, left anterior fascicular block, right bundle branch block, QTC 504, first-degree AV block, sinus rhythm, no significant ST segment or T wave changes.  Brain MRI with 14 mm acute infarct in the right internal capsule/lentiform nucleus.  Chronic cortical infarcts within the left occipital lobe.  Small chronic infarct within the left cerebellar hemisphere.  1.  Acute right internal capsule/lentiform nucleus ischemic infarct.  Patient's condition improved, he received intravenous fluids for his orthostatic hypotension. Neurology recommended continue with dual antiplatelet therapy with aspirin and ticagrelor. Continue statin therapy.  2.  Orthostatic hypotension.  Continue fludrocortisone, taking amlodipine only as needed, recommendation to take 2.5 mg instead of 5 mg. Follow-up with cardiology as an outpatient.  3.  Aspiration pneumonia, present on admission.  He completed therapy with Augmentin.  4.  Coronary artery disease/dyslipidemia.  Continue aspirin, statin and ticagrelor.  Patient remained chest pain-free.  Continue statin therapy.  5.  Hypothyroidism.  Continue levothyroxine.  6.  History of oral cancer/neuropathy.  Continue low-carb pain and gabapentin.  7. Depression/ dementia. Continue with amitriptyline, donepezil, buspirone and lorazepam.   Discharge Diagnoses:  Principal Problem:   CVA (cerebral vascular accident) ([place]) Active Problems:   Hypercholesterolemia   Hypothyroidism   Acute respiratory failure with hypoxia ([place])   Aspiration pneumonia ([place])   Acute encephalopathy    Discharge Instructions   Allergies as of 01/07/2021       Reactions   Zetia [ezetimibe] Other (See  Comments)   "made me feel bad"      Medication List    STOP taking these medications   amoxicillin-clavulanate 875-125 MG tablet Commonly known as: Augmentin     TAKE these medications   amitriptyline 25 MG tablet Commonly known as: ELAVIL TAKE 1 TABLET(25 MG) BY MOUTH AT BEDTIME What changed: See the new instructions.   amLODipine 5 MG tablet Commonly known as: NORVASC Take 0.5 tablets (2.5 mg total) by mouth daily as needed (blood pressure on the higher end, no parameter). What changed:   how much to take  when to take this  reasons to take this   aspirin 81 MG EC tablet Take 1 tablet (81 mg total) by mouth daily. Swallow whole.   busPIRone 5 MG tablet Commonly known as: BUSPAR Take 5 mg by mouth 2 (two) times daily as needed (anxiety).   donepezil 5 MG tablet Commonly known as: ARICEPT Take 5 mg by mouth at bedtime.   fludrocortisone 0.1 MG tablet Commonly known as: FLORINEF Take 0.1 mg by mouth daily.   gabapentin 300 MG capsule Commonly known as: NEURONTIN Take 3 capsules (900 mg total) by mouth 3 (three) times daily.   levothyroxine 100 MCG tablet Commonly known as: SYNTHROID TAKE ONE TABLET BY MOUTH ONCE DAILY BEFORE  BREAKFAST What changed: See the new instructions.   LORazepam 1 MG tablet Commonly known as: ATIVAN TAKE 1 TABLET BY MOUTH THREE TIMES DAILY   pilocarpine 7.5 MG tablet Commonly known as: SALAGEN Take 7.5 mg by mouth 3 (three) times daily.   Repatha SureClick 045 MG/ML Soaj Generic drug: Evolocumab Inject 140 mg into the skin every 14 (fourteen) days.   rosuvastatin 20 MG tablet Commonly known as: CRESTOR TAKE 1 TABLET BY MOUTH DAILY   ticagrelor 90 MG Tabs tablet Commonly known as: BRILINTA Take 1 tablet (90 mg total) by mouth 2 (two) times daily.   traMADol 50 MG tablet Commonly known as: ULTRAM Take 50 mg by mouth 2 (two) times daily as needed for moderate pain.        Allergies  Allergen Reactions  . Zetia [Ezetimibe] Other (See Comments)    "made me feel bad"    Consultations:  Neurology    Procedures/Studies: CT Angio Head W or Wo Contrast  Result Date: 01/06/2021 CLINICAL DATA:  Initial evaluation for acute stroke, left-sided weakness, now resolved. EXAM: CT ANGIOGRAPHY HEAD AND NECK CT PERFUSION BRAIN TECHNIQUE: Multidetector CT imaging of the head and neck was performed using the standard protocol during bolus administration of intravenous contrast. Multiplanar CT image reconstructions and MIPs were obtained to evaluate the vascular anatomy. Carotid stenosis measurements (when applicable) are obtained utilizing NASCET criteria, using the distal internal carotid diameter as the denominator. Multiphase CT imaging of the brain was performed following IV bolus contrast injection. Subsequent parametric perfusion maps were calculated using RAPID software. CONTRAST:  185mL OMNIPAQUE IOHEXOL 350 MG/ML SOLN COMPARISON:  Prior CT from earlier the same day as well as previous studies from 12/20/2020 and 12/19/2020. FINDINGS: CTA NECK FINDINGS Aortic arch: Visualized aortic arch normal in caliber with normal 3 vessel morphology. Moderate atheromatous change about the arch and origin of the great vessels without high-grade stenosis. Right carotid system: Right CCA is now widely patent from its origin to the bifurcation. Intravascular stent traverses the right bifurcation extending into the proximal right ICA. Moderate stenosis of the stent at the level of the native bifurcation of up to approximately 50%. Stent is otherwise widely patent without  intraluminal thrombus or other complication. Right ICA patent distally to the skull base without stenosis, dissection or occlusion. Left carotid system: Left CCA tortuous proximally but is widely patent to the bifurcation without stenosis. Mild calcified plaque about the left bifurcation/proximal left ICA without significant  stenosis. Left ICA patent distally to the skull base without stenosis, dissection or occlusion. Vertebral arteries: Both vertebral arteries arise from the subclavian arteries. Calcified plaque at the origin of the right subclavian artery with associated mild stenosis again noted. Atheromatous plaque at the origins of both vertebral arteries M cells with associated mild stenosis noted as well, also stable. Left vertebral artery slightly dominant. Vertebral arteries otherwise remain widely patent within the neck without stenosis, dissection or occlusion. Skeleton: No acute osseous finding. No discrete or worrisome osseous lesions. Mild multilevel degenerative spondylosis and facet arthrosis without high-grade stenosis. Other neck: No other acute soft tissue abnormality within the neck. No mass or adenopathy. Post radiation changes again noted. Upper chest: Post radiation scarring noted at the upper lungs bilaterally. Visualized upper chest demonstrates no other acute finding. Review of the MIP images confirms the above findings CTA HEAD FINDINGS Anterior circulation: Petrous segments are now both widely patent. Atheromatous plaque throughout the carotid siphons with no more than mild stenosis at the para clinoid segments. A1 segments widely patent. Normal anterior communicating artery complex. Anterior cerebral arteries remain widely patent within the neck. No M1 stenosis or occlusion. Right M1 bifurcates early. No proximal MCA branch occlusion. Distal MCA branches well perfused and symmetric. Posterior circulation: Both V4 segments remain patent to the vertebrobasilar junction. Both PICA origins are patent and normal. Basilar widely patent to its distal aspect. Superior cerebellar arteries patent bilaterally. Both PCAs are primarily supplied via the basilar. Mild atheromatous irregularity within the PCAs bilaterally without significant stenosis. Venous sinuses: Grossly patent allowing for timing the contrast bolus.  Anatomic variants: None significant.  No aneurysm. Review of the MIP images confirms the above findings CT Brain Perfusion Findings: ASPECTS: 9. IMPRESSION: CTA HEAD AND NECK IMPRESSION: 1. Negative CTA for emergent large vessel occlusion. 2. Interval revascularization of the right common and internal carotid arteries since previous exam, with vascular stent in place across the right carotid bifurcation. Focal narrowing of the stent by approximately 50% at the level of the native bifurcation. Otherwise, widely patent flow seen through the stent. 3. 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 4. Mild bilateral vertebral artery origins stenoses, also stable. 5.  Aortic Atherosclerosis (ICD10-I70.0). CT PERFUSION IMPRESSION: The CT perfusion portion of this exam unfortunately failed, with no perfusion maps generated. Results were called by telephone at the time of interpretation on 01/06/2021 at 4:00 am to provider [facility] , who verbally acknowledged these results. Electronically Signed   By: Jeannine Boga M.D.   On: 01/06/2021 04:45   CT Angio Head W or Wo Contrast  Result Date: 12/19/2020 CLINICAL DATA:  Worsening weakness and dizzy spells for 1 month. Small acute right MCA infarcts on MRI with evidence of right ICA occlusion. EXAM: CT ANGIOGRAPHY HEAD AND NECK TECHNIQUE: Multidetector CT imaging of the head and neck was performed using the standard protocol during bolus administration of intravenous contrast. Multiplanar CT image reconstructions and MIPs were obtained to evaluate the vascular anatomy. Carotid stenosis measurements (when applicable) are obtained utilizing NASCET criteria, using the distal internal carotid diameter as the denominator. CONTRAST:  194mL OMNIPAQUE IOHEXOL 350 MG/ML SOLN COMPARISON:  Head MRI 12/19/2020 FINDINGS: CTA NECK FINDINGS Aortic arch: Standard 3 vessel  aortic arch with mild-to-moderate calcified plaque. No evidence of significant  arch vessel origin stenosis. Right carotid system: The common carotid artery is occluded at its origin without common or internal carotid artery reconstitution in the neck. There is reconstitution of ECA branches. Left carotid system: Patent with mild calcified plaque at the carotid bifurcation. No evidence of significant stenosis or dissection. Vertebral arteries: Calcified plaque results in mild stenosis of the proximal right subclavian artery. The vertebral arteries are patent with the left being mildly dominant. There is mild stenosis of both vertebral artery origins due to calcified plaque. Skeleton: Moderate cervical disc and facet degeneration. Other neck: No evidence of cervical lymphadenopathy or mass. Upper chest: Scarring/potential post radiation fibrosis in the lung apices. Coronary atherosclerosis. Review of the MIP images confirms the above findings CTA HEAD FINDINGS Anterior circulation: There is partial reconstitution of the petrous segment of the right ICA, however the cavernous segment appears largely occluded. There is more robust reconstitution of the right supraclinoid ICA. The intracranial left ICA is patent with mild calcified plaque not resulting in significant stenosis. ACAs and MCAs are patent without evidence of a proximal branch occlusion or significant proximal stenosis. There is slight diffuse asymmetric attenuation of the right MCA compared to the left. No aneurysm is identified. Posterior circulation: The intracranial vertebral arteries are patent with to the basilar at most mild atherosclerotic narrowing bilaterally. Patent PICA and SCA origins are identified bilaterally. The basilar artery is widely patent. There is a small right posterior communicating artery. Both PCAs are patent with mild atherosclerotic irregularity but no evidence of a significant proximal stenosis. No aneurysm is identified. Venous sinuses: Patent. Anatomic variants: None. Review of the MIP images confirms the  above findings IMPRESSION: 1. 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 2. Mild bilateral vertebral artery origin stenoses. 3. Left-sided carotid atherosclerosis without significant stenosis. 4. Aortic Atherosclerosis (ICD10-I70.0). Electronically Signed   By: Logan Bores M.D.   On: 12/19/2020 16:31   CT HEAD WO CONTRAST  Result Date: 12/20/2020 CLINICAL DATA:  Right carotid and MCA thrombectomy. Right MCA territory CVA. EXAM: CT HEAD WITHOUT CONTRAST TECHNIQUE: Contiguous axial images were obtained from the base of the skull through the vertex without intravenous contrast. COMPARISON:  12/19/2020 FINDINGS: Brain: Normal anatomic configuration. Parenchymal volume loss is commensurate with the patient's age. Mild periventricular white matter changes are present likely reflecting the sequela of small vessel ischemia. Remote left occipital cortical infarct is unchanged. No abnormal intra or extra-axial mass lesion or fluid collection. No abnormal mass effect or midline shift. No evidence of acute intracranial hemorrhage or infarct. Ventricular size is normal. Cerebellum unremarkable. Vascular: No asymmetric hyperdense vasculature at the skull base. Skull: Intact Sinuses/Orbits: Paranasal sinuses are clear. Orbits are unremarkable. Other: Mastoid air cells and middle ear cavities are clear. IMPRESSION: No evidence of acute intracranial hemorrhage or infarct. Known tiny punctate cortical infarcts within the right temporoparietal regions on a prior MRI examination are not well appreciated on this exam. Electronically Signed   By: Fidela Salisbury MD   On: 12/20/2020 01:50   CT Head Wo Contrast  Result Date: 12/19/2020 CLINICAL DATA:  Motor neuron disease. Additional history provided: Patient reports worsening weakness and dizzy spells for 1 month, symptoms worsening this week, fall 2 days ago. EXAM: CT HEAD WITHOUT CONTRAST TECHNIQUE: Contiguous  axial images were obtained from the base of the skull through the vertex without intravenous contrast. COMPARISON:  Brain MRI 06/14/2017.  FINDINGS: Brain: Mild cerebral and cerebellar atrophy. A small cortically based infarct within the left occipital lobe (PCA vascular territory) is new as compared to the brain MRI of 06/14/2017, but otherwise age-indeterminate. There is no acute intracranial hemorrhage. No extra-axial fluid collection. No evidence of intracranial mass. No midline shift. Vascular: No hyperdense vessel.  Atherosclerotic calcifications. Skull: Normal. Negative for fracture or focal lesion. Sinuses/Orbits: Visualized orbits show no acute finding. Small mucous retention cyst within the inferior right frontal sinus. IMPRESSION: A small cortically based infarct within the left occipital lobe (PCA vascular territory) is new from the brain MRI of 06/14/2017, but otherwise age-indeterminate. Correlate with findings on the pending brain MRI. Stable mild generalized parenchymal atrophy. Small right frontal sinus mucous retention cyst. Electronically Signed   By: Kellie Simmering DO   On: 12/19/2020 14:18   CT Angio Neck W and/or Wo Contrast  Result Date: 01/06/2021 CLINICAL DATA:  Initial evaluation for acute stroke, left-sided weakness, now resolved. EXAM: CT ANGIOGRAPHY HEAD AND NECK CT PERFUSION BRAIN TECHNIQUE: Multidetector CT imaging of the head and neck was performed using the standard protocol during bolus administration of intravenous contrast. Multiplanar CT image reconstructions and MIPs were obtained to evaluate the vascular anatomy. Carotid stenosis measurements (when applicable) are obtained utilizing NASCET criteria, using the distal internal carotid diameter as the denominator. Multiphase CT imaging of the brain was performed following IV bolus contrast injection. Subsequent parametric perfusion maps were calculated using RAPID software. CONTRAST:  160mL OMNIPAQUE IOHEXOL 350 MG/ML SOLN  COMPARISON:  Prior CT from earlier the same day as well as previous studies from 12/20/2020 and 12/19/2020. FINDINGS: CTA NECK FINDINGS Aortic arch: Visualized aortic arch normal in caliber with normal 3 vessel morphology. Moderate atheromatous change about the arch and origin of the great vessels without high-grade stenosis. Right carotid system: Right CCA is now widely patent from its origin to the bifurcation. Intravascular stent traverses the right bifurcation extending into the proximal right ICA. Moderate stenosis of the stent at the level of the native bifurcation of up to approximately 50%. Stent is otherwise widely patent without intraluminal thrombus or other complication. Right ICA patent distally to the skull base without stenosis, dissection or occlusion. Left carotid system: Left CCA tortuous proximally but is widely patent to the bifurcation without stenosis. Mild calcified plaque about the left bifurcation/proximal left ICA without significant stenosis. Left ICA patent distally to the skull base without stenosis, dissection or occlusion. Vertebral arteries: Both vertebral arteries arise from the subclavian arteries. Calcified plaque at the origin of the right subclavian artery with associated mild stenosis again noted. Atheromatous plaque at the origins of both vertebral arteries M cells with associated mild stenosis noted as well, also stable. Left vertebral artery slightly dominant. Vertebral arteries otherwise remain widely patent within the neck without stenosis, dissection or occlusion. Skeleton: No acute osseous finding. No discrete or worrisome osseous lesions. Mild multilevel degenerative spondylosis and facet arthrosis without high-grade stenosis. Other neck: No other acute soft tissue abnormality within the neck. No mass or adenopathy. Post radiation changes again noted. Upper chest: Post radiation scarring noted at the upper lungs bilaterally. Visualized upper chest demonstrates no other  acute finding. Review of the MIP images confirms the above findings CTA HEAD FINDINGS Anterior circulation: Petrous segments are now both widely patent. Atheromatous plaque throughout the carotid siphons with no more than mild stenosis at the para clinoid segments. A1 segments widely patent. Normal anterior communicating artery complex. Anterior cerebral arteries remain widely patent within the  neck. No M1 stenosis or occlusion. Right M1 bifurcates early. No proximal MCA branch occlusion. Distal MCA branches well perfused and symmetric. Posterior circulation: Both V4 segments remain patent to the vertebrobasilar junction. Both PICA origins are patent and normal. Basilar widely patent to its distal aspect. Superior cerebellar arteries patent bilaterally. Both PCAs are primarily supplied via the basilar. Mild atheromatous irregularity within the PCAs bilaterally without significant stenosis. Venous sinuses: Grossly patent allowing for timing the contrast bolus. Anatomic variants: None significant.  No aneurysm. Review of the MIP images confirms the above findings CT Brain Perfusion Findings: ASPECTS: 9. IMPRESSION: CTA HEAD AND NECK IMPRESSION: 1. Negative CTA for emergent large vessel occlusion. 2. Interval revascularization of the right common and internal carotid arteries since previous exam, with vascular stent in place across the right carotid bifurcation. Focal narrowing of the stent by approximately 50% at the level of the native bifurcation. Otherwise, widely patent flow seen through the stent. 3. 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 4. Mild bilateral vertebral artery origins stenoses, also stable. 5.  Aortic Atherosclerosis (ICD10-I70.0). CT PERFUSION IMPRESSION: The CT perfusion portion of this exam unfortunately failed, with no perfusion maps generated. Results were called by telephone at the time of interpretation on 01/06/2021 at 4:00 am to provider [facility] , who  verbally acknowledged these results. Electronically Signed   By: Jeannine Boga M.D.   On: 01/06/2021 04:45   CT Angio Neck W and/or Wo Contrast  Result Date: 12/19/2020 CLINICAL DATA:  Worsening weakness and dizzy spells for 1 month. Small acute right MCA infarcts on MRI with evidence of right ICA occlusion. EXAM: CT ANGIOGRAPHY HEAD AND NECK TECHNIQUE: Multidetector CT imaging of the head and neck was performed using the standard protocol during bolus administration of intravenous contrast. Multiplanar CT image reconstructions and MIPs were obtained to evaluate the vascular anatomy. Carotid stenosis measurements (when applicable) are obtained utilizing NASCET criteria, using the distal internal carotid diameter as the denominator. CONTRAST:  115mL OMNIPAQUE IOHEXOL 350 MG/ML SOLN COMPARISON:  Head MRI 12/19/2020 FINDINGS: CTA NECK FINDINGS Aortic arch: Standard 3 vessel aortic arch with mild-to-moderate calcified plaque. No evidence of significant arch vessel origin stenosis. Right carotid system: The common carotid artery is occluded at its origin without common or internal carotid artery reconstitution in the neck. There is reconstitution of ECA branches. Left carotid system: Patent with mild calcified plaque at the carotid bifurcation. No evidence of significant stenosis or dissection. Vertebral arteries: Calcified plaque results in mild stenosis of the proximal right subclavian artery. The vertebral arteries are patent with the left being mildly dominant. There is mild stenosis of both vertebral artery origins due to calcified plaque. Skeleton: Moderate cervical disc and facet degeneration. Other neck: No evidence of cervical lymphadenopathy or mass. Upper chest: Scarring/potential post radiation fibrosis in the lung apices. Coronary atherosclerosis. Review of the MIP images confirms the above findings CTA HEAD FINDINGS Anterior circulation: There is partial reconstitution of the petrous segment of  the right ICA, however the cavernous segment appears largely occluded. There is more robust reconstitution of the right supraclinoid ICA. The intracranial left ICA is patent with mild calcified plaque not resulting in significant stenosis. ACAs and MCAs are patent without evidence of a proximal branch occlusion or significant proximal stenosis. There is slight diffuse asymmetric attenuation of the right MCA compared to the left. No aneurysm is identified. Posterior circulation: The intracranial vertebral arteries are patent with to the basilar at most mild atherosclerotic narrowing bilaterally. Patent PICA  and SCA origins are identified bilaterally. The basilar artery is widely patent. There is a small right posterior communicating artery. Both PCAs are patent with mild atherosclerotic irregularity but no evidence of a significant proximal stenosis. No aneurysm is identified. Venous sinuses: Patent. Anatomic variants: None. Review of the MIP images confirms the above findings IMPRESSION: 1. 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 2. Mild bilateral vertebral artery origin stenoses. 3. Left-sided carotid atherosclerosis without significant stenosis. 4. Aortic Atherosclerosis (ICD10-I70.0). Electronically Signed   By: Logan Bores M.D.   On: 12/19/2020 16:31   MR BRAIN WO CONTRAST  Result Date: 01/06/2021 CLINICAL DATA:  Neuro deficit, acute, stroke suspected. EXAM: MRI HEAD WITHOUT CONTRAST TECHNIQUE: Multiplanar, multiecho pulse sequences of the brain and surrounding structures were obtained without intravenous contrast. COMPARISON:  Noncontrast head CT and CT angiogram head/neck as well as CT perfusion performed 01/06/2021. brain MRI 02/19/2021. FINDINGS: Brain: Mild intermittent motion degradation. Mild cerebral and cerebellar atrophy. There is a 14 mm focus of restricted diffusion within the right internal capsule/lentiform nucleus compatible  with acute infarction. Redemonstrated small chronic cortical infarct within the left occipital lobe (PCA vascular territory). Background mild multifocal T2/FLAIR hyperintensity within the cerebral white matter is nonspecific, but compatible with chronic small vessel ischemic disease. Redemonstrated small chronic infarct within the left cerebellar hemisphere. No evidence of intracranial mass. No chronic intracranial blood products. No extra-axial fluid collection. No midline shift. Vascular: Expected proximal arterial flow voids. Skull and upper cervical spine: No focal marrow lesion. Sinuses/Orbits: Visualized orbits show no acute finding. Trace bilateral ethmoid sinus mucosal thickening. Other: Right mastoid effusion. IMPRESSION: 14 mm acute infarct within the right internal capsule/lentiform nucleus. Redemonstrated small chronic cortical infarct within the left occipital lobe. Stable background mild generalized parenchymal atrophy and mild cerebral white matter chronic small vessel ischemic disease. Redemonstrated small chronic infarct within the left cerebellar hemisphere. Mild bilateral ethmoid sinus mucosal thickening. Right mastoid effusion. Electronically Signed   By: Kellie Simmering DO   On: 01/06/2021 14:16   MR BRAIN WO CONTRAST  Result Date: 12/20/2020 CLINICAL DATA:  80 year old male found to have right carotid occlusion, two small acute right MCA cortical infarcts on MRI yesterday. And repeat neck CTA yesterday demonstrating subsequent acute large vessel occlusion at the right MCA bifurcation. Then status post endovascular thrombectomy. EXAM: MRI HEAD WITHOUT CONTRAST TECHNIQUE: Multiplanar, multiecho pulse sequences of the brain and surrounding structures were obtained without intravenous contrast. COMPARISON:  Brain MRI 1418 hours yesterday, along with CTA head and neck x2 yesterday. FINDINGS: Brain: There remain only a few small cortical and subcortical white matter infarcts scattered in the right  MCA territory, such as on series 2, image 40. About six such areas are present now, versus the two seen yesterday. No contralateral left hemisphere or posterior fossa restricted diffusion. Minor cytotoxic edema in the affected areas with no acute hemorrhage. No mass effect. A few scattered chronic microhemorrhages in the brain, mostly the left hemisphere, are stable since yesterday. Stable pre-existing scattered white matter T2 and FLAIR hyperintensity, and a small area of chronic encephalomalacia in the left occipital pole. Stable small chronic left cerebellar infarct near midline. No midline shift, mass effect, evidence of mass lesion, ventriculomegaly, extra-axial collection or acute intracranial hemorrhage. Cervicomedullary junction and pituitary are within normal limits. Vascular: Major intracranial vascular flow voids are now normalized, including the right ICA siphon. Skull and upper cervical spine: Stable and negative for age. Sinuses/Orbits: Stable and  negative. Other: Trace mastoid fluid is stable. Negative visible scalp and face. IMPRESSION: 1. Following right carotid and MCA revascularization there are only a few scattered small cortical and subcortical white matter infarcts in the right MCA territory, in addition to the two seen by MRI yesterday. No hemorrhage or mass effect. 2. Small chronic left PCA territory infarct, small left cerebellar infarct. Mild for age white matter signal changes. Electronically Signed   By: Genevie Ann M.D.   On: 12/20/2020 06:59   MR BRAIN WO CONTRAST  Result Date: 12/19/2020 CLINICAL DATA:  Worsening ataxia over the past few weeks. EXAM: MRI HEAD WITHOUT CONTRAST TECHNIQUE: Multiplanar, multiecho pulse sequences of the brain and surrounding structures were obtained without intravenous contrast. COMPARISON:  Head CT 12/19/2020 and MRI 06/14/2017 FINDINGS: Brain: There are subcentimeter acute cortical infarcts at the right temporoparietal junction as well as more superiorly  in the right parietal lobe. The small left occipital infarct seen on CT is chronic. There is also a small chronic cortical infarct anterolaterally in the right frontal lobe. There is a small chronic infarct medially in the left cerebellar hemisphere. Scattered small foci of T2 hyperintensity in the cerebral white matter and pons have slightly progressed from the prior MRI and are nonspecific but compatible with mild chronic small vessel ischemic disease. There is mild generalized cerebral atrophy. Scattered chronic microhemorrhages in both cerebral hemispheres have slightly increased in number from the prior MRI. No mass, midline shift, or extra-axial fluid collection is identified. Vascular: Loss of the normal flow void of the distal cervical and proximal intracranial portions of the right internal carotid artery with reconstitution of the supraclinoid ICA flow void. Skull and upper cervical spine: Unremarkable bone marrow signal. Sinuses/Orbits: Bilateral cataract extraction. Minimal mucosal thickening in the paranasal sinuses. Small right mastoid effusion. Other: None. IMPRESSION: 1. Two small acute cortical infarcts in the right MCA territory. 2. Multiple small chronic cerebral and cerebellar infarcts as above. 3. Absent flow void in the distal cervical and proximal intracranial right internal carotid consistent with occlusion, new from 2018. Electronically Signed   By: Logan Bores M.D.   On: 12/19/2020 15:06   CT CHEST ABDOMEN PELVIS W CONTRAST  Result Date: 12/26/2020 CLINICAL DATA:  Abdominal pain, fever EXAM: CT CHEST, ABDOMEN, AND PELVIS WITH CONTRAST TECHNIQUE: Multidetector CT imaging of the chest, abdomen and pelvis was performed following the standard protocol during bolus administration of intravenous contrast. CONTRAST:  100 mL Omnipaque 300 IV COMPARISON:  CT abdomen 08/17/2020. FINDINGS: CT CHEST FINDINGS Cardiovascular: Cardiomegaly. Prior CABG. Diffuse aortic atherosclerosis. No aneurysm.  Mediastinum/Nodes: No mediastinal, hilar, or axillary adenopathy. Trachea and esophagus are unremarkable. Thyroid unremarkable. Lungs/Pleura: Airspace disease noted throughout the right middle lobe and both lower lobes most compatible with pneumonia. No effusions. Musculoskeletal: Chest wall soft tissues are unremarkable. No acute bony abnormality. CT ABDOMEN PELVIS FINDINGS Hepatobiliary: No focal hepatic abnormality. Gallbladder unremarkable. Pancreas: No focal abnormality or ductal dilatation. Spleen: No focal abnormality.  Normal size. Adrenals/Urinary Tract: No hydronephrosis or ureteral stones. Small cyst in the midpole of the right kidney. Adrenal glands and urinary bladder unremarkable. Stomach/Bowel: Large stool burden throughout the colon. No evidence of bowel obstruction. Appendix is normal. Vascular/Lymphatic: Aortoiliac atherosclerosis. No evidence of aneurysm or adenopathy. Reproductive: No visible focal abnormality. Other: No free fluid or free air. Musculoskeletal: No acute bony abnormality. Degenerative changes in the lumbar spine. IMPRESSION: Bilateral airspace disease throughout the right middle lobe and both lower lobes compatible with pneumonia. Large stool burden in  the colon. Prior CABG.  Diffuse aortic atherosclerosis. Electronically Signed   By: Rolm Baptise M.D.   On: 12/26/2020 22:56   IR CT Head Ltd  Result Date: 12/21/2020 INDICATION: 80 year old male with past medical history significant for aortic atherosclerosis, bilateral renal artery stenosis, chronic kidney disease, coronary artery, gastroesophageal reflux disease, hyperlipidemia, chronic orthostatic hypotension, and BPH. He initially presented to [facility] complaining of generalized weakness, tremors and vision changes. 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 During transferred to [facility] he was noted to have  sudden change in his neurological exam with sudden onset of left-sided weakness. Repeat CT angiogram showed a new right MCA/M1 occlusion in addition to his cervical occlusion. Acute neuro changes noted at 17:40 on 12/19/2020, NIHSS 19 at presentation to [facility], modified Rankin scale 0. He was then transferred to our service for emergency cerebral angiogram and thrombectomy. EXAM: ULTRASOUND-GUIDED VASCULAR ACCESS DIAGNOSTIC CEREBRAL ANGIOGRAM MECHANICAL THROMBECTOMY FLAT PANEL HEAD CT COMPARISON:  CT/CT angiogram of the head and neck December 19, 2020. MEDICATIONS: Cangrelor bolus and drip, IV. ANESTHESIA/SEDATION: The procedure was performed under general anesthesia. CONTRAST:  75 mL of Omnipaque 240 milligrams/mL. FLUOROSCOPY TIME:  Fluoroscopy Time: 30 minutes 24 seconds (941.1 mGy). COMPLICATIONS: None immediate. TECHNIQUE: Informed written consent was obtained from the patient's spouse after a thorough discussion of the procedural risks, benefits and alternatives. All questions were addressed. Maximal Sterile Barrier Technique was utilized including caps, mask, sterile gowns, sterile gloves, sterile drape, hand hygiene and skin antiseptic. A timeout was performed prior to the initiation of the procedure. The right groin was prepped and draped in the usual sterile fashion. Using a micropuncture kit and the modified Seldinger technique, access was gained to the right common femoral artery and an 8 French sheath was placed. Real-time ultrasound guidance was utilized for vascular access including the acquisition of a permanent ultrasound image documenting patency of the accessed vessel. Under fluoroscopy, an 8 [place] Walrus balloon guide catheter was navigated over a 6 [place] Berenstein 2 catheter and a 0.035" Terumo Glidewire into the aortic arch. The catheter was placed into the right common carotid artery frontal and lateral angiograms of the neck were obtained. FINDINGS: Filling defect consistent with clot within the  right common carotid artery, along the carotid bifurcation with contrast penetration into the cervical right ICA. Large filling defect within the bulb and proximal cervical right ICA and occlusive clot within the petrous segment of the right ICA. PROCEDURE: The Berenstein 2 catheter was then advanced over the wire into the right internal carotid artery. The walrus guide catheter was then advanced over the [facility] 2 catheter into the cervical right internal carotid artery. The Berenstein 2 and wire were removed. Manual aspiration performed until clear blood was seen. Large amount of clot retrieved. Right ICA angiograms were performed with frontal and lateral views of the upper neck and head. Recanalization of the cervical and intracranial right ICA noted as well as recanalization of the M1 segment. An occlusive clot within the distal right M2/MCA posterior division branch noted. Under biplane roadmap, a zoom 55 aspiration catheter was navigated over an Aristotle 14 microguidewire into the right M1/MCA. The aspiration catheter was then advanced to the level of occlusion and connected to a penumbra aspiration pump. The guiding catheter balloon was inflated. The aspiration catheter was removed under constant aspiration. Follow-up right ICA angiograms with frontal and lateral views of the head  showed complete recanalization of the right MCA territory. The guiding catheter was retracted into the right common carotid artery. Angioplasty at the right carotid bifurcation was performed with a 3 x 12 mm trek balloon. Right common carotid artery angiograms with frontal and lateral views of the neck showed improved caliber at the right carotid bifurcation, occlusion of the ECA and nonocclusive filling defect concerning for clot. Aspiration through the guiding catheter was performed. However, follow-up right common carotid artery angiogram showed progression of clot formation and near occlusion of the proximal right ICA. Flat  panel CT of the head was obtained and post processed in a separate workstation with concurrent attending physician supervision. Selected images were sent to PACS. No evidence of hemorrhagic complication seen. Patient was loaded on cangrelor. Subsequently, a 6-8 x 40 mm XACT carotid stent was deployed across the area of stenosis. Follow-up angiogram showed resolution of the stenosis with prompt fall or flow with no evidence of residual filling defect. Right internal carotid artery angiogram with frontal and lateral views of the head showed reocclusion of the right MCA at the proximal M1 segment. Under biplane roadmap, a zoom 71 aspiration catheter was navigated over an Aristotle 14 microguidewire into the right M1/MCA. The aspiration catheter was connected to a penumbra aspiration pump. The guiding catheter balloon was inflated. The aspiration catheter was removed under constant aspiration. Follow-up right ICA angiograms with frontal and lateral views of the head showed complete recanalization of the right MCA territory. Delay angiograms of the right common carotid artery showed patent recently deployed stent with no evidence of thromboembolic complication. Delayed angiograms of the head with frontal and lateral views showed no thromboembolic complication. Right common femoral artery angiograms were obtained with frontal and lateral views. The right common femoral artery has normal caliber. The femoral sheath was exchanged for an 8 [place] Angio-Seal which was utilized for access closure. Immediate hemostasis was achieved. 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 PLAN: 1. Patient will be started on dual anti-platelet therapy with Brilinta and aspirin after follow-up CT confirms no evidence of hemorrhagic complication. 2. Carotid  duplex within 3 months to evaluate for stent patency. Electronically Signed   By: Pedro Earls M.D.   On: 12/21/2020 16:56   IR US Guide Vasc Access Right  Result Date: 12/21/2020 INDICATION: 80 year old male with past medical history significant for aortic atherosclerosis, bilateral renal artery stenosis, chronic kidney disease, coronary artery, gastroesophageal reflux disease, hyperlipidemia, chronic orthostatic hypotension, and BPH. He initially presented to [facility] complaining of generalized weakness, tremors and vision changes. 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 During transferred to [facility] he was noted to have sudden change in his neurological exam with sudden onset of left-sided weakness. Repeat CT angiogram showed a new right MCA/M1 occlusion in addition to his cervical occlusion. Acute neuro changes noted at 17:40 on 12/19/2020, NIHSS 19 at presentation to [facility], modified Rankin scale 0. He was then transferred to our service for emergency cerebral angiogram and thrombectomy. EXAM: ULTRASOUND-GUIDED VASCULAR ACCESS DIAGNOSTIC CEREBRAL ANGIOGRAM MECHANICAL THROMBECTOMY FLAT PANEL HEAD CT COMPARISON:  CT/CT angiogram of the head and neck December 19, 2020. MEDICATIONS: Cangrelor bolus and drip, IV. ANESTHESIA/SEDATION: The procedure was performed under general anesthesia. CONTRAST:  75 mL of Omnipaque 240 milligrams/mL.  FLUOROSCOPY TIME:  Fluoroscopy Time: 30 minutes 24 seconds (941.1 mGy). COMPLICATIONS: None immediate. TECHNIQUE: Informed written consent was obtained from the patient's spouse after a thorough discussion of the procedural risks, benefits and alternatives. All questions were addressed. Maximal Sterile Barrier Technique was utilized including caps, mask, sterile gowns, sterile gloves, sterile drape, hand hygiene and skin antiseptic. A timeout was  performed prior to the initiation of the procedure. The right groin was prepped and draped in the usual sterile fashion. Using a micropuncture kit and the modified Seldinger technique, access was gained to the right common femoral artery and an 8 French sheath was placed. Real-time ultrasound guidance was utilized for vascular access including the acquisition of a permanent ultrasound image documenting patency of the accessed vessel. Under fluoroscopy, an 8 [place] Walrus balloon guide catheter was navigated over a 6 [place] Berenstein 2 catheter and a 0.035" Terumo Glidewire into the aortic arch. The catheter was placed into the right common carotid artery frontal and lateral angiograms of the neck were obtained. FINDINGS: Filling defect consistent with clot within the right common carotid artery, along the carotid bifurcation with contrast penetration into the cervical right ICA. Large filling defect within the bulb and proximal cervical right ICA and occlusive clot within the petrous segment of the right ICA. PROCEDURE: The Berenstein 2 catheter was then advanced over the wire into the right internal carotid artery. The walrus guide catheter was then advanced over the [facility] 2 catheter into the cervical right internal carotid artery. The Berenstein 2 and wire were removed. Manual aspiration performed until clear blood was seen. Large amount of clot retrieved. Right ICA angiograms were performed with frontal and lateral views of the upper neck and head. Recanalization of the cervical and intracranial right ICA noted as well as recanalization of the M1 segment. An occlusive clot within the distal right M2/MCA posterior division branch noted. Under biplane roadmap, a zoom 55 aspiration catheter was navigated over an Aristotle 14 microguidewire into the right M1/MCA. The aspiration catheter was then advanced to the level of occlusion and connected to a penumbra aspiration pump. The guiding catheter balloon was  inflated. The aspiration catheter was removed under constant aspiration. Follow-up right ICA angiograms with frontal and lateral views of the head showed complete recanalization of the right MCA territory. The guiding catheter was retracted into the right common carotid artery. Angioplasty at the right carotid bifurcation was performed with a 3 x 12 mm trek balloon. Right common carotid artery angiograms with frontal and lateral views of the neck showed improved caliber at the right carotid bifurcation, occlusion of the ECA and nonocclusive filling defect concerning for clot. Aspiration through the guiding catheter was performed. However, follow-up right common carotid artery angiogram showed progression of clot formation and near occlusion of the proximal right ICA. Flat panel CT of the head was obtained and post processed in a separate workstation with concurrent attending physician supervision. Selected images were sent to PACS. No evidence of hemorrhagic complication seen. Patient was loaded on cangrelor. Subsequently, a 6-8 x 40 mm XACT carotid stent was deployed across the area of stenosis. Follow-up angiogram showed resolution of the stenosis with prompt fall or flow with no evidence of residual filling defect. Right internal carotid artery angiogram with frontal and lateral views of the head showed reocclusion of the right MCA at the proximal M1 segment. Under biplane roadmap, a zoom 71 aspiration catheter was navigated over an Aristotle 14 microguidewire into the right M1/MCA. The aspiration catheter was  connected to a penumbra aspiration pump. The guiding catheter balloon was inflated. The aspiration catheter was removed under constant aspiration. Follow-up right ICA angiograms with frontal and lateral views of the head showed complete recanalization of the right MCA territory. Delay angiograms of the right common carotid artery showed patent recently deployed stent with no evidence of thromboembolic  complication. Delayed angiograms of the head with frontal and lateral views showed no thromboembolic complication. Right common femoral artery angiograms were obtained with frontal and lateral views. The right common femoral artery has normal caliber. The femoral sheath was exchanged for an 8 [place] Angio-Seal which was utilized for access closure. Immediate hemostasis was achieved. 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 PLAN: 1. Patient will be started on dual anti-platelet therapy with Brilinta and aspirin after follow-up CT confirms no evidence of hemorrhagic complication. 2. Carotid duplex within 3 months to evaluate for stent patency. Electronically Signed   By: Pedro Earls M.D.   On: 12/21/2020 16:56   CT CEREBRAL PERFUSION W CONTRAST  Result Date: 01/06/2021 CLINICAL DATA:  Initial evaluation for acute stroke, left-sided weakness, now resolved. EXAM: CT ANGIOGRAPHY HEAD AND NECK CT PERFUSION BRAIN TECHNIQUE: Multidetector CT imaging of the head and neck was performed using the standard protocol during bolus administration of intravenous contrast. Multiplanar CT image reconstructions and MIPs were obtained to evaluate the vascular anatomy. Carotid stenosis measurements (when applicable) are obtained utilizing NASCET criteria, using the distal internal carotid diameter as the denominator. Multiphase CT imaging of the brain was performed following IV bolus contrast injection. Subsequent parametric perfusion maps were calculated using RAPID software. CONTRAST:  150mL OMNIPAQUE IOHEXOL 350 MG/ML SOLN COMPARISON:  Prior CT from earlier the same day as well as previous studies from 12/20/2020 and 12/19/2020. FINDINGS: CTA NECK FINDINGS Aortic arch: Visualized aortic arch normal in caliber with normal 3  vessel morphology. Moderate atheromatous change about the arch and origin of the great vessels without high-grade stenosis. Right carotid system: Right CCA is now widely patent from its origin to the bifurcation. Intravascular stent traverses the right bifurcation extending into the proximal right ICA. Moderate stenosis of the stent at the level of the native bifurcation of up to approximately 50%. Stent is otherwise widely patent without intraluminal thrombus or other complication. Right ICA patent distally to the skull base without stenosis, dissection or occlusion. Left carotid system: Left CCA tortuous proximally but is widely patent to the bifurcation without stenosis. Mild calcified plaque about the left bifurcation/proximal left ICA without significant stenosis. Left ICA patent distally to the skull base without stenosis, dissection or occlusion. Vertebral arteries: Both vertebral arteries arise from the subclavian arteries. Calcified plaque at the origin of the right subclavian artery with associated mild stenosis again noted. Atheromatous plaque at the origins of both vertebral arteries M cells with associated mild stenosis noted as well, also stable. Left vertebral artery slightly dominant. Vertebral arteries otherwise remain widely patent within the neck without stenosis, dissection or occlusion. Skeleton: No acute osseous finding. No discrete or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     3.80 cm  RIGHT VENTRICLE RV S prime:     7.72 cm/s TAPSE (M-mode): 1.3 cm LEFT ATRIUM             Index       RIGHT ATRIUM           Index LA diam:        3.90 cm 1.91 cm/m  RA Area:     13.50 cm LA Vol (A2C):   68.4 ml 33.48 ml/m RA Volume:   26.90 ml  13.17 ml/m LA Vol (A4C):   49.4 ml 24.18 ml/m LA Biplane Vol: 61.6 ml 30.16 ml/m  AORTIC VALVE AV Area (Vmax):    1.37 cm AV Area (Vmean):   1.27 cm AV Area (VTI):     1.42 cm AV Vmax:           282.00 cm/s AV Vmean:          220.000 cm/s AV VTI:            0.554 m AV Peak Grad:      31.8 mmHg AV Mean Grad:      21.0 mmHg LVOT Vmax:         102.00 cm/s LVOT Vmean:        73.700 cm/s LVOT VTI:          0.207 m LVOT/AV VTI ratio: 0.37  AORTA Ao Root diam: 3.30 cm MITRAL VALVE MV Area (PHT): 2.69 cm     SHUNTS MV Decel Time: 282 msec     Systemic VTI:  0.21 m MV E velocity: 68.30 cm/s   Systemic Diam: 2.20 cm MV A velocity: 103.00 cm/s MV E/A ratio:  0.66 Buford Dresser MD Electronically signed by Buford Dresser MD Signature Date/Time: 12/20/2020/7:49:25 PM    Final    IR PERCUTANEOUS ART THROMBECTOMY/INFUSION INTRACRANIAL INC DIAG ANGIO  Result Date: 12/21/2020 INDICATION: 80 year old male with past medical history significant for aortic atherosclerosis, bilateral renal artery stenosis, chronic kidney disease, coronary artery, gastroesophageal reflux disease, hyperlipidemia, chronic orthostatic hypotension, and BPH. He initially presented to [facility] complaining of generalized weakness, tremors and vision changes. 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 During transferred to [facility] he was noted to have sudden change in his neurological exam with sudden onset of left-sided weakness. Repeat CT angiogram showed a new right MCA/M1 occlusion in addition to his cervical occlusion. Acute neuro changes noted at 17:40 on 12/19/2020, NIHSS 19 at presentation to [facility], modified Rankin scale 0.  He was then transferred to our service for emergency cerebral angiogram and thrombectomy. EXAM: ULTRASOUND-GUIDED VASCULAR ACCESS DIAGNOSTIC CEREBRAL ANGIOGRAM MECHANICAL THROMBECTOMY FLAT PANEL HEAD CT COMPARISON:  CT/CT angiogram of the head and neck December 19, 2020. MEDICATIONS: Cangrelor bolus and drip, IV. ANESTHESIA/SEDATION: The procedure was performed under general anesthesia. CONTRAST:  75 mL of Omnipaque 240 milligrams/mL. FLUOROSCOPY TIME:  Fluoroscopy Time: 30 minutes 24 seconds (941.1 mGy). COMPLICATIONS: None immediate. TECHNIQUE: Informed written consent was obtained from the patient's spouse after a thorough discussion of the procedural risks, benefits and alternatives. All questions were addressed. Maximal Sterile Barrier Technique was utilized including caps, mask, sterile gowns, sterile gloves, sterile drape, hand hygiene and skin antiseptic. A timeout was performed prior to the initiation of the procedure. The right groin was prepped and draped in the usual sterile fashion. Using a micropuncture kit and the modified Seldinger technique, access was gained to the right common femoral artery and an 8 French sheath was placed. Real-time ultrasound guidance was utilized for vascular access including the acquisition of a permanent ultrasound image documenting patency of the accessed vessel. Under fluoroscopy, an 8 [place] Walrus balloon guide catheter was navigated over a 6 [place] Berenstein 2 catheter and a 0.035" Terumo Glidewire into the aortic arch. The catheter was placed into the right common carotid artery frontal and lateral  angiograms of the neck were obtained. FINDINGS: Filling defect consistent with clot within the right common carotid artery, along the carotid bifurcation with contrast penetration into the cervical right ICA. Large filling defect within the bulb and proximal cervical right ICA and occlusive clot within the petrous segment of the right ICA. PROCEDURE: The Berenstein 2 catheter was then advanced over the wire into the right internal carotid artery. The walrus guide catheter was then advanced over the [facility] 2 catheter into the cervical right internal carotid artery. The Berenstein 2 and wire were removed. Manual aspiration performed until clear blood was seen. Large amount of clot retrieved. Right ICA angiograms were performed with frontal and lateral views of the upper neck and head. Recanalization of the cervical and intracranial right ICA noted as well as recanalization of the M1 segment. An occlusive clot within the distal right M2/MCA posterior division branch noted. Under biplane roadmap, a zoom 55 aspiration catheter was navigated over an Aristotle 14 microguidewire into the right M1/MCA. The aspiration catheter was then advanced to the level of occlusion and connected to a penumbra aspiration pump. The guiding catheter balloon was inflated. The aspiration catheter was removed under constant aspiration. Follow-up right ICA angiograms with frontal and lateral views of the head showed complete recanalization of the right MCA territory. The guiding catheter was retracted into the right common carotid artery. Angioplasty at the right carotid bifurcation was performed with a 3 x 12 mm trek balloon. Right common carotid artery angiograms with frontal and lateral views of the neck showed improved caliber at the right carotid bifurcation, occlusion of the ECA and nonocclusive filling defect concerning for clot. Aspiration through the guiding catheter was performed. However, follow-up right common carotid artery  angiogram showed progression of clot formation and near occlusion of the proximal right ICA. Flat panel CT of the head was obtained and post processed in a separate workstation with concurrent attending physician supervision. Selected images were sent to PACS. No evidence of hemorrhagic complication seen. Patient was loaded on cangrelor. Subsequently, a 6-8 x 40 mm XACT carotid stent was deployed across the area of stenosis. Follow-up angiogram showed resolution of the  stenosis with prompt fall or flow with no evidence of residual filling defect. Right internal carotid artery angiogram with frontal and lateral views of the head showed reocclusion of the right MCA at the proximal M1 segment. Under biplane roadmap, a zoom 71 aspiration catheter was navigated over an Aristotle 14 microguidewire into the right M1/MCA. The aspiration catheter was connected to a penumbra aspiration pump. The guiding catheter balloon was inflated. The aspiration catheter was removed under constant aspiration. Follow-up right ICA angiograms with frontal and lateral views of the head showed complete recanalization of the right MCA territory. Delay angiograms of the right common carotid artery showed patent recently deployed stent with no evidence of thromboembolic complication. Delayed angiograms of the head with frontal and lateral views showed no thromboembolic complication. Right common femoral artery angiograms were obtained with frontal and lateral views. The right common femoral artery has normal caliber. The femoral sheath was exchanged for an 8 [place] Angio-Seal which was utilized for access closure. Immediate hemostasis was achieved. 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 PLAN: 1. Patient will be started on dual anti-platelet therapy with  Brilinta and aspirin after follow-up CT confirms no evidence of hemorrhagic complication. 2. Carotid duplex within 3 months to evaluate for stent patency. Electronically Signed   By: Pedro Earls M.D.   On: 12/21/2020 16:56   CT HEAD CODE STROKE WO CONTRAST  Result Date: 01/06/2021 CLINICAL DATA:  Code stroke. Initial evaluation for acute left-sided weakness EXAM: CT HEAD WITHOUT CONTRAST TECHNIQUE: Contiguous axial images were obtained from the base of the skull through the vertex without intravenous contrast. COMPARISON:  Prior CT and MRI from 12/20/2020. FINDINGS: Brain: Cerebral volume within normal limits for age. No acute intracranial hemorrhage. There is a new 1 cm hypodensity at the right lentiform nucleus/putamen, consistent with a small ischemic infarct, acute to subacute in appearance. This is new as compared to previous MRI and CT from 12/20/2020. No other evidence for acute large vessel territory infarct. No mass lesion, midline shift or mass effect. No hydrocephalus or extra-axial fluid collection. 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 Vascular: No visible hyperdense vessel. Skull: Scalp soft tissues and calvarium within normal limits. Sinuses/Orbits: Globes and orbital soft tissues demonstrate no acute finding. Paranasal sinuses are largely clear. No mastoid effusion. Other: None. ASPECTS ([place] Stroke Program Early CT Score) - Ganglionic level infarction (caudate, lentiform nuclei, internal capsule, insula, M1-M3 cortex): 6 - Supraganglionic infarction (M4-M6 cortex): 3 Total score (0-10 with 10 being normal): 9 IMPRESSION: 1. 1 cm hypodensity at the right putamen, somewhat age indeterminate, but could reflect an acute to subacute ischemic infarct. This is new as compared to recent MRI and CT from 12/20/2020. No intracranial hemorrhage. 2. ASPECTS is 9. Critical Value/emergent results were called by telephone at the time of interpretation on 01/06/2021 at  3:24 am to provider [facility] , who verbally acknowledged these results. Electronically Signed   By: Jeannine Boga M.D.   On: 01/06/2021 03:29   CT HEAD CODE STROKE WO CONTRAST  Result Date: 12/19/2020 CLINICAL DATA:  Code stroke.  Left-sided facial droop. EXAM: CT HEAD WITHOUT CONTRAST TECHNIQUE: Contiguous axial images were obtained from the base of the skull through the vertex without intravenous contrast. COMPARISON:  Head CT, CTA, and MRI 12/19/2020 FINDINGS: Brain: No acute large territory infarct, intracranial hemorrhage, mass, midline shift,  or extra-axial fluid collection is identified. The 2 subcentimeter acute right MCA cortical infarcts on MRI are not well shown by CT. There is mild cerebral atrophy. Hypodensities in the cerebral white matter bilaterally are unchanged and nonspecific but compatible with mild chronic small vessel ischemic disease. Small chronic infarcts are again noted in the right frontal lobe and left occipital lobe. Vascular: Limited assessment due to residual intravascular contrast from today's earlier CTA. Skull: No fracture or suspicious osseous lesion. Sinuses/Orbits: Small mucous retention cyst in the right frontal sinus. Small right mastoid effusion. Bilateral cataract extraction. Other: None. ASPECTS [facility] Stroke Program Early CT Score) - Ganglionic level infarction (caudate, lentiform nuclei, internal capsule, insula, M1-M3 cortex): 7 - Supraganglionic infarction (M4-M6 cortex): 3 Total score (0-10 with 10 being normal): 10 IMPRESSION: 1. No evidence of acute large territory infarct or intracranial hemorrhage. Subcentimeter acute right MCA infarcts on MRI are occult by CT. 2. ASPECTS is 10. These results were communicated to Dr. Lorrin Goodell at 6:24 pm on 12/19/2020 by text page via the [facility] messaging system. Electronically Signed   By: Logan Bores M.D.   On: 12/19/2020 18:24   CT ANGIO HEAD CODE STROKE  Result Date: 12/19/2020 CLINICAL DATA:  Left facial  droop. EXAM: CT ANGIOGRAPHY HEAD AND NECK TECHNIQUE: Multidetector CT imaging of the head and neck was performed using the standard protocol during bolus administration of intravenous contrast. Multiplanar CT image reconstructions and MIPs were obtained to evaluate the vascular anatomy. Carotid stenosis measurements (when applicable) are obtained utilizing NASCET criteria, using the distal internal carotid diameter as the denominator. CONTRAST:  45mL OMNIPAQUE IOHEXOL 350 MG/ML SOLN COMPARISON:  CTA head and neck earlier today FINDINGS: CTA NECK FINDINGS Aortic arch: Standard 3 vessel aortic arch with mild-to-moderate calcified plaque. No evidence of significant arch vessel origin stenosis. 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 Left carotid system: Patent with mild calcified plaque in the mid common carotid artery and at the carotid bifurcation. No evidence of significant stenosis or dissection. Vertebral arteries: Calcified plaque results in unchanged mild stenosis of the proximal right subclavian artery. The vertebral arteries remain patent with unchanged mild stenosis of both origins due to calcified plaque. The left vertebral artery is mildly dominant. Skeleton: Moderate cervical disc and facet degeneration. Other neck: No evidence of cervical lymphadenopathy or mass. Post radiation changes in the neck. Upper chest: Scarring or post radiation fibrosis in the lung apices. 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 There is an early bifurcation of the right MCA with a new filling defect at the bifurcation consistent with an embolus. The origin of the right M2 superior division is narrowed but remains patent. The right M2  inferior division is occluded over approximately 6 mm long segment beginning at the MCA bifurcation with subsequent reconstitution. There is also occlusion of some small right MCA branches more distally compared to today's earlier CTA. The left MCA and both ACAs remain patent with branch vessel irregularity but no evidence of a significant proximal stenosis. No aneurysm is identified. Posterior circulation: The intracranial vertebral arteries are patent to the basilar with atherosclerosis resulting in mild luminal irregularity and at most mild narrowing bilaterally. Patent PICA and SCA origins are identified bilaterally. The basilar artery is widely patent. There is  a small right posterior communicating artery. Both PCAs are patent with mild atherosclerotic irregularity but no evidence of a significant proximal stenosis. No aneurysm is identified. Venous sinuses: Patent. Anatomic variants: None. Review of the MIP images confirms the above findings IMPRESSION: 1. New embolus at the right MCA bifurcation with short segment occlusion of the M2 inferior division, narrowing of the origin of the M2 superior division, and occlusion of some more distal MCA branch vessels. 2. 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 3. Mild bilateral vertebral artery origin stenoses. 4. Left-sided carotid atherosclerosis without significant stenosis. 5.  Aortic Atherosclerosis (ICD10-I70.0). These results were communicated to Dr. Lorrin Goodell at 6:24 pm on 12/19/2020 by text page via the [facility] messaging system. Electronically Signed   By: Logan Bores M.D.   On: 12/19/2020 18:38   CT ANGIO NECK CODE STROKE  Result Date: 12/19/2020 CLINICAL DATA:  Left facial droop. EXAM: CT ANGIOGRAPHY HEAD AND NECK TECHNIQUE: Multidetector CT imaging of the head and neck was performed using the standard protocol during bolus administration of intravenous  contrast. Multiplanar CT image reconstructions and MIPs were obtained to evaluate the vascular anatomy. Carotid stenosis measurements (when applicable) are obtained utilizing NASCET criteria, using the distal internal carotid diameter as the denominator. CONTRAST:  36mL OMNIPAQUE IOHEXOL 350 MG/ML SOLN COMPARISON:  CTA head and neck earlier today FINDINGS: CTA NECK FINDINGS Aortic arch: Standard 3 vessel aortic arch with mild-to-moderate calcified plaque. No evidence of significant arch vessel origin stenosis. 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 Left carotid system: Patent with mild calcified plaque in the mid common carotid artery and at the carotid bifurcation. No evidence of significant stenosis or dissection. Vertebral arteries: Calcified plaque results in unchanged mild stenosis of the proximal right subclavian artery. The vertebral arteries remain patent with unchanged mild stenosis of both origins due to calcified plaque. The left vertebral artery is mildly dominant. Skeleton: Moderate cervical disc and facet degeneration. Other neck: No evidence of cervical lymphadenopathy or mass. Post radiation changes in the neck. Upper chest: Scarring or post radiation fibrosis in the lung apices. 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 There is an early bifurcation of the right MCA with a new filling defect at the bifurcation consistent with an embolus. The origin of the right M2 superior division is narrowed but remains patent. The right M2 inferior division is occluded over approximately 6 mm long segment beginning at the MCA bifurcation with subsequent reconstitution. There is also occlusion of some small right MCA branches  more distally compared to today's earlier CTA. The left MCA and both ACAs remain patent with branch vessel irregularity but no evidence of a significant proximal stenosis. No aneurysm is identified. Posterior circulation: The intracranial vertebral arteries are patent to the basilar with atherosclerosis resulting in mild luminal irregularity and at most mild narrowing bilaterally. Patent PICA and SCA origins are identified bilaterally. The basilar artery is widely patent. There is a small right posterior communicating artery. Both PCAs are patent with mild atherosclerotic irregularity but no evidence of a significant proximal stenosis. No aneurysm is identified. Venous sinuses: Patent. Anatomic variants: None. Review of the MIP images confirms the above findings IMPRESSION: 1.  New embolus at the right MCA bifurcation with short segment occlusion of the M2 inferior division, narrowing of the origin of the M2 superior division, and occlusion of some more distal MCA branch vessels. 2. 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 3. Mild bilateral vertebral artery origin stenoses. 4. Left-sided carotid atherosclerosis without significant stenosis. 5.  Aortic Atherosclerosis (ICD10-I70.0). These results were communicated to Dr. Derry Lory at 6:24 pm on 12/19/2020 by text page via the [facility] messaging system. Electronically Signed   By: Sebastian Ache M.D.   On: 12/19/2020 18:38     Subjective: Patient is feeling better, no nausea or vomiting, no dyspnea or chest pain.   Discharge Exam: Vitals:   01/07/21 0512 01/07/21 0742  BP: 119/70 133/80  Pulse:  75  Resp:  20  Temp:  98.1 F (36.7 C)  SpO2:  96%   Vitals:   01/07/21 0425 01/07/21 0436 01/07/21 0512 01/07/21 0742  BP: (!) 103/53 96/69 119/70 133/80  Pulse: 74 81  75  Resp: 19   20  Temp: 98 F (36.7 C)   98.1 F (36.7 C)  TempSrc: Oral   Oral  SpO2:  93%   96%  Height:        General: Not in pain or dyspnea.  Neurology: Awake and alert, non focal  E ENT: mild pallor, no icterus, oral mucosa moist Cardiovascular: No JVD. S1-S2 present, rhythmic, no gallops, rubs, or murmurs. No lower extremity edema. Pulmonary: positive breath sounds bilaterally, adequate air movement, no wheezing, rhonchi or rales. Gastrointestinal. Abdomen soft and non tender Skin. No rashes Musculoskeletal: no joint deformities   The results of significant diagnostics from this hospitalization (including imaging, microbiology, ancillary and laboratory) are listed below for reference.     Microbiology: Recent Results (from the past 240 hour(s))  Resp Panel by RT-PCR (Flu A&B, Covid) Nasopharyngeal Swab     Status: None   Collection Time: 01/06/21  3:03 AM   Specimen: Nasopharyngeal Swab; Nasopharyngeal(NP) swabs in vial transport medium  Result Value Ref Range Status   SARS Coronavirus 2 by RT PCR NEGATIVE NEGATIVE Final    Comment: (NOTE) SARS-CoV-2 target nucleic acids are NOT DETECTED.  The SARS-CoV-2 RNA is generally detectable in upper respiratory specimens during the acute phase of infection. The lowest concentration of SARS-CoV-2 viral copies this assay can detect is 138 copies/mL. A negative result does not preclude SARS-Cov-2 infection and should not be used as the sole basis for treatment or other patient management decisions. A negative result may occur with  improper specimen collection/handling, submission of specimen other than nasopharyngeal swab, presence of viral mutation(s) within the areas targeted by this assay, and inadequate number of viral copies(<138 copies/mL). A negative result must be combined with clinical observations, patient history, and epidemiological information. The expected result is Negative.  Fact Sheet for Patients:  BloggerCourse.com  Fact Sheet for Healthcare Providers:   SeriousBroker.it  This test is no t yet approved or cleared by the [place] FDA and  has been authorized for detection and/or diagnosis of SARS-CoV-2 by FDA under an Emergency Use Authorization (EUA). This EUA will remain  in effect (meaning this test can be used) for the duration of the COVID-19 declaration under Section 564(b)(1) of the Act, 21 U.S.C.section 360bbb-3(b)(1), unless the authorization is terminated  or revoked sooner.       Influenza A by PCR NEGATIVE NEGATIVE Final  Influenza B by PCR NEGATIVE NEGATIVE Final    Comment: (NOTE) The Xpert Xpress SARS-CoV-2/FLU/RSV plus assay is intended as an aid in the diagnosis of influenza from Nasopharyngeal swab specimens and should not be used as a sole basis for treatment. Nasal washings and aspirates are unacceptable for Xpert Xpress SARS-CoV-2/FLU/RSV testing.  Fact Sheet for Patients: EntrepreneurPulse.com.au  Fact Sheet for Healthcare Providers: IncredibleEmployment.be  This test is not yet approved or cleared by the [place] FDA and has been authorized for detection and/or diagnosis of SARS-CoV-2 by FDA under an Emergency Use Authorization (EUA). This EUA will remain in effect (meaning this test can be used) for the duration of the COVID-19 declaration under Section 564(b)(1) of the Act, 21 U.S.C. section 360bbb-3(b)(1), unless the authorization is terminated or revoked.  Performed at [facility], [address] [place],  [address]      Labs: BNP (last 3 results) Recent Labs    06/21/20 0744  BNP 82.9   Basic Metabolic Panel: Recent Labs  Lab 01/06/21 0324 01/07/21 0454  NA 139 139  K 4.3 3.9  CL 104 103  CO2 26 30  GLUCOSE 168* 109*  BUN 24* 19  CREATININE 1.35* 1.27*  CALCIUM 8.9 9.3   Liver Function Tests: Recent Labs  Lab 01/06/21 0324 01/07/21 0454  AST 34 37  ALT 35 32  ALKPHOS 37* 37*  BILITOT 0.3 0.3   PROT 7.6 7.0  ALBUMIN 3.7 3.3*   No results for input(s): LIPASE, AMYLASE in the last 168 hours. No results for input(s): AMMONIA in the last 168 hours. CBC: Recent Labs  Lab 01/06/21 0324 01/07/21 0454  WBC 14.5* 10.1  NEUTROABS 12.6*  --   HGB 13.0 13.5  HCT 40.5 41.3  MCV 101.3* 100.0  PLT 222 252   Cardiac Enzymes: No results for input(s): CKTOTAL, CKMB, CKMBINDEX, TROPONINI in the last 168 hours. BNP: Invalid input(s): POCBNP CBG: Recent Labs  Lab 01/06/21 0807  GLUCAP 97   D-Dimer No results for input(s): DDIMER in the last 72 hours. Hgb A1c No results for input(s): HGBA1C in the last 72 hours. Lipid Profile No results for input(s): CHOL, HDL, LDLCALC, TRIG, CHOLHDL, LDLDIRECT in the last 72 hours. Thyroid function studies No results for input(s): TSH, T4TOTAL, T3FREE, THYROIDAB in the last 72 hours.  Invalid input(s): FREET3 Anemia work up No results for input(s): VITAMINB12, FOLATE, FERRITIN, TIBC, IRON, RETICCTPCT in the last 72 hours. Urinalysis    Component Value Date/Time   COLORURINE STRAW (A) 01/06/2021 0515   APPEARANCEUR CLEAR 01/06/2021 0515   LABSPEC 1.028 01/06/2021 0515   LABSPEC 1.005 06/25/2011 1502   PHURINE 7.0 01/06/2021 0515   GLUCOSEU NEGATIVE 01/06/2021 0515   HGBUR NEGATIVE 01/06/2021 0515   BILIRUBINUR NEGATIVE 01/06/2021 0515   BILIRUBINUR Negative 06/25/2011 1502   KETONESUR NEGATIVE 01/06/2021 0515   PROTEINUR NEGATIVE 01/06/2021 0515   UROBILINOGEN 0.2 10/09/2011 1634   NITRITE NEGATIVE 01/06/2021 0515   LEUKOCYTESUR NEGATIVE 01/06/2021 0515   LEUKOCYTESUR Negative 06/25/2011 1502   Sepsis Labs Invalid input(s): PROCALCITONIN,  WBC,  LACTICIDVEN Microbiology Recent Results (from the past 240 hour(s))  Resp Panel by RT-PCR (Flu A&B, Covid) Nasopharyngeal Swab     Status: None   Collection Time: 01/06/21  3:03 AM   Specimen: Nasopharyngeal Swab; Nasopharyngeal(NP) swabs in vial transport medium  Result Value Ref Range  Status   SARS Coronavirus 2 by RT PCR NEGATIVE NEGATIVE Final    Comment: (NOTE) SARS-CoV-2 target nucleic acids are NOT  DETECTED.  The SARS-CoV-2 RNA is generally detectable in upper respiratory specimens during the acute phase of infection. The lowest concentration of SARS-CoV-2 viral copies this assay can detect is 138 copies/mL. A negative result does not preclude SARS-Cov-2 infection and should not be used as the sole basis for treatment or other patient management decisions. A negative result may occur with  improper specimen collection/handling, submission of specimen other than nasopharyngeal swab, presence of viral mutation(s) within the areas targeted by this assay, and inadequate number of viral copies(<138 copies/mL). A negative result must be combined with clinical observations, patient history, and epidemiological information. The expected result is Negative.  Fact Sheet for Patients:  EntrepreneurPulse.com.au  Fact Sheet for Healthcare Providers:  IncredibleEmployment.be  This test is no t yet approved or cleared by the [place] FDA and  has been authorized for detection and/or diagnosis of SARS-CoV-2 by FDA under an Emergency Use Authorization (EUA). This EUA will remain  in effect (meaning this test can be used) for the duration of the COVID-19 declaration under Section 564(b)(1) of the Act, 21 U.S.C.section 360bbb-3(b)(1), unless the authorization is terminated  or revoked sooner.       Influenza A by PCR NEGATIVE NEGATIVE Final   Influenza B by PCR NEGATIVE NEGATIVE Final    Comment: (NOTE) The Xpert Xpress SARS-CoV-2/FLU/RSV plus assay is intended as an aid in the diagnosis of influenza from Nasopharyngeal swab specimens and should not be used as a sole basis for treatment. Nasal washings and aspirates are unacceptable for Xpert Xpress SARS-CoV-2/FLU/RSV testing.  Fact Sheet for  Patients: EntrepreneurPulse.com.au  Fact Sheet for Healthcare Providers: IncredibleEmployment.be  This test is not yet approved or cleared by the [place] FDA and has been authorized for detection and/or diagnosis of SARS-CoV-2 by FDA under an Emergency Use Authorization (EUA). This EUA will remain in effect (meaning this test can be used) for the duration of the COVID-19 declaration under Section 564(b)(1) of the Act, 21 U.S.C. section 360bbb-3(b)(1), unless the authorization is terminated or revoked.  Performed at [facility], [address] [place], [place] [address]      Time coordinating discharge: 45 minutes  SIGNED:   Tawni Millers, MD  [facility] 01/07/2021, 10:52 AM

## 2021-01-07 NOTE — TOC Transition Note (Signed)
Transition of Care [facility]) - CM/SW Discharge Note   Patient Details  Name: George Bradley MRN: [id number] Date of Birth: 1940-10-16  [facility] CM/SW Contact:  Pollie Friar, RN Phone Number: 01/07/2021, 11:20 AM   Clinical Narrative:    Patient discharging home with self care. No f/u per PT.  Pt has transportation home and supervision at home.   Final next level of care: Home/Self Care Barriers to Discharge: No Barriers Identified   Patient Goals and CMS Choice        Discharge Placement                       Discharge Plan and Services                                     Social Determinants of Health (SDOH) Interventions     Readmission Risk Interventions No flowsheet data found.

## 2021-01-07 NOTE — Progress Notes (Signed)
BP noted to be 96/69, asymptomatic. Dr. Marlowe Sax updated.

## 2021-01-07 NOTE — Progress Notes (Signed)
Physical Therapy Treatment Patient Details Name: George Bradley MRN: [id number] DOB: 11-04-1940 Today's Date: 01/07/2021    History of Present Illness Patient is a 80 y/o male who presents on 01/06/21 with left sided weakness and AMS. Head CT- new right putamen infarct. 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 PMH includes bil renal artery stenosis, CKD, CAD, chronic orthostatic, tongue cancer with chronic dysphagia.    PT Comments    Pt fully participated in session. Pt agreeable to use AD if needed at home. Pt and wife verbalize understanding of all recommendations. Wife states she is always with pt. Pt continues to have some deficits in gait, balance and safety. Will follow acutely, not need for f/;u therapy following discharge.   Follow Up Recommendations        Equipment Recommendations  None recommended by PT    Recommendations for Other Services       Precautions / Restrictions Precautions Precautions: Fall;Other (comment) Precaution Comments: orthostatic Restrictions Weight Bearing Restrictions: No    Mobility  Bed Mobility Overal bed mobility: Modified Independent Bed Mobility: Supine to Sit     Supine to sit: Supervision;HOB elevated     General bed mobility comments: No assist needed.    Transfers Overall transfer level: Needs assistance Equipment used: None Transfers: Sit to/from Stand Sit to Stand: Supervision         General transfer comment: close supervision for safety  Ambulation/Gait Ambulation/Gait assistance: Min guard;Supervision Gait Distance (Feet): 300 Feet Assistive device: None           Stairs Stairs: Yes Stairs assistance: Min guard Stair Management: One rail Left;Step to pattern Number of Stairs: 6 General stair comments: performed step to. increased educationto pt and wife to slow down  to improve safety. Pt states R LE is stronger   Wheelchair Mobility    Modified Rankin (Stroke Patients Only) Modified Rankin (Stroke Patients Only) Pre-Morbid Rankin Score: Slight disability Modified Rankin: Moderately severe disability     Balance Overall balance assessment: Mild deficits observed, not formally tested Sitting-balance support: Feet supported;No upper extremity supported Sitting balance-Leahy Scale: Good     Standing balance support: During functional activity;No upper extremity supported Standing balance-Leahy Scale: Fair Standing balance comment: close supervision dynamically                            Cognition Arousal/Alertness: Awake/alert Behavior During Therapy: WFL for tasks assessed/performed Overall Cognitive Status: History of cognitive impairments - at baseline                                 General Comments: pt with history of cognitive deficits, decreased safety and memory. wife present durin session and states he is at baseline      Exercises      General Comments General comments (skin integrity, edema, etc.): increased time spent educating pt on standing for a minute prior ot ambulating due to pt becoming dizzy following standing. Education to use rollator initially for balance and to allow place to sit if pt become dizzy. pt and wife verbalize understadning. education and demonstration regarding balance activities in corner to increase safety, both wife and pt verbalize understanding      Pertinent Vitals/Pain Pain Assessment: No/denies pain  Home Living Family/patient expects to be discharged to:: Private residence Living Arrangements: Spouse/significant other Available Help at Discharge: Family;Available 24 hours/day Type of Home: House Home Access: Stairs to enter Entrance Stairs-Rails: Right Home Layout: Two level;Able to live on main level with bedroom/bathroom Home Equipment: Shower seat - built  in;Hand held Tourist information centre manager - 2 wheels;Cane - single point;Cane - quad;Wheelchair - manual;Walker - 4 wheels;Grab bars - toilet;Grab bars - tub/shower Additional Comments: Wife is a retired acute PT    Prior Function Level of Independence: Independent      Comments: Performing ADLs, light IADLs- riding lawn mower, and driving. (Reports spouse is trying to convince him not to drive.)   PT Goals (current goals can now be found in the care plan section) Acute Rehab PT Goals Patient Stated Goal: go home PT Goal Formulation: With patient/family Time For Goal Achievement: 01/20/21 Potential to Achieve Goals: Good Additional Goals Additional Goal #1: Pt will score >19/24 on DGI indicating decreased risk for falls. Progress towards PT goals: Progressing toward goals    Frequency    Min 4X/week      PT Plan Current plan remains appropriate    Co-evaluation              AM-PAC PT "6 Clicks" Mobility   Outcome Measure  Help needed turning from your back to your side while in a flat bed without using bedrails?: None Help needed moving from lying on your back to sitting on the side of a flat bed without using bedrails?: None Help needed moving to and from a bed to a chair (including a wheelchair)?: A Little Help needed standing up from a chair using your arms (e.g., wheelchair or bedside chair)?: A Little Help needed to walk in hospital room?: A Little Help needed climbing 3-5 steps with a railing? : A Little 6 Click Score: 20    End of Session Equipment Utilized During Treatment: Gait belt Activity Tolerance: Patient tolerated treatment well Patient left: with call bell/phone within reach;with chair alarm set;with family/visitor present Nurse Communication: Mobility status PT Visit Diagnosis: Unsteadiness on feet (R26.81);Other abnormalities of gait and mobility (R26.89);Difficulty in walking, not elsewhere classified (R26.2);Other symptoms and signs involving the nervous  system (R29.898)     Time: 0315-9458 PT Time Calculation (min) (ACUTE ONLY): 29 min  Charges:  $Gait Training: 8-22 mins $Therapeutic Activity: 8-22 mins                     Lyanne Co, DPT Acute Rehabilitation Services [phone]   [place] 01/07/2021, 11:58 AM

## 2021-01-07 NOTE — Care Management Obs Status (Signed)
[place] NOTIFICATION   Patient Details  Name: George Bradley MRN: [id number] Date of Birth: [birth year]   Medicare Observation Status Notification Given:  Yes    Pollie Friar, RN 01/07/2021, 10:46 AM

## 2021-01-07 NOTE — Progress Notes (Addendum)
STROKE TEAM PROGRESS NOTE   SUBJECTIVE (INTERVAL HISTORY) His wife is at the bedside.  Overall his condition is stable. No acute event overnight. Pt sitting in bed stated that he had good sleep last night. Wife reported OSA symptoms at home. Pt declined hospital bed at home. Will recommend home BP monitoring and follow up with Dr. Martinique for orthostatic hypotension and Dr. Virgina Jock for OSA management.    OBJECTIVE Temp:  [98 F (36.7 C)-98.3 F (36.8 C)] 98.1 F (36.7 C) (04/18 1125) Pulse Rate:  [74-84] 75 (04/18 0742) Resp:  [16-20] 20 (04/18 0742) BP: (96-178)/(53-80) 133/80 (04/18 0742) SpO2:  [93 %-98 %] 98 % (04/18 1125)  Recent Labs  Lab 01/06/21 0807  GLUCAP 97   Recent Labs  Lab 01/06/21 0324 01/07/21 0454  NA 139 139  K 4.3 3.9  CL 104 103  CO2 26 30  GLUCOSE 168* 109*  BUN 24* 19  CREATININE 1.35* 1.27*  CALCIUM 8.9 9.3   Recent Labs  Lab 01/06/21 0324 01/07/21 0454  AST 34 37  ALT 35 32  ALKPHOS 37* 37*  BILITOT 0.3 0.3  PROT 7.6 7.0  ALBUMIN 3.7 3.3*   Recent Labs  Lab 01/06/21 0324 01/07/21 0454  WBC 14.5* 10.1  NEUTROABS 12.6*  --   HGB 13.0 13.5  HCT 40.5 41.3  MCV 101.3* 100.0  PLT 222 252   No results for input(s): CKTOTAL, CKMB, CKMBINDEX, TROPONINI in the last 168 hours. Recent Labs    01/06/21 0324  LABPROT 12.8  INR 1.0   Recent Labs    01/06/21 0515  COLORURINE STRAW*  LABSPEC 1.028  PHURINE 7.0  GLUCOSEU NEGATIVE  HGBUR NEGATIVE  BILIRUBINUR NEGATIVE  KETONESUR NEGATIVE  PROTEINUR NEGATIVE  NITRITE NEGATIVE  LEUKOCYTESUR NEGATIVE       Component Value Date/Time   CHOL 113 12/20/2020 0240   CHOL 91 (L) 05/20/2018 0820   TRIG 106 12/20/2020 0240   HDL 62 12/20/2020 0240   HDL 55 05/20/2018 0820   CHOLHDL 1.8 12/20/2020 0240   VLDL 21 12/20/2020 0240   LDLCALC 30 12/20/2020 0240   LDLCALC 23 05/20/2018 0820   Lab Results  Component Value Date   HGBA1C 6.3 (H) 12/20/2020      Component Value Date/Time    LABOPIA NONE DETECTED 01/06/2021 0515   COCAINSCRNUR NONE DETECTED 01/06/2021 0515   LABBENZ POSITIVE (A) 01/06/2021 0515   AMPHETMU NONE DETECTED 01/06/2021 0515   THCU NONE DETECTED 01/06/2021 0515   LABBARB NONE DETECTED 01/06/2021 0515    Recent Labs  Lab 01/06/21 0323  ETH <10    I have personally reviewed the radiological images below and agree with the radiology interpretations.  CT Angio Head W or Wo Contrast  Result Date: 01/06/2021 CLINICAL DATA:  Initial evaluation for acute stroke, left-sided weakness, now resolved. EXAM: CT ANGIOGRAPHY HEAD AND NECK CT PERFUSION BRAIN TECHNIQUE: Multidetector CT imaging of the head and neck was performed using the standard protocol during bolus administration of intravenous contrast. Multiplanar CT image reconstructions and MIPs were obtained to evaluate the vascular anatomy. Carotid stenosis measurements (when applicable) are obtained utilizing NASCET criteria, using the distal internal carotid diameter as the denominator. Multiphase CT imaging of the brain was performed following IV bolus contrast injection. Subsequent parametric perfusion maps were calculated using RAPID software. CONTRAST:  178mL OMNIPAQUE IOHEXOL 350 MG/ML SOLN COMPARISON:  Prior CT from earlier the same day as well as previous studies from 12/20/2020 and 12/19/2020. FINDINGS: CTA NECK FINDINGS  Aortic arch: Visualized aortic arch normal in caliber with normal 3 vessel morphology. Moderate atheromatous change about the arch and origin of the great vessels without high-grade stenosis. Right carotid system: Right CCA is now widely patent from its origin to the bifurcation. Intravascular stent traverses the right bifurcation extending into the proximal right ICA. Moderate stenosis of the stent at the level of the native bifurcation of up to approximately 50%. Stent is otherwise widely patent without intraluminal thrombus or other complication. Right ICA patent distally to the skull  base without stenosis, dissection or occlusion. Left carotid system: Left CCA tortuous proximally but is widely patent to the bifurcation without stenosis. Mild calcified plaque about the left bifurcation/proximal left ICA without significant stenosis. Left ICA patent distally to the skull base without stenosis, dissection or occlusion. Vertebral arteries: Both vertebral arteries arise from the subclavian arteries. Calcified plaque at the origin of the right subclavian artery with associated mild stenosis again noted. Atheromatous plaque at the origins of both vertebral arteries M cells with associated mild stenosis noted as well, also stable. Left vertebral artery slightly dominant. Vertebral arteries otherwise remain widely patent within the neck without stenosis, dissection or occlusion. Skeleton: No acute osseous finding. No discrete or worrisome osseous lesions. Mild multilevel degenerative spondylosis and facet arthrosis without high-grade stenosis. Other neck: No other acute soft tissue abnormality within the neck. No mass or adenopathy. Post radiation changes again noted. Upper chest: Post radiation scarring noted at the upper lungs bilaterally. Visualized upper chest demonstrates no other acute finding. Review of the MIP images confirms the above findings CTA HEAD FINDINGS Anterior circulation: Petrous segments are now both widely patent. Atheromatous plaque throughout the carotid siphons with no more than mild stenosis at the para clinoid segments. A1 segments widely patent. Normal anterior communicating artery complex. Anterior cerebral arteries remain widely patent within the neck. No M1 stenosis or occlusion. Right M1 bifurcates early. No proximal MCA branch occlusion. Distal MCA branches well perfused and symmetric. Posterior circulation: Both V4 segments remain patent to the vertebrobasilar junction. Both PICA origins are patent and normal. Basilar widely patent to its distal aspect. Superior  cerebellar arteries patent bilaterally. Both PCAs are primarily supplied via the basilar. Mild atheromatous irregularity within the PCAs bilaterally without significant stenosis. Venous sinuses: Grossly patent allowing for timing the contrast bolus. Anatomic variants: None significant.  No aneurysm. Review of the MIP images confirms the above findings CT Brain Perfusion Findings: ASPECTS: 9. IMPRESSION: CTA HEAD AND NECK IMPRESSION: 1. Negative CTA for emergent large vessel occlusion. 2. Interval revascularization of the right common and internal carotid arteries since previous exam, with vascular stent in place across the right carotid bifurcation. Focal narrowing of the stent by approximately 50% at the level of the native bifurcation. Otherwise, widely patent flow seen through the stent. 3. 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 4. Mild bilateral vertebral artery origins stenoses, also stable. 5.  Aortic Atherosclerosis (ICD10-I70.0). CT PERFUSION IMPRESSION: The CT perfusion portion of this exam unfortunately failed, with no perfusion maps generated. Results were called by telephone at the time of interpretation on 01/06/2021 at 4:00 am to provider [facility] , who verbally acknowledged these results. Electronically Signed   By: Jeannine Boga M.D.   On: 01/06/2021 04:45   CT Angio Head W or Wo Contrast  Result Date: 12/19/2020 CLINICAL DATA:  Worsening weakness and dizzy spells for 1 month. Small acute right MCA infarcts on MRI with evidence of right ICA occlusion. EXAM: CT ANGIOGRAPHY  HEAD AND NECK TECHNIQUE: Multidetector CT imaging of the head and neck was performed using the standard protocol during bolus administration of intravenous contrast. Multiplanar CT image reconstructions and MIPs were obtained to evaluate the vascular anatomy. Carotid stenosis measurements (when applicable) are obtained utilizing NASCET criteria, using the distal internal carotid  diameter as the denominator. CONTRAST:  139mL OMNIPAQUE IOHEXOL 350 MG/ML SOLN COMPARISON:  Head MRI 12/19/2020 FINDINGS: CTA NECK FINDINGS Aortic arch: Standard 3 vessel aortic arch with mild-to-moderate calcified plaque. No evidence of significant arch vessel origin stenosis. Right carotid system: The common carotid artery is occluded at its origin without common or internal carotid artery reconstitution in the neck. There is reconstitution of ECA branches. Left carotid system: Patent with mild calcified plaque at the carotid bifurcation. No evidence of significant stenosis or dissection. Vertebral arteries: Calcified plaque results in mild stenosis of the proximal right subclavian artery. The vertebral arteries are patent with the left being mildly dominant. There is mild stenosis of both vertebral artery origins due to calcified plaque. Skeleton: Moderate cervical disc and facet degeneration. Other neck: No evidence of cervical lymphadenopathy or mass. Upper chest: Scarring/potential post radiation fibrosis in the lung apices. Coronary atherosclerosis. Review of the MIP images confirms the above findings CTA HEAD FINDINGS Anterior circulation: There is partial reconstitution of the petrous segment of the right ICA, however the cavernous segment appears largely occluded. There is more robust reconstitution of the right supraclinoid ICA. The intracranial left ICA is patent with mild calcified plaque not resulting in significant stenosis. ACAs and MCAs are patent without evidence of a proximal branch occlusion or significant proximal stenosis. There is slight diffuse asymmetric attenuation of the right MCA compared to the left. No aneurysm is identified. Posterior circulation: The intracranial vertebral arteries are patent with to the basilar at most mild atherosclerotic narrowing bilaterally. Patent PICA and SCA origins are identified bilaterally. The basilar artery is widely patent. There is a small right  posterior communicating artery. Both PCAs are patent with mild atherosclerotic irregularity but no evidence of a significant proximal stenosis. No aneurysm is identified. Venous sinuses: Patent. Anatomic variants: None. Review of the MIP images confirms the above findings IMPRESSION: 1. 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 2. Mild bilateral vertebral artery origin stenoses. 3. Left-sided carotid atherosclerosis without significant stenosis. 4. Aortic Atherosclerosis (ICD10-I70.0). Electronically Signed   By: Logan Bores M.D.   On: 12/19/2020 16:31   CT HEAD WO CONTRAST  Result Date: 12/20/2020 CLINICAL DATA:  Right carotid and MCA thrombectomy. Right MCA territory CVA. EXAM: CT HEAD WITHOUT CONTRAST TECHNIQUE: Contiguous axial images were obtained from the base of the skull through the vertex without intravenous contrast. COMPARISON:  12/19/2020 FINDINGS: Brain: Normal anatomic configuration. Parenchymal volume loss is commensurate with the patient's age. Mild periventricular white matter changes are present likely reflecting the sequela of small vessel ischemia. Remote left occipital cortical infarct is unchanged. No abnormal intra or extra-axial mass lesion or fluid collection. No abnormal mass effect or midline shift. No evidence of acute intracranial hemorrhage or infarct. Ventricular size is normal. Cerebellum unremarkable. Vascular: No asymmetric hyperdense vasculature at the skull base. Skull: Intact Sinuses/Orbits: Paranasal sinuses are clear. Orbits are unremarkable. Other: Mastoid air cells and middle ear cavities are clear. IMPRESSION: No evidence of acute intracranial hemorrhage or infarct. Known tiny punctate cortical infarcts within the right temporoparietal regions on a prior MRI examination are not well appreciated on this exam. Electronically Signed  By: Fidela Salisbury MD   On: 12/20/2020 01:50   CT Head Wo  Contrast  Result Date: 12/19/2020 CLINICAL DATA:  Motor neuron disease. Additional history provided: Patient reports worsening weakness and dizzy spells for 1 month, symptoms worsening this week, fall 2 days ago. EXAM: CT HEAD WITHOUT CONTRAST TECHNIQUE: Contiguous axial images were obtained from the base of the skull through the vertex without intravenous contrast. COMPARISON:  Brain MRI 06/14/2017. FINDINGS: Brain: Mild cerebral and cerebellar atrophy. A small cortically based infarct within the left occipital lobe (PCA vascular territory) is new as compared to the brain MRI of 06/14/2017, but otherwise age-indeterminate. There is no acute intracranial hemorrhage. No extra-axial fluid collection. No evidence of intracranial mass. No midline shift. Vascular: No hyperdense vessel.  Atherosclerotic calcifications. Skull: Normal. Negative for fracture or focal lesion. Sinuses/Orbits: Visualized orbits show no acute finding. Small mucous retention cyst within the inferior right frontal sinus. IMPRESSION: A small cortically based infarct within the left occipital lobe (PCA vascular territory) is new from the brain MRI of 06/14/2017, but otherwise age-indeterminate. Correlate with findings on the pending brain MRI. Stable mild generalized parenchymal atrophy. Small right frontal sinus mucous retention cyst. Electronically Signed   By: Kellie Simmering DO   On: 12/19/2020 14:18   CT Angio Neck W and/or Wo Contrast  Result Date: 01/06/2021 CLINICAL DATA:  Initial evaluation for acute stroke, left-sided weakness, now resolved. EXAM: CT ANGIOGRAPHY HEAD AND NECK CT PERFUSION BRAIN TECHNIQUE: Multidetector CT imaging of the head and neck was performed using the standard protocol during bolus administration of intravenous contrast. Multiplanar CT image reconstructions and MIPs were obtained to evaluate the vascular anatomy. Carotid stenosis measurements (when applicable) are obtained utilizing NASCET criteria, using the  distal internal carotid diameter as the denominator. Multiphase CT imaging of the brain was performed following IV bolus contrast injection. Subsequent parametric perfusion maps were calculated using RAPID software. CONTRAST:  160mL OMNIPAQUE IOHEXOL 350 MG/ML SOLN COMPARISON:  Prior CT from earlier the same day as well as previous studies from 12/20/2020 and 12/19/2020. FINDINGS: CTA NECK FINDINGS Aortic arch: Visualized aortic arch normal in caliber with normal 3 vessel morphology. Moderate atheromatous change about the arch and origin of the great vessels without high-grade stenosis. Right carotid system: Right CCA is now widely patent from its origin to the bifurcation. Intravascular stent traverses the right bifurcation extending into the proximal right ICA. Moderate stenosis of the stent at the level of the native bifurcation of up to approximately 50%. Stent is otherwise widely patent without intraluminal thrombus or other complication. Right ICA patent distally to the skull base without stenosis, dissection or occlusion. Left carotid system: Left CCA tortuous proximally but is widely patent to the bifurcation without stenosis. Mild calcified plaque about the left bifurcation/proximal left ICA without significant stenosis. Left ICA patent distally to the skull base without stenosis, dissection or occlusion. Vertebral arteries: Both vertebral arteries arise from the subclavian arteries. Calcified plaque at the origin of the right subclavian artery with associated mild stenosis again noted. Atheromatous plaque at the origins of both vertebral arteries M cells with associated mild stenosis noted as well, also stable. Left vertebral artery slightly dominant. Vertebral arteries otherwise remain widely patent within the neck without stenosis, dissection or occlusion. Skeleton: No acute osseous finding. No discrete or worrisome osseous lesions. Mild multilevel degenerative spondylosis and facet arthrosis without  high-grade stenosis. Other neck: No other acute soft tissue abnormality within the neck. No mass or adenopathy. Post radiation changes  again noted. Upper chest: Post radiation scarring noted at the upper lungs bilaterally. Visualized upper chest demonstrates no other acute finding. Review of the MIP images confirms the above findings CTA HEAD FINDINGS Anterior circulation: Petrous segments are now both widely patent. Atheromatous plaque throughout the carotid siphons with no more than mild stenosis at the para clinoid segments. A1 segments widely patent. Normal anterior communicating artery complex. Anterior cerebral arteries remain widely patent within the neck. No M1 stenosis or occlusion. Right M1 bifurcates early. No proximal MCA branch occlusion. Distal MCA branches well perfused and symmetric. Posterior circulation: Both V4 segments remain patent to the vertebrobasilar junction. Both PICA origins are patent and normal. Basilar widely patent to its distal aspect. Superior cerebellar arteries patent bilaterally. Both PCAs are primarily supplied via the basilar. Mild atheromatous irregularity within the PCAs bilaterally without significant stenosis. Venous sinuses: Grossly patent allowing for timing the contrast bolus. Anatomic variants: None significant.  No aneurysm. Review of the MIP images confirms the above findings CT Brain Perfusion Findings: ASPECTS: 9. IMPRESSION: CTA HEAD AND NECK IMPRESSION: 1. Negative CTA for emergent large vessel occlusion. 2. Interval revascularization of the right common and internal carotid arteries since previous exam, with vascular stent in place across the right carotid bifurcation. Focal narrowing of the stent by approximately 50% at the level of the native bifurcation. Otherwise, widely patent flow seen through the stent. 3. 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 4. Mild bilateral vertebral artery origins stenoses, also stable. 5.  Aortic  Atherosclerosis (ICD10-I70.0). CT PERFUSION IMPRESSION: The CT perfusion portion of this exam unfortunately failed, with no perfusion maps generated. Results were called by telephone at the time of interpretation on 01/06/2021 at 4:00 am to provider [facility] , who verbally acknowledged these results. Electronically Signed   By: Jeannine Boga M.D.   On: 01/06/2021 04:45   CT Angio Neck W and/or Wo Contrast  Result Date: 12/19/2020 CLINICAL DATA:  Worsening weakness and dizzy spells for 1 month. Small acute right MCA infarcts on MRI with evidence of right ICA occlusion. EXAM: CT ANGIOGRAPHY HEAD AND NECK TECHNIQUE: Multidetector CT imaging of the head and neck was performed using the standard protocol during bolus administration of intravenous contrast. Multiplanar CT image reconstructions and MIPs were obtained to evaluate the vascular anatomy. Carotid stenosis measurements (when applicable) are obtained utilizing NASCET criteria, using the distal internal carotid diameter as the denominator. CONTRAST:  1106mL OMNIPAQUE IOHEXOL 350 MG/ML SOLN COMPARISON:  Head MRI 12/19/2020 FINDINGS: CTA NECK FINDINGS Aortic arch: Standard 3 vessel aortic arch with mild-to-moderate calcified plaque. No evidence of significant arch vessel origin stenosis. Right carotid system: The common carotid artery is occluded at its origin without common or internal carotid artery reconstitution in the neck. There is reconstitution of ECA branches. Left carotid system: Patent with mild calcified plaque at the carotid bifurcation. No evidence of significant stenosis or dissection. Vertebral arteries: Calcified plaque results in mild stenosis of the proximal right subclavian artery. The vertebral arteries are patent with the left being mildly dominant. There is mild stenosis of both vertebral artery origins due to calcified plaque. Skeleton: Moderate cervical disc and facet degeneration. Other neck: No evidence of cervical  lymphadenopathy or mass. Upper chest: Scarring/potential post radiation fibrosis in the lung apices. Coronary atherosclerosis. Review of the MIP images confirms the above findings CTA HEAD FINDINGS Anterior circulation: There is partial reconstitution of the petrous segment of the right ICA, however the cavernous segment appears largely occluded. There is more  robust reconstitution of the right supraclinoid ICA. The intracranial left ICA is patent with mild calcified plaque not resulting in significant stenosis. ACAs and MCAs are patent without evidence of a proximal branch occlusion or significant proximal stenosis. There is slight diffuse asymmetric attenuation of the right MCA compared to the left. No aneurysm is identified. Posterior circulation: The intracranial vertebral arteries are patent with to the basilar at most mild atherosclerotic narrowing bilaterally. Patent PICA and SCA origins are identified bilaterally. The basilar artery is widely patent. There is a small right posterior communicating artery. Both PCAs are patent with mild atherosclerotic irregularity but no evidence of a significant proximal stenosis. No aneurysm is identified. Venous sinuses: Patent. Anatomic variants: None. Review of the MIP images confirms the above findings IMPRESSION: 1. 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 2. Mild bilateral vertebral artery origin stenoses. 3. Left-sided carotid atherosclerosis without significant stenosis. 4. Aortic Atherosclerosis (ICD10-I70.0). Electronically Signed   By: Logan Bores M.D.   On: 12/19/2020 16:31   MR BRAIN WO CONTRAST  Result Date: 01/06/2021 CLINICAL DATA:  Neuro deficit, acute, stroke suspected. EXAM: MRI HEAD WITHOUT CONTRAST TECHNIQUE: Multiplanar, multiecho pulse sequences of the brain and surrounding structures were obtained without intravenous contrast. COMPARISON:  Noncontrast head CT and CT angiogram  head/neck as well as CT perfusion performed 01/06/2021. brain MRI 02/19/2021. FINDINGS: Brain: Mild intermittent motion degradation. Mild cerebral and cerebellar atrophy. There is a 14 mm focus of restricted diffusion within the right internal capsule/lentiform nucleus compatible with acute infarction. Redemonstrated small chronic cortical infarct within the left occipital lobe (PCA vascular territory). Background mild multifocal T2/FLAIR hyperintensity within the cerebral white matter is nonspecific, but compatible with chronic small vessel ischemic disease. Redemonstrated small chronic infarct within the left cerebellar hemisphere. No evidence of intracranial mass. No chronic intracranial blood products. No extra-axial fluid collection. No midline shift. Vascular: Expected proximal arterial flow voids. Skull and upper cervical spine: No focal marrow lesion. Sinuses/Orbits: Visualized orbits show no acute finding. Trace bilateral ethmoid sinus mucosal thickening. Other: Right mastoid effusion. IMPRESSION: 14 mm acute infarct within the right internal capsule/lentiform nucleus. Redemonstrated small chronic cortical infarct within the left occipital lobe. Stable background mild generalized parenchymal atrophy and mild cerebral white matter chronic small vessel ischemic disease. Redemonstrated small chronic infarct within the left cerebellar hemisphere. Mild bilateral ethmoid sinus mucosal thickening. Right mastoid effusion. Electronically Signed   By: Kellie Simmering DO   On: 01/06/2021 14:16   MR BRAIN WO CONTRAST  Result Date: 12/20/2020 CLINICAL DATA:  80 year old male found to have right carotid occlusion, two small acute right MCA cortical infarcts on MRI yesterday. And repeat neck CTA yesterday demonstrating subsequent acute large vessel occlusion at the right MCA bifurcation. Then status post endovascular thrombectomy. EXAM: MRI HEAD WITHOUT CONTRAST TECHNIQUE: Multiplanar, multiecho pulse sequences of the  brain and surrounding structures were obtained without intravenous contrast. COMPARISON:  Brain MRI 1418 hours yesterday, along with CTA head and neck x2 yesterday. FINDINGS: Brain: There remain only a few small cortical and subcortical white matter infarcts scattered in the right MCA territory, such as on series 2, image 40. About six such areas are present now, versus the two seen yesterday. No contralateral left hemisphere or posterior fossa restricted diffusion. Minor cytotoxic edema in the affected areas with no acute hemorrhage. No mass effect. A few scattered chronic microhemorrhages in the brain, mostly the left hemisphere, are stable since yesterday. Stable pre-existing scattered white  matter T2 and FLAIR hyperintensity, and a small area of chronic encephalomalacia in the left occipital pole. Stable small chronic left cerebellar infarct near midline. No midline shift, mass effect, evidence of mass lesion, ventriculomegaly, extra-axial collection or acute intracranial hemorrhage. Cervicomedullary junction and pituitary are within normal limits. Vascular: Major intracranial vascular flow voids are now normalized, including the right ICA siphon. Skull and upper cervical spine: Stable and negative for age. Sinuses/Orbits: Stable and negative. Other: Trace mastoid fluid is stable. Negative visible scalp and face. IMPRESSION: 1. Following right carotid and MCA revascularization there are only a few scattered small cortical and subcortical white matter infarcts in the right MCA territory, in addition to the two seen by MRI yesterday. No hemorrhage or mass effect. 2. Small chronic left PCA territory infarct, small left cerebellar infarct. Mild for age white matter signal changes. Electronically Signed   By: Genevie Ann M.D.   On: 12/20/2020 06:59   MR BRAIN WO CONTRAST  Result Date: 12/19/2020 CLINICAL DATA:  Worsening ataxia over the past few weeks. EXAM: MRI HEAD WITHOUT CONTRAST TECHNIQUE: Multiplanar, multiecho  pulse sequences of the brain and surrounding structures were obtained without intravenous contrast. COMPARISON:  Head CT 12/19/2020 and MRI 06/14/2017 FINDINGS: Brain: There are subcentimeter acute cortical infarcts at the right temporoparietal junction as well as more superiorly in the right parietal lobe. The small left occipital infarct seen on CT is chronic. There is also a small chronic cortical infarct anterolaterally in the right frontal lobe. There is a small chronic infarct medially in the left cerebellar hemisphere. Scattered small foci of T2 hyperintensity in the cerebral white matter and pons have slightly progressed from the prior MRI and are nonspecific but compatible with mild chronic small vessel ischemic disease. There is mild generalized cerebral atrophy. Scattered chronic microhemorrhages in both cerebral hemispheres have slightly increased in number from the prior MRI. No mass, midline shift, or extra-axial fluid collection is identified. Vascular: Loss of the normal flow void of the distal cervical and proximal intracranial portions of the right internal carotid artery with reconstitution of the supraclinoid ICA flow void. Skull and upper cervical spine: Unremarkable bone marrow signal. Sinuses/Orbits: Bilateral cataract extraction. Minimal mucosal thickening in the paranasal sinuses. Small right mastoid effusion. Other: None. IMPRESSION: 1. Two small acute cortical infarcts in the right MCA territory. 2. Multiple small chronic cerebral and cerebellar infarcts as above. 3. Absent flow void in the distal cervical and proximal intracranial right internal carotid consistent with occlusion, new from 2018. Electronically Signed   By: Logan Bores M.D.   On: 12/19/2020 15:06   CT CHEST ABDOMEN PELVIS W CONTRAST  Result Date: 12/26/2020 CLINICAL DATA:  Abdominal pain, fever EXAM: CT CHEST, ABDOMEN, AND PELVIS WITH CONTRAST TECHNIQUE: Multidetector CT imaging of the chest, abdomen and pelvis was  performed following the standard protocol during bolus administration of intravenous contrast. CONTRAST:  100 mL Omnipaque 300 IV COMPARISON:  CT abdomen 08/17/2020. FINDINGS: CT CHEST FINDINGS Cardiovascular: Cardiomegaly. Prior CABG. Diffuse aortic atherosclerosis. No aneurysm. Mediastinum/Nodes: No mediastinal, hilar, or axillary adenopathy. Trachea and esophagus are unremarkable. Thyroid unremarkable. Lungs/Pleura: Airspace disease noted throughout the right middle lobe and both lower lobes most compatible with pneumonia. No effusions. Musculoskeletal: Chest wall soft tissues are unremarkable. No acute bony abnormality. CT ABDOMEN PELVIS FINDINGS Hepatobiliary: No focal hepatic abnormality. Gallbladder unremarkable. Pancreas: No focal abnormality or ductal dilatation. Spleen: No focal abnormality.  Normal size. Adrenals/Urinary Tract: No hydronephrosis or ureteral stones. Small cyst in the midpole of  the right kidney. Adrenal glands and urinary bladder unremarkable. Stomach/Bowel: Large stool burden throughout the colon. No evidence of bowel obstruction. Appendix is normal. Vascular/Lymphatic: Aortoiliac atherosclerosis. No evidence of aneurysm or adenopathy. Reproductive: No visible focal abnormality. Other: No free fluid or free air. Musculoskeletal: No acute bony abnormality. Degenerative changes in the lumbar spine. IMPRESSION: Bilateral airspace disease throughout the right middle lobe and both lower lobes compatible with pneumonia. Large stool burden in the colon. Prior CABG.  Diffuse aortic atherosclerosis. Electronically Signed   By: Rolm Baptise M.D.   On: 12/26/2020 22:56   IR CT Head Ltd  Result Date: 12/21/2020 INDICATION: 80 year old male with past medical history significant for aortic atherosclerosis, bilateral renal artery stenosis, chronic kidney disease, coronary artery, gastroesophageal reflux disease, hyperlipidemia, chronic orthostatic hypotension, and BPH. He initially presented to [facility]  complaining of generalized weakness, tremors and vision changes. 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 During transferred to [facility] he was noted to have sudden change in his neurological exam with sudden onset of left-sided weakness. Repeat CT angiogram showed a new right MCA/M1 occlusion in addition to his cervical occlusion. Acute neuro changes noted at 17:40 on 12/19/2020, NIHSS 19 at presentation to [facility], modified Rankin scale 0. He was then transferred to our service for emergency cerebral angiogram and thrombectomy. EXAM: ULTRASOUND-GUIDED VASCULAR ACCESS DIAGNOSTIC CEREBRAL ANGIOGRAM MECHANICAL THROMBECTOMY FLAT PANEL HEAD CT COMPARISON:  CT/CT angiogram of the head and neck December 19, 2020. MEDICATIONS: Cangrelor bolus and drip, IV. ANESTHESIA/SEDATION: The procedure was performed under general anesthesia. CONTRAST:  75 mL of Omnipaque 240 milligrams/mL. FLUOROSCOPY TIME:  Fluoroscopy Time: 30 minutes 24 seconds (941.1 mGy). COMPLICATIONS: None immediate. TECHNIQUE: Informed written consent was obtained from the patient's spouse after a thorough discussion of the procedural risks, benefits and alternatives. All questions were addressed. Maximal Sterile Barrier Technique was utilized including caps, mask, sterile gowns, sterile gloves, sterile drape, hand hygiene and skin antiseptic. A timeout was performed prior to the initiation of the procedure. The right groin was prepped and draped in the usual sterile fashion. Using a micropuncture kit and the modified Seldinger technique, access was gained to the right common femoral artery and an 8 French sheath was placed. Real-time ultrasound guidance was utilized for vascular access including the acquisition of a permanent ultrasound image documenting patency of the accessed vessel. Under fluoroscopy, an 8 [place] Walrus balloon guide catheter  was navigated over a 6 [place] Berenstein 2 catheter and a 0.035" Terumo Glidewire into the aortic arch. The catheter was placed into the right common carotid artery frontal and lateral angiograms of the neck were obtained. FINDINGS: Filling defect consistent with clot within the right common carotid artery, along the carotid bifurcation with contrast penetration into the cervical right ICA. Large filling defect within the bulb and proximal cervical right ICA and occlusive clot within the petrous segment of the right ICA. PROCEDURE: The Berenstein 2 catheter was then advanced over the wire into the right internal carotid artery. The walrus guide catheter was then advanced over the [facility] 2 catheter into the cervical right internal carotid artery. The Berenstein 2 and wire were removed. Manual aspiration performed until clear blood was seen. Large amount of clot retrieved. Right ICA angiograms were performed with frontal and lateral views of the upper neck and head. Recanalization of the cervical and intracranial right ICA noted as well as recanalization of the M1  segment. An occlusive clot within the distal right M2/MCA posterior division branch noted. Under biplane roadmap, a zoom 55 aspiration catheter was navigated over an Aristotle 14 microguidewire into the right M1/MCA. The aspiration catheter was then advanced to the level of occlusion and connected to a penumbra aspiration pump. The guiding catheter balloon was inflated. The aspiration catheter was removed under constant aspiration. Follow-up right ICA angiograms with frontal and lateral views of the head showed complete recanalization of the right MCA territory. The guiding catheter was retracted into the right common carotid artery. Angioplasty at the right carotid bifurcation was performed with a 3 x 12 mm trek balloon. Right common carotid artery angiograms with frontal and lateral views of the neck showed improved caliber at the right carotid  bifurcation, occlusion of the ECA and nonocclusive filling defect concerning for clot. Aspiration through the guiding catheter was performed. However, follow-up right common carotid artery angiogram showed progression of clot formation and near occlusion of the proximal right ICA. Flat panel CT of the head was obtained and post processed in a separate workstation with concurrent attending physician supervision. Selected images were sent to PACS. No evidence of hemorrhagic complication seen. Patient was loaded on cangrelor. Subsequently, a 6-8 x 40 mm XACT carotid stent was deployed across the area of stenosis. Follow-up angiogram showed resolution of the stenosis with prompt fall or flow with no evidence of residual filling defect. Right internal carotid artery angiogram with frontal and lateral views of the head showed reocclusion of the right MCA at the proximal M1 segment. Under biplane roadmap, a zoom 71 aspiration catheter was navigated over an Aristotle 14 microguidewire into the right M1/MCA. The aspiration catheter was connected to a penumbra aspiration pump. The guiding catheter balloon was inflated. The aspiration catheter was removed under constant aspiration. Follow-up right ICA angiograms with frontal and lateral views of the head showed complete recanalization of the right MCA territory. Delay angiograms of the right common carotid artery showed patent recently deployed stent with no evidence of thromboembolic complication. Delayed angiograms of the head with frontal and lateral views showed no thromboembolic complication. Right common femoral artery angiograms were obtained with frontal and lateral views. The right common femoral artery has normal caliber. The femoral sheath was exchanged for an 8 [place] Angio-Seal which was utilized for access closure. Immediate hemostasis was achieved. 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 PLAN: 1. Patient will be started on dual anti-platelet therapy with Brilinta and aspirin after follow-up CT confirms no evidence of hemorrhagic complication. 2. Carotid duplex within 3 months to evaluate for stent patency. Electronically Signed   By: Pedro Earls M.D.   On: 12/21/2020 16:56   IR US Guide Vasc Access Right  Result Date: 12/21/2020 INDICATION: 80 year old male with past medical history significant for aortic atherosclerosis, bilateral renal artery stenosis, chronic kidney disease, coronary artery, gastroesophageal reflux disease, hyperlipidemia, chronic orthostatic hypotension, and BPH. He initially presented to [facility] complaining of generalized weakness, tremors and vision changes. 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 During transferred to [facility] he was noted to have sudden change in his neurological exam with sudden onset of left-sided weakness. Repeat CT angiogram showed a new right MCA/M1 occlusion in addition to his cervical occlusion. Acute neuro  changes noted at 17:40 on 12/19/2020, NIHSS 19 at presentation to [facility], modified Rankin scale 0. He was then transferred to our service for emergency cerebral angiogram and thrombectomy. EXAM: ULTRASOUND-GUIDED VASCULAR ACCESS DIAGNOSTIC CEREBRAL ANGIOGRAM MECHANICAL THROMBECTOMY FLAT PANEL HEAD CT COMPARISON:  CT/CT angiogram of the head and neck December 19, 2020. MEDICATIONS: Cangrelor bolus and drip, IV. ANESTHESIA/SEDATION: The procedure was performed under general anesthesia. CONTRAST:  75 mL of Omnipaque 240 milligrams/mL. FLUOROSCOPY TIME:  Fluoroscopy Time: 30 minutes 24 seconds (941.1 mGy). COMPLICATIONS: None immediate. TECHNIQUE: Informed written consent was obtained from the  patient's spouse after a thorough discussion of the procedural risks, benefits and alternatives. All questions were addressed. Maximal Sterile Barrier Technique was utilized including caps, mask, sterile gowns, sterile gloves, sterile drape, hand hygiene and skin antiseptic. A timeout was performed prior to the initiation of the procedure. The right groin was prepped and draped in the usual sterile fashion. Using a micropuncture kit and the modified Seldinger technique, access was gained to the right common femoral artery and an 8 French sheath was placed. Real-time ultrasound guidance was utilized for vascular access including the acquisition of a permanent ultrasound image documenting patency of the accessed vessel. Under fluoroscopy, an 8 [place] Walrus balloon guide catheter was navigated over a 6 [place] Berenstein 2 catheter and a 0.035" Terumo Glidewire into the aortic arch. The catheter was placed into the right common carotid artery frontal and lateral angiograms of the neck were obtained. FINDINGS: Filling defect consistent with clot within the right common carotid artery, along the carotid bifurcation with contrast penetration into the cervical right ICA. Large filling defect within the bulb and proximal cervical right ICA and occlusive clot within the petrous segment of the right ICA. PROCEDURE: The Berenstein 2 catheter was then advanced over the wire into the right internal carotid artery. The walrus guide catheter was then advanced over the [facility] 2 catheter into the cervical right internal carotid artery. The Berenstein 2 and wire were removed. Manual aspiration performed until clear blood was seen. Large amount of clot retrieved. Right ICA angiograms were performed with frontal and lateral views of the upper neck and head. Recanalization of the cervical and intracranial right ICA noted as well as recanalization of the M1 segment. An occlusive clot within the distal right M2/MCA posterior division  branch noted. Under biplane roadmap, a zoom 55 aspiration catheter was navigated over an Aristotle 14 microguidewire into the right M1/MCA. The aspiration catheter was then advanced to the level of occlusion and connected to a penumbra aspiration pump. The guiding catheter balloon was inflated. The aspiration catheter was removed under constant aspiration. Follow-up right ICA angiograms with frontal and lateral views of the head showed complete recanalization of the right MCA territory. The guiding catheter was retracted into the right common carotid artery. Angioplasty at the right carotid bifurcation was performed with a 3 x 12 mm trek balloon. Right common carotid artery angiograms with frontal and lateral views of the neck showed improved caliber at the right carotid bifurcation, occlusion of the ECA and nonocclusive filling defect concerning for clot. Aspiration through the guiding catheter was performed. However, follow-up right common carotid artery angiogram showed progression of clot formation and near occlusion of the proximal right ICA. Flat panel CT of the head was obtained and post processed in a separate workstation with concurrent attending physician supervision. Selected images were sent to PACS. No evidence of hemorrhagic complication seen. Patient was loaded on cangrelor. Subsequently, a 6-8 x 40 mm  XACT carotid stent was deployed across the area of stenosis. Follow-up angiogram showed resolution of the stenosis with prompt fall or flow with no evidence of residual filling defect. Right internal carotid artery angiogram with frontal and lateral views of the head showed reocclusion of the right MCA at the proximal M1 segment. Under biplane roadmap, a zoom 71 aspiration catheter was navigated over an Aristotle 14 microguidewire into the right M1/MCA. The aspiration catheter was connected to a penumbra aspiration pump. The guiding catheter balloon was inflated. The aspiration catheter was removed  under constant aspiration. Follow-up right ICA angiograms with frontal and lateral views of the head showed complete recanalization of the right MCA territory. Delay angiograms of the right common carotid artery showed patent recently deployed stent with no evidence of thromboembolic complication. Delayed angiograms of the head with frontal and lateral views showed no thromboembolic complication. Right common femoral artery angiograms were obtained with frontal and lateral views. The right common femoral artery has normal caliber. The femoral sheath was exchanged for an 8 [place] Angio-Seal which was utilized for access closure. Immediate hemostasis was achieved. 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 PLAN: 1. Patient will be started on dual anti-platelet therapy with Brilinta and aspirin after follow-up CT confirms no evidence of hemorrhagic complication. 2. Carotid duplex within 3 months to evaluate for stent patency. Electronically Signed   By: Pedro Earls M.D.   On: 12/21/2020 16:56   CT CEREBRAL PERFUSION W CONTRAST  Result Date: 01/06/2021 CLINICAL DATA:  Initial evaluation for acute stroke, left-sided weakness, now resolved. EXAM: CT ANGIOGRAPHY HEAD AND NECK CT PERFUSION BRAIN TECHNIQUE: Multidetector CT imaging of the head and neck was performed using the standard protocol during bolus administration of intravenous contrast. Multiplanar CT image reconstructions and MIPs were obtained to evaluate the vascular anatomy. Carotid stenosis measurements (when applicable) are obtained utilizing NASCET criteria, using the distal internal carotid diameter as the denominator. Multiphase CT imaging of the brain was performed following IV bolus contrast injection. Subsequent parametric perfusion maps were  calculated using RAPID software. CONTRAST:  144mL OMNIPAQUE IOHEXOL 350 MG/ML SOLN COMPARISON:  Prior CT from earlier the same day as well as previous studies from 12/20/2020 and 12/19/2020. FINDINGS: CTA NECK FINDINGS Aortic arch: Visualized aortic arch normal in caliber with normal 3 vessel morphology. Moderate atheromatous change about the arch and origin of the great vessels without high-grade stenosis. Right carotid system: Right CCA is now widely patent from its origin to the bifurcation. Intravascular stent traverses the right bifurcation extending into the proximal right ICA. Moderate stenosis of the stent at the level of the native bifurcation of up to approximately 50%. Stent is otherwise widely patent without intraluminal thrombus or other complication. Right ICA patent distally to the skull base without stenosis, dissection or occlusion. Left carotid system: Left CCA tortuous proximally but is widely patent to the bifurcation without stenosis. Mild calcified plaque about the left bifurcation/proximal left ICA without significant stenosis. Left ICA patent distally to the skull base without stenosis, dissection or occlusion. Vertebral arteries: Both vertebral arteries arise from the subclavian arteries. Calcified plaque at the origin of the right subclavian artery with associated mild stenosis again noted. Atheromatous plaque at the origins of both vertebral arteries M cells with associated mild stenosis noted as well, also stable. Left vertebral artery slightly dominant. Vertebral arteries otherwise remain widely  patent within the neck without stenosis, dissection or occlusion. Skeleton: No acute osseous finding. No discrete or worrisome osseous lesions. Mild multilevel degenerative spondylosis and facet arthrosis without high-grade stenosis. Other neck: No other acute soft tissue abnormality within the neck. No mass or adenopathy. Post radiation changes again noted. Upper chest: Post radiation scarring  noted at the upper lungs bilaterally. Visualized upper chest demonstrates no other acute finding. Review of the MIP images confirms the above findings CTA HEAD FINDINGS Anterior circulation: Petrous segments are now both widely patent. Atheromatous plaque throughout the carotid siphons with no more than mild stenosis at the para clinoid segments. A1 segments widely patent. Normal anterior communicating artery complex. Anterior cerebral arteries remain widely patent within the neck. No M1 stenosis or occlusion. Right M1 bifurcates early. No proximal MCA branch occlusion. Distal MCA branches well perfused and symmetric. Posterior circulation: Both V4 segments remain patent to the vertebrobasilar junction. Both PICA origins are patent and normal. Basilar widely patent to its distal aspect. Superior cerebellar arteries patent bilaterally. Both PCAs are primarily supplied via the basilar. Mild atheromatous irregularity within the PCAs bilaterally without significant stenosis. Venous sinuses: Grossly patent allowing for timing the contrast bolus. Anatomic variants: None significant.  No aneurysm. Review of the MIP images confirms the above findings CT Brain Perfusion Findings: ASPECTS: 9. IMPRESSION: CTA HEAD AND NECK IMPRESSION: 1. Negative CTA for emergent large vessel occlusion. 2. Interval revascularization of the right common and internal carotid arteries since previous exam, with vascular stent in place across the right carotid bifurcation. Focal narrowing of the stent by approximately 50% at the level of the native bifurcation. Otherwise, widely patent flow seen through the stent. 3. 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 4. Mild bilateral vertebral artery origins stenoses, also stable. 5.  Aortic Atherosclerosis (ICD10-I70.0). CT PERFUSION IMPRESSION: The CT perfusion portion of this exam unfortunately failed, with no perfusion maps generated. Results were called by telephone at  the time of interpretation on 01/06/2021 at 4:00 am to provider [facility] , who verbally acknowledged these results. Electronically Signed   By: Jeannine Boga M.D.   On: 01/06/2021 04:45   DG Chest Portable 1 View  Result Date: 01/06/2021 CLINICAL DATA:  Altered mental status EXAM: PORTABLE CHEST 1 VIEW COMPARISON:  12/29/2020 FINDINGS: Heart is normal size. Aortic atherosclerosis. No confluent opacities or effusions. No acute bony abnormality area IMPRESSION: No active disease. Electronically Signed   By: Rolm Baptise M.D.   On: 01/06/2021 03:52   DG CHEST PORT 1 VIEW  Result Date: 12/29/2020 CLINICAL DATA:  Shortness of breath. EXAM: PORTABLE CHEST 1 VIEW COMPARISON:  12/26/2020 FINDINGS: Borderline enlarged cardiac silhouette. Post CABG changes. Mildly tortuous and calcified thoracic aorta. Significantly improved airspace opacity on the right with minimal residual opacity at the right lung base. Resolved left lung airspace opacity with a small amount of linear scarring or atelectasis at the left lateral lung base. No pleural fluid. Thoracic spine degenerative changes. IMPRESSION: 1. Significantly improved pneumonia on the right with minimal residual opacity at the right lung base. 2. Resolved left lung pneumonia. Electronically Signed   By: Claudie Revering M.D.   On: 12/29/2020 16:12   DG Chest Port 1 View  Result Date: 12/26/2020 CLINICAL DATA:  Pt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 were obtained. FINDINGS: Filling defect consistent with clot within the right common carotid artery, along the carotid bifurcation with contrast penetration into the cervical right ICA. Large filling defect within the bulb and proximal cervical right ICA and occlusive clot within the petrous segment of the right ICA. PROCEDURE: The Berenstein 2 catheter was then advanced over the wire into the right internal carotid artery. The walrus guide catheter was then advanced over the [facility] 2 catheter into the cervical right internal carotid artery. The Berenstein 2 and wire were removed. Manual aspiration performed until clear blood was seen. Large amount of clot retrieved. Right ICA angiograms were performed with frontal and lateral views of the upper neck and head. Recanalization of the cervical and intracranial right ICA noted as well as recanalization of the M1 segment. An occlusive clot within the distal right M2/MCA posterior division branch noted. Under biplane roadmap, a zoom 55 aspiration catheter was navigated over an Aristotle 14 microguidewire into the right M1/MCA. The aspiration catheter was then advanced to the level of occlusion and connected to a penumbra aspiration pump. The guiding catheter balloon was inflated. The aspiration catheter was removed under constant aspiration. Follow-up right ICA angiograms with frontal and lateral views of the head showed complete recanalization of the right MCA territory. The guiding catheter was retracted into the right common carotid artery. Angioplasty at the right carotid bifurcation was performed with a 3 x 12 mm trek balloon. Right common carotid artery  angiograms with frontal and lateral views of the neck showed improved caliber at the right carotid bifurcation, occlusion of the ECA and nonocclusive filling defect concerning for clot. Aspiration through the guiding catheter was performed. However, follow-up right common carotid artery  angiogram showed progression of clot formation and near occlusion of the proximal right ICA. Flat panel CT of the head was obtained and post processed in a separate workstation with concurrent attending physician supervision. Selected images were sent to PACS. No evidence of hemorrhagic complication seen. Patient was loaded on cangrelor. Subsequently, a 6-8 x 40 mm XACT carotid stent was deployed across the area of stenosis. Follow-up angiogram showed resolution of the stenosis with prompt fall or flow with no evidence of residual filling defect. Right internal carotid artery angiogram with frontal and lateral views of the head showed reocclusion of the right MCA at the proximal M1 segment. Under biplane roadmap, a zoom 71 aspiration catheter was navigated over an Aristotle 14 microguidewire into the right M1/MCA. The aspiration catheter was connected to a penumbra aspiration pump. The guiding catheter balloon was inflated. The aspiration catheter was removed under constant aspiration. Follow-up right ICA angiograms with frontal and lateral views of the head showed complete recanalization of the right MCA territory. Delay angiograms of the right common carotid artery showed patent recently deployed stent with no evidence of thromboembolic complication. Delayed angiograms of the head with frontal and lateral views showed no thromboembolic complication. Right common femoral artery angiograms were obtained with frontal and lateral views. The right common femoral artery has normal caliber. The femoral sheath was exchanged for an 8 [place] Angio-Seal which was utilized for access closure. Immediate hemostasis was achieved. IMPRESSION:  Successful and uncomplicated mechanical thrombectomy and right carotid bifurcation stenting for treatment of right common carotid artery, cervical and intracranial right internal carotid artery and right MCA occlusion. PLAN: 1. Patient will be started on dual anti-platelet therapy with Brilinta and aspirin after follow-up CT confirms no evidence of hemorrhagic complication. 2. Carotid duplex within 3 months to evaluate for stent patency. Electronically Signed   By: Pedro Earls M.D.   On: 12/21/2020 16:56   CT HEAD CODE STROKE WO CONTRAST  Result Date: 01/06/2021 CLINICAL DATA:  Code stroke. Initial evaluation for acute left-sided weakness EXAM: CT HEAD WITHOUT CONTRAST TECHNIQUE: Contiguous axial images were obtained from the base of the skull through the vertex without intravenous contrast. COMPARISON:  Prior CT and MRI from 12/20/2020. FINDINGS: Brain: Cerebral volume within normal limits for age. No acute intracranial hemorrhage. There is a new 1 cm hypodensity at the right lentiform nucleus/putamen, consistent with a small ischemic infarct, acute to subacute in appearance. This is new as compared to previous MRI and CT from 12/20/2020. No other evidence for acute large vessel territory infarct. No mass lesion, midline shift or mass effect. No hydrocephalus or extra-axial fluid collection. 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 Vascular: No visible hyperdense vessel. Skull: Scalp soft tissues and calvarium within normal limits. Sinuses/Orbits: Globes and orbital soft tissues demonstrate no acute finding. Paranasal sinuses are largely clear. No mastoid effusion. Other: None. ASPECTS ([place] Stroke Program Early CT Score) - Ganglionic level infarction (caudate, lentiform nuclei, internal capsule, insula, M1-M3 cortex): 6 - Supraganglionic infarction (M4-M6 cortex): 3 Total score (0-10 with 10 being normal): 9 IMPRESSION: 1. 1 cm hypodensity at the right putamen, somewhat  age indeterminate, but could reflect an acute to subacute ischemic infarct. This is new as compared to recent MRI and CT from 12/20/2020. No intracranial hemorrhage. 2. ASPECTS is 9. Critical Value/emergent results were called by telephone at the time of interpretation on 01/06/2021 at 3:24 am to provider [facility] , who verbally acknowledged these results. Electronically Signed   By:  Jeannine Boga M.D.   On: 01/06/2021 03:29   CT HEAD CODE STROKE WO CONTRAST  Result Date: 12/19/2020 CLINICAL DATA:  Code stroke.  Left-sided facial droop. EXAM: CT HEAD WITHOUT CONTRAST TECHNIQUE: Contiguous axial images were obtained from the base of the skull through the vertex without intravenous contrast. COMPARISON:  Head CT, CTA, and MRI 12/19/2020 FINDINGS: Brain: No acute large territory infarct, intracranial hemorrhage, mass, midline shift, or extra-axial fluid collection is identified. The 2 subcentimeter acute right MCA cortical infarcts on MRI are not well shown by CT. There is mild cerebral atrophy. Hypodensities in the cerebral white matter bilaterally are unchanged and nonspecific but compatible with mild chronic small vessel ischemic disease. Small chronic infarcts are again noted in the right frontal lobe and left occipital lobe. Vascular: Limited assessment due to residual intravascular contrast from today's earlier CTA. Skull: No fracture or suspicious osseous lesion. Sinuses/Orbits: Small mucous retention cyst in the right frontal sinus. Small right mastoid effusion. Bilateral cataract extraction. Other: None. ASPECTS [facility] Stroke Program Early CT Score) - Ganglionic level infarction (caudate, lentiform nuclei, internal capsule, insula, M1-M3 cortex): 7 - Supraganglionic infarction (M4-M6 cortex): 3 Total score (0-10 with 10 being normal): 10 IMPRESSION: 1. No evidence of acute large territory infarct or intracranial hemorrhage. Subcentimeter acute right MCA infarcts on MRI are occult by CT. 2.  ASPECTS is 10. These results were communicated to Dr. Lorrin Goodell at 6:24 pm on 12/19/2020 by text page via the [facility] messaging system. Electronically Signed   By: Logan Bores M.D.   On: 12/19/2020 18:24   CT ANGIO HEAD CODE STROKE  Result Date: 12/19/2020 CLINICAL DATA:  Left facial droop. EXAM: CT ANGIOGRAPHY HEAD AND NECK TECHNIQUE: Multidetector CT imaging of the head and neck was performed using the standard protocol during bolus administration of intravenous contrast. Multiplanar CT image reconstructions and MIPs were obtained to evaluate the vascular anatomy. Carotid stenosis measurements (when applicable) are obtained utilizing NASCET criteria, using the distal internal carotid diameter as the denominator. CONTRAST:  76mL OMNIPAQUE IOHEXOL 350 MG/ML SOLN COMPARISON:  CTA head and neck earlier today FINDINGS: CTA NECK FINDINGS Aortic arch: Standard 3 vessel aortic arch with mild-to-moderate calcified plaque. No evidence of significant arch vessel origin stenosis. 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 Left carotid system: Patent with mild calcified plaque in the mid common carotid artery and at the carotid bifurcation. No evidence of significant stenosis or dissection. Vertebral arteries: Calcified plaque results in unchanged mild stenosis of the proximal right subclavian artery. The vertebral arteries remain patent with unchanged mild stenosis of both origins due to calcified plaque. The left vertebral artery is mildly dominant. Skeleton: Moderate cervical disc and facet degeneration. Other neck: No evidence of cervical lymphadenopathy or mass. Post radiation changes in the neck. Upper chest: Scarring or post radiation fibrosis in the lung apices. Review of the MIP images confirms the above findings CTA HEAD FINDINGS Anterior circulation: The intracranial right ICA is occluded  proximally with reconstitution of the ophthalmic segment through the terminus. There is an early bifurcation of the right MCA with a new filling defect at the bifurcation consistent with an embolus. The origin of the right M2 superior division is narrowed but remains patent. The right M2 inferior division is occluded over approximately 6 mm long segment beginning at the MCA bifurcation with subsequent reconstitution. There is also occlusion of some small right  MCA branches more distally compared to today's earlier CTA. The left MCA and both ACAs remain patent with branch vessel irregularity but no evidence of a significant proximal stenosis. No aneurysm is identified. Posterior circulation: The intracranial vertebral arteries are patent to the basilar with atherosclerosis resulting in mild luminal irregularity and at most mild narrowing bilaterally. Patent PICA and SCA origins are identified bilaterally. The basilar artery is widely patent. There is a small right posterior communicating artery. Both PCAs are patent with mild atherosclerotic irregularity but no evidence of a significant proximal stenosis. No aneurysm is identified. Venous sinuses: Patent. Anatomic variants: None. Review of the MIP images confirms the above findings IMPRESSION: 1. New embolus at the right MCA bifurcation with short segment occlusion of the M2 inferior division, narrowing of the origin of the M2 superior division, and occlusion of some more distal MCA branch vessels. 2. 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 3. Mild bilateral vertebral artery origin stenoses. 4. Left-sided carotid atherosclerosis without significant stenosis. 5.  Aortic Atherosclerosis (ICD10-I70.0). These results were communicated to Dr. Lorrin Goodell at 6:24 pm on 12/19/2020 by text page via the [facility] messaging system. Electronically Signed   By: Logan Bores M.D.   On:  12/19/2020 18:38   CT ANGIO NECK CODE STROKE  Result Date: 12/19/2020 CLINICAL DATA:  Left facial droop. EXAM: CT ANGIOGRAPHY HEAD AND NECK TECHNIQUE: Multidetector CT imaging of the head and neck was performed using the standard protocol during bolus administration of intravenous contrast. Multiplanar CT image reconstructions and MIPs were obtained to evaluate the vascular anatomy. Carotid stenosis measurements (when applicable) are obtained utilizing NASCET criteria, using the distal internal carotid diameter as the denominator. CONTRAST:  69mL OMNIPAQUE IOHEXOL 350 MG/ML SOLN COMPARISON:  CTA head and neck earlier today FINDINGS: CTA NECK FINDINGS Aortic arch: Standard 3 vessel aortic arch with mild-to-moderate calcified plaque. No evidence of significant arch vessel origin stenosis. 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 Left carotid system: Patent with mild calcified plaque in the mid common carotid artery and at the carotid bifurcation. No evidence of significant stenosis or dissection. Vertebral arteries: Calcified plaque results in unchanged mild stenosis of the proximal right subclavian artery. The vertebral arteries remain patent with unchanged mild stenosis of both origins due to calcified plaque. The left vertebral artery is mildly dominant. Skeleton: Moderate cervical disc and facet degeneration. Other neck: No evidence of cervical lymphadenopathy or mass. Post radiation changes in the neck. Upper chest: Scarring or post radiation fibrosis in the lung apices. 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 There is an early bifurcation of the right MCA with a new filling defect at the bifurcation consistent with an  embolus. The origin of the right M2 superior division is narrowed but remains patent. The right M2 inferior division is occluded over approximately 6 mm long segment beginning at the MCA bifurcation with subsequent reconstitution. There is also occlusion of some small right MCA branches more distally compared to today's earlier CTA. The left MCA and both ACAs remain patent with branch vessel irregularity but no evidence of a significant proximal stenosis. No aneurysm is identified. Posterior circulation: The intracranial vertebral arteries are patent to the  basilar with atherosclerosis resulting in mild luminal irregularity and at most mild narrowing bilaterally. Patent PICA and SCA origins are identified bilaterally. The basilar artery is widely patent. There is a small right posterior communicating artery. Both PCAs are patent with mild atherosclerotic irregularity but no evidence of a significant proximal stenosis. No aneurysm is identified. Venous sinuses: Patent. Anatomic variants: None. Review of the MIP images confirms the above findings IMPRESSION: 1. New embolus at the right MCA bifurcation with short segment occlusion of the M2 inferior division, narrowing of the origin of the M2 superior division, and occlusion of some more distal MCA branch vessels. 2. 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 3. Mild bilateral vertebral artery origin stenoses. 4. Left-sided carotid atherosclerosis without significant stenosis. 5.  Aortic Atherosclerosis (ICD10-I70.0). These results were communicated to Dr. Lorrin Goodell at 6:24 pm on 12/19/2020 by text page via the [facility] messaging system. Electronically Signed   By: Logan Bores M.D.   On: 12/19/2020 18:38    PHYSICAL EXAM  Temp:  [98 F (36.7 C)-98.3 F (36.8 C)] 98.1 F (36.7 C) (04/18 1125) Pulse Rate:  [74-84] 75 (04/18 0742) Resp:  [16-20] 20 (04/18 0742) BP:  (96-178)/(53-80) 133/80 (04/18 0742) SpO2:  [93 %-98 %] 98 % (04/18 1125)  General - well nourished, well developed, in no apparent distress.    Ophthalmologic - fundi not visualized due to noncooperation.    Cardiovascular - regular rhythm and rate  Neuro - awake, alert, eyes open, orientated to age, place, year and people, not orientated to month. No aphasia, fluent language, following all simple commands. Able to name and repeat and read. No gaze palsy, tracking bilaterally, visual field full, PERRL. No facial droop. Tongue midline. Bilateral UEs 5/5, no drift. Bilaterally LEs 5/5, no drift. Sensation symmetrical bilaterally, b/l FTN intact, walked with PT in the hallway, slow cautious gait, stooped posturing, but no hemiparetic gait.    ASSESSMENT/PLAN Mr. TYRESSE JAYSON is a 80 y.o. 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 Symptoms improved with elevation of BP, and currently at baseline. No tPA given due to recent stroke and symptoms resolved.   Stroke:  right BG small infarct, likely due to right ICA hypoperfusion iun the setting of severe orthostatic hypotension.   CT head 1cm hypodensity at the right putamen  CTA head and neck - Focal narrowing of the stent by approximately 50% at the level of the native bifurcation.  MRI right BG small infarct, small chronic cortical infarct within the left occipital lobe.  2D Echo  EF 50-55%  LDL 30  HgbA1c 6.3  Heparin subq for VTE prophylaxis  aspirin 81 mg daily and Brilinta (ticagrelor) 90 mg bid prior to admission, now on aspirin 81 mg daily and Brilinta (ticagrelor) 90 mg bid.   Patient counseled to be compliant with his  antithrombotic medications  Ongoing aggressive stroke risk factor management  Therapy recommendations:  none  Disposition:  Home today  Hx of stroke  12/19/20 with MRI showing 2 punctate right MCA infarcts. CTA head and neck showed right CCA and ICA occlusion but MCA patent. Then developed left arm weakness and left facial droop. Repeat CT  head and neck showed new right MCA bifurcation embolus. S/p thrombectomy with TICI3 reperfusion and Right ICA stenting.  MRI repeat showed a few more punctate infarct at right MCA territory.  EF 50 to 55%.  A1c 6.3, LDL 30.  Patient recovered well,  discharged on aspirin Brilinta and Crestor to outpatient PT/OT.  Orthostatic hypotension, persistent  Supine BP 114/80, HR 85 bpm, Sitting BP 104/73, HR 87 bpm, Standing BP 72/54, HR 115 bpm, Sitting BP post walk 97/83, HR 150 bpm . Avoid low BP . Pt follows with Dr. [place] for this and he has appointment in May this year . On salty drinks at home . Continue sleep with head of bed elevated - pt declined hospital bed . Continue TED hose  Long term BP goal normotensive  Hyperlipidemia  Home meds:  crestor 20   LDL 30, goal < 70  Now resumed crestor 20  Continue statin at discharge  Other Stroke Risk Factors  Advanced age  CAD status post CABG  Symptoms of OSA per wife - Dr. Virgina Jock PCP to follow up  Other Active Problems  CKD stage III, Cre 1.35->1.27  right renal artery stenosis  aortic stenosis  Dementia  tongue cancer with chronic dysphagia  Hospital day # 0  Neurology will sign off. Please call with questions. Pt will follow up with Dr. Felecia Shelling at [facility] in about 4 weeks. Thanks for the consult.   Rosalin Hawking, MD PhD Stroke Neurology 01/07/2021 12:14 PM    To contact Stroke Continuity provider, please refer to [email or web address] After hours, contact General Neurology

## 2021-01-07 NOTE — Plan of Care (Signed)
  Problem: Health Behavior/Discharge Planning: Goal: Ability to manage health-related needs will improve Outcome: Adequate for Discharge   Problem: Clinical Measurements: Goal: Ability to maintain clinical measurements within normal limits will improve Outcome: Adequate for Discharge Goal: Will remain free from infection Outcome: Adequate for Discharge Goal: Diagnostic test results will improve Outcome: Adequate for Discharge Goal: Respiratory complications will improve Outcome: Adequate for Discharge Goal: Cardiovascular complication will be avoided Outcome: Adequate for Discharge   Problem: Activity: Goal: Risk for activity intolerance will decrease Outcome: Adequate for Discharge   Problem: Nutrition: Goal: Adequate nutrition will be maintained Outcome: Adequate for Discharge   Problem: Elimination: Goal: Will not experience complications related to bowel motility Outcome: Adequate for Discharge Goal: Will not experience complications related to urinary retention Outcome: Adequate for Discharge   Problem: Pain Managment: Goal: General experience of comfort will improve Outcome: Adequate for Discharge   Problem: Safety: Goal: Ability to remain free from injury will improve Outcome: Adequate for Discharge   Problem: Skin Integrity: Goal: Risk for impaired skin integrity will decrease Outcome: Adequate for Discharge   Problem: Education: Goal: Knowledge of disease or condition will improve Outcome: Adequate for Discharge Goal: Knowledge of secondary prevention will improve Outcome: Adequate for Discharge Goal: Knowledge of patient specific risk factors addressed and post discharge goals established will improve Outcome: Adequate for Discharge Goal: Individualized Educational Video(s) Outcome: Adequate for Discharge   Problem: Coping: Goal: Will verbalize positive feelings about self Outcome: Adequate for Discharge Goal: Will identify appropriate support  needs Outcome: Adequate for Discharge

## 2021-01-09 ENCOUNTER — Other Ambulatory Visit: Payer: Self-pay | Admitting: *Deleted

## 2021-01-09 ENCOUNTER — Encounter: Payer: Self-pay | Admitting: *Deleted

## 2021-01-09 NOTE — Patient Instructions (Signed)
https://[email or web address]-care.[email or web address]">  Dementia Caregiver Guide Dementia is a term used to describe a number of symptoms that affect memory and thinking. The most common symptoms include:  Memory loss.  Trouble with language and communication.  Trouble concentrating.  Poor judgment and problems with reasoning.  Wandering from home or public places.  Extreme anxiety or depression.  Being suspicious or having angry outbursts and accusations.  Child-like behavior and language. Dementia can be frightening and confusing. And taking care of someone with dementia can be challenging. This guide provides tips to help you when providing care for a person with dementia. How to help manage lifestyle changes Dementia usually gets worse slowly over time. In the early stages, people with dementia can stay independent and safe with some help. In later stages, they need help with daily tasks such as dressing, grooming, and using the bathroom. There are actions you can take to help a person manage his or her life while living with this condition. Communicating  When the person is talking or seems frustrated, make eye contact and hold the person's hand.  Ask specific questions that need yes or no answers.  Use simple words, short sentences, and a calm voice. Only give one direction at a time.  When offering choices, limit the person to just one or two.  Avoid correcting the person in a negative way.  If the person is struggling to find the right words, gently try to help him or her. Preventing injury  Keep floors clear of clutter. Remove rugs, magazine racks, and floor lamps.  Keep hallways well lit, especially at night.  Put a handrail and nonslip mat in the bathtub or shower.  Put childproof locks on cabinets that contain dangerous items, such as medicines, alcohol, guns, toxic cleaning items, sharp tools or utensils, matches, and lighters.  For doors to the  outside of the house, put the locks in places where the person cannot see or reach them easily. This will help ensure that the person does not wander out of the house and get lost.  Be prepared for emergencies. Keep a list of emergency phone numbers and addresses in a convenient area.  Remove car keys and lock garage doors so that the person does not try to get in the car and drive.  Have the person wear a bracelet that tracks locations and identifies the person as having memory problems. This should be worn at all times for safety.   Helping with daily life  Keep the person on track with his or her routine.  Try to identify areas where the person may need help.  Be supportive, patient, calm, and encouraging.  Gently remind the person that adjusting to changes takes time.  Help with the tasks that the person has asked for help with.  Keep the person involved in daily tasks and decisions as much as possible.  Encourage conversation, but try not to get frustrated if the person struggles to find words or does not seem to appreciate your help.   How to recognize stress Look for signs of stress in yourself and in the person you are caring for. If you notice signs of stress, take steps to manage it. Symptoms of stress include:  Feeling anxious, irritable, frustrated, or angry.  Denying that the person has dementia or that his or her symptoms will not improve.  Feeling depressed, hopeless, or unappreciated.  Difficulty sleeping.  Difficulty concentrating.  Developing stress-related health problems.  Feeling like you have too little time  for your own life. Follow these instructions at home: Take care of your health Make sure that you and the person you are caring for:  Get regular sleep.  Exercise regularly.  Eat regular, nutritious meals.  Take over-the-counter and prescription medicines only as told by your health care providers.  Drink enough fluid to keep your urine pale  yellow.  Attend all scheduled health care appointments.   General instructions  Join a support group with others who are caregivers.  Ask about respite care resources. Respite care can provide short-term care for the person so that you can have a regular break from the stress of caregiving.  Consider any safety risks and take steps to avoid them.  Organize medicines in a pill box for each day of the week.  Create a plan to handle any legal or financial matters. Get legal or financial advice if needed.  Keep a calendar in a central location to remind the person of appointments or other activities. Where to find support: Many individuals and organizations offer support. These include:  Support groups for people with dementia.  Support groups for caregivers.  Counselors or therapists.  Home health care services.  Adult day care centers. Where to find more information  Centers for Disease Control and Prevention: http://www.wolf.info/  Alzheimer's Association: [place]Mile.co.nz  Family Caregiver Alliance: www.caregiver.[place]: www.alzfdn.org Contact a health care provider if:  The person's health is rapidly getting worse.  You are no longer able to care for the person.  Caring for the person is affecting your physical and emotional health.  You are feeling depressed or anxious about caring for the person. Get help right away if:  The person threatens himself or herself, you, or anyone else.  You feel depressed or sad, or feel that you want to harm yourself. If you ever feel like your loved one may hurt himself or herself or others, or if he or she shares thoughts about taking his or her own life, get help right away. You can go to your nearest emergency department or:  Call your local emergency services (911 in the [place]  Call a suicide crisis helpline, such as the [place] at 914-680-2487. This is open 24 hours a day in  the [place]  Text the Crisis Text Line at 773-264-0673 (in the [place].). Summary  Dementia is a term used to describe a number of symptoms that affect memory and thinking.  Dementia usually gets worse slowly over time.  Take steps to reduce the person's risk of injury and to plan for future care.  Caregivers need support, relief from caregiving, and time for their own lives. This information is not intended to replace advice given to you by your health care provider. Make sure you discuss any questions you have with your health care provider. Document Revised: 01/23/2020 Document Reviewed: 01/23/2020 Elsevier Patient Education  [place].

## 2021-01-09 NOTE — Patient Outreach (Signed)
[facility]) Care Management  [place]  01/09/2021   RAJA LISKA 03-06-1941 [id number]   Noted that member has been readmitted twice since last conversation.  Outgoing call placed to member's wife to discuss involvement with [facility].  Identity verified.  This care manager introduced self and stated purpose of call.  [facility] care management services explained.     Social: He lives with wife only, has adult children in the area however there is limited support from them as they work during the day.  He is able to bathe and dress on his own but she report he does have some leg weakness and dizziness with the orthostatic hypotension.  He has a walker and a cane but refuses to use it.  She is also having a hard time restricting him from driving.  Will discuss with PCP and potentially have license taken away.    She expresses frustration regarding being the caregiver of a dementia patient, stating he comes to her and want her to "fix everything," becoming upset when she isn't able to manage him. Does not feel he would agree to having an aide come into the home nor would he agree to go to senior center for day care.  This care manager inquired about involvement with palliative care and other support groups, she is open to more information.   Conditions: Per chart, has history of stroke, HTN, Dysphagia, Hypothyroidism, Dementia, and HLD.  Wife thinks he may have sleep apnea, will discuss with provider during visit and recommend sleep study.  Medications:  Reviewed with wife, denies any need for financial assistance.  She is concerned that the medications could be causing side effects as the member intermittently have night terrors.  He also has been suffering from orthostatic hypotension.  Member is taking Ativan 3 times a day, wife and family feel this may having an affect on his condition.  He refuses to stop taking it until something else can replace it.  She agrees to medication  review by pharmacy team.  Appointments: Member has appointment with PCP on 5/3 and with cardiology on 5/16, wife will provide transportation.  Does not have follow up with neurology yet, wife will call to schedule.  Advance Directives: Member has POA and living will, wife declines desire to change.  Consent: Wife grants permission for [facility] involvement.   Encounter Medications:  Outpatient Encounter Medications as of 01/09/2021  Medication Sig  . amitriptyline (ELAVIL) 25 MG tablet TAKE 1 TABLET(25 MG) BY MOUTH AT BEDTIME (Patient taking differently: Take 25 mg by mouth at bedtime as needed for sleep.)  . amLODipine (NORVASC) 5 MG tablet Take 0.5 tablets (2.5 mg total) by mouth daily as needed (blood pressure on the higher end, no parameter).  Marland Kitchen aspirin EC 81 MG EC tablet Take 1 tablet (81 mg total) by mouth daily. Swallow whole.  . donepezil (ARICEPT) 5 MG tablet Take 5 mg by mouth at bedtime.  . Evolocumab (REPATHA SURECLICK) 989 MG/ML SOAJ Inject 140 mg into the skin every 14 (fourteen) days.  . fludrocortisone (FLORINEF) 0.1 MG tablet Take 0.1 mg by mouth daily.  Marland Kitchen gabapentin (NEURONTIN) 300 MG capsule Take 3 capsules (900 mg total) by mouth 3 (three) times daily.  Marland Kitchen levothyroxine (SYNTHROID, LEVOTHROID) 100 MCG tablet TAKE ONE TABLET BY MOUTH ONCE DAILY BEFORE  BREAKFAST (Patient taking differently: Take 100 mcg by mouth daily before breakfast.)  . LORazepam (ATIVAN) 1 MG tablet TAKE 1 TABLET BY MOUTH THREE TIMES  DAILY (Patient taking differently: Take 1 mg by mouth 3 (three) times daily.)  . pilocarpine (SALAGEN) 7.5 MG tablet Take 7.5 mg by mouth 3 (three) times daily.  . rosuvastatin (CRESTOR) 20 MG tablet TAKE 1 TABLET BY MOUTH DAILY (Patient taking differently: Take 20 mg by mouth daily.)  . ticagrelor (BRILINTA) 90 MG TABS tablet Take 1 tablet (90 mg total) by mouth 2 (two) times daily.  . traMADol (ULTRAM) 50 MG tablet Take 50 mg by mouth 2 (two) times daily as needed for moderate  pain.   . busPIRone (BUSPAR) 5 MG tablet Take 5 mg by mouth 2 (two) times daily as needed (anxiety).  (Patient not taking: Reported on 01/09/2021)   No facility-administered encounter medications on file as of 01/09/2021.    Functional Status:  In your present state of health, do you have any difficulty performing the following activities: 01/07/2021 12/28/2020  Hearing? Y Y  Comment left ear L ear  Vision? N Y  Difficulty concentrating or making decisions? Y Y  Comment - -  Walking or climbing stairs? N N  Dressing or bathing? Y N  Doing errands, shopping? N N  Some recent data might be hidden    Fall/Depression Screening: Fall Risk  01/09/2021 07/22/2017 06/02/2017  Falls in the past year? 0 No No  Number falls in past yr: 0 - -  Injury with Fall? 0 - -  Risk for fall due to : Impaired balance/gait;Impaired vision;Mental status change - -   PHQ 2/9 Scores 01/09/2021 11/11/2011  PHQ - 2 Score - 0  Exception Documentation Medical reason -    Assessment:  Goals Addressed            This Visit's Progress   . Find Help in My Community       Timeframe:  Short-Term Goal Priority:  High Start Date:           4/20                  Expected End Date:  5/20                     Follow Up Date 5/4   - follow-up on any referrals for help I am given    Why is this important?    Knowing how and where to find help for yourself or family in your neighborhood and community is an important skill.   You will want to take some steps to learn how.    Notes:   4/20 - Referral to [facility] for [facility]iative [facility]    . Keep or Improve My Strength-Stroke       Timeframe:  Long-Range Goal Priority:  Medium Start Date:          4/20                   Expected End Date:   6/20                      Follow Up Date 5/4    - arrange in-home help services - increase activity or exercise time a little every week - know who to call for help if I fall    Why is this important?    Before  the stroke you probably did not think much about being safe when you are up and about.   Now, it may be harder for you to get around.  It may also be easier for you to trip or fall.   It is common to have muscle weakness after a stroke. You may also feel like you cannot control an arm or leg.   It will be helpful to work with a physical therapist to get your strength and muscle control back.   It is good to stay as active as you can. Walking and stretching help you stay strong and flexible.   The physical therapist will develop an exercise program just for you.     Notes:   4/20 - Discussed the possibility of having home health PT for member, not ordered at this time but wife will consider    . Make and Keep All Appointments       Timeframe:  Short-Term Goal Priority:  High Start Date:           4/20                  Expected End Date:   5/20                    Follow Up Date 5/4   - call to cancel if needed - keep a calendar with appointment dates    Why is this important?    Part of staying healthy is seeing the doctor for follow-up care.   If you forget your appointments, there are some things you can do to stay on track.    Notes:   4/20 - Conference call placed to Neurology office in attempt to schedule visit, unsuccessful, wife will call back       Plan:  Follow-up:  Patient agrees to Care Plan and Follow-up.  Will send education regarding stroke recovery and dementia support.  Will place referrals to [facility] and [place] team.  Will notify PCP of [facility] involvement.  Will follow up with wife within the next 2 weeks.  Valente David, [place], MSN [place] [phone]

## 2021-01-14 ENCOUNTER — Other Ambulatory Visit: Payer: Self-pay | Admitting: *Deleted

## 2021-01-14 ENCOUNTER — Other Ambulatory Visit: Payer: Self-pay | Admitting: Hematology and Oncology

## 2021-01-14 NOTE — Patient Outreach (Signed)
[facility]) Care Management  01/14/2021  BERDELL HOSTETLER [birth year] [id number]   Member with multiple admissions in the last month, case discussed with multidisciplinary team.  Current plan of care is to continue with [facility] care management services and provide support to member and wife.  Outgoing call placed to PCP office to discuss wife's concern with being able to manage member and him being adamant that he is still able to drive.  Message left for nurse to call this care manager back.  Referral also placed [place] team to review medications for possible side effects and changes.  Will follow up with member's wife within the next week as planned.  Valente David, [place], MSN [place] [phone]

## 2021-01-16 ENCOUNTER — Other Ambulatory Visit: Payer: Self-pay

## 2021-01-16 MED ORDER — ROSUVASTATIN CALCIUM 20 MG PO TABS: 20.0000 mg | ORAL_TABLET | Freq: Every day | ORAL | 3 refills | Status: DC

## 2021-01-21 ENCOUNTER — Inpatient Hospital Stay (HOSPITAL_COMMUNITY)
Admission: EM | Admit: 2021-01-21 | Discharge: 2021-01-24 | DRG: 871 | Disposition: A | Discharge status: Home / Self Care | Payer: Medicare Other | Attending: Internal Medicine | Admitting: Internal Medicine

## 2021-01-21 ENCOUNTER — Emergency Department (HOSPITAL_COMMUNITY): Payer: Medicare Other

## 2021-01-21 ENCOUNTER — Encounter (HOSPITAL_COMMUNITY): Payer: Self-pay | Admitting: *Deleted

## 2021-01-21 ENCOUNTER — Other Ambulatory Visit: Payer: Self-pay

## 2021-01-21 DIAGNOSIS — J690 Pneumonitis due to inhalation of food and vomit: Secondary | ICD-10-CM | POA: Diagnosis not present

## 2021-01-21 DIAGNOSIS — N400 Enlarged prostate without lower urinary tract symptoms: Secondary | ICD-10-CM | POA: Diagnosis present

## 2021-01-21 DIAGNOSIS — Z85818 Personal history of malignant neoplasm of other sites of lip, oral cavity, and pharynx: Secondary | ICD-10-CM

## 2021-01-21 DIAGNOSIS — F0390 Unspecified dementia without behavioral disturbance: Secondary | ICD-10-CM | POA: Diagnosis present

## 2021-01-21 DIAGNOSIS — K219 Gastro-esophageal reflux disease without esophagitis: Secondary | ICD-10-CM | POA: Diagnosis present

## 2021-01-21 DIAGNOSIS — I10 Essential (primary) hypertension: Secondary | ICD-10-CM | POA: Diagnosis present

## 2021-01-21 DIAGNOSIS — J189 Pneumonia, unspecified organism: Secondary | ICD-10-CM | POA: Diagnosis not present

## 2021-01-21 DIAGNOSIS — R000 Tachycardia, unspecified: Secondary | ICD-10-CM | POA: Diagnosis not present

## 2021-01-21 DIAGNOSIS — Z951 Presence of aortocoronary bypass graft: Secondary | ICD-10-CM | POA: Diagnosis not present

## 2021-01-21 DIAGNOSIS — I2510 Atherosclerotic heart disease of native coronary artery without angina pectoris: Secondary | ICD-10-CM | POA: Diagnosis present

## 2021-01-21 DIAGNOSIS — Z79899 Other long term (current) drug therapy: Secondary | ICD-10-CM

## 2021-01-21 DIAGNOSIS — G4700 Insomnia, unspecified: Secondary | ICD-10-CM | POA: Diagnosis present

## 2021-01-21 DIAGNOSIS — E785 Hyperlipidemia, unspecified: Secondary | ICD-10-CM | POA: Diagnosis present

## 2021-01-21 DIAGNOSIS — I69354 Hemiplegia and hemiparesis following cerebral infarction affecting left non-dominant side: Secondary | ICD-10-CM

## 2021-01-21 DIAGNOSIS — N179 Acute kidney failure, unspecified: Secondary | ICD-10-CM | POA: Diagnosis present

## 2021-01-21 DIAGNOSIS — R109 Unspecified abdominal pain: Secondary | ICD-10-CM | POA: Diagnosis not present

## 2021-01-21 DIAGNOSIS — Z923 Personal history of irradiation: Secondary | ICD-10-CM | POA: Diagnosis not present

## 2021-01-21 DIAGNOSIS — Z85810 Personal history of malignant neoplasm of tongue: Secondary | ICD-10-CM | POA: Diagnosis not present

## 2021-01-21 DIAGNOSIS — Z7189 Other specified counseling: Secondary | ICD-10-CM | POA: Diagnosis not present

## 2021-01-21 DIAGNOSIS — E890 Postprocedural hypothyroidism: Secondary | ICD-10-CM | POA: Diagnosis present

## 2021-01-21 DIAGNOSIS — R6520 Severe sepsis without septic shock: Secondary | ICD-10-CM | POA: Diagnosis not present

## 2021-01-21 DIAGNOSIS — Z20822 Contact with and (suspected) exposure to covid-19: Secondary | ICD-10-CM | POA: Diagnosis present

## 2021-01-21 DIAGNOSIS — Z515 Encounter for palliative care: Secondary | ICD-10-CM | POA: Diagnosis not present

## 2021-01-21 DIAGNOSIS — E860 Dehydration: Secondary | ICD-10-CM | POA: Diagnosis present

## 2021-01-21 DIAGNOSIS — J9601 Acute respiratory failure with hypoxia: Secondary | ICD-10-CM | POA: Diagnosis present

## 2021-01-21 DIAGNOSIS — Z9111 Patient's noncompliance with dietary regimen: Secondary | ICD-10-CM

## 2021-01-21 DIAGNOSIS — Z79890 Hormone replacement therapy (postmenopausal): Secondary | ICD-10-CM

## 2021-01-21 DIAGNOSIS — Z7982 Long term (current) use of aspirin: Secondary | ICD-10-CM | POA: Diagnosis not present

## 2021-01-21 DIAGNOSIS — Z66 Do not resuscitate: Secondary | ICD-10-CM | POA: Diagnosis present

## 2021-01-21 DIAGNOSIS — Z87891 Personal history of nicotine dependence: Secondary | ICD-10-CM

## 2021-01-21 DIAGNOSIS — E7800 Pure hypercholesterolemia, unspecified: Secondary | ICD-10-CM | POA: Diagnosis present

## 2021-01-21 DIAGNOSIS — Z7902 Long term (current) use of antithrombotics/antiplatelets: Secondary | ICD-10-CM

## 2021-01-21 DIAGNOSIS — E039 Hypothyroidism, unspecified: Secondary | ICD-10-CM | POA: Diagnosis present

## 2021-01-21 DIAGNOSIS — A419 Sepsis, unspecified organism: Principal | ICD-10-CM | POA: Diagnosis present

## 2021-01-21 LAB — CBC WITH DIFFERENTIAL/PLATELET
Abs Immature Granulocytes: 0.04 10*3/uL (ref 0.00–0.07)
Basophils Absolute: 0 10*3/uL (ref 0.0–0.1)
Basophils Relative: 0 %
Eosinophils Absolute: 0.1 10*3/uL (ref 0.0–0.5)
Eosinophils Relative: 1 %
HCT: 45.8 % (ref 39.0–52.0)
Hemoglobin: 14.6 g/dL (ref 13.0–17.0)
Immature Granulocytes: 0 %
Lymphocytes Relative: 9 %
Lymphs Abs: 0.9 10*3/uL (ref 0.7–4.0)
MCH: 32.4 pg (ref 26.0–34.0)
MCHC: 31.9 g/dL (ref 30.0–36.0)
MCV: 101.8 fL — ABNORMAL HIGH (ref 80.0–100.0)
Monocytes Absolute: 0.7 10*3/uL (ref 0.1–1.0)
Monocytes Relative: 7 %
Neutro Abs: 8.5 10*3/uL — ABNORMAL HIGH (ref 1.7–7.7)
Neutrophils Relative %: 83 %
Platelets: 176 10*3/uL (ref 150–400)
RBC: 4.5 MIL/uL (ref 4.22–5.81)
RDW: 14.1 % (ref 11.5–15.5)
WBC: 10.2 10*3/uL (ref 4.0–10.5)
nRBC: 0 % (ref 0.0–0.2)

## 2021-01-21 LAB — URINALYSIS, ROUTINE W REFLEX MICROSCOPIC
Bilirubin Urine: NEGATIVE
Glucose, UA: NEGATIVE mg/dL
Hgb urine dipstick: NEGATIVE
Ketones, ur: NEGATIVE mg/dL
Leukocytes,Ua: NEGATIVE
Nitrite: NEGATIVE
Protein, ur: NEGATIVE mg/dL
Specific Gravity, Urine: 1.009 (ref 1.005–1.030)
pH: 6 (ref 5.0–8.0)

## 2021-01-21 LAB — COMPREHENSIVE METABOLIC PANEL
ALT: 60 U/L — ABNORMAL HIGH (ref 0–44)
AST: 45 U/L — ABNORMAL HIGH (ref 15–41)
Albumin: 4.3 g/dL (ref 3.5–5.0)
Alkaline Phosphatase: 41 U/L (ref 38–126)
Anion gap: 9 (ref 5–15)
BUN: 22 mg/dL (ref 8–23)
CO2: 29 mmol/L (ref 22–32)
Calcium: 10 mg/dL (ref 8.9–10.3)
Chloride: 97 mmol/L — ABNORMAL LOW (ref 98–111)
Creatinine, Ser: 1.32 mg/dL — ABNORMAL HIGH (ref 0.61–1.24)
GFR, Estimated: 55 mL/min — ABNORMAL LOW (ref >60)
Glucose, Bld: 131 mg/dL — ABNORMAL HIGH (ref 70–99)
Potassium: 3.9 mmol/L (ref 3.5–5.1)
Sodium: 135 mmol/L (ref 135–145)
Total Bilirubin: 0.5 mg/dL (ref 0.3–1.2)
Total Protein: 8.7 g/dL — ABNORMAL HIGH (ref 6.5–8.1)

## 2021-01-21 LAB — LACTIC ACID, PLASMA
Lactic Acid, Venous: 1.6 mmol/L (ref 0.5–1.9)
Lactic Acid, Venous: 1.7 mmol/L (ref 0.5–1.9)

## 2021-01-21 LAB — RESP PANEL BY RT-PCR (FLU A&B, COVID) ARPGX2
Influenza A by PCR: NEGATIVE
Influenza B by PCR: NEGATIVE
SARS Coronavirus 2 by RT PCR: NEGATIVE

## 2021-01-21 LAB — PROTIME-INR
INR: 1 (ref 0.8–1.2)
Prothrombin Time: 12.7 seconds (ref 11.4–15.2)

## 2021-01-21 LAB — APTT: aPTT: 23 seconds — ABNORMAL LOW (ref 24–36)

## 2021-01-21 MED ORDER — SODIUM CHLORIDE 0.9 % IV SOLN: INTRAVENOUS | Status: AC

## 2021-01-21 MED ORDER — FLUDROCORTISONE ACETATE 0.1 MG PO TABS
0.1000 mg | ORAL_TABLET | Freq: Every day | ORAL | Status: DC
  Administered 2021-01-22 – 2021-01-24 (×3): 0.1 mg via ORAL
  Filled 2021-01-21 (×3): qty 1

## 2021-01-21 MED ORDER — AMITRIPTYLINE HCL 25 MG PO TABS
25.0000 mg | ORAL_TABLET | Freq: Every evening | ORAL | Status: DC | PRN
  Administered 2021-01-21: 25 mg via ORAL
  Filled 2021-01-21: qty 1

## 2021-01-21 MED ORDER — ACETAMINOPHEN 325 MG PO TABS
650.0000 mg | ORAL_TABLET | Freq: Once | ORAL | Status: AC
  Administered 2021-01-21: 650 mg via ORAL
  Filled 2021-01-21: qty 2

## 2021-01-21 MED ORDER — AMLODIPINE BESYLATE 5 MG PO TABS: 2.5000 mg | ORAL_TABLET | Freq: Every day | ORAL | Status: DC | PRN

## 2021-01-21 MED ORDER — TRAMADOL HCL 50 MG PO TABS
50.0000 mg | ORAL_TABLET | Freq: Two times a day (BID) | ORAL | Status: DC | PRN
  Administered 2021-01-22 – 2021-01-23 (×2): 50 mg via ORAL
  Filled 2021-01-21 (×2): qty 1

## 2021-01-21 MED ORDER — TICAGRELOR 90 MG PO TABS
90.0000 mg | ORAL_TABLET | Freq: Two times a day (BID) | ORAL | Status: DC
  Administered 2021-01-21 – 2021-01-24 (×6): 90 mg via ORAL
  Filled 2021-01-21 (×6): qty 1

## 2021-01-21 MED ORDER — HEPARIN SODIUM (PORCINE) 5000 UNIT/ML IJ SOLN
5000.0000 [IU] | Freq: Three times a day (TID) | INTRAMUSCULAR | Status: DC
  Administered 2021-01-21 – 2021-01-24 (×8): 5000 [IU] via SUBCUTANEOUS
  Filled 2021-01-21 (×8): qty 1

## 2021-01-21 MED ORDER — ROSUVASTATIN CALCIUM 20 MG PO TABS
20.0000 mg | ORAL_TABLET | Freq: Every day | ORAL | Status: DC
  Administered 2021-01-22 – 2021-01-24 (×3): 20 mg via ORAL
  Filled 2021-01-21 (×4): qty 1

## 2021-01-21 MED ORDER — ASPIRIN EC 81 MG PO TBEC
81.0000 mg | DELAYED_RELEASE_TABLET | Freq: Every day | ORAL | Status: DC
  Administered 2021-01-22 – 2021-01-24 (×3): 81 mg via ORAL
  Filled 2021-01-21 (×3): qty 1

## 2021-01-21 MED ORDER — EVOLOCUMAB 140 MG/ML ~~LOC~~ SOAJ: 140.0000 mg | SUBCUTANEOUS | Status: DC

## 2021-01-21 MED ORDER — QUETIAPINE FUMARATE 25 MG PO TABS
50.0000 mg | ORAL_TABLET | Freq: Every day | ORAL | Status: DC
  Administered 2021-01-22 – 2021-01-23 (×2): 50 mg via ORAL
  Filled 2021-01-21 (×2): qty 2

## 2021-01-21 MED ORDER — SODIUM CHLORIDE 0.9 % IV BOLUS
500.0000 mL | Freq: Once | INTRAVENOUS | Status: AC
  Administered 2021-01-21: 500 mL via INTRAVENOUS

## 2021-01-21 MED ORDER — VANCOMYCIN HCL 750 MG/150ML IV SOLN
750.0000 mg | Freq: Two times a day (BID) | INTRAVENOUS | Status: DC
  Administered 2021-01-22: 750 mg via INTRAVENOUS
  Filled 2021-01-21: qty 150

## 2021-01-21 MED ORDER — VANCOMYCIN HCL IN DEXTROSE 1-5 GM/200ML-% IV SOLN
1000.0000 mg | Freq: Once | INTRAVENOUS | Status: AC
  Administered 2021-01-21: 1000 mg via INTRAVENOUS
  Filled 2021-01-21: qty 200

## 2021-01-21 MED ORDER — METRONIDAZOLE 500 MG/100ML IV SOLN
500.0000 mg | Freq: Once | INTRAVENOUS | Status: AC
  Administered 2021-01-21: 500 mg via INTRAVENOUS
  Filled 2021-01-21: qty 100

## 2021-01-21 MED ORDER — SODIUM CHLORIDE 0.9 % IV SOLN
2.0000 g | Freq: Once | INTRAVENOUS | Status: AC
  Administered 2021-01-21: 2 g via INTRAVENOUS
  Filled 2021-01-21: qty 2

## 2021-01-21 MED ORDER — DONEPEZIL HCL 5 MG PO TABS
5.0000 mg | ORAL_TABLET | Freq: Every day | ORAL | Status: DC
  Administered 2021-01-21 – 2021-01-23 (×3): 5 mg via ORAL
  Filled 2021-01-21 (×3): qty 1

## 2021-01-21 MED ORDER — GABAPENTIN 300 MG PO CAPS
300.0000 mg | ORAL_CAPSULE | Freq: Three times a day (TID) | ORAL | Status: DC
  Administered 2021-01-21 – 2021-01-24 (×8): 300 mg via ORAL
  Filled 2021-01-21 (×8): qty 1

## 2021-01-21 MED ORDER — SODIUM CHLORIDE 0.9 % IV SOLN: 2.0000 g | INTRAVENOUS | Status: DC

## 2021-01-21 MED ORDER — BUSPIRONE HCL 5 MG PO TABS: 5.0000 mg | ORAL_TABLET | Freq: Two times a day (BID) | ORAL | Status: DC | PRN

## 2021-01-21 MED ORDER — SODIUM CHLORIDE 0.9 % IV BOLUS (SEPSIS)
1000.0000 mL | Freq: Once | INTRAVENOUS | Status: AC
  Administered 2021-01-21: 1000 mL via INTRAVENOUS

## 2021-01-21 MED ORDER — LORAZEPAM 2 MG/ML IJ SOLN
1.0000 mg | Freq: Four times a day (QID) | INTRAMUSCULAR | Status: DC | PRN
  Administered 2021-01-23 (×2): 1 mg via INTRAVENOUS
  Filled 2021-01-21 (×2): qty 1

## 2021-01-21 MED ORDER — LEVOTHYROXINE SODIUM 100 MCG PO TABS
100.0000 ug | ORAL_TABLET | Freq: Every day | ORAL | Status: DC
  Administered 2021-01-22 – 2021-01-24 (×3): 100 ug via ORAL
  Filled 2021-01-21 (×3): qty 1

## 2021-01-21 NOTE — ED Notes (Signed)
Blood cultures and lactic acid drawn at 1719.

## 2021-01-21 NOTE — Progress Notes (Signed)
Per bedside RN blood cultures drawn before antibiotics hung

## 2021-01-21 NOTE — ED Triage Notes (Signed)
Feeling bad for over a week, c/o abdominal pain

## 2021-01-21 NOTE — Progress Notes (Signed)
Notified bedside nurse of need to draw lactic acid and blood cultures.  

## 2021-01-21 NOTE — Progress Notes (Signed)
Elink following for code sepsis 

## 2021-01-21 NOTE — Progress Notes (Signed)
PHARMACY -  BRIEF ANTIBIOTIC NOTE   Pharmacy has received consult(s) for Vancomycin and Cefepime from an ED provider.  The patient's profile has been reviewed for ht/wt/allergies/indication/available labs.    One time order(s) placed for Cefepime 2gm and Vancomycin 1gm  Further antibiotics/pharmacy consults should be ordered by admitting physician if indicated.                       Thank you, Hart Robinsons A 01/21/2021  5:02 PM

## 2021-01-21 NOTE — ED Triage Notes (Signed)
Placed on oxygen at 4liters via cannula
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
## 2021-01-21 NOTE — Progress Notes (Signed)
Pharmacy Antibiotic Note  SYAIRE SABER is a 80 y.o. male admitted on 01/21/2021 with sepsis.  Pharmacy has been consulted for vancomycin and cefepime dosing.Vancomycin 1gm and cefepime 2gm given in ED  Plan: Continue cefepime 2gm IV q24 hours Continue vanc 750 mg IV q12 hours F/u renal function, cultures and clinical course  Height: 6\' 1"  (185.4 cm) Weight: 79 kg (174 lb 2.6 oz) IBW/kg (Calculated) : 79.9  Temp (24hrs), Avg:99.9 F (37.7 C), Min:99.5 F (37.5 C), Max:100.2 F (37.9 C)  Recent Labs  Lab 01/21/21 1719 01/21/21 1915  WBC 10.2  --   CREATININE 1.32*  --   LATICACIDVEN 1.6 1.7    Estimated Creatinine Clearance: 49.9 mL/min (A) (by C-G formula based on SCr of 1.32 mg/dL (H)).    Allergies  Allergen Reactions  . Zetia [Ezetimibe] Other (See Comments)    "made me feel bad"    Thank you for allowing pharmacy to be a part of this patient's care.  Beverlee Nims 01/21/2021 9:55 PM

## 2021-01-22 ENCOUNTER — Encounter (HOSPITAL_COMMUNITY): Payer: Self-pay | Admitting: Family Medicine

## 2021-01-22 ENCOUNTER — Other Ambulatory Visit: Payer: Self-pay | Admitting: *Deleted

## 2021-01-22 DIAGNOSIS — Z7189 Other specified counseling: Secondary | ICD-10-CM

## 2021-01-22 DIAGNOSIS — E7800 Pure hypercholesterolemia, unspecified: Secondary | ICD-10-CM

## 2021-01-22 DIAGNOSIS — R6520 Severe sepsis without septic shock: Secondary | ICD-10-CM

## 2021-01-22 DIAGNOSIS — E039 Hypothyroidism, unspecified: Secondary | ICD-10-CM

## 2021-01-22 DIAGNOSIS — Z515 Encounter for palliative care: Secondary | ICD-10-CM

## 2021-01-22 DIAGNOSIS — J9601 Acute respiratory failure with hypoxia: Secondary | ICD-10-CM

## 2021-01-22 DIAGNOSIS — A419 Sepsis, unspecified organism: Principal | ICD-10-CM

## 2021-01-22 DIAGNOSIS — J189 Pneumonia, unspecified organism: Secondary | ICD-10-CM

## 2021-01-22 LAB — EXPECTORATED SPUTUM ASSESSMENT W GRAM STAIN, RFLX TO RESP C

## 2021-01-22 LAB — HIV ANTIBODY (ROUTINE TESTING W REFLEX): HIV Screen 4th Generation wRfx: NONREACTIVE

## 2021-01-22 LAB — STREP PNEUMONIAE URINARY ANTIGEN: Strep Pneumo Urinary Antigen: NEGATIVE

## 2021-01-22 MED ORDER — VANCOMYCIN HCL 1250 MG/250ML IV SOLN
1250.0000 mg | INTRAVENOUS | Status: DC
  Administered 2021-01-22: 1250 mg via INTRAVENOUS
  Filled 2021-01-22: qty 250

## 2021-01-22 MED ORDER — SODIUM CHLORIDE 0.9 % IV SOLN
2.0000 g | Freq: Two times a day (BID) | INTRAVENOUS | Status: DC
  Administered 2021-01-22 (×2): 2 g via INTRAVENOUS
  Filled 2021-01-22 (×3): qty 2

## 2021-01-22 MED ORDER — AMLODIPINE BESYLATE 5 MG PO TABS
5.0000 mg | ORAL_TABLET | Freq: Every day | ORAL | Status: DC
  Administered 2021-01-22 – 2021-01-24 (×3): 5 mg via ORAL
  Filled 2021-01-22 (×3): qty 1

## 2021-01-22 MED ORDER — HYDRALAZINE HCL 20 MG/ML IJ SOLN: 10.0000 mg | INTRAMUSCULAR | Status: DC | PRN

## 2021-01-22 MED ORDER — ENSURE ENLIVE PO LIQD
237.0000 mL | Freq: Two times a day (BID) | ORAL | Status: DC
  Administered 2021-01-22 – 2021-01-24 (×4): 237 mL via ORAL

## 2021-01-22 NOTE — Progress Notes (Signed)
Initial Nutrition Assessment  DOCUMENTATION CODES:   Not applicable  INTERVENTION:  Magic cup daily with dinner meal, each supplement provides 290 kcal and 9 grams of protein   Ensure Enlive po daily with morning meds, each supplement provides 350 kcal and 20 grams of protein   Diet education   NUTRITION DIAGNOSIS:   Inadequate oral intake related to acute illness (history of swallow difficulty, recent aspiration PNA) as evidenced by per patient/family report,meal completion < 50%.   GOAL:  Patient will meet greater than or equal to 90% of their needs  MONITOR:  PO intake,Supplement acceptance,Labs,Weight trends  REASON FOR ASSESSMENT:   Consult Assessment of nutrition requirement/status  ASSESSMENT: Patient is an 80 yo male with hx of Dementia, CAD, GERD, hypotension. Multiple admissions this year. Palliative consult pending. He presents with shortness of breath.   Wife is requesting education/information about dysphagia 3 diet. Discussed with spouse and pt appropriate foods to include.     Patient ate 50% of breakfast (pancakes and sausage). Discussed patient at rounds and with nursing. Able to feed himself. Usual eating pattern 3 meals daily. He eats mainly soft foods but does like an occasional PBJ, and cold cereal.   Weight history: range between 77-82 kg the past 10 months. Currently 79 kg.   Medications: Aricept, Seroquel, Crestor  IVF- NS @ 150 ml/hr Vancomycin, Maxipime   Labs: BMP Latest Ref Rng & Units 01/21/2021 01/07/2021 01/06/2021  Glucose 70 - 99 mg/dL 131(H) 109(H) 168(H)  BUN 8 - 23 mg/dL 22 19 24(H)  Creatinine 0.61 - 1.24 mg/dL 1.32(H) 1.27(H) 1.35(H)  BUN/Creat Ratio 10 - 24 - - -  Sodium 135 - 145 mmol/L 135 139 139  Potassium 3.5 - 5.1 mmol/L 3.9 3.9 4.3  Chloride 98 - 111 mmol/L 97(L) 103 104  CO2 22 - 32 mmol/L 29 30 26   Calcium 8.9 - 10.3 mg/dL 10.0 9.3 8.9     NUTRITION - FOCUSED PHYSICAL EXAM: Unable to complete Nutrition-Focused  physical exam at this time.     Diet Order:   Diet Order            DIET DYS 3 Room service appropriate? Yes; Fluid consistency: Thin  Diet effective now                 EDUCATION NEEDS:  Education needs have been addressed (dysphagia 3 diet)  Skin:  Skin Assessment: Reviewed RN Assessment  Last BM:  5/2 type 4  Height:   Ht Readings from Last 1 Encounters:  01/21/21 6\' 1"  (1.854 m)    Weight:   Wt Readings from Last 1 Encounters:  01/21/21 79 kg    Ideal Body Weight:   84 kg  BMI:  Body mass index is 22.98 kg/m.  Estimated Nutritional Needs:   Kcal:  2000-2200  Protein:  99-103 gr  Fluid:  > 2 liters daily   Colman Cater MS,RD,CSG,LDN Contact: [email or web address]

## 2021-01-22 NOTE — Patient Outreach (Signed)
[place] [facility]) Care Management  01/22/2021  George Bradley [birth year] [id number]   Noted that member was readmitted yesterday, 4th time since March 30.  Hospital liaisons notified, request made to consider palliative care referral as this care manager discussed with member's wife during initial assessment.  Will follow up with member/wife pending disposition.  Valente David, [place], MSN [place] [phone]
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
## 2021-01-22 NOTE — Progress Notes (Signed)
Pt up with assist to bathroom. Gait steady. Pt had moderate soft Bernardy BM, voided yellow urine without difficulty. Pt asked to have O2 removed while OOB, pt has now been off O2 for 20 minutes and SaO2 is 98% on room air. Pt denies any SOB, resp even and non -labored. O2 left off for this time, will reevaluate. Advised pt to call if any increased SOB. Pt is coughing up [place] phlegm, expectorating. Pt ate breakfast without any diff swallowing.

## 2021-01-22 NOTE — Progress Notes (Signed)
Alert and oriented x 4. Admitted through the Ed from home.Denies pain or discomfort. Standby assist with ADLs. On supplemental oxygen with no s/s of distress. Skin intact with bruises to abdomen and arms. Tolerating IV fluids well. Continue plan of care.

## 2021-01-22 NOTE — Plan of Care (Signed)
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
## 2021-01-22 NOTE — H&P (Signed)
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 Electronically Signed   By: Donavan Foil M.D.   On: 01/21/2021 18:24       Assessment & Plan:    Principal Problem:   Sepsis ([place]) Active Problems:   Hypercholesterolemia   Hypothyroidism   Acute respiratory failure with hypoxia (HCC)   Pneumonia   1. Sepsis 2/2 pneumonia 1. HR 103, BP 82/52, R23, and respiratory failure 2. CXR shows Mild patchy  opacities at bases - pneumonia 3. Blood cultures pending 4. Sputum culture pending 5. Negative respiratory panel 6. Started on vanc/cefepime/flagyl 7. Continue Vanc/Cefepime 8. Hx of aspiration pneumonia - family  Has questions about dysphagia 3 diet. Re-consult SP 9. Continue to monitor 2. Acute respiratory failure with hypoxia 1. Secondary to above 2. Documented O2 sats down to 84% 3. Normalized with 2L [place] 4. Wean off O2 as tolerated 5. Continue to monitor 3. HTN 1. Continue norvasc, elavil 4. HLD 1. Continue Repatha and statin 5. Insomnia 1. Failed ativan, trazodone, melatonin and ambien at home 2. Start seroquel 3. LFTs are slightly elevated at 45 and 60.  4. Monitor LFTs closely 6. Transaminitis 1. AST 45, ALT 60 2. Likely secondary to dehydration 3. Continue fluids 4. Recheck in the AM 7. Thyroid disease 1. Continue synthroid 8.    DVT Prophylaxis-   heparin - SCDs   AM Labs Ordered, also please review Full Orders  Family Communication: Admission, patients condition and plan of care including tests being ordered have been discussed with the patient and Wife who indicate understanding and agree with the plan and Code Status.  Code Status:  DNR Admission status: Inpatient :The appropriate admission status for this patient is INPATIENT. Inpatient  status is judged to be reasonable and necessary in order to provide the required intensity of service to ensure the patient's safety. The patient's presenting symptoms, physical exam findings, and initial radiographic and laboratory data in the context of their chronic comorbidities is felt to place them at high risk for further clinical deterioration. Furthermore, it is not anticipated that the patient will be medically stable for discharge from the hospital within 2 midnights of admission. The following factors support the admission status of inpatient.     The patient's presenting symptoms include dyspnea The worrisome  physical exam findings include hypoxia The initial radiographic and laboratory data are worrisome because of CXR shows pneumonia The chronic co-morbidities include Dementia, HTN, HLD       * I certify that at the point of admission it is my clinical judgment that the patient will require inpatient hospital care spanning beyond 2 midnights from the point of admission due to high intensity of service, high risk for further deterioration and high frequency of surveillance required.*  Time spent in minutes : [place]

## 2021-01-22 NOTE — Progress Notes (Signed)
PROGRESS NOTE    Patient: George Bradley                            PCP: [name], MD                    DOB: 1941-09-19            DOA: 01/21/2021 [id number]             DOS: 01/22/2021, 12:57 PM   LOS: 1 day   Date of Service: The patient was seen and examined on 01/22/2021  Subjective:   The patient was seen and examined this morning. Awake alert oriented laying comfortably in bed, off oxygen Not complaining of shortness of breath or chest pain.  Wife present at bedside  Brief Narrative:   George Bradley  is a 80 y.o. male, with history of with history of dementia, neuropathy, hypothyroidism, hyperlipidemia, oropharyngeal cancer, GERD, coronary artery disease, and more presents to the ED with a chief complaint of dyspnea.  History of several hospitalization discharge April 18 postacute CVA, left-sided weakness, April 6 aspiration pneumonia,... Was supposed to be on dysphagia 3 diet noncompliant. Wife provided most history due to his dementia.  Also reported fluctuating blood pressure.   In the ED Temp 100.2, improved to 98.6, heart rate 61-103, respiratory rate 16-23, blood pressure 82/52 No leukocytosis with white blood cell count 10.2, hemoglobin 14.6 CMP within normal notes with exception of slight transaminitis with AST of 45 and ALT of 60. Negative respiratory panel Chest x-ray shows mild patchy opacities at bases, atelectasis versus mild pneumonia Lactic acid 1.7 Blood/urine culture obtained EKG shows a heart rate of 89, sinus rhythm, QTC 452 Patient was started on broad-spectrum antibiotics with vancomycin, cefepime, Flagyl      Assessment & Plan:   Principal Problem:   Sepsis ([place]) Active Problems:   Hypercholesterolemia   Hypothyroidism   Acute respiratory failure with hypoxia (HCC)   Pneumonia    1. Sepsis 2/2 pneumonia 1. Source of infection aspiration pneumonia 2. Vitals on admission HR 103, BP 82/52, R23, and respiratory failure 3. Much  improved today: temp 97.6, PR 68, RR 16, blood pressure 183/71, satting 98% on RA 4. CXR shows Mild patchy opacities at bases - pneumonia 5. Blood cultures >> 6. Sputum culture if able to obtain>> 7. Negative respiratory panel 8. Started on vanc/cefepime/flagyl >>> continue for now 9. Hx of aspiration pneumonia - family  Has questions about dysphagia 3 diet. 10.  Re-consult SP, and dietitian 11. Continue to monitor  2. Acute respiratory failure with hypoxia 1. Improved this morning, weaned down to room air, satting 98% 2. On admission:  documented O2 sats down to 84% 3. Supplemental oxygen as needed 4.  5. Continue to monitor 3. HTN 1. Continue norvasc,  2. Hypertensive, increasing dose of Norvasc 4. HLD 1. Continue Repatha and statin 2. Stable  5. Insomnia 1. Failed ativan, trazodone, melatonin and ambien at home 2. Start seroquel 3. LFTs are slightly elevated at 45 and 60.  4. Monitor LFTs closely 6. Transaminitis 1. AST 45, ALT 60 2. Likely secondary to dehydration 3. Continue fluids 4. Monitoring 7. Thyroid disease 1. Continue synthroid 8. History of CVA, with dysphagia  1.  Reconsulting speech for evaluation supposed to be on dysphagia 3 diet  2.  Consulting dietitian for dietary supplements 9. AKI  1.  Monitoring BUN/creatinine, continue gentle IV fluid hydration  2.  Avoiding nephrotoxins  10.  Dementia with ?  Behavior disturbances  1.  Continue home medication of Aricept, Seroquel nightly  2.  Thus far pleasant, cooperative       ----------------------------------------------------------------------------------------------------------------------------------------------- Nutritional status:  The patient's BMI is: Body mass index is 22.98 kg/m. I agree with the assessment and plan as outlined below:  Nutrition Problem: Inadequate oral intake Etiology: acute illness (history of swallow difficulty, recent aspiration PNA) Signs/Symptoms: per  patient/family report,meal completion < 50%     ---------------------------------------------------------------------------------------------------------------------------------------- Cultures; Blood Cultures x 2 >>>  Urine Culture  >>>  Sputum culture >>   Antimicrobials: IV antibiotics vancomycin cefepime >>>    Consultants: PT/speech/dietitian   ------------------------------------------------------------------------------------------------------------------------------------------------  DVT prophylaxis:  SCD/Compression stockings and Heparin SQ Code Status:   Code Status: DNR  Family Communication: No family member present at bedside- attempt will be made to update daily The above findings and plan of care has been discussed with patient (and family)  in detail,  they expressed understanding and agreement of above. -Advance care planning has been discussed.   Admission status:   Status is: Inpatient  Remains inpatient appropriate because:Inpatient level of care appropriate due to severity of illness   Dispo: The patient is from: Home              Anticipated d/c is to: Home w HH  in 2-3 days               Patient currently is not medically stable to d/c.   Difficult to place patient No      Level of care: Telemetry   Procedures:   No admission procedures for hospital encounter.     Antimicrobials:  Anti-infectives (From admission, onward)   Start     Dose/Rate Route Frequency Ordered Stop   01/22/21 1800  ceFEPIme (MAXIPIME) 2 g in sodium chloride 0.9 % 100 mL IVPB  Status:  Discontinued        2 g 200 mL/hr over 30 Minutes Intravenous Every 24 hours 01/21/21 2217 01/22/21 0830   01/22/21 1800  vancomycin (VANCOREADY) IVPB 1250 mg/250 mL        1,250 mg 166.7 mL/hr over 90 Minutes Intravenous Every 24 hours 01/22/21 0835     01/22/21 1000  ceFEPIme (MAXIPIME) 2 g in sodium chloride 0.9 % 100 mL IVPB        2 g 200 mL/hr over 30 Minutes Intravenous  Every 12 hours 01/22/21 0830     01/22/21 0600  vancomycin (VANCOREADY) IVPB 750 mg/150 mL  Status:  Discontinued        750 mg 150 mL/hr over 60 Minutes Intravenous Every 12 hours 01/21/21 2217 01/22/21 0835   01/21/21 1700  ceFEPIme (MAXIPIME) 2 g in sodium chloride 0.9 % 100 mL IVPB        2 g 200 mL/hr over 30 Minutes Intravenous  Once 01/21/21 1654 01/21/21 1809   01/21/21 1700  metroNIDAZOLE (FLAGYL) IVPB 500 mg        500 mg 100 mL/hr over 60 Minutes Intravenous  Once 01/21/21 1654 01/21/21 1932   01/21/21 1700  vancomycin (VANCOCIN) IVPB 1000 mg/200 mL premix        1,000 mg 200 mL/hr over 60 Minutes Intravenous  Once 01/21/21 1654 01/21/21 1845       Medication:  . amLODipine  5 mg Oral Daily  . aspirin EC  81 mg Oral Daily  . donepezil  5 mg Oral QHS  . Evolocumab  140 mg Subcutaneous Q14 Days  . feeding supplement  237 mL Oral BID BM  . fludrocortisone  0.1 mg Oral Daily  . gabapentin  300 mg Oral TID  . heparin  5,000 Units Subcutaneous Q8H  . levothyroxine  100 mcg Oral Q0600  . QUEtiapine  50 mg Oral QHS  . rosuvastatin  20 mg Oral Daily  . ticagrelor  90 mg Oral BID    amitriptyline, busPIRone, hydrALAZINE, LORazepam, traMADol   Objective:   Vitals:   01/21/21 2217 01/22/21 0212 01/22/21 0539 01/22/21 0828  BP: (!) 82/52 (!) 188/96 (!) 193/79 (!) 183/71  Pulse: 61 73 61 68  Resp: 16 18 18 16   Temp: 98.6 F (37 C) 97.6 F (36.4 C) 97.6 F (36.4 C)   TempSrc:      SpO2: 96% 100% 100% 98%  Weight:      Height:        Intake/Output Summary (Last 24 hours) at 01/22/2021 1257 Last data filed at 01/22/2021 [id number] Gross per 24 hour  Intake 4617.27 ml  Output 2000 ml  Net 2617.27 ml   Filed Weights   01/21/21 1743 01/21/21 2144  Weight: 80.7 kg 79 kg     Examination:   Physical Exam  Constitution:  Alert, cooperative, no distress,  Appears calm and comfortable  Psychiatric: Normal and stable mood and affect, cognition intact,   HEENT:  Normocephalic, PERRL, otherwise with in Normal limits  Chest:Chest symmetric Cardio vascular:  S1/S2, RRR, No murmure, No Rubs or Gallops  pulmonary: Clear to auscultation bilaterally, respirations unlabored, negative wheezes / crackles Abdomen: Soft, non-tender, non-distended, bowel sounds,no masses, no organomegaly Muscular skeletal: Limited exam - in bed, able to move all 4 extremities, Normal strength,  Neuro: CNII-XII intact. , normal motor and sensation, reflexes intact  Extremities: No pitting edema lower extremities, +2 pulses  Skin: Dry, warm to touch, negative for any Rashes, No open wounds Wounds: per nursing documentation    ------------------------------------------------------------------------------------------------------------------------------------------    LABs:  CBC Latest Ref Rng & Units 01/21/2021 01/07/2021 01/06/2021  WBC 4.0 - 10.5 K/uL 10.2 10.1 14.5(H)  Hemoglobin 13.0 - 17.0 g/dL 14.6 13.5 13.0  Hematocrit 39.0 - 52.0 % 45.8 41.3 40.5  Platelets 150 - 400 K/uL 176 252 222   CMP Latest Ref Rng & Units 01/21/2021 01/07/2021 01/06/2021  Glucose 70 - 99 mg/dL 131(H) 109(H) 168(H)  BUN 8 - 23 mg/dL 22 19 24(H)  Creatinine 0.61 - 1.24 mg/dL 1.32(H) 1.27(H) 1.35(H)  Sodium 135 - 145 mmol/L 135 139 139  Potassium 3.5 - 5.1 mmol/L 3.9 3.9 4.3  Chloride 98 - 111 mmol/L 97(L) 103 104  CO2 22 - 32 mmol/L 29 30 26   Calcium 8.9 - 10.3 mg/dL 10.0 9.3 8.9  Total Protein 6.5 - 8.1 g/dL 8.7(H) 7.0 7.6  Total Bilirubin 0.3 - 1.2 mg/dL 0.5 0.3 0.3  Alkaline Phos 38 - 126 U/L 41 37(L) 37(L)  AST 15 - 41 U/L 45(H) 37 34  ALT 0 - 44 U/L 60(H) 32 35       Micro Results Recent Results (from the past 240 hour(s))  Resp Panel by RT-PCR (Flu A&B, Covid) Nasopharyngeal Swab     Status: None   Collection Time: 01/21/21  5:03 PM   Specimen: Nasopharyngeal Swab; Nasopharyngeal(NP) swabs in vial transport medium  Result Value Ref Range Status   SARS Coronavirus 2 by RT PCR NEGATIVE  NEGATIVE Final    Comment: (NOTE) SARS-CoV-2 target nucleic acids are NOT DETECTED.  The SARS-CoV-2 RNA is generally detectable in upper respiratory specimens during the acute phase of infection. The lowest concentration of SARS-CoV-2 viral copies this assay can detect is 138 copies/mL. A negative result does not preclude SARS-Cov-2 infection and should not be used as the sole basis for treatment or other patient management decisions. A negative result may occur with  improper specimen collection/handling, submission of specimen other than nasopharyngeal swab, presence of viral mutation(s) within the areas targeted by this assay, and inadequate number of viral copies(<138 copies/mL). A negative result must be combined with clinical observations, patient history, and epidemiological information. The expected result is Negative.  Fact Sheet for Patients:  BloggerCourse.com  Fact Sheet for Healthcare Providers:  SeriousBroker.it  This test is no t yet approved or cleared by the [place] FDA and  has been authorized for detection and/or diagnosis of SARS-CoV-2 by FDA under an Emergency Use Authorization (EUA). This EUA will remain  in effect (meaning this test can be used) for the duration of the COVID-19 declaration under Section 564(b)(1) of the Act, 21 U.S.C.section 360bbb-3(b)(1), unless the authorization is terminated  or revoked sooner.       Influenza A by PCR NEGATIVE NEGATIVE Final   Influenza B by PCR NEGATIVE NEGATIVE Final    Comment: (NOTE) The Xpert Xpress SARS-CoV-2/FLU/RSV plus assay is intended as an aid in the diagnosis of influenza from Nasopharyngeal swab specimens and should not be used as a sole basis for treatment. Nasal washings and aspirates are unacceptable for Xpert Xpress SARS-CoV-2/FLU/RSV testing.  Fact Sheet for Patients: BloggerCourse.com  Fact Sheet for Healthcare  Providers: SeriousBroker.it  This test is not yet approved or cleared by the [place] FDA and has been authorized for detection and/or diagnosis of SARS-CoV-2 by FDA under an Emergency Use Authorization (EUA). This EUA will remain in effect (meaning this test can be used) for the duration of the COVID-19 declaration under Section 564(b)(1) of the Act, 21 U.S.C. section 360bbb-3(b)(1), unless the authorization is terminated or revoked.  Performed at [facility], [address], [place], [place] [address]   Blood Culture (routine x 2)     Status: None (Preliminary result)   Collection Time: 01/21/21  5:19 PM   Specimen: BLOOD  Result Value Ref Range Status   Specimen Description BLOOD RIGHT ARM  Final   Special Requests   Final    BOTTLES DRAWN AEROBIC AND ANAEROBIC Blood Culture adequate volume   Culture   Final    NO GROWTH < 24 HOURS Performed at [facility], [address] [place], [place] [address]    Report Status PENDING  Incomplete  Blood Culture (routine x 2)     Status: None (Preliminary result)   Collection Time: 01/21/21  5:19 PM   Specimen: BLOOD  Result Value Ref Range Status   Specimen Description BLOOD LEFT ARM  Final   Special Requests   Final    BOTTLES DRAWN AEROBIC AND ANAEROBIC Blood Culture adequate volume   Culture   Final    NO GROWTH < 24 HOURS Performed at [facility], [address], [place], [place] [address]    Report Status PENDING  Incomplete  Culture, sputum-assessment     Status: None   Collection Time: 01/22/21  5:07 AM   Specimen: Expectorated Sputum  Result Value Ref Range Status   Specimen Description EXPECTORATED SPUTUM  Final   Special Requests NONE  Final   Sputum evaluation   Final    THIS SPECIMEN  IS ACCEPTABLE FOR SPUTUM CULTURE Performed at [facility], [address] [place], [place] [address]    Report Status 01/22/2021 FINAL  Final    Radiology Reports CT Angio Head W or Wo  Contrast  Result Date: 01/06/2021 CLINICAL DATA:  Initial evaluation for acute stroke, left-sided weakness, now resolved. EXAM: CT ANGIOGRAPHY HEAD AND NECK CT PERFUSION BRAIN TECHNIQUE: Multidetector CT imaging of the head and neck was performed using the standard protocol during bolus administration of intravenous contrast. Multiplanar CT image reconstructions and MIPs were obtained to evaluate the vascular anatomy. Carotid stenosis measurements (when applicable) are obtained utilizing NASCET criteria, using the distal internal carotid diameter as the denominator. Multiphase CT imaging of the brain was performed following IV bolus contrast injection. Subsequent parametric perfusion maps were calculated using RAPID software. CONTRAST:  159mL OMNIPAQUE IOHEXOL 350 MG/ML SOLN COMPARISON:  Prior CT from earlier the same day as well as previous studies from 12/20/2020 and 12/19/2020. FINDINGS: CTA NECK FINDINGS Aortic arch: Visualized aortic arch normal in caliber with normal 3 vessel morphology. Moderate atheromatous change about the arch and origin of the great vessels without high-grade stenosis. Right carotid system: Right CCA is now widely patent from its origin to the bifurcation. Intravascular stent traverses the right bifurcation extending into the proximal right ICA. Moderate stenosis of the stent at the level of the native bifurcation of up to approximately 50%. Stent is otherwise widely patent without intraluminal thrombus or other complication. Right ICA patent distally to the skull base without stenosis, dissection or occlusion. Left carotid system: Left CCA tortuous proximally but is widely patent to the bifurcation without stenosis. Mild calcified plaque about the left bifurcation/proximal left ICA without significant stenosis. Left ICA patent distally to the skull base without stenosis, dissection or occlusion. Vertebral arteries: Both vertebral arteries arise from the subclavian arteries. Calcified  plaque at the origin of the right subclavian artery with associated mild stenosis again noted. Atheromatous plaque at the origins of both vertebral arteries M cells with associated mild stenosis noted as well, also stable. Left vertebral artery slightly dominant. Vertebral arteries otherwise remain widely patent within the neck without stenosis, dissection or occlusion. Skeleton: No acute osseous finding. No discrete or worrisome osseous lesions. Mild multilevel degenerative spondylosis and facet arthrosis without high-grade stenosis. Other neck: No other acute soft tissue abnormality within the neck. No mass or adenopathy. Post radiation changes again noted. Upper chest: Post radiation scarring noted at the upper lungs bilaterally. Visualized upper chest demonstrates no other acute finding. Review of the MIP images confirms the above findings CTA HEAD FINDINGS Anterior circulation: Petrous segments are now both widely patent. Atheromatous plaque throughout the carotid siphons with no more than mild stenosis at the para clinoid segments. A1 segments widely patent. Normal anterior communicating artery complex. Anterior cerebral arteries remain widely patent within the neck. No M1 stenosis or occlusion. Right M1 bifurcates early. No proximal MCA branch occlusion. Distal MCA branches well perfused and symmetric. Posterior circulation: Both V4 segments remain patent to the vertebrobasilar junction. Both PICA origins are patent and normal. Basilar widely patent to its distal aspect. Superior cerebellar arteries patent bilaterally. Both PCAs are primarily supplied via the basilar. Mild atheromatous irregularity within the PCAs bilaterally without significant stenosis. Venous sinuses: Grossly patent allowing for timing the contrast bolus. Anatomic variants: None significant.  No aneurysm. Review of the MIP images confirms the above findings CT Brain Perfusion Findings: ASPECTS: 9. IMPRESSION: CTA HEAD AND NECK IMPRESSION:  1. Negative CTA for emergent  large vessel occlusion. 2. Interval revascularization of the right common and internal carotid arteries since previous exam, with vascular stent in place across the right carotid bifurcation. Focal narrowing of the stent by approximately 50% at the level of the native bifurcation. Otherwise, widely patent flow seen through the stent. 3. 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 4. Mild bilateral vertebral artery origins stenoses, also stable. 5.  Aortic Atherosclerosis (ICD10-I70.0). CT PERFUSION IMPRESSION: The CT perfusion portion of this exam unfortunately failed, with no perfusion maps generated. Results were called by telephone at the time of interpretation on 01/06/2021 at 4:00 am to provider [facility] , who verbally acknowledged these results. Electronically Signed   By: Rise Mu M.D.   On: 01/06/2021 04:45   CT Angio Neck W and/or Wo Contrast  Result Date: 01/06/2021 CLINICAL DATA:  Initial evaluation for acute stroke, left-sided weakness, now resolved. EXAM: CT ANGIOGRAPHY HEAD AND NECK CT PERFUSION BRAIN TECHNIQUE: Multidetector CT imaging of the head and neck was performed using the standard protocol during bolus administration of intravenous contrast. Multiplanar CT image reconstructions and MIPs were obtained to evaluate the vascular anatomy. Carotid stenosis measurements (when applicable) are obtained utilizing NASCET criteria, using the distal internal carotid diameter as the denominator. Multiphase CT imaging of the brain was performed following IV bolus contrast injection. Subsequent parametric perfusion maps were calculated using RAPID software. CONTRAST:  OMNIPAQUE IOHEXOL 350 MG/ML SOLN COMPARISON:  Prior CT from earlier the same day as well as previous studies from 12/20/2020 and 12/19/2020. FINDINGS: CTA NECK FINDINGS Aortic arch: Visualized aortic arch normal in caliber with normal 3 vessel morphology.  Moderate atheromatous change about the arch and origin of the great vessels without high-grade stenosis. Right carotid system: Right CCA is now widely patent from its origin to the bifurcation. Intravascular stent traverses the right bifurcation extending into the proximal right ICA. Moderate stenosis of the stent at the level of the native bifurcation of up to approximately 50%. Stent is otherwise widely patent without intraluminal thrombus or other complication. Right ICA patent distally to the skull base without stenosis, dissection or occlusion. Left carotid system: Left CCA tortuous proximally but is widely patent to the bifurcation without stenosis. Mild calcified plaque about the left bifurcation/proximal left ICA without significant stenosis. Left ICA patent distally to the skull base without stenosis, dissection or occlusion. Vertebral arteries: Both vertebral arteries arise from the subclavian arteries. Calcified plaque at the origin of the right subclavian artery with associated mild stenosis again noted. Atheromatous plaque at the origins of both vertebral arteries M cells with associated mild stenosis noted as well, also stable. Left vertebral artery slightly dominant. Vertebral arteries otherwise remain widely patent within the neck without stenosis, dissection or occlusion. Skeleton: No acute osseous finding. No discrete or worrisome osseous lesions. Mild multilevel degenerative spondylosis and facet arthrosis without high-grade stenosis. Other neck: No other acute soft tissue abnormality within the neck. No mass or adenopathy. Post radiation changes again noted. Upper chest: Post radiation scarring noted at the upper lungs bilaterally. Visualized upper chest demonstrates no other acute finding. Review of the MIP images confirms the above findings CTA HEAD FINDINGS Anterior circulation: Petrous segments are now both widely patent. Atheromatous plaque throughout the carotid siphons with no more than  mild stenosis at the para clinoid segments. A1 segments widely patent. Normal anterior communicating artery complex. Anterior cerebral arteries remain widely patent within the neck. No M1 stenosis or occlusion. Right M1 bifurcates early. No proximal MCA branch  occlusion. Distal MCA branches well perfused and symmetric. Posterior circulation: Both V4 segments remain patent to the vertebrobasilar junction. Both PICA origins are patent and normal. Basilar widely patent to its distal aspect. Superior cerebellar arteries patent bilaterally. Both PCAs are primarily supplied via the basilar. Mild atheromatous irregularity within the PCAs bilaterally without significant stenosis. Venous sinuses: Grossly patent allowing for timing the contrast bolus. Anatomic variants: None significant.  No aneurysm. Review of the MIP images confirms the above findings CT Brain Perfusion Findings: ASPECTS: 9. IMPRESSION: CTA HEAD AND NECK IMPRESSION: 1. Negative CTA for emergent large vessel occlusion. 2. Interval revascularization of the right common and internal carotid arteries since previous exam, with vascular stent in place across the right carotid bifurcation. Focal narrowing of the stent by approximately 50% at the level of the native bifurcation. Otherwise, widely patent flow seen through the stent. 3. 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 4. Mild bilateral vertebral artery origins stenoses, also stable. 5.  Aortic Atherosclerosis (ICD10-I70.0). CT PERFUSION IMPRESSION: The CT perfusion portion of this exam unfortunately failed, with no perfusion maps generated. Results were called by telephone at the time of interpretation on 01/06/2021 at 4:00 am to provider [facility] , who verbally acknowledged these results. Electronically Signed   By: Jeannine Boga M.D.   On: 01/06/2021 04:45   MR BRAIN WO CONTRAST  Result Date: 01/06/2021 CLINICAL DATA:  Neuro deficit, acute, stroke suspected.  EXAM: MRI HEAD WITHOUT CONTRAST TECHNIQUE: Multiplanar, multiecho pulse sequences of the brain and surrounding structures were obtained without intravenous contrast. COMPARISON:  Noncontrast head CT and CT angiogram head/neck as well as CT perfusion performed 01/06/2021. brain MRI 02/19/2021. FINDINGS: Brain: Mild intermittent motion degradation. Mild cerebral and cerebellar atrophy. There is a 14 mm focus of restricted diffusion within the right internal capsule/lentiform nucleus compatible with acute infarction. Redemonstrated small chronic cortical infarct within the left occipital lobe (PCA vascular territory). Background mild multifocal T2/FLAIR hyperintensity within the cerebral white matter is nonspecific, but compatible with chronic small vessel ischemic disease. Redemonstrated small chronic infarct within the left cerebellar hemisphere. No evidence of intracranial mass. No chronic intracranial blood products. No extra-axial fluid collection. No midline shift. Vascular: Expected proximal arterial flow voids. Skull and upper cervical spine: No focal marrow lesion. Sinuses/Orbits: Visualized orbits show no acute finding. Trace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 eject out (silent aspiration) Pharyngeal- Thin Straw -- Pharyngeal -- Pharyngeal- Puree -- Pharyngeal -- Pharyngeal- Mechanical Soft -- Pharyngeal -- Pharyngeal- Regular Reduced epiglottic inversion;Reduced anterior laryngeal mobility;Reduced airway/laryngeal closure;Delayed swallow initiation-vallecula;Pharyngeal residue - valleculae;Pharyngeal residue - pyriform Pharyngeal -- Pharyngeal- Multi-consistency -- Pharyngeal -- Pharyngeal- Pill Reduced epiglottic inversion;Reduced anterior laryngeal mobility;Reduced airway/laryngeal closure;Delayed swallow initiation-vallecula;Pharyngeal residue - valleculae;Pharyngeal residue - pyriform;Pharyngeal residue - cp segment Pharyngeal -- Pharyngeal Comment --  CHL IP CERVICAL ESOPHAGEAL PHASE 12/28/2020 Cervical Esophageal Phase Impaired Pudding Teaspoon -- Pudding Cup -- Honey Teaspoon -- Honey Cup -- Nectar Teaspoon -- Nectar Cup -- Nectar Straw -- Thin Teaspoon -- Thin Cup -- Thin Straw -- Puree -- Mechanical Soft -- Regular -- Multi-consistency -- Pill Reduced cricopharyngeal relaxation Cervical Esophageal Comment -- Shanika I. Hardin Negus, [place], [place] Office number [phone] Pager Newell 12/28/2020, 2:21 PM              CT HEAD CODE STROKE WO CONTRAST  Result Date: 01/06/2021 CLINICAL  DATA:  Code stroke. Initial evaluation for acute left-sided weakness EXAM: CT HEAD WITHOUT CONTRAST TECHNIQUE: Contiguous axial images were obtained from the base of the skull through the vertex without intravenous contrast. COMPARISON:  Prior CT and MRI from 12/20/2020. FINDINGS: Brain: Cerebral volume within normal limits for age. No acute intracranial hemorrhage. There is a new 1 cm hypodensity at the right lentiform nucleus/putamen, consistent with a small ischemic infarct, acute to subacute in appearance. This is new as compared to previous MRI and CT from 12/20/2020. No other evidence for acute  large vessel territory infarct. No mass lesion, midline shift or mass effect. No hydrocephalus or extra-axial fluid collection. 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 Vascular: No visible hyperdense vessel. Skull: Scalp soft tissues and calvarium within normal limits. Sinuses/Orbits: Globes and orbital soft tissues demonstrate no acute finding. Paranasal sinuses are largely clear. No mastoid effusion. Other: None. ASPECTS ([place] Stroke Program Early CT Score) - Ganglionic level infarction (caudate, lentiform nuclei, internal capsule, insula, M1-M3 cortex): 6 - Supraganglionic infarction (M4-M6 cortex): 3 Total score (0-10 with 10 being normal): 9 IMPRESSION: 1. 1 cm hypodensity at the right putamen, somewhat age indeterminate, but could reflect an acute to subacute ischemic infarct. This is new as compared to recent MRI and CT from 12/20/2020. No intracranial hemorrhage. 2. ASPECTS is 9. Critical Value/emergent results were called by telephone at the time of interpretation on 01/06/2021 at 3:24 am to provider [facility] , who verbally acknowledged these results. Electronically Signed   By: Jeannine Boga M.D.   On: 01/06/2021 03:29    SIGNED: Deatra Deval, MD, FHM. [facility],  Pager (please use [email or web address] to page/text) Please use Epic Secure Chat for non-urgent communication (7AM-7PM)  If 7PM-7AM, please contact night-coverage [email or web address], 01/22/2021, 12:57 PM

## 2021-01-23 ENCOUNTER — Ambulatory Visit: Payer: Self-pay | Admitting: *Deleted

## 2021-01-23 DIAGNOSIS — J690 Pneumonitis due to inhalation of food and vomit: Secondary | ICD-10-CM

## 2021-01-23 LAB — COMPREHENSIVE METABOLIC PANEL
ALT: 33 U/L (ref 0–44)
AST: 23 U/L (ref 15–41)
Albumin: 3.5 g/dL (ref 3.5–5.0)
Alkaline Phosphatase: 31 U/L — ABNORMAL LOW (ref 38–126)
Anion gap: 7 (ref 5–15)
BUN: 16 mg/dL (ref 8–23)
CO2: 30 mmol/L (ref 22–32)
Calcium: 9.4 mg/dL (ref 8.9–10.3)
Chloride: 102 mmol/L (ref 98–111)
Creatinine, Ser: 1 mg/dL (ref 0.61–1.24)
GFR, Estimated: >60 mL/min (ref >60)
Glucose, Bld: 109 mg/dL — ABNORMAL HIGH (ref 70–99)
Potassium: 3.9 mmol/L (ref 3.5–5.1)
Sodium: 139 mmol/L (ref 135–145)
Total Bilirubin: 0.6 mg/dL (ref 0.3–1.2)
Total Protein: 7.2 g/dL (ref 6.5–8.1)

## 2021-01-23 LAB — URINE CULTURE: Culture: NO GROWTH

## 2021-01-23 LAB — CBC
HCT: 38.4 % — ABNORMAL LOW (ref 39.0–52.0)
Hemoglobin: 12.5 g/dL — ABNORMAL LOW (ref 13.0–17.0)
MCH: 33.1 pg (ref 26.0–34.0)
MCHC: 32.6 g/dL (ref 30.0–36.0)
MCV: 101.6 fL — ABNORMAL HIGH (ref 80.0–100.0)
Platelets: 151 10*3/uL (ref 150–400)
RBC: 3.78 MIL/uL — ABNORMAL LOW (ref 4.22–5.81)
RDW: 14.5 % (ref 11.5–15.5)
WBC: 6.9 10*3/uL (ref 4.0–10.5)
nRBC: 0 % (ref 0.0–0.2)

## 2021-01-23 LAB — LEGIONELLA PNEUMOPHILA SEROGP 1 UR AG: L. pneumophila Serogp 1 Ur Ag: NEGATIVE

## 2021-01-23 LAB — MRSA PCR SCREENING: MRSA by PCR: NEGATIVE

## 2021-01-23 MED ORDER — SODIUM CHLORIDE 0.9 % IV SOLN
2.0000 g | Freq: Three times a day (TID) | INTRAVENOUS | Status: DC
  Administered 2021-01-23 – 2021-01-24 (×3): 2 g via INTRAVENOUS
  Filled 2021-01-23 (×2): qty 2

## 2021-01-23 NOTE — Progress Notes (Signed)
Patients blood pressure 91/72 and pulse 87. Patient given PRN Ativan at 1242. Patient reports no complaints of dizziness. Alert and verbal. Dr. Carles Collet made aware.

## 2021-01-23 NOTE — Progress Notes (Signed)
PROGRESS NOTE  George Bradley [id number] DOB: 03/11/1941 DOA: 01/21/2021 PCP: [name], MD  Brief History:  80 y.o.male,with history ofwith history of dementia, neuropathy, hypothyroidism, hyperlipidemia, oropharyngeal cancer, GERD, coronary artery disease, and more presents to the ED with a chief complaint of dyspnea.  History of several hospitalization discharge April 18 postacute CVA, left-sided weakness, April 6 aspiration pneumonia,... Was supposed to be on dysphagia 3 diet noncompliant. Wife provided most history due to his dementia.  Also reported fluctuating blood pressure.   In the ED Temp 100.2, improved to 98.6, heart rate 61-103, respiratory rate 16-23, blood pressure 82/52 No leukocytosis with white blood cell count 10.2, hemoglobin 14.6 CMP within normal notes with exception of slight transaminitis with AST of 45 and ALT of 60. Negative respiratory panel Chest x-ray shows mild patchy opacities at bases, atelectasis versus mild pneumonia Lactic acid 1.7 Blood/urine culture obtained EKG shows a heart rate of 89, sinus rhythm, QTC 452 Patient was started on broad-spectrum antibiotics with vancomycin, cefepime, Flagyl  Assessment/Plan: 1. Sepsis 2/2 pneumonia 1. Source of infection aspiration pneumonia 2. Vitals on admission HR 103, BP 82/52, R23, and respiratory failure;  Present on admission 3. CXR shows Mild patchy opacities at bases - pneumonia 4. Blood cultures >>neg to date 5. Sputum culture if able to obtain>> 6. Negative respiratory panel 7. Started on vanc/cefepime/flagyl >>> d/c vanc and metro 8. Hx of aspiration pneumonia - family Has questions about dysphagia 3 diet. 9.  Re-consult SP-->regular diet with thin liquids  2. Acute respiratory failure with hypoxia 1. Improved this morning, weaned down to room air, satting 98% 2. On admission:  documented O2 sats down to 84% 3. Supplemental oxygen as needed 4. Continue to  monitor 3. HTN 1. Continue norvasc,  2. Hypertensive, increasing dose of Norvasc 4. HLD 1. Continue Repatha and statin 2. Stable  5. Insomnia 1. Failed ativan, trazodone, melatonin and ambien at home 2. Start seroquel 3. LFTs are slightly elevated at 45 and 60.  4. Monitor LFTs closely--improved 6. Transaminitis 1. AST 45, ALT 60--due to sepsis 2. Likely secondary to dehydration 3. Continue fluids 4. improved 7. Hypothyroidism 1. Continue synthroid 8. History of CVA, with dysphagia             1.  Reconsulting speech for evaluation supposed to be on dysphagia 3 diet             2.  Consulting dietitian for dietary supplements 9. AKI             1.  Monitoring BUN/creatinine, continue gentle IV fluid hydration             2.  Continue ASA and Brillinta  10.  Dementia without  Behavior disturbances             1.  Continue home medication of Aricept, Seroquel nightly             2.  Thus far pleasant, cooperative    Status is: Inpatient  Remains inpatient appropriate because:IV treatments appropriate due to intensity of illness or inability to take PO   Dispo: The patient is from: Home              Anticipated d/c is to: Home              Patient currently is not medically stable to d/c.   Difficult to place patient No  Family Communication:  no Family at bedside  Consultants:  palliative  Code Status:  DNR  DVT Prophylaxis:  [place] Heparin   Procedures: As Listed in Progress Note Above  Antibiotics: None       Subjective: Patient denies fevers, chills, headache, chest pain, dyspnea, nausea, vomiting, diarrhea, abdominal pain, dysuria, hematuria, hematochezia, and melena.   Objective: Vitals:   01/22/21 2027 01/22/21 2205 01/23/21 1046 01/23/21 1510  BP:  (!) 172/76 (!) 170/97 91/72  Pulse:  72 80 87  Resp:  17  18  Temp:  98.2 F (36.8 C)  98.3 F (36.8 C)  TempSrc:  Oral  Oral  SpO2: 94% 95% 97% 96%  Weight:      Height:         Intake/Output Summary (Last 24 hours) at 01/23/2021 1737 Last data filed at 01/23/2021 1558 Gross per 24 hour  Intake 1170 ml  Output 300 ml  Net 870 ml   Weight change:  Exam:   General:  Pt is alert, follows commands appropriately, not in acute distress  HEENT: No icterus, No thrush, No neck mass, [place]/AT  Cardiovascular: RRR, S1/S2, no rubs, no gallops  Respiratory: bibasilar rales. No wheeze  Abdomen: Soft/+BS, non tender, non distended, no guarding  Extremities: No edema, No lymphangitis, No petechiae, No rashes, no synovitis   Data Reviewed: I have personally reviewed following labs and imaging studies Basic Metabolic Panel: Recent Labs  Lab 01/21/21 1719 01/23/21 0923  NA 135 139  K 3.9 3.9  CL 97* 102  CO2 29 30  GLUCOSE 131* 109*  BUN 22 16  CREATININE 1.32* 1.00  CALCIUM 10.0 9.4   Liver Function Tests: Recent Labs  Lab 01/21/21 1719 01/23/21 0923  AST 45* 23  ALT 60* 33  ALKPHOS 41 31*  BILITOT 0.5 0.6  PROT 8.7* 7.2  ALBUMIN 4.3 3.5   No results for input(s): LIPASE, AMYLASE in the last 168 hours. No results for input(s): AMMONIA in the last 168 hours. Coagulation Profile: Recent Labs  Lab 01/21/21 1719  INR 1.0   CBC: Recent Labs  Lab 01/21/21 1719 01/23/21 0923  WBC 10.2 6.9  NEUTROABS 8.5*  --   HGB 14.6 12.5*  HCT 45.8 38.4*  MCV 101.8* 101.6*  PLT 176 151   Cardiac Enzymes: No results for input(s): CKTOTAL, CKMB, CKMBINDEX, TROPONINI in the last 168 hours. BNP: Invalid input(s): POCBNP CBG: No results for input(s): GLUCAP in the last 168 hours. HbA1C: No results for input(s): HGBA1C in the last 72 hours. Urine analysis:    Component Value Date/Time   COLORURINE STRAW (A) 01/21/2021 1933   APPEARANCEUR CLEAR 01/21/2021 1933   LABSPEC 1.009 01/21/2021 1933   LABSPEC 1.005 06/25/2011 1502   PHURINE 6.0 01/21/2021 1933   GLUCOSEU NEGATIVE 01/21/2021 1933   HGBUR NEGATIVE 01/21/2021 1933   BILIRUBINUR NEGATIVE  01/21/2021 1933   BILIRUBINUR Negative 06/25/2011 [place] 01/21/2021 1933   PROTEINUR NEGATIVE 01/21/2021 1933   UROBILINOGEN 0.2 10/09/2011 1634   NITRITE NEGATIVE 01/21/2021 1933   LEUKOCYTESUR NEGATIVE 01/21/2021 1933   LEUKOCYTESUR Negative 06/25/2011 1502   Sepsis Labs: @LABRCNTIP (procalcitonin:4,lacticidven:4) ) Recent Results (from the past 240 hour(s))  Resp Panel by RT-PCR (Flu A&B, Covid) Nasopharyngeal Swab     Status: None   Collection Time: 01/21/21  5:03 PM   Specimen: Nasopharyngeal Swab; Nasopharyngeal(NP) swabs in vial transport medium  Result Value Ref Range Status   SARS Coronavirus 2 by RT PCR NEGATIVE NEGATIVE Final  Comment: (NOTE) SARS-CoV-2 target nucleic acids are NOT DETECTED.  The SARS-CoV-2 RNA is generally detectable in upper respiratory specimens during the acute phase of infection. The lowest concentration of SARS-CoV-2 viral copies this assay can detect is 138 copies/mL. A negative result does not preclude SARS-Cov-2 infection and should not be used as the sole basis for treatment or other patient management decisions. A negative result may occur with  improper specimen collection/handling, submission of specimen other than nasopharyngeal swab, presence of viral mutation(s) within the areas targeted by this assay, and inadequate number of viral copies(<138 copies/mL). A negative result must be combined with clinical observations, patient history, and epidemiological information. The expected result is Negative.  Fact Sheet for Patients:  EntrepreneurPulse.com.au  Fact Sheet for Healthcare Providers:  IncredibleEmployment.be  This test is no t yet approved or cleared by the [place] FDA and  has been authorized for detection and/or diagnosis of SARS-CoV-2 by FDA under an Emergency Use Authorization (EUA). This EUA will remain  in effect (meaning this test can be used) for the duration  of the COVID-19 declaration under Section 564(b)(1) of the Act, 21 U.S.C.section 360bbb-3(b)(1), unless the authorization is terminated  or revoked sooner.       Influenza A by PCR NEGATIVE NEGATIVE Final   Influenza B by PCR NEGATIVE NEGATIVE Final    Comment: (NOTE) The Xpert Xpress SARS-CoV-2/FLU/RSV plus assay is intended as an aid in the diagnosis of influenza from Nasopharyngeal swab specimens and should not be used as a sole basis for treatment. Nasal washings and aspirates are unacceptable for Xpert Xpress SARS-CoV-2/FLU/RSV testing.  Fact Sheet for Patients: EntrepreneurPulse.com.au  Fact Sheet for Healthcare Providers: IncredibleEmployment.be  This test is not yet approved or cleared by the [place] FDA and has been authorized for detection and/or diagnosis of SARS-CoV-2 by FDA under an Emergency Use Authorization (EUA). This EUA will remain in effect (meaning this test can be used) for the duration of the COVID-19 declaration under Section 564(b)(1) of the Act, 21 U.S.C. section 360bbb-3(b)(1), unless the authorization is terminated or revoked.  Performed at [facility], [address], [place], [place] [address]   Blood Culture (routine x 2)     Status: None (Preliminary result)   Collection Time: 01/21/21  5:19 PM   Specimen: BLOOD  Result Value Ref Range Status   Specimen Description BLOOD RIGHT ARM  Final   Special Requests   Final    BOTTLES DRAWN AEROBIC AND ANAEROBIC Blood Culture adequate volume   Culture   Final    NO GROWTH 2 DAYS Performed at [facility], [address] [place], [place] [address]    Report Status PENDING  Incomplete  Blood Culture (routine x 2)     Status: None (Preliminary result)   Collection Time: 01/21/21  5:19 PM   Specimen: BLOOD  Result Value Ref Range Status   Specimen Description BLOOD LEFT ARM  Final   Special Requests   Final    BOTTLES DRAWN AEROBIC AND ANAEROBIC Blood Culture  adequate volume   Culture   Final    NO GROWTH 2 DAYS Performed at [facility], [address], [place], [place] [address]    Report Status PENDING  Incomplete  Urine culture     Status: None   Collection Time: 01/21/21  7:33 PM   Specimen: In/Out Cath Urine  Result Value Ref Range Status   Specimen Description   Final    IN/OUT CATH URINE Performed at [facility], [address], [place], [place] [address]    Special Requests   Final    NONE Performed at [facility], [address], [place], [place] [address]    Culture   Final    NO GROWTH Performed at [facility], [place] [address] [place], [place] [address]    Report Status 01/23/2021 FINAL  Final  Culture, sputum-assessment     Status: None   Collection Time: 01/22/21  5:07 AM   Specimen: Expectorated Sputum  Result Value Ref Range Status   Specimen Description EXPECTORATED SPUTUM  Final   Special Requests NONE  Final   Sputum evaluation   Final    THIS SPECIMEN IS ACCEPTABLE FOR SPUTUM CULTURE Performed at [facility], [address] [place], [place] [address]    Report Status 01/22/2021 FINAL  Final  Culture, Respiratory w Gram Stain     Status: None (Preliminary result)   Collection Time: 01/22/21  5:07 AM  Result Value Ref Range Status   Specimen Description   Final    EXPECTORATED SPUTUM Performed at [facility], [address] [place], [place] [address]    Special Requests   Final    NONE Reflexed from [id number] Performed at [facility], [address], [place], [place] [address]    Gram Stain   Final    RARE SQUAMOUS EPITHELIAL CELLS PRESENT ABUNDANT WBC PRESENT, PREDOMINANTLY PMN ABUNDANT GRAM POSITIVE COCCI    Culture   Final    CULTURE REINCUBATED FOR BETTER GROWTH Performed at [facility], [place] [address], [place], [place] [address]    Report Status PENDING  Incomplete  MRSA PCR Screening     Status: None   Collection Time: 01/23/21  9:20 AM   Specimen: Nasopharyngeal  Result Value  Ref Range Status   MRSA by PCR NEGATIVE NEGATIVE Final    Comment:        The GeneXpert MRSA Assay (FDA approved for NASAL specimens only), is one component of a comprehensive MRSA colonization surveillance program. It is not intended to diagnose MRSA infection nor to guide or monitor treatment for MRSA infections. Performed at [facility], [address], [place],  [address]      Scheduled Meds: . amLODipine  5 mg Oral Daily  . aspirin EC  81 mg Oral Daily  . donepezil  5 mg Oral QHS  . feeding supplement  237 mL Oral BID BM  . fludrocortisone  0.1 mg Oral Daily  . gabapentin  300 mg Oral TID  . heparin  5,000 Units Subcutaneous Q8H  . levothyroxine  100 mcg Oral Q0600  . QUEtiapine  50 mg Oral QHS  . rosuvastatin  20 mg Oral Daily  . ticagrelor  90 mg Oral BID   Continuous Infusions: . ceFEPime (MAXIPIME) IV Stopped (01/23/21 1125)    Procedures/Studies: CT Angio Head W or Wo Contrast  Result Date: 01/06/2021 CLINICAL DATA:  Initial evaluation for acute stroke, left-sided weakness, now resolved. EXAM: CT ANGIOGRAPHY HEAD AND NECK CT PERFUSION BRAIN TECHNIQUE: Multidetector CT imaging of the head and neck was performed using the standard protocol during bolus administration of intravenous contrast. Multiplanar CT image reconstructions and MIPs were obtained to evaluate the vascular anatomy. Carotid stenosis measurements (when applicable) are obtained utilizing NASCET criteria, using the distal internal carotid diameter as the denominator. Multiphase CT imaging of the brain was performed following IV bolus contrast injection. Subsequent parametric perfusion maps were calculated using RAPID software. CONTRAST:  131mL  OMNIPAQUE IOHEXOL 350 MG/ML SOLN COMPARISON:  Prior CT from earlier the same day as well as previous studies from 12/20/2020 and 12/19/2020. FINDINGS: CTA NECK FINDINGS Aortic arch: Visualized aortic arch normal in caliber with normal 3 vessel morphology.  Moderate atheromatous change about the arch and origin of the great vessels without high-grade stenosis. Right carotid system: Right CCA is now widely patent from its origin to the bifurcation. Intravascular stent traverses the right bifurcation extending into the proximal right ICA. Moderate stenosis of the stent at the level of the native bifurcation of up to approximately 50%. Stent is otherwise widely patent without intraluminal thrombus or other complication. Right ICA patent distally to the skull base without stenosis, dissection or occlusion. Left carotid system: Left CCA tortuous proximally but is widely patent to the bifurcation without stenosis. Mild calcified plaque about the left bifurcation/proximal left ICA without significant stenosis. Left ICA patent distally to the skull base without stenosis, dissection or occlusion. Vertebral arteries: Both vertebral arteries arise from the subclavian arteries. Calcified plaque at the origin of the right subclavian artery with associated mild stenosis again noted. Atheromatous plaque at the origins of both vertebral arteries M cells with associated mild stenosis noted as well, also stable. Left vertebral artery slightly dominant. Vertebral arteries otherwise remain widely patent within the neck without stenosis, dissection or occlusion. Skeleton: No acute osseous finding. No discrete or worrisome osseous lesions. Mild multilevel degenerative spondylosis and facet arthrosis without high-grade stenosis. Other neck: No other acute soft tissue abnormality within the neck. No mass or adenopathy. Post radiation changes again noted. Upper chest: Post radiation scarring noted at the upper lungs bilaterally. Visualized upper chest demonstrates no other acute finding. Review of the MIP images confirms the above findings CTA HEAD FINDINGS Anterior circulation: Petrous segments are now both widely patent. Atheromatous plaque throughout the carotid siphons with no more than  mild stenosis at the para clinoid segments. A1 segments widely patent. Normal anterior communicating artery complex. Anterior cerebral arteries remain widely patent within the neck. No M1 stenosis or occlusion. Right M1 bifurcates early. No proximal MCA branch occlusion. Distal MCA branches well perfused and symmetric. Posterior circulation: Both V4 segments remain patent to the vertebrobasilar junction. Both PICA origins are patent and normal. Basilar widely patent to its distal aspect. Superior cerebellar arteries patent bilaterally. Both PCAs are primarily supplied via the basilar. Mild atheromatous irregularity within the PCAs bilaterally without significant stenosis. Venous sinuses: Grossly patent allowing for timing the contrast bolus. Anatomic variants: None significant.  No aneurysm. Review of the MIP images confirms the above findings CT Brain Perfusion Findings: ASPECTS: 9. IMPRESSION: CTA HEAD AND NECK IMPRESSION: 1. Negative CTA for emergent large vessel occlusion. 2. Interval revascularization of the right common and internal carotid arteries since previous exam, with vascular stent in place across the right carotid bifurcation. Focal narrowing of the stent by approximately 50% at the level of the native bifurcation. Otherwise, widely patent flow seen through the stent. 3. 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 4. Mild bilateral vertebral artery origins stenoses, also stable. 5.  Aortic Atherosclerosis (ICD10-I70.0). CT PERFUSION IMPRESSION: The CT perfusion portion of this exam unfortunately failed, with no perfusion maps generated. Results were called by telephone at the time of interpretation on 01/06/2021 at 4:00 am to provider [facility] , who verbally acknowledged these results. Electronically Signed   By: Jeannine Boga M.D.   On: 01/06/2021 04:45   CT Angio Neck W and/or Wo Contrast  Result Date: 01/06/2021  CLINICAL DATA:  Initial evaluation for  acute stroke, left-sided weakness, now resolved. EXAM: CT ANGIOGRAPHY HEAD AND NECK CT PERFUSION BRAIN TECHNIQUE: Multidetector CT imaging of the head and neck was performed using the standard protocol during bolus administration of intravenous contrast. Multiplanar CT image reconstructions and MIPs were obtained to evaluate the vascular anatomy. Carotid stenosis measurements (when applicable) are obtained utilizing NASCET criteria, using the distal internal carotid diameter as the denominator. Multiphase CT imaging of the brain was performed following IV bolus contrast injection. Subsequent parametric perfusion maps were calculated using RAPID software. CONTRAST:  139mL OMNIPAQUE IOHEXOL 350 MG/ML SOLN COMPARISON:  Prior CT from earlier the same day as well as previous studies from 12/20/2020 and 12/19/2020. FINDINGS: CTA NECK FINDINGS Aortic arch: Visualized aortic arch normal in caliber with normal 3 vessel morphology. Moderate atheromatous change about the arch and origin of the great vessels without high-grade stenosis. Right carotid system: Right CCA is now widely patent from its origin to the bifurcation. Intravascular stent traverses the right bifurcation extending into the proximal right ICA. Moderate stenosis of the stent at the level of the native bifurcation of up to approximately 50%. Stent is otherwise widely patent without intraluminal thrombus or other complication. Right ICA patent distally to the skull base without stenosis, dissection or occlusion. Left carotid system: Left CCA tortuous proximally but is widely patent to the bifurcation without stenosis. Mild calcified plaque about the left bifurcation/proximal left ICA without significant stenosis. Left ICA patent distally to the skull base without stenosis, dissection or occlusion. Vertebral arteries: Both vertebral arteries arise from the subclavian arteries. Calcified plaque at the origin of the right subclavian artery with associated mild  stenosis again noted. Atheromatous plaque at the origins of both vertebral arteries M cells with associated mild stenosis noted as well, also stable. Left vertebral artery slightly dominant. Vertebral arteries otherwise remain widely patent within the neck without stenosis, dissection or occlusion. Skeleton: No acute osseous finding. No discrete or worrisome osseous lesions. Mild multilevel degenerative spondylosis and facet arthrosis without high-grade stenosis. Other neck: No other acute soft tissue abnormality within the neck. No mass or adenopathy. Post radiation changes again noted. Upper chest: Post radiation scarring noted at the upper lungs bilaterally. Visualized upper chest demonstrates no other acute finding. Review of the MIP images confirms the above findings CTA HEAD FINDINGS Anterior circulation: Petrous segments are now both widely patent. Atheromatous plaque throughout the carotid siphons with no more than mild stenosis at the para clinoid segments. A1 segments widely patent. Normal anterior communicating artery complex. Anterior cerebral arteries remain widely patent within the neck. No M1 stenosis or occlusion. Right M1 bifurcates early. No proximal MCA branch occlusion. Distal MCA branches well perfused and symmetric. Posterior circulation: Both V4 segments remain patent to the vertebrobasilar junction. Both PICA origins are patent and normal. Basilar widely patent to its distal aspect. Superior cerebellar arteries patent bilaterally. Both PCAs are primarily supplied via the basilar. Mild atheromatous irregularity within the PCAs bilaterally without significant stenosis. Venous sinuses: Grossly patent allowing for timing the contrast bolus. Anatomic variants: None significant.  No aneurysm. Review of the MIP images confirms the above findings CT Brain Perfusion Findings: ASPECTS: 9. IMPRESSION: CTA HEAD AND NECK IMPRESSION: 1. Negative CTA for emergent large vessel occlusion. 2. Interval  revascularization of the right common and internal carotid arteries since previous exam, with vascular stent in place across the right carotid bifurcation. Focal narrowing of the stent by approximately 50% at the level of the native  bifurcation. Otherwise, widely patent flow seen through the stent. 3. 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 4. Mild bilateral vertebral artery origins stenoses, also stable. 5.  Aortic Atherosclerosis (ICD10-I70.0). CT PERFUSION IMPRESSION: The CT perfusion portion of this exam unfortunately failed, with no perfusion maps generated. Results were called by telephone at the time of interpretation on 01/06/2021 at 4:00 am to provider [facility] , who verbally acknowledged these results. Electronically Signed   By: Jeannine Boga M.D.   On: 01/06/2021 04:45   MR BRAIN WO CONTRAST  Result Date: 01/06/2021 CLINICAL DATA:  Neuro deficit, acute, stroke suspected. EXAM: MRI HEAD WITHOUT CONTRAST TECHNIQUE: Multiplanar, multiecho pulse sequences of the brain and surrounding structures were obtained without intravenous contrast. COMPARISON:  Noncontrast head CT and CT angiogram head/neck as well as CT perfusion performed 01/06/2021. brain MRI 02/19/2021. FINDINGS: Brain: Mild intermittent motion degradation. Mild cerebral and cerebellar atrophy. There is a 14 mm focus of restricted diffusion within the right internal capsule/lentiform nucleus compatible with acute infarction. Redemonstrated small chronic cortical infarct within the left occipital lobe (PCA vascular territory). Background mild multifocal T2/FLAIR hyperintensity within the cerebral white matter is nonspecific, but compatible with chronic small vessel ischemic disease. Redemonstrated small chronic infarct within the left cerebellar hemisphere. No evidence of intracranial mass. No chronic intracranial blood products. No extra-axial fluid collection. No midline shift. Vascular: Expected  proximal arterial flow voids. Skull and upper cervical spine: No focal marrow lesion. Sinuses/Orbits: Visualized orbits show no acute finding. Trace bilateral ethmoid sinus mucosal thickening. Other: Right mastoid effusion. IMPRESSION: 14 mm acute infarct within the right internal capsule/lentiform nucleus. Redemonstrated small chronic cortical infarct within the left occipital lobe. Stable background mild generalized parenchymal atrophy and mild cerebral white matter chronic small vessel ischemic disease. Redemonstrated small chronic infarct within the left cerebellar hemisphere. Mild bilateral ethmoid sinus mucosal thickening. Right mastoid effusion. Electronically Signed   By: Kellie Simmering DO   On: 01/06/2021 14:16   CT CHEST ABDOMEN PELVIS W CONTRAST  Result Date: 12/26/2020 CLINICAL DATA:  Abdominal pain, fever EXAM: CT CHEST, ABDOMEN, AND PELVIS WITH CONTRAST TECHNIQUE: Multidetector CT imaging of the chest, abdomen and pelvis was performed following the standard protocol during bolus administration of intravenous contrast. CONTRAST:  100 mL Omnipaque 300 IV COMPARISON:  CT abdomen 08/17/2020. FINDINGS: CT CHEST FINDINGS Cardiovascular: Cardiomegaly. Prior CABG. Diffuse aortic atherosclerosis. No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 Shanika I. Hardin Negus, [place], [place] Office number [phone] Pager [place] 12/28/2020, 2:21 PM              CT HEAD CODE STROKE WO CONTRAST  Result Date: 01/06/2021 CLINICAL DATA:  Code stroke. Initial evaluation for acute left-sided weakness EXAM: CT HEAD WITHOUT CONTRAST TECHNIQUE: Contiguous axial images were obtained from the base of the skull through the vertex without intravenous contrast. COMPARISON:  Prior CT and MRI from 12/20/2020. FINDINGS: Brain: Cerebral volume within normal limits for age. No acute intracranial hemorrhage. There is a new 1 cm hypodensity at the right lentiform nucleus/putamen, consistent with a small ischemic infarct, acute to subacute in appearance. This is new as compared to previous MRI and CT from 12/20/2020. No other evidence for acute large vessel territory infarct. No mass lesion, midline shift or mass effect. No hydrocephalus or extra-axial fluid collection. 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 Vascular: No visible hyperdense vessel. Skull: Scalp soft tissues and calvarium within normal limits. Sinuses/Orbits: Globes and orbital soft tissues  demonstrate no acute finding. Paranasal sinuses are largely clear. No mastoid effusion. Other: None. ASPECTS ([place] Stroke Program Early CT Score) - Ganglionic level infarction (caudate, lentiform nuclei, internal capsule, insula, M1-M3 cortex): 6 - Supraganglionic infarction (M4-M6 cortex): 3 Total score (0-10 with 10 being normal): 9 IMPRESSION: 1. 1 cm hypodensity at the right putamen, somewhat age indeterminate, but could reflect an acute to subacute ischemic infarct. This is new as compared to recent MRI and CT from 12/20/2020. No intracranial hemorrhage. 2. ASPECTS is 9. Critical Value/emergent results were called by telephone at the  time of interpretation on 01/06/2021 at 3:24 am to provider [facility] , who verbally acknowledged these results. Electronically Signed   By: Jeannine Boga M.D.   On: 01/06/2021 03:29    Orson Eva, DO  [facility]  If 7PM-7AM, please contact night-coverage [email or web address] Password [email or web address] 01/23/2021, 5:37 PM   LOS: 2 days

## 2021-01-23 NOTE — Progress Notes (Signed)
  Speech Language Pathology Treatment: Dysphagia  Patient Details Name: George Bradley MRN: [id number] DOB: 1941-02-19 Today's Date: 01/23/2021 Time: 8329-1916 SLP Time Calculation (min) (ACUTE ONLY): 17 min  Assessment / Plan / Recommendation Clinical Impression  Pt seen for follow up from clinical swallow evaluation completed yesterday and advancement to regular textures and thin liquids. Pt had consumed about half of his breakfast (did not eat the bacon as this is something he typically wouldn't eat). He endorses taking small bites/sips, alternating solids and liquids, and using previous bolus to help swallow the previous bolus. He feels comfortable continuing a self regulated regular diet at home going forward. Pt/spouse have my contact information should they have any further questions or concerns. SLP will sign off at this time.    HPI HPI: George Bradley  is a 80 y.o. male, with history of with history of dementia, neuropathy, hypothyroidism, hyperlipidemia, oropharyngeal cancer, GERD, coronary artery disease, and more presents to the ED with a chief complaint of dyspnea.   History of several hospitalization discharge April 18 postacute CVA, left-sided weakness, April 6 aspiration pneumonia. Pt had MBSS during acute stay at [facility] on 12/28/20 with recommendation for D3/mechanical soft and thin liquids (does aspirate small amounts of thin). Chest xray during this admission shows mild patchy airspace opacities at the bases, atelectasis vs mild PNA. BSE requested to help provide education regarding previous diet recommendations. This SLP is familiar with Pt and his wife.      SLP Plan  Discharge SLP treatment due to (comment);All goals met       Recommendations  Diet recommendations: Regular;Thin liquid Liquids provided via: Cup Medication Administration: Whole meds with liquid Supervision: Patient able to self feed Compensations: Multiple dry swallows after each bite/sip;Follow solids with  liquid Postural Changes and/or Swallow Maneuvers: Seated upright 90 degrees;Upright 30-60 min after meal;Out of bed for meals                Oral Care Recommendations: Oral care BID;Patient independent with oral care Follow up Recommendations: None SLP Visit Diagnosis: Dysphagia, pharyngeal phase (R13.13) Plan: Discharge SLP treatment due to (comment);All goals met       Thank you,  Genene Churn, [place]                 [place] 01/23/2021, 12:11 PM

## 2021-01-24 DIAGNOSIS — A419 Sepsis, unspecified organism: Secondary | ICD-10-CM

## 2021-01-24 LAB — CULTURE, RESPIRATORY W GRAM STAIN: Culture: NORMAL

## 2021-01-24 LAB — CBC
HCT: 37.6 % — ABNORMAL LOW (ref 39.0–52.0)
Hemoglobin: 12.2 g/dL — ABNORMAL LOW (ref 13.0–17.0)
MCH: 32.7 pg (ref 26.0–34.0)
MCHC: 32.4 g/dL (ref 30.0–36.0)
MCV: 100.8 fL — ABNORMAL HIGH (ref 80.0–100.0)
Platelets: 155 10*3/uL (ref 150–400)
RBC: 3.73 MIL/uL — ABNORMAL LOW (ref 4.22–5.81)
RDW: 14.2 % (ref 11.5–15.5)
WBC: 7.8 10*3/uL (ref 4.0–10.5)
nRBC: 0 % (ref 0.0–0.2)

## 2021-01-24 MED ORDER — AMOXICILLIN-POT CLAVULANATE 875-125 MG PO TABS: 1.0000 | ORAL_TABLET | Freq: Two times a day (BID) | ORAL | 0 refills | Status: DC

## 2021-01-24 MED ORDER — AMOXICILLIN-POT CLAVULANATE 875-125 MG PO TABS: 1.0000 | ORAL_TABLET | Freq: Two times a day (BID) | ORAL | Status: DC

## 2021-01-24 MED ORDER — QUETIAPINE FUMARATE 50 MG PO TABS: 50.0000 mg | ORAL_TABLET | Freq: Every day | ORAL | 0 refills | Status: DC

## 2021-01-24 NOTE — Plan of Care (Signed)

## 2021-01-24 NOTE — TOC Transition Note (Signed)
Transition of Care [facility]) - CM/SW Discharge Note   Patient Details  Name: George Bradley MRN: [id number] Date of Birth: September 20, 1941  [facility] CM/SW Contact:  Boneta Lucks, RN Phone Number: 01/24/2021, 11:48 AM   Clinical Narrative:   MD ordered hospital bed. Patient has aspiration pneumonitis, needing bed elevated above 30 degrees. Orders faxed to [facility]. Updated [name], she states they will stop by the store after discharge to look at bed options.     Barriers to Discharge: Barriers Resolved   Patient Goals and CMS Choice Patient states their goals for this hospitalization and ongoing recovery are:: to go home. CMS Medicare.gov Compare Post Acute Care list provided to:: Patient Represenative (must comment) Choice offered to / list presented to : Spouse  Discharge Placement              Patient to be transferred to facility by: wife Name of family member notified: Caludia Patient and family notified of of transfer: 01/24/21  Discharge Plan and Services               DME Arranged: Hospital bed DME Agency: [facility] Date DME Agency Contacted: 01/24/21 Time DME Agency Contacted: 0712 Representative spoke with at DME Agency: Faxed order

## 2021-01-24 NOTE — Progress Notes (Signed)
Nsg Discharge Note  Admit Date:  01/21/2021 Discharge date: 01/24/2021   Allegra Lai to be D/C'd Home per MD order.  AVS completed.  Copy for chart, and copy for patient signed, and dated. Patient/caregiver able to verbalize understanding. Removed IV-CDI, Reviewed d/c paperwork with patient and wife. Answered all questions. Wheeled stable patient and belongings to main entrance where he was picked up by his wife to d/c to home. Discharge Medication: Allergies as of 01/24/2021      Reactions   Zetia [ezetimibe] Other (See Comments)   "made me feel bad"      Medication List    STOP taking these medications   amitriptyline 25 MG tablet Commonly known as: ELAVIL     TAKE these medications   amLODipine 5 MG tablet Commonly known as: NORVASC Take 0.5 tablets (2.5 mg total) by mouth daily as needed (blood pressure on the higher end, no parameter).   amoxicillin-clavulanate 875-125 MG tablet Commonly known as: AUGMENTIN Take 1 tablet by mouth every 12 (twelve) hours.   aspirin 81 MG EC tablet Take 1 tablet (81 mg total) by mouth daily. Swallow whole.   busPIRone 5 MG tablet Commonly known as: BUSPAR Take 5 mg by mouth 2 (two) times daily as needed (anxiety).   donepezil 5 MG tablet Commonly known as: ARICEPT Take 5 mg by mouth at bedtime.   fludrocortisone 0.1 MG tablet Commonly known as: FLORINEF Take 0.1 mg by mouth daily.   gabapentin 300 MG capsule Commonly known as: NEURONTIN TAKE 3 CAPSULES(900 MG) BY MOUTH THREE TIMES DAILY   levothyroxine 100 MCG tablet Commonly known as: SYNTHROID TAKE ONE TABLET BY MOUTH ONCE DAILY BEFORE  BREAKFAST What changed: See the new instructions.   LORazepam 1 MG tablet Commonly known as: ATIVAN TAKE 1 TABLET BY MOUTH THREE TIMES DAILY   pilocarpine 7.5 MG tablet Commonly known as: SALAGEN Take 7.5 mg by mouth 3 (three) times daily.   QUEtiapine 50 MG tablet Commonly known as: SEROQUEL Take 1 tablet (50 mg total) by mouth at  bedtime.   Repatha SureClick 950 MG/ML Soaj Generic drug: Evolocumab Inject 140 mg into the skin every 14 (fourteen) days.   rosuvastatin 20 MG tablet Commonly known as: CRESTOR Take 1 tablet (20 mg total) by mouth daily.   ticagrelor 90 MG Tabs tablet Commonly known as: BRILINTA Take 1 tablet (90 mg total) by mouth 2 (two) times daily.   traMADol 50 MG tablet Commonly known as: ULTRAM Take 50 mg by mouth 2 (two) times daily as needed for moderate pain.            Durable Medical Equipment  (From admission, onward)         Start     Ordered   01/24/21 1039  For home use only DME Hospital bed  Once       Question Answer Comment  Length of Need 12 Months   Patient has (list medical condition): recurrent aspiration pneumonia.   The above medical condition requires: Patient requires the ability to reposition frequently   Head must be elevated greater than: 30 degrees   Bed type Semi-electric      01/24/21 1038          Discharge Assessment: Vitals:   01/23/21 2034 01/24/21 0331  BP: (!) 166/90 126/79  Pulse: 84 77  Resp: 19 19  Temp: 98.4 F (36.9 C) 98.4 F (36.9 C)  SpO2: 97% 93%   Skin clean, dry and intact without  evidence of skin break down, no evidence of skin tears noted. IV catheter discontinued intact. Site without signs and symptoms of complications - no redness or edema noted at insertion site, patient denies c/o pain - only slight tenderness at site.  Dressing with slight pressure applied.  D/c Instructions-Education: Discharge instructions given to patient/family with verbalized understanding. D/c education completed with patient/family including follow up instructions, medication list, d/c activities limitations if indicated, with other d/c instructions as indicated by MD - patient able to verbalize understanding, all questions fully answered. Patient instructed to return to ED, call 911, or call MD for any changes in condition.  Patient escorted  via [place], and D/C home via private auto.  Santa Lighter, RN 01/24/2021 1:00 PM

## 2021-01-24 NOTE — Discharge Summary (Signed)
Physician Discharge Summary  George Bradley [id number] DOB: [birth year] DOA: 01/21/2021  PCP: [name], MD  Admit date: 01/21/2021 Discharge date: 01/24/2021  Admitted From: Home Disposition:  Home   Recommendations for Outpatient Follow-up:  1. Follow up with PCP in 1-2 weeks 2. Please obtain BMP/CBC in one week   Home Health: Refused Equipment/Devices:  Hospital Bed  Discharge Condition: Stable CODE STATUS: DNR Diet recommendation: Heart Healthy   Brief/Interim Summary: 80 y.o.male,with history ofwith history ofdementia, neuropathy, hypothyroidism, hyperlipidemia, oropharyngeal cancer, GERD, coronary artery disease,and more presents to the ED with a chief complaint of dyspnea.  History of several hospitalization discharge April 18 postacute CVA, left-sided weakness, April 6 aspiration pneumonia,.Marland KitchenMarland KitchenWas supposed to be on dysphagia 3 diet noncompliant. Wife provided most history due to his dementia. Also reported fluctuating blood pressure.   In the ED Temp 100.2, improved to 98.6, heart rate 61-103, respiratory rate 16-23, blood pressure 82/52 No leukocytosis with white blood cell count 10.2, hemoglobin 14.6 CMP within normal notes with exception of slight transaminitis with AST of 45 and ALT of 60. Negative respiratory panel Chest x-ray shows mild patchy opacities at bases, atelectasis versus mild pneumonia Lactic acid 1.7 Blood/urine culture obtained EKG shows a heart rate of 89, sinus rhythm, QTC 452 Patient was started on broad-spectrum antibiotics with vancomycin, cefepime, Flagyl initially.  This was de-escalated to cefepime and the patient continued to improve clinically.  Discharge Diagnoses:  1. Sepsis 2/2 pneumonia 1. Source of infection aspiration pneumonia 2. Vitals on admissionHR 103, BP 82/52, R23, and respiratory failure;  Present on admission 3. CXR shows Mild patchy opacities at bases - pneumonia 4. Blood cultures>>neg to date 5. Sputum  cultureif able to obtain>>unrevealing 6. Negative respiratory panel 7. Started on vanc/cefepime/flagyl>>>d/c vanc and metro 8. Hx of aspiration pneumonia - family Has questions about dysphagia 3 diet. 9. Re-consult SLP-->regular diet with thin liquids 10. D/c home with amox/clav x 4 more days  2. Acute respiratory failure with hypoxia 1. Improved this morning, weaned down to room air, satting 98% 2. On admission:documented O2 sats down to 84% 3. Supplemental oxygen as needed 4. Continue to monitor 3. HTN 1. Continue norvasc, 2. Hypertensive, increasing dose of Norvasc 4. HLD 1. Continue Repatha and statin 2. Stable  5. Insomnia 1. Failed ativan, trazodone, melatonin and ambien at home 2. Start seroquel which helped.  Discussed risks, benefits and alternatives including but not limited to its "black box warning".  Spouse acknowledges risk and wishes to continue 3. LFTs are slightly elevated at 45 and 60>>improved 4. Monitor LFTs closely--improved 6. Transaminitis 1. AST 45, ALT 60--due to sepsis 2. Likely secondary to dehydration 3. Continue fluids 4. improved 7. Hypothyroidism 1. Continue synthroid 8. History of CVA, with dysphagia 1.Reconsulting speech for evaluation supposed to be on dysphagia 3 diet 2.Consulting dietitian for dietary supplements 9. AKI 1.Monitoring BUN/creatinine, continue gentle IV fluid hydration 2.Continue ASA and Brillinta  10.Dementia withoutBehavior disturbances 1.Continue home medication of Aricept, Seroquel nightly 2.Thus far pleasant, cooperative   Discharge Instructions   Allergies as of 01/24/2021      Reactions   Zetia [ezetimibe] Other (See Comments)   "made me feel bad"      Medication List    STOP taking these medications   amitriptyline 25 MG tablet Commonly known as: ELAVIL     TAKE these medications   amLODipine 5 MG  tablet Commonly known as: NORVASC Take 0.5 tablets (2.5 mg total) by mouth daily as needed (blood pressure on  the higher end, no parameter).   amoxicillin-clavulanate 875-125 MG tablet Commonly known as: AUGMENTIN Take 1 tablet by mouth every 12 (twelve) hours.   aspirin 81 MG EC tablet Take 1 tablet (81 mg total) by mouth daily. Swallow whole.   busPIRone 5 MG tablet Commonly known as: BUSPAR Take 5 mg by mouth 2 (two) times daily as needed (anxiety).   donepezil 5 MG tablet Commonly known as: ARICEPT Take 5 mg by mouth at bedtime.   fludrocortisone 0.1 MG tablet Commonly known as: FLORINEF Take 0.1 mg by mouth daily.   gabapentin 300 MG capsule Commonly known as: NEURONTIN TAKE 3 CAPSULES(900 MG) BY MOUTH THREE TIMES DAILY   levothyroxine 100 MCG tablet Commonly known as: SYNTHROID TAKE ONE TABLET BY MOUTH ONCE DAILY BEFORE  BREAKFAST What changed: See the new instructions.   LORazepam 1 MG tablet Commonly known as: ATIVAN TAKE 1 TABLET BY MOUTH THREE TIMES DAILY   pilocarpine 7.5 MG tablet Commonly known as: SALAGEN Take 7.5 mg by mouth 3 (three) times daily.   QUEtiapine 50 MG tablet Commonly known as: SEROQUEL Take 1 tablet (50 mg total) by mouth at bedtime.   Repatha SureClick [id number] MG/ML Soaj Generic drug: Evolocumab Inject 140 mg into the skin every 14 (fourteen) days.   rosuvastatin 20 MG tablet Commonly known as: CRESTOR Take 1 tablet (20 mg total) by mouth daily.   ticagrelor 90 MG Tabs tablet Commonly known as: BRILINTA Take 1 tablet (90 mg total) by mouth 2 (two) times daily.   traMADol 50 MG tablet Commonly known as: ULTRAM Take 50 mg by mouth 2 (two) times daily as needed for moderate pain.            Durable Medical Equipment  (From admission, onward)         Start     Ordered   01/24/21 1039  For home use only DME Hospital bed  Once       Question Answer Comment  Length of Need 12 Months   Patient has (list medical condition):  recurrent aspiration pneumonia.   The above medical condition requires: Patient requires the ability to reposition frequently   Head must be elevated greater than: 30 degrees   Bed type Semi-electric      01/24/21 1038          Allergies  Allergen Reactions  . Zetia [Ezetimibe] Other (See Comments)    "made me feel bad"    Consultations:  none   Procedures/Studies: CT Angio Head W or Wo Contrast  Result Date: 01/06/2021 CLINICAL DATA:  Initial evaluation for acute stroke, left-sided weakness, now resolved. EXAM: CT ANGIOGRAPHY HEAD AND NECK CT PERFUSION BRAIN TECHNIQUE: Multidetector CT imaging of the head and neck was performed using the standard protocol during bolus administration of intravenous contrast. Multiplanar CT image reconstructions and MIPs were obtained to evaluate the vascular anatomy. Carotid stenosis measurements (when applicable) are obtained utilizing NASCET criteria, using the distal internal carotid diameter as the denominator. Multiphase CT imaging of the brain was performed following IV bolus contrast injection. Subsequent parametric perfusion maps were calculated using RAPID software. CONTRAST:  150mL OMNIPAQUE IOHEXOL 350 MG/ML SOLN COMPARISON:  Prior CT from earlier the same day as well as previous studies from 12/20/2020 and 12/19/2020. FINDINGS: CTA NECK FINDINGS Aortic arch: Visualized aortic arch normal in caliber with normal 3 vessel morphology. Moderate atheromatous change about the arch and origin of the great vessels without high-grade stenosis. Right carotid system: Right CCA is  now widely patent from its origin to the bifurcation. Intravascular stent traverses the right bifurcation extending into the proximal right ICA. Moderate stenosis of the stent at the level of the native bifurcation of up to approximately 50%. Stent is otherwise widely patent without intraluminal thrombus or other complication. Right ICA patent distally to the skull base without  stenosis, dissection or occlusion. Left carotid system: Left CCA tortuous proximally but is widely patent to the bifurcation without stenosis. Mild calcified plaque about the left bifurcation/proximal left ICA without significant stenosis. Left ICA patent distally to the skull base without stenosis, dissection or occlusion. Vertebral arteries: Both vertebral arteries arise from the subclavian arteries. Calcified plaque at the origin of the right subclavian artery with associated mild stenosis again noted. Atheromatous plaque at the origins of both vertebral arteries M cells with associated mild stenosis noted as well, also stable. Left vertebral artery slightly dominant. Vertebral arteries otherwise remain widely patent within the neck without stenosis, dissection or occlusion. Skeleton: No acute osseous finding. No discrete or worrisome osseous lesions. Mild multilevel degenerative spondylosis and facet arthrosis without high-grade stenosis. Other neck: No other acute soft tissue abnormality within the neck. No mass or adenopathy. Post radiation changes again noted. Upper chest: Post radiation scarring noted at the upper lungs bilaterally. Visualized upper chest demonstrates no other acute finding. Review of the MIP images confirms the above findings CTA HEAD FINDINGS Anterior circulation: Petrous segments are now both widely patent. Atheromatous plaque throughout the carotid siphons with no more than mild stenosis at the para clinoid segments. A1 segments widely patent. Normal anterior communicating artery complex. Anterior cerebral arteries remain widely patent within the neck. No M1 stenosis or occlusion. Right M1 bifurcates early. No proximal MCA branch occlusion. Distal MCA branches well perfused and symmetric. Posterior circulation: Both V4 segments remain patent to the vertebrobasilar junction. Both PICA origins are patent and normal. Basilar widely patent to its distal aspect. Superior cerebellar arteries  patent bilaterally. Both PCAs are primarily supplied via the basilar. Mild atheromatous irregularity within the PCAs bilaterally without significant stenosis. Venous sinuses: Grossly patent allowing for timing the contrast bolus. Anatomic variants: None significant.  No aneurysm. Review of the MIP images confirms the above findings CT Brain Perfusion Findings: ASPECTS: 9. IMPRESSION: CTA HEAD AND NECK IMPRESSION: 1. Negative CTA for emergent large vessel occlusion. 2. Interval revascularization of the right common and internal carotid arteries since previous exam, with vascular stent in place across the right carotid bifurcation. Focal narrowing of the stent by approximately 50% at the level of the native bifurcation. Otherwise, widely patent flow seen through the stent. 3. 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 4. Mild bilateral vertebral artery origins stenoses, also stable. 5.  Aortic Atherosclerosis (ICD10-I70.0). CT PERFUSION IMPRESSION: The CT perfusion portion of this exam unfortunately failed, with no perfusion maps generated. Results were called by telephone at the time of interpretation on 01/06/2021 at 4:00 am to provider [facility] , who verbally acknowledged these results. Electronically Signed   By: Jeannine Boga M.D.   On: 01/06/2021 04:45   CT Angio Neck W and/or Wo Contrast  Result Date: 01/06/2021 CLINICAL DATA:  Initial evaluation for acute stroke, left-sided weakness, now resolved. EXAM: CT ANGIOGRAPHY HEAD AND NECK CT PERFUSION BRAIN TECHNIQUE: Multidetector CT imaging of the head and neck was performed using the standard protocol during bolus administration of intravenous contrast. Multiplanar CT image reconstructions and MIPs were obtained to evaluate the vascular anatomy. Carotid stenosis measurements (when applicable) are  obtained utilizing NASCET criteria, using the distal internal carotid diameter as the denominator. Multiphase CT imaging of  the brain was performed following IV bolus contrast injection. Subsequent parametric perfusion maps were calculated using RAPID software. CONTRAST:  145mL OMNIPAQUE IOHEXOL 350 MG/ML SOLN COMPARISON:  Prior CT from earlier the same day as well as previous studies from 12/20/2020 and 12/19/2020. FINDINGS: CTA NECK FINDINGS Aortic arch: Visualized aortic arch normal in caliber with normal 3 vessel morphology. Moderate atheromatous change about the arch and origin of the great vessels without high-grade stenosis. Right carotid system: Right CCA is now widely patent from its origin to the bifurcation. Intravascular stent traverses the right bifurcation extending into the proximal right ICA. Moderate stenosis of the stent at the level of the native bifurcation of up to approximately 50%. Stent is otherwise widely patent without intraluminal thrombus or other complication. Right ICA patent distally to the skull base without stenosis, dissection or occlusion. Left carotid system: Left CCA tortuous proximally but is widely patent to the bifurcation without stenosis. Mild calcified plaque about the left bifurcation/proximal left ICA without significant stenosis. Left ICA patent distally to the skull base without stenosis, dissection or occlusion. Vertebral arteries: Both vertebral arteries arise from the subclavian arteries. Calcified plaque at the origin of the right subclavian artery with associated mild stenosis again noted. Atheromatous plaque at the origins of both vertebral arteries M cells with associated mild stenosis noted as well, also stable. Left vertebral artery slightly dominant. Vertebral arteries otherwise remain widely patent within the neck without stenosis, dissection or occlusion. Skeleton: No acute osseous finding. No discrete or worrisome osseous lesions. Mild multilevel degenerative spondylosis and facet arthrosis without high-grade stenosis. Other neck: No other acute soft tissue abnormality within  the neck. No mass or adenopathy. Post radiation changes again noted. Upper chest: Post radiation scarring noted at the upper lungs bilaterally. Visualized upper chest demonstrates no other acute finding. Review of the MIP images confirms the above findings CTA HEAD FINDINGS Anterior circulation: Petrous segments are now both widely patent. Atheromatous plaque throughout the carotid siphons with no more than mild stenosis at the para clinoid segments. A1 segments widely patent. Normal anterior communicating artery complex. Anterior cerebral arteries remain widely patent within the neck. No M1 stenosis or occlusion. Right M1 bifurcates early. No proximal MCA branch occlusion. Distal MCA branches well perfused and symmetric. Posterior circulation: Both V4 segments remain patent to the vertebrobasilar junction. Both PICA origins are patent and normal. Basilar widely patent to its distal aspect. Superior cerebellar arteries patent bilaterally. Both PCAs are primarily supplied via the basilar. Mild atheromatous irregularity within the PCAs bilaterally without significant stenosis. Venous sinuses: Grossly patent allowing for timing the contrast bolus. Anatomic variants: None significant.  No aneurysm. Review of the MIP images confirms the above findings CT Brain Perfusion Findings: ASPECTS: 9. IMPRESSION: CTA HEAD AND NECK IMPRESSION: 1. Negative CTA for emergent large vessel occlusion. 2. Interval revascularization of the right common and internal carotid arteries since previous exam, with vascular stent in place across the right carotid bifurcation. Focal narrowing of the stent by approximately 50% at the level of the native bifurcation. Otherwise, widely patent flow seen through the stent. 3. 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 4. Mild bilateral vertebral artery origins stenoses, also stable. 5.  Aortic Atherosclerosis (ICD10-I70.0). CT PERFUSION IMPRESSION: The CT perfusion portion  of this exam unfortunately failed, with no perfusion maps generated. Results were called by telephone at the time of interpretation on 01/06/2021 at  4:00 am to provider Ezequiel Essex , who verbally acknowledged these results. Electronically Signed   By: Jeannine Boga M.D.   On: 01/06/2021 04:45   MR BRAIN WO CONTRAST  Result Date: 01/06/2021 CLINICAL DATA:  Neuro deficit, acute, stroke suspected. EXAM: MRI HEAD WITHOUT CONTRAST TECHNIQUE: Multiplanar, multiecho pulse sequences of the brain and surrounding structures were obtained without intravenous contrast. COMPARISON:  Noncontrast head CT and CT angiogram head/neck as well as CT perfusion performed 01/06/2021. brain MRI 02/19/2021. FINDINGS: Brain: Mild intermittent motion degradation. Mild cerebral and cerebellar atrophy. There is a 14 mm focus of restricted diffusion within the right internal capsule/lentiform nucleus compatible with acute infarction. Redemonstrated small chronic cortical infarct within the left occipital lobe (PCA vascular territory). Background mild multifocal T2/FLAIR hyperintensity within the cerebral white matter is nonspecific, but compatible with chronic small vessel ischemic disease. Redemonstrated small chronic infarct within the left cerebellar hemisphere. No evidence of intracranial mass. No chronic intracranial blood products. No extra-axial fluid collection. No midline shift. Vascular: Expected proximal arterial flow voids. Skull and upper cervical spine: No focal marrow lesion. Sinuses/Orbits: Visualized orbits show no acute finding. Trace bilateral ethmoid sinus mucosal thickening. Other: Right mastoid effusion. IMPRESSION: 14 mm acute infarct within the right internal capsule/lentiform nucleus. Redemonstrated small chronic cortical infarct within the left occipital lobe. Stable background mild generalized parenchymal atrophy and mild cerebral white matter chronic small vessel ischemic disease. Redemonstrated small  chronic infarct within the left cerebellar hemisphere. Mild bilateral ethmoid sinus mucosal thickening. Right mastoid effusion. Electronically Signed   By: Kellie Simmering DO   On: 01/06/2021 14:16   CT CHEST ABDOMEN PELVIS W CONTRAST  Result Date: 12/26/2020 CLINICAL DATA:  Abdominal pain, fever EXAM: CT CHEST, ABDOMEN, AND PELVIS WITH CONTRAST TECHNIQUE: Multidetector CT imaging of the chest, abdomen and pelvis was performed following the standard protocol during bolus administration of intravenous contrast. CONTRAST:  100 mL Omnipaque 300 IV COMPARISON:  CT abdomen 08/17/2020. FINDINGS: CT CHEST FINDINGS Cardiovascular: Cardiomegaly. Prior CABG. Diffuse aortic atherosclerosis. No aneurysm. Mediastinum/Nodes: No mediastinal, hilar, or axillary adenopathy. Trachea and esophagus are unremarkable. Thyroid unremarkable. Lungs/Pleura: Airspace disease noted throughout the right middle lobe and both lower lobes most compatible with pneumonia. No effusions. Musculoskeletal: Chest wall soft tissues are unremarkable. No acute bony abnormality. CT ABDOMEN PELVIS FINDINGS Hepatobiliary: No focal hepatic abnormality. Gallbladder unremarkable. Pancreas: No focal abnormality or ductal dilatation. Spleen: No focal abnormality.  Normal size. Adrenals/Urinary Tract: No hydronephrosis or ureteral stones. Small cyst in the midpole of the right kidney. Adrenal glands and urinary bladder unremarkable. Stomach/Bowel: Large stool burden throughout the colon. No evidence of bowel obstruction. Appendix is normal. Vascular/Lymphatic: Aortoiliac atherosclerosis. No evidence of aneurysm or adenopathy. Reproductive: No visible focal abnormality. Other: No free fluid or free air. Musculoskeletal: No acute bony abnormality. Degenerative changes in the lumbar spine. IMPRESSION: Bilateral airspace disease throughout the right middle lobe and both lower lobes compatible with pneumonia. Large stool burden in the colon. Prior CABG.  Diffuse aortic  atherosclerosis. Electronically Signed   By: Rolm Baptise M.D.   On: 12/26/2020 22:56   CT CEREBRAL PERFUSION W CONTRAST  Result Date: 01/06/2021 CLINICAL DATA:  Initial evaluation for acute stroke, left-sided weakness, now resolved. EXAM: CT ANGIOGRAPHY HEAD AND NECK CT PERFUSION BRAIN TECHNIQUE: Multidetector CT imaging of the head and neck was performed using the standard protocol during bolus administration of intravenous contrast. Multiplanar CT image reconstructions and MIPs were obtained to evaluate the vascular anatomy. Carotid stenosis measurements (when applicable)  are obtained utilizing NASCET criteria, using the distal internal carotid diameter as the denominator. Multiphase CT imaging of the brain was performed following IV bolus contrast injection. Subsequent parametric perfusion maps were calculated using RAPID software. CONTRAST:  165mL OMNIPAQUE IOHEXOL 350 MG/ML SOLN COMPARISON:  Prior CT from earlier the same day as well as previous studies from 12/20/2020 and 12/19/2020. FINDINGS: CTA NECK FINDINGS Aortic arch: Visualized aortic arch normal in caliber with normal 3 vessel morphology. Moderate atheromatous change about the arch and origin of the great vessels without high-grade stenosis. Right carotid system: Right CCA is now widely patent from its origin to the bifurcation. Intravascular stent traverses the right bifurcation extending into the proximal right ICA. Moderate stenosis of the stent at the level of the native bifurcation of up to approximately 50%. Stent is otherwise widely patent without intraluminal thrombus or other complication. Right ICA patent distally to the skull base without stenosis, dissection or occlusion. Left carotid system: Left CCA tortuous proximally but is widely patent to the bifurcation without stenosis. Mild calcified plaque about the left bifurcation/proximal left ICA without significant stenosis. Left ICA patent distally to the skull base without stenosis,  dissection or occlusion. Vertebral arteries: Both vertebral arteries arise from the subclavian arteries. Calcified plaque at the origin of the right subclavian artery with associated mild stenosis again noted. Atheromatous plaque at the origins of both vertebral arteries M cells with associated mild stenosis noted as well, also stable. Left vertebral artery slightly dominant. Vertebral arteries otherwise remain widely patent within the neck without stenosis, dissection or occlusion. Skeleton: No acute osseous finding. No discrete or worrisome osseous lesions. Mild multilevel degenerative spondylosis and facet arthrosis without high-grade stenosis. Other neck: No other acute soft tissue abnormality within the neck. No mass or adenopathy. Post radiation changes again noted. Upper chest: Post radiation scarring noted at the upper lungs bilaterally. Visualized upper chest demonstrates no other acute finding. Review of the MIP images confirms the above findings CTA HEAD FINDINGS Anterior circulation: Petrous segments are now both widely patent. Atheromatous plaque throughout the carotid siphons with no more than mild stenosis at the para clinoid segments. A1 segments widely patent. Normal anterior communicating artery complex. Anterior cerebral arteries remain widely patent within the neck. No M1 stenosis or occlusion. Right M1 bifurcates early. No proximal MCA branch occlusion. Distal MCA branches well perfused and symmetric. Posterior circulation: Both V4 segments remain patent to the vertebrobasilar junction. Both PICA origins are patent and normal. Basilar widely patent to its distal aspect. Superior cerebellar arteries patent bilaterally. Both PCAs are primarily supplied via the basilar. Mild atheromatous irregularity within the PCAs bilaterally without significant stenosis. Venous sinuses: Grossly patent allowing for timing the contrast bolus. Anatomic variants: None significant.  No aneurysm. Review of the MIP  images confirms the above findings CT Brain Perfusion Findings: ASPECTS: 9. IMPRESSION: CTA HEAD AND NECK IMPRESSION: 1. Negative CTA for emergent large vessel occlusion. 2. Interval revascularization of the right common and internal carotid arteries since previous exam, with vascular stent in place across the right carotid bifurcation. Focal narrowing of the stent by approximately 50% at the level of the native bifurcation. Otherwise, widely patent flow seen through the stent. 3. 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 4. Mild bilateral vertebral artery origins stenoses, also stable. 5.  Aortic Atherosclerosis (ICD10-I70.0). CT PERFUSION IMPRESSION: The CT perfusion portion of this exam unfortunately failed, with no perfusion maps generated. Results were called by telephone at the time of interpretation on 01/06/2021 at  4:00 am to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 [place];  Service: Radiology;  Laterality: N/A; . SAVORY DILATION N/A 04/14/2013  Procedure: SAVORY DILATION;  Surgeon: Rogene Houston, MD;  [facility];  Service: Endoscopy;  Laterality: N/A; . SAVORY DILATION N/A 01/23/2014  Procedure: SAVORY DILATION;  Surgeon: Rogene Houston, MD;  [facility];  Service: Endoscopy;  Laterality: N/A; . TRANSTHORACIC ECHOCARDIOGRAM  02-09-2009   dr Vidal Schwalbe  [facility], ef 55-60%/  mild AV stenosis (valve area 1.7cm^2)/  mild MV stenosis (valve area 1.79cm^2)/ mild TR and MR . TRANSURETHRAL INCISION OF PROSTATE N/A 12/30/2016  Procedure: TRANSURETHRAL INCISION OF THE PROSTATE (TUIP);  Surgeon: Irine Seal, MD;  Location: [facility];  Service: Urology;  Laterality: N/A; HPI: Pt is an 80 y.o. 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 Regular textured diet and thin liquids recommended at that time with f/u from OP SLP services. He subsequently recieved services on 07/26/19 where ST trained in swallowing exercises and educated regarding swallow strategies and precautions. CT Chest  (12/26/2020) revealed bilateral airspace disease throughout the right middle lobe and both lower lobes compatible with pneumonia.  No data recorded Assessment / Plan / Recommendation CHL IP CLINICAL IMPRESSIONS 12/28/2020 Clinical Impression Pt presents with pharyngeal dysphagia characterized by reduced lingual retraction, a pharyngeal delay, reduced anterior laryngeal movement, and reduced cricopharyngeal relaxation. He demonstrated vallecular residue, pyriform sinus residue, incomplete epiglottic, and reduced laryngeal vestibule closure. The swallow was often triggered with the head of the bolus at the level of the valleculae or pyriform sinuses with solids and liquids. Penetration (PAS 3, 5) was noted with thin liquids during the swallow and silent aspiration (PAS 8) was noted thereafter. Prompted coughing was ineffective in mobilizing or expelling the aspirate due to its weakness. Throat clearing and coughing did mobilize penetrated material, but did not effectively expel it from the larynx. Frequency of penetration (PAS 3) and subsequent silent aspiration (PAS 8) was reduced with nectar thick liquids, but laryngeal invasion was not eliminated. No functional benefit was noted with postural modifications or with use of an effortful swallow. Additionally, these strategies were intermittently  noted to facilitate increased aspiration. Bolus formation was mildly impacted by xerostomia, but it  was [facility] during the study. Pharyngeal residue was reduced with use of a liquid wash and with reduced bolus sizes. Reduced transit of the barium tablet (given with liquids) was noted at the level of the level of UES and through the upper thoracic esophagus, but movement was facilitated with useof boluses of puree. 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 Considering the fact that laryngeal invasion was not eliminated with use of thickened liquids, and that evidence shows that his risk of aspiration-related complications would be higher with aspiration of thickened liquids, it is recommended that his current diet of dysphagia 3 solids and nectar thick liquids be continued at this time with observance of swallowing precautions. SLP will follow for dysphagia treatment. SLP Visit Diagnosis Dysphagia, pharyngeal phase (R13.13) Attention and concentration deficit following -- Frontal lobe and executive function deficit following -- Impact on safety and function Moderate aspiration risk   CHL IP TREATMENT RECOMMENDATION 12/28/2020 Treatment Recommendations Therapy as outlined in treatment plan below   Prognosis 12/28/2020 Prognosis for Safe Diet Advancement Fair Barriers to Reach Goals Time post onset;Severity of deficits Barriers/Prognosis Comment -- CHL IP DIET RECOMMENDATION 12/28/2020 SLP Diet Recommendations Dysphagia 3 (Mech soft) solids;Thin liquid Liquid Administration via Cup;No straw Medication Administration Whole meds with puree Compensations Slow rate;Small sips/bites;Follow solids with liquid Postural Changes Seated upright at 90 degrees   CHL IP OTHER RECOMMENDATIONS 12/28/2020 Recommended Consults -- Oral Care Recommendations Patient independent with oral care;Oral care QID Other Recommendations  --   CHL IP FOLLOW UP RECOMMENDATIONS 12/28/2020 Follow up Recommendations Outpatient SLP   CHL IP FREQUENCY AND DURATION 12/28/2020 Speech Therapy Frequency (ACUTE ONLY) min 2x/week Treatment Duration 2 weeks      CHL IP ORAL PHASE 12/28/2020 Oral Phase WFL Oral - Pudding Teaspoon -- Oral - Pudding Cup -- Oral - Honey Teaspoon -- Oral - Honey Cup -- Oral - Nectar Teaspoon -- Oral - Nectar Cup -- Oral - Nectar Straw -- Oral - Thin Teaspoon -- Oral - Thin Cup -- Oral - Thin Straw -- Oral - Puree -- Oral - Mech Soft -- Oral - Regular -- Oral - Multi-Consistency -- Oral - Pill -- Oral Phase - Comment --  CHL IP PHARYNGEAL PHASE 12/28/2020 Pharyngeal Phase Impaired Pharyngeal- Pudding Teaspoon -- Pharyngeal -- Pharyngeal- Pudding Cup -- Pharyngeal -- Pharyngeal- Honey Teaspoon -- Pharyngeal -- Pharyngeal- Honey Cup -- Pharyngeal -- Pharyngeal- Nectar Teaspoon -- Pharyngeal -- Pharyngeal- Nectar Cup Penetration/Apiration after swallow;Penetration/Aspiration during swallow;Trace aspiration;Reduced epiglottic inversion;Reduced anterior laryngeal mobility;Reduced airway/laryngeal closure;Delayed swallow initiation-vallecula;Pharyngeal residue - valleculae;Pharyngeal residue - pyriform Pharyngeal Material enters airway, CONTACTS cords and not ejected out;Material enters airway, remains ABOVE vocal cords and not ejected out;Material enters airway, passes BELOW cords without attempt by patient to eject out (silent aspiration) Pharyngeal- Nectar Straw Penetration/Apiration after swallow;Penetration/Aspiration during swallow;Trace aspiration;Reduced epiglottic inversion;Reduced anterior laryngeal mobility;Reduced airway/laryngeal closure;Delayed swallow initiation-vallecula;Pharyngeal residue - valleculae;Pharyngeal residue - pyriform Pharyngeal Material enters airway, remains ABOVE vocal cords and not ejected out;Material enters airway, CONTACTS cords and not ejected out;Material enters airway, passes BELOW cords without attempt by  patient to eject out (silent aspiration) Pharyngeal- Thin Teaspoon -- Pharyngeal -- Pharyngeal- Thin Cup Penetration/Apiration after swallow;Penetration/Aspiration during swallow;Trace aspiration;Reduced epiglottic inversion;Reduced anterior  laryngeal mobility;Reduced airway/laryngeal closure;Delayed swallow initiation-vallecula;Pharyngeal residue - valleculae;Pharyngeal residue - pyriform Pharyngeal Material enters airway, remains ABOVE vocal cords and not ejected out;Material enters airway, CONTACTS cords and not ejected out;Material enters airway, passes BELOW cords without attempt by patient to eject out (silent aspiration) Pharyngeal- Thin Straw -- Pharyngeal -- Pharyngeal- Puree -- Pharyngeal -- Pharyngeal- Mechanical Soft -- Pharyngeal -- Pharyngeal- Regular Reduced epiglottic inversion;Reduced anterior laryngeal mobility;Reduced airway/laryngeal closure;Delayed swallow initiation-vallecula;Pharyngeal residue - valleculae;Pharyngeal residue - pyriform Pharyngeal -- Pharyngeal- Multi-consistency -- Pharyngeal -- Pharyngeal- Pill Reduced epiglottic inversion;Reduced anterior laryngeal mobility;Reduced airway/laryngeal closure;Delayed swallow initiation-vallecula;Pharyngeal residue - valleculae;Pharyngeal residue - pyriform;Pharyngeal residue - cp segment Pharyngeal -- Pharyngeal Comment --  CHL IP CERVICAL ESOPHAGEAL PHASE 12/28/2020 Cervical Esophageal Phase Impaired Pudding Teaspoon -- Pudding Cup -- Honey Teaspoon -- Honey Cup -- Nectar Teaspoon -- Nectar Cup -- Nectar Straw -- Thin Teaspoon -- Thin Cup -- Thin Straw -- Puree -- Mechanical Soft -- Regular -- Multi-consistency -- Pill Reduced cricopharyngeal relaxation Cervical Esophageal Comment -- Shanika I. Hardin Negus, [place], [place] Office number [phone] Pager [place] 12/28/2020, 2:21 PM              CT HEAD CODE STROKE WO CONTRAST  Result Date: 01/06/2021 CLINICAL DATA:  Code stroke. Initial  evaluation for acute left-sided weakness EXAM: CT HEAD WITHOUT CONTRAST TECHNIQUE: Contiguous axial images were obtained from the base of the skull through the vertex without intravenous contrast. COMPARISON:  Prior CT and MRI from 12/20/2020. FINDINGS: Brain: Cerebral volume within normal limits for age. No acute intracranial hemorrhage. There is a new 1 cm hypodensity at the right lentiform nucleus/putamen, consistent with a small ischemic infarct, acute to subacute in appearance. This is new as compared to previous MRI and CT from 12/20/2020. No other evidence for acute large vessel territory infarct. No mass lesion, midline shift or mass effect. No hydrocephalus or extra-axial fluid collection. 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 Vascular: No visible hyperdense vessel. Skull: Scalp soft tissues and calvarium within normal limits. Sinuses/Orbits: Globes and orbital soft tissues demonstrate no acute finding. Paranasal sinuses are largely clear. No mastoid effusion. Other: None. ASPECTS ([place] Stroke Program Early CT Score) - Ganglionic level infarction (caudate, lentiform nuclei, internal capsule, insula, M1-M3 cortex): 6 - Supraganglionic infarction (M4-M6 cortex): 3 Total score (0-10 with 10 being normal): 9 IMPRESSION: 1. 1 cm hypodensity at the right putamen, somewhat age indeterminate, but could reflect an acute to subacute ischemic infarct. This is new as compared to recent MRI and CT from 12/20/2020. No intracranial hemorrhage. 2. ASPECTS is 9. Critical Value/emergent results were called by telephone at the time of interpretation on 01/06/2021 at 3:24 am to provider [facility] , who verbally acknowledged these results. Electronically Signed   By: Jeannine Boga M.D.   On: 01/06/2021 03:29     Microbiology: Recent Results (from the past 240 hour(s))  Resp Panel by RT-PCR (Flu A&B, Covid) Nasopharyngeal Swab     Status: None   Collection Time: 01/21/21  5:03 PM    Specimen: Nasopharyngeal Swab; Nasopharyngeal(NP) swabs in vial transport medium  Result Value Ref Range Status   SARS Coronavirus 2 by RT PCR NEGATIVE NEGATIVE Final    Comment: (NOTE) SARS-CoV-2 target nucleic acids are NOT DETECTED.  The SARS-CoV-2 RNA is generally detectable in upper respiratory specimens during the acute phase of infection. The lowest concentration of SARS-CoV-2 viral copies this assay can detect is 138 copies/mL. A negative result does not preclude  SARS-Cov-2 infection and should not be used as the sole basis for treatment or other patient management decisions. A negative result may occur with  improper specimen collection/handling, submission of specimen other than nasopharyngeal swab, presence of viral mutation(s) within the areas targeted by this assay, and inadequate number of viral copies(<138 copies/mL). A negative result must be combined with clinical observations, patient history, and epidemiological information. The expected result is Negative.  Fact Sheet for Patients:  EntrepreneurPulse.com.au  Fact Sheet for Healthcare Providers:  IncredibleEmployment.be  This test is no t yet approved or cleared by the [place] FDA and  has been authorized for detection and/or diagnosis of SARS-CoV-2 by FDA under an Emergency Use Authorization (EUA). This EUA will remain  in effect (meaning this test can be used) for the duration of the COVID-19 declaration under Section 564(b)(1) of the Act, 21 U.S.C.section 360bbb-3(b)(1), unless the authorization is terminated  or revoked sooner.       Influenza A by PCR NEGATIVE NEGATIVE Final   Influenza B by PCR NEGATIVE NEGATIVE Final    Comment: (NOTE) The Xpert Xpress SARS-CoV-2/FLU/RSV plus assay is intended as an aid in the diagnosis of influenza from Nasopharyngeal swab specimens and should not be used as a sole basis for treatment. Nasal washings and aspirates are  unacceptable for Xpert Xpress SARS-CoV-2/FLU/RSV testing.  Fact Sheet for Patients: EntrepreneurPulse.com.au  Fact Sheet for Healthcare Providers: IncredibleEmployment.be  This test is not yet approved or cleared by the [place] FDA and has been authorized for detection and/or diagnosis of SARS-CoV-2 by FDA under an Emergency Use Authorization (EUA). This EUA will remain in effect (meaning this test can be used) for the duration of the COVID-19 declaration under Section 564(b)(1) of the Act, 21 U.S.C. section 360bbb-3(b)(1), unless the authorization is terminated or revoked.  Performed at [facility], [address], [place], [place] [address]   Blood Culture (routine x 2)     Status: None (Preliminary result)   Collection Time: 01/21/21  5:19 PM   Specimen: BLOOD  Result Value Ref Range Status   Specimen Description BLOOD RIGHT ARM  Final   Special Requests   Final    BOTTLES DRAWN AEROBIC AND ANAEROBIC Blood Culture adequate volume   Culture   Final    NO GROWTH 2 DAYS Performed at [facility], [address], [place], [place] [address]    Report Status PENDING  Incomplete  Blood Culture (routine x 2)     Status: None (Preliminary result)   Collection Time: 01/21/21  5:19 PM   Specimen: BLOOD  Result Value Ref Range Status   Specimen Description BLOOD LEFT ARM  Final   Special Requests   Final    BOTTLES DRAWN AEROBIC AND ANAEROBIC Blood Culture adequate volume   Culture   Final    NO GROWTH 2 DAYS Performed at [facility], [address] [place], [place] [address]    Report Status PENDING  Incomplete  Urine culture     Status: None   Collection Time: 01/21/21  7:33 PM   Specimen: In/Out Cath Urine  Result Value Ref Range Status   Specimen Description   Final    IN/OUT CATH URINE Performed at [facility], [address], [place], [place] [address]    Special Requests   Final    NONE Performed at [facility], [address] [place], [place] [address]    Culture   Final    NO GROWTH Performed at [facility], [place] [address], [place], [place] [address]    Report Status 01/23/2021 FINAL  Final  Culture, sputum-assessment     Status: None   Collection Time: 01/22/21  5:07 AM   Specimen: Expectorated Sputum  Result Value Ref Range Status   Specimen Description EXPECTORATED SPUTUM  Final   Special Requests NONE  Final   Sputum evaluation   Final    THIS SPECIMEN IS ACCEPTABLE FOR SPUTUM CULTURE Performed at [facility], [address], [place], [place] [address]    Report Status 01/22/2021 FINAL  Final  Culture, Respiratory w Gram Stain     Status: None   Collection Time: 01/22/21  5:07 AM  Result Value Ref Range Status   Specimen Description   Final    EXPECTORATED SPUTUM Performed at [facility], [address] [place], [place] [address]    Special Requests   Final    NONE Reflexed from [id number] Performed at [facility], [address], [place], [place] [address]    Gram Stain   Final    RARE SQUAMOUS EPITHELIAL CELLS PRESENT ABUNDANT WBC PRESENT, PREDOMINANTLY PMN ABUNDANT GRAM POSITIVE COCCI    Culture   Final    Normal respiratory flora-no Staph aureus or Pseudomonas seen Performed at [place] [address] [place], [place] [address]    Report Status 01/24/2021 FINAL  Final  MRSA PCR Screening     Status: None   Collection Time: 01/23/21  9:20 AM   Specimen: Nasopharyngeal  Result Value Ref Range Status   MRSA by PCR NEGATIVE NEGATIVE Final    Comment:        The GeneXpert MRSA Assay (FDA approved for NASAL specimens only), is one component of a comprehensive MRSA colonization surveillance program. It is not intended to diagnose MRSA infection nor to guide or monitor treatment for MRSA infections. Performed at [facility], [address] [place],  [address]      Labs: Basic Metabolic Panel: Recent Labs  Lab 01/21/21 1719 01/23/21 0923  NA 135  139  K 3.9 3.9  CL 97* 102  CO2 29 30  GLUCOSE 131* 109*  BUN 22 16  CREATININE 1.32* 1.00  CALCIUM 10.0 9.4   Liver Function Tests: Recent Labs  Lab 01/21/21 1719 01/23/21 0923  AST 45* 23  ALT 60* 33  ALKPHOS 41 31*  BILITOT 0.5 0.6  PROT 8.7* 7.2  ALBUMIN 4.3 3.5   No results for input(s): LIPASE, AMYLASE in the last 168 hours. No results for input(s): AMMONIA in the last 168 hours. CBC: Recent Labs  Lab 01/21/21 1719 01/23/21 0923 01/24/21 0538  WBC 10.2 6.9 7.8  NEUTROABS 8.5*  --   --   HGB 14.6 12.5* 12.2*  HCT 45.8 38.4* 37.6*  MCV 101.8* 101.6* 100.8*  PLT 176 151 155   Cardiac Enzymes: No results for input(s): CKTOTAL, CKMB, CKMBINDEX, TROPONINI in the last 168 hours. BNP: Invalid input(s): POCBNP CBG: No results for input(s): GLUCAP in the last 168 hours.  Time coordinating discharge:  36 minutes  Signed:  Orson Eva, DO [facility] Pager: [phone] 01/24/2021, 10:43 AM

## 2021-01-25 ENCOUNTER — Other Ambulatory Visit: Payer: Self-pay | Admitting: *Deleted

## 2021-01-25 NOTE — Patient Outreach (Signed)
[place] [facility] [facility] Care Management  01/25/2021  George Bradley 1940-11-08 [id number]   Notified that member was discharged from hospital yesterday.  Call placed to wife, state this is not a good time to talk and request call back in about an hour.     Update @ 1300:  Call placed back to member's wife, no answer, HIPAA compliant voice message left.  Will follow up within the next 3-4 business days.  Valente David, [place], MSN [place] [phone]

## 2021-01-26 LAB — CULTURE, BLOOD (ROUTINE X 2)
Culture: NO GROWTH
Culture: NO GROWTH
Special Requests: ADEQUATE
Special Requests: ADEQUATE

## 2021-01-28 DIAGNOSIS — Z125 Encounter for screening for malignant neoplasm of prostate: Secondary | ICD-10-CM | POA: Diagnosis not present

## 2021-01-28 DIAGNOSIS — E039 Hypothyroidism, unspecified: Secondary | ICD-10-CM | POA: Diagnosis not present

## 2021-01-28 DIAGNOSIS — E785 Hyperlipidemia, unspecified: Secondary | ICD-10-CM | POA: Diagnosis not present

## 2021-01-30 ENCOUNTER — Other Ambulatory Visit: Payer: Self-pay | Admitting: *Deleted

## 2021-01-30 NOTE — Patient Outreach (Signed)
[place] [facility]) Care Management  01/30/2021  George Bradley August 01, 1941 [id number]   Outreach attempt #2, successful to wife.  She report he is not doing well, continues to have labile blood pressure.  Ranges from 90s/50s to 180s/110s.  He has little to no energy when blood pressure is low, but is still independent.  He was to have PCP follow up yesterday but the office rescheduled for 5/24.  He has not started Seroquel due to question about insurance coverage, she will contact pharmacy to follow up.  He has finished his antibiotic course.  Continues to be adamant about driving, however she state he does not drive often. 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  Denies any urgent concerns, encouraged to contact this care manager with questions.  Agrees to follow up within the next week.  Goals Addressed            This Visit's Progress   . [facility] - Find Help in My Community   On track    Timeframe:  Short-Term Goal Priority:  High Start Date:           4/20                  Expected End Date:  5/20                     Follow Up Date 5/18  Barriers: Health Behaviors Other - Dementia    - follow-up on any referrals for help I am given    Why is this important?    Knowing how and where to find help for yourself or family in your neighborhood and community is an important skill.   You will want to take some steps to learn how.    Notes:   4/20 - Referral to [facility] for Palliative Care  5/11 - Unable to place referral to [facility].  Call placed to PCP office to request referral be placed to [place].  Call placed to Driver Rehab program for more information on participation    . [facility] - Keep or Improve My Strength-Stroke   On track    Timeframe:  Long-Range Goal Priority:  Medium Start Date:          4/20                   Expected End Date:   6/20                      Follow Up Date 5/18    - arrange in-home help services - increase  activity or exercise time a little every week - know who to call for help if I fall    Why is this important?    Before the stroke you probably did not think much about being safe when you are up and about.   Now, it may be harder for you to get around.   It may also be easier for you to trip or fall.   It is common to have muscle weakness after a stroke. You may also feel like you cannot control an arm or leg.   It will be helpful to work with a physical therapist to get your strength and muscle control back.   It is good to stay as active as you can. Walking and stretching help you stay strong and flexible.   The physical therapist will develop an exercise program just for  you.     Notes:   4/20 - Discussed the possibility of having home health PT for member, not ordered at this time but wife will consider  5/11 - PT was not ordered after recent discharge, wife feels his BP may be too unstable to participate.  Advised to notify this care manager if they would like to consider    . [facility] - Make and Keep All Appointments   On track    Timeframe:  Short-Term Goal Priority:  High Start Date:           4/20                  Expected End Date:   5/20                    Follow Up Date 5/18  Barriers: Other - Dementia    - call to cancel if needed - keep a calendar with appointment dates    Why is this important?    Part of staying healthy is seeing the doctor for follow-up care.   If you forget your appointments, there are some things you can do to stay on track.    Notes:   4/20 - Conference call placed to Neurology office in attempt to schedule visit, unsuccessful, wife will call back  5/11 - Cardiology 5/16, PCP - 5/24, and Neurology 7/11      Valente David, RN, MSN [place] Manager [phone]

## 2021-01-31 ENCOUNTER — Telehealth: Payer: Self-pay

## 2021-01-31 NOTE — Telephone Encounter (Signed)
Attempted to contact number in chart  to schedule a Palliative Care consult appointment. No answer left a message to return call.

## 2021-02-01 NOTE — Progress Notes (Signed)
Subjective:   George Bradley is seen for follow up CAD.   He had coronary artery bypass grafting in 2000. Cardiac catheterization was in 2007 and showed patent saphenous vein grafts x3 with a patent left internal mammary artery and well-preserved LV function. His EF is 55%. His last nuclear stress test in January 2018 showed a small fixed apical defect with normal ejection fraction. No ischemia. Echo in April showed normal EF and mild AS. Due to progressive symptoms cardiac cath was repeated in July 2018 and all grafts were still patent with no changed since 2007.   He also has a history of orthostatic hypotension and has persistent lightheadedness. He does note that increase salt intake helps. He has been on no antihypertensive therapy. He's had a history of what sounds like a Maze procedure when he lived in [place]. His other problems include hyperlipidemia, insomnia, oral cancer, and a history of depression. 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 He has had esophageal dilitation several times in the past. He is followed by oncology without recurrent tongue CA noted. 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   He was seen in October by George Fredrickson NP after experiencing a spell with possible fall. BP was labile. Echo showed mild to moderate AS and MR. EF was normal. Event monitor showed moderate PVCs with burden of 6%. Renal duplex showed mild right RAS. Incidental finding of severe SMA stenosis. He was seen in consultation with Dr Darrick Penna and CT angiogram of abdomen/pelvis was planned.   He presented to [facility] in March 2022 with concerns for generalized weakness: bilateral leg weakness, tremulous movements, and vision changes that occurred at home in "spells" since 3/29. MRI at [facility] revealed two small acute right MCA territory strokes. Neurology was consulted and requested CT angio head and neck with immediate transfer to [facility]. 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 On transfer from [facility] to [facility], EMS noted patient to have generalized weakness with initial assessment. During transfer, EMS noticed an acute neurologic change where the patient had sudden left arm weakness and left facial droop. He was brought straight to the ED and a Code Stroke was activated. Imaging was repeated revealing a new embolus at the the right MCA bifurcation and IR was activated for thrombectomy. Patient had successful mechanical thrombectomy of the right MCA as well as right carotid with rescue right carotid angioplasty and stenting and was admitted to the intensive care unit where blood pressure was tightly controlled.  He was extubated and did well.  He had no focal deficits on exam except diminished fine finger movements on the left.  NIH stroke scale was 0.  He was able to ambulate independently.  He was seen by physical occupational and speech therapy and recommended discharge home with outpatient therapies. He was advised to continue aspirin and Brilinta for a few months following his fresh carotid stent and maintain aggressive risk factor modification. He was DC on April 1.  He was readmitted 4/6-10 He developed shaking chills, increased confusion, and lethargy. He has chronic dysphagia and often coughs and chokes while eating or drinking. EMS was called and found the patient to have oxygen saturation of 77% and heart rate 140. He was treated with 300 cc of saline and started 4 L/min supplemental oxygen prior to arrival in the ED. Upon arrival to the ED, patient is found to be febrile  to 40.3C, saturating mid 90s on 4 L/min of supplemental oxygen, tachypneic, tachycardic, and with blood pressure 125/71.  Lactic acid was 2.0 and then 2.5. COVID-19 PCR is negative. CT chest, abdomen, and pelvis is concerning for multifocal pneumonia. Patient was admitted for severe sepsis and acute hypoxic  respiratory failure secondary to pneumonia. Patient was requiring 4 L of supplemental oxygen.  He was started on IV antibiotics (vancomycin and cefepime).  MRSA nares negative, vancomycin discontinued.  Patient was continued on cefepime and Flagyl for aspiration pneumonia.  Blood cultures no growth,  procalcitonin levels are normal.  Repeat chest x-ray shows improving pneumonia.  Patient weaned down on room air,  sats 93%.  Patient has ambulated in the hallway without need for oxygen.  His O2 saturation remained above 93%.  Patient feels better and want to be discharged.  Patient is being discharged home on Augmentin 875 twice a day for 7 days to complete total 10-day treatment.  Patient will follow up outpatient speech and swallow evaluation.  He was readmitted 4/17-18 with a new CVA. He reported new onset of left-sided weakness.On the day of admission he had persistent ambulatory dysfunction with left lower extremity weakness.  Progressive symptoms to the point where he was unable to move and became unresponsive.  When EMS arrived his oximetry was 79%, and his blood pressure systolic was 60. In the emergency department blood pressure 145/87, heart rate 83, temperature 98.4, respiratory rate 12, oxygenation 91%. Patient had decreased strength in the left lower extremity.Head CT with 1 cm hypodensity at the right putamen, age-indeterminate, could reflect acute to subacute ischemic infarct. Brain MRI with 14 mm acute infarct in the right internal capsule/lentiform nucleus.  Chronic cortical infarcts within the left occipital lobe.  Small chronic infarct within the left cerebellar hemisphere.  Patient's condition improved, he received intravenous fluids for his orthostatic hypotension. Neurology recommended continue with dual antiplatelet therapy with aspirin and ticagrelor.  He was readmitted 5/2-5/5 with aspiration PNA and sepsis. Treated with antibiotics.   On  follow up today he is doing OK. Reports his  energy is down. Breathing is not as good as it once was. No swallowing difficulty or cough. BP is still quite labile. Sometimes BP will go up to 035-009 systolic. He will take an amlodipine then. At other times he will get profound orthostatic hypotension with BP into the 60s. No palpitations or chest pain. Can't really tell any residual neurologic effects but memory is poor.   Current Outpatient Medications  Medication Sig Dispense Refill  . amitriptyline (ELAVIL) 25 MG tablet Take 25 mg by mouth at bedtime.    Marland Kitchen amLODipine (NORVASC) 5 MG tablet Take 0.5 tablets (2.5 mg total) by mouth daily as needed (blood pressure on the higher end, no parameter).    Marland Kitchen aspirin EC 81 MG EC tablet Take 1 tablet (81 mg total) by mouth daily. Swallow whole. 30 tablet 11  . busPIRone (BUSPAR) 5 MG tablet Take 5 mg by mouth 2 (two) times daily as needed (anxiety).    Marland Kitchen donepezil (ARICEPT) 5 MG tablet Take 5 mg by mouth at bedtime.    . Evolocumab (REPATHA SURECLICK) 381 MG/ML SOAJ Inject 140 mg into the skin every 14 (fourteen) days. 2 mL 11  . fludrocortisone (FLORINEF) 0.1 MG tablet Take 0.1 mg by mouth daily.    Marland Kitchen gabapentin (NEURONTIN) 300 MG capsule TAKE 3 CAPSULES(900 MG) BY MOUTH THREE TIMES DAILY 270 capsule 0  . levothyroxine (SYNTHROID, LEVOTHROID) 100 MCG tablet  TAKE ONE TABLET BY MOUTH ONCE DAILY BEFORE  BREAKFAST (Patient taking differently: Take 100 mcg by mouth daily before breakfast.) 90 tablet 0  . LORazepam (ATIVAN) 1 MG tablet TAKE 1 TABLET BY MOUTH THREE TIMES DAILY (Patient taking differently: Take 1 mg by mouth 3 (three) times daily.) 90 tablet 0  . pilocarpine (SALAGEN) 7.5 MG tablet Take 7.5 mg by mouth 3 (three) times daily.    . QUEtiapine (SEROQUEL) 50 MG tablet Take 1 tablet (50 mg total) by mouth at bedtime. 30 tablet 0  . rosuvastatin (CRESTOR) 20 MG tablet Take 1 tablet (20 mg total) by mouth daily. 90 tablet 3  . ticagrelor (BRILINTA) 90 MG TABS tablet Take 1 tablet (90 mg total) by  mouth 2 (two) times daily. 60 tablet 1  . traMADol (ULTRAM) 50 MG tablet Take 50 mg by mouth 2 (two) times daily as needed for moderate pain.      No current facility-administered medications for this visit.    Allergies  Allergen Reactions  . Zetia [Ezetimibe] Other (See Comments)    "made me feel bad"    Patient Active Problem List   Diagnosis Date Noted  . Sepsis due to undetermined organism ([place]) 01/24/2021  . Aspiration pneumonitis ([place]) 01/23/2021  . Pneumonia 01/21/2021  . Sepsis ([place]) 12/26/2020  . Acute respiratory failure with hypoxia ([place]) 12/26/2020  . Aspiration pneumonia ([place]) 12/26/2020  . Dementia ([place]) 12/26/2020  . Acute encephalopathy 12/26/2020  . CVA (cerebral vascular accident) ([place]) 12/19/2020  . Acute right MCA stroke ([place]) 12/19/2020  . 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  . Spinal stenosis of lumbosacral region 11/13/2020  . Numbness 11/13/2020  . Weakness of both lower extremities 11/13/2020  . 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 Dizziness 08/18/2017  . 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 Constipation 03/23/2014  . Dysphagia 03/23/2014  . 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 Xerostomia 06/10/2013  . Thrombocytopenia ([place]) 06/10/2013  . S/P radiation therapy   . Hypothyroidism 05/27/2012  . Orthostatic hypotension 05/11/2012  . Epigastric pain 05/11/2012  . History of tongue cancer 11/17/2011  . Depression 10/01/2011  . Renal artery stenosis, native, bilateral ([place]) 09/03/2011  . 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 Angina pectoris ([place]) 02/20/2011  . Hypercholesterolemia 02/20/2011  . Aortic valve stenosis, mild 02/20/2011    Social History   Tobacco Use  Smoking Status Former Smoker  . Years: 2.00  . Types: Cigars  . Quit date: 09/21/2009  . Years since quitting: 11.3  Smokeless Tobacco Former Systems developer   Tobacco Comment   2 cigars a week    Social History   Substance and Sexual Activity  Alcohol Use Yes  . Alcohol/week: 0.0 standard drinks   Comment: rare  wine    Family History  Problem Relation Age of Onset  . Heart disease Father   . Peptic Ulcer Disease Father   . Heart disease Brother   . Heart disease Sister   . Breast cancer Sister   . Dementia Mother   . Breast cancer Mother   . Hyperlipidemia Son     Review of Systems:   As noted in history of present illness.  All other systems were reviewed and are negative.   Physical Exam:    BP (!) 150/90   Pulse 86   Ht 6\' 1"  (1.854 m)   Wt 171 lb 9.6 oz (77.8 kg)   SpO2 94%  BMI 22.64 kg/m    GENERAL:  Well appearing thin WM in NAD HEENT:  PERRL, EOMI, sclera are clear. Oropharynx is clear. NECK:  No jugular venous distention, carotid upstroke brisk and symmetric, no bruits, no thyromegaly or adenopathy LUNGS:  Clear to auscultation bilaterally CHEST:  Unremarkable HEART:  RRR,  PMI not displaced or sustained,S1 and S2 within normal limits, no S3, no S4: no clicks, no rubs, harsh 2/6 high pitched systolic murmur RUSB radiating to carotids and apex.  ABD:  Soft, nontender. BS +, no masses or bruits. No hepatomegaly, no splenomegaly EXT:  2 + pulses throughout, no edema, no cyanosis no clubbing SKIN:  Warm and dry.  No rashes NEURO:  Alert and oriented x 3. Cranial nerves II through XII intact. PSYCH:  Cognitively intact   Laboratory data:  Lab Results  Component Value Date   WBC 7.8 01/24/2021   HGB 12.2 (L) 01/24/2021   HCT 37.6 (L) 01/24/2021   PLT 155 01/24/2021   GLUCOSE 109 (H) 01/23/2021   CHOL 113 12/20/2020   TRIG 106 12/20/2020   HDL 62 12/20/2020   LDLCALC 30 12/20/2020   ALT 33 01/23/2021   AST 23 01/23/2021   NA 139 01/23/2021   K 3.9 01/23/2021   CL 102 01/23/2021   CREATININE 1.00 01/23/2021   BUN 16 01/23/2021   CO2 30 01/23/2021   TSH 2.220 08/21/2017   INR 1.0 01/21/2021    HGBA1C 6.3 (H) 12/20/2020     Labs from Dr. Virgina Jock dated 07/21/16: BUN 24, creatinine 1.5. Other CMET normal.  Dated 01/15/16: cholesterol 185, triglycerides 95, LDL 122, HDL 44. Dated 01/26/18: cholesterol 172, triglycerides 79, HDL 42, LDL 114. Creatinine 1.4. Other chemistries, TSH, CBC normal. Dated 08/13/18: cholesterol 109, triglycerides 51, HDL 61, LDL 38. Creatinine 1.3. ALT and TSH normal. Dated 01/31/19: cholesterol 100, triglycerides 58, HDL 52, LDL 36.  Dated 02/02/20: cholesterol 109, triglycerides 76, HDL 68, LDL 26.  Dated 01/28/21: LFTs normal. Cholesterol 92, triglycerides 80, HDL 48, LDL 28, TSH normal.   Ecg not done today   Echo dated 02/03/17: Study Conclusions  - Left ventricle: The cavity size was normal. Wall thickness was   increased in a pattern of moderate LVH. Systolic function was   normal. The estimated ejection fraction was in the range of 55%   to 60%. Wall motion was normal; there were no regional wall   motion abnormalities. Doppler parameters are consistent with   abnormal left ventricular relaxation (grade 1 diastolic   dysfunction). - Aortic valve: Valve mobility was restricted. There was mild   stenosis. There was mild regurgitation. Valve area (VTI): 1.53   cm^2. Valve area (Vmax): 1.47 cm^2. Valve area (Vmean): 1.48   cm^2.  Impressions:  - Normal LV systolic function; moderate LVH; mild diastolic   dysfunction; calcified aortic valve with mild AS (mean gradient   13 mmHg); mild AI; mild TR.  Myoview 10/03/16: Study Highlights     The left ventricular ejection fraction is mildly decreased (45-54%).  Nuclear stress EF: 53%.  There was no ST segment deviation noted during stress.  Findings consistent with prior myocardial infarction.  This is a low risk study.   Small distal anterior wall / apical infarct no ischemia EF 53% with apical hypokinesis No ischemia Baseline ECG with RBBB   Procedures   Left Heart Cath and Cors/Grafts  Angiography  Conclusion     Prox RCA lesion, 90 %stenosed.  Mid RCA lesion, 100 %stenosed.  Colon Flattery  Cx to Prox Cx lesion, 70 %stenosed.  Prox Cx to Mid Cx lesion, 100 %stenosed.  Ost LAD lesion, 70 %stenosed.  Prox LAD to Mid LAD lesion, 100 %stenosed.  SVG graft was visualized by angiography and is normal in caliber and anatomically normal.  SVG graft was visualized by angiography and is normal in caliber and anatomically normal.  SVG graft was visualized by angiography and is normal in caliber and anatomically normal.  LIMA graft was visualized by angiography and is normal in caliber and anatomically normal.   1. Severe 3 vessel occlusive CAD 2. Patent LIMA to the LAD 3. Patent SVG to the first diagonal 4. Patent SVG to the OM 2 5. Patent SVG to the distal RCA  Plan: there is excellent patency of all his bypass grafts. Unchanged from 2007. Continue medical treatment and consider other causes of his symptoms.     Echo 07/13/20: IMPRESSIONS    1. Left ventricular ejection fraction, by estimation, is 50 to 55%. Left  ventricular ejection fraction by 3D volume is 50 %. The left ventricle has  low normal function. The left ventricle has no regional wall motion  abnormalities. Left ventricular  diastolic parameters are consistent with Grade I diastolic dysfunction  (impaired relaxation).  2. Right ventricular systolic function is normal. The right ventricular  size is normal. There is normal pulmonary artery systolic pressure.  3. Left atrial size was moderately dilated.  4. Right atrial size was mildly dilated.  5. The mitral valve is normal in structure. Mild to moderate mitral valve  regurgitation. No evidence of mitral stenosis.  6. The aortic valve is tricuspid. There is severe calcifcation of the  aortic valve. There is severe thickening of the aortic valve. Aortic valve  regurgitation is mild. Mild to moderate aortic valve stenosis. Aortic  valve area, by  VTI measures 1.89 cm.  Aortic valve mean gradient measures 18.0 mmHg. Aortic valve Vmax measures  2.95 m/s.  7. Aortic dilatation noted. There is mild dilatation of the ascending  aorta, measuring 38 mm.  8. The inferior vena cava is normal in size with greater than 50%  respiratory variability, suggesting right atrial pressure of 3 mmHg.   Event monitor: 06/27/20: Study Highlights   Normal sinus rhythm  Occasional PVCs, multifocal with 4 beat runs of NSVT. Burden 6%  Rare PACs   Echo 12/20/20: IMPRESSIONS    1. Left ventricular ejection fraction, by estimation, is 50 to 55%. The  left ventricle has low normal function. The left ventricle demonstrates  regional wall motion abnormalities (see scoring diagram/findings for  description). Left ventricular diastolic  parameters are indeterminate. There is mild hypokinesis of the left  ventricular, apical inferior wall.  2. Right ventricular systolic function is moderately reduced. The right  ventricular size is normal.  3. Left atrial size was mild to moderately dilated.  4. Right atrial size was mildly dilated.  5. The mitral valve is grossly normal. Mild mitral valve regurgitation.  6. Small Lambl's excrescence vs calcification seen on ventricular aspect  of aortic valve. The aortic valve is calcified. There is severe  calcifcation of the aortic valve. There is severe thickening of the aortic  valve. Aortic valve regurgitation is  mild. Moderate aortic valve stenosis. Aortic valve mean gradient measures  21.0 mmHg.   Comparison(s): No significant change from prior study.   Assessment / Plan:  1.   Coronary disease status post CABG. Negative Myoview in January 2018. Cardiac cath in July 2018 showed  patent grafts.  Continue risk factor modification. On ASA and statin/Repatha.   2. 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 Reported by oncology to be cancer free.   3. Hyperlipidemia. On crestor 20 mg.   Intolerant of Zetia. Now on Repatha  with excellent response.   6. History of RAS - now normotensive. No significant RAS noted on duplex or CT in Nov 2021.   7. Esophageal disease post radiation. Still requiring periodic dilation.   8. Bifascicular block- chronic and asymptomatic. No significant pauses or bradycardia noted on event monitor.   9. Orthostatic hypotension. Chronic. Continue liberal salt intake, elevation of head of bed and support hose. He monitors his BP religiously.  We should avoid any antihypertensive therapy to allow more margin for his orthostasis.   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 Initially planned angiogram but this is on hold given recent events.   11. PVCs - not really symptomatic. Would avoid beta blockers due to hypotension.   12. Acute right MCA stroke in March with emergent thrombectomy and stenting of the right ICA. Right internal capsule CVA in April managed medically. On DAPT per Neuro.   13. Recurrent Aspiration PNA/sepsis.   14. Moderate aortic stenosis. Stable by Echo    Follow up with me in 6 months.

## 2021-02-04 ENCOUNTER — Other Ambulatory Visit: Payer: Self-pay

## 2021-02-04 ENCOUNTER — Ambulatory Visit (INDEPENDENT_AMBULATORY_CARE_PROVIDER_SITE_OTHER): Payer: Medicare Other | Admitting: Cardiology

## 2021-02-04 ENCOUNTER — Encounter: Payer: Self-pay | Admitting: Cardiology

## 2021-02-04 VITALS — BP 150/90 | HR 86 | Ht 73.0 in | Wt 171.6 lb

## 2021-02-04 DIAGNOSIS — I259 Chronic ischemic heart disease, unspecified: Secondary | ICD-10-CM

## 2021-02-04 DIAGNOSIS — I951 Orthostatic hypotension: Secondary | ICD-10-CM | POA: Diagnosis not present

## 2021-02-04 DIAGNOSIS — E7800 Pure hypercholesterolemia, unspecified: Secondary | ICD-10-CM | POA: Diagnosis not present

## 2021-02-04 DIAGNOSIS — I25118 Atherosclerotic heart disease of native coronary artery with other forms of angina pectoris: Secondary | ICD-10-CM

## 2021-02-04 DIAGNOSIS — I350 Nonrheumatic aortic (valve) stenosis: Secondary | ICD-10-CM

## 2021-02-05 ENCOUNTER — Telehealth: Payer: Self-pay

## 2021-02-05 NOTE — Telephone Encounter (Signed)
Spoke with patient's wife [name] and scheduled an in-person Palliative Consult for 02/19/21 @ 9AM  COVID screening was negative. Small dog will put away. Patient lives with wife.  Consent obtained; updated Outlook/Netsmart/Team List and Epic.   Family is aware they may be receiving a call from NP the day before or day of to confirm appointment.

## 2021-02-06 ENCOUNTER — Other Ambulatory Visit: Payer: Self-pay | Admitting: *Deleted

## 2021-02-06 NOTE — Patient Outreach (Signed)
[facility]) Care Management  02/06/2021  MADDON HORTON [birth year] [id number]   Outgoing call placed to member's wife, state he is doing about the same.  Blood pressure still fluctuating, changing positions slowly when he remembers to decrease effects of orthostatic hypotension.  Denies any urgent concerns, encouraged to contact this care manager with questions.  Agrees to follow up within the next month.  Goals Addressed            This Visit's Progress   . [facility] - Find Help in My Community   On track    Timeframe:  Short-Term Goal Priority:  High Start Date:           4/20                  Expected End Date:  5/20                     Follow Up Date 6/6  Barriers: Health Behaviors Other - Dementia    - follow-up on any referrals for help I am given    Why is this important?    Knowing how and where to find help for yourself or family in your neighborhood and community is an important skill.   You will want to take some steps to learn how.    Notes:   4/20 - Referral to [facility] for Palliative Care  5/11 - Unable to place referral to [facility].  Call placed to PCP office to request referral be placed to [facility].  Call placed to [facility] program for more information on participation  5/18 - Confirmed member has home visit with [facility]. Provided information for Driver rehab program, she will speak to member and will call for appointment if he agrees    . [facility] - Keep or Improve My Strength-Stroke   On track    Timeframe:  Long-Range Goal Priority:  Medium Start Date:          4/20                   Expected End Date:   6/20                      Follow Up Date 6/6   - arrange in-home help services - increase activity or exercise time a little every week - know who to call for help if I fall    Why is this important?    Before the stroke you probably did not think much about being safe when you are up and about.   Now, it  may be harder for you to get around.   It may also be easier for you to trip or fall.   It is common to have muscle weakness after a stroke. You may also feel like you cannot control an arm or leg.   It will be helpful to work with a physical therapist to get your strength and muscle control back.   It is good to stay as active as you can. Walking and stretching help you stay strong and flexible.   The physical therapist will develop an exercise program just for you.     Notes:   4/20 - Discussed the possibility of having home health PT for member, not ordered at this time but wife will consider  5/11 - PT was not ordered after recent discharge, wife feels his BP may be too unstable  to participate.  Advised to notify this care manager if they would like to consider  5/18 - Wife report patient is using exercise equipment more in the home, slowly increasing strength    . [facility] - Make and Keep All Appointments   On track    Timeframe:  Short-Term Goal Priority:  High Start Date:           4/20                  Expected End Date:   5/20                    Follow Up Date 6/6  Barriers: Other - Dementia    - call to cancel if needed - keep a calendar with appointment dates    Why is this important?    Part of staying healthy is seeing the doctor for follow-up care.   If you forget your appointments, there are some things you can do to stay on track.    Notes:   4/20 - Conference call placed to Neurology office in attempt to schedule visit, unsuccessful, wife will call back  5/11 - Cardiology 5/16, PCP - 5/24, and Neurology 7/11  5/18 - Confirmed member attended cardiology on 5/16, no changes made to plan of care.  Other follow ups noted above, home visit with palliative care on 5/31      Valente David, RN, MSN [place] Manager [phone]

## 2021-02-11 ENCOUNTER — Ambulatory Visit: Payer: Medicare Other | Admitting: Cardiology

## 2021-02-11 DIAGNOSIS — N1831 Chronic kidney disease, stage 3a: Secondary | ICD-10-CM | POA: Diagnosis not present

## 2021-02-11 DIAGNOSIS — R413 Other amnesia: Secondary | ICD-10-CM | POA: Diagnosis not present

## 2021-02-11 DIAGNOSIS — I951 Orthostatic hypotension: Secondary | ICD-10-CM | POA: Diagnosis not present

## 2021-02-11 DIAGNOSIS — I2510 Atherosclerotic heart disease of native coronary artery without angina pectoris: Secondary | ICD-10-CM | POA: Diagnosis not present

## 2021-02-11 DIAGNOSIS — M1990 Unspecified osteoarthritis, unspecified site: Secondary | ICD-10-CM | POA: Diagnosis not present

## 2021-02-11 DIAGNOSIS — I350 Nonrheumatic aortic (valve) stenosis: Secondary | ICD-10-CM | POA: Diagnosis not present

## 2021-02-11 DIAGNOSIS — D692 Other nonthrombocytopenic purpura: Secondary | ICD-10-CM | POA: Diagnosis not present

## 2021-02-11 DIAGNOSIS — J690 Pneumonitis due to inhalation of food and vomit: Secondary | ICD-10-CM | POA: Diagnosis not present

## 2021-02-11 DIAGNOSIS — I700 Atherosclerosis of aorta: Secondary | ICD-10-CM | POA: Diagnosis not present

## 2021-02-11 DIAGNOSIS — E039 Hypothyroidism, unspecified: Secondary | ICD-10-CM | POA: Diagnosis not present

## 2021-02-11 DIAGNOSIS — Z0000 Encounter for general adult medical examination without abnormal findings: Secondary | ICD-10-CM | POA: Diagnosis not present

## 2021-02-11 DIAGNOSIS — E785 Hyperlipidemia, unspecified: Secondary | ICD-10-CM | POA: Diagnosis not present

## 2021-02-19 ENCOUNTER — Other Ambulatory Visit: Payer: Medicare Other | Admitting: Nurse Practitioner

## 2021-02-19 ENCOUNTER — Other Ambulatory Visit: Payer: Self-pay

## 2021-02-19 VITALS — BP 138/70 | HR 62 | Ht 73.0 in | Wt 170.0 lb

## 2021-02-19 DIAGNOSIS — F0390 Unspecified dementia without behavioral disturbance: Secondary | ICD-10-CM | POA: Diagnosis not present

## 2021-02-19 DIAGNOSIS — I959 Hypotension, unspecified: Secondary | ICD-10-CM | POA: Diagnosis not present

## 2021-02-19 DIAGNOSIS — R2681 Unsteadiness on feet: Secondary | ICD-10-CM

## 2021-02-19 DIAGNOSIS — Z515 Encounter for palliative care: Secondary | ICD-10-CM

## 2021-02-19 NOTE — Progress Notes (Signed)
[facility] Palliative Care Consult Note Telephone: [phone]  Fax: [phone]    Date of encounter: 02/19/21 PATIENT NAME: George Bradley [id number]   [id number] (home)  DOB: 01/23/1941 MRN: [id number]  PRIMARY CARE PROVIDER:    [name], MD,  [place] [address] [phone]  REFERRING PROVIDER:   [name], [place],  [place] [phone]  RESPONSIBLE PARTY:    Contact Information    Name Relation Home Work [place] Spouse [phone]     I met face to face with patient in home, his wife [name] present during visit. Palliative Care was asked to follow this patient by consultation request of  [name], MD to address advance care planning and complex medical decision making. This is the initial visit.                                   ASSESSMENT AND PLAN / RECOMMENDATIONS:   Advance Care Planning/Goals of Care: Goals include to maximize quality of life and symptom management.  Today's visit consisted of building trust and discussions on Palliative care medicine as a specialized medical care for people living with serious illness, aimed at facilitating improved quality of life through symptoms relief, assisting with advance care planning and establishing goals of care. Patient and his wife expressed appreciation for education provided on Palliative care and how it differs from Hospice service. Our advance care planning conversation included a discussion about:     The value and importance of advance care planning   Experiences with loved ones who have been seriously ill or have died   Exploration of personal, cultural or spiritual beliefs that might influence medical decisions   Exploration of goals of care in the event of a sudden injury or illness   Identification and preparation of a healthcare agent   Review  and updating or creation of an  advance directive document .  CODE STATUS:DNR Goals of care: Patient's goal of care is function. Patient verbalize his goals to include staying healthy and maintain as much independence as possible with his day to day activities.   Directive: Patient reiterated desire to not be resuscitated in the event of cardiac or respiratory arrest. Patient has a copy of signed DNR form on [facility] and none in home. DNR form signed for patient today, patient and his wife advised to keep the form in a readily accessible area in his home to be easily accessed when needed.  The need to complete a MOST form was discussed, patient expressed interest in completing the MOST form. Patient and wife asked for opportunity to review the form independently. Blank MOST form left with patient and his wife, we will review and complete at next visit. Questions and concerns were addressed. Patient and his family was encouraged to call with questions and/or concerns. My business card was provided.  I spent 30 minutes providing this consultation. More than 50% of the time in this consultation was spent in counseling and care coordination. -------------------------------------------------------------------------------  Symptom Management/Plan: Unsteady gait: Patient declined physical therapy at this time. Wife report patient not compliant with use of assistive device, report patient would rather hold on to furniture in home for balance. Patient denied any recent fall. Continue to encourage consistent use of cane/walker during ambulation to prevent falls. Discussed  fall prevention strategies to include changing positions slowly and asking for assistance if needed. Patient verbalized understanding. Hypotension: Patient with report of fluctuation in blood pressure readings. My review of the log of his blood pressures in the last week revealed BP range between 204/90 to 61/41. Wife report  Hypotension occurs even when is patient is sitting. Report patient would occassionally report lightheadedness with low blood pressure but at times would have no symptoms. Patient's current medication for blood pressure control include Amlodipine 2.5mg  to 5mg  as needed, Fludrocortisone 0.1mg  daily. Report increasing oral fluid intake has helped to reduce number of occurrence of low pressures, he also uses compression stocking to promote venous return.  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 Patient denied coughing or spluttering during meals. Wife report patient not complaint with dysphagia 3 diet. Continue supportive care, encourage eating small bites during meals, chewing food well before swallowing. We discussed and reviewed safe swallowing practices. Insomnia: condition is improved. Patient report problem staying asleep which is improved since starting Seroquel, report Melatonin has not improved his sleep in the past. Continue current plan of care with Trazodone 50mg  and Seroquel 12.5mg  at bedtime.   Chronic back pain: Patient denied pain during visit today, report patient is aggravated by activity and relieved with rest. His current pain medication regimen include Indomethacin 25mg  BID and tramadol 50mg  twice a day as needed. Patient report only using Indomethacin as needed. Report good relief of pain with med.  Provided general support and encouragement, no other unmet needs identified at time. Dementia: Patient is independent with all his ADLs. Continue supportive care.  Follow up Palliative Care Visit: Palliative care will continue to follow for complex medical decision making, advance care planning, and clarification of goals. Return in about 4 weeks or prn.  PPS: 60%  HOSPICE ELIGIBILITY/DIAGNOSIS: TBD  Chief Complaint: Unsteady gait  History obtained from review of Epic EMR and discussion with Mr. Toman and his wife.  HISTORY OF PRESENT ILLNESS:  George Bradley is a  80 y.o. year old male with multiple medical problem including dementia (FAST 4), neuropathy, hypothyroidism, hyperlipidemia, CAD, GERD, hx of oropharygeal cancer (s/p chemo and radiation), hx of CVAs. Patient with recent history of frequent hospitalizations, had 4 hospitalizations within a period of 6 weeks. Last hospitalization was about 4 weeks ago for sepsis related aspiration pneumonia.  Patient with report of unsteady gait related to ongoing lower extremity weakness due to deconditioning from his comorbid conditions. Condition is aggravated by fluctuations in his blood pressures which he report occassionally causes lightheadedness. Patient denied any recent fall related to orthostatic hypotension. Wife report patient declined using assistive device for ambulation and refuses physical therapy. Patient's wife is a retired Community education officer. Patient voiced no concerns today, he report feeling well. Denied fever, chills, or SOB. Denied any uncontrolled pain.  Reviewed labs from 01/23/2021 Na 139, K 3.9, Cr 1.00, GFR >60, Hgb 12.5, Hct 38.4, wbc 6.8 Reviewed MRI imaging from 01/06/2021  I reviewed available labs, medications, imaging, studies and related documents from the EMR.  Records reviewed and summarized above.   ROS General: NAD EYES: denies acute vision changes ENMT: denies dysphagia Cardiovascular: denies chest pain, denies DOE Pulmonary: denies cough, denies SOB Abdomen: endorses good appetite, denies constipation, endorses continence of bowel GU: denies dysuria, endorses continence of urine MSK:  endorsed weakness, no falls reported Skin: denies rashes Neurological: denies uncontrolled pain, endorsed insomnia Psych: Endorses positive mood Heme/lymph/immuno: denies bruises, abnormal bleeding  Vitals:  02/19/21 1019  BP: 138/70  Pulse: 62  Height: $Remove'6\' 1"'SPRVlWR$  (1.854 m)  Weight: 170 lb (77.1 kg)  SpO2: 93%  BMI (Calculated): 22.43   Physical Exam: Constitutional: NAD General: frail  appearing, cooperative, NAD  EYES: anicteric sclera, no discharge  ENMT: intact hearing, oral mucous membranes moist CV: RRR, no LE edema Pulmonary: LCTA, no increased work of breathing, no cough, room air Abdomen: no ascites GU: deferred MSK: no sarcopenia, moves all extremities, ambulatory Skin: warm and dry, no rashes, scartered ecchymotic areas noted to bilateral arms and legs related to use of blood thiner Neuro: generalized weakness, mild cognitive impairment Psych: non-anxious affect, A and O x 3 Hem/lymph/immuno: no widespread bruising  CURRENT PROBLEM LIST:  Patient Active Problem List   Diagnosis Date Noted  . Sepsis due to undetermined organism ([facility]) 01/24/2021  . Aspiration pneumonitis ([place]) 01/23/2021  . Pneumonia 01/21/2021  . Sepsis ([place]) 12/26/2020  . Acute respiratory failure with hypoxia ([place]) 12/26/2020  . Aspiration pneumonia ([place]) 12/26/2020  . Dementia ([place]) 12/26/2020  . Acute encephalopathy 12/26/2020  . CVA (cerebral vascular accident) ([place]) 12/19/2020  . Acute right MCA stroke ([place]) 12/19/2020  . 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  . Spinal stenosis of lumbosacral region 11/13/2020  . Numbness 11/13/2020  . Weakness of both lower extremities 11/13/2020  . 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 Dizziness 08/18/2017  . 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 Constipation 03/23/2014  . Dysphagia 03/23/2014  . Neuropathy due to chemotherapeutic drug ([place]) 03/23/2014  . Xerostomia 06/10/2013  . Thrombocytopenia ([place]) 06/10/2013  . S/P radiation therapy   . Hypothyroidism 05/27/2012  . Orthostatic hypotension 05/11/2012  . Epigastric pain 05/11/2012  . History of tongue cancer 11/17/2011  . Depression 10/01/2011  . Renal artery stenosis, native, bilateral ([place] [facility]) 09/03/2011  . 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 Angina pectoris ([place]) 02/20/2011   . Hypercholesterolemia 02/20/2011  . Aortic valve stenosis, mild 02/20/2011   PAST MEDICAL HISTORY:  Active Ambulatory Problems    Diagnosis Date Noted  . Angina pectoris ([place]) 02/20/2011  . Hypercholesterolemia 02/20/2011  . Aortic valve stenosis, mild 02/20/2011  . Renal artery stenosis, native, bilateral ([place]) 09/03/2011  . 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 Depression 10/01/2011  . History of tongue cancer 11/17/2011  . Orthostatic hypotension 05/11/2012  . Epigastric pain 05/11/2012  . Hypothyroidism 05/27/2012  . S/P radiation therapy   . Xerostomia 06/10/2013  . Thrombocytopenia ([place]) 06/10/2013  . Constipation 03/23/2014  . Dysphagia 03/23/2014  . Neuropathy due to chemotherapeutic drug ([place]) 03/23/2014  . 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 Dizziness 08/18/2017  . 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  . Spinal stenosis of lumbosacral region 11/13/2020  . Numbness 11/13/2020  . Weakness of both lower extremities 11/13/2020  . CVA (cerebral vascular accident) ([place]) 12/19/2020  . Acute right MCA stroke ([place]) 12/19/2020  . Sepsis ([place]) 12/26/2020  . Acute respiratory failure with hypoxia ([place]) 12/26/2020  . Aspiration pneumonia ([place]) 12/26/2020  . Dementia ([place]) 12/26/2020  . Acute encephalopathy 12/26/2020  . Pneumonia 01/21/2021  . Aspiration pneumonitis ([place]) 01/23/2021  . Sepsis due to undetermined organism ([place]) 01/24/2021   Resolved Ambulatory Problems    Diagnosis Date Noted  . Protein-calorie malnutrition, moderate ([place])   . Oropharyngeal cancer ([place]) 04/15/2011  . Weight loss, abnormal   . Abdominal pain,  acute, periumbilical 76/16/0737   Past Medical History:  Diagnosis Date  . Aortic atherosclerosis ([place])   . Aortic stenosis, mild   . Arrhythmia   . Arthritis   . Bilateral renal artery stenosis  ([place])   . Bladder outlet obstruction   . BPH (benign prostatic hyperplasia)   . Chronic kidney disease   . Coronary artery disease   . First degree heart block   . GERD (gastroesophageal reflux disease)   . Heart murmur   . History of oropharyngeal cancer oncologist-  dr Alvy Bimler--  per last note no recurrance  . History of thrombosis   . History of traumatic head injury   . Hypergammaglobulinemia, unspecified   . Hyperlipidemia   . Hypothyroidism, postop   . Insomnia   . Malignant neoplasm of tongue, unspecified ([place])   . Mild cardiomegaly   . Neuropathy   . Osteoarthritis   . Polyneuropathy   . RBBB (right bundle branch block with left anterior fascicular block)   . Renal artery stenosis ([place])   . Urgency of urination   . Urinary hesitancy   . Weak urinary stream   . Wears hearing aid   . Xerostomia due to radiotherapy    SOCIAL HX:  Social History   Tobacco Use  . Smoking status: Former Smoker    Years: 2.00    Types: Cigars    Quit date: 09/21/2009    Years since quitting: 11.4  . Smokeless tobacco: Former Systems developer  . Tobacco comment: 2 cigars a week  Substance Use Topics  . Alcohol use: Yes    Alcohol/week: 0.0 standard drinks    Comment: rare  wine   FAMILY HX:  Family History  Problem Relation Age of Onset  . Heart disease Father   . Peptic Ulcer Disease Father   . Heart disease Brother   . Heart disease Sister   . Breast cancer Sister   . Dementia Mother   . Breast cancer Mother   . Hyperlipidemia Son     ALLERGIES:  Allergies  Allergen Reactions  . Zetia [Ezetimibe] Other (See Comments)    "made me feel bad"     PERTINENT MEDICATIONS:  Outpatient Encounter Medications as of 02/19/2021  Medication Sig  . amitriptyline (ELAVIL) 25 MG tablet Take 25 mg by mouth at bedtime.  Marland Kitchen amLODipine (NORVASC) 5 MG tablet Take 0.5 tablets (2.5 mg total) by mouth daily as needed (blood pressure on the higher end, no parameter).  Marland Kitchen aspirin EC 81 MG EC tablet Take 1  tablet (81 mg total) by mouth daily. Swallow whole.  . busPIRone (BUSPAR) 5 MG tablet Take 5 mg by mouth 2 (two) times daily as needed (anxiety).  Marland Kitchen donepezil (ARICEPT) 5 MG tablet Take 5 mg by mouth at bedtime.  . Evolocumab (REPATHA SURECLICK) 106 MG/ML SOAJ Inject 140 mg into the skin every 14 (fourteen) days.  . fludrocortisone (FLORINEF) 0.1 MG tablet Take 0.1 mg by mouth daily.  Marland Kitchen gabapentin (NEURONTIN) 300 MG capsule TAKE 3 CAPSULES(900 MG) BY MOUTH THREE TIMES DAILY  . levothyroxine (SYNTHROID, LEVOTHROID) 100 MCG tablet TAKE ONE TABLET BY MOUTH ONCE DAILY BEFORE  BREAKFAST (Patient taking differently: Take 100 mcg by mouth daily before breakfast.)  . LORazepam (ATIVAN) 1 MG tablet TAKE 1 TABLET BY MOUTH THREE TIMES DAILY (Patient taking differently: Take 1 mg by mouth 3 (three) times daily.)  . pilocarpine (SALAGEN) 7.5 MG tablet Take 7.5 mg by mouth 3 (three) times daily.  . QUEtiapine (SEROQUEL)  50 MG tablet Take 1 tablet (50 mg total) by mouth at bedtime.  . rosuvastatin (CRESTOR) 20 MG tablet Take 1 tablet (20 mg total) by mouth daily.  . ticagrelor (BRILINTA) 90 MG TABS tablet Take 1 tablet (90 mg total) by mouth 2 (two) times daily.  . traMADol (ULTRAM) 50 MG tablet Take 50 mg by mouth 2 (two) times daily as needed for moderate pain.    No facility-administered encounter medications on file as of 02/19/2021.   Thank you for the opportunity to participate in the care of Mr. Saraceni.  The palliative care team will continue to follow. Please call our office at [phone] if we can be of additional assistance.   Jari Favre, DNP, AGPCNP-BC  COVID-19 PATIENT SCREENING TOOL Asked and negative response unless otherwise noted:   Have you had symptoms of covid, tested positive or been in contact with someone with symptoms/positive test in the past 5-10 days?

## 2021-02-25 ENCOUNTER — Other Ambulatory Visit: Payer: Self-pay | Admitting: *Deleted

## 2021-02-25 NOTE — Patient Outreach (Signed)
[facility]) Care Management  02/25/2021  TIA GELB 21-Feb-1941 [id number]   Outgoing call placed to member's wife, state member is "well."  Report he did have a day last week where his memory was very bad, but it lasted for a few hours then improved.  Educated on progress and management of dementia, verbalizes understanding.  Denies any urgent concerns, encouraged to contact this care manager with questions.  Agrees to follow up within the next month.  Goals Addressed            This Visit's Progress   . COMPLETED: [facility] - Find Help in My Community   On track    Timeframe:  Short-Term Goal Priority:  High Start Date:           4/20                  Expected End Date:  5/20                     Follow Up Date 6/6  Barriers: Health Behaviors Other - Dementia    - follow-up on any referrals for help I am given    Why is this important?    Knowing how and where to find help for yourself or family in your neighborhood and community is an important skill.   You will want to take some steps to learn how.    Notes:   4/20 - Referral to [facility] for [facility]  5/11 - Unable to place referral to [facility].  Call placed to PCP office to request referral be placed to [facility].  Call placed to [facility] program for more information on participation  5/18 - Confirmed member has home visit with [facility]. Provided information for Driver rehab program, she will speak to member and will call for appointment if he agrees  6/6 - Declines to attend driver's rehab at this time, Has had home visit from [place] [facility]    . [facility] - Keep or Improve My Strength-Stroke   On track    Timeframe:  Long-Range Goal Priority:  Medium Start Date:          4/20                   Expected End Date:   6/20                      Follow Up Date 6/6   - arrange in-home help services - increase activity or exercise time a little every week - know who to call for  help if I fall    Why is this important?    Before the stroke you probably did not think much about being safe when you are up and about.   Now, it may be harder for you to get around.   It may also be easier for you to trip or fall.   It is common to have muscle weakness after a stroke. You may also feel like you cannot control an arm or leg.   It will be helpful to work with a physical therapist to get your strength and muscle control back.   It is good to stay as active as you can. Walking and stretching help you stay strong and flexible.   The physical therapist will develop an exercise program just for you.     Notes:   4/20 - Discussed the possibility of having  home health PT for member, not ordered at this time but wife will consider  5/11 - PT was not ordered after recent discharge, wife feels his BP may be too unstable to participate.  Advised to notify this care manager if they would like to consider  5/18 - Wife report patient is using exercise equipment more in the home, slowly increasing strength  6/6 - Per wife, member is getting stronger, walking better.  Will follow up with neurology on 7/11    . [facility] - Make and Keep All Appointments   On track    Timeframe:  Short-Term Goal Priority:  High Start Date:           6/6            Expected End Date:  7/6                Barriers: Other - Dementia    - call to cancel if needed - keep a calendar with appointment dates    Why is this important?    Part of staying healthy is seeing the doctor for follow-up care.   If you forget your appointments, there are some things you can do to stay on track.    Notes:   4/20 - Conference call placed to Neurology office in attempt to schedule visit, unsuccessful, wife will call back  5/11 - Cardiology 5/16, PCP - 5/24, and Neurology 7/11  5/18 - Confirmed member attended cardiology on 5/16, no changes made to plan of care.  Other follow ups noted above, home visit with  palliative care on 5/31  6/6 - Palliative care visit completed, next one scheduled for 6/28      Valente David, RN, MSN [place] [phone]

## 2021-03-19 ENCOUNTER — Other Ambulatory Visit (HOSPITAL_COMMUNITY): Payer: Self-pay | Admitting: Neuroradiology

## 2021-03-19 ENCOUNTER — Other Ambulatory Visit: Payer: Medicare Other | Admitting: Nurse Practitioner

## 2021-03-19 ENCOUNTER — Other Ambulatory Visit: Payer: Self-pay

## 2021-03-19 DIAGNOSIS — R1312 Dysphagia, oropharyngeal phase: Secondary | ICD-10-CM | POA: Diagnosis not present

## 2021-03-19 DIAGNOSIS — G4700 Insomnia, unspecified: Secondary | ICD-10-CM

## 2021-03-19 DIAGNOSIS — G894 Chronic pain syndrome: Secondary | ICD-10-CM

## 2021-03-19 DIAGNOSIS — I639 Cerebral infarction, unspecified: Secondary | ICD-10-CM

## 2021-03-19 DIAGNOSIS — Z515 Encounter for palliative care: Secondary | ICD-10-CM
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
## 2021-03-27 ENCOUNTER — Other Ambulatory Visit: Payer: Self-pay

## 2021-03-27 NOTE — Patient Outreach (Signed)
[facility]) Care Management  03/27/2021  KEALAN BUCHAN [birth year] [phone]   Telephone outreach to patient to obtain mRS was successfully completed. MRS= 0   [facility] Care Management Assistant

## 2021-03-28 ENCOUNTER — Ambulatory Visit (HOSPITAL_COMMUNITY)
Admission: RE | Admit: 2021-03-28 | Discharge: 2021-03-28 | Disposition: A | Discharge status: Home / Self Care | Payer: Medicare Other | Source: Ambulatory Visit | Attending: Neuroradiology | Admitting: Neuroradiology

## 2021-03-28 ENCOUNTER — Other Ambulatory Visit: Payer: Self-pay

## 2021-03-28 ENCOUNTER — Other Ambulatory Visit: Payer: Self-pay | Admitting: *Deleted

## 2021-03-28 DIAGNOSIS — I639 Cerebral infarction, unspecified: Secondary | ICD-10-CM | POA: Diagnosis not present

## 2021-03-28 NOTE — Patient Outreach (Signed)
[facility]) Care Management  [place]  03/28/2021   RENNER SEBALD 09/10/1941 [id number]   Outgoing call placed to member's wife, state member is "fairly well."  Denies any urgent concerns, encouraged to contact this care manager with questions.    Encounter Medications:  Outpatient Encounter Medications as of 03/28/2021  Medication Sig   amitriptyline (ELAVIL) 25 MG tablet Take 25 mg by mouth at bedtime.   amLODipine (NORVASC) 5 MG tablet Take 0.5 tablets (2.5 mg total) by mouth daily as needed (blood pressure on the higher end, no parameter).   aspirin EC 81 MG EC tablet Take 1 tablet (81 mg total) by mouth daily. Swallow whole.   busPIRone (BUSPAR) 5 MG tablet Take 5 mg by mouth 2 (two) times daily as needed (anxiety).   donepezil (ARICEPT) 5 MG tablet Take 5 mg by mouth at bedtime.   Evolocumab (REPATHA SURECLICK) 295 MG/ML SOAJ Inject 140 mg into the skin every 14 (fourteen) days.   fludrocortisone (FLORINEF) 0.1 MG tablet Take 0.1 mg by mouth daily.   gabapentin (NEURONTIN) 300 MG capsule TAKE 3 CAPSULES(900 MG) BY MOUTH THREE TIMES DAILY   levothyroxine (SYNTHROID, LEVOTHROID) 100 MCG tablet TAKE ONE TABLET BY MOUTH ONCE DAILY BEFORE  BREAKFAST (Patient taking differently: Take 100 mcg by mouth daily before breakfast.)   LORazepam (ATIVAN) 1 MG tablet TAKE 1 TABLET BY MOUTH THREE TIMES DAILY (Patient taking differently: Take 1 mg by mouth 3 (three) times daily.)   pilocarpine (SALAGEN) 7.5 MG tablet Take 7.5 mg by mouth 3 (three) times daily.   QUEtiapine (SEROQUEL) 50 MG tablet Take 1 tablet (50 mg total) by mouth at bedtime.   rosuvastatin (CRESTOR) 20 MG tablet Take 1 tablet (20 mg total) by mouth daily.   ticagrelor (BRILINTA) 90 MG TABS tablet Take 1 tablet (90 mg total) by mouth 2 (two) times daily.   traMADol (ULTRAM) 50 MG tablet Take 50 mg by mouth 2 (two) times daily as needed for moderate pain.    No facility-administered encounter medications  on file as of 03/28/2021.    Functional Status:  In your present state of health, do you have any difficulty performing the following activities: 01/21/2021 01/07/2021  Hearing? Y Y  Comment l left ear  Vision? N N  Difficulty concentrating or making decisions? Y Y  Comment - -  Walking or climbing stairs? N N  Dressing or bathing? Y Y  Doing errands, shopping? N N  Some recent data might be hidden    Fall/Depression Screening: Fall Risk  01/09/2021 07/22/2017 06/02/2017  Falls in the past year? 0 No No  Number falls in past yr: 0 - -  Injury with Fall? 0 - -  Risk for fall due to : Impaired balance/gait;Impaired vision;Mental status change - -   PHQ 2/9 Scores 01/09/2021 11/11/2011  PHQ - 2 Score - 0  Exception Documentation Medical reason -    Assessment:   Care Plan Care Plan : Stroke (Adult)  Updates made by Valente David, RN since 03/28/2021 12:00 AM     Problem: Long-Term Care Planning (Stroke)      Long-Range Goal: Effective Long-Term Care Planning   Start Date: 01/09/2021  Expected End Date: 05/27/2021  This Visit's Progress: On track  Recent Progress: On track  Priority: Medium  Note:   Evidence-based guidance:  Explain to patient and family/caregiver that it is vital to plan long-term changes in care needs; assess caregiver ability and desire to provide  care.  Correlate the discussion of financial planning, intensification of type or level of care and end of life care planning with patient's changing (or declining) function and cognitive ability.  Identify patient's values, goals and preferences to inform future care decisions.  Refer to financial navigator or Education officer, museum for thorough evaluation and resource assistance.  Facilitate family/caregiver meeting, especially when goal is to delay out-of-home placement.  Discuss Medicare or Medicaid, life and long-term care insurance, employee or retirement benefits, personal assets, veterans' benefits, social security or  social security disability insurance as ways to meet costs.   Encourage inclusion of community resources, such as meal delivery, respite care, transportation services, local aging councils, adult day care and support groups.  Encourage investigation of services, such as home health aide, in-home skilled nursing, physical therapy, assisted living, skilled nursing facility or nursing home.  Discuss benefits of additional support referrals that may include spiritual advisor or chaplain, pain or palliative care specialist, hospice care and home healthcare.  Compassionately discuss advance care planning; if patient can make own decisions, encourage completion of advance directives and assist in identification of a proxy decision-maker.   Notes:     Task: Facilitate Planning for Long-Term Care Needs   Due Date: 05/27/2021  Note:   Care Management Activities:    - counseling provided - decision-making supported - family involvement promoted    Notes:       Goals Addressed             This Visit's Progress    [facility] - Keep or Improve My Strength-Stroke   On track    Timeframe:  Long-Range Goal Priority:  Medium Start Date:          4/20                   Expected End Date:   10/20                   Barriers: Health Behaviors    - arrange in-home help services - increase activity or exercise time a little every week - know who to call for help if I fall    Why is this important?   Before the stroke you probably did not think much about being safe when you are up and about.  Now, it may be harder for you to get around.  It may also be easier for you to trip or fall.  It is common to have muscle weakness after a stroke. You may also feel like you cannot control an arm or leg.  It will be helpful to work with a physical therapist to get your strength and muscle control back.  It is good to stay as active as you can. Walking and stretching help you stay strong and flexible.  The  physical therapist will develop an exercise program just for you.     Notes:   4/20 - Discussed the possibility of having home health PT for member, not ordered at this time but wife will consider  5/11 - PT was not ordered after recent discharge, wife feels his BP may be too unstable to participate.  Advised to notify this care manager if they would like to consider  5/18 - Wife report patient is using exercise equipment more in the home, slowly increasing strength  6/6 - Per wife, member is getting stronger, walking better.  Will follow up with neurology on 7/11  7/6 - Wife report ongoing improvement since  having stroke.  Blood pressure remains labile but manageable.        [facility]N - Make and Keep All Appointments   On track    Timeframe:  Short-Term Goal Priority:  High Start Date:           6/6            Expected End Date:  8/6               Barriers: Other - Dementia    - call to cancel if needed - keep a calendar with appointment dates    Why is this important?   Part of staying healthy is seeing the doctor for follow-up care.  If you forget your appointments, there are some things you can do to stay on track.    Notes:   4/20 - Conference call placed to Neurology office in attempt to schedule visit, unsuccessful, wife will call back  5/11 - Cardiology 5/16, PCP - 5/24, and Neurology 7/11  5/18 - Confirmed member attended cardiology on 5/16, no changes made to plan of care.  Other follow ups noted above, home visit with palliative care on 5/31  6/6 - Palliative care visit completed, next one scheduled for 6/28  7/7 - Palliative visit on 6/28 complete, PCP office visit scheduled for 7/11, no transportation needs identified         Plan:  Follow-up: Patient agrees to Care Plan and Follow-up. Follow-up in 1 month(s).   Valente David, [place], MSN [place] [phone]

## 2021-04-01 ENCOUNTER — Encounter: Payer: Self-pay | Admitting: Neurology

## 2021-04-01 ENCOUNTER — Ambulatory Visit (INDEPENDENT_AMBULATORY_CARE_PROVIDER_SITE_OTHER): Payer: Medicare Other | Admitting: Neurology

## 2021-04-01 ENCOUNTER — Telehealth (HOSPITAL_COMMUNITY): Payer: Self-pay

## 2021-04-01 ENCOUNTER — Other Ambulatory Visit (HOSPITAL_COMMUNITY): Payer: Self-pay | Admitting: Neuroradiology

## 2021-04-01 VITALS — BP 186/87 | HR 53 | Ht 73.0 in | Wt 177.0 lb

## 2021-04-01 DIAGNOSIS — I951 Orthostatic hypotension: Secondary | ICD-10-CM | POA: Diagnosis not present

## 2021-04-01 DIAGNOSIS — I259 Chronic ischemic heart disease, unspecified: Secondary | ICD-10-CM

## 2021-04-01 DIAGNOSIS — E785 Hyperlipidemia, unspecified: Secondary | ICD-10-CM | POA: Diagnosis not present

## 2021-04-01 DIAGNOSIS — I639 Cerebral infarction, unspecified: Secondary | ICD-10-CM | POA: Diagnosis not present

## 2021-04-01 DIAGNOSIS — R030 Elevated blood-pressure reading, without diagnosis of hypertension: Secondary | ICD-10-CM | POA: Diagnosis not present

## 2021-04-01 DIAGNOSIS — D472 Monoclonal gammopathy: Secondary | ICD-10-CM

## 2021-04-01 DIAGNOSIS — R413 Other amnesia: Secondary | ICD-10-CM | POA: Diagnosis not present

## 2021-04-01 DIAGNOSIS — M4316 Spondylolisthesis, lumbar region: Secondary | ICD-10-CM

## 2021-04-01 DIAGNOSIS — R269 Unspecified abnormalities of gait and mobility: Secondary | ICD-10-CM | POA: Diagnosis not present

## 2021-04-01 DIAGNOSIS — N1831 Chronic kidney disease, stage 3a: Secondary | ICD-10-CM | POA: Diagnosis not present

## 2021-04-01 DIAGNOSIS — J690 Pneumonitis due to inhalation of food and vomit: Secondary | ICD-10-CM | POA: Diagnosis not present

## 2021-04-01 DIAGNOSIS — I517 Cardiomegaly: Secondary | ICD-10-CM | POA: Diagnosis not present

## 2021-04-01 DIAGNOSIS — R208 Other disturbances of skin sensation: Secondary | ICD-10-CM

## 2021-04-01 DIAGNOSIS — G6289 Other specified polyneuropathies: Secondary | ICD-10-CM | POA: Diagnosis not present

## 2021-04-01 DIAGNOSIS — I2510 Atherosclerotic heart disease of native coronary artery without angina pectoris: Secondary | ICD-10-CM | POA: Diagnosis not present

## 2021-04-01 DIAGNOSIS — M4807 Spinal stenosis, lumbosacral region: Secondary | ICD-10-CM | POA: Diagnosis not present

## 2021-04-01 DIAGNOSIS — R627 Adult failure to thrive: Secondary | ICD-10-CM | POA: Diagnosis not present

## 2021-04-01 DIAGNOSIS — I350 Nonrheumatic aortic (valve) stenosis: Secondary | ICD-10-CM | POA: Diagnosis not present

## 2021-04-01 DIAGNOSIS — I6990 Unspecified sequelae of unspecified cerebrovascular disease: Secondary | ICD-10-CM | POA: Diagnosis not present

## 2021-04-01 DIAGNOSIS — I700 Atherosclerosis of aorta: Secondary | ICD-10-CM | POA: Diagnosis not present

## 2021-04-01 MED ORDER — TRAMADOL HCL 50 MG PO TABS: 50.0000 mg | ORAL_TABLET | Freq: Two times a day (BID) | ORAL | 5 refills | Status: DC | PRN

## 2021-04-01 NOTE — Telephone Encounter (Signed)
Called to schedule consult, no answer, left vm. AW  
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
## 2021-04-04 ENCOUNTER — Ambulatory Visit (HOSPITAL_COMMUNITY): Admission: RE | Admit: 2021-04-04 | Payer: Medicare Other | Source: Ambulatory Visit

## 2021-04-04 ENCOUNTER — Encounter (HOSPITAL_COMMUNITY): Payer: Self-pay

## 2021-04-05 ENCOUNTER — Telehealth (HOSPITAL_COMMUNITY): Payer: Self-pay | Admitting: Radiology

## 2021-04-05 NOTE — Telephone Encounter (Signed)
Returned call to pt's wife. Pt had consult scheduled with Dr. Debbrah Alar but had to cancel due to being sick. Wants to reschedule. I left a VM for her to call back to reschedule. JM

## 2021-04-11 ENCOUNTER — Ambulatory Visit (HOSPITAL_COMMUNITY)
Admission: RE | Admit: 2021-04-11 | Discharge: 2021-04-11 | Disposition: A | Discharge status: Home / Self Care | Payer: Medicare Other | Source: Ambulatory Visit | Attending: Neuroradiology | Admitting: Neuroradiology

## 2021-04-11 ENCOUNTER — Other Ambulatory Visit: Payer: Self-pay

## 2021-04-11 DIAGNOSIS — I63231 Cerebral infarction due to unspecified occlusion or stenosis of right carotid arteries: Secondary | ICD-10-CM | POA: Diagnosis not present

## 2021-04-11 DIAGNOSIS — Z95828 Presence of other vascular implants and grafts: Secondary | ICD-10-CM | POA: Diagnosis not present

## 2021-04-11 DIAGNOSIS — I639 Cerebral infarction, unspecified: Secondary | ICD-10-CM

## 2021-04-11 DIAGNOSIS — I63511 Cerebral infarction due to unspecified occlusion or stenosis of right middle cerebral artery: Secondary | ICD-10-CM | POA: Diagnosis not present
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
## 2021-04-15 ENCOUNTER — Telehealth: Payer: Self-pay | Admitting: Cardiology

## 2021-04-15 NOTE — Telephone Encounter (Signed)
New Message:   Patient talked to his primary doctor this morning and was told to call and get appointment with his Cardiologist.  I made first available appointment with a PA on 05-17-21-Patient would like to be seen sooner.   Pt c/o swelling: STAT is pt has developed SOB within 24 hours  If swelling, where is the swelling located? Both legs  How much weight have you gained and in what time span? 3 lbs since last week  Have you gained 3 pounds in a day or 5 pounds in a week? 3 lbs in a week  Do you have a log of your daily weights (if so, list)? no  Are you currently taking a fluid pill? no  Are you currently SOB? no  Have you traveled recently? no Pt

## 2021-04-15 NOTE — Telephone Encounter (Signed)
He will not tolerate diuretics with his marked orthostatic hypotension. Recommend he elevate feet. Wear compression hose. If swelling persists will need to be seen.   Dohn Stclair Martinique MD, [facility]

## 2021-04-15 NOTE — Telephone Encounter (Signed)
Returned call to pt he states that both legs have swelling up to the calf "but mostly in the ankles". Does not elevate LE, he will start today. He states that his BP "is all over the place.Marland Kitchen...from 80's/40's 120's thein it can go up to 210's/70's.wife does not think that this is fluid from eating sodium. Pt does not take a fluid pill. Taking all medications as ordered. Should we add medication? Please advise

## 2021-04-16 ENCOUNTER — Encounter: Payer: Self-pay | Admitting: Cardiology

## 2021-04-16 ENCOUNTER — Other Ambulatory Visit: Payer: Self-pay

## 2021-04-16 ENCOUNTER — Ambulatory Visit (INDEPENDENT_AMBULATORY_CARE_PROVIDER_SITE_OTHER): Payer: Medicare Other | Admitting: Cardiology

## 2021-04-16 VITALS — BP 126/68 | HR 56 | Ht 73.0 in | Wt 181.6 lb

## 2021-04-16 DIAGNOSIS — I25118 Atherosclerotic heart disease of native coronary artery with other forms of angina pectoris: Secondary | ICD-10-CM

## 2021-04-16 DIAGNOSIS — I259 Chronic ischemic heart disease, unspecified: Secondary | ICD-10-CM

## 2021-04-16 DIAGNOSIS — I951 Orthostatic hypotension: Secondary | ICD-10-CM | POA: Diagnosis not present

## 2021-04-16 DIAGNOSIS — I350 Nonrheumatic aortic (valve) stenosis: Secondary | ICD-10-CM

## 2021-04-16 DIAGNOSIS — I701 Atherosclerosis of renal artery: Secondary | ICD-10-CM

## 2021-04-16 DIAGNOSIS — E7800 Pure hypercholesterolemia, unspecified: Secondary | ICD-10-CM | POA: Diagnosis not present

## 2021-04-16 NOTE — Telephone Encounter (Signed)
Called an spoke with pt he states that he does elevate and follows Na+ restricted diet. He does not wear compression stockings. Scheduled appointment with Dr Martinique today to discuss this. He will arrive early for check-in

## 2021-04-16 NOTE — Progress Notes (Signed)
Subjective:   George Bradley is seen for evaluation of lower extremity swelling.   He has a history of CAD s/pcoronary artery bypass grafting in 2000. Cardiac catheterization was in 2007 and showed patent saphenous vein grafts x3 with a patent left internal mammary artery and well-preserved LV function. His EF is 55%. His last nuclear stress test in January 2018 showed a small fixed apical defect with normal ejection fraction. No ischemia. Echo in April showed normal EF and mild AS. Due to progressive symptoms cardiac cath was repeated in July 2018 and all grafts were still patent with no changed since 2007.   He also has a history of orthostatic hypotension and has persistent lightheadedness. He does note that increase salt intake helps. He has been on no antihypertensive therapy. He's had a history of what sounds like a Maze procedure when he lived in [place]. His other problems include hyperlipidemia, insomnia, oral cancer, and a history of depression. 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 He has had esophageal dilitation several times in the past. He is followed by oncology without recurrent tongue CA noted. 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   He was seen in October by Truitt Merle NP after experiencing a spell with possible fall. BP was labile. Echo showed mild to moderate AS and MR. EF was normal. Event monitor showed moderate PVCs with burden of 6%. Renal duplex showed mild right RAS. Incidental finding of severe SMA stenosis. He was seen in consultation with Dr Oneida Alar and CT angiogram of abdomen/pelvis was planned.   He presented to [facility] in March 2022 with concerns for generalized weakness: bilateral leg weakness, tremulous movements, and vision changes that occurred at home in "spells" since 3/29. MRI at [facility] revealed two small acute right MCA territory strokes. Neurology was consulted and requested CT angio head and neck with immediate transfer to [facility]. 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 On transfer from [place] to [facility], EMS noted patient to have generalized weakness with initial assessment. During transfer, EMS noticed an acute neurologic change where the patient had sudden left arm weakness and left facial droop. He was brought straight to the ED and a Code Stroke was activated. Imaging was repeated revealing a new embolus at the the right MCA bifurcation and IR was activated for thrombectomy. Patient had successful mechanical thrombectomy of the right MCA as well as right carotid with rescue right carotid angioplasty and stenting and was admitted to the intensive care unit where blood pressure was tightly controlled.  He was extubated and did well.  He had no focal deficits on exam except diminished fine finger movements on the left.  NIH stroke scale was 0. Echo showed good LV function but moderate RV dysfunction.  He was able to ambulate independently.  He was seen by physical occupational and speech therapy and recommended discharge home with outpatient therapies. He was advised to continue aspirin and Brilinta for a few months following his fresh carotid stent and maintain aggressive risk factor modification. He was DC on April 1.  He was readmitted 4/6-10 He developed shaking chills, increased confusion, and lethargy.   He has chronic dysphagia and often coughs and chokes while eating or drinking.  EMS was called and found the patient to have oxygen saturation of 77% and heart rate 140.  He was treated with 300 cc of saline and started 4 L/min  supplemental oxygen prior to arrival in the ED. Upon arrival to the ED, patient is found to be febrile to 40.3 C, saturating mid 90s on 4 L/min of supplemental oxygen, tachypneic, tachycardic, and with blood pressure 125/71.    Lactic acid was 2.0 and then 2.5.  COVID-19 PCR is negative.  CT chest, abdomen, and pelvis is  concerning for multifocal pneumonia.  Patient was admitted for severe sepsis and acute hypoxic respiratory failure secondary to pneumonia. Patient was requiring 4 L of supplemental oxygen.  He was started on IV antibiotics (vancomycin and cefepime).  MRSA nares negative, vancomycin discontinued.  Patient was continued on cefepime and Flagyl for aspiration pneumonia.  Blood cultures no growth,  procalcitonin levels are normal.  Repeat chest x-ray shows improving pneumonia.  Patient weaned down on room air,  sats 93%.  Patient has ambulated in the hallway without need for oxygen.  His O2 saturation remained above 93%.  Patient feels better and want to be discharged.  Patient is being discharged home on Augmentin 875 twice a day for 7 days to complete total 10-day treatment.  Patient will follow up outpatient speech and swallow evaluation.  He was readmitted 4/17-18 with a new CVA. He reported new onset of left-sided weakness.On the day of admission he had persistent ambulatory dysfunction with left lower extremity weakness.  Progressive symptoms to the point where he was unable to move and became unresponsive.  When EMS arrived his oximetry was 79%, and his blood pressure systolic was 60. In the emergency department blood pressure 145/87, heart rate 83, temperature 98.4, respiratory rate 12, oxygenation 91%. Patient had decreased strength in the left lower extremity.Head CT with 1 cm hypodensity at the right putamen, age-indeterminate, could reflect acute to subacute ischemic infarct. Brain MRI with 14 mm acute infarct in the right internal capsule/lentiform nucleus.  Chronic cortical infarcts within the left occipital lobe.  Small chronic infarct within the left cerebellar hemisphere.  Patient's condition improved, he received intravenous fluids for his orthostatic hypotension. Neurology recommended continue with dual antiplatelet therapy with aspirin and ticagrelor.  He was readmitted 5/2-5/5 with aspiration  PNA and sepsis. Treated with antibiotics.   Recently he noted his ankles were swelling. He did start wearing compression socks. He still has some cough and thick mucus (s/p prior RT therapy). Takes amlodipine about 2-3 times a week when BP gets real high. He complains of insomnia. Nothing has helped. Bed is on an incline. Complains of pain in anterior thighs with walking. No chest pain.  Current Outpatient Medications  Medication Sig Dispense Refill   amLODipine (NORVASC) 5 MG tablet Take 0.5 tablets (2.5 mg total) by mouth daily as needed (blood pressure on the higher end, no parameter).     aspirin EC 81 MG EC tablet Take 1 tablet (81 mg total) by mouth daily. Swallow whole. 30 tablet 11   busPIRone (BUSPAR) 5 MG tablet Take 5 mg by mouth 2 (two) times daily as needed (anxiety).     donepezil (ARICEPT) 5 MG tablet Take 5 mg by mouth at bedtime.     Evolocumab (REPATHA SURECLICK) [id number] MG/ML SOAJ Inject 140 mg into the skin every 14 (fourteen) days. 2 mL 11   gabapentin (NEURONTIN) 300 MG capsule TAKE 3 CAPSULES(900 MG) BY MOUTH THREE TIMES DAILY 270 capsule 0   levothyroxine (SYNTHROID, LEVOTHROID) 100 MCG tablet TAKE ONE TABLET BY MOUTH ONCE DAILY BEFORE  BREAKFAST (Patient taking differently: Take 100 mcg by mouth daily before breakfast.) 90 tablet 0  LORazepam (ATIVAN) 1 MG tablet TAKE 1 TABLET BY MOUTH THREE TIMES DAILY (Patient taking differently: Take 1 mg by mouth 3 (three) times daily.) 90 tablet 0   pilocarpine (SALAGEN) 7.5 MG tablet Take 7.5 mg by mouth 3 (three) times daily.     QUEtiapine (SEROQUEL) 50 MG tablet Take 1 tablet (50 mg total) by mouth at bedtime. 30 tablet 0   rosuvastatin (CRESTOR) 20 MG tablet Take 1 tablet (20 mg total) by mouth daily. 90 tablet 3   traMADol (ULTRAM) 50 MG tablet Take 1 tablet (50 mg total) by mouth 2 (two) times daily as needed for moderate pain. 90 tablet 5   No current facility-administered medications for this visit.    Allergies  Allergen  Reactions   Zetia [Ezetimibe] Other (See Comments)    "made me feel bad"    Patient Active Problem List   Diagnosis Date Noted   Gait disturbance 04/01/2021   Dysesthesia 04/01/2021   Sepsis due to undetermined organism ([place]) 01/24/2021   Aspiration pneumonitis ([place]) 01/23/2021   Pneumonia 01/21/2021   Sepsis ([place]) 12/26/2020   Acute respiratory failure with hypoxia ([place]) 12/26/2020   Aspiration pneumonia ([place]) 12/26/2020   Dementia ([place]) 12/26/2020   Acute encephalopathy 12/26/2020   Cerebrovascular accident (CVA) ([place]) 12/19/2020   Acute right MCA stroke ([place]) 12/19/2020   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   Spinal stenosis of lumbosacral region 11/13/2020   Numbness 11/13/2020   Weakness of both lower extremities 11/13/2020   Radiation-induced esophageal stricture 07/05/2019   Dizziness 08/18/2017   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   Constipation 03/23/2014   Dysphagia 03/23/2014   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/23/2014   Xerostomia 06/10/2013   Thrombocytopenia ([place]) 06/10/2013   S/P radiation therapy    Hypothyroidism 05/27/2012   Orthostatic hypotension 05/11/2012   Epigastric pain 05/11/2012   History of tongue cancer 11/17/2011   Depression 10/01/2011   Renal artery stenosis, native, bilateral ([place]) 09/03/2011   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   Angina pectoris ([place]) 02/20/2011   Hypercholesterolemia 02/20/2011   Aortic valve stenosis, mild 02/20/2011    Social History   Tobacco Use  Smoking Status Former   Types: Cigars   Quit date: 09/21/2009   Years since quitting: 11.5  Smokeless Tobacco Former  Tobacco Comments   2 cigars a week    Social History   Substance and Sexual Activity  Alcohol Use Yes   Alcohol/week: 0.0 standard drinks   Comment: rare  wine    Family History  Problem Relation Age of Onset   Heart disease Father     Peptic Ulcer Disease Father    Heart disease Brother    Heart disease Sister    Breast cancer Sister    Dementia Mother    Breast cancer Mother    Hyperlipidemia Son     Review of Systems:   As noted in history of present illness.  All other systems were reviewed and are negative.   Physical Exam:    BP 126/68 (BP Location: Left Arm, Patient Position: Sitting, Cuff Size: Normal)   Pulse (!) 56   Ht '6\' 1"'$  (1.854 m)   Wt 181 lb 9.6 oz (82.4 kg)   SpO2 94%   BMI 23.96 kg/m    GENERAL:  Well appearing thin WM in NAD HEENT:  PERRL, EOMI, sclera are clear. Oropharynx is clear. NECK:  No jugular venous distention, carotid upstroke  brisk and symmetric, no bruits, no thyromegaly or adenopathy LUNGS:  Clear to auscultation bilaterally CHEST:  Unremarkable HEART:  RRR,  PMI not displaced or sustained,S1 and S2 within normal limits, no S3, no S4: no clicks, no rubs, harsh 2/6 high pitched systolic murmur RUSB radiating to carotids and apex.  ABD:  Soft, nontender. BS +, no masses or bruits. No hepatomegaly, no splenomegaly EXT:  2 + pulses throughout, no edema, no cyanosis no clubbing SKIN:  Warm and dry.  No rashes NEURO:  Alert and oriented x 3. Cranial nerves II through XII intact. PSYCH:  Cognitively intact   Laboratory data:  Lab Results  Component Value Date   WBC 7.8 01/24/2021   HGB 12.2 (L) 01/24/2021   HCT 37.6 (L) 01/24/2021   PLT 155 01/24/2021   GLUCOSE 109 (H) 01/23/2021   CHOL 113 12/20/2020   TRIG 106 12/20/2020   HDL 62 12/20/2020   LDLCALC 30 12/20/2020   ALT 33 01/23/2021   AST 23 01/23/2021   NA 139 01/23/2021   K 3.9 01/23/2021   CL 102 01/23/2021   CREATININE 1.00 01/23/2021   BUN 16 01/23/2021   CO2 30 01/23/2021   TSH 2.220 08/21/2017   INR 1.0 01/21/2021   HGBA1C 6.3 (H) 12/20/2020     Labs from Dr. Virgina Jock dated 07/21/16: BUN 24, creatinine 1.5. Other CMET normal.  Dated 01/15/16: cholesterol 185, triglycerides 95, LDL 122, HDL 44. Dated  01/26/18: cholesterol 172, triglycerides 79, HDL 42, LDL 114. Creatinine 1.4. Other chemistries, TSH, CBC normal. Dated 08/13/18: cholesterol 109, triglycerides 51, HDL 61, LDL 38. Creatinine 1.3. ALT and TSH normal. Dated 01/31/19: cholesterol 100, triglycerides 58, HDL 52, LDL 36.  Dated 02/02/20: cholesterol 109, triglycerides 76, HDL 68, LDL 26.  Dated 01/28/21: LFTs normal. Cholesterol 92, triglycerides 80, HDL 48, LDL 28, TSH normal.   Ecg not done today   Echo dated 02/03/17: Study Conclusions   - Left ventricle: The cavity size was normal. Wall thickness was   increased in a pattern of moderate LVH. Systolic function was   normal. The estimated ejection fraction was in the range of 55%   to 60%. Wall motion was normal; there were no regional wall   motion abnormalities. Doppler parameters are consistent with   abnormal left ventricular relaxation (grade 1 diastolic   dysfunction). - Aortic valve: Valve mobility was restricted. There was mild   stenosis. There was mild regurgitation. Valve area (VTI): 1.53   cm^2. Valve area (Vmax): 1.47 cm^2. Valve area (Vmean): 1.48   cm^2.   Impressions:   - Normal LV systolic function; moderate LVH; mild diastolic   dysfunction; calcified aortic valve with mild AS (mean gradient   13 mmHg); mild AI; mild TR.  Myoview 10/03/16: Study Highlights     The left ventricular ejection fraction is mildly decreased (45-54%). Nuclear stress EF: 53%. There was no ST segment deviation noted during stress. Findings consistent with prior myocardial infarction. This is a low risk study.   Small distal anterior wall / apical infarct no ischemia EF 53% with apical hypokinesis No ischemia Baseline ECG with RBBB   Procedures   Left Heart Cath and Cors/Grafts Angiography  Conclusion     Prox RCA lesion, 90 %stenosed. Mid RCA lesion, 100 %stenosed. Ost Cx to Prox Cx lesion, 70 %stenosed. Prox Cx to Mid Cx lesion, 100 %stenosed. Ost LAD lesion, 70  %stenosed. Prox LAD to Mid LAD lesion, 100 %stenosed. SVG graft was visualized by angiography and  is normal in caliber and anatomically normal. SVG graft was visualized by angiography and is normal in caliber and anatomically normal. SVG graft was visualized by angiography and is normal in caliber and anatomically normal. LIMA graft was visualized by angiography and is normal in caliber and anatomically normal.   1. Severe 3 vessel occlusive CAD 2. Patent LIMA to the LAD 3. Patent SVG to the first diagonal 4. Patent SVG to the OM 2 5. Patent SVG to the distal RCA   Plan: there is excellent patency of all his bypass grafts. Unchanged from 2007. Continue medical treatment and consider other causes of his symptoms.      Echo 07/13/20: IMPRESSIONS     1. Left ventricular ejection fraction, by estimation, is 50 to 55%. Left  ventricular ejection fraction by 3D volume is 50 %. The left ventricle has  low normal function. The left ventricle has no regional wall motion  abnormalities. Left ventricular  diastolic parameters are consistent with Grade I diastolic dysfunction  (impaired relaxation).   2. Right ventricular systolic function is normal. The right ventricular  size is normal. There is normal pulmonary artery systolic pressure.   3. Left atrial size was moderately dilated.   4. Right atrial size was mildly dilated.   5. The mitral valve is normal in structure. Mild to moderate mitral valve  regurgitation. No evidence of mitral stenosis.   6. The aortic valve is tricuspid. There is severe calcifcation of the  aortic valve. There is severe thickening of the aortic valve. Aortic valve  regurgitation is mild. Mild to moderate aortic valve stenosis. Aortic  valve area, by VTI measures 1.89 cm.  Aortic valve mean gradient measures 18.0 mmHg. Aortic valve Vmax measures  2.95 m/s.   7. Aortic dilatation noted. There is mild dilatation of the ascending  aorta, measuring 38 mm.   8. The  inferior vena cava is normal in size with greater than 50%  respiratory variability, suggesting right atrial pressure of 3 mmHg.   Event monitor: 06/27/20: Study Highlights  Normal sinus rhythm Occasional PVCs, multifocal with 4 beat runs of NSVT. Burden 6% Rare PACs   Echo 12/20/20: IMPRESSIONS     1. Left ventricular ejection fraction, by estimation, is 50 to 55%. The  left ventricle has low normal function. The left ventricle demonstrates  regional wall motion abnormalities (see scoring diagram/findings for  description). Left ventricular diastolic   parameters are indeterminate. There is mild hypokinesis of the left  ventricular, apical inferior wall.   2. Right ventricular systolic function is moderately reduced. The right  ventricular size is normal.   3. Left atrial size was mild to moderately dilated.   4. Right atrial size was mildly dilated.   5. The mitral valve is grossly normal. Mild mitral valve regurgitation.   6. Small Lambl's excrescence vs calcification seen on ventricular aspect  of aortic valve. The aortic valve is calcified. There is severe  calcifcation of the aortic valve. There is severe thickening of the aortic  valve. Aortic valve regurgitation is  mild. Moderate aortic valve stenosis. Aortic valve mean gradient measures  21.0 mmHg.   Comparison(s): No significant change from prior study.    Assessment / Plan:  1.   Coronary disease status post CABG. Negative Myoview in January 2018. Cardiac cath in July 2018 showed patent grafts. No angina.  Continue risk factor modification. On ASA and statin/Repatha.   2. 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 Reported  by oncology to be cancer free.   3. Hyperlipidemia. On crestor 20 mg.  Intolerant of Zetia. Now on Repatha  with excellent response.   6. History of RAS - now normotensive. No significant RAS noted on duplex or CT in Nov 2021.   7. Esophageal disease post radiation. Still  requiring periodic dilation.   8. Bifascicular block- chronic and asymptomatic. No significant pauses or bradycardia noted on event monitor.   9. Orthostatic hypotension. Chronic. Continue liberal salt intake, elevation of head of bed and support hose. I am not really impressed that he has any swelling today. He is not significantly orthostatic today. He monitors his BP religiously. Try and minimize use of antihypertensives.   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 Initially planned angiogram but this is on hold given recent events.   11. PVCs - not really symptomatic. Would avoid beta blockers due to hypotension.   12. Acute right MCA stroke in March with emergent thrombectomy and stenting of the right ICA. Right internal capsule CVA in April managed medically. On DAPT per Neuro.   13. Recurrent Aspiration PNA/sepsis. Improved.   14. Moderate aortic stenosis. Stable by Echo    Follow up with me in 4 months.

## 2021-04-24 ENCOUNTER — Other Ambulatory Visit: Payer: Self-pay | Admitting: *Deleted

## 2021-04-24 DIAGNOSIS — F0281 Dementia in other diseases classified elsewhere with behavioral disturbance: Secondary | ICD-10-CM

## 2021-04-24 NOTE — Patient Outreach (Signed)
[facility]) Care Management  04/24/2021  SHERI HOTTINGER September 03, 1941 [id number]   Outgoing call placed to member's wife, state his is "ok."  Feels he is slightly better physically but still declining mentally.  Denies any urgent concerns, encouraged to contact this care manager with questions.  Agrees to follow up within the next month.   Goals Addressed             This Visit's Progress    THN - Find Help in My Community (caregiver burnout resources)       Timeframe:  Short-Term Goal Priority:  High Start Date:           8/3                Expected End Date: 9/3    Barriers: Health Behaviors Other - Dementia    - follow-up on any referrals for help I am given    Why is this important?   Knowing how and where to find help for yourself or family in your neighborhood and community is an important skill.  You will want to take some steps to learn how.    Notes:   4/20 - Referral to [facility] for Palliative Care  5/11 - Unable to place referral to [facility].  Call placed to PCP office to request referral be placed to [place].  Call placed to [facility] program for more information on participation  5/18 - Confirmed member has home visit with [facility]. Provided information for Driver rehab program, she will speak to member and will call for appointment if he agrees  6/6 - Declines to attend driver's rehab at this time, Has had home visit from [place]  8/3 - Wife request resources for dementia caregiver support group.  Will place referral to [place] or [facility]St[facility]   On track    Timeframe:  Long-Range Goal Priority:  Medium Start Date:          4/20                   Expected End Date:   10/20                   Barriers: Health Behaviors    - arrange in-home help services - increase activity or exercise time a little every week - know who to call for help if I fall    Why is this important?    Before the stroke you probably did not think much about being safe when you are up and about.  Now, it may be harder for you to get around.  It may also be easier for you to trip or fall.  It is common to have muscle weakness after a stroke. You may also feel like you cannot control an arm or leg.  It will be helpful to work with a physical therapist to get your strength and muscle control back.  It is good to stay as active as you can. Walking and stretching help you stay strong and flexible.  The physical therapist will develop an exercise program just for you.     Notes:   4/20 - Discussed the possibility of having home health PT for member, not ordered at this time but wife will consider  5/11 - PT was not ordered after recent discharge, wife feels his BP may be too unstable to participate.  Advised to notify this  care manager if they would like to consider  5/18 - Wife report patient is using exercise equipment more in the home, slowly increasing strength  6/6 - Per wife, member is getting stronger, walking better.  Will follow up with neurology on 7/11  7/6 - Wife report ongoing improvement since having stroke.  Blood pressure remains labile but manageable.    8/3 - Per wife, member now able to walk without using walker, steadily improving.     THN - Make and Keep All Appointments   On track    Timeframe:  Short-Term Goal Priority:  High Start Date:           6/6            Expected End Date:  8/6               Barriers: Other - Dementia    - call to cancel if needed - keep a calendar with appointment dates    Why is this important?   Part of staying healthy is seeing the doctor for follow-up care.  If you forget your appointments, there are some things you can do to stay on track.    Notes:   4/20 - Conference call placed to Neurology office in attempt to schedule visit, unsuccessful, wife will call back  5/11 - Cardiology 5/16, PCP - 5/24, and Neurology  7/11  5/18 - Confirmed member attended cardiology on 5/16, no changes made to plan of care.  Other follow ups noted above, home visit with palliative care on 5/31  6/6 - Palliative care visit completed, next one scheduled for 6/28  7/7 - Palliative visit on 6/28 complete, PCP office visit scheduled for 7/11, no transportation needs identified  8/3 - PCP appointment completed as well as visit with cardiology due to extremity swelling.  Using compression socks and elevating legs for relief.       Valente David, [place], MSN [place] [phone]

## 2021-04-25 NOTE — Addendum Note (Signed)
Addended byValente David on: 04/25/2021 04:54 PM   Modules accepted: Orders

## 2021-04-27 ENCOUNTER — Other Ambulatory Visit: Payer: Self-pay

## 2021-04-27 ENCOUNTER — Inpatient Hospital Stay (HOSPITAL_COMMUNITY)
Admission: EM | Admit: 2021-04-27 | Discharge: 2021-04-30 | DRG: 067 | Disposition: A | Discharge status: Home / Self Care | Payer: Medicare Other | Attending: Internal Medicine | Admitting: Internal Medicine

## 2021-04-27 ENCOUNTER — Encounter (HOSPITAL_COMMUNITY): Payer: Self-pay

## 2021-04-27 ENCOUNTER — Emergency Department (HOSPITAL_COMMUNITY): Payer: Medicare Other

## 2021-04-27 DIAGNOSIS — I10 Essential (primary) hypertension: Secondary | ICD-10-CM | POA: Diagnosis not present

## 2021-04-27 DIAGNOSIS — Z7982 Long term (current) use of aspirin: Secondary | ICD-10-CM

## 2021-04-27 DIAGNOSIS — K219 Gastro-esophageal reflux disease without esophagitis: Secondary | ICD-10-CM | POA: Diagnosis present

## 2021-04-27 DIAGNOSIS — R531 Weakness: Secondary | ICD-10-CM | POA: Diagnosis not present

## 2021-04-27 DIAGNOSIS — E7800 Pure hypercholesterolemia, unspecified: Secondary | ICD-10-CM | POA: Diagnosis present

## 2021-04-27 DIAGNOSIS — E43 Unspecified severe protein-calorie malnutrition: Secondary | ICD-10-CM | POA: Diagnosis not present

## 2021-04-27 DIAGNOSIS — Z20822 Contact with and (suspected) exposure to covid-19: Secondary | ICD-10-CM | POA: Diagnosis not present

## 2021-04-27 DIAGNOSIS — I700 Atherosclerosis of aorta: Secondary | ICD-10-CM | POA: Diagnosis present

## 2021-04-27 DIAGNOSIS — W19XXXA Unspecified fall, initial encounter: Secondary | ICD-10-CM | POA: Diagnosis not present

## 2021-04-27 DIAGNOSIS — I2510 Atherosclerotic heart disease of native coronary artery without angina pectoris: Secondary | ICD-10-CM | POA: Diagnosis present

## 2021-04-27 DIAGNOSIS — R0902 Hypoxemia: Secondary | ICD-10-CM | POA: Diagnosis not present

## 2021-04-27 DIAGNOSIS — E039 Hypothyroidism, unspecified: Secondary | ICD-10-CM | POA: Diagnosis present

## 2021-04-27 DIAGNOSIS — R29898 Other symptoms and signs involving the musculoskeletal system: Secondary | ICD-10-CM | POA: Diagnosis not present

## 2021-04-27 DIAGNOSIS — I63511 Cerebral infarction due to unspecified occlusion or stenosis of right middle cerebral artery: Secondary | ICD-10-CM

## 2021-04-27 DIAGNOSIS — Z66 Do not resuscitate: Secondary | ICD-10-CM | POA: Diagnosis present

## 2021-04-27 DIAGNOSIS — I6521 Occlusion and stenosis of right carotid artery: Secondary | ICD-10-CM | POA: Diagnosis not present

## 2021-04-27 DIAGNOSIS — F0390 Unspecified dementia without behavioral disturbance: Secondary | ICD-10-CM | POA: Diagnosis present

## 2021-04-27 DIAGNOSIS — M1990 Unspecified osteoarthritis, unspecified site: Secondary | ICD-10-CM | POA: Diagnosis present

## 2021-04-27 DIAGNOSIS — I252 Old myocardial infarction: Secondary | ICD-10-CM

## 2021-04-27 DIAGNOSIS — Z923 Personal history of irradiation: Secondary | ICD-10-CM

## 2021-04-27 DIAGNOSIS — I951 Orthostatic hypotension: Secondary | ICD-10-CM | POA: Diagnosis present

## 2021-04-27 DIAGNOSIS — Z8673 Personal history of transient ischemic attack (TIA), and cerebral infarction without residual deficits: Secondary | ICD-10-CM

## 2021-04-27 DIAGNOSIS — Z85818 Personal history of malignant neoplasm of other sites of lip, oral cavity, and pharynx: Secondary | ICD-10-CM

## 2021-04-27 DIAGNOSIS — Z888 Allergy status to other drugs, medicaments and biological substances status: Secondary | ICD-10-CM

## 2021-04-27 DIAGNOSIS — Z8249 Family history of ischemic heart disease and other diseases of the circulatory system: Secondary | ICD-10-CM

## 2021-04-27 DIAGNOSIS — R109 Unspecified abdominal pain: Secondary | ICD-10-CM

## 2021-04-27 DIAGNOSIS — D72829 Elevated white blood cell count, unspecified: Secondary | ICD-10-CM

## 2021-04-27 DIAGNOSIS — Z803 Family history of malignant neoplasm of breast: Secondary | ICD-10-CM

## 2021-04-27 DIAGNOSIS — I350 Nonrheumatic aortic (valve) stenosis: Secondary | ICD-10-CM | POA: Diagnosis present

## 2021-04-27 DIAGNOSIS — Z79890 Hormone replacement therapy (postmenopausal): Secondary | ICD-10-CM

## 2021-04-27 DIAGNOSIS — R1310 Dysphagia, unspecified: Secondary | ICD-10-CM

## 2021-04-27 DIAGNOSIS — I639 Cerebral infarction, unspecified: Secondary | ICD-10-CM | POA: Diagnosis not present

## 2021-04-27 DIAGNOSIS — G6289 Other specified polyneuropathies: Secondary | ICD-10-CM | POA: Diagnosis present

## 2021-04-27 DIAGNOSIS — I701 Atherosclerosis of renal artery: Secondary | ICD-10-CM | POA: Diagnosis present

## 2021-04-27 DIAGNOSIS — R1319 Other dysphagia: Secondary | ICD-10-CM

## 2021-04-27 DIAGNOSIS — D472 Monoclonal gammopathy: Secondary | ICD-10-CM | POA: Diagnosis present

## 2021-04-27 DIAGNOSIS — Z87891 Personal history of nicotine dependence: Secondary | ICD-10-CM

## 2021-04-27 DIAGNOSIS — Z974 Presence of external hearing-aid: Secondary | ICD-10-CM

## 2021-04-27 DIAGNOSIS — G629 Polyneuropathy, unspecified: Secondary | ICD-10-CM | POA: Diagnosis present

## 2021-04-27 DIAGNOSIS — E890 Postprocedural hypothyroidism: Secondary | ICD-10-CM | POA: Diagnosis present

## 2021-04-27 DIAGNOSIS — Z79899 Other long term (current) drug therapy: Secondary | ICD-10-CM

## 2021-04-27 DIAGNOSIS — R4701 Aphasia: Secondary | ICD-10-CM | POA: Diagnosis not present

## 2021-04-27 DIAGNOSIS — N179 Acute kidney failure, unspecified: Secondary | ICD-10-CM | POA: Diagnosis present

## 2021-04-27 DIAGNOSIS — G8929 Other chronic pain: Secondary | ICD-10-CM

## 2021-04-27 DIAGNOSIS — Z951 Presence of aortocoronary bypass graft: Secondary | ICD-10-CM

## 2021-04-27 DIAGNOSIS — N400 Enlarged prostate without lower urinary tract symptoms: Secondary | ICD-10-CM | POA: Diagnosis present

## 2021-04-27 LAB — DIFFERENTIAL
Abs Immature Granulocytes: 0.03 10*3/uL (ref 0.00–0.07)
Basophils Absolute: 0.1 10*3/uL (ref 0.0–0.1)
Basophils Relative: 1 %
Eosinophils Absolute: 0.1 10*3/uL (ref 0.0–0.5)
Eosinophils Relative: 1 %
Immature Granulocytes: 0 %
Lymphocytes Relative: 11 %
Lymphs Abs: 1 10*3/uL (ref 0.7–4.0)
Monocytes Absolute: 0.9 10*3/uL (ref 0.1–1.0)
Monocytes Relative: 9 %
Neutro Abs: 7.2 10*3/uL (ref 1.7–7.7)
Neutrophils Relative %: 78 %

## 2021-04-27 LAB — ETHANOL: Alcohol, Ethyl (B): <10 mg/dL (ref <10)

## 2021-04-27 LAB — CBC
HCT: 45.9 % (ref 39.0–52.0)
HCT: 43.3 % (ref 39.0–52.0)
Hemoglobin: 14.7 g/dL (ref 13.0–17.0)
Hemoglobin: 14.3 g/dL (ref 13.0–17.0)
MCH: 31.7 pg (ref 26.0–34.0)
MCH: 32.3 pg (ref 26.0–34.0)
MCHC: 32 g/dL (ref 30.0–36.0)
MCHC: 33 g/dL (ref 30.0–36.0)
MCV: 98.9 fL (ref 80.0–100.0)
MCV: 97.7 fL (ref 80.0–100.0)
Platelets: 143 10*3/uL — ABNORMAL LOW (ref 150–400)
Platelets: 149 10*3/uL — ABNORMAL LOW (ref 150–400)
RBC: 4.64 MIL/uL (ref 4.22–5.81)
RBC: 4.43 MIL/uL (ref 4.22–5.81)
RDW: 14.7 % (ref 11.5–15.5)
RDW: 15 % (ref 11.5–15.5)
WBC: 9.1 10*3/uL (ref 4.0–10.5)
WBC: 17.7 10*3/uL — ABNORMAL HIGH (ref 4.0–10.5)
nRBC: 0 % (ref 0.0–0.2)
nRBC: 0 % (ref 0.0–0.2)

## 2021-04-27 LAB — COMPREHENSIVE METABOLIC PANEL
ALT: 21 U/L (ref 0–44)
AST: 40 U/L (ref 15–41)
Albumin: 4.4 g/dL (ref 3.5–5.0)
Alkaline Phosphatase: 40 U/L (ref 38–126)
Anion gap: 7 (ref 5–15)
BUN: 19 mg/dL (ref 8–23)
CO2: 29 mmol/L (ref 22–32)
Calcium: 9.3 mg/dL (ref 8.9–10.3)
Chloride: 102 mmol/L (ref 98–111)
Creatinine, Ser: 1.52 mg/dL — ABNORMAL HIGH (ref 0.61–1.24)
GFR, Estimated: 46 mL/min — ABNORMAL LOW (ref >60)
Glucose, Bld: 124 mg/dL — ABNORMAL HIGH (ref 70–99)
Potassium: 4 mmol/L (ref 3.5–5.1)
Sodium: 138 mmol/L (ref 135–145)
Total Bilirubin: 0.5 mg/dL (ref 0.3–1.2)
Total Protein: 8.7 g/dL — ABNORMAL HIGH (ref 6.5–8.1)

## 2021-04-27 LAB — RAPID URINE DRUG SCREEN, HOSP PERFORMED
Amphetamines: NOT DETECTED
Barbiturates: NOT DETECTED
Benzodiazepines: POSITIVE — AB
Cocaine: NOT DETECTED
Opiates: NOT DETECTED
Tetrahydrocannabinol: NOT DETECTED

## 2021-04-27 LAB — URINALYSIS, ROUTINE W REFLEX MICROSCOPIC
Bilirubin Urine: NEGATIVE
Glucose, UA: NEGATIVE mg/dL
Ketones, ur: NEGATIVE mg/dL
Leukocytes,Ua: NEGATIVE
Nitrite: NEGATIVE
Protein, ur: 100 mg/dL — AB
Specific Gravity, Urine: 1.021 (ref 1.005–1.030)
pH: 7 (ref 5.0–8.0)

## 2021-04-27 LAB — CREATININE, SERUM
Creatinine, Ser: 1.13 mg/dL (ref 0.61–1.24)
GFR, Estimated: >60 mL/min (ref >60)

## 2021-04-27 LAB — PROTIME-INR
INR: 1 (ref 0.8–1.2)
Prothrombin Time: 13.2 seconds (ref 11.4–15.2)

## 2021-04-27 LAB — RESP PANEL BY RT-PCR (FLU A&B, COVID) ARPGX2
Influenza A by PCR: NEGATIVE
Influenza B by PCR: NEGATIVE
SARS Coronavirus 2 by RT PCR: NEGATIVE

## 2021-04-27 LAB — APTT: aPTT: 25 seconds (ref 24–36)

## 2021-04-27 LAB — CBG MONITORING, ED: Glucose-Capillary: 117 mg/dL — ABNORMAL HIGH (ref 70–99)

## 2021-04-27 MED ORDER — HYDRALAZINE HCL 20 MG/ML IJ SOLN
2.0000 mg | Freq: Four times a day (QID) | INTRAMUSCULAR | Status: DC | PRN
  Administered 2021-04-28: 2 mg via INTRAVENOUS
  Filled 2021-04-27: qty 1

## 2021-04-27 MED ORDER — SENNOSIDES-DOCUSATE SODIUM 8.6-50 MG PO TABS: 1.0000 | ORAL_TABLET | Freq: Every evening | ORAL | Status: DC | PRN

## 2021-04-27 MED ORDER — LORAZEPAM 2 MG/ML IJ SOLN
INTRAMUSCULAR | Status: AC
  Filled 2021-04-27: qty 1

## 2021-04-27 MED ORDER — ASPIRIN 300 MG RE SUPP: 300.0000 mg | Freq: Every day | RECTAL | Status: AC

## 2021-04-27 MED ORDER — PILOCARPINE HCL 5 MG PO TABS
7.5000 mg | ORAL_TABLET | Freq: Three times a day (TID) | ORAL | Status: DC
  Administered 2021-04-28 – 2021-04-30 (×5): 7.5 mg via ORAL
  Filled 2021-04-27 (×11): qty 2

## 2021-04-27 MED ORDER — ASPIRIN 325 MG PO TABS
325.0000 mg | ORAL_TABLET | Freq: Every day | ORAL | Status: AC
  Administered 2021-04-27: 325 mg via ORAL
  Filled 2021-04-27: qty 1

## 2021-04-27 MED ORDER — ACETAMINOPHEN 325 MG PO TABS: 650.0000 mg | ORAL_TABLET | ORAL | Status: DC | PRN

## 2021-04-27 MED ORDER — SODIUM CHLORIDE 0.9 % IV BOLUS
500.0000 mL | Freq: Once | INTRAVENOUS | Status: AC
  Administered 2021-04-27: 500 mL via INTRAVENOUS

## 2021-04-27 MED ORDER — LEVOTHYROXINE SODIUM 100 MCG PO TABS
100.0000 ug | ORAL_TABLET | Freq: Every day | ORAL | Status: DC
  Administered 2021-04-28 – 2021-04-30 (×3): 100 ug via ORAL
  Filled 2021-04-27: qty 2
  Filled 2021-04-27 (×2): qty 1

## 2021-04-27 MED ORDER — HEPARIN SODIUM (PORCINE) 5000 UNIT/ML IJ SOLN
5000.0000 [IU] | Freq: Three times a day (TID) | INTRAMUSCULAR | Status: DC
  Administered 2021-04-27 – 2021-04-30 (×8): 5000 [IU] via SUBCUTANEOUS
  Filled 2021-04-27 (×8): qty 1

## 2021-04-27 MED ORDER — GABAPENTIN 300 MG PO CAPS
900.0000 mg | ORAL_CAPSULE | Freq: Three times a day (TID) | ORAL | Status: DC
  Administered 2021-04-27 – 2021-04-30 (×8): 900 mg via ORAL
  Filled 2021-04-27 (×8): qty 3

## 2021-04-27 MED ORDER — TICAGRELOR 90 MG PO TABS
90.0000 mg | ORAL_TABLET | Freq: Two times a day (BID) | ORAL | Status: DC
  Administered 2021-04-27 – 2021-04-30 (×7): 90 mg via ORAL
  Filled 2021-04-27 (×7): qty 1

## 2021-04-27 MED ORDER — TRAMADOL HCL 50 MG PO TABS
50.0000 mg | ORAL_TABLET | Freq: Two times a day (BID) | ORAL | Status: DC | PRN
  Administered 2021-04-27 – 2021-04-29 (×2): 50 mg via ORAL
  Filled 2021-04-27 (×2): qty 1

## 2021-04-27 MED ORDER — LORAZEPAM 1 MG PO TABS
1.0000 mg | ORAL_TABLET | Freq: Three times a day (TID) | ORAL | Status: DC | PRN
  Administered 2021-04-27: 1 mg via ORAL
  Filled 2021-04-27: qty 1

## 2021-04-27 MED ORDER — STROKE: EARLY STAGES OF RECOVERY BOOK
Freq: Once | Status: DC
Start: 2021-04-27 — End: 2021-04-30
  Filled 2021-04-27: qty 1

## 2021-04-27 MED ORDER — HYDRALAZINE HCL 20 MG/ML IJ SOLN
5.0000 mg | Freq: Four times a day (QID) | INTRAMUSCULAR | Status: DC | PRN
  Administered 2021-04-27: 5 mg via INTRAVENOUS
  Filled 2021-04-27: qty 1

## 2021-04-27 MED ORDER — ASPIRIN 81 MG PO CHEW
81.0000 mg | CHEWABLE_TABLET | Freq: Every day | ORAL | Status: DC
  Administered 2021-04-28 – 2021-04-30 (×3): 81 mg via ORAL
  Filled 2021-04-27 (×3): qty 1

## 2021-04-27 MED ORDER — ROSUVASTATIN CALCIUM 20 MG PO TABS
20.0000 mg | ORAL_TABLET | Freq: Every day | ORAL | Status: DC
  Administered 2021-04-27 – 2021-04-30 (×4): 20 mg via ORAL
  Filled 2021-04-27 (×4): qty 1

## 2021-04-27 MED ORDER — ACETAMINOPHEN 160 MG/5ML PO SOLN: 650.0000 mg | ORAL | Status: DC | PRN

## 2021-04-27 MED ORDER — LORAZEPAM 2 MG/ML IJ SOLN
1.0000 mg | Freq: Four times a day (QID) | INTRAMUSCULAR | Status: DC | PRN
  Administered 2021-04-27 (×2): 1 mg via INTRAVENOUS
  Filled 2021-04-27: qty 1

## 2021-04-27 MED ORDER — LACTATED RINGERS IV SOLN: INTRAVENOUS | Status: DC

## 2021-04-27 MED ORDER — QUETIAPINE FUMARATE 50 MG PO TABS
50.0000 mg | ORAL_TABLET | Freq: Every day | ORAL | Status: DC
  Administered 2021-04-27 – 2021-04-29 (×3): 50 mg via ORAL
  Filled 2021-04-27 (×2): qty 1
  Filled 2021-04-27: qty 2

## 2021-04-27 MED ORDER — DONEPEZIL HCL 10 MG PO TABS
5.0000 mg | ORAL_TABLET | Freq: Every day | ORAL | Status: DC
  Administered 2021-04-27 – 2021-04-29 (×3): 5 mg via ORAL
  Filled 2021-04-27 (×3): qty 1

## 2021-04-27 MED ORDER — FLUDROCORTISONE ACETATE 0.1 MG PO TABS
100.0000 ug | ORAL_TABLET | Freq: Every day | ORAL | Status: DC
  Administered 2021-04-27 – 2021-04-30 (×4): 100 ug via ORAL
  Filled 2021-04-27 (×6): qty 1

## 2021-04-27 MED ORDER — IOHEXOL 350 MG/ML SOLN
75.0000 mL | Freq: Once | INTRAVENOUS | Status: AC | PRN
  Administered 2021-04-27: 75 mL via INTRAVENOUS

## 2021-04-27 MED ORDER — ACETAMINOPHEN 650 MG RE SUPP: 650.0000 mg | RECTAL | Status: DC | PRN

## 2021-04-27 NOTE — ED Notes (Signed)
Pt given ice chips per request

## 2021-04-27 NOTE — ED Notes (Signed)
Patient becoming lethargic and hypotensive after Ativan and hydralazine. Dr Roderic Palau made aware.

## 2021-04-27 NOTE — Progress Notes (Addendum)
TR[facility] night shift telemetry coverage note.  The patient told earlier to the nursing staff that he was going to go home if he did not have something to eat.  SLP evaluated him earlier and only recommended ice chips, sips of water or applesauce with meds.  I told the results of this evaluation to the patient and explained to him that the reason for this restriction is to avoid the possibility of aspiration into the airways with development of aspiration pneumonia.  He voiced understanding and will stayed in the hospital.  Doyne Keel  Addendum [phone]: The nursing staff communicated to [place] that the patient's blood pressure is going up despite being administered hydralazine 2 mg IVP and he became tachycardic after hydralazine given. His most recent measurement was 215/114 mmHg with a pulse of 117.  Hydralazine 2 mg IVP every 6 as needed for SBP greater than 200 mmHg has been discontinued.  Metoprolol 2.5 mg IVP every 6 hours as needed ordered for  previous SBP parameters.  Tennis Must, MD.

## 2021-04-27 NOTE — ED Notes (Signed)
PT assisted into hospital bed

## 2021-04-27 NOTE — ED Notes (Signed)
Paged consult for neuro f'up page. George Bradley

## 2021-04-27 NOTE — ED Notes (Signed)
Dr Olevia Bowens has spoken with pt regarding diet status and recommendations from speech therapist only to have ice chips, sips of water with meds and applesauce. Informed pt that he would not get a bed a [facility] tonight, but I have requested a hospital bed be brought down to help with comfort while he stays here tonight. Pt also asking for tramadol, which Dr Olevia Bowens is going to order for him. Pt was agreeable to this plan.
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
## 2021-04-27 NOTE — ED Notes (Signed)
Dr Sabra Heck aware of patient's blood pressures.

## 2021-04-27 NOTE — ED Notes (Signed)
All MEDS GIVEN WITH APPLESAUCE

## 2021-04-27 NOTE — ED Notes (Signed)
Pt trying to get out of bed- this nurse walks in room and asks pt where he is going, pt says he wants to leave b/c we won't feed him. Informed pt that he failed his swallow screen and was evaluated by speech therapist earlier and cannot have anything but ice chips and apple sauce when swallowing his medications. Pt says he does not remember this. Informed pt that I will page Dr Olevia Bowens and have him come and talk with him. Pt has agreed to this. Pt given ice chips at this time. Tolerating well.

## 2021-04-27 NOTE — ED Notes (Signed)
Patient came in via ems, covered in liquid stool. Patient cleaned and pericare done.
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
## 2021-04-27 NOTE — ED Notes (Signed)
Patient insistent on ambulating, tried to ambulate patient with 2 assists and walker but patient unable to tolerate beyond standing. Patient repositioned in bed.

## 2021-04-27 NOTE — ED Notes (Signed)
Message sent to Dr Roderic Palau about patient's HR of 117-120.

## 2021-04-27 NOTE — ED Triage Notes (Signed)
Pt presents to ED with complaints of increased left sided weakness. LKW 1800 yesterday. Pt with history of stroke and left sided weakness deficit. Pt incontinent of bowel and bladder. Pt states he wouldn't walk this morning, fell to ground but was able to get back into bed. Both legs were weak and difficult to get back into bed.

## 2021-04-27 NOTE — ED Notes (Signed)
Patient now Alert and Oriented, instructed by Dr Sabra Heck to perform swallowing study on patient now.

## 2021-04-27 NOTE — ED Notes (Signed)
Patient given oral medication, crushed with applesauce with speech therapist present in room, patient tolerated well.

## 2021-04-27 NOTE — ED Notes (Signed)
Pt anxious, restless in bed, says he is unable to sleep. Will give PRN meds

## 2021-04-27 NOTE — ED Notes (Signed)
Helped NT and Rn clean bm from pt changed pad,sheet, draw sheet and placed pt in a clean gown

## 2021-04-28 ENCOUNTER — Observation Stay (HOSPITAL_COMMUNITY): Payer: Medicare Other

## 2021-04-28 ENCOUNTER — Observation Stay (HOSPITAL_BASED_OUTPATIENT_CLINIC_OR_DEPARTMENT_OTHER): Payer: Medicare Other

## 2021-04-28 ENCOUNTER — Other Ambulatory Visit (HOSPITAL_COMMUNITY): Payer: Self-pay | Admitting: *Deleted

## 2021-04-28 DIAGNOSIS — I701 Atherosclerosis of renal artery: Secondary | ICD-10-CM | POA: Diagnosis present

## 2021-04-28 DIAGNOSIS — Z66 Do not resuscitate: Secondary | ICD-10-CM | POA: Diagnosis present

## 2021-04-28 DIAGNOSIS — I951 Orthostatic hypotension: Secondary | ICD-10-CM | POA: Diagnosis present

## 2021-04-28 DIAGNOSIS — I6521 Occlusion and stenosis of right carotid artery: Secondary | ICD-10-CM | POA: Diagnosis present

## 2021-04-28 DIAGNOSIS — I350 Nonrheumatic aortic (valve) stenosis: Secondary | ICD-10-CM | POA: Diagnosis present

## 2021-04-28 DIAGNOSIS — Z803 Family history of malignant neoplasm of breast: Secondary | ICD-10-CM | POA: Diagnosis not present

## 2021-04-28 DIAGNOSIS — I700 Atherosclerosis of aorta: Secondary | ICD-10-CM | POA: Diagnosis present

## 2021-04-28 DIAGNOSIS — N400 Enlarged prostate without lower urinary tract symptoms: Secondary | ICD-10-CM | POA: Diagnosis present

## 2021-04-28 DIAGNOSIS — D472 Monoclonal gammopathy: Secondary | ICD-10-CM | POA: Diagnosis present

## 2021-04-28 DIAGNOSIS — D72829 Elevated white blood cell count, unspecified: Secondary | ICD-10-CM | POA: Diagnosis not present

## 2021-04-28 DIAGNOSIS — Z8249 Family history of ischemic heart disease and other diseases of the circulatory system: Secondary | ICD-10-CM | POA: Diagnosis not present

## 2021-04-28 DIAGNOSIS — G629 Polyneuropathy, unspecified: Secondary | ICD-10-CM | POA: Diagnosis present

## 2021-04-28 DIAGNOSIS — R4701 Aphasia: Secondary | ICD-10-CM | POA: Diagnosis present

## 2021-04-28 DIAGNOSIS — I252 Old myocardial infarction: Secondary | ICD-10-CM | POA: Diagnosis not present

## 2021-04-28 DIAGNOSIS — E43 Unspecified severe protein-calorie malnutrition: Secondary | ICD-10-CM | POA: Diagnosis present

## 2021-04-28 DIAGNOSIS — I639 Cerebral infarction, unspecified: Secondary | ICD-10-CM | POA: Diagnosis not present

## 2021-04-28 DIAGNOSIS — F0390 Unspecified dementia without behavioral disturbance: Secondary | ICD-10-CM | POA: Diagnosis present

## 2021-04-28 DIAGNOSIS — I6389 Other cerebral infarction: Secondary | ICD-10-CM

## 2021-04-28 DIAGNOSIS — I2510 Atherosclerotic heart disease of native coronary artery without angina pectoris: Secondary | ICD-10-CM | POA: Diagnosis present

## 2021-04-28 DIAGNOSIS — N179 Acute kidney failure, unspecified: Secondary | ICD-10-CM | POA: Diagnosis present

## 2021-04-28 DIAGNOSIS — R1310 Dysphagia, unspecified: Secondary | ICD-10-CM | POA: Diagnosis present

## 2021-04-28 DIAGNOSIS — Z7982 Long term (current) use of aspirin: Secondary | ICD-10-CM | POA: Diagnosis not present

## 2021-04-28 DIAGNOSIS — E7800 Pure hypercholesterolemia, unspecified: Secondary | ICD-10-CM | POA: Diagnosis present

## 2021-04-28 DIAGNOSIS — R109 Unspecified abdominal pain: Secondary | ICD-10-CM | POA: Diagnosis not present

## 2021-04-28 DIAGNOSIS — I63231 Cerebral infarction due to unspecified occlusion or stenosis of right carotid arteries: Secondary | ICD-10-CM | POA: Diagnosis not present

## 2021-04-28 DIAGNOSIS — R531 Weakness: Secondary | ICD-10-CM | POA: Diagnosis not present

## 2021-04-28 DIAGNOSIS — I10 Essential (primary) hypertension: Secondary | ICD-10-CM | POA: Diagnosis present

## 2021-04-28 DIAGNOSIS — Z20822 Contact with and (suspected) exposure to covid-19: Secondary | ICD-10-CM | POA: Diagnosis present

## 2021-04-28 DIAGNOSIS — Z85818 Personal history of malignant neoplasm of other sites of lip, oral cavity, and pharynx: Secondary | ICD-10-CM | POA: Diagnosis not present

## 2021-04-28 DIAGNOSIS — E890 Postprocedural hypothyroidism: Secondary | ICD-10-CM | POA: Diagnosis present

## 2021-04-28 DIAGNOSIS — E039 Hypothyroidism, unspecified: Secondary | ICD-10-CM | POA: Diagnosis not present

## 2021-04-28 LAB — LIPID PANEL
Cholesterol: 74 mg/dL (ref 0–200)
HDL: 45 mg/dL (ref >40)
LDL Cholesterol: 20 mg/dL (ref 0–99)
Total CHOL/HDL Ratio: 1.6 RATIO
Triglycerides: 47 mg/dL (ref <150)
VLDL: 9 mg/dL (ref 0–40)

## 2021-04-28 LAB — ECHOCARDIOGRAM COMPLETE
AR max vel: 1.1 cm2
AV Area VTI: 1.07 cm2
AV Area mean vel: 1.04 cm2
AV Mean grad: 21 mmHg
AV Peak grad: 34.8 mmHg
Ao pk vel: 2.95 m/s
Area-P 1/2: 2.1 cm2
Height: 73 in
S' Lateral: 3.45 cm
Weight: 2800 oz

## 2021-04-28 LAB — HEMOGLOBIN A1C
Hgb A1c MFr Bld: 6 % — ABNORMAL HIGH (ref 4.8–5.6)
Mean Plasma Glucose: 125.5 mg/dL

## 2021-04-28 MED ORDER — METOPROLOL TARTRATE 5 MG/5ML IV SOLN
2.5000 mg | Freq: Four times a day (QID) | INTRAVENOUS | Status: DC | PRN
  Administered 2021-04-28: 2.5 mg via INTRAVENOUS
  Filled 2021-04-28: qty 5

## 2021-04-28 NOTE — Progress Notes (Signed)
Contacted dr. to order KUB to clear pt. via imaging for MRI exam. Need this due to pt. not being alert and orientated to answer safety questions, and no immediate family available to clear on pt. Behalf.

## 2021-04-28 NOTE — Evaluation (Signed)
Physical Therapy Evaluation Patient Details Name: George Bradley MRN: [id number] DOB: October 08, 1940 Today's Date: 04/28/2021   History of Present Illness  George Bradley is a 80 y.o. 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  He was also noted to have right carotid artery occlusion at that time and underwent stent placement.  He was started on dual antiplatelet therapy with aspirin and Brilinta.  He also has history of orthostatic hypotension with wide fluxes and blood pressure.  He frequently becomes very hypertensive subsequently followed by extreme hypotension.  He also has a history of coronary artery disease, hypothyroidism, hyperlipidemia, polyneuropathy.  After the patient's last stroke in 11/2020, he has been doing fairly well.  He had a follow-up with interventional radiology on 03/28/2021 where follow-up carotid Dopplers showed that right carotid was patent with 1 to 39% stenosis.  At that time, Brilinta was discontinued and he was continued on aspirin alone.  He reports he was in his usual state of health when he went to bed yesterday.  When he woke up this morning, he got up to go to the bathroom and felt that his legs are weak, causing him to fall to the floor.  He was able to crawl back into bed eventually.  This occurred at approximately 8 AM.  When his daughter arrived EMS was called and it was noted that he was having some slurring of his speech and left upper extremity weakness.  He was brought to the ER for evaluation.  He denies any chest pain, shortness of breath, fever, dysuria.  He does report passing his urine and moving his bowels while laying on the floor since he was unable to make it to the bathroom.  Does not appear that he lost consciousness.  He denies any numbness, changes in vision.   Clinical Impression   Patient demonstrates some generalized LE weakness, albeit without significant LLE deficits manifest, demonstrating  adequate gross motor coordination/control with more proximal LLE weakness manifest requiring reliance on RW for transfers, ambulation, balance.  Patient would benefit from continued PT services on an outpatient basis to improve strength, balance, and activity tolerance to reduce risk for falls. Able to perform bed mobility at an independent level; transfers with CGA due to unsteadiness, and ambulation with CGA on level surfaces using RW demonstrating wide BOS and unsteadiness during turns. Patient discharged to care of nursing for ambulation daily as tolerated for length of stay.     Follow Up Recommendations Outpatient PT    Equipment Recommendations  Rolling walker with 5" wheels    Recommendations for Other Services       Precautions / Restrictions Precautions Precautions: Fall Restrictions Weight Bearing Restrictions: No      Mobility  Bed Mobility Overal bed mobility: Modified Independent                  Transfers Overall transfer level: Needs assistance Equipment used: None Transfers: Sit to/from Stand;Stand Pivot Transfers Sit to Stand: Min guard Stand pivot transfers: Min guard          Ambulation/Gait Ambulation/Gait assistance: Min guard Gait Distance (Feet): 50 Feet Assistive device: Rolling walker (2 wheeled) Gait Pattern/deviations: Trunk flexed;Wide base of support (head down) Gait velocity: decreased      Stairs            Wheelchair Mobility    Modified Rankin (Stroke Patients Only)       Balance Overall balance assessment: Needs  assistance Sitting-balance support: No upper extremity supported       Standing balance support: Single extremity supported Standing balance-Leahy Scale: Fair Standing balance comment: wide BOS                             Pertinent Vitals/Pain Pain Assessment: No/denies pain    Home Living Family/patient expects to be discharged to:: Private residence Living Arrangements:  Spouse/significant other Available Help at Discharge: Family;Available 24 hours/day Type of Home: House Home Access: Stairs to enter Entrance Stairs-Rails: Right Entrance Stairs-Number of Steps: 3 thru the garage Home Layout: Two level;Able to live on main level with bedroom/bathroom Home Equipment: Shower seat - built in;Hand held Tourist information centre manager - 2 wheels;Cane - single point;Cane - quad;Wheelchair - manual;Walker - 4 wheels;Grab bars - toilet;Grab bars - tub/shower Additional Comments: Wife is a retired acute PT    Prior Function Level of Independence: Independent         Comments: Performing ADLs, light IADLs- riding lawn mower, and driving. (Reports spouse is trying to convince him not to drive.)     Hand Dominance   Dominant Hand: Right    Extremity/Trunk Assessment   Upper Extremity Assessment Upper Extremity Assessment: Defer to OT evaluation    Lower Extremity Assessment Lower Extremity Assessment: Generalized weakness       Communication   Communication: No difficulties  Cognition Arousal/Alertness: Awake/alert Behavior During Therapy: WFL for tasks assessed/performed Overall Cognitive Status: Within Functional Limits for tasks assessed                                        General Comments      Exercises     Assessment/Plan    PT Assessment All further PT needs can be met in the next venue of care  PT Problem List Decreased strength;Decreased balance;Decreased activity tolerance       PT Treatment Interventions      PT Goals (Current goals can be found in the Care Plan section)  Acute Rehab PT Goals Patient Stated Goal: Return home PT Goal Formulation: With patient Time For Goal Achievement: 05/03/21 Potential to Achieve Goals: Good    Frequency     Barriers to discharge        Co-evaluation               AM-PAC PT "6 Clicks" Mobility  Outcome Measure Help needed turning from your back to your side while in  a flat bed without using bedrails?: None Help needed moving from lying on your back to sitting on the side of a flat bed without using bedrails?: None Help needed moving to and from a bed to a chair (including a wheelchair)?: A Little Help needed standing up from a chair using your arms (e.g., wheelchair or bedside chair)?: A Little Help needed to walk in hospital room?: A Little Help needed climbing 3-5 steps with a railing? : A Little 6 Click Score: 20    End of Session Equipment Utilized During Treatment: Gait belt Activity Tolerance: Patient tolerated treatment well Patient left: in bed;with call bell/phone within reach Nurse Communication: Mobility status PT Visit Diagnosis: Unsteadiness on feet (R26.81);Muscle weakness (generalized) (M62.81);History of falling (Z91.81)    Time: 1010-1040 PT Time Calculation (min) (ACUTE ONLY): 30 min   Charges:   PT Evaluation $PT Eval Low Complexity: 1 Low  PT Treatments $Gait Training: 8-22 mins $Therapeutic Activity: 8-22 mins       11:03 AM, 04/28/21 M. Sherlyn Lees, PT, DPT Physical Therapist- [place] Office Number: [phone]

## 2021-04-28 NOTE — Progress Notes (Signed)
PROGRESS NOTE    George Bradley  [id number] DOB: [birth year] DOA: 04/27/2021 PCP: [name], MD    Brief Narrative:  80 year old male with a history of prior right MCA CVA in 12/2020 status post thrombectomy, right carotid artery occlusion status post stent placement in 12/2020.  Patient has been taking aspirin and Brilinta after stent placement.  Family reports that he had recovered from neurodeficits from stroke and was doing fairly well.  Repeat carotid ultrasound 03/28/2021 showed patent right carotid artery.  Brilinta was discontinued at that time and is continued on aspirin.  He presents with transient slurred speech, left upper extremity weakness.  CT angiogram of head and neck did not show any acute stroke, but did comment on occluded right carotid artery.  He is being transferred to [facility] for further stroke work-up including MRI brain.  He will also be evaluated by neuro interventional radiology for angiogram to evaluate right carotid.   Assessment & Plan:   Active Problems:   Hypercholesterolemia   Orthostatic hypotension   Hypothyroidism   Dysphagia   MGUS (monoclonal gammopathy of unknown significance)   Small fiber polyneuropathy   Dementia (HCC)   Left-sided weakness   Right carotid artery occlusion   Acute left upper extremity weakness, transient slurred speech -Concerned that patient has had another CVA -CT angiogram of the head and neck have been performed -MRI brain pending -Echocardiogram pending -LDL 20, on statin -A1c in process -Seen by PT with recommendations for outpatient PT -OT evaluation pending -Since he failed bedside swallow screen, seen by speech therapy and is currently n.p.o. until they can reevaluate him -Continue on aspirin and Brilinta -Patient will transfer to [facility] for further work-up, neurology will consult, and neuro interventional radiology evaluation   Right carotid occlusion -Patient had carotid Dopplers 1 month ago that  showed patent right carotid artery, 1 to 39% stenosis.  Brilinta was discontinued at that time and he was continued on aspirin alone -Current CTA of the neck shows occluded right carotid artery -Case was discussed with neuro interventional radiology, Dr. Estanislado Pandy -Since there is no evidence of bleeding on CT head, he is recommended to restart the patient on Brilinta in addition to aspirin -He will evaluate the patient once he arrives to [facility] with plans for angiogram on Sunday or Monday with intention to treat, if patient is stable    Hypertension/orthostatic hypotension -Patient and wife report that his blood pressure is usually very erratic, having extreme highs and lows -He is on very low-dose amlodipine on an as-needed basis as an outpatient -We will allow permissive hypertension for now -We will provide IV metoprolol as needed for systolic blood pressures greater than 200 -He is also chronically on fludrocortisone   Hypothyroidism -Resume on levothyroxine once able to take p.o.   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 and being followed by  speech therapy since he failed bedside swallow screen -He can take his medications crushed in applesauce   DVT prophylaxis: heparin injection 5,000 Units Start: 04/27/21  2200  Code Status: DNR Family Communication: Left voicemail for patient's wife Disposition Plan: Status is: Observation  The patient remains OBS appropriate and will d/c before 2 midnights.  Dispo: The patient is from: Home              Anticipated d/c is to: Home              Patient currently is not medically stable to d/c.   Difficult to place patient No         Consultants:  Neurology Neuro interventional radiology, Dr. Estanislado Pandy  Procedures:    Antimicrobials:      Subjective: Reports that he was having some cramps in his legs last night which appear to be better at this point.  He is frustrated that he still in the emergency room.  He was also frustrated that he has been kept n.p.o. due to swallowing issues.  Objective: Vitals:   04/28/21 0600 04/28/21 0630 04/28/21 0805 04/28/21 0830  BP: 140/63 (!) 150/74 133/73 133/87  Pulse: 81 73 82 82  Resp:  '17 16 17  '$ Temp:   98.8 F (37.1 C)   TempSrc:   Oral   SpO2: 90% 93% 93% 95%  Weight:      Height:        Intake/Output Summary (Last 24 hours) at 04/28/2021 1123 Last data filed at 04/28/2021 0807 Gross per 24 hour  Intake 500 ml  Output 1550 ml  Net -1050 ml   Filed Weights   04/27/21 0935  Weight: 79.4 kg    Examination:  General exam: Appears calm and comfortable  Respiratory system: Clear to auscultation. Respiratory effort normal. Cardiovascular system: S1 & S2 heard, RRR. No JVD, murmurs, rubs, gallops or clicks. No pedal edema. Gastrointestinal system: Abdomen is nondistended, soft and nontender. No organomegaly or masses felt. Normal bowel sounds heard. Central nervous system: Alert and oriented. Continues to have some mild LUE weakness Extremities: no edema bilaterally Skin: No rashes, lesions or ulcers Psychiatry: Judgement and insight appear normal. Mood & affect appropriate.     Data Reviewed: I have personally reviewed following labs and imaging studies  CBC: Recent Labs  Lab  04/27/21 0950 04/27/21 2215  WBC 9.1 17.7*  NEUTROABS 7.2  --   HGB 14.7 14.3  HCT 45.9 43.3  MCV 98.9 97.7  PLT 143* [id number]*   Basic Metabolic Panel: Recent Labs  Lab 04/27/21 0950 04/27/21 2215  NA 138  --   K 4.0  --   CL 102  --   CO2 29  --   GLUCOSE 124*  --   BUN 19  --   CREATININE 1.52* 1.13  CALCIUM 9.3  --    GFR: Estimated Creatinine Clearance: 58.6 mL/min (by C-G formula based on SCr of 1.13 mg/dL). Liver Function Tests: Recent Labs  Lab 04/27/21 0950  AST 40  ALT 21  ALKPHOS 40  BILITOT 0.5  PROT 8.7*  ALBUMIN 4.4   No results for input(s): LIPASE, AMYLASE in the last 168 hours. No results for input(s): AMMONIA in the last 168 hours. Coagulation Profile: Recent Labs  Lab 04/27/21 0950  INR 1.0   Cardiac Enzymes: No results for input(s): CKTOTAL, CKMB, CKMBINDEX, TROPONINI in the last 168 hours. BNP (last 3 results) No results for input(s): PROBNP in the last 8760 hours.  HbA1C: No results for input(s): HGBA1C in the last 72 hours. CBG: Recent Labs  Lab 04/27/21 0937  GLUCAP 117*   Lipid Profile: Recent Labs    04/28/21 0537  CHOL 74  HDL 45  LDLCALC 20  TRIG 47  CHOLHDL 1.6   Thyroid Function Tests: No results for input(s): TSH, T4TOTAL, FREET4, T3FREE, THYROIDAB in the last 72 hours. Anemia Panel: No results for input(s): VITAMINB12, FOLATE, FERRITIN, TIBC, IRON, RETICCTPCT in the last 72 hours. Sepsis Labs: No results for input(s): PROCALCITON, LATICACIDVEN in the last 168 hours.  Recent Results (from the past 240 hour(s))  Resp Panel by RT-PCR (Flu A&B, Covid) Nasopharyngeal Swab     Status: None   Collection Time: 04/27/21  9:42 AM   Specimen: Nasopharyngeal Swab; Nasopharyngeal(NP) swabs in vial transport medium  Result Value Ref Range Status   SARS Coronavirus 2 by RT PCR NEGATIVE NEGATIVE Final    Comment: (NOTE) SARS-CoV-2 target nucleic acids are NOT DETECTED.  The SARS-CoV-2 RNA is generally detectable in upper  respiratory specimens during the acute phase of infection. The lowest concentration of SARS-CoV-2 viral copies this assay can detect is 138 copies/mL. A negative result does not preclude SARS-Cov-2 infection and should not be used as the sole basis for treatment or other patient management decisions. A negative result may occur with  improper specimen collection/handling, submission of specimen other than nasopharyngeal swab, presence of viral mutation(s) within the areas targeted by this assay, and inadequate number of viral copies(<138 copies/mL). A negative result must be combined with clinical observations, patient history, and epidemiological information. The expected result is Negative.  Fact Sheet for Patients:  EntrepreneurPulse.com.au  Fact Sheet for Healthcare Providers:  IncredibleEmployment.be  This test is no t yet approved or cleared by the [place] FDA and  has been authorized for detection and/or diagnosis of SARS-CoV-2 by FDA under an Emergency Use Authorization (EUA). This EUA will remain  in effect (meaning this test can be used) for the duration of the COVID-19 declaration under Section 564(b)(1) of the Act, 21 U.S.C.section 360bbb-3(b)(1), unless the authorization is terminated  or revoked sooner.       Influenza A by PCR NEGATIVE NEGATIVE Final   Influenza B by PCR NEGATIVE NEGATIVE Final    Comment: (NOTE) The Xpert Xpress SARS-CoV-2/FLU/RSV plus assay is intended as an aid in the diagnosis of influenza from Nasopharyngeal swab specimens and should not be used as a sole basis for treatment. Nasal washings and aspirates are unacceptable for Xpert Xpress SARS-CoV-2/FLU/RSV testing.  Fact Sheet for Patients: EntrepreneurPulse.com.au  Fact Sheet for Healthcare Providers: IncredibleEmployment.be  This test is not yet approved or cleared by the [place] FDA and has been  authorized for detection and/or diagnosis of SARS-CoV-2 by FDA under an Emergency Use Authorization (EUA). This EUA will remain in effect (meaning this test can be used) for the duration of the COVID-19 declaration under Section 564(b)(1) of the Act, 21 U.S.C. section 360bbb-3(b)(1), unless the authorization is terminated or revoked.  Performed at [facility], [address] [place], [place] [address]          Radiology Studies: CT Angio Head W or Wo Contrast  Result Date: 04/27/2021 CLINICAL DATA:  Increased left-sided weakness.  History of stroke EXAM: CT ANGIOGRAPHY HEAD AND NECK TECHNIQUE: Multidetector CT imaging of the head and neck was performed using the standard protocol during bolus administration of intravenous contrast. Multiplanar CT image reconstructions and MIPs were obtained to evaluate the vascular anatomy.  Carotid stenosis measurements (when applicable) are obtained utilizing NASCET criteria, using the distal internal carotid diameter as the denominator. CONTRAST:  41m OMNIPAQUE IOHEXOL 350 MG/ML SOLN COMPARISON:  Brain MRI 01/06/2021 FINDINGS: CT HEAD FINDINGS Brain: Small remote left occipital cortex infarct. No evidence of acute or interval infarct. No hemorrhage, hydrocephalus, or masslike finding Vascular: Hyperdense right ICA at the skull base. Skull: Negative Sinuses: Clear Orbits: Bilateral cataract resection Review of the MIP images confirms the above findings CTA NECK FINDINGS Aortic arch: Atheromatous plaque.  No acute finding. Right carotid system: Interval occlusion of the right common carotid and ICA with reconstituted ECA branches showing clot at the mainstem. No vessel collapse. There has been prior right-sided carotid stenting. Left carotid system: Scattered atheromatous calcification of the carotid and proximal ICA without flow limiting stenosis or ulceration Vertebral arteries: Proximal subclavian atherosclerosis without flow limiting stenosis. Calcified plaque  at both vertebral ostia with mild to moderate narrowing. The left vertebral artery is dominant. Skeleton: No acute finding Other neck: Sequela of radiotherapy.  No mass or adenopathy. Upper chest: Biapical pleural based scarring from radiotherapy. Review of the MIP images confirms the above findings CTA HEAD FINDINGS Anterior circulation: Right ICA occlusion with reconstitution at the supraclinoid level. Tiny communicating arteries are present anteriorly and posteriorly. No downstream branch occlusion is seen. Negative for aneurysm Posterior circulation: The vertebral and basilar arteries are smoothly contoured and widely patent. No branch occlusion, beading, or aneurysm Venous sinuses: Patent. Anatomic variants: None significant Review of the MIP images confirms the above findings Critical Value/emergent results were called by telephone at the time of interpretation on 04/27/2021 at 10:56 am [facility], who verbally acknowledged these results. IMPRESSION: 1. New right carotid occlusion with supraclinoid reconstitution from small communicating arteries. 2. No acute infarct by noncontrast head CT. Electronically Signed   By: JMonte FantasiaM.D.   On: 04/27/2021 10:58   CT Angio Neck W and/or Wo Contrast  Result Date: 04/27/2021 CLINICAL DATA:  Increased left-sided weakness.  History of stroke EXAM: CT ANGIOGRAPHY HEAD AND NECK TECHNIQUE: Multidetector CT imaging of the head and neck was performed using the standard protocol during bolus administration of intravenous contrast. Multiplanar CT image reconstructions and MIPs were obtained to evaluate the vascular anatomy. Carotid stenosis measurements (when applicable) are obtained utilizing NASCET criteria, using the distal internal carotid diameter as the denominator. CONTRAST:  736mOMNIPAQUE IOHEXOL 350 MG/ML SOLN COMPARISON:  Brain MRI 01/06/2021 FINDINGS: CT HEAD FINDINGS Brain: Small remote left occipital cortex infarct. No evidence of acute or  interval infarct. No hemorrhage, hydrocephalus, or masslike finding Vascular: Hyperdense right ICA at the skull base. Skull: Negative Sinuses: Clear Orbits: Bilateral cataract resection Review of the MIP images confirms the above findings CTA NECK FINDINGS Aortic arch: Atheromatous plaque.  No acute finding. Right carotid system: Interval occlusion of the right common carotid and ICA with reconstituted ECA branches showing clot at the mainstem. No vessel collapse. There has been prior right-sided carotid stenting. Left carotid system: Scattered atheromatous calcification of the carotid and proximal ICA without flow limiting stenosis or ulceration Vertebral arteries: Proximal subclavian atherosclerosis without flow limiting stenosis. Calcified plaque at both vertebral ostia with mild to moderate narrowing. The left vertebral artery is dominant. Skeleton: No acute finding Other neck: Sequela of radiotherapy.  No mass or adenopathy. Upper chest: Biapical pleural based scarring from radiotherapy. Review of the MIP images confirms the above findings CTA HEAD FINDINGS Anterior circulation: Right ICA occlusion with reconstitution at  the supraclinoid level. Tiny communicating arteries are present anteriorly and posteriorly. No downstream branch occlusion is seen. Negative for aneurysm Posterior circulation: The vertebral and basilar arteries are smoothly contoured and widely patent. No branch occlusion, beading, or aneurysm Venous sinuses: Patent. Anatomic variants: None significant Review of the MIP images confirms the above findings Critical Value/emergent results were called by telephone at the time of interpretation on 04/27/2021 at 10:56 am to provider [facility] , who verbally acknowledged these results. IMPRESSION: 1. New right carotid occlusion with supraclinoid reconstitution from small communicating arteries. 2. No acute infarct by noncontrast head CT. Electronically Signed   By: Monte Fantasia M.D.   On:  04/27/2021 10:58        Scheduled Meds:   stroke: mapping our early stages of recovery book   Does not apply Once   aspirin  81 mg Oral Daily   donepezil  5 mg Oral QHS   fludrocortisone  100 mcg Oral Daily   gabapentin  900 mg Oral TID   heparin  5,000 Units Subcutaneous Q8H   levothyroxine  100 mcg Oral QAC breakfast   pilocarpine  7.5 mg Oral TID   QUEtiapine  50 mg Oral QHS   rosuvastatin  20 mg Oral Daily   ticagrelor  90 mg Oral BID   Continuous Infusions:  lactated ringers 75 mL/hr at 04/28/21 0903     LOS: 0 days    Time spent: 16mns    JKathie Dike MD [facility]   If 7PM-7AM, please contact night-coverage [email or web address]  04/28/2021, 11:23 AM
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
## 2021-04-28 NOTE — ED Notes (Signed)
Report given to [facility]  

## 2021-04-28 NOTE — ED Notes (Signed)
Dr Olevia Bowens made aware of BP and HR increasing. New orders received

## 2021-04-28 NOTE — Progress Notes (Signed)
*  PRELIMINARY RESULTS* Echocardiogram 2D Echocardiogram has been performed.  George Bradley 04/28/2021, 11:51 AM

## 2021-04-28 NOTE — Progress Notes (Signed)
  Speech Language Pathology Treatment: Dysphagia  Patient Details Name: George Bradley MRN: [id number] DOB: 09/19/1941 Today's Date: 04/28/2021 Time: [id number] SLP Time Calculation (min) (ACUTE ONLY): 23 min  Assessment / Plan / Recommendation Clinical Impression  Pt seen for ongoing dysphagia intervention following BSE completed yesterday. Pt is much more alert and oriented this date. He recognized this SLP from previous visits and engaged in a conversation about golfing and location of his wife. He reports feeling much better than yesterday. Pt  assessed with cup and straw sips water, puree, and mech soft textures. Pt with one delayed throat clear over course of trials and one delayed congested cough. He acknowledges history of dysphagia due to previous treatment for oropharyngeal cancer and post radiation sequelae (always has phlegm to clear). Recommend D3/mech soft and thin liquids with aspiration and reflux precautions (repeat swallows, clear throat/cough periodically, and oral care) and po medications whole with water or in puree. SLP to continue to follow in acute setting. He his being transferred to [facility]. Above to RN.    HPI HPI: George Bradley is a 80 y.o. male with medical history significant of previous right MCA stroke in 11/2020 status post thrombectomy, history of with history of dementia, neuropathy, hypothyroidism, hyperlipidemia, oropharyngeal cancer, GERD, and coronary artery disease. He reports he was in his usual state of health when he went to bed yesterday.  When he woke up this morning, he got up to go to the bathroom and felt that his legs are weak, causing him to fall to the floor.  He was able to crawl back into bed eventually.  This occurred at approximately 8 AM.  When his daughter arrived EMS was called and it was noted that he was having some slurring of his speech and left upper extremity weakness. He does report passing his urine and moving his bowels while laying on the  floor since he was unable to make it to the bathroom. CT angiogram of the head and neck was performed that did not show any evidence of infarct or bleed. History of several hospitalization discharge April 18 postacute CVA, left-sided weakness, April 6 aspiration pneumonia. Pt had MBSS during acute stay at [facility] on 12/28/20 with recommendation for D3/mechanical soft and thin liquids (does aspirate small amounts of thin).      SLP Plan  Continue with current plan of care       Recommendations  Diet recommendations: Dysphagia 3 (mechanical soft);Thin liquid Liquids provided via: Cup;Straw Medication Administration: Whole meds with liquid Supervision: Patient able to self feed Compensations: Multiple dry swallows after each bite/sip;Clear throat intermittently;Hard cough after swallow Postural Changes and/or Swallow Maneuvers: Seated upright 90 degrees;Upright 30-60 min after meal                Oral Care Recommendations: Oral care BID Follow up Recommendations:  (pending) SLP Visit Diagnosis: Dysphagia, unspecified (R13.10) Plan: Continue with current plan of care       Thank you,  George Bradley, George Bradley                 George Bradley 04/28/2021, 12:18 PM

## 2021-04-28 NOTE — ED Notes (Signed)
Changed out urine canister ?

## 2021-04-28 NOTE — Plan of Care (Signed)

## 2021-04-28 NOTE — Progress Notes (Addendum)
Patient arrived to [place], reports symptom remain the same, case discussed with neurointerventionalist Dr Estanislado Pandy who recommend  continue asa/brilinta, statin, consult neurology, keep patient npo after midnight for diagsnostic angiogram tomorrow. Neurology consulted will see patient today.

## 2021-04-29 ENCOUNTER — Inpatient Hospital Stay (HOSPITAL_COMMUNITY): Payer: Medicare Other

## 2021-04-29 DIAGNOSIS — I6521 Occlusion and stenosis of right carotid artery: Principal | ICD-10-CM

## 2021-04-29 DIAGNOSIS — E43 Unspecified severe protein-calorie malnutrition: Secondary | ICD-10-CM | POA: Insufficient documentation

## 2021-04-29 DIAGNOSIS — I951 Orthostatic hypotension: Secondary | ICD-10-CM

## 2021-04-29 LAB — BASIC METABOLIC PANEL
Anion gap: 7 (ref 5–15)
BUN: 14 mg/dL (ref 8–23)
CO2: 29 mmol/L (ref 22–32)
Calcium: 9.1 mg/dL (ref 8.9–10.3)
Chloride: 104 mmol/L (ref 98–111)
Creatinine, Ser: 1.09 mg/dL (ref 0.61–1.24)
GFR, Estimated: >60 mL/min (ref >60)
Glucose, Bld: 95 mg/dL (ref 70–99)
Potassium: 3.4 mmol/L — ABNORMAL LOW (ref 3.5–5.1)
Sodium: 140 mmol/L (ref 135–145)

## 2021-04-29 LAB — CBC
HCT: 40.1 % (ref 39.0–52.0)
Hemoglobin: 13.3 g/dL (ref 13.0–17.0)
MCH: 31.3 pg (ref 26.0–34.0)
MCHC: 33.2 g/dL (ref 30.0–36.0)
MCV: 94.4 fL (ref 80.0–100.0)
Platelets: 126 10*3/uL — ABNORMAL LOW (ref 150–400)
RBC: 4.25 MIL/uL (ref 4.22–5.81)
RDW: 14.9 % (ref 11.5–15.5)
WBC: 7.5 10*3/uL (ref 4.0–10.5)
nRBC: 0 % (ref 0.0–0.2)

## 2021-04-29 MED ORDER — POTASSIUM CHLORIDE 10 MEQ/100ML IV SOLN
10.0000 meq | INTRAVENOUS | Status: AC
  Administered 2021-04-29 (×4): 10 meq via INTRAVENOUS
  Filled 2021-04-29 (×4): qty 100

## 2021-04-29 MED ORDER — SODIUM CHLORIDE 0.9 % IV SOLN: INTRAVENOUS | Status: DC

## 2021-04-29 MED ORDER — ADULT MULTIVITAMIN W/MINERALS CH
1.0000 | ORAL_TABLET | Freq: Every day | ORAL | Status: DC
  Administered 2021-04-29 – 2021-04-30 (×2): 1 via ORAL
  Filled 2021-04-29 (×2): qty 1

## 2021-04-29 NOTE — Progress Notes (Signed)
Initial Nutrition Assessment  DOCUMENTATION CODES:  Severe malnutrition in context of chronic illness  INTERVENTION:  Add Vital Cuisine Shake BID, each supplement provides 520 kcal and 22 grams of protein.  Add Magic cup TID with meals, each supplement provides 290 kcal and 9 grams of protein.  Add MVI with minerals daily.  NUTRITION DIAGNOSIS:  Severe Malnutrition related to chronic illness (dementia, oral cancer (dysphagia)) as evidenced by severe fat depletion, severe muscle depletion, percent weight loss, energy intake < or equal to 50% for > or equal to 5 days.  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 8/6 - admitted to [facility] with slurred speech, LUE weakness and BLE weakness resulting in a fall while walking to the bathroom at home 8/7 - transferred to [facility] for further work-up 8/8 - IR  Spoke with pt and wife at bedside. Pt reports that he did not eat very well over the past week as his wife left town last week. He reports eating "next to nothing" because his appetite was so poor. Wife endorses this. Per Epic, pt ate 25% of dinner last night.  Wife also reports pt has lost 5 lbs in the last week. Per Epic, pt has lost ~6 lbs (3.5%) in the last 2 weeks, which is significant and severe for the time frame.  On exam, pt with moderate to severe depletions throughout.  Recommend adding Hormel shakes BID and Magic Cup TID, as well as MVI with minerals daily. Pt does not like Ensure or Boost products.  Medications: reviewed; Synthroid, LR @ 75  ml/hr via IV  Labs: reviewed; K 3.4 (L)  NUTRITION - FOCUSED PHYSICAL EXAM: Flowsheet Row Most Recent Value  Orbital Region Severe depletion  Upper Arm Region Moderate depletion  Thoracic and Lumbar Region Mild depletion  Buccal Region Moderate depletion  Temple Region Moderate depletion  Clavicle Bone Region Severe depletion  Clavicle and Acromion Bone Region Severe depletion  Scapular Bone Region Severe depletion  Dorsal Hand Moderate depletion  Patellar Region Severe depletion  Anterior Thigh Region Severe depletion  Posterior Calf Region Severe depletion  Edema (RD Assessment) None  Hair Reviewed  Eyes Reviewed  Mouth Reviewed  Skin Reviewed  Nails Reviewed   Diet Order:   Diet Order             DIET DYS 3 Room service appropriate? Yes with Assist; Fluid consistency: Thin  Diet effective now                  EDUCATION NEEDS:  Education needs have been addressed  Skin:  Skin Assessment: Skin Integrity Issues: Skin Integrity Issues:: Other (Comment) Other: Several skin tears  Last BM:  04/29/21 - Type 5, medium  Height:  Ht Readings from Last 1 Encounters:  04/27/21 '6\' 1"'$  (1.854 m)   Weight:  Wt Readings from Last 1 Encounters:  04/27/21 79.4 kg   BMI:  Body mass index is 23.09 kg/m.  Estimated Nutritional Needs:  Kcal:  2050-2250 Protein:  90-105 grams Fluid:  >2 L  Derrel Nip, RD, LDN (  she/her/hers) Registered Dietitian I After-Hours/Weekend Pager # in [place]

## 2021-04-29 NOTE — Evaluation (Signed)
Occupational Therapy Evaluation Patient Details Name: George Bradley MRN: [id number] DOB: 06/16/1941 Today's Date: 04/29/2021    History of Present Illness 80 y.o. male presenting to [facility] ED 8/6 with slurred speech, LUE weakness and BLE weakness resulting in a fall while walking to the bathroom at home. Imaging suggestive of new RICA occlusion. Transferred to [facility] for further work-up. MRI pending. Plan for IR 8/8. 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   Clinical Impression   PTA patient was living with his spouse in a private residence and was grossly I with ADLs/IADLs without AD. Patient was driving. Evaluation limited this date secondary to HTN and orthostatic hypotension with further mobility deferred. Patient able to transfer from supine to and from EOB with supervision A for line management and sit to stand from slightly elevated EOB with close supervision A. Patient also limited by deficits listed below including generalized weakness and decreased activity tolerance and would benefit from continued acute OT services in prep for safe d/c home with family. Patient likely to progress well without need for follow-up OT.    BP assess throughout session. 152/112 in supine, 180/103 seated EOB and 79/66 in standing. Mobility deferred 2/2 orthostatics.   Follow Up Recommendations  No OT follow up;Supervision/Assistance - 24 hour    Equipment Recommendations  Other (comment) (Patient has necessary DME.)    Recommendations for Other Services       Precautions / Restrictions Precautions Precautions: Fall Precaution Comments: Extreme hyper/hypotension; orthostatic Restrictions Weight Bearing Restrictions: No      Mobility Bed Mobility Overal bed mobility: Needs Assistance Bed Mobility: Supine to Sit;Sit to Supine      Supine to sit: Supervision Sit to supine: Supervision   General bed mobility comments: Supervision A for safety/line management; cues for pacing    Transfers Overall transfer level: Needs assistance Equipment used: None Transfers: Sit to/from Stand;Stand Pivot Transfers Sit to Stand: Supervision Stand pivot transfers: Supervision       General transfer comment: Supervision A for safety.    Balance Overall balance assessment: Needs assistance Sitting-balance support: No upper extremity supported Sitting balance-Leahy Scale: Good     Standing balance support: Single extremity supported Standing balance-Leahy Scale: Fair                             ADL either performed or assessed with clinical judgement   ADL Overall ADL's : Needs assistance/impaired     Grooming: Set up;Sitting Grooming Details (indicate cue type and reason): 2/3 grooming tasks seated EOB with set-up assist; unable to perform standing at sink level 2/2 elevated BP then orthostaics upon standing                                     Vision Baseline Vision/History: Wears glasses Wears Glasses: Reading only Patient Visual Report: No change from baseline Vision Assessment?: No apparent visual deficits Additional Comments: Able to correctly identify time on wall clock     Perception     Praxis      Pertinent Vitals/Pain Pain Assessment: No/denies pain     Hand Dominance Right   Extremity/Trunk Assessment Upper Extremity Assessment Upper Extremity Assessment: Generalized weakness   Lower Extremity Assessment Lower Extremity Assessment: Generalized weakness  Cervical / Trunk Assessment Cervical / Trunk Assessment: Kyphotic;Other exceptions Cervical / Trunk Exceptions: Forward head   Communication Communication Communication: No difficulties   Cognition Arousal/Alertness: Awake/alert Behavior During Therapy: WFL for tasks assessed/performed Overall Cognitive  Status: Within Functional Limits for tasks assessed                                     General Comments  Wife present throughout assessment. BP assess throughout session. 152/112 in supine, 180/103 seated EOB and 79/66 in standing. Mobility deferred 2/2 orthostatics.    Exercises     Shoulder Instructions      Home Living Family/patient expects to be discharged to:: Private residence Living Arrangements: Spouse/significant other Available Help at Discharge: Family;Available 24 hours/day Type of Home: House Home Access: Stairs to enter [employer] of Steps: 3 thru the garage Entrance Stairs-Rails: Right Home Layout: Two level;Able to live on main level with bedroom/bathroom Alternate Level Stairs-Number of Steps: Flight   Bathroom Shower/Tub: Occupational psychologist: Handicapped height Bathroom Accessibility: Yes   Home Equipment: Civil engineer, contracting - built in;Hand held Tourist information centre manager - 2 wheels;Cane - single point;Cane - quad;Wheelchair - manual;Walker - 4 wheels;Grab bars - toilet;Grab bars - tub/shower   Additional Comments: Wife is a retired acute PT      Prior Functioning/Environment Level of Independence: Independent        Comments: Performing ADLs, light IADLs- riding lawn mower, and driving. (Reports spouse is trying to convince him not to drive.)        OT Problem List: Decreased strength;Decreased activity tolerance;Impaired balance (sitting and/or standing);Decreased cognition;Decreased safety awareness      OT Treatment/Interventions: Self-care/ADL training;Therapeutic exercise;Energy conservation;DME and/or AE instruction;Therapeutic activities;Cognitive remediation/compensation;Patient/family education;Balance training    OT Goals(Current goals can be found in the care plan section) Acute Rehab OT Goals Patient Stated Goal: Return home OT Goal Formulation: With patient Time For Goal Achievement: 05/13/21 Potential to  Achieve Goals: Good  OT Frequency: Min 2X/week   Barriers to D/C:            Co-evaluation              AM-PAC OT "6 Clicks" Daily Activity     Outcome Measure Help from another person eating meals?: None Help from another person taking care of personal grooming?: A Little Help from another person toileting, which includes using toliet, bedpan, or urinal?: A Little Help from another person bathing (including washing, rinsing, drying)?: A Little Help from another person to put on and taking off regular upper body clothing?: A Little Help from another person to put on and taking off regular lower body clothing?: A Little 6 Click Score: 19   End of Session Equipment Utilized During Treatment: Gait belt Nurse Communication: Mobility status;Other (comment) (Response to treatment)  Activity Tolerance: Treatment limited secondary to medical complications (Comment) (Orthostatic hypotension) Patient left: in bed;with call bell/phone within reach;with bed alarm set;with family/visitor present  OT Visit Diagnosis: Unsteadiness on feet (R26.81);Muscle weakness (generalized) (M62.81);History of falling (Z91.81)                Time: [id number] OT Time Calculation (min): 29 min Charges:  OT General Charges $OT Visit: 1 Visit OT Evaluation $OT Eval Low Complexity: 1 Low OT Treatments $Self Care/Home Management : 8-22 mins  Bryann Mcnealy H. OTR/L Supplemental OT, Department of rehab services [phone]  Finesse Fielder R H. 04/29/2021, 11:59  AM

## 2021-04-29 NOTE — Plan of Care (Signed)

## 2021-04-29 NOTE — Consult Note (Signed)
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 cm  LV e' medial:    4.13 cm/s LV PW:  1.00 cm  LV E/e' medial:  18.0 LV IVS:        1.40 cm  LV e' lateral:   5.22 cm/s LVOT diam:     2.10 cm  LV E/e' lateral: 14.2 LV SV:         65 LV SV Index:   32 LVOT Area:     3.46 cm  RIGHT VENTRICLE RV S prime:     8.70 cm/s TAPSE (M-mode): 1.4 cm LEFT ATRIUM             Index       RIGHT ATRIUM           Index LA diam:        3.90 cm 1.92 cm/m  RA Area:     18.10 cm LA Vol (A2C):   78.0 ml 38.37 ml/m RA Volume:   43.10 ml  21.20 ml/m LA Vol (A4C):   59.3 ml 29.17 ml/m LA Biplane Vol: 69.1 ml 33.99 ml/m  AORTIC VALVE AV Area (Vmax):    1.10 cm AV Area (Vmean):   1.04 cm AV Area (VTI):     1.07 cm AV Vmax:           295.00 cm/s AV Vmean:          205.667 cm/s AV VTI:             0.606 m AV Peak Grad:      34.8 mmHg AV Mean Grad:      21.0 mmHg LVOT Vmax:         93.35 cm/s LVOT Vmean:        61.850 cm/s LVOT VTI:          0.188 m LVOT/AV VTI ratio: 0.31  AORTA Ao Root diam: 3.50 cm MITRAL VALVE                TRICUSPID VALVE MV Area (PHT): 2.10 cm     TR Peak grad:   24.2 mmHg MV Decel Time: 362 msec     TR Vmax:        246.00 cm/s MV E velocity: 74.15 cm/s MV A velocity: 107.20 cm/s  SHUNTS MV E/A ratio:  0.69         Systemic VTI:  0.19 m                             Systemic Diam: 2.10 cm Cherlynn Kaiser MD Electronically signed by Cherlynn Kaiser MD Signature Date/Time: 04/28/2021/12:53:28 PM    Final     Labs:  CBC: Recent Labs    01/24/21 0538 04/27/21 0950 04/27/21 2215 04/29/21 0444  WBC 7.8 9.1 17.7* 7.5  HGB 12.2* 14.7 14.3 13.3  HCT 37.6* 45.9 43.3 40.1  PLT 155 143* 149* 126*    COAGS: Recent Labs    12/27/20 0310 01/06/21 0324 01/21/21 1719 04/27/21 0950  INR 1.2 1.0 1.0 1.0  APTT 32 23* 23* 25    BMP: Recent Labs    06/21/20 0744 08/14/20 1431 12/19/20 1201 01/21/21 1719 01/23/21 0923 04/27/21 0950 04/27/21 2215 04/29/21 0444  NA 140 141   < > 135 139 138  --  140  K 4.6 4.4   < > 3.9 3.9 4.0  --  3.4*  CL 103 102   < > 97* 102 102  --  104  CO2 27 26   < > '29 30 29  '$ --  29  GLUCOSE 102* 84   < > 131* 109* 124*  --  95  BUN 28* 19   < > '22 16 19  '$ --  14  CALCIUM 9.1 9.3   < > 10.0 9.4 9.3  --  9.1  CREATININE 1.64* 1.28*   < > 1.32* 1.00 1.52* 1.13 1.09  GFRNONAA 39* 53*   < > 55* >60 46* >60 >60  GFRAA 45* 61  --   --   --   --   --   --    < > = values in this interval not displayed.    LIVER FUNCTION TESTS: Recent Labs    01/07/21 0454 01/21/21 1719 01/23/21 0923 04/27/21 0950  BILITOT 0.3 0.5 0.6 0.5  AST 37 45* 23 40  ALT 32 60* 33 21  ALKPHOS 37* 41 31* 40  PROT 7.0 8.7* 7.2 8.7*  ALBUMIN 3.3* 4.3 3.5 4.4     Assessment and Plan:  80 y.o. male inpatient. History of dementia, CAD s/p CABG in 2000.  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 Karenann Cai.  Patient recently seen by NIR where a IUS performed showed the stent to be open. Brilinta was discontinued at that time. Patient presented to [facility] on 8.6.22 after fall from home with  bilateral lower extremity weakness. CT Angio from 8.6.22 reads New right carotid occlusion with supraclinoid reconstitution from small communicating arteries. Patient transferred to [facility] for further evaluation and possible intervention. MRI from 8.8.22 reads Abnormal flow in the right internal carotid artery consistent with the CT a findings of right ICA occlusion. Multiple punctate acute infarctions affecting the cortical brain of the right hemisphere, probably watershed distribution. Micro embolic infarctions are possible but less likely. No large or confluent infarction. No mass effect or hemorrhage. Old small left occipital and right frontal cortical infarctions. Minimal small vessel change of the white matter. Team is requesting evaluation for possible  diagnostic angiogram with possible intervention.  Patient and wife seen at beside with Dr. Katherina Right Rodriques . Patient states that he is back to baseline with no neurology deficits at this time.  Dr. Katherina Right Rodriques  explained that there are 2 management options moving forward either conservative management including return to anticoagulation and  monitor for changes or with an endovascular cerebral angiogram with possible revascularization procedure. Due to there fact that the Patient has no neurological deficit at this time coupled with the increase risk of clot migration the  Patient and the Patient's wife expresses desire for the less evasive option at this time.   The Patient will be treated using conservative management at this time. Recommend the Patient be placed back on Brilinta BID at this time. Should Patient condition change please notify IR. The patient and his wife verbalized understanding and are in agreement with the plan of care.     Thank you for this interesting consult.  I greatly enjoyed meeting George Bradley and look forward to participating in their care.  A copy of this report was sent to the requesting provider on this date.  Electronically Signed: Jacqualine Mau, NP 04/29/2021, 8:52 AM   I spent a total of 40 Minutes    in face to face in clinical  consultation, greater than 50% of which was counseling/coordinating care for evaluation for right ICA occlusion 80

## 2021-04-29 NOTE — Progress Notes (Addendum)
STROKE TEAM PROGRESS NOTE   ATTENDING NOTE: I reviewed above note and agree with the assessment and plan. Pt was seen and examined.   80 year old male with history of CAD status post CABG, HLD, orthostatic hypotension, CKD, right renal artery stenosis, aortic stenosis, dementia, tongue cancer with chronic dysphagia, strokes presented to ER for right-sided weakness and aphasia.  In 11/2020 admitted for right MCA 2 punctate infarcts.  CTA head and neck showed right CCA/ICA occlusion.  Patient then developed left upper extremity weakness and left facial droop, MRI showed new right MCA bifurcation embolus. S/p thrombectomy with TICI3 reperfusion and Right ICA stenting.  MRI repeat showed a few more punctate infarct at right MCA territory.  EF 50 to 55%.  A1c 6.3, LDL 30.  Patient recovered well,  discharged on aspirin Brilinta and Crestor.  In 12/2020 patient admitted for right BG infarct in the setting of severe orthostatic hypotension.  CTA head and neck showed 50% stenosis of right ICA stent.  EF 50 to 55%, LDL 30 A1c 6.3.  Continue aspirin, Brilinta and Crestor 20.  Put on TED hose for orthostatic hypotension.  He follows with Dr. Norma Fredrickson interventional radiologist in 03/2021, carotid Doppler showed unremarkable.  Brilinta discontinued.  However on this admission, patient developed left-sided weakness in the setting of whole body weakness.  CT no acute abnormality.  CT head and neck right CCA/ICA occlusion.  MRI showed multiple punctate acute infarction and right cortical watershed distribution.  EF 50 to 55%, A1c 6.0, LDL 20.  Creatinine 1.09.  On checking orthostatic vitals, patient again had severe hypotension on standing up, still consistent with severe orthostatic hypotension.  He is ready on Florinef and Salagen, Dr. Tawanna Solo is considering midodrine.  Discussed with Dr. Norma Fredrickson interventional radiology, patient current stroke likely due to right ICA/CCA occlusion.  Now put back on aspirin and  Brilinta, continue Crestor 20.  We will repeat CT head and neck in 1 to 2 months to evaluate right ICA stent.  Discussed with Dr. Tawanna Solo for aggressive treatment for orthostatic hypotension.  For detailed assessment and plan, please refer to above as I have made changes wherever appropriate.   Neurology will sign off. Please call with questions. Pt will follow up with Dr. Felecia Shelling at [facility] in about 4 weeks. Thanks for the consult.   Rosalin Hawking, MD PhD Stroke Neurology 04/29/2021 6:44 PM    INTERVAL HISTORY His wife is at the bedside.  Patient sitting in bed, NAD. Per patient IR spoke with patient and felt that it is too risky to clear the blockage in his artery.   Vitals:   04/28/21 1339 04/28/21 1428 04/28/21 2011 04/29/21 0417  BP: 132/77 138/82 (!) 141/100 113/62  Pulse: 75 74 100 72  Resp: '17 18 20   '$ Temp: 98.3 F (36.8 C) 98.4 F (36.9 C) 98.4 F (36.9 C) 98.1 F (36.7 C)  TempSrc: Oral Oral  Oral  SpO2: 96% 97% 97% 94%  Weight:      Height:       CBC:  Recent Labs  Lab 04/27/21 0950 04/27/21 2215 04/29/21 0444  WBC 9.1 17.7* 7.5  NEUTROABS 7.2  --   --   HGB 14.7 14.3 13.3  HCT 45.9 43.3 40.1  MCV 98.9 97.7 94.4  PLT 143* 149* [id number]*   Basic Metabolic Panel:  Recent Labs  Lab 04/27/21 0950 04/27/21 2215 04/29/21 0444  NA 138  --  140  K 4.0  --  3.4*  CL 102  --  104  CO2 29  --  29  GLUCOSE 124*  --  95  BUN 19  --  14  CREATININE 1.52* 1.13 1.09  CALCIUM 9.3  --  9.1   Lipid Panel:  Recent Labs  Lab 04/28/21 0537  CHOL 74  TRIG 47  HDL 45  CHOLHDL 1.6  VLDL 9  LDLCALC 20   HgbA1c:  Recent Labs  Lab 04/28/21 0537  HGBA1C 6.0*   Urine Drug Screen:  Recent Labs  Lab 04/27/21 1156  LABOPIA NONE DETECTED  COCAINSCRNUR NONE DETECTED  LABBENZ POSITIVE*  AMPHETMU NONE DETECTED  THCU NONE DETECTED  LABBARB NONE DETECTED    Alcohol Level  Recent Labs  Lab 04/27/21 0950  ETH <10    IMAGING past 24 hours DG Abd 1 View  Result  Date: 04/29/2021 CLINICAL DATA:  Abdominal pain, screen for metal, patient is scheduled to have an MRI EXAM: ABDOMEN - 1 VIEW COMPARISON:  CT abdomen/pelvis 12/26/2020 FINDINGS: There is a non obstructive bowel gas pattern. Left upper quadrant surgical clips are noted. There is a curvilinear metallic density projecting over the medial right upper abdomen of uncertain etiology, not present on the CT abdomen/pelvis of 12/26/2020. Median sternotomy wires are noted. The soft tissues are unremarkable. There is no acute osseous abnormality. IMPRESSION: Curvilinear metallic density projecting over the right upper abdomen is of uncertain etiology, but may reflect a surgical facemask. Otherwise, left upper quadrant surgical clips and median sternotomy wires are present. Electronically Signed   By: Valetta Mole MD   On: 04/29/2021 08:22   DG CHEST PORT 1 VIEW  Result Date: 04/28/2021 CLINICAL DATA:  Leukocytosis EXAM: PORTABLE CHEST 1 VIEW COMPARISON:  01/21/2021 and prior studies FINDINGS: The cardiomediastinal silhouette is unremarkable. Median sternotomy again noted. There is no evidence of focal airspace disease, pulmonary edema, suspicious pulmonary nodule/mass, pleural effusion, or pneumothorax. No acute bony abnormalities are identified. IMPRESSION: No active disease. Electronically Signed   By: Margarette Canada M.D.   On: 04/28/2021 13:00   ECHOCARDIOGRAM COMPLETE  Result Date: 04/28/2021    ECHOCARDIOGRAM REPORT   Patient Name:   George Bradley Date of Exam: 04/28/2021 Medical Rec #:  [id number]     Height:       73.0 in Accession #:    [id number]    Weight:       175.0 lb Date of Birth:  [birth year]     BSA:          2.033 m Patient Age:    80 years      BP:           138/77 mmHg Patient Gender: M             HR:           78 bpm. Exam Location:  [facility] Procedure: 2D Echo, Cardiac Doppler and Color Doppler Indications:    Stroke l63.9  History:        Patient has prior history of Echocardiogram examinations, most                  recent 12/20/2020. CAD, Aortic Valve Disease; Risk                 Factors:Dyslipidemia.  Sonographer:    Alvino Chapel RCS Referring Phys: [id number] [facility]  Sonographer Comments: No subcostal window. IMPRESSIONS  1. Left ventricular ejection fraction, by estimation, is 50 to 55%. The left ventricle has low normal function. The left  ventricle has no regional wall motion abnormalities. There is moderate asymmetric left ventricular hypertrophy of the basal-septal segment. Left ventricular diastolic parameters are consistent with Grade I diastolic dysfunction (impaired relaxation). There is abnormal septal motion.  2. Right ventricular systolic function is mildly reduced. The right ventricular size is normal.  3. Left atrial size was mildly dilated.  4. The mitral valve is normal in structure. Trivial mitral valve regurgitation. No evidence of mitral stenosis.  5. The aortic valve is abnormal. There is severe calcifcation of the aortic valve. Aortic valve regurgitation is mild. Moderate aortic valve stenosis. Aortic valve mean gradient measures 21.0 mmHg. Conclusion(s)/Recommendation(s): No intracardiac source of embolism detected on this transthoracic study. FINDINGS  Left Ventricle: Left ventricular ejection fraction, by estimation, is 50 to 55%. The left ventricle has low normal function. The left ventricle has no regional wall motion abnormalities. The left ventricular internal cavity size was normal in size. There is moderate asymmetric left ventricular hypertrophy of the basal-septal segment. Abnormal septal motion. Left ventricular diastolic parameters are consistent with Grade I diastolic dysfunction (impaired relaxation). Right Ventricle: The right ventricular size is normal. No increase in right ventricular wall thickness. Right ventricular systolic function is mildly reduced. Left Atrium: Left atrial size was mildly dilated. Right Atrium: Right atrial size was normal in size. Pericardium: There  is no evidence of pericardial effusion. Mitral Valve: The mitral valve is normal in structure. Trivial mitral valve regurgitation. No evidence of mitral valve stenosis. Tricuspid Valve: The tricuspid valve is normal in structure. Tricuspid valve regurgitation is trivial. No evidence of tricuspid stenosis. Aortic Valve: The aortic valve is abnormal. There is severe calcifcation of the aortic valve. Aortic valve regurgitation is mild. Moderate aortic stenosis is present. Aortic valve mean gradient measures 21.0 mmHg. Aortic valve peak gradient measures 34.8  mmHg. Aortic valve area, by VTI measures 1.07 cm. Pulmonic Valve: The pulmonic valve was normal in structure. Pulmonic valve regurgitation is trivial. No evidence of pulmonic stenosis. Aorta: The aortic root is normal in size and structure. There is mild dilatation of the aortic arch, measuring 36 mm. Venous: The inferior vena cava was not well visualized. IAS/Shunts: The interatrial septum was not well visualized.  LEFT VENTRICLE PLAX 2D LVIDd:         4.60 cm  Diastology LVIDs:         3.45 cm  LV e' medial:    4.13 cm/s LV PW:         1.00 cm  LV E/e' medial:  18.0 LV IVS:        1.40 cm  LV e' lateral:   5.22 cm/s LVOT diam:     2.10 cm  LV E/e' lateral: 14.2 LV SV:         65 LV SV Index:   32 LVOT Area:     3.46 cm  RIGHT VENTRICLE RV S prime:     8.70 cm/s TAPSE (M-mode): 1.4 cm LEFT ATRIUM             Index       RIGHT ATRIUM           Index LA diam:        3.90 cm 1.92 cm/m  RA Area:     18.10 cm LA Vol (A2C):   78.0 ml 38.37 ml/m RA Volume:   43.10 ml  21.20 ml/m LA Vol (A4C):   59.3 ml 29.17 ml/m LA Biplane Vol: 69.1 ml 33.99 ml/m  AORTIC VALVE AV  Area (Vmax):    1.10 cm AV Area (Vmean):   1.04 cm AV Area (VTI):     1.07 cm AV Vmax:           295.00 cm/s AV Vmean:          205.667 cm/s AV VTI:            0.606 m AV Peak Grad:      34.8 mmHg AV Mean Grad:      21.0 mmHg LVOT Vmax:         93.35 cm/s LVOT Vmean:        61.850 cm/s LVOT VTI:           0.188 m LVOT/AV VTI ratio: 0.31  AORTA Ao Root diam: 3.50 cm MITRAL VALVE                TRICUSPID VALVE MV Area (PHT): 2.10 cm     TR Peak grad:   24.2 mmHg MV Decel Time: 362 msec     TR Vmax:        246.00 cm/s MV E velocity: 74.15 cm/s MV A velocity: 107.20 cm/s  SHUNTS MV E/A ratio:  0.69         Systemic VTI:  0.19 m                             Systemic Diam: 2.10 cm Cherlynn Kaiser MD Electronically signed by Cherlynn Kaiser MD Signature Date/Time: 04/28/2021/12:53:28 PM    Final     PHYSICAL EXAM Physical Exam  Constitutional: Appears well-developed and well-nourished.  Psych: Affect appropriate to situation Eyes: Normal external eye and conjunctiva. HENT: Normocephalic, no lesions, without obvious abnormality.   Musculoskeletal-no joint tenderness, deformity or swelling Cardiovascular: Normal rate and regular rhythm.  Respiratory: Effort normal, non-labored breathing saturations WNL GI: Soft.  No distension. There is no tenderness.  Skin: WDI   Neuro:  Mental Status: Alert, oriented,age/name/ month. He did miss the year. thought content appropriate.  Speech fluent without evidence of aphasia.  Able to follow  commands without difficulty. Cranial Nerves: II:  Visual fields grossly normal,  III,IV, VI: ptosis not present, extra-ocular motions intact bilaterally pupils equal, round, reactive to light and accommodation V,VII: smile symmetric, facial light touch sensation normal bilaterally VIII: hearing normal bilaterally IX,X: uvula rises symmetrically XI: bilateral shoulder shrug XII: midline tongue extension Motor: Right : Upper extremity   5/5  Left:     Upper extremity   5/5  Lower extremity   5/5   Lower extremity   5/5 Tone and bulk:normal tone throughout; no atrophy noted Sensory:  light touch intact throughout, bilaterally Cerebellar: normal finger-to-nose,  Gait: deferred     ASSESSMENT/PLAN Mr. AMBROCIO SANSEVERINO is a 80 y.o. male male with a history of CAD s/p  CABG in 2000, orthostatic hypotension with persistent lightheadedness, hyperlipidemia, oral cancer with radiation who presented to [facility] ED after falling at home from ble weakness. He reports he went to sleep in his usual state of health around 10pm last night. He woke up around 6 am and felt significant bilatearl leg weakness, which caused him to fall. He was able to get to a phone and call his daughter, who summed EMS for help.  EMS reported focal weakness involving the left upper extremity. Upon arrival to the ED, there was no focal weakness found by ED exam; pt had gneralized weakness.  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  CT Head: no acute infarct CTA head & neck 1. New right carotid occlusion with supraclinoid reconstitution from small communicating arteries.2. No acute infarct by noncontrast head CT. MRI  Abnormal flow in the right internal carotid artery consistent with the CT a findings of right ICA occlusion. Multiple punctate acute infarctions affecting the cortical brain of the right hemisphere, probably watershed distribution. Micro embolic infarctions are possible but less likely. No large or confluent infarction. No mass effect or hemorrhage.Old small left occipital and right frontal cortical infarctions. Minimal small vessel change of the white matter 2D Echo   1. Left ventricular ejection fraction, by estimation, is 50 to 55%. The  left ventricle has low normal function. The left ventricle has no regional  wall motion abnormalities. There is moderate asymmetric left ventricular  hypertrophy of the basal-septal  segment. Left ventricular diastolic parameters are consistent with Grade I  diastolic dysfunction (impaired relaxation). There is abnormal septal  motion.   2. Right ventricular systolic function is mildly reduced. The right  ventricular size is normal.   3. Left atrial size was mildly dilated.   4. The mitral valve is normal in  structure. Trivial mitral valve  regurgitation. No evidence of mitral stenosis.   5. The aortic valve is abnormal. There is severe calcifcation of the  aortic valve. Aortic valve regurgitation is mild. Moderate aortic valve  stenosis. Aortic valve mean gradient measures 21.0 mmHg. LDL 20 HgbA1c 6.0 VTE prophylaxis - SCD's    Diet   Diet NPO time specified   aspirin 81 mg daily and Brilinta (ticagrelor) 90 mg bid prior to admission, now on aspirin 81 mg daily and Brilinta (ticagrelor) 90 mg bid.  Therapy recommendations:  none from OT, PT: rolling walker and outpatient PT Disposition:  home  Hypertension Home meds:  norvasc Stable Permissive hypertension (OK if < 220/120) but gradually normalize in 5-7 days Long-term BP goal normotensive  Hyperlipidemia Home meds:  crestor 20 mg daily, resumed in hospital LDL 20, goal < 70 Continue statin at discharge  Diabetes type II (no diagnosis) Home meds:  none HgbA1c 6.0, goal < 7.0 CBGs Recent Labs    04/27/21 0937  GLUCAP 117*     Other Stroke Risk Factors Advanced Age >/= 17  Coronary artery disease History of CVA  Hospital day # [place], MSN, NP-C [facility] [phone]   To contact Stroke Continuity provider, please refer to [email or web address] After hours, contact General Neurology

## 2021-04-29 NOTE — Progress Notes (Signed)
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  Patient has been taking aspirin and Brilinta after stent placement.  Family reports that he had recovered from neurodeficits from stroke and was doing fairly well.  Repeat carotid ultrasound 03/28/2021 showed patent right carotid artery.  Brilinta was discontinued at that time and is continued on aspirin.  He presents with transient slurred speech, left upper extremity weakness.  CT angiogram of head and neck did not show any acute stroke, but did comment on occluded right carotid artery.  He was transferred to [facility] for further stroke work-up including MRI brain. Patient being  evaluated by neuro interventional radiology for possible angiogram.  Assessment & Plan:   Active Problems:   Hypercholesterolemia   Orthostatic hypotension   Hypothyroidism   Dysphagia   MGUS (monoclonal gammopathy of unknown significance)   Small fiber polyneuropathy   Dementia (HCC)   Left-sided weakness   Right carotid artery occlusion   Weakness   Acute left upper extremity weakness/slurred speech: Concern for another CVA.  Pending MRI of the brain.  CT angiogram of the head and neck have been performed LDL of 20, on a statin.  HbA1C of 6. Echo showed EF of 50 to [id number], grade 1 diastolic dysfunction, no intracardiac source of embolism. PT recommended outpatient PT.  OT evaluation pending. Speech evaluated the patient and started dysphagia 3 diet. Currently on aspirin on aspirin and Brilinta.  Neurology following.  Right carotid occlusion: Patient being  evaluated by neuro interventional radiology for possible angiogram.  Continue aspirin, Brilinta .CT of  the neck showed occluded right carotid artery.  Hypertension/orthostatic hypotension: Allow permissive hypertension.  Chronically on fludrocortisone, low-dose amlodipine.  Monitor blood pressure  Hypothyroidism: Takes levothyroxine at home  Dementia: Mild.  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  Radiation therapy could have contributed to dysphagia.  Currently in remission.  History of dysphagia: On dysphagia 3 diet.           DVT prophylaxis:Heparin [place] Code Status: DNR Family Communication: Wife at bedside Status is: Inpatient  Remains inpatient appropriate because:Inpatient level of care appropriate due to severity of illness  Dispo: The patient is from: Home              Anticipated d/c is to: Home              Patient currently is not medically stable to d/c.   Difficult to place patient No    Consultants: Neurology,IR  Procedures:Plan for cerebral angiogram  Antimicrobials:  Anti-infectives (From admission, onward)    None       Subjective:  Patient seen and examined at the bedside this morning.  Hemodynamically stable.  Wife at the bedside.  His speech is more clear and as per the wife it is at baseline.  Weakness on the left side has improved.  Alert and oriented.  Denies any complaints   Objective: Vitals:   04/28/21 1339 04/28/21 1428 04/28/21 2011 04/29/21 0417  BP: 132/77 138/82 (!) 141/100 113/62  Pulse: 75 74 100  72  Resp: '17 18 20   '$ Temp: 98.3 F (36.8 C) 98.4 F (36.9 C) 98.4 F (36.9 C) 98.1 F (36.7 C)  TempSrc: Oral Oral  Oral  SpO2: 96% 97% 97% 94%  Weight:      Height:        Intake/Output Summary (Last 24 hours) at 04/29/2021 0846 Last data filed at 04/29/2021 0600 Gross per 24 hour  Intake 945 ml  Output 1150 ml  Net -205 ml   Filed  Weights   04/27/21 0935  Weight: 79.4 kg    Examination:  General exam: Overall comfortable, not in distress HEENT: PERRL Respiratory system:  no wheezes or crackles  Cardiovascular system: S1 & S2 heard, RRR.  Gastrointestinal system: Abdomen is nondistended, soft and nontender. Central nervous system: Alert and oriented Motor: RUE/LUE: 5/5 RLE:5/5 LLE:4+/5 Extremities: No edema, no clubbing ,no cyanosis Skin: No rashes, no ulcers,no icterus       Data Reviewed: I have personally reviewed following labs and imaging studies  CBC: Recent Labs  Lab 04/27/21 0950 04/27/21 2215 04/29/21 0444  WBC 9.1 17.7* 7.5  NEUTROABS 7.2  --   --   HGB 14.7 14.3 13.3  HCT 45.9 43.3 40.1  MCV 98.9 97.7 94.4  PLT 143* 149* 123XX123*   Basic Metabolic Panel: Recent Labs  Lab 04/27/21 0950 04/27/21 2215 04/29/21 0444  NA 138  --  140  K 4.0  --  3.4*  CL 102  --  104  CO2 29  --  29  GLUCOSE 124*  --  95  BUN 19  --  14  CREATININE 1.52* 1.13 1.09  CALCIUM 9.3  --  9.1   GFR: Estimated Creatinine Clearance: 60.7 mL/min (by C-G formula based on SCr of 1.09 mg/dL). Liver Function Tests: Recent Labs  Lab 04/27/21 0950  AST 40  ALT 21  ALKPHOS 40  BILITOT 0.5  PROT 8.7*  ALBUMIN 4.4   No results for input(s): LIPASE, AMYLASE in the last 168 hours. No results for input(s): AMMONIA in the last 168 hours. Coagulation Profile: Recent Labs  Lab 04/27/21 0950  INR 1.0   Cardiac Enzymes: No results for input(s): CKTOTAL, CKMB, CKMBINDEX, TROPONINI in the last 168 hours. BNP (last 3 results) No results for input(s): PROBNP in the last 8760 hours. HbA1C: Recent Labs    04/28/21 0537  HGBA1C 6.0*   CBG: Recent Labs  Lab 04/27/21 0937  GLUCAP 117*   Lipid Profile: Recent Labs    04/28/21 0537  CHOL 74  HDL 45  LDLCALC 20  TRIG 47  CHOLHDL 1.6   Thyroid Function Tests: No results for input(s): TSH, T4TOTAL, FREET4, T3FREE, THYROIDAB in the last 72  hours. Anemia Panel: No results for input(s): VITAMINB12, FOLATE, FERRITIN, TIBC, IRON, RETICCTPCT in the last 72 hours. Sepsis Labs: No results for input(s): PROCALCITON, LATICACIDVEN in the last 168 hours.  Recent Results (from the past 240 hour(s))  Resp Panel by RT-PCR (Flu A&B, Covid) Nasopharyngeal Swab     Status: None   Collection Time: 04/27/21  9:42 AM   Specimen: Nasopharyngeal Swab; Nasopharyngeal(NP) swabs in vial transport medium  Result Value Ref Range Status   SARS Coronavirus 2 by RT PCR NEGATIVE NEGATIVE Final    Comment: (NOTE) SARS-CoV-2 target nucleic acids are NOT DETECTED.  The SARS-CoV-2 RNA is generally detectable in upper respiratory specimens during the acute phase of infection. The lowest concentration of SARS-CoV-2 viral copies this assay can detect is 138 copies/mL.  A negative result does not preclude SARS-Cov-2 infection and should not be used as the sole basis for treatment or other patient management decisions. A negative result may occur with  improper specimen collection/handling, submission of specimen other than nasopharyngeal swab, presence of viral mutation(s) within the areas targeted by this assay, and inadequate number of viral copies(<138 copies/mL). A negative result must be combined with clinical observations, patient history, and epidemiological information. The expected result is Negative.  Fact Sheet for Patients:  EntrepreneurPulse.com.au  Fact Sheet for Healthcare Providers:  IncredibleEmployment.be  This test is no t yet approved or cleared by the [place] FDA and  has been authorized for detection and/or diagnosis of SARS-CoV-2 by FDA under an Emergency Use Authorization (EUA). This EUA will remain  in effect (meaning this test can be used) for the duration of the COVID-19 declaration under Section 564(b)(1) of the Act, 21 U.S.C.section 360bbb-3(b)(1), unless the authorization is  terminated  or revoked sooner.       Influenza A by PCR NEGATIVE NEGATIVE Final   Influenza B by PCR NEGATIVE NEGATIVE Final    Comment: (NOTE) The Xpert Xpress SARS-CoV-2/FLU/RSV plus assay is intended as an aid in the diagnosis of influenza from Nasopharyngeal swab specimens and should not be used as a sole basis for treatment. Nasal washings and aspirates are unacceptable for Xpert Xpress SARS-CoV-2/FLU/RSV testing.  Fact Sheet for Patients: EntrepreneurPulse.com.au  Fact Sheet for Healthcare Providers: IncredibleEmployment.be  This test is not yet approved or cleared by the [place] FDA and has been authorized for detection and/or diagnosis of SARS-CoV-2 by FDA under an Emergency Use Authorization (EUA). This EUA will remain in effect (meaning this test can be used) for the duration of the COVID-19 declaration under Section 564(b)(1) of the Act, 21 U.S.C. section 360bbb-3(b)(1), unless the authorization is terminated or revoked.  Performed at [facility], [address], [place], [place] [address]          Radiology Studies: CT Angio Head W or Wo Contrast  Result Date: 04/27/2021 CLINICAL DATA:  Increased left-sided weakness.  History of stroke EXAM: CT ANGIOGRAPHY HEAD AND NECK TECHNIQUE: Multidetector CT imaging of the head and neck was performed using the standard protocol during bolus administration of intravenous contrast. Multiplanar CT image reconstructions and MIPs were obtained to evaluate the vascular anatomy. Carotid stenosis measurements (when applicable) are obtained utilizing NASCET criteria, using the distal internal carotid diameter as the denominator. CONTRAST:  29m OMNIPAQUE IOHEXOL 350 MG/ML SOLN COMPARISON:  Brain MRI 01/06/2021 FINDINGS: CT HEAD FINDINGS Brain: Small remote left occipital cortex infarct. No evidence of acute or interval infarct. No hemorrhage, hydrocephalus, or masslike finding Vascular:  Hyperdense right ICA at the skull base. Skull: Negative Sinuses: Clear Orbits: Bilateral cataract resection Review of the MIP images confirms the above findings CTA NECK FINDINGS Aortic arch: Atheromatous plaque.  No acute finding. Right carotid system: Interval occlusion of the right common carotid and ICA with reconstituted ECA branches showing clot at the mainstem. No vessel collapse. There has been prior right-sided carotid stenting. Left carotid system: Scattered atheromatous calcification of the carotid and proximal ICA without flow limiting stenosis or ulceration Vertebral arteries: Proximal subclavian atherosclerosis without flow limiting stenosis. Calcified plaque at both vertebral ostia with mild to moderate narrowing. The left vertebral artery is dominant. Skeleton: No acute finding Other neck: Sequela of radiotherapy.  No mass or adenopathy. Upper chest: Biapical pleural based scarring from radiotherapy. Review of the MIP images confirms the above findings CTA  HEAD FINDINGS Anterior circulation: Right ICA occlusion with reconstitution at the supraclinoid level. Tiny communicating arteries are present anteriorly and posteriorly. No downstream branch occlusion is seen. Negative for aneurysm Posterior circulation: The vertebral and basilar arteries are smoothly contoured and widely patent. No branch occlusion, beading, or aneurysm Venous sinuses: Patent. Anatomic variants: None significant Review of the MIP images confirms the above findings Critical Value/emergent results were called by telephone at the time of interpretation on 04/27/2021 at 10:56 am to provider [facility] , who verbally acknowledged these results. IMPRESSION: 1. New right carotid occlusion with supraclinoid reconstitution from small communicating arteries. 2. No acute infarct by noncontrast head CT. Electronically Signed   By: Monte Fantasia M.D.   On: 04/27/2021 10:58   DG Abd 1 View  Result Date: 04/29/2021 CLINICAL DATA:   Abdominal pain, screen for metal, patient is scheduled to have an MRI EXAM: ABDOMEN - 1 VIEW COMPARISON:  CT abdomen/pelvis 12/26/2020 FINDINGS: There is a non obstructive bowel gas pattern. Left upper quadrant surgical clips are noted. There is a curvilinear metallic density projecting over the medial right upper abdomen of uncertain etiology, not present on the CT abdomen/pelvis of 12/26/2020. Median sternotomy wires are noted. The soft tissues are unremarkable. There is no acute osseous abnormality. IMPRESSION: Curvilinear metallic density projecting over the right upper abdomen is of uncertain etiology, but may reflect a surgical facemask. Otherwise, left upper quadrant surgical clips and median sternotomy wires are present. Electronically Signed   By: Valetta Mole MD   On: 04/29/2021 08:22   CT Angio Neck W and/or Wo Contrast  Result Date: 04/27/2021 CLINICAL DATA:  Increased left-sided weakness.  History of stroke EXAM: CT ANGIOGRAPHY HEAD AND NECK TECHNIQUE: Multidetector CT imaging of the head and neck was performed using the standard protocol during bolus administration of intravenous contrast. Multiplanar CT image reconstructions and MIPs were obtained to evaluate the vascular anatomy. Carotid stenosis measurements (when applicable) are obtained utilizing NASCET criteria, using the distal internal carotid diameter as the denominator. CONTRAST:  57m OMNIPAQUE IOHEXOL 350 MG/ML SOLN COMPARISON:  Brain MRI 01/06/2021 FINDINGS: CT HEAD FINDINGS Brain: Small remote left occipital cortex infarct. No evidence of acute or interval infarct. No hemorrhage, hydrocephalus, or masslike finding Vascular: Hyperdense right ICA at the skull base. Skull: Negative Sinuses: Clear Orbits: Bilateral cataract resection Review of the MIP images confirms the above findings CTA NECK FINDINGS Aortic arch: Atheromatous plaque.  No acute finding. Right carotid system: Interval occlusion of the right common carotid and ICA with  reconstituted ECA branches showing clot at the mainstem. No vessel collapse. There has been prior right-sided carotid stenting. Left carotid system: Scattered atheromatous calcification of the carotid and proximal ICA without flow limiting stenosis or ulceration Vertebral arteries: Proximal subclavian atherosclerosis without flow limiting stenosis. Calcified plaque at both vertebral ostia with mild to moderate narrowing. The left vertebral artery is dominant. Skeleton: No acute finding Other neck: Sequela of radiotherapy.  No mass or adenopathy. Upper chest: Biapical pleural based scarring from radiotherapy. Review of the MIP images confirms the above findings CTA HEAD FINDINGS Anterior circulation: Right ICA occlusion with reconstitution at the supraclinoid level. Tiny communicating arteries are present anteriorly and posteriorly. No downstream branch occlusion is seen. Negative for aneurysm Posterior circulation: The vertebral and basilar arteries are smoothly contoured and widely patent. No branch occlusion, beading, or aneurysm Venous sinuses: Patent. Anatomic variants: None significant Review of the MIP images confirms the above findings Critical Value/emergent results were called by telephone  at the time of interpretation on 04/27/2021 at 10:56 am to provider [facility] , who verbally acknowledged these results. IMPRESSION: 1. New right carotid occlusion with supraclinoid reconstitution from small communicating arteries. 2. No acute infarct by noncontrast head CT. Electronically Signed   By: Monte Fantasia M.D.   On: 04/27/2021 10:58   DG CHEST PORT 1 VIEW  Result Date: 04/28/2021 CLINICAL DATA:  Leukocytosis EXAM: PORTABLE CHEST 1 VIEW COMPARISON:  01/21/2021 and prior studies FINDINGS: The cardiomediastinal silhouette is unremarkable. Median sternotomy again noted. There is no evidence of focal airspace disease, pulmonary edema, suspicious pulmonary nodule/mass, pleural effusion, or pneumothorax. No  acute bony abnormalities are identified. IMPRESSION: No active disease. Electronically Signed   By: Margarette Canada M.D.   On: 04/28/2021 13:00   ECHOCARDIOGRAM COMPLETE  Result Date: 04/28/2021    ECHOCARDIOGRAM REPORT   Patient Name:   George Bradley Date of Exam: 04/28/2021 Medical Rec #:  [id number]     Height:       73.0 in Accession #:    [id number]    Weight:       175.0 lb Date of Birth:  [birth year]     BSA:          2.033 m Patient Age:    33 years      BP:           138/77 mmHg Patient Gender: M             HR:           78 bpm. Exam Location:  [facility] Procedure: 2D Echo, Cardiac Doppler and Color Doppler Indications:    Stroke l63.9  History:        Patient has prior history of Echocardiogram examinations, most                 recent 12/20/2020. CAD, Aortic Valve Disease; Risk                 Factors:Dyslipidemia.  Sonographer:    Alvino Chapel RCS Referring Phys: [phone] [facility]  Sonographer Comments: No subcostal window. IMPRESSIONS  1. Left ventricular ejection fraction, by estimation, is 50 to 55%. The left ventricle has low normal function. The left ventricle has no regional wall motion abnormalities. There is moderate asymmetric left ventricular hypertrophy of the basal-septal segment. Left ventricular diastolic parameters are consistent with Grade I diastolic dysfunction (impaired relaxation). There is abnormal septal motion.  2. Right ventricular systolic function is mildly reduced. The right ventricular size is normal.  3. Left atrial size was mildly dilated.  4. The mitral valve is normal in structure. Trivial mitral valve regurgitation. No evidence of mitral stenosis.  5. The aortic valve is abnormal. There is severe calcifcation of the aortic valve. Aortic valve regurgitation is mild. Moderate aortic valve stenosis. Aortic valve mean gradient measures 21.0 mmHg. Conclusion(s)/Recommendation(s): No intracardiac source of embolism detected on this transthoracic study. FINDINGS  Left Ventricle:  Left ventricular ejection fraction, by estimation, is 50 to 55%. The left ventricle has low normal function. The left ventricle has no regional wall motion abnormalities. The left ventricular internal cavity size was normal in size. There is moderate asymmetric left ventricular hypertrophy of the basal-septal segment. Abnormal septal motion. Left ventricular diastolic parameters are consistent with Grade I diastolic dysfunction (impaired relaxation). Right Ventricle: The right ventricular size is normal. No increase in right ventricular wall thickness. Right ventricular systolic function is mildly reduced. Left Atrium: Left atrial size  was mildly dilated. Right Atrium: Right atrial size was normal in size. Pericardium: There is no evidence of pericardial effusion. Mitral Valve: The mitral valve is normal in structure. Trivial mitral valve regurgitation. No evidence of mitral valve stenosis. Tricuspid Valve: The tricuspid valve is normal in structure. Tricuspid valve regurgitation is trivial. No evidence of tricuspid stenosis. Aortic Valve: The aortic valve is abnormal. There is severe calcifcation of the aortic valve. Aortic valve regurgitation is mild. Moderate aortic stenosis is present. Aortic valve mean gradient measures 21.0 mmHg. Aortic valve peak gradient measures 34.8  mmHg. Aortic valve area, by VTI measures 1.07 cm. Pulmonic Valve: The pulmonic valve was normal in structure. Pulmonic valve regurgitation is trivial. No evidence of pulmonic stenosis. Aorta: The aortic root is normal in size and structure. There is mild dilatation of the aortic arch, measuring 36 mm. Venous: The inferior vena cava was not well visualized. IAS/Shunts: The interatrial septum was not well visualized.  LEFT VENTRICLE PLAX 2D LVIDd:         4.60 cm  Diastology LVIDs:         3.45 cm  LV e' medial:    4.13 cm/s LV PW:         1.00 cm  LV E/e' medial:  18.0 LV IVS:        1.40 cm  LV e' lateral:   5.22 cm/s LVOT diam:     2.10 cm   LV E/e' lateral: 14.2 LV SV:         65 LV SV Index:   32 LVOT Area:     3.46 cm  RIGHT VENTRICLE RV S prime:     8.70 cm/s TAPSE (M-mode): 1.4 cm LEFT ATRIUM             Index       RIGHT ATRIUM           Index LA diam:        3.90 cm 1.92 cm/m  RA Area:     18.10 cm LA Vol (A2C):   78.0 ml 38.37 ml/m RA Volume:   43.10 ml  21.20 ml/m LA Vol (A4C):   59.3 ml 29.17 ml/m LA Biplane Vol: 69.1 ml 33.99 ml/m  AORTIC VALVE AV Area (Vmax):    1.10 cm AV Area (Vmean):   1.04 cm AV Area (VTI):     1.07 cm AV Vmax:           295.00 cm/s AV Vmean:          205.667 cm/s AV VTI:            0.606 m AV Peak Grad:      34.8 mmHg AV Mean Grad:      21.0 mmHg LVOT Vmax:         93.35 cm/s LVOT Vmean:        61.850 cm/s LVOT VTI:          0.188 m LVOT/AV VTI ratio: 0.31  AORTA Ao Root diam: 3.50 cm MITRAL VALVE                TRICUSPID VALVE MV Area (PHT): 2.10 cm     TR Peak grad:   24.2 mmHg MV Decel Time: 362 msec     TR Vmax:        246.00 cm/s MV E velocity: 74.15 cm/s MV A velocity: 107.20 cm/s  SHUNTS MV E/A ratio:  0.69         Systemic  VTI:  0.19 m                             Systemic Diam: 2.10 cm Cherlynn Kaiser MD Electronically signed by Cherlynn Kaiser MD Signature Date/Time: 04/28/2021/12:53:28 PM    Final         Scheduled Meds:   stroke: mapping our early stages of recovery book   Does not apply Once   aspirin  81 mg Oral Daily   donepezil  5 mg Oral QHS   fludrocortisone  100 mcg Oral Daily   gabapentin  900 mg Oral TID   heparin  5,000 Units Subcutaneous Q8H   levothyroxine  100 mcg Oral QAC breakfast   pilocarpine  7.5 mg Oral TID   QUEtiapine  50 mg Oral QHS   rosuvastatin  20 mg Oral Daily   ticagrelor  90 mg Oral BID   Continuous Infusions:  lactated ringers 75 mL/hr at 04/29/21 0000   potassium chloride       LOS: 1 day    Time spent: 25 mins.More than 50% of that time was spent in counseling and/or coordination of care.      Shelly Coss, MD [facility] P8/04/2021, 8:46 AM

## 2021-04-29 NOTE — Progress Notes (Signed)
OT Cancellation Note  Patient Details Name: George Bradley MRN: [id number] DOB: 1941/08/14   Cancelled Treatment:    Reason Eval/Treat Not Completed: Patient at procedure or test/ unavailable. Off floor for x-ray. Plan for MRI and IR today. OT to check back as time allows.   Gloris Manchester OTR/L Supplemental OT, Department of rehab services [phone]  Diamante Rubin R H. 04/29/2021, 7:24 AM
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
## 2021-04-30 ENCOUNTER — Telehealth: Payer: Self-pay | Admitting: Internal Medicine

## 2021-04-30 LAB — BASIC METABOLIC PANEL
Anion gap: 7 (ref 5–15)
BUN: 13 mg/dL (ref 8–23)
CO2: 29 mmol/L (ref 22–32)
Calcium: 9.2 mg/dL (ref 8.9–10.3)
Chloride: 103 mmol/L (ref 98–111)
Creatinine, Ser: 1.03 mg/dL (ref 0.61–1.24)
GFR, Estimated: >60 mL/min (ref >60)
Glucose, Bld: 92 mg/dL (ref 70–99)
Potassium: 3.5 mmol/L (ref 3.5–5.1)
Sodium: 139 mmol/L (ref 135–145)

## 2021-04-30 MED ORDER — LORAZEPAM 1 MG PO TABS: 1.0000 mg | ORAL_TABLET | Freq: Three times a day (TID) | ORAL | 0 refills | Status: DC | PRN

## 2021-04-30 NOTE — TOC Transition Note (Signed)
Transition of Care [facility]) - CM/SW Discharge Note   Patient Details  Name: George Bradley MRN: [id number] Date of Birth: 07-17-1941  [facility]) CM/SW Contact:  Tom-Johnson, Renea Ee, RN Phone Number: 04/30/2021, 12:59 PM   Clinical Narrative:    Spoke with patient about outpatient rehab. Patient wants wife to choose facility. Spoke with wife via phone and she agreed with [facility] on [address]. Information on AVS for patient to call and schedule appointment once discharged. No further needs at this time.         Patient Goals and CMS Choice        Discharge Placement                       Discharge Plan and Services                                     Social Determinants of Health (SDOH) Interventions     Readmission Risk Interventions No flowsheet data found.

## 2021-04-30 NOTE — Evaluation (Signed)
Physical Therapy Evaluation Patient Details Name: George Bradley MRN: [id number] DOB: Jan 31, 1941 Today's Date: 04/30/2021   History of Present Illness  80 y.o. male presenting to [facility] ED 8/6 with slurred speech, LUE weakness and BLE weakness resulting in a fall while walking to the bathroom at home. Imaging suggestive of new RICA occlusion. Transferred to [facility] for further work-up. MRI pending. Plan for IR 8/8. 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   Clinical Impression  Patient received in bed, wife at bedside. Patient is pleasant and agreeable to PT assessment. He reports dizziness this morning while in bed. Orthostatics taken prior to mobility, results as follows: supine 128/70, seated 110/73, standing 116/79. Patient does not report dizziness when standing. He performed bed mobility and transfers with mod independence. Ambulated 200 feet without ad and min guard. Mild L LE weakness noted with ambulation and MMT. He will continue to benefit from skilled PT while here to improve mobility, and strength.        Follow Up Recommendations Outpatient PT    Equipment Recommendations       Recommendations for Other Services       Precautions / Restrictions Precautions Precautions: Fall Precaution Comments: watch BP, has history of orthostatis. Restrictions Weight Bearing Restrictions: No      Mobility  Bed Mobility Overal bed mobility: Modified Independent Bed Mobility: Supine to Sit;Sit to Supine     Supine to sit: Modified independent (Device/Increase time) Sit to supine: Modified independent (Device/Increase time)   General bed mobility comments: no physical assist given    Transfers Overall transfer level: Needs assistance Equipment used: None Transfers: Sit to/from Stand Sit to Stand: Supervision          General transfer comment: Supervision A for safety.  Ambulation/Gait Ambulation/Gait assistance: Supervision;Min guard Gait Distance (Feet): 200 Feet Assistive device: None Gait Pattern/deviations: Step-through pattern Gait velocity: WNL   General Gait Details: legs get shaky on occasion, but patient remained steady with min guard.  Stairs            Wheelchair Mobility    Modified Rankin (Stroke Patients Only) Modified Rankin (Stroke Patients Only) Pre-Morbid Rankin Score: Slight disability Modified Rankin: Moderate disability     Balance Overall balance assessment: Mild deficits observed, not formally tested Sitting-balance support: Feet supported Sitting balance-Leahy Scale: Normal     Standing balance support: No upper extremity supported;During functional activity Standing balance-Leahy Scale: Good Standing balance comment: no lob                             Pertinent Vitals/Pain Pain Assessment: No/denies pain    Home Living Family/patient expects to be discharged to:: Private residence Living Arrangements: Spouse/significant other Available Help at Discharge: Family;Available 24 hours/day Type of Home: House Home Access: Stairs to enter Entrance Stairs-Rails: Right Entrance Stairs-Number of Steps: 3 thru the garage Home Layout: Two level;Able to live on main level with bedroom/bathroom Home Equipment: Shower seat - built in;Hand held Tourist information centre manager - 2 wheels;Cane - single point;Cane - quad;Wheelchair - manual;Walker - 4 wheels;Grab bars - toilet;Grab bars - tub/shower Additional Comments: Wife is a retired acute PT    Prior Function Level of Independence: Independent         Comments: Performing ADLs, light IADLs- riding lawn mower, and driving. (Reports spouse is trying  to convince him not to drive.) States he will occasionally use SPC.     Hand Dominance   Dominant Hand: Right    Extremity/Trunk Assessment   Upper  Extremity Assessment Upper Extremity Assessment: Overall WFL for tasks assessed    Lower Extremity Assessment Lower Extremity Assessment: LLE deficits/detail LLE Deficits / Details: slightly weaker in L LE LLE Coordination: decreased gross motor    Cervical / Trunk Assessment Cervical / Trunk Assessment: Normal Cervical / Trunk Exceptions: Forward head  Communication   Communication: No difficulties  Cognition Arousal/Alertness: Awake/alert Behavior During Therapy: WFL for tasks assessed/performed Overall Cognitive Status: History of cognitive impairments - at baseline                                 General Comments: patient is very pleasant and cooperative, motivated to go home      General Comments General comments (skin integrity, edema, etc.): Wife present for assessment. Patient initially reporting dizziness when in bed, orthostatics taken: 128/70 supine, 110/73 seated, 116/79 standing. No reports of dizziness with mobility.    Exercises     Assessment/Plan    PT Assessment Patient needs continued PT services  PT Problem List Decreased strength;Decreased activity tolerance;Decreased mobility;Decreased cognition;Decreased coordination       PT Treatment Interventions Therapeutic exercise;Gait training;Balance training;Stair training;Functional mobility training;Therapeutic activities;Patient/family education    PT Goals (Current goals can be found in the Care Plan section)  Acute Rehab PT Goals Patient Stated Goal: Return home PT Goal Formulation: With patient/family Time For Goal Achievement: 05/04/21 Potential to Achieve Goals: Good    Frequency Min 3X/week   Barriers to discharge        Co-evaluation               AM-PAC PT "6 Clicks" Mobility  Outcome Measure Help needed turning from your back to your side while in a flat bed without using bedrails?: None Help needed moving from lying on your back to sitting on the side of a flat  bed without using bedrails?: None Help needed moving to and from a bed to a chair (including a wheelchair)?: A Little Help needed standing up from a chair using your arms (e.g., wheelchair or bedside chair)?: A Little Help needed to walk in hospital room?: A Little Help needed climbing 3-5 steps with a railing? : A Little 6 Click Score: 20    End of Session Equipment Utilized During Treatment: Gait belt Activity Tolerance: Patient tolerated treatment well Patient left: in bed;with call bell/phone within reach;with family/visitor present Nurse Communication: Mobility status PT Visit Diagnosis: Muscle weakness (generalized) (M62.81);Difficulty in walking, not elsewhere classified (R26.2);History of falling (Z91.81);Unsteadiness on feet (R26.81)    Time: [id number] PT Time Calculation (min) (ACUTE ONLY): 19 min   Charges:   PT Evaluation $PT Eval Moderate Complexity: 1 Mod PT Treatments $Gait Training: 8-22 mins        Nitesh Pitstick, PT, GCS 04/30/21,10:14 AM

## 2021-04-30 NOTE — Consult Note (Signed)
   [facility] Inpatient Consult   04/30/2021  George Bradley [birth year] [id number]  [facility] [ACO] Patient: Medicare CMS DCE  Primary Care Provider:  [name], MD  Patient is currently active with [facility] for chronic disease management services.  Patient has been engaged by a [facility].  Our community based plan of care has focused on disease management and community resource support.   Met with the patient at the bedside.  Patient states he is ready to go home.  He was disappointed that the process was not immediate.  Patient wanted to call his wife, and this Probation officer assisted with the phone call. Patient continues to want post hospital [facility] follow up however he states, "my wife deals with all of the good information." Patient will receive a post hospital call and will be evaluated for assessments and disease process education.    Plan:  Will update [facility] RN CM regarding home with OP rehab recommended.  Of note, [facility] Care Management services does not replace or interfere with any services that are needed or arranged by inpatient [facility] care management team.  For additional questions or referrals please contact:  Natividad Brood, RN BSN [facility] Liaison  [phone] business mobile phone Toll free office [phone]  Fax number: [phone] [email or web address]

## 2021-04-30 NOTE — Telephone Encounter (Signed)
   Telephone encounter was:  Successful.  04/30/2021 Name: KODE HEMMEN MRN: [id number] DOB: Jan 29, 1941  AVISHAI KAPLER is a 80 y.o. year old male who is a primary care patient of [name], MD . The community resource team was consulted for assistance with Caregiver Stress  Care guide performed the following interventions: Patient provided with information about care guide support team and interviewed to confirm resource needs Advised patients wife, [name], I will research caregiver support groups for dementia and email to [email or web address] .  Follow Up Plan:  No further follow up planned at this time. The patient has been provided with needed resources.  April Green Care Guide, Embedded Care Coordination [place], Care Management Phone: [phone] Email: [email or web address]

## 2021-04-30 NOTE — Discharge Summary (Signed)
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  Patient has been taking aspirin and Brilinta after stent placement.  Family reports that he had recovered from neurodeficits from stroke and was doing fairly well.  Repeat carotid ultrasound 03/28/2021 showed patent right carotid artery.  Brilinta was discontinued at that time and is continued on aspirin.  He presents with transient slurred speech, left upper extremity weakness.  CT angiogram of head and neck did not show any acute stroke, but did comment on occluded right carotid artery.  He was transferred to [facility] for further stroke work-up including MRI brain. Patient was  evaluated by neuro interventional radiology for possible angiogram but patient opted for conservative management.  Brilinta restarted.  He is medically stable for discharge home today.  Following problems were addressed during his hospitalization:  Acute left upper extremity weakness/slurred speech: MRI: Right internal carotid artery consistent with the CT a findings of right ICA occlusion. Multiple punctate acute infarctions affecting the cortical brain of the right hemisphere, probably watershed distribution LDL of 20, on a statin.  HbA1C of 6. Echo showed EF of 50 to [id number], grade 1 diastolic dysfunction, no intracardiac source of embolism. PT recommended outpatient PT.   Speech evaluated the patient and started dysphagia 3 diet. Currently on aspirin on aspirin and Brilinta.  Neurology following and signed off.  There was plan for cerebral  angiogram but patient opted for conservative management.  He needs to follow-up with neurology as an outpatient.  Neurology recommended to repeat CT head and neck in 1 to 2 months to evaluate for right ICA stent.   Right carotid occlusion: Patient was  evaluated by neuro interventional radiology for possible angiogram.  Continue aspirin, Brilinta .CT of the neck showed occluded right carotid artery.  Continue current management for now.  Plan is to repeat CT head and neck in 1 to 2 months to evaluate for right ICA stent.   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  Chronically on fludrocortisone.  Also on low-dose amlodipine for high blood pressure.  Continue this medication at home.  He should be careful on standing up from sitting/lying position   Hypothyroidism: Takes levothyroxine at home   Dementia: Mild.  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  Radiation therapy could have contributed to dysphagia.  Currently in remission.   History of dysphagia: On dysphagia 3 diet.  Discharge Diagnoses:  Active Problems:   Hypercholesterolemia   Orthostatic hypotension   Hypothyroidism   Dysphagia   MGUS (monoclonal gammopathy of unknown significance)   Small fiber polyneuropathy   Dementia (HCC)   Left-sided weakness   Right carotid artery occlusion   Weakness   Protein-calorie malnutrition, severe    Discharge Instructions  Discharge Instructions     Ambulatory referral to Neurology   Complete by: As directed    Follow up with Dr. Felecia Shelling at [facility] in 4 weeks.  Patient is Dr. Garth Bigness patient thanks.   Diet general   Complete by: As directed    Dysphagia 3 diet   Discharge instructions   Complete by: As  directed    1)Continue your medications as instructed 2)Follow up with neurology, Dr. Felecia Shelling as an outpatient   Increase activity slowly   Complete by: As directed    No wound care   Complete by: As directed       Allergies as of 04/30/2021       Reactions   Zetia [ezetimibe] Other (See Comments)   "made me feel bad"        Medication List     STOP taking these medications    QUEtiapine 50 MG tablet Commonly known as: SEROQUEL       TAKE these medications    amLODipine 5 MG tablet Commonly known as: NORVASC Take 0.5 tablets (2.5 mg total) by mouth daily as needed (blood pressure on the higher end, no parameter).   aspirin 81 MG EC tablet Take 1 tablet (81 mg total) by mouth daily. Swallow whole.   Brilinta 90 MG Tabs tablet Generic drug: ticagrelor Take 90 mg by mouth 2 (two) times daily.   busPIRone 5 MG tablet Commonly known as: BUSPAR Take 5 mg by mouth 2 (two) times daily as needed (anxiety).   cetirizine 10 MG tablet Commonly known as: ZYRTEC Take 10 mg by mouth daily.   donepezil 5 MG tablet Commonly known as: ARICEPT Take 5 mg by mouth at bedtime.   fludrocortisone 0.1 MG tablet Commonly known as: FLORINEF Take 100 mcg by mouth daily.   gabapentin 300 MG capsule Commonly known as: NEURONTIN TAKE 3 CAPSULES(900 MG) BY MOUTH THREE TIMES DAILY   indomethacin 25 MG capsule Commonly known as: INDOCIN Take 25 mg by mouth 2 (two) times daily.   levothyroxine 100 MCG tablet Commonly known as: SYNTHROID TAKE ONE TABLET BY MOUTH ONCE DAILY BEFORE  BREAKFAST What changed: See the new instructions.   LORazepam 1 MG tablet Commonly known as: ATIVAN Take 1 tablet (1 mg total) by mouth every 8 (eight) hours as needed for anxiety. What changed:  when to take this reasons to take this   pilocarpine 7.5 MG tablet Commonly known as: SALAGEN Take 7.5 mg by mouth 3 (three) times daily.   Repatha SureClick [id number] MG/ML Soaj Generic drug:  Evolocumab Inject 140 mg into the skin every 14 (fourteen) days.   rosuvastatin 20 MG tablet Commonly known as: CRESTOR Take 1 tablet (20 mg total) by mouth daily.   traMADol 50 MG tablet Commonly known as: ULTRAM Take 1 tablet (50 mg total) by mouth 2 (two) times daily as needed for moderate pain. What changed:  when to take this additional instructions        Follow-up Information     Sater, Nanine Means, MD. Schedule an appointment as soon as possible for a visit in 1 month(s).   Specialty: Neurology Contact information: [place] [address] [phone]         [name], MD. Schedule an appointment as soon as possible for a visit in 2 week(s).   Specialty: Internal Medicine Contact information: [address] [place] [address] [phone]                Allergies  Allergen Reactions   Zetia [Ezetimibe] Other (See Comments)    "made me feel bad"    Consultations: Neurology, neuroradiology   Procedures/Studies: CT Angio Head  W or Wo Contrast  Result Date: 04/27/2021 CLINICAL DATA:  Increased left-sided weakness.  History of stroke EXAM: CT ANGIOGRAPHY HEAD AND NECK TECHNIQUE: Multidetector CT imaging of the head and neck was performed using the standard protocol during bolus administration of intravenous contrast. Multiplanar CT image reconstructions and MIPs were obtained to evaluate the vascular anatomy. Carotid stenosis measurements (when applicable) are obtained utilizing NASCET criteria, using the distal internal carotid diameter as the denominator. CONTRAST:  9m OMNIPAQUE IOHEXOL 350 MG/ML SOLN COMPARISON:  Brain MRI 01/06/2021 FINDINGS: CT HEAD FINDINGS Brain: Small remote left occipital cortex infarct. No evidence of acute or interval infarct. No hemorrhage, hydrocephalus, or masslike finding Vascular: Hyperdense right ICA at the skull base. Skull: Negative Sinuses: Clear Orbits: Bilateral cataract resection Review of the MIP  images confirms the above findings CTA NECK FINDINGS Aortic arch: Atheromatous plaque.  No acute finding. Right carotid system: Interval occlusion of the right common carotid and ICA with reconstituted ECA branches showing clot at the mainstem. No vessel collapse. There has been prior right-sided carotid stenting. Left carotid system: Scattered atheromatous calcification of the carotid and proximal ICA without flow limiting stenosis or ulceration Vertebral arteries: Proximal subclavian atherosclerosis without flow limiting stenosis. Calcified plaque at both vertebral ostia with mild to moderate narrowing. The left vertebral artery is dominant. Skeleton: No acute finding Other neck: Sequela of radiotherapy.  No mass or adenopathy. Upper chest: Biapical pleural based scarring from radiotherapy. Review of the MIP images confirms the above findings CTA HEAD FINDINGS Anterior circulation: Right ICA occlusion with reconstitution at the supraclinoid level. Tiny communicating arteries are present anteriorly and posteriorly. No downstream branch occlusion is seen. Negative for aneurysm Posterior circulation: The vertebral and basilar arteries are smoothly contoured and widely patent. No branch occlusion, beading, or aneurysm Venous sinuses: Patent. Anatomic variants: None significant Review of the MIP images confirms the above findings Critical Value/emergent results were called by telephone at the time of interpretation on 04/27/2021 at 10:56 am to [facility], who verbally acknowledged these results. IMPRESSION: 1. New right carotid occlusion with supraclinoid reconstitution from small communicating arteries. 2. No acute infarct by noncontrast head CT. Electronically Signed   By: JMonte FantasiaM.D.   On: 04/27/2021 10:58   DG Abd 1 View  Result Date: 04/29/2021 CLINICAL DATA:  Abdominal pain, screen for metal, patient is scheduled to have an MRI EXAM: ABDOMEN - 1 VIEW COMPARISON:  CT abdomen/pelvis 12/26/2020  FINDINGS: There is a non obstructive bowel gas pattern. Left upper quadrant surgical clips are noted. There is a curvilinear metallic density projecting over the medial right upper abdomen of uncertain etiology, not present on the CT abdomen/pelvis of 12/26/2020. Median sternotomy wires are noted. The soft tissues are unremarkable. There is no acute osseous abnormality. IMPRESSION: Curvilinear metallic density projecting over the right upper abdomen is of uncertain etiology, but may reflect a surgical facemask. Otherwise, left upper quadrant surgical clips and median sternotomy wires are present. Electronically Signed   By: PValetta MoleMD   On: 04/29/2021 08:22   CT Angio Neck W and/or Wo Contrast  Result Date: 04/27/2021 CLINICAL DATA:  Increased left-sided weakness.  History of stroke EXAM: CT ANGIOGRAPHY HEAD AND NECK TECHNIQUE: Multidetector CT imaging of the head and neck was performed using the standard protocol during bolus administration of intravenous contrast. Multiplanar CT image reconstructions and MIPs were obtained to evaluate the vascular anatomy. Carotid stenosis measurements (when applicable) are obtained utilizing NASCET criteria, using the distal internal  carotid diameter as the denominator. CONTRAST:  56m OMNIPAQUE IOHEXOL 350 MG/ML SOLN COMPARISON:  Brain MRI 01/06/2021 FINDINGS: CT HEAD FINDINGS Brain: Small remote left occipital cortex infarct. No evidence of acute or interval infarct. No hemorrhage, hydrocephalus, or masslike finding Vascular: Hyperdense right ICA at the skull base. Skull: Negative Sinuses: Clear Orbits: Bilateral cataract resection Review of the MIP images confirms the above findings CTA NECK FINDINGS Aortic arch: Atheromatous plaque.  No acute finding. Right carotid system: Interval occlusion of the right common carotid and ICA with reconstituted ECA branches showing clot at the mainstem. No vessel collapse. There has been prior right-sided carotid stenting. Left  carotid system: Scattered atheromatous calcification of the carotid and proximal ICA without flow limiting stenosis or ulceration Vertebral arteries: Proximal subclavian atherosclerosis without flow limiting stenosis. Calcified plaque at both vertebral ostia with mild to moderate narrowing. The left vertebral artery is dominant. Skeleton: No acute finding Other neck: Sequela of radiotherapy.  No mass or adenopathy. Upper chest: Biapical pleural based scarring from radiotherapy. Review of the MIP images confirms the above findings CTA HEAD FINDINGS Anterior circulation: Right ICA occlusion with reconstitution at the supraclinoid level. Tiny communicating arteries are present anteriorly and posteriorly. No downstream branch occlusion is seen. Negative for aneurysm Posterior circulation: The vertebral and basilar arteries are smoothly contoured and widely patent. No branch occlusion, beading, or aneurysm Venous sinuses: Patent. Anatomic variants: None significant Review of the MIP images confirms the above findings Critical Value/emergent results were called by telephone at the time of interpretation on 04/27/2021 at 10:56 am to [facility], who verbally acknowledged these results. IMPRESSION: 1. New right carotid occlusion with supraclinoid reconstitution from small communicating arteries. 2. No acute infarct by noncontrast head CT. Electronically Signed   By: JMonte FantasiaM.D.   On: 04/27/2021 10:58   MR BRAIN WO CONTRAST  Result Date: 04/29/2021 CLINICAL DATA:  Neurological deficit, acute, stroke suspected. Right carotid occlusion by CT a. EXAM: MRI HEAD WITHOUT CONTRAST TECHNIQUE: Multiplanar, multiecho pulse sequences of the brain and surrounding structures were obtained without intravenous contrast. COMPARISON:  CT studies done 2 days ago. FINDINGS: Brain: Diffusion imaging shows scattered punctate acute infarctions at the cortex of the right hemisphere in the posterior temporal, parietal and  frontal region. Distribution suggests watershed infarctions, though micro embolic infarctions are possible. No large or confluent infarction. No swelling or hemorrhage. The brainstem and cerebellum are normal. There is an old left occipital cortical and subcortical infarction. There is an old right frontal cortical infarction. Minimal small vessel change affects the hemispheric white matter. No sign of mass, hemorrhage, hydrocephalus or extra-axial collection. Vascular: Abnormal flow in the right internal carotid artery. Other major vessels at the base of the brain show normal flow signal. Skull and upper cervical spine: Negative Sinuses/Orbits: Clear sinuses.  Previous bilateral lens implants. Other: None IMPRESSION: Abnormal flow in the right internal carotid artery consistent with the CT a findings of right ICA occlusion. Multiple punctate acute infarctions affecting the cortical brain of the right hemisphere, probably watershed distribution. Micro embolic infarctions are possible but less likely. No large or confluent infarction. No mass effect or hemorrhage. Old small left occipital and right frontal cortical infarctions. Minimal small vessel change of the white matter Electronically Signed   By: MNelson ChimesM.D.   On: 04/29/2021 09:08   DG CHEST PORT 1 VIEW  Result Date: 04/28/2021 CLINICAL DATA:  Leukocytosis EXAM: PORTABLE CHEST 1 VIEW COMPARISON:  01/21/2021 and prior studies FINDINGS:  The cardiomediastinal silhouette is unremarkable. Median sternotomy again noted. There is no evidence of focal airspace disease, pulmonary edema, suspicious pulmonary nodule/mass, pleural effusion, or pneumothorax. No acute bony abnormalities are identified. IMPRESSION: No active disease. Electronically Signed   By: Margarette Canada M.D.   On: 04/28/2021 13:00   ECHOCARDIOGRAM COMPLETE  Result Date: 04/28/2021    ECHOCARDIOGRAM REPORT   Patient Name:   JEWLIAN LECONTE Date of Exam: 04/28/2021 Medical Rec #:  [id number]      Height:       73.0 in Accession #:    [id number]    Weight:       175.0 lb Date of Birth:  March 26, 1941     BSA:          2.033 m Patient Age:    55 years      BP:           138/77 mmHg Patient Gender: M             HR:           78 bpm. Exam Location:  [facility] Procedure: 2D Echo, Cardiac Doppler and Color Doppler Indications:    Stroke l63.9  History:        Patient has prior history of Echocardiogram examinations, most                 recent 12/20/2020. CAD, Aortic Valve Disease; Risk                 Factors:Dyslipidemia.  Sonographer:    Alvino Chapel RCS Referring Phys: [phone] [facility]  Sonographer Comments: No subcostal window. IMPRESSIONS  1. Left ventricular ejection fraction, by estimation, is 50 to 55%. The left ventricle has low normal function. The left ventricle has no regional wall motion abnormalities. There is moderate asymmetric left ventricular hypertrophy of the basal-septal segment. Left ventricular diastolic parameters are consistent with Grade I diastolic dysfunction (impaired relaxation). There is abnormal septal motion.  2. Right ventricular systolic function is mildly reduced. The right ventricular size is normal.  3. Left atrial size was mildly dilated.  4. The mitral valve is normal in structure. Trivial mitral valve regurgitation. No evidence of mitral stenosis.  5. The aortic valve is abnormal. There is severe calcifcation of the aortic valve. Aortic valve regurgitation is mild. Moderate aortic valve stenosis. Aortic valve mean gradient measures 21.0 mmHg. Conclusion(s)/Recommendation(s): No intracardiac source of embolism detected on this transthoracic study. FINDINGS  Left Ventricle: Left ventricular ejection fraction, by estimation, is 50 to 55%. The left ventricle has low normal function. The left ventricle has no regional wall motion abnormalities. The left ventricular internal cavity size was normal in size. There is moderate asymmetric left ventricular hypertrophy of the  basal-septal segment. Abnormal septal motion. Left ventricular diastolic parameters are consistent with Grade I diastolic dysfunction (impaired relaxation). Right Ventricle: The right ventricular size is normal. No increase in right ventricular wall thickness. Right ventricular systolic function is mildly reduced. Left Atrium: Left atrial size was mildly dilated. Right Atrium: Right atrial size was normal in size. Pericardium: There is no evidence of pericardial effusion. Mitral Valve: The mitral valve is normal in structure. Trivial mitral valve regurgitation. No evidence of mitral valve stenosis. Tricuspid Valve: The tricuspid valve is normal in structure. Tricuspid valve regurgitation is trivial. No evidence of tricuspid stenosis. Aortic Valve: The aortic valve is abnormal. There is severe calcifcation of the aortic valve. Aortic valve regurgitation is mild. Moderate aortic stenosis is  present. Aortic valve mean gradient measures 21.0 mmHg. Aortic valve peak gradient measures 34.8  mmHg. Aortic valve area, by VTI measures 1.07 cm. Pulmonic Valve: The pulmonic valve was normal in structure. Pulmonic valve regurgitation is trivial. No evidence of pulmonic stenosis. Aorta: The aortic root is normal in size and structure. There is mild dilatation of the aortic arch, measuring 36 mm. Venous: The inferior vena cava was not well visualized. IAS/Shunts: The interatrial septum was not well visualized.  LEFT VENTRICLE PLAX 2D LVIDd:         4.60 cm  Diastology LVIDs:         3.45 cm  LV e' medial:    4.13 cm/s LV PW:         1.00 cm  LV E/e' medial:  18.0 LV IVS:        1.40 cm  LV e' lateral:   5.22 cm/s LVOT diam:     2.10 cm  LV E/e' lateral: 14.2 LV SV:         65 LV SV Index:   32 LVOT Area:     3.46 cm  RIGHT VENTRICLE RV S prime:     8.70 cm/s TAPSE (M-mode): 1.4 cm LEFT ATRIUM             Index       RIGHT ATRIUM           Index LA diam:        3.90 cm 1.92 cm/m  RA Area:     18.10 cm LA Vol (A2C):   78.0 ml  38.37 ml/m RA Volume:   43.10 ml  21.20 ml/m LA Vol (A4C):   59.3 ml 29.17 ml/m LA Biplane Vol: 69.1 ml 33.99 ml/m  AORTIC VALVE AV Area (Vmax):    1.10 cm AV Area (Vmean):   1.04 cm AV Area (VTI):     1.07 cm AV Vmax:           295.00 cm/s AV Vmean:          205.667 cm/s AV VTI:            0.606 m AV Peak Grad:      34.8 mmHg AV Mean Grad:      21.0 mmHg LVOT Vmax:         93.35 cm/s LVOT Vmean:        61.850 cm/s LVOT VTI:          0.188 m LVOT/AV VTI ratio: 0.31  AORTA Ao Root diam: 3.50 cm MITRAL VALVE                TRICUSPID VALVE MV Area (PHT): 2.10 cm     TR Peak grad:   24.2 mmHg MV Decel Time: 362 msec     TR Vmax:        246.00 cm/s MV E velocity: 74.15 cm/s MV A velocity: 107.20 cm/s  SHUNTS MV E/A ratio:  0.69         Systemic VTI:  0.19 m                             Systemic Diam: 2.10 cm Cherlynn Kaiser MD Electronically signed by Cherlynn Kaiser MD Signature Date/Time: 04/28/2021/12:53:28 PM    Final       Subjective: Patient seen and examined the bedside this morning.  Hemodynamically stable for discharge.  I discussed the discharge plan with the wife  at the bedside.  Discharge Exam: Vitals:   04/29/21 2113 04/30/21 0800  BP: 138/80 (!) 163/88  Pulse:  70  Resp: 19 20  Temp: 97.9 F (36.6 C) 98 F (36.7 C)  SpO2: 95% 95%   Vitals:   04/29/21 0417 04/29/21 1513 04/29/21 2113 04/30/21 0800  BP: 113/62  138/80 (!) 163/88  Pulse: 72 73  70  Resp:  '13 19 20  '$ Temp: 98.1 F (36.7 C) 98.2 F (36.8 C) 97.9 F (36.6 C) 98 F (36.7 C)  TempSrc: Oral Oral Oral Oral  SpO2: 94% 97% 95% 95%  Weight:      Height:        General: Pt is alert, awake, not in acute distress Cardiovascular: RRR, S1/S2 +, no rubs, no gallops Respiratory: CTA bilaterally, no wheezing, no rhonchi Abdominal: Soft, NT, ND, bowel sounds + Extremities: no edema, no cyanosis    The results of significant diagnostics from this hospitalization (including imaging, microbiology, ancillary and  laboratory) are listed below for reference.     Microbiology: Recent Results (from the past 240 hour(s))  Resp Panel by RT-PCR (Flu A&B, Covid) Nasopharyngeal Swab     Status: None   Collection Time: 04/27/21  9:42 AM   Specimen: Nasopharyngeal Swab; Nasopharyngeal(NP) swabs in vial transport medium  Result Value Ref Range Status   SARS Coronavirus 2 by RT PCR NEGATIVE NEGATIVE Final    Comment: (NOTE) SARS-CoV-2 target nucleic acids are NOT DETECTED.  The SARS-CoV-2 RNA is generally detectable in upper respiratory specimens during the acute phase of infection. The lowest concentration of SARS-CoV-2 viral copies this assay can detect is 138 copies/mL. A negative result does not preclude SARS-Cov-2 infection and should not be used as the sole basis for treatment or other patient management decisions. A negative result may occur with  improper specimen collection/handling, submission of specimen other than nasopharyngeal swab, presence of viral mutation(s) within the areas targeted by this assay, and inadequate number of viral copies(<138 copies/mL). A negative result must be combined with clinical observations, patient history, and epidemiological information. The expected result is Negative.  Fact Sheet for Patients:  EntrepreneurPulse.com.au  Fact Sheet for Healthcare Providers:  IncredibleEmployment.be  This test is no t yet approved or cleared by the [place] FDA and  has been authorized for detection and/or diagnosis of SARS-CoV-2 by FDA under an Emergency Use Authorization (EUA). This EUA will remain  in effect (meaning this test can be used) for the duration of the COVID-19 declaration under Section 564(b)(1) of the Act, 21 U.S.C.section 360bbb-3(b)(1), unless the authorization is terminated  or revoked sooner.       Influenza A by PCR NEGATIVE NEGATIVE Final   Influenza B by PCR NEGATIVE NEGATIVE Final    Comment: (NOTE) The  Xpert Xpress SARS-CoV-2/FLU/RSV plus assay is intended as an aid in the diagnosis of influenza from Nasopharyngeal swab specimens and should not be used as a sole basis for treatment. Nasal washings and aspirates are unacceptable for Xpert Xpress SARS-CoV-2/FLU/RSV testing.  Fact Sheet for Patients: EntrepreneurPulse.com.au  Fact Sheet for Healthcare Providers: IncredibleEmployment.be  This test is not yet approved or cleared by the [place] FDA and has been authorized for detection and/or diagnosis of SARS-CoV-2 by FDA under an Emergency Use Authorization (EUA). This EUA will remain in effect (meaning this test can be used) for the duration of the COVID-19 declaration under Section 564(b)(1) of the Act, 21 U.S.C. section 360bbb-3(b)(1), unless the authorization is terminated or revoked.  Performed at [facility], [address], [place], [place] [address]      Labs: BNP (last 3 results) Recent Labs    06/21/20 0744  BNP [id number]   Basic Metabolic Panel: Recent Labs  Lab 04/27/21 0950 04/27/21 2215 04/29/21 0444 04/30/21 0257  NA 138  --  140 139  K 4.0  --  3.4* 3.5  CL 102  --  104 103  CO2 29  --  29 29  GLUCOSE 124*  --  95 92  BUN 19  --  14 13  CREATININE 1.52* 1.13 1.09 1.03  CALCIUM 9.3  --  9.1 9.2   Liver Function Tests: Recent Labs  Lab 04/27/21 0950  AST 40  ALT 21  ALKPHOS 40  BILITOT 0.5  PROT 8.7*  ALBUMIN 4.4   No results for input(s): LIPASE, AMYLASE in the last 168 hours. No results for input(s): AMMONIA in the last 168 hours. CBC: Recent Labs  Lab 04/27/21 0950 04/27/21 2215 04/29/21 0444  WBC 9.1 17.7* 7.5  NEUTROABS 7.2  --   --   HGB 14.7 14.3 13.3  HCT 45.9 43.3 40.1  MCV 98.9 97.7 94.4  PLT 143* 149* 126*   Cardiac Enzymes: No results for input(s): CKTOTAL, CKMB, CKMBINDEX, TROPONINI in the last 168 hours. BNP: Invalid input(s): POCBNP CBG: Recent Labs  Lab 04/27/21 0937   GLUCAP 117*   D-Dimer No results for input(s): DDIMER in the last 72 hours. Hgb A1c Recent Labs    04/28/21 0537  HGBA1C 6.0*   Lipid Profile Recent Labs    04/28/21 0537  CHOL 74  HDL 45  LDLCALC 20  TRIG 47  CHOLHDL 1.6   Thyroid function studies No results for input(s): TSH, T4TOTAL, T3FREE, THYROIDAB in the last 72 hours.  Invalid input(s): FREET3 Anemia work up No results for input(s): VITAMINB12, FOLATE, FERRITIN, TIBC, IRON, RETICCTPCT in the last 72 hours. Urinalysis    Component Value Date/Time   COLORURINE YELLOW 04/27/2021 1156   APPEARANCEUR CLEAR 04/27/2021 1156   LABSPEC 1.021 04/27/2021 1156   LABSPEC 1.005 06/25/2011 1502   PHURINE 7.0 04/27/2021 1156   GLUCOSEU NEGATIVE 04/27/2021 1156   HGBUR SMALL (A) 04/27/2021 1156   BILIRUBINUR NEGATIVE 04/27/2021 1156   BILIRUBINUR Negative 06/25/2011 1502   KETONESUR NEGATIVE 04/27/2021 1156   PROTEINUR 100 (A) 04/27/2021 1156   UROBILINOGEN 0.2 10/09/2011 1634   NITRITE NEGATIVE 04/27/2021 1156   LEUKOCYTESUR NEGATIVE 04/27/2021 1156   LEUKOCYTESUR Negative 06/25/2011 1502   Sepsis Labs Invalid input(s): PROCALCITONIN,  WBC,  LACTICIDVEN Microbiology Recent Results (from the past 240 hour(s))  Resp Panel by RT-PCR (Flu A&B, Covid) Nasopharyngeal Swab     Status: None   Collection Time: 04/27/21  9:42 AM   Specimen: Nasopharyngeal Swab; Nasopharyngeal(NP) swabs in vial transport medium  Result Value Ref Range Status   SARS Coronavirus 2 by RT PCR NEGATIVE NEGATIVE Final    Comment: (NOTE) SARS-CoV-2 target nucleic acids are NOT DETECTED.  The SARS-CoV-2 RNA is generally detectable in upper respiratory specimens during the acute phase of infection. The lowest concentration of SARS-CoV-2 viral copies this assay can detect is 138 copies/mL. A negative result does not preclude SARS-Cov-2 infection and should not be used as the sole basis for treatment or other patient management decisions. A negative  result may occur with  improper specimen collection/handling, submission of specimen other than nasopharyngeal swab, presence of viral mutation(s) within the areas targeted by this assay, and inadequate number of  viral copies(<138 copies/mL). A negative result must be combined with clinical observations, patient history, and epidemiological information. The expected result is Negative.  Fact Sheet for Patients:  EntrepreneurPulse.com.au  Fact Sheet for Healthcare Providers:  IncredibleEmployment.be  This test is no t yet approved or cleared by the [place] FDA and  has been authorized for detection and/or diagnosis of SARS-CoV-2 by FDA under an Emergency Use Authorization (EUA). This EUA will remain  in effect (meaning this test can be used) for the duration of the COVID-19 declaration under Section 564(b)(1) of the Act, 21 U.S.C.section 360bbb-3(b)(1), unless the authorization is terminated  or revoked sooner.       Influenza A by PCR NEGATIVE NEGATIVE Final   Influenza B by PCR NEGATIVE NEGATIVE Final    Comment: (NOTE) The Xpert Xpress SARS-CoV-2/FLU/RSV plus assay is intended as an aid in the diagnosis of influenza from Nasopharyngeal swab specimens and should not be used as a sole basis for treatment. Nasal washings and aspirates are unacceptable for Xpert Xpress SARS-CoV-2/FLU/RSV testing.  Fact Sheet for Patients: EntrepreneurPulse.com.au  Fact Sheet for Healthcare Providers: IncredibleEmployment.be  This test is not yet approved or cleared by the [place] FDA and has been authorized for detection and/or diagnosis of SARS-CoV-2 by FDA under an Emergency Use Authorization (EUA). This EUA will remain in effect (meaning this test can be used) for the duration of the COVID-19 declaration under Section 564(b)(1) of the Act, 21 U.S.C. section 360bbb-3(b)(1), unless the authorization is  terminated or revoked.  Performed at [facility], [address], [place], [place] [address]     Please note: You were cared for by a hospitalist during your hospital stay. Once you are discharged, your primary care physician will handle any further medical issues. Please note that NO REFILLS for any discharge medications will be authorized once you are discharged, as it is imperative that you return to your primary care physician (or establish a relationship with a primary care physician if you do not have one) for your post hospital discharge needs so that they can reassess your need for medications and monitor your lab values.    Time coordinating discharge: 40 minutes  SIGNED:   Shelly Coss, MD  [facility] 04/30/2021, 11:48 AM Pager [phone]  If 7PM-7AM, please contact night-coverage [email or web address] Password [email or web address]

## 2021-04-30 NOTE — Progress Notes (Signed)
Reviewed all discharge paperwork with pt. Both IV's removed, pt was dressed and discharged

## 2021-05-01 ENCOUNTER — Other Ambulatory Visit: Payer: Self-pay | Admitting: *Deleted

## 2021-05-01 NOTE — Patient Outreach (Signed)
[facility]) Care Management  05/01/2021  George Bradley November 01, 1940 [id number]   Noted member was admitted to hospital 8/6-8/9 with recurrent stroke.  Call placed to wife, state he is still weak but better than he was prior to hospitalization.  Denies any urgent concerns, encouraged to contact this care manager with questions.  Agrees to follow up within the next week to follow up on post discharge MD appointments.   Goals Addressed             This Visit's Progress    [facility] - Find Help in My Community (caregiver burnout resources)   On track    Timeframe:  Short-Term Goal Priority:  High Start Date:           8/3                Expected End Date: 9/3    Barriers: Health Behaviors Other - Dementia    - follow-up on any referrals for help I am given    Why is this important?   Knowing how and where to find help for yourself or family in your neighborhood and community is an important skill.  You will want to take some steps to learn how.    Notes:   4/20 - Referral to [facility] for Palliative [facility]  5/11 - Unable to place referral to [facility].  Call placed to PCP office to request referral be placed to [place].  Call placed to [facility] program for more information on participation  5/18 - Confirmed member has home visit with [facility]. Provided information for Driver rehab program, she will speak to member and will call for appointment if he agrees  6/6 - Declines to attend driver's rehab at this time, Has had home visit from [facility]  8/3 - Wife request resources for dementia caregiver support group.  Will place referral to [facility]  8/10 - [place] has been in contact, awaiting list of community resources     [facility] - Keep or Improve My Strength-Stroke   Not on track    Timeframe:  Long-Range Goal Priority:  Medium Start Date:          4/20                   Expected End Date:   10/20                    Barriers: Health Behaviors    - arrange in-home help services - increase activity or exercise time a little every week - know who to call for help if I fall    Why is this important?   Before the stroke you probably did not think much about being safe when you are up and about.  Now, it may be harder for you to get around.  It may also be easier for you to trip or fall.  It is common to have muscle weakness after a stroke. You may also feel like you cannot control an arm or leg.  It will be helpful to work with a physical therapist to get your strength and muscle control back.  It is good to stay as active as you can. Walking and stretching help you stay strong and flexible.  The physical therapist will develop an exercise program just for you.     Notes:   4/20 - Discussed the possibility of having home health PT for member, not ordered  at this time but wife will consider  5/11 - PT was not ordered after recent discharge, wife feels his BP may be too unstable to participate.  Advised to notify this care manager if they would like to consider  5/18 - Wife report patient is using exercise equipment more in the home, slowly increasing strength  6/6 - Per wife, member is getting stronger, walking better.  Will follow up with neurology on 7/11  7/6 - Wife report ongoing improvement since having stroke.  Blood pressure remains labile but manageable.    8/3 - Per wife, member now able to walk without using walker, steadily improving.  8/10 - Recurrent stroke, now will have to restart PT/OT with outpatient rehab.       [facility] - Make and Keep All Appointments   On track    Timeframe:  Short-Term Goal Priority:  High Start Date:           6/6            Expected End Date:  10/6 (goal extended due to readmission)           Barriers: Other - Dementia    - call to cancel if needed - keep a calendar with appointment dates    Why is this important?   Part of staying healthy is  seeing the doctor for follow-up care.  If you forget your appointments, there are some things you can do to stay on track.    Notes:   4/20 - Conference call placed to Neurology office in attempt to schedule visit, unsuccessful, wife will call back  5/11 - Cardiology 5/16, PCP - 5/24, and Neurology 7/11  5/18 - Confirmed member attended cardiology on 5/16, no changes made to plan of care.  Other follow ups noted above, home visit with palliative care on 5/31  6/6 - Palliative care visit completed, next one scheduled for 6/28  7/7 - Palliative visit on 6/28 complete, PCP office visit scheduled for 7/11, no transportation needs identified  8/3 - PCP appointment completed as well as visit with cardiology due to extremity swelling.  Using compression socks and elevating legs for relief.  8/10 - Calls placed to PCP and Neurology offices requesting follow up appointments per discharge instructions, voice messages left. Visit with oncology on 8/19 and cardiology on 8/26        Valente David, RN, MSN [place] Manager [phone]

## 2021-05-02 ENCOUNTER — Ambulatory Visit: Payer: Medicare Other | Attending: Internal Medicine | Admitting: Physical Therapy

## 2021-05-02 ENCOUNTER — Other Ambulatory Visit: Payer: Self-pay

## 2021-05-02 DIAGNOSIS — M6281 Muscle weakness (generalized): Secondary | ICD-10-CM | POA: Insufficient documentation

## 2021-05-02 DIAGNOSIS — R2689 Other abnormalities of gait and mobility: Secondary | ICD-10-CM | POA: Diagnosis not present

## 2021-05-02 DIAGNOSIS — R29898 Other symptoms and signs involving the musculoskeletal system: Secondary | ICD-10-CM | POA: Insufficient documentation

## 2021-05-02 DIAGNOSIS — R2681 Unsteadiness on feet: Secondary | ICD-10-CM | POA: Diagnosis not present

## 2021-05-03 ENCOUNTER — Other Ambulatory Visit: Payer: Self-pay | Admitting: Hematology and Oncology

## 2021-05-03 ENCOUNTER — Telehealth: Payer: Self-pay

## 2021-05-03 ENCOUNTER — Inpatient Hospital Stay: Payer: Medicare Other | Attending: Hematology and Oncology

## 2021-05-03 DIAGNOSIS — Z85810 Personal history of malignant neoplasm of tongue: Secondary | ICD-10-CM | POA: Insufficient documentation

## 2021-05-03 DIAGNOSIS — D696 Thrombocytopenia, unspecified: Secondary | ICD-10-CM | POA: Insufficient documentation

## 2021-05-03 DIAGNOSIS — Z8673 Personal history of transient ischemic attack (TIA), and cerebral infarction without residual deficits: Secondary | ICD-10-CM | POA: Insufficient documentation

## 2021-05-03 DIAGNOSIS — G620 Drug-induced polyneuropathy: Secondary | ICD-10-CM | POA: Insufficient documentation

## 2021-05-03 DIAGNOSIS — D472 Monoclonal gammopathy: Secondary | ICD-10-CM

## 2021-05-03 LAB — COMPREHENSIVE METABOLIC PANEL
ALT: 16 U/L (ref 0–44)
AST: 19 U/L (ref 15–41)
Albumin: 3.3 g/dL — ABNORMAL LOW (ref 3.5–5.0)
Alkaline Phosphatase: 34 U/L — ABNORMAL LOW (ref 38–126)
Anion gap: 7 (ref 5–15)
BUN: 17 mg/dL (ref 8–23)
CO2: 29 mmol/L (ref 22–32)
Calcium: 9.1 mg/dL (ref 8.9–10.3)
Chloride: 106 mmol/L (ref 98–111)
Creatinine, Ser: 1.04 mg/dL (ref 0.61–1.24)
GFR, Estimated: >60 mL/min (ref >60)
Glucose, Bld: 78 mg/dL (ref 70–99)
Potassium: 4 mmol/L (ref 3.5–5.1)
Sodium: 142 mmol/L (ref 135–145)
Total Bilirubin: 0.2 mg/dL — ABNORMAL LOW (ref 0.3–1.2)
Total Protein: 6.6 g/dL (ref 6.5–8.1)

## 2021-05-03 LAB — CBC WITH DIFFERENTIAL/PLATELET
Abs Immature Granulocytes: 0.01 10*3/uL (ref 0.00–0.07)
Basophils Absolute: 0 10*3/uL (ref 0.0–0.1)
Basophils Relative: 1 %
Eosinophils Absolute: 0.4 10*3/uL (ref 0.0–0.5)
Eosinophils Relative: 6 %
HCT: 35.5 % — ABNORMAL LOW (ref 39.0–52.0)
Hemoglobin: 11.8 g/dL — ABNORMAL LOW (ref 13.0–17.0)
Immature Granulocytes: 0 %
Lymphocytes Relative: 26 %
Lymphs Abs: 1.5 10*3/uL (ref 0.7–4.0)
MCH: 31.5 pg (ref 26.0–34.0)
MCHC: 33.2 g/dL (ref 30.0–36.0)
MCV: 94.7 fL (ref 80.0–100.0)
Monocytes Absolute: 0.7 10*3/uL (ref 0.1–1.0)
Monocytes Relative: 12 %
Neutro Abs: 3.2 10*3/uL (ref 1.7–7.7)
Neutrophils Relative %: 55 %
Platelets: 139 10*3/uL — ABNORMAL LOW (ref 150–400)
RBC: 3.75 MIL/uL — ABNORMAL LOW (ref 4.22–5.81)
RDW: 14.5 % (ref 11.5–15.5)
WBC: 5.7 10*3/uL (ref 4.0–10.5)
nRBC: 0 % (ref 0.0–0.2)
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
## 2021-05-03 NOTE — Telephone Encounter (Signed)
Called and offered to delay lab appt today due to recent hospitalization. He declined and would like to keep appts as scheduled.

## 2021-05-06 ENCOUNTER — Other Ambulatory Visit: Payer: Self-pay | Admitting: *Deleted

## 2021-05-06 LAB — KAPPA/LAMBDA LIGHT CHAINS
Kappa free light chain: 28.3 mg/L — ABNORMAL HIGH (ref 3.3–19.4)
Kappa, lambda light chain ratio: 0.99 (ref 0.26–1.65)
Lambda free light chains: 28.5 mg/L — ABNORMAL HIGH (ref 5.7–26.3)

## 2021-05-06 NOTE — Patient Outreach (Signed)
[facility]) Care Management  05/06/2021  George Bradley [birth year] [id number]   RED ON EMMI ALERT - Stroke Day # 3 Date: 8/11 Red Alert Reason: Feeling worse   Outreach attempt #1, successful.    Per wife, member has been improving.  State he may have been frustrated during EMMI call due to not being completely back to normal.  He has not been sleeping well, will discuss with PCP this Thursday.  Also was able to get a sooner visit with neurology for 9/6.  Denies any urgent concerns at this time.   Plan: RN CM will follow up with member/wife within the next 3 weeks.  Valente David, [place], MSN [place] [phone]

## 2021-05-07 ENCOUNTER — Ambulatory Visit: Payer: Self-pay | Admitting: *Deleted

## 2021-05-07 LAB — MULTIPLE MYELOMA PANEL, SERUM
Albumin SerPl Elph-Mcnc: 3.3 g/dL (ref 2.9–4.4)
Albumin/Glob SerPl: 1.2 (ref 0.7–1.7)
Alpha 1: 0.2 g/dL (ref 0.0–0.4)
Alpha2 Glob SerPl Elph-Mcnc: 0.7 g/dL (ref 0.4–1.0)
B-Globulin SerPl Elph-Mcnc: 0.7 g/dL (ref 0.7–1.3)
Gamma Glob SerPl Elph-Mcnc: 1.2 g/dL (ref 0.4–1.8)
Globulin, Total: 2.9 g/dL (ref 2.2–3.9)
IgA: 119 mg/dL (ref 61–437)
IgG (Immunoglobin G), Serum: 1235 mg/dL (ref 603–1613)
IgM (Immunoglobulin M), Srm: 77 mg/dL (ref 15–143)
M Protein SerPl Elph-Mcnc: 0.7 g/dL — ABNORMAL HIGH
Total Protein ELP: 6.2 g/dL (ref 6.0–8.5)

## 2021-05-09 ENCOUNTER — Ambulatory Visit: Payer: Medicare Other

## 2021-05-09 DIAGNOSIS — G629 Polyneuropathy, unspecified: Secondary | ICD-10-CM | POA: Diagnosis not present

## 2021-05-09 DIAGNOSIS — I2510 Atherosclerotic heart disease of native coronary artery without angina pectoris: Secondary | ICD-10-CM | POA: Diagnosis not present

## 2021-05-09 DIAGNOSIS — I63231 Cerebral infarction due to unspecified occlusion or stenosis of right carotid arteries: Secondary | ICD-10-CM | POA: Diagnosis not present

## 2021-05-09 DIAGNOSIS — R413 Other amnesia: Secondary | ICD-10-CM | POA: Diagnosis not present

## 2021-05-09 DIAGNOSIS — R030 Elevated blood-pressure reading, without diagnosis of hypertension: Secondary | ICD-10-CM | POA: Diagnosis not present

## 2021-05-09 DIAGNOSIS — E785 Hyperlipidemia, unspecified: Secondary | ICD-10-CM | POA: Diagnosis not present

## 2021-05-09 DIAGNOSIS — E039 Hypothyroidism, unspecified: Secondary | ICD-10-CM | POA: Diagnosis not present

## 2021-05-09 DIAGNOSIS — G4700 Insomnia, unspecified: Secondary | ICD-10-CM | POA: Diagnosis not present

## 2021-05-09 DIAGNOSIS — I6521 Occlusion and stenosis of right carotid artery: Secondary | ICD-10-CM | POA: Diagnosis not present

## 2021-05-09 DIAGNOSIS — I951 Orthostatic hypotension: Secondary | ICD-10-CM | POA: Diagnosis not present

## 2021-05-10 ENCOUNTER — Other Ambulatory Visit: Payer: Self-pay

## 2021-05-10 ENCOUNTER — Inpatient Hospital Stay (HOSPITAL_BASED_OUTPATIENT_CLINIC_OR_DEPARTMENT_OTHER): Payer: Medicare Other | Admitting: Hematology and Oncology

## 2021-05-10 ENCOUNTER — Encounter: Payer: Self-pay | Admitting: Hematology and Oncology

## 2021-05-10 DIAGNOSIS — D472 Monoclonal gammopathy: Secondary | ICD-10-CM | POA: Diagnosis not present

## 2021-05-10 DIAGNOSIS — G620 Drug-induced polyneuropathy: Secondary | ICD-10-CM

## 2021-05-10 DIAGNOSIS — D696 Thrombocytopenia, unspecified: Secondary | ICD-10-CM

## 2021-05-10 DIAGNOSIS — I63231 Cerebral infarction due to unspecified occlusion or stenosis of right carotid arteries: Secondary | ICD-10-CM | POA: Diagnosis not present

## 2021-05-10 DIAGNOSIS — T451X5A Adverse effect of antineoplastic and immunosuppressive drugs, initial encounter: Secondary | ICD-10-CM

## 2021-05-10 DIAGNOSIS — Z8673 Personal history of transient ischemic attack (TIA), and cerebral infarction without residual deficits: Secondary | ICD-10-CM | POA: Diagnosis not present

## 2021-05-10 DIAGNOSIS — Z85810 Personal history of malignant neoplasm of tongue: Secondary | ICD-10-CM | POA: Diagnosis not present

## 2021-05-10 MED ORDER — GABAPENTIN 300 MG PO CAPS: ORAL_CAPSULE | ORAL | 0 refills | Status: DC

## 2021-05-10 NOTE — Assessment & Plan Note (Signed)
He has multifactorial pain.  His abdominal pain has been extensively evaluated and there is a neuropathic component to it related to prior feeding tube placement He tolerated gabapentin well I refilled his prescription

## 2021-05-10 NOTE — Assessment & Plan Note (Signed)
The cause could be related to his underlying medical condition. It is mild and there is little change compared from previous platelet count. The patient denies recent history of bleeding such as epistaxis, hematuria or hematochezia. He is asymptomatic from the thrombocytopenia. I will observe for now.

## 2021-05-10 NOTE — Progress Notes (Signed)
[place] OFFICE PROGRESS NOTE  Patient Care Team: [name], MD as PCP - General (Internal Medicine) Rogene Houston, MD as Consulting Physician (Gastroenterology) Martinique, Peter M, MD as Consulting Physician (Cardiology) Valente David, RN as [facility] Management  ASSESSMENT & PLAN:  MGUS (monoclonal gammopathy of unknown significance) Clinically, he has no signs of end organ damage His myeloma panel is stable For that, I recommend once a year visit We discussed the natural history of MGUS  Thrombocytopenia ([place]) The cause could be related to his underlying medical condition. It is mild and there is little change compared from previous platelet count. The patient denies recent history of bleeding such as epistaxis, hematuria or hematochezia. He is asymptomatic from the thrombocytopenia. I will observe for now.   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  His abdominal pain has been extensively evaluated and there is a neuropathic component to it related to prior feeding tube placement He tolerated gabapentin well I refilled his prescription  Cerebrovascular accident (CVA) ([place]) He has recurrent history of stroke He is on medical management He has balance difficulties We discussed the importance of exercise as tolerated  He is undergoing physical therapy and rehab  No orders of the defined types were placed in this encounter.   All questions were answered. The patient knows to call the clinic with any problems, questions or concerns. The total time spent in the appointment was 20 minutes encounter with patients including review of chart and various tests results, discussions about plan of care and coordination of care plan   Heath Lark, MD 05/10/2021 1:35 PM  INTERVAL HISTORY: Please see below for problem oriented charting. 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 PET/CT scan showed no evidence of distant metastatic disease however he does have evidence of lymphadenopathy. He was offered concurrent chemoradiation therapy with weekly cisplatin between August to October 2012. From 2012 to 2014 he had serial imaging studies which show no evidence of disease recurrence. 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  He is being observed  REVIEW OF SYSTEMS:   Constitutional: Denies fevers, chills or abnormal weight loss Eyes: Denies blurriness of vision Ears, nose, mouth, throat, and face: Denies mucositis or sore throat Respiratory: Denies cough, dyspnea or wheezes Cardiovascular: Denies palpitation, chest discomfort or lower extremity swelling Gastrointestinal:  Denies nausea, heartburn or change in bowel habits Skin: Denies abnormal skin rashes Lymphatics: Denies new lymphadenopathy or easy bruising Behavioral/Psych: Mood is stable, no new changes  All other systems were reviewed with the patient and are negative.  I have reviewed the past medical history, past surgical history, social history and family history with the patient and they are unchanged from previous note.  ALLERGIES:  is allergic to zetia [ezetimibe].  MEDICATIONS:  Current Outpatient Medications  Medication Sig Dispense Refill   amLODipine (NORVASC) 5 MG tablet Take 0.5 tablets (2.5 mg total) by mouth daily as needed (blood pressure on the higher end, no parameter).  aspirin EC 81 MG EC tablet Take 1 tablet (81 mg total) by mouth  daily. Swallow whole. 30 tablet 11   BRILINTA 90 MG TABS tablet Take 90 mg by mouth 2 (two) times daily.     busPIRone (BUSPAR) 5 MG tablet Take 5 mg by mouth 2 (two) times daily as needed (anxiety).     cetirizine (ZYRTEC) 10 MG tablet Take 10 mg by mouth daily.     donepezil (ARICEPT) 5 MG tablet Take 5 mg by mouth at bedtime.     Evolocumab (REPATHA SURECLICK) [id number] MG/ML SOAJ Inject 140 mg into the skin every 14 (fourteen) days. 2 mL 11   fludrocortisone (FLORINEF) 0.1 MG tablet Take 100 mcg by mouth daily.     gabapentin (NEURONTIN) 300 MG capsule TAKE 3 CAPSULES(900 MG) BY MOUTH THREE TIMES DAILY 270 capsule 0   indomethacin (INDOCIN) 25 MG capsule Take 25 mg by mouth 2 (two) times daily.     levothyroxine (SYNTHROID, LEVOTHROID) 100 MCG tablet TAKE ONE TABLET BY MOUTH ONCE DAILY BEFORE  BREAKFAST 90 tablet 0   LORazepam (ATIVAN) 1 MG tablet Take 1 tablet (1 mg total) by mouth every 8 (eight) hours as needed for anxiety. 90 tablet 0   pilocarpine (SALAGEN) 7.5 MG tablet Take 7.5 mg by mouth 3 (three) times daily.     rosuvastatin (CRESTOR) 20 MG tablet Take 1 tablet (20 mg total) by mouth daily. 90 tablet 3   traMADol (ULTRAM) 50 MG tablet Take 1 tablet (50 mg total) by mouth 2 (two) times daily as needed for moderate pain. 90 tablet 5   No current facility-administered medications for this visit.    PHYSICAL EXAMINATION: ECOG PERFORMANCE STATUS: 1 - Symptomatic but completely ambulatory  Vitals:   05/10/21 1017  BP: 130/64  Pulse: (!) 54  Resp: 18  Temp: (!) 97.5 F (36.4 C)  SpO2: 100%   Filed Weights   05/10/21 1017  Weight: 171 lb 3.2 oz (77.7 kg)    GENERAL:alert, no distress and comfortable NEURO: alert & oriented x 3 with fluent speech, no focal motor/sensory deficits  LABORATORY DATA:  I have reviewed the data as listed    Component Value Date/Time   NA 142 05/03/2021 1013   NA 141 08/14/2020 1431   NA 140 08/21/2017 0952   K 4.0 05/03/2021 1013   K 5.1  08/21/2017 0952   CL 106 05/03/2021 1013   CL 106 11/25/2012 0812   CO2 29 05/03/2021 1013   CO2 25 08/21/2017 0952   GLUCOSE 78 05/03/2021 1013   GLUCOSE 92 08/21/2017 0952   GLUCOSE 102 (H) 11/25/2012 0812   BUN 17 05/03/2021 1013   BUN 19 08/14/2020 1431   BUN 33.9 (H) 08/21/2017 0952   CREATININE 1.04 05/03/2021 1013   CREATININE 1.30 (H) 04/11/2019 0740   CREATININE 1.6 (H) 08/21/2017 0952   CALCIUM 9.1 05/03/2021 1013   CALCIUM 10.0 08/21/2017 0952   PROT 6.6 05/03/2021 1013   PROT 7.3 05/20/2018 0820   PROT 8.5 (H) 08/21/2017 0952   ALBUMIN 3.3 (L) 05/03/2021 1013   ALBUMIN 4.6 05/20/2018 0820   ALBUMIN 4.2 08/21/2017 0952   AST 19 05/03/2021 1013   AST 21 04/11/2019 0740   AST 23 08/21/2017 0952   ALT 16 05/03/2021 1013   ALT 16 04/11/2019 0740   ALT 19 08/21/2017 0952   ALKPHOS 34 (L) 05/03/2021 1013   ALKPHOS 40 08/21/2017 0952   BILITOT 0.2 (L) 05/03/2021 1013   BILITOT  0.3 04/11/2019 0740   BILITOT 0.26 08/21/2017 0952   GFRNONAA >60 05/03/2021 1013   GFRNONAA 52 (L) 04/11/2019 0740   GFRAA 61 08/14/2020 1431   GFRAA >60 04/11/2019 0740    No results found for: SPEP, UPEP  Lab Results  Component Value Date   WBC 5.7 05/03/2021   NEUTROABS 3.2 05/03/2021   HGB 11.8 (L) 05/03/2021   HCT 35.5 (L) 05/03/2021   MCV 94.7 05/03/2021   PLT 139 (L) 05/03/2021      Chemistry      Component Value Date/Time   NA 142 05/03/2021 1013   NA 141 08/14/2020 1431   NA 140 08/21/2017 0952   K 4.0 05/03/2021 1013   K 5.1 08/21/2017 0952   CL 106 05/03/2021 1013   CL 106 11/25/2012 0812   CO2 29 05/03/2021 1013   CO2 25 08/21/2017 0952   BUN 17 05/03/2021 1013   BUN 19 08/14/2020 1431   BUN 33.9 (H) 08/21/2017 0952   CREATININE 1.04 05/03/2021 1013   CREATININE 1.30 (H) 04/11/2019 0740   CREATININE 1.6 (H) 08/21/2017 0952      Component Value Date/Time   CALCIUM 9.1 05/03/2021 1013   CALCIUM 10.0 08/21/2017 0952   ALKPHOS 34 (L) 05/03/2021 1013    ALKPHOS 40 08/21/2017 0952   AST 19 05/03/2021 1013   AST 21 04/11/2019 0740   AST 23 08/21/2017 0952   ALT 16 05/03/2021 1013   ALT 16 04/11/2019 0740   ALT 19 08/21/2017 0952   BILITOT 0.2 (L) 05/03/2021 1013   BILITOT 0.3 04/11/2019 0740   BILITOT 0.26 08/21/2017 [id number]

## 2021-05-10 NOTE — Assessment & Plan Note (Signed)
He has recurrent history of stroke He is on medical management He has balance difficulties We discussed the importance of exercise as tolerated  He is undergoing physical therapy and rehab

## 2021-05-10 NOTE — Assessment & Plan Note (Signed)
Clinically, he has no signs of end organ damage His myeloma panel is stable For that, I recommend once a year visit We discussed the natural history of MGUS

## 2021-05-13 ENCOUNTER — Other Ambulatory Visit: Payer: Self-pay

## 2021-05-13 ENCOUNTER — Ambulatory Visit: Payer: Medicare Other | Admitting: Physical Therapy

## 2021-05-13 VITALS — BP 140/75 | HR 55

## 2021-05-13 DIAGNOSIS — R2681 Unsteadiness on feet: Secondary | ICD-10-CM

## 2021-05-13 DIAGNOSIS — R2689 Other abnormalities of gait and mobility: Secondary | ICD-10-CM | POA: Diagnosis not present

## 2021-05-13 DIAGNOSIS — M6281 Muscle weakness (generalized): Secondary | ICD-10-CM

## 2021-05-13 DIAGNOSIS — R29898 Other symptoms and signs involving the musculoskeletal system: Secondary | ICD-10-CM | POA: Diagnosis not present

## 2021-05-13 NOTE — Patient Instructions (Signed)
  Functional Quadriceps: SQUATS - HOLD ONTO COUNTER FOR SAFETY    Sit on edge of chair, feet flat on floor. Stand upright, extending knees fully. Repeat __10__ times per set. Do __1__ sets per session. Do __1-2_ sessions per day.      Heel Raises    Stand with support. Tighten pelvic floor and hold. With knees straight, raise heels off ground. Hold _3__ seconds. Relax for _2__ seconds. Repeat _10__ times. Do _1-2__ times a day.     BALANCE EXERCISES:  Standing Marching   Using a chair if necessary, march in place. Repeat  10 times. Do 1  sessions per day.  [email or web address].[email or web address]     Hip Backward Kick - ALTERNATING LEGS    Using a chair for balance, keep legs shoulder width apart and toes pointed for- ward. Slowly extend one leg back, keeping knee straight. Do not lean forward. Repeat with other leg. Repeat 10 times. Do 1 sessions per day.     Hip Side Kick - ALTERNATING LEGS    Holding a chair for balance, keep legs shoulder width apart and toes pointed forward. Swing a leg out to side, keeping knee straight. Do not lean. Repeat using other leg. Repeat 10 times. Do 1 sessions per day.   ALSO DO FORWARD KICKS - 10 REPS EACH LEG - ALTERNATING     Feet Heel-Toe "Tandem", Varied Arm Positions - Eyes Open - partial HEEL TO TOE   With eyes open, right foot directly in front of the other, arms out, look straight ahead at a stationary object. Hold 30 seconds. Repeat 2 times per session. Do 1 sessions per day.    Standing On One Leg Without Support .  Stand on one leg in neutral spine without support. Hold  10 seconds. Repeat on other leg. Do 1-2 repetitions, 1 sets.       Side-Stepping   Walk to left side with eyes open. Take even steps, leading with same foot. Make sure each foot lifts off the floor. Repeat in opposite direction. Repeat for 2 times along counter. Do 1 sessions per day.         Braiding   Move to side: 1) cross right  leg in front of left, 2) bring back leg out to side, then 3) cross right leg behind left, 4) bring left leg out to side. Continue sequence in same direction. Reverse sequence, moving in opposite direction. Repeat sequence 2 times along counter. 1 session/day..   Copyright  VHI. All rights reserved.
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
## 2021-05-16 ENCOUNTER — Ambulatory Visit: Payer: Medicare Other | Admitting: Physical Therapy

## 2021-05-17 ENCOUNTER — Ambulatory Visit: Payer: Medicare Other | Admitting: Student

## 2021-05-20 ENCOUNTER — Ambulatory Visit: Payer: Medicare Other | Admitting: Physical Therapy

## 2021-05-20 ENCOUNTER — Other Ambulatory Visit: Payer: Self-pay

## 2021-05-20 ENCOUNTER — Encounter: Payer: Self-pay | Admitting: Physical Therapy

## 2021-05-20 VITALS — BP 185/86 | HR 65

## 2021-05-20 DIAGNOSIS — R29898 Other symptoms and signs involving the musculoskeletal system: Secondary | ICD-10-CM | POA: Diagnosis not present

## 2021-05-20 DIAGNOSIS — R2689 Other abnormalities of gait and mobility: Secondary | ICD-10-CM

## 2021-05-20 DIAGNOSIS — R2681 Unsteadiness on feet: Secondary | ICD-10-CM

## 2021-05-20 DIAGNOSIS — M6281 Muscle weakness (generalized): Secondary | ICD-10-CM
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
## 2021-05-23 ENCOUNTER — Other Ambulatory Visit: Payer: Self-pay

## 2021-05-23 ENCOUNTER — Ambulatory Visit: Payer: Medicare Other | Attending: Internal Medicine | Admitting: Physical Therapy

## 2021-05-23 DIAGNOSIS — M6281 Muscle weakness (generalized): Secondary | ICD-10-CM

## 2021-05-23 DIAGNOSIS — R2689 Other abnormalities of gait and mobility: Secondary | ICD-10-CM | POA: Diagnosis not present

## 2021-05-23 DIAGNOSIS — R2681 Unsteadiness on feet: Secondary | ICD-10-CM | POA: Diagnosis not present

## 2021-05-23 NOTE — Patient Instructions (Signed)
  Strengthening: Hip Flexion - Resisted    With tubing around left ankle, anchor behind, bring leg forward, keeping knee straight. Repeat __10__ times per set. Do __1-2__ sets per session. Do _1-2___ sessions per day.  http://orth.exer.us/638   Copyright  VHI. All rights reserved.     Strengthening: Hip Abduction - Resisted    With tubing around right leg, other side toward anchor, extend leg out from side. Repeat _10___ times per set. Do _1-2___ sets per session. Do _1-2___ sessions per day.  http://orth.exer.us/634   Copyright  VHI. All rights reserved.     Strengthening: Hip Extension - Resisted    With tubing around right ankle, face anchor and pull leg straight back. Repeat __10-15__ times per set. Do __1-2_ sets per session. Do __1-2__ sessions per day.  http://orth.exer.us/636   Copyright  VHI. All rights reserved.
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
## 2021-05-28 ENCOUNTER — Encounter: Payer: Self-pay | Admitting: Neurology

## 2021-05-28 ENCOUNTER — Ambulatory Visit (INDEPENDENT_AMBULATORY_CARE_PROVIDER_SITE_OTHER): Payer: Medicare Other | Admitting: Neurology

## 2021-05-28 ENCOUNTER — Other Ambulatory Visit: Payer: Self-pay

## 2021-05-28 ENCOUNTER — Other Ambulatory Visit: Payer: Self-pay | Admitting: *Deleted

## 2021-05-28 VITALS — BP 182/77 | HR 60 | Ht 73.0 in | Wt 177.0 lb

## 2021-05-28 DIAGNOSIS — D472 Monoclonal gammopathy: Secondary | ICD-10-CM | POA: Diagnosis not present

## 2021-05-28 DIAGNOSIS — I6521 Occlusion and stenosis of right carotid artery: Secondary | ICD-10-CM

## 2021-05-28 DIAGNOSIS — G4700 Insomnia, unspecified: Secondary | ICD-10-CM | POA: Diagnosis not present

## 2021-05-28 DIAGNOSIS — I259 Chronic ischemic heart disease, unspecified: Secondary | ICD-10-CM | POA: Diagnosis not present

## 2021-05-28 DIAGNOSIS — I951 Orthostatic hypotension: Secondary | ICD-10-CM

## 2021-05-28 DIAGNOSIS — I63231 Cerebral infarction due to unspecified occlusion or stenosis of right carotid arteries: Secondary | ICD-10-CM

## 2021-05-28 DIAGNOSIS — R208 Other disturbances of skin sensation: Secondary | ICD-10-CM | POA: Diagnosis not present

## 2021-05-28 DIAGNOSIS — G6289 Other specified polyneuropathies: Secondary | ICD-10-CM | POA: Diagnosis not present

## 2021-05-28 MED ORDER — PYRIDOSTIGMINE BROMIDE 60 MG PO TABS: 60.0000 mg | ORAL_TABLET | Freq: Three times a day (TID) | ORAL | 11 refills | Status: DC

## 2021-05-28 MED ORDER — DOXEPIN HCL 10 MG PO CAPS: 10.0000 mg | ORAL_CAPSULE | Freq: Every day | ORAL | 11 refills | Status: DC

## 2021-05-28 NOTE — Progress Notes (Signed)
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 [phone]

## 2021-05-28 NOTE — Patient Outreach (Signed)
[facility]) Care Management  05/28/2021  George Bradley Aug 21, 1941 [id number]   Outgoing call placed to member's wife, state member is doing "well overall."  Denies any urgent concerns, encouraged to contact this care manager with questions.  Agrees to follow up within the next month.   Goals Addressed             This Visit's Progress    COMPLETED: [facility] - Find Help in My Community (caregiver burnout resources)   On track    Timeframe:  Short-Term Goal Priority:  High Start Date:           8/3                Expected End Date: 9/3    Barriers: Health Behaviors Other - Dementia    - follow-up on any referrals for help I am given    Why is this important?   Knowing how and where to find help for yourself or family in your neighborhood and community is an important skill.  You will want to take some steps to learn how.    Notes:   4/20 - Referral to [facility] for Palliative Care  5/11 - Unable to place referral to [facility].  Call placed to PCP office to request referral be placed to [facility].  Call placed to [facility] program for more information on participation  5/18 - Confirmed member has home visit with [facility]. Provided information for Driver rehab program, she will speak to member and will call for appointment if he agrees  6/6 - Declines to attend driver's rehab at this time, Has had home visit from [facility]  8/3 - Wife request resources for dementia caregiver support group.  Will place referral to Care [facility]  8/10 - [place] has been in contact, awaiting list of community resources     [facility] - Keep or Improve My Strength-Stroke   On track    Timeframe:  Long-Range Goal Priority:  Medium Start Date:          4/20                   Expected End Date:   10/20                   Barriers: Health Behaviors    - arrange in-home help services - increase activity or exercise time a little every week - know  who to call for help if I fall    Why is this important?   Before the stroke you probably did not think much about being safe when you are up and about.  Now, it may be harder for you to get around.  It may also be easier for you to trip or fall.  It is common to have muscle weakness after a stroke. You may also feel like you cannot control an arm or leg.  It will be helpful to work with a physical therapist to get your strength and muscle control back.  It is good to stay as active as you can. Walking and stretching help you stay strong and flexible.  The physical therapist will develop an exercise program just for you.     Notes:   4/20 - Discussed the possibility of having home health PT for member, not ordered at this time but wife will consider  5/11 - PT was not ordered after recent discharge, wife feels his BP may be too  unstable to participate.  Advised to notify this care manager if they would like to consider  5/18 - Wife report patient is using exercise equipment more in the home, slowly increasing strength  6/6 - Per wife, member is getting stronger, walking better.  Will follow up with neurology on 7/11  7/6 - Wife report ongoing improvement since having stroke.  Blood pressure remains labile but manageable.    8/3 - Per wife, member now able to walk without using walker, steadily improving.  8/10 - Recurrent stroke, now will have to restart PT/OT with outpatient rehab.    9/6 - Wife report improvement with ongoing outpatient therapy sessions.  Next appointment tomorrow.     [facility] - Make and Keep All Appointments   On track    Timeframe:  Short-Term Goal Priority:  High Start Date:           6/6            Expected End Date:  10/6 (goal extended due to readmission)           Barriers: Other - Dementia    - call to cancel if needed - keep a calendar with appointment dates    Why is this important?   Part of staying healthy is seeing the doctor for follow-up care.   If you forget your appointments, there are some things you can do to stay on track.    Notes:   4/20 - Conference call placed to Neurology office in attempt to schedule visit, unsuccessful, wife will call back  5/11 - Cardiology 5/16, PCP - 5/24, and Neurology 7/11  5/18 - Confirmed member attended cardiology on 5/16, no changes made to plan of care.  Other follow ups noted above, home visit with palliative care on 5/31  6/6 - Palliative care visit completed, next one scheduled for 6/28  7/7 - Palliative visit on 6/28 complete, PCP office visit scheduled for 7/11, no transportation needs identified  8/3 - PCP appointment completed as well as visit with cardiology due to extremity swelling.  Using compression socks and elevating legs for relief.  8/10 - Calls placed to PCP and Neurology offices requesting follow up appointments per discharge instructions, voice messages left. Visit with oncology on 8/19 and cardiology on 8/26  9/6 - Was seen by Neurology yesterday, concerned with blood pressure, 182/77 in office. However he has hypotension at home throughout the day.  Medication prescribed to help with blood pressure during the day.  Also placed on Doxepin for sleep.  Follow up with neuro in January.  Head/neck CT scheduled for tomorrow.  Home visit from palliative care on 9/14       Valente David, [place], MSN [place] [phone]

## 2021-05-29 ENCOUNTER — Ambulatory Visit: Payer: Medicare Other

## 2021-05-29 ENCOUNTER — Other Ambulatory Visit: Payer: Self-pay

## 2021-05-29 DIAGNOSIS — R2689 Other abnormalities of gait and mobility: Secondary | ICD-10-CM | POA: Diagnosis not present

## 2021-05-29 DIAGNOSIS — R2681 Unsteadiness on feet: Secondary | ICD-10-CM

## 2021-05-29 DIAGNOSIS — M6281 Muscle weakness (generalized): Secondary | ICD-10-CM | POA: Diagnosis not present
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
## 2021-05-30 ENCOUNTER — Inpatient Hospital Stay: Admission: RE | Admit: 2021-05-30 | Payer: Medicare Other | Source: Ambulatory Visit

## 2021-05-30 ENCOUNTER — Emergency Department (HOSPITAL_COMMUNITY): Payer: Medicare Other

## 2021-05-30 ENCOUNTER — Observation Stay (HOSPITAL_COMMUNITY)
Admission: EM | Admit: 2021-05-30 | Discharge: 2021-05-31 | Disposition: A | Discharge status: Home / Self Care | Payer: Medicare Other | Attending: Internal Medicine | Admitting: Internal Medicine

## 2021-05-30 ENCOUNTER — Ambulatory Visit: Payer: Medicare Other | Admitting: Physical Therapy

## 2021-05-30 ENCOUNTER — Other Ambulatory Visit: Payer: Self-pay

## 2021-05-30 ENCOUNTER — Encounter (HOSPITAL_COMMUNITY): Payer: Self-pay

## 2021-05-30 ENCOUNTER — Other Ambulatory Visit: Payer: Medicare Other

## 2021-05-30 DIAGNOSIS — R531 Weakness: Secondary | ICD-10-CM | POA: Diagnosis not present

## 2021-05-30 DIAGNOSIS — Z85818 Personal history of malignant neoplasm of other sites of lip, oral cavity, and pharynx: Secondary | ICD-10-CM | POA: Diagnosis not present

## 2021-05-30 DIAGNOSIS — I350 Nonrheumatic aortic (valve) stenosis: Secondary | ICD-10-CM | POA: Insufficient documentation

## 2021-05-30 DIAGNOSIS — I129 Hypertensive chronic kidney disease with stage 1 through stage 4 chronic kidney disease, or unspecified chronic kidney disease: Secondary | ICD-10-CM | POA: Insufficient documentation

## 2021-05-30 DIAGNOSIS — G9341 Metabolic encephalopathy: Principal | ICD-10-CM | POA: Insufficient documentation

## 2021-05-30 DIAGNOSIS — I2510 Atherosclerotic heart disease of native coronary artery without angina pectoris: Secondary | ICD-10-CM | POA: Insufficient documentation

## 2021-05-30 DIAGNOSIS — I700 Atherosclerosis of aorta: Secondary | ICD-10-CM | POA: Diagnosis not present

## 2021-05-30 DIAGNOSIS — Z8673 Personal history of transient ischemic attack (TIA), and cerebral infarction without residual deficits: Secondary | ICD-10-CM | POA: Insufficient documentation

## 2021-05-30 DIAGNOSIS — R4182 Altered mental status, unspecified: Secondary | ICD-10-CM | POA: Diagnosis not present

## 2021-05-30 DIAGNOSIS — N281 Cyst of kidney, acquired: Secondary | ICD-10-CM | POA: Diagnosis not present

## 2021-05-30 DIAGNOSIS — D472 Monoclonal gammopathy: Secondary | ICD-10-CM | POA: Diagnosis present

## 2021-05-30 DIAGNOSIS — R52 Pain, unspecified: Secondary | ICD-10-CM

## 2021-05-30 DIAGNOSIS — F32A Depression, unspecified: Secondary | ICD-10-CM | POA: Diagnosis present

## 2021-05-30 DIAGNOSIS — R410 Disorientation, unspecified: Secondary | ICD-10-CM | POA: Diagnosis not present

## 2021-05-30 DIAGNOSIS — N179 Acute kidney failure, unspecified: Secondary | ICD-10-CM | POA: Diagnosis present

## 2021-05-30 DIAGNOSIS — J189 Pneumonia, unspecified organism: Secondary | ICD-10-CM | POA: Insufficient documentation

## 2021-05-30 DIAGNOSIS — I712 Thoracic aortic aneurysm, without rupture: Secondary | ICD-10-CM | POA: Diagnosis not present

## 2021-05-30 DIAGNOSIS — E039 Hypothyroidism, unspecified: Secondary | ICD-10-CM | POA: Diagnosis not present

## 2021-05-30 DIAGNOSIS — Z20822 Contact with and (suspected) exposure to covid-19: Secondary | ICD-10-CM | POA: Insufficient documentation

## 2021-05-30 DIAGNOSIS — N1831 Chronic kidney disease, stage 3a: Secondary | ICD-10-CM | POA: Diagnosis not present

## 2021-05-30 DIAGNOSIS — R0902 Hypoxemia: Secondary | ICD-10-CM | POA: Diagnosis not present

## 2021-05-30 DIAGNOSIS — I10 Essential (primary) hypertension: Secondary | ICD-10-CM | POA: Diagnosis not present

## 2021-05-30 DIAGNOSIS — Z87891 Personal history of nicotine dependence: Secondary | ICD-10-CM | POA: Insufficient documentation

## 2021-05-30 DIAGNOSIS — I639 Cerebral infarction, unspecified: Secondary | ICD-10-CM | POA: Diagnosis present

## 2021-05-30 DIAGNOSIS — G9340 Encephalopathy, unspecified: Secondary | ICD-10-CM | POA: Diagnosis present

## 2021-05-30 DIAGNOSIS — F0390 Unspecified dementia without behavioral disturbance: Secondary | ICD-10-CM | POA: Diagnosis not present

## 2021-05-30 DIAGNOSIS — R140 Abdominal distension (gaseous): Secondary | ICD-10-CM | POA: Diagnosis not present

## 2021-05-30 DIAGNOSIS — R29898 Other symptoms and signs involving the musculoskeletal system: Secondary | ICD-10-CM | POA: Diagnosis not present

## 2021-05-30 DIAGNOSIS — K5900 Constipation, unspecified: Secondary | ICD-10-CM | POA: Diagnosis present

## 2021-05-30 DIAGNOSIS — I951 Orthostatic hypotension: Secondary | ICD-10-CM | POA: Diagnosis not present

## 2021-05-30 DIAGNOSIS — R9431 Abnormal electrocardiogram [ECG] [EKG]: Secondary | ICD-10-CM | POA: Diagnosis not present

## 2021-05-30 DIAGNOSIS — I517 Cardiomegaly: Secondary | ICD-10-CM | POA: Diagnosis not present

## 2021-05-30 DIAGNOSIS — Z7982 Long term (current) use of aspirin: Secondary | ICD-10-CM | POA: Diagnosis not present

## 2021-05-30 DIAGNOSIS — Z79899 Other long term (current) drug therapy: Secondary | ICD-10-CM | POA: Diagnosis not present

## 2021-05-30 LAB — RESP PANEL BY RT-PCR (FLU A&B, COVID) ARPGX2
Influenza A by PCR: NEGATIVE
Influenza B by PCR: NEGATIVE
SARS Coronavirus 2 by RT PCR: NEGATIVE

## 2021-05-30 LAB — URINALYSIS, ROUTINE W REFLEX MICROSCOPIC
Bilirubin Urine: NEGATIVE
Glucose, UA: NEGATIVE mg/dL
Hgb urine dipstick: NEGATIVE
Ketones, ur: NEGATIVE mg/dL
Leukocytes,Ua: NEGATIVE
Nitrite: NEGATIVE
Protein, ur: 30 mg/dL — AB
Specific Gravity, Urine: 1.01 (ref 1.005–1.030)
pH: 7 (ref 5.0–8.0)

## 2021-05-30 LAB — CBC WITH DIFFERENTIAL/PLATELET
Abs Immature Granulocytes: 0.04 10*3/uL (ref 0.00–0.07)
Basophils Absolute: 0 10*3/uL (ref 0.0–0.1)
Basophils Relative: 0 %
Eosinophils Absolute: 0 10*3/uL (ref 0.0–0.5)
Eosinophils Relative: 0 %
HCT: 39.8 % (ref 39.0–52.0)
Hemoglobin: 12.8 g/dL — ABNORMAL LOW (ref 13.0–17.0)
Immature Granulocytes: 0 %
Lymphocytes Relative: 6 %
Lymphs Abs: 0.9 10*3/uL (ref 0.7–4.0)
MCH: 31.1 pg (ref 26.0–34.0)
MCHC: 32.2 g/dL (ref 30.0–36.0)
MCV: 96.8 fL (ref 80.0–100.0)
Monocytes Absolute: 1 10*3/uL (ref 0.1–1.0)
Monocytes Relative: 7 %
Neutro Abs: 13.1 10*3/uL — ABNORMAL HIGH (ref 1.7–7.7)
Neutrophils Relative %: 87 %
Platelets: 154 10*3/uL (ref 150–400)
RBC: 4.11 MIL/uL — ABNORMAL LOW (ref 4.22–5.81)
RDW: 15 % (ref 11.5–15.5)
WBC: 15.1 10*3/uL — ABNORMAL HIGH (ref 4.0–10.5)
nRBC: 0 % (ref 0.0–0.2)

## 2021-05-30 LAB — COMPREHENSIVE METABOLIC PANEL
ALT: 17 U/L (ref 0–44)
AST: 20 U/L (ref 15–41)
Albumin: 3.7 g/dL (ref 3.5–5.0)
Alkaline Phosphatase: 33 U/L — ABNORMAL LOW (ref 38–126)
Anion gap: 8 (ref 5–15)
BUN: 27 mg/dL — ABNORMAL HIGH (ref 8–23)
CO2: 29 mmol/L (ref 22–32)
Calcium: 9.2 mg/dL (ref 8.9–10.3)
Chloride: 102 mmol/L (ref 98–111)
Creatinine, Ser: 1.79 mg/dL — ABNORMAL HIGH (ref 0.61–1.24)
GFR, Estimated: 38 mL/min — ABNORMAL LOW (ref >60)
Glucose, Bld: 141 mg/dL — ABNORMAL HIGH (ref 70–99)
Potassium: 4.2 mmol/L (ref 3.5–5.1)
Sodium: 139 mmol/L (ref 135–145)
Total Bilirubin: 0.4 mg/dL (ref 0.3–1.2)
Total Protein: 7.2 g/dL (ref 6.5–8.1)

## 2021-05-30 LAB — PROCALCITONIN: Procalcitonin: 0.13 ng/mL

## 2021-05-30 LAB — URINALYSIS, MICROSCOPIC (REFLEX): RBC / HPF: NONE SEEN RBC/hpf (ref 0–5)

## 2021-05-30 MED ORDER — GABAPENTIN 300 MG PO CAPS
300.0000 mg | ORAL_CAPSULE | Freq: Three times a day (TID) | ORAL | Status: DC
  Administered 2021-05-30 – 2021-05-31 (×2): 300 mg via ORAL
  Filled 2021-05-30 (×2): qty 1

## 2021-05-30 MED ORDER — ROSUVASTATIN CALCIUM 20 MG PO TABS
20.0000 mg | ORAL_TABLET | Freq: Every day | ORAL | Status: DC
  Administered 2021-05-30 – 2021-05-31 (×2): 20 mg via ORAL
  Filled 2021-05-30 (×2): qty 1

## 2021-05-30 MED ORDER — ACETAMINOPHEN 325 MG PO TABS: 650.0000 mg | ORAL_TABLET | Freq: Four times a day (QID) | ORAL | Status: DC | PRN

## 2021-05-30 MED ORDER — LEVOTHYROXINE SODIUM 100 MCG PO TABS
100.0000 ug | ORAL_TABLET | Freq: Every day | ORAL | Status: DC
  Administered 2021-05-31: 100 ug via ORAL
  Filled 2021-05-30: qty 1

## 2021-05-30 MED ORDER — ONDANSETRON HCL 4 MG PO TABS: 4.0000 mg | ORAL_TABLET | Freq: Four times a day (QID) | ORAL | Status: DC | PRN

## 2021-05-30 MED ORDER — SODIUM CHLORIDE 0.9 % IV SOLN: INTRAVENOUS | Status: DC

## 2021-05-30 MED ORDER — ONDANSETRON HCL 4 MG/2ML IJ SOLN: 4.0000 mg | Freq: Four times a day (QID) | INTRAMUSCULAR | Status: DC | PRN

## 2021-05-30 MED ORDER — ACETAMINOPHEN 650 MG RE SUPP: 650.0000 mg | Freq: Four times a day (QID) | RECTAL | Status: DC | PRN

## 2021-05-30 MED ORDER — PYRIDOSTIGMINE BROMIDE 60 MG PO TABS
60.0000 mg | ORAL_TABLET | Freq: Three times a day (TID) | ORAL | Status: DC
  Filled 2021-05-30: qty 1

## 2021-05-30 MED ORDER — FLEET ENEMA 7-19 GM/118ML RE ENEM: 1.0000 | ENEMA | Freq: Once | RECTAL | Status: DC

## 2021-05-30 MED ORDER — FLUDROCORTISONE ACETATE 0.1 MG PO TABS
100.0000 ug | ORAL_TABLET | Freq: Every day | ORAL | Status: DC
  Administered 2021-05-30 – 2021-05-31 (×2): 100 ug via ORAL
  Filled 2021-05-30 (×2): qty 1

## 2021-05-30 MED ORDER — LORATADINE 10 MG PO TABS
10.0000 mg | ORAL_TABLET | Freq: Every day | ORAL | Status: DC
  Administered 2021-05-30 – 2021-05-31 (×2): 10 mg via ORAL
  Filled 2021-05-30 (×2): qty 1

## 2021-05-30 MED ORDER — SODIUM CHLORIDE 0.9 % IV SOLN
1.0000 g | Freq: Once | INTRAVENOUS | Status: AC
  Administered 2021-05-30: 1 g via INTRAVENOUS
  Filled 2021-05-30: qty 10

## 2021-05-30 MED ORDER — TICAGRELOR 90 MG PO TABS
90.0000 mg | ORAL_TABLET | Freq: Two times a day (BID) | ORAL | Status: DC
  Administered 2021-05-30 – 2021-05-31 (×2): 90 mg via ORAL
  Filled 2021-05-30 (×2): qty 1

## 2021-05-30 MED ORDER — PILOCARPINE HCL 5 MG PO TABS
7.5000 mg | ORAL_TABLET | Freq: Three times a day (TID) | ORAL | Status: DC
  Administered 2021-05-30: 7.5 mg via ORAL
  Filled 2021-05-30 (×11): qty 2

## 2021-05-30 MED ORDER — POLYETHYLENE GLYCOL 3350 17 G PO PACK
17.0000 g | PACK | Freq: Two times a day (BID) | ORAL | Status: DC
  Filled 2021-05-30: qty 1

## 2021-05-30 MED ORDER — SODIUM CHLORIDE 0.9 % IV SOLN
3.0000 g | Freq: Three times a day (TID) | INTRAVENOUS | Status: DC
  Administered 2021-05-30 – 2021-05-31 (×2): 3 g via INTRAVENOUS
  Filled 2021-05-30 (×14): qty 8

## 2021-05-30 MED ORDER — LORAZEPAM 1 MG PO TABS
1.0000 mg | ORAL_TABLET | Freq: Three times a day (TID) | ORAL | Status: DC
  Administered 2021-05-31: 1 mg via ORAL
  Filled 2021-05-30: qty 1

## 2021-05-30 MED ORDER — HEPARIN SODIUM (PORCINE) 5000 UNIT/ML IJ SOLN
5000.0000 [IU] | Freq: Three times a day (TID) | INTRAMUSCULAR | Status: DC
  Administered 2021-05-30 – 2021-05-31 (×2): 5000 [IU] via SUBCUTANEOUS
  Filled 2021-05-30 (×2): qty 1

## 2021-05-30 MED ORDER — SODIUM CHLORIDE 0.9 % IV SOLN
500.0000 mg | Freq: Once | INTRAVENOUS | Status: AC
  Administered 2021-05-30: 500 mg via INTRAVENOUS
  Filled 2021-05-30: qty 500

## 2021-05-30 MED ORDER — LORAZEPAM 1 MG PO TABS
1.0000 mg | ORAL_TABLET | Freq: Three times a day (TID) | ORAL | Status: DC | PRN
  Administered 2021-05-30: 1 mg via ORAL
  Filled 2021-05-30: qty 1

## 2021-05-30 MED ORDER — ASPIRIN EC 81 MG PO TBEC
81.0000 mg | DELAYED_RELEASE_TABLET | Freq: Every day | ORAL | Status: DC
  Administered 2021-05-30 – 2021-05-31 (×2): 81 mg via ORAL
  Filled 2021-05-30 (×2): qty 1

## 2021-05-30 NOTE — ED Notes (Signed)
Attempted to call report to floor nurse without success.

## 2021-05-30 NOTE — ED Notes (Signed)
Patient to MRI.
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
## 2021-05-30 NOTE — Progress Notes (Signed)
Pharmacy Antibiotic Note  George Bradley is a 80 y.o. male admitted on 05/30/2021 with aspiration pneumonia.  Pharmacy has been consulted for Unasyn dosing.  Plan: Ampicillin-sulbactam 3gm IV q8h F/u for deescalation Monitor clinical course.   Height: '6\' 1"'$  (185.4 cm) Weight: 78.6 kg (173 lb 4.5 oz) IBW/kg (Calculated) : 79.9  Temp (24hrs), Avg:97.9 F (36.6 C), Min:97.9 F (36.6 C), Max:97.9 F (36.6 C)  Recent Labs  Lab 05/30/21 1037  WBC 15.1*  CREATININE 1.79*    Estimated Creatinine Clearance: 36.6 mL/min (A) (by C-G formula based on SCr of 1.79 mg/dL (H)).    Allergies  Allergen Reactions   Zetia [Ezetimibe] Other (See Comments)    "made me feel bad"    Antimicrobials this admission: 9/8 Ceftriaxone x 1 dose 9/8 Azithromycin x 1 dose     Jazmon Kos A. Levada Dy, PharmD, BCPS, FNKF Clinical Pharmacist [place] Please utilize Amion for appropriate phone number to reach the unit pharmacist ([place])  05/30/2021 8:24 PM
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
## 2021-05-30 NOTE — ED Notes (Signed)
Patient to bathroom with staff assistance at this time.

## 2021-05-30 NOTE — ED Triage Notes (Signed)
Pt brought to ED via [facility] for AMS. Pt took doxipen and amitripyline last night, first time he had taken the doxipen. Wife said he was confused when he woke up this am, unable to stand fully. Pt now fully alert and oriented x 4

## 2021-05-31 DIAGNOSIS — D472 Monoclonal gammopathy: Secondary | ICD-10-CM | POA: Diagnosis not present

## 2021-05-31 DIAGNOSIS — N179 Acute kidney failure, unspecified: Secondary | ICD-10-CM

## 2021-05-31 DIAGNOSIS — F0390 Unspecified dementia without behavioral disturbance: Secondary | ICD-10-CM | POA: Diagnosis not present

## 2021-05-31 DIAGNOSIS — G9340 Encephalopathy, unspecified: Secondary | ICD-10-CM | POA: Diagnosis not present

## 2021-05-31 DIAGNOSIS — G9341 Metabolic encephalopathy: Secondary | ICD-10-CM | POA: Diagnosis not present

## 2021-05-31 DIAGNOSIS — R4182 Altered mental status, unspecified: Secondary | ICD-10-CM

## 2021-05-31 DIAGNOSIS — Z20822 Contact with and (suspected) exposure to covid-19: Secondary | ICD-10-CM | POA: Diagnosis not present

## 2021-05-31 LAB — CBC
HCT: 40.6 % (ref 39.0–52.0)
Hemoglobin: 13 g/dL (ref 13.0–17.0)
MCH: 30.9 pg (ref 26.0–34.0)
MCHC: 32 g/dL (ref 30.0–36.0)
MCV: 96.4 fL (ref 80.0–100.0)
Platelets: 160 10*3/uL (ref 150–400)
RBC: 4.21 MIL/uL — ABNORMAL LOW (ref 4.22–5.81)
RDW: 15.1 % (ref 11.5–15.5)
WBC: 9.2 10*3/uL (ref 4.0–10.5)
nRBC: 0 % (ref 0.0–0.2)

## 2021-05-31 LAB — BASIC METABOLIC PANEL
Anion gap: 1 — ABNORMAL LOW (ref 5–15)
BUN: 20 mg/dL (ref 8–23)
CO2: 33 mmol/L — ABNORMAL HIGH (ref 22–32)
Calcium: 8.5 mg/dL — ABNORMAL LOW (ref 8.9–10.3)
Chloride: 106 mmol/L (ref 98–111)
Creatinine, Ser: 1.09 mg/dL (ref 0.61–1.24)
GFR, Estimated: >60 mL/min (ref >60)
Glucose, Bld: 97 mg/dL (ref 70–99)
Potassium: 3.3 mmol/L — ABNORMAL LOW (ref 3.5–5.1)
Sodium: 140 mmol/L (ref 135–145)

## 2021-05-31 LAB — PROCALCITONIN: Procalcitonin: <0.1 ng/mL

## 2021-05-31 MED ORDER — POTASSIUM CHLORIDE CRYS ER 20 MEQ PO TBCR
40.0000 meq | EXTENDED_RELEASE_TABLET | Freq: Once | ORAL | Status: AC
  Administered 2021-05-31: 40 meq via ORAL
  Filled 2021-05-31: qty 2

## 2021-05-31 MED ORDER — AMOXICILLIN-POT CLAVULANATE 875-125 MG PO TABS: 1.0000 | ORAL_TABLET | Freq: Two times a day (BID) | ORAL | 0 refills | Status: DC

## 2021-05-31 NOTE — TOC Transition Note (Deleted)
Transition of Care [facility]) - CM/SW Discharge Note   Patient Details  Name: FREDDICK MAJKUT MRN: [id number] Date of Birth: [birth year]  Transition of Care [facility]) CM/SW Contact:  Natasha Bence, LCSW Phone Number: 05/31/2021, 11:26 AM   Clinical Narrative:    CSW notified of patient's readiness for discharge. CSW placed order for hospital bed with [facility] Caryl Pina agreeable to provide Hospital bed. CSW referred patient to [place] with [name]. Georgina Snell agreeable to provide [facility] services. TOC signing off.    Final next level of care: [place] Barriers to Discharge: Barriers Resolved   Patient Goals and CMS Choice Patient states their goals for this hospitalization and ongoing recovery are:: Return Home with [facility] CMS Medicare.gov Compare Post Acute Care list provided to:: Patient Choice offered to / list presented to : Patient  Discharge Placement                    Patient and family notified of of transfer: 05/31/21  Discharge Plan and Services                DME Arranged: Hospital bed DME Agency: [employer] Date DME Agency Contacted: 05/31/21 Time DME Agency Contacted: [id number] Representative spoke with at DME Agency: Caryl Pina HH Arranged: RN, PT [facility] Agency: [place] Date [place]: 05/31/21 Time [place]: 1121 Representative spoke with at [place]: [place] ([place]) Interventions     Readmission Risk Interventions No flowsheet data found.

## 2021-05-31 NOTE — Discharge Summary (Signed)
Physician Discharge Summary  George Bradley [id number] DOB: 1940-09-27 DOA: 05/30/2021  PCP: [name], MD  Admit date: 05/30/2021 Discharge date: 05/31/2021  Admitted From: Home Disposition:  Home   Recommendations for Outpatient Follow-up:  Follow up with PCP in 1-2 weeks Please obtain BMP/CBC in one week     Discharge Condition: Stable CODE STATUS:DNR Diet recommendation: Heart Healthy / Carb Modified    Brief/Interim Summary: 80 year old male with a history of stroke, right carotid stenosis, coronary disease, CKD stage III, hyperlipidemia, oropharyngeal cancer/tongue cancer, hypothyroidism, cognitive impairment, MGUS, orthostatic hypotension, insomnia presenting with altered mental status.  In the early morning of 05/30/2021, the patient was noted to be getting out of bed.  His wife noted that he was confused and the patient had urinated on himself.  She struggled to get him back in bed.  Later in the morning, the patient continued to be confused, drowsy, and fall asleep without any stimulation.  There was some concern that the patient had some leg weakness, but he did not fall or hit his head.  Notably, the patient recently saw his neurologist, Dr. Arlice Colt, who started the patient on pyridostigmine for orthostatic hypotension as well as doxepin for the patient's insomnia.  There is no other new medications.  On the evening of 05/29/2021, the patient took his first dose of doxepin in addition to amitriptyline to help him sleep.  The patient spouse relates that the patient does not take hypnotics on a nightly basis, but on multiple nights per week, the patient does intermittently take amitriptyline, Ativan, Ambien.  She states that all of these medications have helped only in some capacity to help inflate.  He had not taken amitriptyline for about a week until the evening of 05/29/2021 when he took it with his doxepin.  She states that he had not taken any Ambien in about a week.  He has  also tried Seroquel in the past, but is no longer taking his medication.  Nevertheless, because of the patient's confusion he was brought to emergency department for further evaluation.  There is been no history of fevers, chills, headache, chest pain, shortness breath, coughing, hemoptysis, nausea, vomiting, diarrhea, abdominal pain, dysuria, hematuria. In the emergency department, the patient was afebrile hemodynamically stable with oxygen saturation 95% on room air.  BMP showed sodium 139, potassium 4.2, serum creatinine 1.79, bicarbonate 29.  WBC 15.1, hemoglobin 12.8, platelets 154,000.  UA was negative for pyuria.  Chest x-ray showed bibasilar opacities. CT of the brain was negative for acute findings.  MRI brain was negative for acute findings.  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  There is bilateral lower lobe GGO.  CT of the abdomen was negative for acute findings.  The patient was started on IV fluids and IV Unasyn. His mental status improved, and he returned to his usual baseline.  His renal function improved back to his usual baseline with IV fluids.  WBC improved to 9.2 on the day of discharge.  Serum creatinine 1.09 on day of discharge.  The patient was sent home with 5 additional days of Augmentin.  Notably, the patient has had numerous hospital admissions for variety of reasons.  His most recent hospitalization was from 04/27/2021 to 04/30/2021 when he presented with left upper extremity weakness and slurred speech.  MRI showed multiple punctate acute infarctions in the right cortical brain.  He noted to have right internal carotid occlusion.  He was followed by stroke neurology  as well as IR.  After discussion with IR, the patient and spouse opted for conservative therapy and expectant management.  They were told to continue aspirin and Brilinta and to have repeat CTA of the neck in 1 to 2 months.  The patient initially had a right internal carotid artery stent placed  in March 2022.  In April 2022, the patient had a CTA of the head and neck which showed 50% stenosis of the right ICA stent.  He followed up with IR, Dr. Norma Fredrickson in July 2022 and carotid Doppler was unremarkable.  Discharge Diagnoses:  Acute toxic/metabolic encephalopathy -Multifactorial including acute kidney injury, aspiration pneumonitis, and hypnotic medications -Discussed the risk, benefits, and alternatives of his multiple hypnotic medications for his insomnia--wife and patient expressed understanding, but will continue his current usage--> I have encouraged the patient to follow-up with his PCP for further discussions -Discussed importance of sleep hygiene as the patient has numerous naps during the day -05/31/2021--mental status back to baseline -UA negative for pyuria -MRI brain negative for acute findings  Aspiration pneumonitis -CT chest with bilateral lower lobe GGO -Initially started on Unasyn -Discharged home with Augmentin x5 more days  Acute kidney injury on CKD 3A -Baseline creatinine 1.0-1.2 -Presented with serum creatinine 1.79 -Improved with IV fluids -Serum creatinine 1.09 on day of discharge  Orthostatic hypotension -Continue home dose Florinef -Continue pyridostigmine under the guidance of his neurologist  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 Heath Lark  Dementia without behavioral disturbance -continue aricept  Peripheral neuropathy -Continue gabapentin  Hypothyroidism -continue synthroid  Hypokalemia -repleted   Discharge Instructions   Allergies as of 05/31/2021       Reactions   Zetia [ezetimibe] Other (See Comments)   "made me feel bad"        Medication List     STOP taking these medications    busPIRone 5  MG tablet Commonly known as: BUSPAR   indomethacin 25 MG capsule Commonly known as: INDOCIN   zolpidem 10 MG tablet Commonly known as: AMBIEN       TAKE these medications    amitriptyline 25 MG tablet Commonly known as: ELAVIL Take 25 mg by mouth at bedtime.   amLODipine 5 MG tablet Commonly known as: NORVASC Take 0.5 tablets (2.5 mg total) by mouth daily as needed (blood pressure on the higher end, no parameter).   amoxicillin-clavulanate 875-125 MG tablet Commonly known as: Augmentin Take 1 tablet by mouth 2 (two) times daily.   aspirin 81 MG EC tablet Take 1 tablet (81 mg total) by mouth daily. Swallow whole.   Brilinta 90 MG Tabs tablet Generic drug: ticagrelor Take 90 mg by mouth 2 (two) times daily.   cetirizine 10 MG tablet Commonly known as: ZYRTEC Take 10 mg by mouth daily.   donepezil 5 MG tablet Commonly known as: ARICEPT Take 5 mg by mouth at bedtime.   doxepin 10 MG capsule Commonly known as: SINEQUAN Take 1 capsule (10 mg total) by mouth at bedtime.   fludrocortisone 0.1 MG tablet Commonly known as: FLORINEF Take 100 mcg by mouth daily.   gabapentin 300 MG capsule Commonly known as: NEURONTIN TAKE 3 CAPSULES(900 MG) BY MOUTH THREE TIMES DAILY   levothyroxine 100 MCG tablet Commonly known as: SYNTHROID TAKE ONE TABLET BY MOUTH ONCE DAILY BEFORE  BREAKFAST What changed: See the new instructions.   LORazepam 1 MG tablet Commonly known as: ATIVAN Take 1 tablet (1 mg total) by mouth every 8 (eight) hours as needed for anxiety.   pilocarpine 7.5 MG tablet Commonly known as: SALAGEN Take 7.5 mg by mouth 3 (three) times daily.   pyridostigmine 60 MG tablet Commonly known as: Mestinon Take 1 tablet (60 mg total) by mouth 3 (three) times daily.   Repatha SureClick [id number] MG/ML Soaj Generic drug: Evolocumab Inject 140 mg into the skin every 14 (fourteen) days.   rosuvastatin 20 MG tablet Commonly known as: CRESTOR Take 1 tablet (20 mg total)  by mouth daily.   traMADol 50 MG tablet Commonly known as: ULTRAM Take 1 tablet (50 mg total) by mouth 2 (two) times daily as needed for moderate pain.               Durable Medical Equipment  (From admission, onward)           Start     Ordered   05/31/21 0729  For home use only DME Hospital bed  Once       Question Answer Comment  Length of Need 6 Months   Patient has (list medical condition): gait instability, dementia, falls   Bed type Semi-electric      05/31/21 0729            Allergies  Allergen Reactions   Zetia [Ezetimibe] Other (See Comments)    "made me feel bad"    Consultations: none   Procedures/Studies: CT Head Wo Contrast  Result Date: 05/30/2021 CLINICAL DATA:  Mental status changes of unknown cause. EXAM: CT HEAD WITHOUT CONTRAST TECHNIQUE: Contiguous axial images were obtained from the base of the skull through the vertex without intravenous contrast. COMPARISON:  Comparison made with MRI of April 29, 2021 and CT of April 27, 2021. FINDINGS: Brain: No evidence of acute infarction, hemorrhage, hydrocephalus, extra-axial collection or mass lesion/mass effect. Signs of atrophy and chronic microvascular ischemic change as before. Vascular: No hyperdense vessel or unexpected calcification. Skull: Normal. Negative for fracture or focal lesion. Sinuses/Orbits: Visualized paranasal sinuses and orbits are unremarkable. Other: None IMPRESSION: No acute intracranial pathology. Signs of atrophy and chronic microvascular ischemic change as before. Electronically Signed   By: Zetta Bills M.D.   On: 05/30/2021 11:48   MR Brain Wo Contrast (neuro protocol)  Result Date: 05/30/2021 CLINICAL DATA:  Altered mental status, right-sided weakness EXAM: MRI HEAD WITHOUT CONTRAST TECHNIQUE: Multiplanar, multiecho pulse sequences of the brain and surrounding structures were obtained without intravenous contrast. COMPARISON:  Same day CT head FINDINGS: Brain: There is no  evidence of acute intracranial hemorrhage, extra-axial fluid collection, or acute infarct. There is a remote infarct in the left occipital lobe small foci of cortically based FLAIR signal abnormality most notable in the right frontal lobe are consistent with evolving infarcts as seen on the prior MRI from 04/29/2021. There is a small remote lacunar infarct in the left cerebellar hemisphere. There is mild parenchymal volume loss. Scattered additional of FLAIR signal abnormality in the subcortical and periventricular white matter likely reflect mild chronic white matter microangiopathy. There is no suspicious parenchymal signal abnormality. There is no mass lesion. There is no midline shift. Vascular: The right ICA flow void is absent. The ACA and MCA flow voids are present, presumably from collateral flow through the anterior communicating artery. This is in keeping with the findings on the CTA head from 04/27/2021 which demonstrated ICA  occlusion. Skull and upper cervical spine: Normal marrow signal. Sinuses/Orbits: The paranasal sinuses are clear. Bilateral lens implants are in place. The globes and orbits are otherwise unremarkable. Other: There is a small right mastoid effusion, unchanged. IMPRESSION: 1. No acute intracranial pathology. 2. Unchanged remote infarct in the left occipital lobe and mild chronic white matter microangiopathy. 3. Absent right intracranial ICA flow void with reconstitution of the ACA and MCA, in keeping with the findings on the CTA head from 04/29/2021. Electronically Signed   By: Valetta Mole M.D.   On: 05/30/2021 13:58   DG Chest Port 1 View  Result Date: 05/30/2021 CLINICAL DATA:  Altered mental status. EXAM: PORTABLE CHEST 1 VIEW COMPARISON:  Chest x-ray dated April 28, 2021. FINDINGS: Unchanged mild cardiomegaly. Normal pulmonary vascularity. Retrocardiac left lower lobe opacity. No pleural effusion or pneumothorax. No acute osseous abnormality. IMPRESSION: 1. Retrocardiac left  lower lobe atelectasis versus infiltrate. Electronically Signed   By: Titus Dubin M.D.   On: 05/30/2021 11:37   CT CHEST ABDOMEN PELVIS WO CONTRAST  Result Date: 05/30/2021 CLINICAL DATA:  Abdominal distension AKI new EXAM: CT CHEST, ABDOMEN AND PELVIS WITHOUT CONTRAST TECHNIQUE: Multidetector CT imaging of the chest, abdomen and pelvis was performed following the standard protocol without IV contrast. COMPARISON:  None. FINDINGS: Chest: Cardiovascular: Ascending thoracic aorta measures up to 4.0 x 3.8 cm. Diffuse atherosclerotic calcifications. Coronary artery calcifications. Normal heart size. No pericardial effusion. Mediastinum/Nodes: No enlarged mediastinal, hilar, or axillary lymph nodes. The thyroid gland appears normal. Lungs/Pleura: No pleural effusion. No pneumothorax. Ground-glass consolidation in the dependent bilateral lower lobes. No suspicious pulmonary nodules. Abdomen: Hepatobiliary: The liver is normal in size without focal abnormality. No intrahepatic or extrahepatic biliary ductal dilation. The gallbladder appears normal. Spleen: Normal in size without focal abnormality. Pancreas: No pancreatic ductal dilatation or surrounding inflammatory changes. Adrenals/Urinary Tract: Adrenal glands are unremarkable. Kidneys are normal, without renal calculi or hydronephrosis. Right simple renal cyst. Bladder is unremarkable. Stomach/Bowel: The stomach, small bowel and large bowel are normal in caliber without abnormal wall thickening or surrounding inflammatory changes. Stool ball in the rectal vault measures up to 8 cm. Reproductive: Prostate is unremarkable. Lymphatic: No enlarged lymph nodes in the abdomen or pelvis. Vasculature: The abdominal aorta is normal in caliber. Atherosclerotic calcifications, particularly of the femoral vessels. Other: No abdominopelvic ascites. Musculoskeletal: No aggressive osseous lesions. Multilevel degenerative changes of spine. Median sternotomy wires. The soft  tissues are unremarkable. IMPRESSION: Chest: 1. Ground-glass consolidation in the dependent lower lobes, likely atelectasis. However, aspiration/pneumonia remains a differential consideration. 2. Ascending thoracic aorta measures up to 4.0 cm. Recommend annual imaging followup by CTA or MRA. This recommendation follows 2010 ACCF/AHA/AATS/ACR/ASA/SCA/SCAI/SIR/STS/SVM Guidelines for the Diagnosis and Management of Patients with Thoracic Aortic Disease. Circulation. 2010; [id number]. Aortic aneurysm NOS (ICD10-I71.9) 3. Coronary artery calcifications. Abdomen/pelvis: 1. Stool ball in the rectum measures up to 8 cm. No other acute findings in the abdomen or pelvis. Electronically Signed   By: Albin Felling M.D.   On: 05/30/2021 14:47        Discharge Exam: Vitals:   05/30/21 2353 05/31/21 0443  BP: 132/65 (!) 165/91  Pulse: 87 77  Resp: 18 18  Temp: 98.7 F (37.1 C) 98.1 F (36.7 C)  SpO2: 92% 95%   Vitals:   05/30/21 1600 05/30/21 1948 05/30/21 2353 05/31/21 0443  BP: 123/63  132/65 (!) 165/91  Pulse: 83  87 77  Resp: '18  18 18  '$ Temp:   98.7 F (  37.1 C) 98.1 F (36.7 C)  TempSrc:   Oral Oral  SpO2: 91%  92% 95%  Weight:  78.6 kg    Height:  '6\' 1"'$  (1.854 m)      General: Pt is alert, awake, not in acute distress Cardiovascular: RRR, S1/S2 +, no rubs, no gallops Respiratory: bibasilar rales R>L Abdominal: Soft, NT, ND, bowel sounds + Extremities: no edema, no cyanosis Neuro:  CN II-XII intact, strength 4/5 in RUE, RLE, strength 4/5 LUE, LLE; sensation intact bilateral; no dysmetria; babinski equivocal    The results of significant diagnostics from this hospitalization (including imaging, microbiology, ancillary and laboratory) are listed below for reference.    Significant Diagnostic Studies: CT Head Wo Contrast  Result Date: 05/30/2021 CLINICAL DATA:  Mental status changes of unknown cause. EXAM: CT HEAD WITHOUT CONTRAST TECHNIQUE: Contiguous axial images were obtained  from the base of the skull through the vertex without intravenous contrast. COMPARISON:  Comparison made with MRI of April 29, 2021 and CT of April 27, 2021. FINDINGS: Brain: No evidence of acute infarction, hemorrhage, hydrocephalus, extra-axial collection or mass lesion/mass effect. Signs of atrophy and chronic microvascular ischemic change as before. Vascular: No hyperdense vessel or unexpected calcification. Skull: Normal. Negative for fracture or focal lesion. Sinuses/Orbits: Visualized paranasal sinuses and orbits are unremarkable. Other: None IMPRESSION: No acute intracranial pathology. Signs of atrophy and chronic microvascular ischemic change as before. Electronically Signed   By: Zetta Bills M.D.   On: 05/30/2021 11:48   MR Brain Wo Contrast (neuro protocol)  Result Date: 05/30/2021 CLINICAL DATA:  Altered mental status, right-sided weakness EXAM: MRI HEAD WITHOUT CONTRAST TECHNIQUE: Multiplanar, multiecho pulse sequences of the brain and surrounding structures were obtained without intravenous contrast. COMPARISON:  Same day CT head FINDINGS: Brain: There is no evidence of acute intracranial hemorrhage, extra-axial fluid collection, or acute infarct. There is a remote infarct in the left occipital lobe small foci of cortically based FLAIR signal abnormality most notable in the right frontal lobe are consistent with evolving infarcts as seen on the prior MRI from 04/29/2021. There is a small remote lacunar infarct in the left cerebellar hemisphere. There is mild parenchymal volume loss. Scattered additional of FLAIR signal abnormality in the subcortical and periventricular white matter likely reflect mild chronic white matter microangiopathy. There is no suspicious parenchymal signal abnormality. There is no mass lesion. There is no midline shift. Vascular: The right ICA flow void is absent. The ACA and MCA flow voids are present, presumably from collateral flow through the anterior communicating  artery. This is in keeping with the findings on the CTA head from 04/27/2021 which demonstrated ICA occlusion. Skull and upper cervical spine: Normal marrow signal. Sinuses/Orbits: The paranasal sinuses are clear. Bilateral lens implants are in place. The globes and orbits are otherwise unremarkable. Other: There is a small right mastoid effusion, unchanged. IMPRESSION: 1. No acute intracranial pathology. 2. Unchanged remote infarct in the left occipital lobe and mild chronic white matter microangiopathy. 3. Absent right intracranial ICA flow void with reconstitution of the ACA and MCA, in keeping with the findings on the CTA head from 04/29/2021. Electronically Signed   By: Valetta Mole M.D.   On: 05/30/2021 13:58   DG Chest Port 1 View  Result Date: 05/30/2021 CLINICAL DATA:  Altered mental status. EXAM: PORTABLE CHEST 1 VIEW COMPARISON:  Chest x-ray dated April 28, 2021. FINDINGS: Unchanged mild cardiomegaly. Normal pulmonary vascularity. Retrocardiac left lower lobe opacity. No pleural effusion or pneumothorax. No acute osseous  abnormality. IMPRESSION: 1. Retrocardiac left lower lobe atelectasis versus infiltrate. Electronically Signed   By: Titus Dubin M.D.   On: 05/30/2021 11:37   CT CHEST ABDOMEN PELVIS WO CONTRAST  Result Date: 05/30/2021 CLINICAL DATA:  Abdominal distension AKI new EXAM: CT CHEST, ABDOMEN AND PELVIS WITHOUT CONTRAST TECHNIQUE: Multidetector CT imaging of the chest, abdomen and pelvis was performed following the standard protocol without IV contrast. COMPARISON:  None. FINDINGS: Chest: Cardiovascular: Ascending thoracic aorta measures up to 4.0 x 3.8 cm. Diffuse atherosclerotic calcifications. Coronary artery calcifications. Normal heart size. No pericardial effusion. Mediastinum/Nodes: No enlarged mediastinal, hilar, or axillary lymph nodes. The thyroid gland appears normal. Lungs/Pleura: No pleural effusion. No pneumothorax. Ground-glass consolidation in the dependent bilateral  lower lobes. No suspicious pulmonary nodules. Abdomen: Hepatobiliary: The liver is normal in size without focal abnormality. No intrahepatic or extrahepatic biliary ductal dilation. The gallbladder appears normal. Spleen: Normal in size without focal abnormality. Pancreas: No pancreatic ductal dilatation or surrounding inflammatory changes. Adrenals/Urinary Tract: Adrenal glands are unremarkable. Kidneys are normal, without renal calculi or hydronephrosis. Right simple renal cyst. Bladder is unremarkable. Stomach/Bowel: The stomach, small bowel and large bowel are normal in caliber without abnormal wall thickening or surrounding inflammatory changes. Stool ball in the rectal vault measures up to 8 cm. Reproductive: Prostate is unremarkable. Lymphatic: No enlarged lymph nodes in the abdomen or pelvis. Vasculature: The abdominal aorta is normal in caliber. Atherosclerotic calcifications, particularly of the femoral vessels. Other: No abdominopelvic ascites. Musculoskeletal: No aggressive osseous lesions. Multilevel degenerative changes of spine. Median sternotomy wires. The soft tissues are unremarkable. IMPRESSION: Chest: 1. Ground-glass consolidation in the dependent lower lobes, likely atelectasis. However, aspiration/pneumonia remains a differential consideration. 2. Ascending thoracic aorta measures up to 4.0 cm. Recommend annual imaging followup by CTA or MRA. This recommendation follows 2010 ACCF/AHA/AATS/ACR/ASA/SCA/SCAI/SIR/STS/SVM Guidelines for the Diagnosis and Management of Patients with Thoracic Aortic Disease. Circulation. 2010; [id number]. Aortic aneurysm NOS (ICD10-I71.9) 3. Coronary artery calcifications. Abdomen/pelvis: 1. Stool ball in the rectum measures up to 8 cm. No other acute findings in the abdomen or pelvis. Electronically Signed   By: Albin Felling M.D.   On: 05/30/2021 14:47    Microbiology: Recent Results (from the past 240 hour(s))  Resp Panel by RT-PCR (Flu A&B, Covid)  Nasopharyngeal Swab     Status: None   Collection Time: 05/30/21  1:06 PM   Specimen: Nasopharyngeal Swab; Nasopharyngeal(NP) swabs in vial transport medium  Result Value Ref Range Status   SARS Coronavirus 2 by RT PCR NEGATIVE NEGATIVE Final    Comment: (NOTE) SARS-CoV-2 target nucleic acids are NOT DETECTED.  The SARS-CoV-2 RNA is generally detectable in upper respiratory specimens during the acute phase of infection. The lowest concentration of SARS-CoV-2 viral copies this assay can detect is 138 copies/mL. A negative result does not preclude SARS-Cov-2 infection and should not be used as the sole basis for treatment or other patient management decisions. A negative result may occur with  improper specimen collection/handling, submission of specimen other than nasopharyngeal swab, presence of viral mutation(s) within the areas targeted by this assay, and inadequate number of viral copies(<138 copies/mL). A negative result must be combined with clinical observations, patient history, and epidemiological information. The expected result is Negative.  Fact Sheet for Patients:  EntrepreneurPulse.com.au  Fact Sheet for Healthcare Providers:  IncredibleEmployment.be  This test is no t yet approved or cleared by the [place] FDA and  has been authorized for detection and/or diagnosis of SARS-CoV-2 by FDA under  an Emergency Use Authorization (EUA). This EUA will remain  in effect (meaning this test can be used) for the duration of the COVID-19 declaration under Section 564(b)(1) of the Act, 21 U.S.C.section 360bbb-3(b)(1), unless the authorization is terminated  or revoked sooner.       Influenza A by PCR NEGATIVE NEGATIVE Final   Influenza B by PCR NEGATIVE NEGATIVE Final    Comment: (NOTE) The Xpert Xpress SARS-CoV-2/FLU/RSV plus assay is intended as an aid in the diagnosis of influenza from Nasopharyngeal swab specimens and should not be  used as a sole basis for treatment. Nasal washings and aspirates are unacceptable for Xpert Xpress SARS-CoV-2/FLU/RSV testing.  Fact Sheet for Patients: EntrepreneurPulse.com.au  Fact Sheet for Healthcare Providers: IncredibleEmployment.be  This test is not yet approved or cleared by the [place] FDA and has been authorized for detection and/or diagnosis of SARS-CoV-2 by FDA under an Emergency Use Authorization (EUA). This EUA will remain in effect (meaning this test can be used) for the duration of the COVID-19 declaration under Section 564(b)(1) of the Act, 21 U.S.C. section 360bbb-3(b)(1), unless the authorization is terminated or revoked.  Performed at [facility] [address], [place], [place] [address]      Labs: Basic Metabolic Panel: Recent Labs  Lab 05/30/21 1037 05/31/21 0610  NA 139 140  K 4.2 3.3*  CL 102 106  CO2 29 33*  GLUCOSE 141* 97  BUN 27* 20  CREATININE 1.79* 1.09  CALCIUM 9.2 8.5*   Liver Function Tests: Recent Labs  Lab 05/30/21 1037  AST 20  ALT 17  ALKPHOS 33*  BILITOT 0.4  PROT 7.2  ALBUMIN 3.7   No results for input(s): LIPASE, AMYLASE in the last 168 hours. No results for input(s): AMMONIA in the last 168 hours. CBC: Recent Labs  Lab 05/30/21 1037 05/31/21 0610  WBC 15.1* 9.2  NEUTROABS 13.1*  --   HGB 12.8* 13.0  HCT 39.8 40.6  MCV 96.8 96.4  PLT 154 160   Cardiac Enzymes: No results for input(s): CKTOTAL, CKMB, CKMBINDEX, TROPONINI in the last 168 hours. BNP: Invalid input(s): POCBNP CBG: No results for input(s): GLUCAP in the last 168 hours.  Time coordinating discharge:  36 minutes  Signed:  Orson Eva, DO [facility] Pager: [phone] 05/31/2021, 8:03 AM

## 2021-05-31 NOTE — TOC Progression Note (Signed)
Transition of Care [facility]) - Progression Note    Patient Details  Name: George Bradley MRN: [id number] Date of Birth: 12-07-1940  [facility]) CM/SW Contact  Natasha Bence, LCSW Phone Number: 05/31/2021, 11:15 AM  Clinical Narrative:    The patient has a medical condition which requires positioning of the body in ways not feasible with an ordinary bed.         Expected Discharge Plan and Services           Expected Discharge Date: 05/31/21                                     Social Determinants of Health (SDOH) Interventions    Readmission Risk Interventions No flowsheet data found.

## 2021-05-31 NOTE — TOC Transition Note (Addendum)
Transition of Care [facility]) - CM/SW Discharge Note   Patient Details  Name: BOAZ MOSKO MRN: [id number] Date of Birth: 14-Oct-1940  Transition [facility]) CM/SW Contact:  Natasha Bence, LCSW Phone Number: 05/31/2021, 3:04 PM   Clinical Narrative:    CSW notified of patient's readiness for discharge and patient's request for DME and HH. CSW placed order for hospital bed with [facility] Caryl Pina agreeable to provide Hospital bed. CSW referred patient to [place] with [name]. Georgina Snell agreeable to provide [facility] services. TOC signing off.    Final next level of care: [place] Barriers to Discharge: Barriers Resolved   Patient Goals and CMS Choice Patient states their goals for this hospitalization and ongoing recovery are:: Return Home with [facility] CMS Medicare.gov Compare Post Acute Care list provided to:: Patient Choice offered to / list presented to : Patient  Discharge Placement                    Patient and family notified of of transfer: 05/31/21  Discharge Plan and Services                DME Arranged: Hospital bed DME Agency: [facility] Date DME Agency Contacted: 05/31/21 Time DME Agency Contacted: 1120 Representative spoke with at [place]: [place]) Interventions     Readmission Risk Interventions No flowsheet data found.

## 2021-06-03 ENCOUNTER — Telehealth: Payer: Self-pay | Admitting: Neurology

## 2021-06-03 ENCOUNTER — Ambulatory Visit: Payer: Medicare Other | Admitting: Physical Therapy

## 2021-06-03 NOTE — Telephone Encounter (Signed)
Wife is asking for a call to discuss medications pt was recently put on before most recent stay in hospital & scans done while in hospital that were recently ordered.

## 2021-06-04 MED ORDER — PYRIDOSTIGMINE BROMIDE 60 MG PO TABS: 30.0000 mg | ORAL_TABLET | Freq: Three times a day (TID) | ORAL | 11 refills | Status: DC

## 2021-06-04 NOTE — Telephone Encounter (Signed)
Called the patient's wife back. Advised the wife that I talked with Dr. Felecia Shelling about her concerns.  Advised that Dr. Felecia Shelling recommends she still pursue having him complete the imaging that is ordered and scheduled.  Advised the patient's wife the patient should stop the doxepin and the Mestinon should be decreased to 1/2 tablet 3 times a day.

## 2021-06-04 NOTE — Addendum Note (Signed)
Addended by: Darleen Crocker on: 06/04/2021 03:15 PM   Modules accepted: Orders

## 2021-06-04 NOTE — Telephone Encounter (Signed)
Pt's wife agreed with the plan and was appreciative for the call back.

## 2021-06-04 NOTE — Telephone Encounter (Signed)
Called the patient's wife.  Patient saw Dr. Felecia Shelling September 6th at that time was ordered to have a CT head and neck done with and without contrast and was prescribed doxepin for sleep and Mestinon for MG. Wife stated that on Wednesday night patient took 1 pill of each and on Thursday morning she had difficulty with trying to wake the patient, he was exhibiting signs of left-sided weakness, could not sit up, she noted shaking in the left leg intermittently only lasting 45 seconds or so.   Pt was incoherent, he could not stand on his feet, he had urinated all over himself, very drowsy, sleeping could not keep his eyes open. She called EMS and the patient was taken to the ER.  The time he arrived at the ER the symptoms had resolved but during the ER visit a MRI and CT of the head was completed but both were done without contrast.  Patient was diagnosed with aspiration pneumonia and was discharged on Friday from the hospital. Wife had a couple questions for Dr Felecia Shelling. 1) Is the CTangio head and neck still needed since had CT/MRI of head in ER? .2) he has not given him either medication that was prescribed by Dr. Felecia Shelling due to being nervous that this would happen again.  Should she restart either one of those medications. She mentioned during the event on Wednesday morning his leg was shaking intermittently and she wanted to know if either medication would have caused that as well.  Informed the patient I would discuss all of this with Dr. Felecia Shelling and give a call back with his thoughts and recommendations.  She verbalized understanding and was appreciative for the call.

## 2021-06-05 ENCOUNTER — Other Ambulatory Visit: Payer: Self-pay | Admitting: Hematology and Oncology

## 2021-06-07 DIAGNOSIS — I951 Orthostatic hypotension: Secondary | ICD-10-CM | POA: Diagnosis not present

## 2021-06-07 DIAGNOSIS — M479 Spondylosis, unspecified: Secondary | ICD-10-CM | POA: Diagnosis not present

## 2021-06-07 DIAGNOSIS — F0390 Unspecified dementia without behavioral disturbance: Secondary | ICD-10-CM | POA: Diagnosis not present

## 2021-06-07 DIAGNOSIS — C140 Malignant neoplasm of pharynx, unspecified: Secondary | ICD-10-CM | POA: Diagnosis not present

## 2021-06-07 DIAGNOSIS — I4510 Unspecified right bundle-branch block: Secondary | ICD-10-CM | POA: Diagnosis not present

## 2021-06-07 DIAGNOSIS — C109 Malignant neoplasm of oropharynx, unspecified: Secondary | ICD-10-CM | POA: Diagnosis not present

## 2021-06-07 DIAGNOSIS — N1831 Chronic kidney disease, stage 3a: Secondary | ICD-10-CM | POA: Diagnosis not present

## 2021-06-07 DIAGNOSIS — M1990 Unspecified osteoarthritis, unspecified site: Secondary | ICD-10-CM | POA: Diagnosis not present

## 2021-06-07 DIAGNOSIS — D696 Thrombocytopenia, unspecified: Secondary | ICD-10-CM | POA: Diagnosis not present

## 2021-06-07 DIAGNOSIS — I1310 Hypertensive heart and chronic kidney disease without heart failure, with stage 1 through stage 4 chronic kidney disease, or unspecified chronic kidney disease: Secondary | ICD-10-CM | POA: Diagnosis not present

## 2021-06-07 DIAGNOSIS — I440 Atrioventricular block, first degree: Secondary | ICD-10-CM | POA: Diagnosis not present

## 2021-06-07 DIAGNOSIS — E876 Hypokalemia: Secondary | ICD-10-CM | POA: Diagnosis not present

## 2021-06-07 DIAGNOSIS — D472 Monoclonal gammopathy: Secondary | ICD-10-CM | POA: Diagnosis not present

## 2021-06-07 DIAGNOSIS — R29898 Other symptoms and signs involving the musculoskeletal system: Secondary | ICD-10-CM | POA: Diagnosis not present

## 2021-06-07 DIAGNOSIS — I701 Atherosclerosis of renal artery: Secondary | ICD-10-CM | POA: Diagnosis not present

## 2021-06-07 DIAGNOSIS — J690 Pneumonitis due to inhalation of food and vomit: Secondary | ICD-10-CM | POA: Diagnosis not present

## 2021-06-07 DIAGNOSIS — I69398 Other sequelae of cerebral infarction: Secondary | ICD-10-CM | POA: Diagnosis not present

## 2021-06-07 DIAGNOSIS — I712 Thoracic aortic aneurysm, without rupture: Secondary | ICD-10-CM | POA: Diagnosis not present

## 2021-06-07 DIAGNOSIS — C01 Malignant neoplasm of base of tongue: Secondary | ICD-10-CM | POA: Diagnosis not present

## 2021-06-07 DIAGNOSIS — I69328 Other speech and language deficits following cerebral infarction: Secondary | ICD-10-CM | POA: Diagnosis not present

## 2021-06-07 DIAGNOSIS — I700 Atherosclerosis of aorta: Secondary | ICD-10-CM | POA: Diagnosis not present

## 2021-06-07 DIAGNOSIS — G629 Polyneuropathy, unspecified: Secondary | ICD-10-CM | POA: Diagnosis not present

## 2021-06-07 DIAGNOSIS — N179 Acute kidney failure, unspecified: Secondary | ICD-10-CM | POA: Diagnosis not present

## 2021-06-07 DIAGNOSIS — I350 Nonrheumatic aortic (valve) stenosis: Secondary | ICD-10-CM | POA: Diagnosis not present

## 2021-06-07 DIAGNOSIS — I2510 Atherosclerotic heart disease of native coronary artery without angina pectoris: Secondary | ICD-10-CM | POA: Diagnosis not present

## 2021-06-08 ENCOUNTER — Encounter (HOSPITAL_COMMUNITY): Payer: Self-pay | Admitting: Emergency Medicine

## 2021-06-08 ENCOUNTER — Emergency Department (HOSPITAL_COMMUNITY): Payer: Medicare Other

## 2021-06-08 ENCOUNTER — Emergency Department (HOSPITAL_COMMUNITY)
Admission: EM | Admit: 2021-06-08 | Discharge: 2021-06-08 | Disposition: A | Discharge status: Home / Self Care | Payer: Medicare Other | Attending: Emergency Medicine | Admitting: Emergency Medicine

## 2021-06-08 ENCOUNTER — Other Ambulatory Visit: Payer: Self-pay

## 2021-06-08 DIAGNOSIS — R4182 Altered mental status, unspecified: Secondary | ICD-10-CM | POA: Diagnosis not present

## 2021-06-08 DIAGNOSIS — Z951 Presence of aortocoronary bypass graft: Secondary | ICD-10-CM | POA: Insufficient documentation

## 2021-06-08 DIAGNOSIS — F0390 Unspecified dementia without behavioral disturbance: Secondary | ICD-10-CM | POA: Insufficient documentation

## 2021-06-08 DIAGNOSIS — I2510 Atherosclerotic heart disease of native coronary artery without angina pectoris: Secondary | ICD-10-CM | POA: Diagnosis not present

## 2021-06-08 DIAGNOSIS — N189 Chronic kidney disease, unspecified: Secondary | ICD-10-CM | POA: Insufficient documentation

## 2021-06-08 DIAGNOSIS — G939 Disorder of brain, unspecified: Secondary | ICD-10-CM | POA: Diagnosis not present

## 2021-06-08 DIAGNOSIS — R531 Weakness: Secondary | ICD-10-CM | POA: Insufficient documentation

## 2021-06-08 DIAGNOSIS — I639 Cerebral infarction, unspecified: Secondary | ICD-10-CM | POA: Diagnosis not present

## 2021-06-08 DIAGNOSIS — I6521 Occlusion and stenosis of right carotid artery: Secondary | ICD-10-CM

## 2021-06-08 DIAGNOSIS — G458 Other transient cerebral ischemic attacks and related syndromes: Secondary | ICD-10-CM | POA: Diagnosis not present

## 2021-06-08 DIAGNOSIS — R410 Disorientation, unspecified: Secondary | ICD-10-CM | POA: Diagnosis not present

## 2021-06-08 DIAGNOSIS — R29898 Other symptoms and signs involving the musculoskeletal system: Secondary | ICD-10-CM | POA: Diagnosis not present

## 2021-06-08 DIAGNOSIS — E039 Hypothyroidism, unspecified: Secondary | ICD-10-CM | POA: Insufficient documentation

## 2021-06-08 DIAGNOSIS — I517 Cardiomegaly: Secondary | ICD-10-CM | POA: Diagnosis not present

## 2021-06-08 DIAGNOSIS — Z87891 Personal history of nicotine dependence: Secondary | ICD-10-CM | POA: Insufficient documentation

## 2021-06-08 DIAGNOSIS — I679 Cerebrovascular disease, unspecified: Secondary | ICD-10-CM

## 2021-06-08 DIAGNOSIS — Z7982 Long term (current) use of aspirin: Secondary | ICD-10-CM | POA: Insufficient documentation

## 2021-06-08 DIAGNOSIS — R29818 Other symptoms and signs involving the nervous system: Secondary | ICD-10-CM | POA: Diagnosis not present

## 2021-06-08 DIAGNOSIS — Z79899 Other long term (current) drug therapy: Secondary | ICD-10-CM | POA: Insufficient documentation

## 2021-06-08 DIAGNOSIS — I6781 Acute cerebrovascular insufficiency: Secondary | ICD-10-CM

## 2021-06-08 DIAGNOSIS — R4781 Slurred speech: Secondary | ICD-10-CM | POA: Diagnosis not present

## 2021-06-08 DIAGNOSIS — R2990 Unspecified symptoms and signs involving the nervous system: Secondary | ICD-10-CM

## 2021-06-08 LAB — COMPREHENSIVE METABOLIC PANEL
ALT: 17 U/L (ref 0–44)
AST: 25 U/L (ref 15–41)
Albumin: 3.3 g/dL — ABNORMAL LOW (ref 3.5–5.0)
Alkaline Phosphatase: 28 U/L — ABNORMAL LOW (ref 38–126)
Anion gap: 8 (ref 5–15)
BUN: 19 mg/dL (ref 8–23)
CO2: 29 mmol/L (ref 22–32)
Calcium: 9.1 mg/dL (ref 8.9–10.3)
Chloride: 100 mmol/L (ref 98–111)
Creatinine, Ser: 1.61 mg/dL — ABNORMAL HIGH (ref 0.61–1.24)
GFR, Estimated: 43 mL/min — ABNORMAL LOW (ref >60)
Glucose, Bld: 112 mg/dL — ABNORMAL HIGH (ref 70–99)
Potassium: 4.3 mmol/L (ref 3.5–5.1)
Sodium: 137 mmol/L (ref 135–145)
Total Bilirubin: 0.5 mg/dL (ref 0.3–1.2)
Total Protein: 6.8 g/dL (ref 6.5–8.1)

## 2021-06-08 LAB — CBC
HCT: 35.5 % — ABNORMAL LOW (ref 39.0–52.0)
Hemoglobin: 11.7 g/dL — ABNORMAL LOW (ref 13.0–17.0)
MCH: 30.8 pg (ref 26.0–34.0)
MCHC: 33 g/dL (ref 30.0–36.0)
MCV: 93.4 fL (ref 80.0–100.0)
Platelets: 183 10*3/uL (ref 150–400)
RBC: 3.8 MIL/uL — ABNORMAL LOW (ref 4.22–5.81)
RDW: 14.7 % (ref 11.5–15.5)
WBC: 10.2 10*3/uL (ref 4.0–10.5)
nRBC: 0 % (ref 0.0–0.2)

## 2021-06-08 LAB — I-STAT CHEM 8, ED
BUN: 20 mg/dL (ref 8–23)
Calcium, Ion: 1.07 mmol/L — ABNORMAL LOW (ref 1.15–1.40)
Chloride: 100 mmol/L (ref 98–111)
Creatinine, Ser: 1.6 mg/dL — ABNORMAL HIGH (ref 0.61–1.24)
Glucose, Bld: 108 mg/dL — ABNORMAL HIGH (ref 70–99)
HCT: 35 % — ABNORMAL LOW (ref 39.0–52.0)
Hemoglobin: 11.9 g/dL — ABNORMAL LOW (ref 13.0–17.0)
Potassium: 4.1 mmol/L (ref 3.5–5.1)
Sodium: 137 mmol/L (ref 135–145)
TCO2: 27 mmol/L (ref 22–32)

## 2021-06-08 LAB — DIFFERENTIAL
Abs Immature Granulocytes: 0.05 10*3/uL (ref 0.00–0.07)
Basophils Absolute: 0 10*3/uL (ref 0.0–0.1)
Basophils Relative: 0 %
Eosinophils Absolute: 0.2 10*3/uL (ref 0.0–0.5)
Eosinophils Relative: 2 %
Immature Granulocytes: 1 %
Lymphocytes Relative: 16 %
Lymphs Abs: 1.7 10*3/uL (ref 0.7–4.0)
Monocytes Absolute: 0.7 10*3/uL (ref 0.1–1.0)
Monocytes Relative: 7 %
Neutro Abs: 7.5 10*3/uL (ref 1.7–7.7)
Neutrophils Relative %: 74 %

## 2021-06-08 LAB — URINALYSIS, ROUTINE W REFLEX MICROSCOPIC
Bilirubin Urine: NEGATIVE
Glucose, UA: NEGATIVE mg/dL
Hgb urine dipstick: NEGATIVE
Ketones, ur: NEGATIVE mg/dL
Leukocytes,Ua: NEGATIVE
Nitrite: NEGATIVE
Protein, ur: NEGATIVE mg/dL
Specific Gravity, Urine: 1.02 (ref 1.005–1.030)
pH: 6 (ref 5.0–8.0)

## 2021-06-08 LAB — PROTIME-INR
INR: 1.1 (ref 0.8–1.2)
Prothrombin Time: 14.4 seconds (ref 11.4–15.2)

## 2021-06-08 LAB — APTT: aPTT: 31 seconds (ref 24–36)

## 2021-06-08 MED ORDER — SODIUM CHLORIDE 0.9% FLUSH: 3.0000 mL | Freq: Once | INTRAVENOUS | Status: DC
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
## 2021-06-08 NOTE — ED Notes (Signed)
Patient transported to CT 

## 2021-06-08 NOTE — ED Triage Notes (Signed)
LKW 9pm last night.  Wife found pt standing in doorway to bathroom at 7:30am with his pajama pants and underwear off and he was incontinent of urine in the floor.  She had difficulty ambulating him with walker to bathroom.  Reports slurred speech, disoriented, and L sided weakness.  No arm drift at this time. Speech clear.  Pt alert and oriented to person and place.  Disoriented to time states it is August 2020.  States pt had difficulty ambulating with L leg while coming in hospital and did better job when she told him to concentrate on moving L leg.  VAN negative.

## 2021-06-08 NOTE — ED Notes (Signed)
Patient given discharge instructions, all questions answered. Patient in possession of all belongings, directed to the discharge area  

## 2021-06-08 NOTE — ED Notes (Signed)
Patient returns from MRI

## 2021-06-08 NOTE — Discharge Instructions (Addendum)
Please keep your scheduled appointment with your neurologist to discuss hypoperfusion episodes due to carotid artery stenosis and consider discussing starting midodrine to help elevate blood pressure during these episodes.   Stand slowly and assisted, Gatorade during these episodes.

## 2021-06-08 NOTE — ED Notes (Signed)
Patient transported to MRI 

## 2021-06-08 NOTE — ED Notes (Signed)
Patient with 100 ml urine from in/out catheter

## 2021-06-10 DIAGNOSIS — J690 Pneumonitis due to inhalation of food and vomit: Secondary | ICD-10-CM | POA: Diagnosis not present

## 2021-06-10 DIAGNOSIS — N179 Acute kidney failure, unspecified: Secondary | ICD-10-CM | POA: Diagnosis not present

## 2021-06-10 DIAGNOSIS — I1310 Hypertensive heart and chronic kidney disease without heart failure, with stage 1 through stage 4 chronic kidney disease, or unspecified chronic kidney disease: Secondary | ICD-10-CM | POA: Diagnosis not present

## 2021-06-10 DIAGNOSIS — F0390 Unspecified dementia without behavioral disturbance: Secondary | ICD-10-CM | POA: Diagnosis not present

## 2021-06-10 DIAGNOSIS — N1831 Chronic kidney disease, stage 3a: Secondary | ICD-10-CM | POA: Diagnosis not present

## 2021-06-10 DIAGNOSIS — I951 Orthostatic hypotension: Secondary | ICD-10-CM | POA: Diagnosis not present

## 2021-06-11 DIAGNOSIS — N1831 Chronic kidney disease, stage 3a: Secondary | ICD-10-CM | POA: Diagnosis not present

## 2021-06-11 DIAGNOSIS — J690 Pneumonitis due to inhalation of food and vomit: Secondary | ICD-10-CM | POA: Diagnosis not present

## 2021-06-11 DIAGNOSIS — I951 Orthostatic hypotension: Secondary | ICD-10-CM | POA: Diagnosis not present

## 2021-06-11 DIAGNOSIS — F0390 Unspecified dementia without behavioral disturbance: Secondary | ICD-10-CM | POA: Diagnosis not present

## 2021-06-11 DIAGNOSIS — I1310 Hypertensive heart and chronic kidney disease without heart failure, with stage 1 through stage 4 chronic kidney disease, or unspecified chronic kidney disease: Secondary | ICD-10-CM | POA: Diagnosis not present

## 2021-06-11 DIAGNOSIS — N179 Acute kidney failure, unspecified: Secondary | ICD-10-CM | POA: Diagnosis not present

## 2021-06-12 ENCOUNTER — Encounter (HOSPITAL_COMMUNITY): Payer: Self-pay | Admitting: Emergency Medicine

## 2021-06-12 ENCOUNTER — Other Ambulatory Visit: Payer: Self-pay

## 2021-06-12 ENCOUNTER — Emergency Department (HOSPITAL_COMMUNITY)
Admission: EM | Admit: 2021-06-12 | Discharge: 2021-06-12 | Disposition: A | Discharge status: Home / Self Care | Payer: Medicare Other | Attending: Emergency Medicine | Admitting: Emergency Medicine

## 2021-06-12 ENCOUNTER — Emergency Department (HOSPITAL_COMMUNITY): Payer: Medicare Other

## 2021-06-12 DIAGNOSIS — I2510 Atherosclerotic heart disease of native coronary artery without angina pectoris: Secondary | ICD-10-CM | POA: Diagnosis not present

## 2021-06-12 DIAGNOSIS — Z85810 Personal history of malignant neoplasm of tongue: Secondary | ICD-10-CM | POA: Insufficient documentation

## 2021-06-12 DIAGNOSIS — I672 Cerebral atherosclerosis: Secondary | ICD-10-CM | POA: Diagnosis not present

## 2021-06-12 DIAGNOSIS — E785 Hyperlipidemia, unspecified: Secondary | ICD-10-CM | POA: Diagnosis not present

## 2021-06-12 DIAGNOSIS — S0990XA Unspecified injury of head, initial encounter: Secondary | ICD-10-CM | POA: Diagnosis not present

## 2021-06-12 DIAGNOSIS — R531 Weakness: Secondary | ICD-10-CM | POA: Diagnosis not present

## 2021-06-12 DIAGNOSIS — G319 Degenerative disease of nervous system, unspecified: Secondary | ICD-10-CM | POA: Diagnosis not present

## 2021-06-12 DIAGNOSIS — X58XXXA Exposure to other specified factors, initial encounter: Secondary | ICD-10-CM | POA: Insufficient documentation

## 2021-06-12 DIAGNOSIS — I959 Hypotension, unspecified: Secondary | ICD-10-CM | POA: Insufficient documentation

## 2021-06-12 DIAGNOSIS — Z85818 Personal history of malignant neoplasm of other sites of lip, oral cavity, and pharynx: Secondary | ICD-10-CM | POA: Diagnosis not present

## 2021-06-12 DIAGNOSIS — E039 Hypothyroidism, unspecified: Secondary | ICD-10-CM | POA: Diagnosis not present

## 2021-06-12 DIAGNOSIS — R9431 Abnormal electrocardiogram [ECG] [EKG]: Secondary | ICD-10-CM | POA: Diagnosis not present

## 2021-06-12 DIAGNOSIS — I63231 Cerebral infarction due to unspecified occlusion or stenosis of right carotid arteries: Secondary | ICD-10-CM | POA: Diagnosis not present

## 2021-06-12 DIAGNOSIS — S8992XA Unspecified injury of left lower leg, initial encounter: Secondary | ICD-10-CM | POA: Diagnosis present

## 2021-06-12 DIAGNOSIS — Z5321 Procedure and treatment not carried out due to patient leaving prior to being seen by health care provider: Secondary | ICD-10-CM | POA: Insufficient documentation

## 2021-06-12 DIAGNOSIS — W19XXXA Unspecified fall, initial encounter: Secondary | ICD-10-CM | POA: Diagnosis not present

## 2021-06-12 DIAGNOSIS — I6521 Occlusion and stenosis of right carotid artery: Secondary | ICD-10-CM | POA: Diagnosis not present

## 2021-06-12 DIAGNOSIS — Z7982 Long term (current) use of aspirin: Secondary | ICD-10-CM | POA: Diagnosis not present

## 2021-06-12 DIAGNOSIS — N189 Chronic kidney disease, unspecified: Secondary | ICD-10-CM | POA: Diagnosis not present

## 2021-06-12 DIAGNOSIS — R011 Cardiac murmur, unspecified: Secondary | ICD-10-CM | POA: Insufficient documentation

## 2021-06-12 DIAGNOSIS — Z87891 Personal history of nicotine dependence: Secondary | ICD-10-CM | POA: Insufficient documentation

## 2021-06-12 DIAGNOSIS — S8002XA Contusion of left knee, initial encounter: Secondary | ICD-10-CM | POA: Diagnosis not present

## 2021-06-12 DIAGNOSIS — Z79899 Other long term (current) drug therapy: Secondary | ICD-10-CM | POA: Insufficient documentation

## 2021-06-12 DIAGNOSIS — F0390 Unspecified dementia without behavioral disturbance: Secondary | ICD-10-CM | POA: Insufficient documentation

## 2021-06-12 DIAGNOSIS — Y900 Blood alcohol level of less than 20 mg/100 ml: Secondary | ICD-10-CM | POA: Insufficient documentation

## 2021-06-12 DIAGNOSIS — R030 Elevated blood-pressure reading, without diagnosis of hypertension: Secondary | ICD-10-CM | POA: Diagnosis not present

## 2021-06-12 DIAGNOSIS — Z951 Presence of aortocoronary bypass graft: Secondary | ICD-10-CM | POA: Diagnosis not present

## 2021-06-12 DIAGNOSIS — R404 Transient alteration of awareness: Secondary | ICD-10-CM | POA: Diagnosis not present

## 2021-06-12 DIAGNOSIS — R413 Other amnesia: Secondary | ICD-10-CM | POA: Diagnosis not present

## 2021-06-12 DIAGNOSIS — R414 Neurologic neglect syndrome: Secondary | ICD-10-CM | POA: Diagnosis not present

## 2021-06-12 DIAGNOSIS — R051 Acute cough: Secondary | ICD-10-CM | POA: Diagnosis not present

## 2021-06-12 DIAGNOSIS — G629 Polyneuropathy, unspecified: Secondary | ICD-10-CM | POA: Diagnosis not present

## 2021-06-12 DIAGNOSIS — D72829 Elevated white blood cell count, unspecified: Secondary | ICD-10-CM | POA: Diagnosis not present

## 2021-06-12 LAB — COMPREHENSIVE METABOLIC PANEL
ALT: 21 U/L (ref 0–44)
AST: 24 U/L (ref 15–41)
Albumin: 3.4 g/dL — ABNORMAL LOW (ref 3.5–5.0)
Alkaline Phosphatase: 31 U/L — ABNORMAL LOW (ref 38–126)
Anion gap: 9 (ref 5–15)
BUN: 30 mg/dL — ABNORMAL HIGH (ref 8–23)
CO2: 31 mmol/L (ref 22–32)
Calcium: 9.5 mg/dL (ref 8.9–10.3)
Chloride: 100 mmol/L (ref 98–111)
Creatinine, Ser: 1.54 mg/dL — ABNORMAL HIGH (ref 0.61–1.24)
GFR, Estimated: 45 mL/min — ABNORMAL LOW (ref >60)
Glucose, Bld: 96 mg/dL (ref 70–99)
Potassium: 4 mmol/L (ref 3.5–5.1)
Sodium: 140 mmol/L (ref 135–145)
Total Bilirubin: 0.6 mg/dL (ref 0.3–1.2)
Total Protein: 7.7 g/dL (ref 6.5–8.1)

## 2021-06-12 LAB — CBC
HCT: 38.6 % — ABNORMAL LOW (ref 39.0–52.0)
Hemoglobin: 12.3 g/dL — ABNORMAL LOW (ref 13.0–17.0)
MCH: 30.7 pg (ref 26.0–34.0)
MCHC: 31.9 g/dL (ref 30.0–36.0)
MCV: 96.3 fL (ref 80.0–100.0)
Platelets: 210 10*3/uL (ref 150–400)
RBC: 4.01 MIL/uL — ABNORMAL LOW (ref 4.22–5.81)
RDW: 15 % (ref 11.5–15.5)
WBC: 12.7 10*3/uL — ABNORMAL HIGH (ref 4.0–10.5)
nRBC: 0 % (ref 0.0–0.2)

## 2021-06-12 LAB — DIFFERENTIAL
Abs Immature Granulocytes: 0.04 10*3/uL (ref 0.00–0.07)
Basophils Absolute: 0 10*3/uL (ref 0.0–0.1)
Basophils Relative: 0 %
Eosinophils Absolute: 0.3 10*3/uL (ref 0.0–0.5)
Eosinophils Relative: 3 %
Immature Granulocytes: 0 %
Lymphocytes Relative: 14 %
Lymphs Abs: 1.8 10*3/uL (ref 0.7–4.0)
Monocytes Absolute: 1 10*3/uL (ref 0.1–1.0)
Monocytes Relative: 8 %
Neutro Abs: 9.5 10*3/uL — ABNORMAL HIGH (ref 1.7–7.7)
Neutrophils Relative %: 75 %

## 2021-06-12 LAB — URINALYSIS, ROUTINE W REFLEX MICROSCOPIC
Bilirubin Urine: NEGATIVE
Glucose, UA: NEGATIVE mg/dL
Hgb urine dipstick: NEGATIVE
Ketones, ur: NEGATIVE mg/dL
Leukocytes,Ua: NEGATIVE
Nitrite: NEGATIVE
Protein, ur: NEGATIVE mg/dL
Specific Gravity, Urine: 1.015 (ref 1.005–1.030)
pH: 6 (ref 5.0–8.0)

## 2021-06-12 LAB — PROTIME-INR
INR: 1.1 (ref 0.8–1.2)
Prothrombin Time: 14.1 seconds (ref 11.4–15.2)

## 2021-06-12 LAB — RAPID URINE DRUG SCREEN, HOSP PERFORMED
Amphetamines: NOT DETECTED
Barbiturates: NOT DETECTED
Benzodiazepines: POSITIVE — AB
Cocaine: NOT DETECTED
Opiates: NOT DETECTED
Tetrahydrocannabinol: NOT DETECTED

## 2021-06-12 LAB — APTT: aPTT: 30 seconds (ref 24–36)

## 2021-06-12 LAB — ETHANOL: Alcohol, Ethyl (B): <10 mg/dL (ref <10)

## 2021-06-12 NOTE — ED Notes (Signed)
Pt ambulated to restroom with wife.

## 2021-06-12 NOTE — ED Provider Notes (Signed)
Emergency Medicine Provider Triage Evaluation Note  George Bradley , a 80 y.o. male  with h/o multiple strokes and dementia was evaluated in triage. The patient's wife complains of fall and left sided neglect/weakness. Weakness and left sided neglect have been episodic since March of 2022, last episode was seen here on 9/17. The patient had an unwitnessed fall around 0200 this AM. He is on Brilinta and ASA 81mg . He presented to his PC office and was sent over for hypotension 70/40, worsening left sided neglect, confusion, fall this AM, L knee swelling of unknown duration, AKI Cr 1.5, and WBC 15.48. The patient has no complaints.    Review of Systems  Positive:  Negative:   Unable to obtain d/t patient's dementia.   Physical Exam  BP 108/70 (BP Location: Left Arm)   Pulse 72   Temp 98.2 F (36.8 C)   Resp 14   Ht 6\' 1"  (1.854 m)   Wt 79.4 kg   SpO2 91%   BMI 23.09 kg/m  Gen:   Awake, no distress. A&O x2 Resp:  Normal effort  MSK:   Moves extremities without difficulty. Equal strength in bilateral upper and lower extremities. Cranial nerves grossly intact. No facial droop. No slurred speech.  Other:  Medical Decision Making  Medically screening exam initiated at 1:41 PM.  Appropriate orders placed.  LORI LIEW was informed that the remainder of the evaluation will be completed by another provider, this initial triage assessment does not replace that evaluation, and the importance of remaining in the ED until their evaluation is complete.  VSS. Patient is not having any cranial nerve deficit or unilateral weakness.The patient appears stable so that the remainder of the workup may be continued by another provider.     Sherrell Puller, PA-C 06/12/21 1354    Noemi Chapel, MD 06/12/21 [phone]

## 2021-06-12 NOTE — ED Notes (Signed)
Pt has a red warm knot the size of a half dollar below left knee. Unsure as where it came from. Pt does take blood thinners.

## 2021-06-12 NOTE — ED Notes (Signed)
Patient transported to MRI 

## 2021-06-12 NOTE — ED Notes (Signed)
Pt wondered down the hall with wife. Pt kept stating he wants to leave now. I explained to pt that if he leaves and his condition worsens the medical staff or hospital will not be help responsible. He stated he still wanted to leave. Pt wife signed AMA form while pt waited at lobby exit door. Notified EDP

## 2021-06-12 NOTE — ED Triage Notes (Signed)
Pt arrives via POV from Neurologist appt sent over for further eval of left sided weakness, hypotension, leukocytosis. Sent here further eval.
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
## 2021-06-12 NOTE — ED Notes (Signed)
EDP at the bedside.  ?

## 2021-06-13 ENCOUNTER — Telehealth: Payer: Self-pay | Admitting: Neurology

## 2021-06-13 DIAGNOSIS — I951 Orthostatic hypotension: Secondary | ICD-10-CM | POA: Diagnosis not present

## 2021-06-13 DIAGNOSIS — N179 Acute kidney failure, unspecified: Secondary | ICD-10-CM | POA: Diagnosis not present

## 2021-06-13 DIAGNOSIS — I1310 Hypertensive heart and chronic kidney disease without heart failure, with stage 1 through stage 4 chronic kidney disease, or unspecified chronic kidney disease: Secondary | ICD-10-CM | POA: Diagnosis not present

## 2021-06-13 DIAGNOSIS — J690 Pneumonitis due to inhalation of food and vomit: Secondary | ICD-10-CM | POA: Diagnosis not present

## 2021-06-13 DIAGNOSIS — N1831 Chronic kidney disease, stage 3a: Secondary | ICD-10-CM | POA: Diagnosis not present

## 2021-06-13 DIAGNOSIS — F0390 Unspecified dementia without behavioral disturbance: Secondary | ICD-10-CM | POA: Diagnosis not present

## 2021-06-13 NOTE — Telephone Encounter (Signed)
I spoke to Mrs. George Bradley.  [name] has had 2 visits to the ED due to hypotension.  This is worse upon standing.    Of note, he does have the right carotid occlusion and with hypotension there is a high likelihood that he would be hypoperfusing the brain.  He had been placed on fludrocortisone when he was hospitalized in July and I added Mestinon since he was still experiencing orthostatic hypotension.  We got a message from the by [facility] nurse about a drug interaction between these 2 medications.  It is true that fludrocortisone can lessen the effectiveness of Mestinon.  However, the Mestinon was an add-on medication so this is not an interaction that I would be worried about.  When I started the Mestinon he did have some lethargy though this could also have been due to him starting a new sleeping pill.  He has since stopped the sleeping pill.  Therefore, I have asked his wife to increase the dose back to 1 mg 3 times daily (we had temporarily lowered it to half a pill 3 times daily)  She has noted that he has a tremor when he is more active.  It sounds like the tremor is fairly rapid and I do not think it is a parkinsonian tremor (I saw him just a couple weeks ago and did not note a tremor of concern).  However, if this continues to worsen we will bring him in for a sooner appointment

## 2021-06-13 NOTE — Telephone Encounter (Signed)
Called the [facility] nurse back. She states that couple weeks ago she came over to complete a assessment on the patient and he could hardly stand. She wanted to get orthostatics on the patient and his legs were so shaky he wasn't able to stand up to complete the BP. The wife states that some days he can get up and walk fine and other days he can hardly move. She states the other day the patient walked up 5 steps and then legs gave out. PT noted wife states he fell the night of 9/20 and she had to call EMS to get back to bed. She went to doc yesterday and they referred to ER. Since our last call he has had two ER visits one on 9/17 and another on 9/21.   The nurse is calling because these two meds have level 2 interaction side effects. I advised we do not prescribe the florinef but looks like he has been on that for a while. The Mestinon was a new medication started and it correlates to when this increased in weakness also started. They are required to report this. I advised we were appreciative for the call and that I will send this to Dr Felecia Shelling for him to review. Advised I would call back with what his thoughts/recommendations are.

## 2021-06-13 NOTE — Telephone Encounter (Signed)
George Bradley [facility]) called reporting a level 2 drug interaction with pyridostigmine (MESTINON) 60 MG tablet and fludrocortisone (FLORINEF) 0.1 MG tablet Would like a call back.

## 2021-06-15 ENCOUNTER — Encounter (HOSPITAL_COMMUNITY): Payer: Self-pay | Admitting: Emergency Medicine

## 2021-06-15 ENCOUNTER — Emergency Department (HOSPITAL_COMMUNITY): Payer: Medicare Other

## 2021-06-15 ENCOUNTER — Other Ambulatory Visit: Payer: Self-pay

## 2021-06-15 ENCOUNTER — Inpatient Hospital Stay (HOSPITAL_COMMUNITY)
Admission: EM | Admit: 2021-06-15 | Discharge: 2021-06-18 | DRG: 871 | Disposition: A | Discharge status: Home Health | Payer: Medicare Other | Attending: Internal Medicine | Admitting: Internal Medicine

## 2021-06-15 DIAGNOSIS — I701 Atherosclerosis of renal artery: Secondary | ICD-10-CM | POA: Diagnosis present

## 2021-06-15 DIAGNOSIS — Z79899 Other long term (current) drug therapy: Secondary | ICD-10-CM

## 2021-06-15 DIAGNOSIS — E46 Unspecified protein-calorie malnutrition: Secondary | ICD-10-CM

## 2021-06-15 DIAGNOSIS — G9341 Metabolic encephalopathy: Secondary | ICD-10-CM | POA: Diagnosis not present

## 2021-06-15 DIAGNOSIS — N1831 Chronic kidney disease, stage 3a: Secondary | ICD-10-CM | POA: Diagnosis present

## 2021-06-15 DIAGNOSIS — A419 Sepsis, unspecified organism: Secondary | ICD-10-CM | POA: Diagnosis not present

## 2021-06-15 DIAGNOSIS — Y95 Nosocomial condition: Secondary | ICD-10-CM | POA: Diagnosis present

## 2021-06-15 DIAGNOSIS — Z20822 Contact with and (suspected) exposure to covid-19: Secondary | ICD-10-CM | POA: Diagnosis not present

## 2021-06-15 DIAGNOSIS — I440 Atrioventricular block, first degree: Secondary | ICD-10-CM | POA: Diagnosis present

## 2021-06-15 DIAGNOSIS — E8809 Other disorders of plasma-protein metabolism, not elsewhere classified: Secondary | ICD-10-CM | POA: Diagnosis not present

## 2021-06-15 DIAGNOSIS — Z8673 Personal history of transient ischemic attack (TIA), and cerebral infarction without residual deficits: Secondary | ICD-10-CM

## 2021-06-15 DIAGNOSIS — Z6823 Body mass index (BMI) 23.0-23.9, adult: Secondary | ICD-10-CM

## 2021-06-15 DIAGNOSIS — I517 Cardiomegaly: Secondary | ICD-10-CM | POA: Diagnosis not present

## 2021-06-15 DIAGNOSIS — E876 Hypokalemia: Secondary | ICD-10-CM | POA: Diagnosis present

## 2021-06-15 DIAGNOSIS — Z7952 Long term (current) use of systemic steroids: Secondary | ICD-10-CM

## 2021-06-15 DIAGNOSIS — K117 Disturbances of salivary secretion: Secondary | ICD-10-CM | POA: Diagnosis present

## 2021-06-15 DIAGNOSIS — R531 Weakness: Secondary | ICD-10-CM | POA: Diagnosis not present

## 2021-06-15 DIAGNOSIS — I4510 Unspecified right bundle-branch block: Secondary | ICD-10-CM | POA: Diagnosis present

## 2021-06-15 DIAGNOSIS — N400 Enlarged prostate without lower urinary tract symptoms: Secondary | ICD-10-CM | POA: Diagnosis present

## 2021-06-15 DIAGNOSIS — J9601 Acute respiratory failure with hypoxia: Secondary | ICD-10-CM | POA: Diagnosis present

## 2021-06-15 DIAGNOSIS — Z888 Allergy status to other drugs, medicaments and biological substances status: Secondary | ICD-10-CM

## 2021-06-15 DIAGNOSIS — Z8249 Family history of ischemic heart disease and other diseases of the circulatory system: Secondary | ICD-10-CM

## 2021-06-15 DIAGNOSIS — G629 Polyneuropathy, unspecified: Secondary | ICD-10-CM | POA: Diagnosis present

## 2021-06-15 DIAGNOSIS — D892 Hypergammaglobulinemia, unspecified: Secondary | ICD-10-CM | POA: Diagnosis present

## 2021-06-15 DIAGNOSIS — R0902 Hypoxemia: Secondary | ICD-10-CM | POA: Diagnosis not present

## 2021-06-15 DIAGNOSIS — Z66 Do not resuscitate: Secondary | ICD-10-CM | POA: Diagnosis present

## 2021-06-15 DIAGNOSIS — J189 Pneumonia, unspecified organism: Secondary | ICD-10-CM | POA: Diagnosis not present

## 2021-06-15 DIAGNOSIS — Z85818 Personal history of malignant neoplasm of other sites of lip, oral cavity, and pharynx: Secondary | ICD-10-CM

## 2021-06-15 DIAGNOSIS — I2510 Atherosclerotic heart disease of native coronary artery without angina pectoris: Secondary | ICD-10-CM | POA: Diagnosis present

## 2021-06-15 DIAGNOSIS — Z974 Presence of external hearing-aid: Secondary | ICD-10-CM

## 2021-06-15 DIAGNOSIS — J690 Pneumonitis due to inhalation of food and vomit: Secondary | ICD-10-CM | POA: Diagnosis not present

## 2021-06-15 DIAGNOSIS — R000 Tachycardia, unspecified: Secondary | ICD-10-CM | POA: Diagnosis not present

## 2021-06-15 DIAGNOSIS — I10 Essential (primary) hypertension: Secondary | ICD-10-CM | POA: Diagnosis not present

## 2021-06-15 DIAGNOSIS — Z85810 Personal history of malignant neoplasm of tongue: Secondary | ICD-10-CM

## 2021-06-15 DIAGNOSIS — I951 Orthostatic hypotension: Secondary | ICD-10-CM | POA: Diagnosis present

## 2021-06-15 DIAGNOSIS — E441 Mild protein-calorie malnutrition: Secondary | ICD-10-CM | POA: Diagnosis present

## 2021-06-15 DIAGNOSIS — J181 Lobar pneumonia, unspecified organism: Secondary | ICD-10-CM

## 2021-06-15 DIAGNOSIS — R011 Cardiac murmur, unspecified: Secondary | ICD-10-CM | POA: Diagnosis present

## 2021-06-15 DIAGNOSIS — R404 Transient alteration of awareness: Secondary | ICD-10-CM | POA: Diagnosis not present

## 2021-06-15 DIAGNOSIS — D472 Monoclonal gammopathy: Secondary | ICD-10-CM | POA: Diagnosis present

## 2021-06-15 DIAGNOSIS — I452 Bifascicular block: Secondary | ICD-10-CM | POA: Diagnosis present

## 2021-06-15 DIAGNOSIS — E890 Postprocedural hypothyroidism: Secondary | ICD-10-CM | POA: Diagnosis present

## 2021-06-15 DIAGNOSIS — R9431 Abnormal electrocardiogram [ECG] [EKG]: Secondary | ICD-10-CM | POA: Diagnosis not present

## 2021-06-15 DIAGNOSIS — D649 Anemia, unspecified: Secondary | ICD-10-CM | POA: Diagnosis present

## 2021-06-15 DIAGNOSIS — Z87891 Personal history of nicotine dependence: Secondary | ICD-10-CM

## 2021-06-15 DIAGNOSIS — E785 Hyperlipidemia, unspecified: Secondary | ICD-10-CM | POA: Diagnosis present

## 2021-06-15 DIAGNOSIS — F5104 Psychophysiologic insomnia: Secondary | ICD-10-CM | POA: Diagnosis present

## 2021-06-15 DIAGNOSIS — K222 Esophageal obstruction: Secondary | ICD-10-CM | POA: Diagnosis present

## 2021-06-15 DIAGNOSIS — Y842 Radiological procedure and radiotherapy as the cause of abnormal reaction of the patient, or of later complication, without mention of misadventure at the time of the procedure: Secondary | ICD-10-CM | POA: Diagnosis present

## 2021-06-15 DIAGNOSIS — R0689 Other abnormalities of breathing: Secondary | ICD-10-CM | POA: Diagnosis not present

## 2021-06-15 DIAGNOSIS — R0602 Shortness of breath: Secondary | ICD-10-CM | POA: Diagnosis not present

## 2021-06-15 DIAGNOSIS — I129 Hypertensive chronic kidney disease with stage 1 through stage 4 chronic kidney disease, or unspecified chronic kidney disease: Secondary | ICD-10-CM | POA: Diagnosis present

## 2021-06-15 DIAGNOSIS — Z79890 Hormone replacement therapy (postmenopausal): Secondary | ICD-10-CM

## 2021-06-15 DIAGNOSIS — G4700 Insomnia, unspecified: Secondary | ICD-10-CM | POA: Diagnosis present

## 2021-06-15 DIAGNOSIS — K219 Gastro-esophageal reflux disease without esophagitis: Secondary | ICD-10-CM | POA: Diagnosis present

## 2021-06-15 DIAGNOSIS — F0390 Unspecified dementia without behavioral disturbance: Secondary | ICD-10-CM | POA: Diagnosis present

## 2021-06-15 DIAGNOSIS — Z7982 Long term (current) use of aspirin: Secondary | ICD-10-CM

## 2021-06-15 DIAGNOSIS — Z923 Personal history of irradiation: Secondary | ICD-10-CM

## 2021-06-15 LAB — BLOOD GAS, ARTERIAL
Acid-Base Excess: 2.4 mmol/L — ABNORMAL HIGH (ref 0.0–2.0)
Bicarbonate: 26.4 mmol/L (ref 20.0–28.0)
FIO2: 100
O2 Saturation: 95.1 %
Patient temperature: 38.9
pCO2 arterial: 47 mmHg (ref 32.0–48.0)
pH, Arterial: 7.383 (ref 7.350–7.450)
pO2, Arterial: 93.3 mmHg (ref 83.0–108.0)

## 2021-06-15 LAB — CBC WITH DIFFERENTIAL/PLATELET
Abs Immature Granulocytes: 0.02 10*3/uL (ref 0.00–0.07)
Basophils Absolute: 0 10*3/uL (ref 0.0–0.1)
Basophils Relative: 0 %
Eosinophils Absolute: 0.1 10*3/uL (ref 0.0–0.5)
Eosinophils Relative: 1 %
HCT: 38.3 % — ABNORMAL LOW (ref 39.0–52.0)
Hemoglobin: 12.8 g/dL — ABNORMAL LOW (ref 13.0–17.0)
Immature Granulocytes: 0 %
Lymphocytes Relative: 8 %
Lymphs Abs: 0.8 10*3/uL (ref 0.7–4.0)
MCH: 31.8 pg (ref 26.0–34.0)
MCHC: 33.4 g/dL (ref 30.0–36.0)
MCV: 95.3 fL (ref 80.0–100.0)
Monocytes Absolute: 0.6 10*3/uL (ref 0.1–1.0)
Monocytes Relative: 6 %
Neutro Abs: 7.7 10*3/uL (ref 1.7–7.7)
Neutrophils Relative %: 85 %
Platelets: 198 10*3/uL (ref 150–400)
RBC: 4.02 MIL/uL — ABNORMAL LOW (ref 4.22–5.81)
RDW: 14.6 % (ref 11.5–15.5)
WBC: 9.1 10*3/uL (ref 4.0–10.5)
nRBC: 0 % (ref 0.0–0.2)

## 2021-06-15 LAB — COMPREHENSIVE METABOLIC PANEL
ALT: 19 U/L (ref 0–44)
AST: 25 U/L (ref 15–41)
Albumin: 3.4 g/dL — ABNORMAL LOW (ref 3.5–5.0)
Alkaline Phosphatase: 35 U/L — ABNORMAL LOW (ref 38–126)
Anion gap: 8 (ref 5–15)
BUN: 17 mg/dL (ref 8–23)
CO2: 29 mmol/L (ref 22–32)
Calcium: 8.8 mg/dL — ABNORMAL LOW (ref 8.9–10.3)
Chloride: 103 mmol/L (ref 98–111)
Creatinine, Ser: 1.17 mg/dL (ref 0.61–1.24)
GFR, Estimated: >60 mL/min (ref >60)
Glucose, Bld: 121 mg/dL — ABNORMAL HIGH (ref 70–99)
Potassium: 4 mmol/L (ref 3.5–5.1)
Sodium: 140 mmol/L (ref 135–145)
Total Bilirubin: 0.5 mg/dL (ref 0.3–1.2)
Total Protein: 7.5 g/dL (ref 6.5–8.1)

## 2021-06-15 LAB — URINALYSIS, ROUTINE W REFLEX MICROSCOPIC
Bilirubin Urine: NEGATIVE
Glucose, UA: NEGATIVE mg/dL
Hgb urine dipstick: NEGATIVE
Ketones, ur: NEGATIVE mg/dL
Leukocytes,Ua: NEGATIVE
Nitrite: NEGATIVE
Protein, ur: NEGATIVE mg/dL
Specific Gravity, Urine: 1.011 (ref 1.005–1.030)
pH: 7 (ref 5.0–8.0)

## 2021-06-15 LAB — LACTIC ACID, PLASMA
Lactic Acid, Venous: 1.9 mmol/L (ref 0.5–1.9)
Lactic Acid, Venous: 1.9 mmol/L (ref 0.5–1.9)

## 2021-06-15 LAB — PROTIME-INR
INR: 1 (ref 0.8–1.2)
Prothrombin Time: 13.1 seconds (ref 11.4–15.2)

## 2021-06-15 LAB — RESP PANEL BY RT-PCR (FLU A&B, COVID) ARPGX2
Influenza A by PCR: NEGATIVE
Influenza B by PCR: NEGATIVE
SARS Coronavirus 2 by RT PCR: NEGATIVE

## 2021-06-15 LAB — APTT: aPTT: 26 seconds (ref 24–36)

## 2021-06-15 MED ORDER — ROSUVASTATIN CALCIUM 20 MG PO TABS
20.0000 mg | ORAL_TABLET | Freq: Every day | ORAL | Status: DC
  Administered 2021-06-16 – 2021-06-18 (×3): 20 mg via ORAL
  Filled 2021-06-15 (×3): qty 1

## 2021-06-15 MED ORDER — LACTATED RINGERS IV BOLUS (SEPSIS)
500.0000 mL | Freq: Once | INTRAVENOUS | Status: AC
  Administered 2021-06-15: 500 mL via INTRAVENOUS

## 2021-06-15 MED ORDER — AMLODIPINE BESYLATE 5 MG PO TABS
2.5000 mg | ORAL_TABLET | Freq: Every day | ORAL | Status: DC
  Filled 2021-06-15: qty 1

## 2021-06-15 MED ORDER — DM-GUAIFENESIN ER 30-600 MG PO TB12
1.0000 | ORAL_TABLET | Freq: Two times a day (BID) | ORAL | Status: DC
  Administered 2021-06-15 – 2021-06-18 (×6): 1 via ORAL
  Filled 2021-06-15 (×6): qty 1

## 2021-06-15 MED ORDER — ACETAMINOPHEN 650 MG RE SUPP
650.0000 mg | Freq: Once | RECTAL | Status: AC
  Administered 2021-06-15: 650 mg via RECTAL

## 2021-06-15 MED ORDER — ENOXAPARIN SODIUM 40 MG/0.4ML IJ SOSY
40.0000 mg | PREFILLED_SYRINGE | Freq: Every day | INTRAMUSCULAR | Status: DC
  Administered 2021-06-15 – 2021-06-17 (×3): 40 mg via SUBCUTANEOUS
  Filled 2021-06-15 (×3): qty 0.4

## 2021-06-15 MED ORDER — LACTATED RINGERS IV BOLUS (SEPSIS)
1000.0000 mL | Freq: Once | INTRAVENOUS | Status: AC
  Administered 2021-06-15: 1000 mL via INTRAVENOUS

## 2021-06-15 MED ORDER — SODIUM CHLORIDE 0.9 % IV SOLN
2.0000 g | Freq: Once | INTRAVENOUS | Status: AC
  Administered 2021-06-15: 2 g via INTRAVENOUS
  Filled 2021-06-15: qty 2

## 2021-06-15 MED ORDER — SODIUM CHLORIDE 0.9 % IV SOLN
2.0000 g | Freq: Two times a day (BID) | INTRAVENOUS | Status: DC
  Administered 2021-06-16: 2 g via INTRAVENOUS
  Filled 2021-06-15: qty 2

## 2021-06-15 MED ORDER — ASPIRIN EC 81 MG PO TBEC
81.0000 mg | DELAYED_RELEASE_TABLET | Freq: Every day | ORAL | Status: DC
  Administered 2021-06-16 – 2021-06-18 (×3): 81 mg via ORAL
  Filled 2021-06-15 (×3): qty 1

## 2021-06-15 MED ORDER — VANCOMYCIN HCL IN DEXTROSE 1-5 GM/200ML-% IV SOLN
1000.0000 mg | Freq: Once | INTRAVENOUS | Status: AC
  Administered 2021-06-15: 1000 mg via INTRAVENOUS
  Filled 2021-06-15: qty 200

## 2021-06-15 MED ORDER — ENSURE ENLIVE PO LIQD
237.0000 mL | Freq: Two times a day (BID) | ORAL | Status: DC
  Filled 2021-06-15 (×4): qty 237

## 2021-06-15 MED ORDER — FLUDROCORTISONE ACETATE 0.1 MG PO TABS
0.1000 mg | ORAL_TABLET | Freq: Every day | ORAL | Status: DC
  Administered 2021-06-16 – 2021-06-18 (×3): 0.1 mg via ORAL
  Filled 2021-06-15 (×3): qty 1

## 2021-06-15 MED ORDER — GABAPENTIN 300 MG PO CAPS
900.0000 mg | ORAL_CAPSULE | Freq: Three times a day (TID) | ORAL | Status: DC
  Administered 2021-06-16 – 2021-06-18 (×7): 900 mg via ORAL
  Filled 2021-06-15 (×7): qty 3

## 2021-06-15 MED ORDER — LEVOTHYROXINE SODIUM 100 MCG PO TABS
100.0000 ug | ORAL_TABLET | Freq: Every day | ORAL | Status: DC
  Administered 2021-06-16 – 2021-06-18 (×3): 100 ug via ORAL
  Filled 2021-06-15 (×3): qty 1

## 2021-06-15 MED ORDER — VANCOMYCIN HCL 1250 MG/250ML IV SOLN: 1250.0000 mg | INTRAVENOUS | Status: DC

## 2021-06-15 MED ORDER — TICAGRELOR 90 MG PO TABS
90.0000 mg | ORAL_TABLET | Freq: Two times a day (BID) | ORAL | Status: DC
  Administered 2021-06-16 – 2021-06-18 (×5): 90 mg via ORAL
  Filled 2021-06-15 (×5): qty 1

## 2021-06-15 MED ORDER — LACTATED RINGERS IV SOLN: INTRAVENOUS | Status: DC

## 2021-06-15 MED ORDER — ACETAMINOPHEN 650 MG RE SUPP
650.0000 mg | Freq: Once | RECTAL | Status: DC
Start: 2021-06-15 — End: 2021-06-18

## 2021-06-15 NOTE — Progress Notes (Signed)
Pt being followed by [facility] for Sepsis protocol. 

## 2021-06-15 NOTE — ED Triage Notes (Signed)
  Patient BIB EMS for SOB and weakness.  EMS states they were called out due to difficulty breathing and patients SPO2 was 60% on arrival.  Patient has hx of stroke, CABG, and aspiration pneumonia. Patient A&O x4.  Febrile 103.8 rectally on arrival.
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
## 2021-06-15 NOTE — Progress Notes (Signed)
Pharmacy Antibiotic Note  George Bradley is a 80 y.o. male admitted on 06/15/2021 with pneumonia.  Pharmacy has been consulted for vancomycin and cefepime dosing.  Plan: Vancomycin 2000 mg IV x 1 dose. Vancomycin 1250 mg IV every 24 hours. Cefepime 2000 mg IV every 12 hours. Monitor labs, c/s, and vanco level as indicated.  Height: 6\' 1"  (185.4 cm) Weight: 79.4 kg (175 lb) IBW/kg (Calculated) : 79.9  Temp (24hrs), Avg:103.8 F (39.9 C), Min:103.8 F (39.9 C), Max:103.8 F (39.9 C)  Recent Labs  Lab 06/12/21 1347 06/15/21 1932  WBC 12.7* 9.1  CREATININE 1.54* 1.17  LATICACIDVEN  --  1.9    Estimated Creatinine Clearance: 56.6 mL/min (by C-G formula based on SCr of 1.17 mg/dL).    Allergies  Allergen Reactions   Doxepin Other (See Comments)    Left-sided weakness appeared the morning after this was first taken   Zetia [Ezetimibe] Other (See Comments)    "made me feel bad"    Antimicrobials this admission: Vanco 9/24 >> Cefepime 9/24 >>   Microbiology results: 9/24 BCx: pending 9/24 UCx: pending   9/24 MRSA PCR: pending  Thank you for allowing pharmacy to be a part of this patient's care.  Ramond Craver 06/15/2021 8:45 PM

## 2021-06-16 DIAGNOSIS — Z66 Do not resuscitate: Secondary | ICD-10-CM | POA: Diagnosis present

## 2021-06-16 DIAGNOSIS — I129 Hypertensive chronic kidney disease with stage 1 through stage 4 chronic kidney disease, or unspecified chronic kidney disease: Secondary | ICD-10-CM | POA: Diagnosis present

## 2021-06-16 DIAGNOSIS — A419 Sepsis, unspecified organism: Secondary | ICD-10-CM | POA: Diagnosis present

## 2021-06-16 DIAGNOSIS — Y95 Nosocomial condition: Secondary | ICD-10-CM | POA: Diagnosis present

## 2021-06-16 DIAGNOSIS — E876 Hypokalemia: Secondary | ICD-10-CM | POA: Diagnosis present

## 2021-06-16 DIAGNOSIS — I4510 Unspecified right bundle-branch block: Secondary | ICD-10-CM | POA: Diagnosis present

## 2021-06-16 DIAGNOSIS — J690 Pneumonitis due to inhalation of food and vomit: Secondary | ICD-10-CM | POA: Diagnosis present

## 2021-06-16 DIAGNOSIS — E8809 Other disorders of plasma-protein metabolism, not elsewhere classified: Secondary | ICD-10-CM | POA: Diagnosis present

## 2021-06-16 DIAGNOSIS — G629 Polyneuropathy, unspecified: Secondary | ICD-10-CM | POA: Diagnosis present

## 2021-06-16 DIAGNOSIS — F0390 Unspecified dementia without behavioral disturbance: Secondary | ICD-10-CM | POA: Diagnosis present

## 2021-06-16 DIAGNOSIS — J9601 Acute respiratory failure with hypoxia: Secondary | ICD-10-CM | POA: Diagnosis present

## 2021-06-16 DIAGNOSIS — I951 Orthostatic hypotension: Secondary | ICD-10-CM

## 2021-06-16 DIAGNOSIS — E890 Postprocedural hypothyroidism: Secondary | ICD-10-CM | POA: Diagnosis present

## 2021-06-16 DIAGNOSIS — E441 Mild protein-calorie malnutrition: Secondary | ICD-10-CM | POA: Diagnosis present

## 2021-06-16 DIAGNOSIS — F5104 Psychophysiologic insomnia: Secondary | ICD-10-CM | POA: Diagnosis present

## 2021-06-16 DIAGNOSIS — D472 Monoclonal gammopathy: Secondary | ICD-10-CM | POA: Diagnosis not present

## 2021-06-16 DIAGNOSIS — G9341 Metabolic encephalopathy: Secondary | ICD-10-CM | POA: Diagnosis present

## 2021-06-16 DIAGNOSIS — N1831 Chronic kidney disease, stage 3a: Secondary | ICD-10-CM | POA: Diagnosis present

## 2021-06-16 DIAGNOSIS — I452 Bifascicular block: Secondary | ICD-10-CM | POA: Diagnosis present

## 2021-06-16 DIAGNOSIS — I701 Atherosclerosis of renal artery: Secondary | ICD-10-CM | POA: Diagnosis present

## 2021-06-16 DIAGNOSIS — D649 Anemia, unspecified: Secondary | ICD-10-CM | POA: Diagnosis present

## 2021-06-16 DIAGNOSIS — J189 Pneumonia, unspecified organism: Secondary | ICD-10-CM | POA: Diagnosis present

## 2021-06-16 DIAGNOSIS — Z6823 Body mass index (BMI) 23.0-23.9, adult: Secondary | ICD-10-CM | POA: Diagnosis not present

## 2021-06-16 DIAGNOSIS — Z20822 Contact with and (suspected) exposure to covid-19: Secondary | ICD-10-CM | POA: Diagnosis present

## 2021-06-16 DIAGNOSIS — R4182 Altered mental status, unspecified: Secondary | ICD-10-CM | POA: Diagnosis not present

## 2021-06-16 DIAGNOSIS — J181 Lobar pneumonia, unspecified organism: Secondary | ICD-10-CM | POA: Diagnosis not present

## 2021-06-16 DIAGNOSIS — I2510 Atherosclerotic heart disease of native coronary artery without angina pectoris: Secondary | ICD-10-CM | POA: Diagnosis present

## 2021-06-16 DIAGNOSIS — R0602 Shortness of breath: Secondary | ICD-10-CM | POA: Diagnosis not present

## 2021-06-16 DIAGNOSIS — E785 Hyperlipidemia, unspecified: Secondary | ICD-10-CM | POA: Diagnosis present

## 2021-06-16 DIAGNOSIS — Y842 Radiological procedure and radiotherapy as the cause of abnormal reaction of the patient, or of later complication, without mention of misadventure at the time of the procedure: Secondary | ICD-10-CM | POA: Diagnosis present

## 2021-06-16 LAB — COMPREHENSIVE METABOLIC PANEL
ALT: 16 U/L (ref 0–44)
AST: 30 U/L (ref 15–41)
Albumin: 2.9 g/dL — ABNORMAL LOW (ref 3.5–5.0)
Alkaline Phosphatase: 28 U/L — ABNORMAL LOW (ref 38–126)
Anion gap: 7 (ref 5–15)
BUN: 15 mg/dL (ref 8–23)
CO2: 28 mmol/L (ref 22–32)
Calcium: 8.7 mg/dL — ABNORMAL LOW (ref 8.9–10.3)
Chloride: 105 mmol/L (ref 98–111)
Creatinine, Ser: 0.88 mg/dL (ref 0.61–1.24)
GFR, Estimated: >60 mL/min (ref >60)
Glucose, Bld: 117 mg/dL — ABNORMAL HIGH (ref 70–99)
Potassium: 3.9 mmol/L (ref 3.5–5.1)
Sodium: 140 mmol/L (ref 135–145)
Total Bilirubin: 0.6 mg/dL (ref 0.3–1.2)
Total Protein: 6.2 g/dL — ABNORMAL LOW (ref 6.5–8.1)

## 2021-06-16 LAB — APTT: aPTT: 34 seconds (ref 24–36)

## 2021-06-16 LAB — CBC
HCT: 32.8 % — ABNORMAL LOW (ref 39.0–52.0)
Hemoglobin: 10.7 g/dL — ABNORMAL LOW (ref 13.0–17.0)
MCH: 30.7 pg (ref 26.0–34.0)
MCHC: 32.6 g/dL (ref 30.0–36.0)
MCV: 94 fL (ref 80.0–100.0)
Platelets: 178 10*3/uL (ref 150–400)
RBC: 3.49 MIL/uL — ABNORMAL LOW (ref 4.22–5.81)
RDW: 14.9 % (ref 11.5–15.5)
WBC: 15.2 10*3/uL — ABNORMAL HIGH (ref 4.0–10.5)
nRBC: 0 % (ref 0.0–0.2)

## 2021-06-16 LAB — PHOSPHORUS: Phosphorus: 4.1 mg/dL (ref 2.5–4.6)

## 2021-06-16 LAB — PROTIME-INR
INR: 1.2 (ref 0.8–1.2)
Prothrombin Time: 15.4 seconds — ABNORMAL HIGH (ref 11.4–15.2)

## 2021-06-16 LAB — EXPECTORATED SPUTUM ASSESSMENT W GRAM STAIN, RFLX TO RESP C

## 2021-06-16 LAB — PROCALCITONIN: Procalcitonin: 7.66 ng/mL

## 2021-06-16 LAB — MAGNESIUM: Magnesium: 1.9 mg/dL (ref 1.7–2.4)

## 2021-06-16 LAB — MRSA NEXT GEN BY PCR, NASAL: MRSA by PCR Next Gen: NOT DETECTED

## 2021-06-16 LAB — STREP PNEUMONIAE URINARY ANTIGEN: Strep Pneumo Urinary Antigen: NEGATIVE

## 2021-06-16 MED ORDER — PIPERACILLIN-TAZOBACTAM 3.375 G IVPB
3.3750 g | Freq: Three times a day (TID) | INTRAVENOUS | Status: DC
  Administered 2021-06-16 – 2021-06-18 (×7): 3.375 g via INTRAVENOUS
  Filled 2021-06-16 (×7): qty 50

## 2021-06-16 MED ORDER — AMITRIPTYLINE HCL 25 MG PO TABS
25.0000 mg | ORAL_TABLET | Freq: Every day | ORAL | Status: DC
  Administered 2021-06-16 – 2021-06-17 (×2): 25 mg via ORAL
  Filled 2021-06-16 (×2): qty 1

## 2021-06-16 MED ORDER — LACTATED RINGERS IV SOLN: INTRAVENOUS | Status: DC

## 2021-06-16 MED ORDER — DONEPEZIL HCL 5 MG PO TABS
5.0000 mg | ORAL_TABLET | Freq: Every day | ORAL | Status: DC
  Administered 2021-06-16 – 2021-06-17 (×2): 5 mg via ORAL
  Filled 2021-06-16 (×2): qty 1

## 2021-06-16 MED ORDER — PYRIDOSTIGMINE BROMIDE 60 MG PO TABS
30.0000 mg | ORAL_TABLET | Freq: Three times a day (TID) | ORAL | Status: DC
  Administered 2021-06-16 – 2021-06-18 (×7): 30 mg via ORAL
  Filled 2021-06-16 (×7): qty 1

## 2021-06-16 NOTE — Progress Notes (Signed)
PROGRESS NOTE  George Bradley NFA:[id number] DOB: [birth year] DOA: 06/15/2021 PCP: [name], MD  Brief History:  80 year old male with a history of stroke, right carotid stenosis, coronary disease, CKD stage III, hyperlipidemia, oropharyngeal cancer/tongue cancer, hypothyroidism, cognitive impairment, MGUS, orthostatic hypotension, insomnia presenting with recurrent episodes of "shaking all over" accompanied by generalized weakness and confusion.  The patient is a difficult historian secondary to his cognitive impairment.  History is obtained from speaking with the patient's spouse.  Review of the medical record shows that the patient has had these episodes for a few weeks according to the patient's spouse.  She states that the episodes last about 20 to 30 minutes.  She states that " he is like dead weight" during these episodes; however, the patient is awake and speaks with the patient spouse albeit confused during these episodes.  She states that they checked his vitals during these episodes and he has a "normal" blood pressure.  Normally, the patient has been on a regimen of permissive hypertension secondary to his recurrent orthostatic hypotension.  He has not had any fevers, chills, chest pain, shortness breath, cough, hemoptysis.  However, when EMS was activated they noted the patient to have hypoxia and placed him on supplemental oxygen.  In addition, the spouse states that the patient had an episode of emesis yesterday without any blood.  He has not been having any diarrhea, abdominal pain, dysuria, hematuria.  He has been started back on his pyridostigmine after a period of time discontinuation secondary to concerns for interaction with his Florinef.  He has not been started any other medications.  He continues on amitriptyline for his chronic insomnia.  As noted in the past, the patient has chronic issues with insomnia for which she has taken intermittently Ambien, doxepin, and Seroquel in  the past. The patient has had numerous hospitalizations and ED visits for a variety of issues.  Most recently, the patient had an ED visit on 06/12/2021.  Because of the above complaints and concerns for left-sided neglect.  MRI of the brain on that day was negative for any acute findings.  It was felt that the patient likely had a watershed type effect secondary to his orthostatic hypotension resulting in his focal deficits.  The patient was instructed to take his Florinef 0.5 mg twice daily rather than 1 mg once daily.  He was also recently hospitalized from 05/30/2021 to 05/31/2021 secondary to altered mental status from a medication effect when he took a combination of doxepin and amitriptyline together.  Prior to that, he was hospitalized  from 04/27/2021 to 04/30/2021 when he presented with left upper extremity weakness and slurred speech.  MRI showed multiple punctate acute infarctions in the right cortical brain.  He noted to have right internal carotid occlusion.  He was followed by stroke neurology as well as IR.  After discussion with IR, the patient and spouse opted for conservative therapy and expectant management.  They were told to continue aspirin and Brilinta and to have repeat CTA of the neck in 1 to 2 months.  The patient initially had a right internal carotid artery stent placed in March 2022.  In April 2022, the patient had a CTA of the head and neck which showed 50% stenosis of the right ICA stent.  He followed up with IR, Dr. Norma Fredrickson in July 2022 and carotid Doppler was unremarkable    Assessment/Plan: Sepsis -Present on admission -Secondary to pneumonia -  Lactic acid 1.9 -Patient presented with fever, tachycardia, and altered mental status  Lobar pneumonia -Switch to Zosyn due to concerns for aspiration component -Check PCT  Acute respiratory failure with hypoxia -Currently stable on 3 L -Wean oxygen as tolerated back to room air  Acute metabolic encephalopathy -Mental status  seems to be improved on the morning of 06/16/2021 -Multifactorial including aspiration pneumonitis, hypnotic medications -Obtain EEG  CKD stage IIIa -Baseline creatinine 1.0-1.2 -Routine BMP  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 Heath Lark   Dementia without behavioral disturbance -continue aricept   Peripheral neuropathy -Continue gabapentin   Hypothyroidism -continue synthroid   RBBB -This is chronic when his previous EKGs were reviewed      Status is: Observation  The patient will require care spanning > 2 midnights and should be moved to inpatient because: Inpatient level of care appropriate due to severity of illness  Dispo: The patient is from: Home              Anticipated d/c is to: Home              Patient currently is not medically stable to d/c.   Difficult to place patient No        Family Communication:  spouse updated 9/25  Consultants:  none  Code Status:  DNR  DVT Prophylaxis:  SSC Lovenox   Procedures: As Listed in Progress Note Above  Antibiotics: Vanc 9/24 Cefepime 9/25 Zosyn 9/25>>      Subjective: Patient denies fevers, chills, headache, chest pain, dyspnea, nausea, vomiting, diarrhea, abdominal pain, dysuria, hematuria, hematochezia, and melena.   Objective: Vitals:   06/15/21 2245 06/15/21 2300 06/15/21 2338 06/16/21 0313  BP:  135/73 126/63 140/67  Pulse: 82 86 80 66  Resp: 20 17 18 19   Temp:  98.9 F (37.2 C) 99 F (37.2 C) 99.1 F (37.3 C)  TempSrc:  Oral Oral Oral  SpO2: 94% 93% 95% 94%  Weight:   81 kg   Height:   6\' 1"  (1.854 m)     Intake/Output Summary (Last 24 hours) at 06/16/2021 0747 Last data filed at 06/16/2021 0545 Gross per 24 hour  Intake 1440 ml  Output 1200 ml   Net 240 ml   Weight change:  Exam:  General:  Pt is alert, follows commands appropriately, not in acute distress HEENT: No icterus, No thrush, No neck mass, Plum/AT Cardiovascular: RRR, S1/S2, no rubs, no gallops Respiratory: bilateral rhonchi.  No wheeze Abdomen: Soft/+BS, non tender, non distended, no guarding Extremities: No edema, No lymphangitis, No petechiae, No rashes, no synovitis   Data Reviewed: I have personally reviewed following labs and imaging studies Basic Metabolic Panel: Recent Labs  Lab 06/12/21 1347 06/15/21 1932 06/16/21 0519  NA 140 140 140  K 4.0 4.0 3.9  CL 100 103 105  CO2 31 29 28   GLUCOSE 96 121* 117*  BUN 30* 17 15  CREATININE 1.54* 1.17 0.88  CALCIUM 9.5 8.8* 8.7*  MG  --   --  1.9  PHOS  --   --  4.1   Liver Function Tests: Recent Labs  Lab 06/12/21 1347 06/15/21 1932 06/16/21 0519  AST 24 25 30   ALT 21 19 16   ALKPHOS 31* 35* 28*  BILITOT 0.6 0.5 0.6  PROT  7.7 7.5 6.2*  ALBUMIN 3.4* 3.4* 2.9*   No results for input(s): LIPASE, AMYLASE in the last 168 hours. No results for input(s): AMMONIA in the last 168 hours. Coagulation Profile: Recent Labs  Lab 06/12/21 1347 06/15/21 1932 06/16/21 0519  INR 1.1 1.0 1.2   CBC: Recent Labs  Lab 06/12/21 1347 06/15/21 1932 06/16/21 0519  WBC 12.7* 9.1 15.2*  NEUTROABS 9.5* 7.7  --   HGB 12.3* 12.8* 10.7*  HCT 38.6* 38.3* 32.8*  MCV 96.3 95.3 94.0  PLT 210 198 178   Cardiac Enzymes: No results for input(s): CKTOTAL, CKMB, CKMBINDEX, TROPONINI in the last 168 hours. BNP: Invalid input(s): POCBNP CBG: No results for input(s): GLUCAP in the last 168 hours. HbA1C: No results for input(s): HGBA1C in the last 72 hours. Urine analysis:    Component Value Date/Time   COLORURINE YELLOW 06/15/2021 1936   APPEARANCEUR CLEAR 06/15/2021 1936   LABSPEC 1.011 06/15/2021 1936   LABSPEC 1.005 06/25/2011 1502   PHURINE 7.0 06/15/2021 1936   GLUCOSEU NEGATIVE 06/15/2021 1936   HGBUR  NEGATIVE 06/15/2021 1936   BILIRUBINUR NEGATIVE 06/15/2021 1936   BILIRUBINUR Negative 06/25/2011 1502   KETONESUR NEGATIVE 06/15/2021 1936   PROTEINUR NEGATIVE 06/15/2021 1936   UROBILINOGEN 0.2 10/09/2011 1634   NITRITE NEGATIVE 06/15/2021 1936   LEUKOCYTESUR NEGATIVE 06/15/2021 1936   LEUKOCYTESUR Negative 06/25/2011 1502   Sepsis Labs: @LABRCNTIP (procalcitonin:4,lacticidven:4) ) Recent Results (from the past 240 hour(s))  Resp Panel by RT-PCR (Flu A&B, Covid) Nasopharyngeal Swab     Status: None   Collection Time: 06/15/21  7:32 PM   Specimen: Nasopharyngeal Swab; Nasopharyngeal(NP) swabs in vial transport medium  Result Value Ref Range Status   SARS Coronavirus 2 by RT PCR NEGATIVE NEGATIVE Final    Comment: (NOTE) SARS-CoV-2 target nucleic acids are NOT DETECTED.  The SARS-CoV-2 RNA is generally detectable in upper respiratory specimens during the acute phase of infection. The lowest concentration of SARS-CoV-2 viral copies this assay can detect is 138 copies/mL. A negative result does not preclude SARS-Cov-2 infection and should not be used as the sole basis for treatment or other patient management decisions. A negative result may occur with  improper specimen collection/handling, submission of specimen other than nasopharyngeal swab, presence of viral mutation(s) within the areas targeted by this assay, and inadequate number of viral copies(<138 copies/mL). A negative result must be combined with clinical observations, patient history, and epidemiological information. The expected result is Negative.  Fact Sheet for Patients:  EntrepreneurPulse.com.au  Fact Sheet for Healthcare Providers:  IncredibleEmployment.be  This test is no t yet approved or cleared by the [place] FDA and  has been authorized for detection and/or diagnosis of SARS-CoV-2 by FDA under an Emergency Use Authorization (EUA). This EUA will remain  in effect  (meaning this test can be used) for the duration of the COVID-19 declaration under Section 564(b)(1) of the Act, 21 U.S.C.section 360bbb-3(b)(1), unless the authorization is terminated  or revoked sooner.       Influenza A by PCR NEGATIVE NEGATIVE Final   Influenza B by PCR NEGATIVE NEGATIVE Final    Comment: (NOTE) The Xpert Xpress SARS-CoV-2/FLU/RSV plus assay is intended as an aid in the diagnosis of influenza from Nasopharyngeal swab specimens and should not be used as a sole basis for treatment. Nasal washings and aspirates are unacceptable for Xpert Xpress SARS-CoV-2/FLU/RSV testing.  Fact Sheet for Patients: EntrepreneurPulse.com.au  Fact Sheet for Healthcare Providers: IncredibleEmployment.be  This test is not yet approved or cleared  by the [place] and has been authorized for detection and/or diagnosis of SARS-CoV-2 by FDA under an Emergency Use Authorization (EUA). This EUA will remain in effect (meaning this test can be used) for the duration of the COVID-19 declaration under Section 564(b)(1) of the Act, 21 U.S.C. section 360bbb-3(b)(1), unless the authorization is terminated or revoked.  Performed at [facility], [address], [place], [place] [address]   MRSA Next Gen by PCR, Nasal     Status: None   Collection Time: 06/16/21 12:04 AM   Specimen: Nasal Mucosa; Nasal Swab  Result Value Ref Range Status   MRSA by PCR Next Gen NOT DETECTED NOT DETECTED Final    Comment: (NOTE) The GeneXpert MRSA Assay (FDA approved for NASAL specimens only), is one component of a comprehensive MRSA colonization surveillance program. It is not intended to diagnose MRSA infection nor to guide or monitor treatment for MRSA infections. Test performance is not FDA approved in patients less than 26 years old. Performed at [facility], [address] [place], [place] [address]      Scheduled Meds:  acetaminophen  650 mg Rectal Once    amLODipine  2.5 mg Oral Daily   aspirin EC  81 mg Oral Daily   dextromethorphan-guaiFENesin  1 tablet Oral BID   enoxaparin (LOVENOX) injection  40 mg Subcutaneous QHS   feeding supplement  237 mL Oral BID BM   fludrocortisone  0.1 mg Oral Daily   gabapentin  900 mg Oral TID   levothyroxine  100 mcg Oral QAC breakfast   rosuvastatin  20 mg Oral Daily   ticagrelor  90 mg Oral BID   Continuous Infusions:  ceFEPime (MAXIPIME) IV 2 g (06/16/21 0624)   lactated ringers 150 mL/hr at 06/16/21 2778   vancomycin      Procedures/Studies: CT HEAD WO CONTRAST  Result Date: 06/12/2021 CLINICAL DATA:  Minor head trauma EXAM: CT HEAD WITHOUT CONTRAST TECHNIQUE: Contiguous axial images were obtained from the base of the skull through the vertex without intravenous contrast. Sagittal and coronal MPR images reconstructed from axial data set. COMPARISON:  06/08/2021 FINDINGS: Brain: Generalized atrophy. Normal ventricular morphology. No midline shift or mass effect. Small vessel chronic ischemic changes of deep cerebral white matter. Small old LEFT occipital infarct. No intracranial hemorrhage, mass lesion, evidence of acute infarction, or extra-axial fluid collection. Vascular: Atherosclerotic calcification of internal carotid and vertebral arteries at skull base Skull: Intact Sinuses/Orbits: Clear Other: N/A IMPRESSION: Atrophy with small vessel chronic ischemic changes of deep cerebral white matter. Small old LEFT occipital infarct. No acute intracranial abnormalities. Electronically Signed   By: Lavonia Dana M.D.   On: 06/12/2021 14:41   CT HEAD WO CONTRAST  Result Date: 06/08/2021 CLINICAL DATA:  Left-sided weakness, disorientation. EXAM: CT HEAD WITHOUT CONTRAST TECHNIQUE: Contiguous axial images were obtained from the base of the skull through the vertex without intravenous contrast. COMPARISON:  CT head and MR brain dated 05/30/2021. FINDINGS: Brain: No evidence of acute infarction, hemorrhage,  hydrocephalus, extra-axial collection or mass lesion/mass effect. There is mild cerebral volume loss with associated ex vacuo dilatation. 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 Vascular: There are vascular calcifications in the carotid siphons. Skull: Normal. Negative for fracture or focal lesion. Sinuses/Orbits: Mild right ethmoid sinus disease versus retention cyst. Other: None. IMPRESSION: No acute intracranial process. Electronically Signed   By: Zerita Boers M.D.   On: 06/08/2021 13:41   CT Head Wo Contrast  Result Date: 05/30/2021  CLINICAL DATA:  Mental status changes of unknown cause. EXAM: CT HEAD WITHOUT CONTRAST TECHNIQUE: Contiguous axial images were obtained from the base of the skull through the vertex without intravenous contrast. COMPARISON:  Comparison made with MRI of April 29, 2021 and CT of April 27, 2021. FINDINGS: Brain: No evidence of acute infarction, hemorrhage, hydrocephalus, extra-axial collection or mass lesion/mass effect. Signs of atrophy and chronic microvascular ischemic change as before. Vascular: No hyperdense vessel or unexpected calcification. Skull: Normal. Negative for fracture or focal lesion. Sinuses/Orbits: Visualized paranasal sinuses and orbits are unremarkable. Other: None IMPRESSION: No acute intracranial pathology. Signs of atrophy and chronic microvascular ischemic change as before. Electronically Signed   By: Zetta Bills M.D.   On: 05/30/2021 11:48   MR BRAIN WO CONTRAST  Result Date: 06/12/2021 CLINICAL DATA:  Head trauma, minor (Age >= 65y) EXAM: MRI HEAD WITHOUT CONTRAST TECHNIQUE: Multiplanar, multiecho pulse sequences of the brain and surrounding structures were obtained without intravenous contrast. COMPARISON:  MRI 06/08/2021. FINDINGS: Brain: No acute infarction, hemorrhage, hydrocephalus, extra-axial collection or mass lesion. Similar atrophy with ex vacuo ventricular dilation. Similar scattered T2 hyperintensities in the  white matter, compatible with chronic microvascular ischemic disease. Similar small remote lacunar infarcts in the left cerebellum and left occipital lobe. Vascular: Unchanged absence of the right ICA flow void with reconstitution of the MCA and ACA flow voids. Skull and upper cervical spine: Normal marrow signal. Sinuses/Orbits: Clear sinuses.  No acute orbital findings. Other: Chronic right mastoid effusion. IMPRESSION: 1. No evidence of acute intracranial abnormality. 2. Similar atrophy and chronic microvascular ischemic disease. 3. Similar absence of the right ICA flow void reconstitution of the ACA and MCA, compatible with right carotid occlusion. Electronically Signed   By: Margaretha Sheffield M.D.   On: 06/12/2021 17:40   MR BRAIN WO CONTRAST  Result Date: 06/08/2021 CLINICAL DATA:  Altered mental status, slurred speech, disoriented, left-sided weakness EXAM: MRI HEAD WITHOUT CONTRAST TECHNIQUE: Multiplanar, multiecho pulse sequences of the brain and surrounding structures were obtained without intravenous contrast. COMPARISON:  Noncontrast CT head obtained earlier the same day, brain MRI 05/30/2021 FINDINGS: Brain: There is no evidence of acute intracranial hemorrhage, extra-axial fluid collection, or acute infarct. There is moderate parenchymal volume loss with commensurate enlargement of the ventricular system. Scattered foci of FLAIR signal abnormality throughout the subcortical and periventricular white matter likely reflects sequela of mild chronic white matter microangiopathy. A small remote lacunar infarct in the left cerebellar hemisphere and small remote infarct in the left occipital lobe are unchanged. There is a punctate chronic microhemorrhage in the left centrum semiovale, nonspecific. There is no suspicious parenchymal signal abnormality. There is no mass lesion. There is no midline shift. Vascular: The right ICA flow void is absent, unchanged. The right MCA and ACA flow voids are normal.  Skull and upper cervical spine: Normal marrow signal. Sinuses/Orbits: The paranasal sinuses are clear. Bilateral lens implants are in place. The globes and orbits are otherwise unremarkable. Other: A right mastoid effusion is again seen. IMPRESSION: 1. No acute intracranial pathology. 2. Unchanged mild parenchymal volume loss, chronic white matter microangiopathy, and small remote infarcts as above. 3. Unchanged absence of the right ICA flow void with reconstitution of the ACA and MCA flow voids. Electronically Signed   By: Valetta Mole M.D.   On: 06/08/2021 16:29   MR Brain Wo Contrast (neuro protocol)  Result Date: 05/30/2021 CLINICAL DATA:  Altered mental status, right-sided weakness EXAM: MRI HEAD WITHOUT CONTRAST TECHNIQUE: Multiplanar, multiecho pulse  sequences of the brain and surrounding structures were obtained without intravenous contrast. COMPARISON:  Same day CT head FINDINGS: Brain: There is no evidence of acute intracranial hemorrhage, extra-axial fluid collection, or acute infarct. There is a remote infarct in the left occipital lobe small foci of cortically based FLAIR signal abnormality most notable in the right frontal lobe are consistent with evolving infarcts as seen on the prior MRI from 04/29/2021. There is a small remote lacunar infarct in the left cerebellar hemisphere. There is mild parenchymal volume loss. Scattered additional of FLAIR signal abnormality in the subcortical and periventricular white matter likely reflect mild chronic white matter microangiopathy. There is no suspicious parenchymal signal abnormality. There is no mass lesion. There is no midline shift. Vascular: The right ICA flow void is absent. The ACA and MCA flow voids are present, presumably from collateral flow through the anterior communicating artery. This is in keeping with the findings on the CTA head from 04/27/2021 which demonstrated ICA occlusion. Skull and upper cervical spine: Normal marrow signal.  Sinuses/Orbits: The paranasal sinuses are clear. Bilateral lens implants are in place. The globes and orbits are otherwise unremarkable. Other: There is a small right mastoid effusion, unchanged. IMPRESSION: 1. No acute intracranial pathology. 2. Unchanged remote infarct in the left occipital lobe and mild chronic white matter microangiopathy. 3. Absent right intracranial ICA flow void with reconstitution of the ACA and MCA, in keeping with the findings on the CTA head from 04/29/2021. Electronically Signed   By: Valetta Mole M.D.   On: 05/30/2021 13:58   DG Chest Port 1 View  Result Date: 06/15/2021 CLINICAL DATA:  Shortness of breath EXAM: PORTABLE CHEST 1 VIEW COMPARISON:  06/08/2021 FINDINGS: Post sternotomy changes. Borderline cardiomegaly with mild central congestion. Patchy airspace opacities at the bases. Aortic atherosclerosis. No pneumothorax. IMPRESSION: 1. Borderline cardiomegaly with vascular congestion. 2. Patchy left greater than right basilar airspace disease, possible pneumonia Electronically Signed   By: Donavan Foil M.D.   On: 06/15/2021 19:58   DG Chest Portable 1 View  Result Date: 06/08/2021 CLINICAL DATA:  Confusion, incontinence EXAM: PORTABLE CHEST 1 VIEW COMPARISON:  05/30/2021 FINDINGS: Cardiomegaly status post median sternotomy. Both lungs are clear. The visualized skeletal structures are unremarkable. IMPRESSION: Cardiomegaly without acute abnormality of the lungs in AP portable projection. Electronically Signed   By: Eddie Candle M.D.   On: 06/08/2021 14:41   DG Chest Port 1 View  Result Date: 05/30/2021 CLINICAL DATA:  Altered mental status. EXAM: PORTABLE CHEST 1 VIEW COMPARISON:  Chest x-ray dated April 28, 2021. FINDINGS: Unchanged mild cardiomegaly. Normal pulmonary vascularity. Retrocardiac left lower lobe opacity. No pleural effusion or pneumothorax. No acute osseous abnormality. IMPRESSION: 1. Retrocardiac left lower lobe atelectasis versus infiltrate. Electronically  Signed   By: Titus Dubin M.D.   On: 05/30/2021 11:37   DG Knee Complete 4 Views Left  Result Date: 06/12/2021 CLINICAL DATA:  Left knee weakness. EXAM: LEFT KNEE - COMPLETE 4+ VIEW COMPARISON:  None. FINDINGS: No fracture or dislocation. Left knee joint spaces appear preserved. There is minimal spurring the tibial spines. No evidence of chondrocalcinosis. No joint effusion. Regional soft tissues appear normal. IMPRESSION: No explanation for patient's left knee weakness. Electronically Signed   By: Sandi Mariscal M.D.   On: 06/12/2021 15:05   CT CHEST ABDOMEN PELVIS WO CONTRAST  Result Date: 05/30/2021 CLINICAL DATA:  Abdominal distension AKI new EXAM: CT CHEST, ABDOMEN AND PELVIS WITHOUT CONTRAST TECHNIQUE: Multidetector CT imaging of the chest, abdomen and  pelvis was performed following the standard protocol without IV contrast. COMPARISON:  None. FINDINGS: Chest: Cardiovascular: Ascending thoracic aorta measures up to 4.0 x 3.8 cm. Diffuse atherosclerotic calcifications. Coronary artery calcifications. Normal heart size. No pericardial effusion. Mediastinum/Nodes: No enlarged mediastinal, hilar, or axillary lymph nodes. The thyroid gland appears normal. Lungs/Pleura: No pleural effusion. No pneumothorax. Ground-glass consolidation in the dependent bilateral lower lobes. No suspicious pulmonary nodules. Abdomen: Hepatobiliary: The liver is normal in size without focal abnormality. No intrahepatic or extrahepatic biliary ductal dilation. The gallbladder appears normal. Spleen: Normal in size without focal abnormality. Pancreas: No pancreatic ductal dilatation or surrounding inflammatory changes. Adrenals/Urinary Tract: Adrenal glands are unremarkable. Kidneys are normal, without renal calculi or hydronephrosis. Right simple renal cyst. Bladder is unremarkable. Stomach/Bowel: The stomach, small bowel and large bowel are normal in caliber without abnormal wall thickening or surrounding inflammatory changes.  Stool ball in the rectal vault measures up to 8 cm. Reproductive: Prostate is unremarkable. Lymphatic: No enlarged lymph nodes in the abdomen or pelvis. Vasculature: The abdominal aorta is normal in caliber. Atherosclerotic calcifications, particularly of the femoral vessels. Other: No abdominopelvic ascites. Musculoskeletal: No aggressive osseous lesions. Multilevel degenerative changes of spine. Median sternotomy wires. The soft tissues are unremarkable. IMPRESSION: Chest: 1. Ground-glass consolidation in the dependent lower lobes, likely atelectasis. However, aspiration/pneumonia remains a differential consideration. 2. Ascending thoracic aorta measures up to 4.0 cm. Recommend annual imaging followup by CTA or MRA. This recommendation follows 2010 ACCF/AHA/AATS/ACR/ASA/SCA/SCAI/SIR/STS/SVM Guidelines for the Diagnosis and Management of Patients with Thoracic Aortic Disease. Circulation. 2010; 121: W258-N277. Aortic aneurysm NOS (ICD10-I71.9) 3. Coronary artery calcifications. Abdomen/pelvis: 1. Stool ball in the rectum measures up to 8 cm. No other acute findings in the abdomen or pelvis. Electronically Signed   By: Albin Felling M.D.   On: 05/30/2021 14:47    Orson Eva, DO  [facility]  If 7PM-7AM, please contact night-coverage [email or web address] Password [facility] 06/16/2021, 7:47 AM   LOS: 0 days

## 2021-06-16 NOTE — Progress Notes (Signed)
Received report from [place], Therapist, sports. Assumed pt care at 0700. Pt resting comfortably in bed in no acute distress. A&O. Denies pain. VSS. Call bell within reach. Will continue to monitor.

## 2021-06-17 ENCOUNTER — Other Ambulatory Visit: Payer: Medicare Other

## 2021-06-17 ENCOUNTER — Inpatient Hospital Stay (HOSPITAL_COMMUNITY)
Admit: 2021-06-17 | Discharge: 2021-06-17 | Disposition: A | Discharge status: Home / Self Care | Payer: Medicare Other | Attending: Cardiovascular Disease | Admitting: Cardiovascular Disease

## 2021-06-17 ENCOUNTER — Inpatient Hospital Stay: Admission: RE | Admit: 2021-06-17 | Payer: Medicare Other | Source: Ambulatory Visit

## 2021-06-17 DIAGNOSIS — R4182 Altered mental status, unspecified: Secondary | ICD-10-CM | POA: Diagnosis not present

## 2021-06-17 DIAGNOSIS — J9601 Acute respiratory failure with hypoxia: Secondary | ICD-10-CM | POA: Diagnosis not present

## 2021-06-17 DIAGNOSIS — J189 Pneumonia, unspecified organism: Secondary | ICD-10-CM | POA: Diagnosis not present

## 2021-06-17 DIAGNOSIS — D472 Monoclonal gammopathy: Secondary | ICD-10-CM | POA: Diagnosis not present

## 2021-06-17 DIAGNOSIS — J181 Lobar pneumonia, unspecified organism: Secondary | ICD-10-CM | POA: Diagnosis not present

## 2021-06-17 LAB — PROCALCITONIN: Procalcitonin: 6.65 ng/mL

## 2021-06-17 LAB — COMPREHENSIVE METABOLIC PANEL
ALT: 17 U/L (ref 0–44)
AST: 35 U/L (ref 15–41)
Albumin: 3.4 g/dL — ABNORMAL LOW (ref 3.5–5.0)
Alkaline Phosphatase: 36 U/L — ABNORMAL LOW (ref 38–126)
Anion gap: 11 (ref 5–15)
BUN: 12 mg/dL (ref 8–23)
CO2: 28 mmol/L (ref 22–32)
Calcium: 9.5 mg/dL (ref 8.9–10.3)
Chloride: 101 mmol/L (ref 98–111)
Creatinine, Ser: 1.04 mg/dL (ref 0.61–1.24)
GFR, Estimated: >60 mL/min (ref >60)
Glucose, Bld: 104 mg/dL — ABNORMAL HIGH (ref 70–99)
Potassium: 3.4 mmol/L — ABNORMAL LOW (ref 3.5–5.1)
Sodium: 140 mmol/L (ref 135–145)
Total Bilirubin: 0.9 mg/dL (ref 0.3–1.2)
Total Protein: 7.6 g/dL (ref 6.5–8.1)

## 2021-06-17 LAB — CBC WITH DIFFERENTIAL/PLATELET
Abs Immature Granulocytes: 0.05 10*3/uL (ref 0.00–0.07)
Basophils Absolute: 0.1 10*3/uL (ref 0.0–0.1)
Basophils Relative: 1 %
Eosinophils Absolute: 0.2 10*3/uL (ref 0.0–0.5)
Eosinophils Relative: 1 %
HCT: 37.1 % — ABNORMAL LOW (ref 39.0–52.0)
Hemoglobin: 12.1 g/dL — ABNORMAL LOW (ref 13.0–17.0)
Immature Granulocytes: 0 %
Lymphocytes Relative: 15 %
Lymphs Abs: 1.9 10*3/uL (ref 0.7–4.0)
MCH: 30.7 pg (ref 26.0–34.0)
MCHC: 32.6 g/dL (ref 30.0–36.0)
MCV: 94.2 fL (ref 80.0–100.0)
Monocytes Absolute: 0.7 10*3/uL (ref 0.1–1.0)
Monocytes Relative: 6 %
Neutro Abs: 9.8 10*3/uL — ABNORMAL HIGH (ref 1.7–7.7)
Neutrophils Relative %: 77 %
Platelets: 196 10*3/uL (ref 150–400)
RBC: 3.94 MIL/uL — ABNORMAL LOW (ref 4.22–5.81)
RDW: 14.9 % (ref 11.5–15.5)
WBC: 12.7 10*3/uL — ABNORMAL HIGH (ref 4.0–10.5)
nRBC: 0 % (ref 0.0–0.2)

## 2021-06-17 LAB — URINE CULTURE: Culture: <10000 — AB

## 2021-06-17 LAB — RESP PANEL BY RT-PCR (FLU A&B, COVID) ARPGX2
Influenza A by PCR: NEGATIVE
Influenza B by PCR: NEGATIVE
SARS Coronavirus 2 by RT PCR: NEGATIVE

## 2021-06-17 MED ORDER — POTASSIUM CHLORIDE CRYS ER 20 MEQ PO TBCR
20.0000 meq | EXTENDED_RELEASE_TABLET | Freq: Once | ORAL | Status: AC
  Administered 2021-06-17: 20 meq via ORAL
  Filled 2021-06-17 (×2): qty 1

## 2021-06-17 NOTE — Plan of Care (Signed)
  Problem: Acute Rehab PT Goals(only PT should resolve) Goal: Pt Will Go Supine/Side To Sit Outcome: Progressing Flowsheets (Taken 06/17/2021 1438) Pt will go Supine/Side to Sit:  with modified independence  Independently Goal: Patient Will Transfer Sit To/From Stand Outcome: Progressing Flowsheets (Taken 06/17/2021 1438) Patient will transfer sit to/from stand: with modified independence Goal: Pt Will Transfer Bed To Chair/Chair To Bed Outcome: Progressing Flowsheets (Taken 06/17/2021 1438) Pt will Transfer Bed to Chair/Chair to Bed: with modified independence Goal: Pt Will Ambulate Outcome: Progressing Flowsheets (Taken 06/17/2021 1438) Pt will Ambulate:  > 125 feet  with supervision  with modified independence  with least restrictive assistive device   2:39 PM, 06/17/21 Lonell Grandchild, MPT Physical Therapist with [facility] 336 [phone] office [phone] mobile phone

## 2021-06-17 NOTE — Evaluation (Signed)
Physical Therapy Evaluation Patient Details Name: George Bradley MRN: [id number] DOB: September 14, 1941 Today's Date: 06/17/2021  History of Present Illness  George Bradley is an 80 y.o. male with medical history significant for stroke, right carotid stenosis, coronary disease, CKD stage III, hyperlipidemia, oropharyngeal cancer/tongue cancer, hypothyroidism, cognitive impairment, MGUS, orthostatic hypotension, insomnia who presents to the emergency department via EMS due to shortness of breath.  Patient complained of coming to the hospital due to shortness of breath, however he was unable to provide details regarding please sickness.  History was obtained from ED physician and ED medical record.  Patient's family noted that patient was hypoxic also having respiratory difficulty, so EMS was activated and on arrival to the emergency room, supplemental oxygen via NRB at 15 L/min was provided and patient was taken to the ED for further evaluation and management.  He was recently admitted from 9/8-9/19 due to acute toxic/metabolic abnormalities secondary to aspiration pneumonitis.  Patient was also admitted from 8/6-8/9 due to weakness and slurred speech and MRI showed multiple punctate acute infarcts affecting right cortical brain, and was also diagnosed with right carotid occlusion, recommended to have a follow-up CT of head in 1 to 2 months to evaluate for right ICA stent.   Clinical Impression  Patient functioning near baseline for functional mobility and gait demonstrating good return for sitting up at bedside, transferring to chair and ambulating in room/hallway without loss of balance requiring occasional hand held assist.  Patient tolerated sitting up in chair after therapy with his spouse present in room.  Patient will benefit from continued physical therapy in hospital and recommended venue below to increase strength, balance, endurance for safe ADLs and gait.          Recommendations for follow up therapy  are one component of a multi-disciplinary discharge planning process, led by the attending physician.  Recommendations may be updated based on patient status, additional functional criteria and insurance authorization.  Follow Up Recommendations Home health PT;Supervision - Intermittent    Equipment Recommendations  None recommended by PT    Recommendations for Other Services       Precautions / Restrictions Precautions Precautions: Fall Restrictions Weight Bearing Restrictions: No      Mobility  Bed Mobility Overal bed mobility: Modified Independent             General bed mobility comments: slightly labored    Transfers Overall transfer level: Needs assistance Equipment used: None Transfers: Sit to/from Stand;Stand Pivot Transfers Sit to Stand: Supervision Stand pivot transfers: Supervision       General transfer comment: slightly labored movement  Ambulation/Gait Ambulation/Gait assistance: Supervision;Min guard Gait Distance (Feet): 80 Feet Assistive device: 1 person hand held assist;None Gait Pattern/deviations: Decreased step length - right;Decreased step length - left;Decreased stride length Gait velocity: decreased   General Gait Details: slightly labored cadence requiring occasional hand held assist, no loss of balance and limited mostly due to fatigue  Stairs            Wheelchair Mobility    Modified Rankin (Stroke Patients Only)       Balance Overall balance assessment: Mild deficits observed, not formally tested                                           Pertinent Vitals/Pain Pain Assessment: No/denies pain    Home Living Family/patient expects to be  discharged to:: Private residence Living Arrangements: Spouse/significant other Available Help at Discharge: Family;Available 24 hours/day Type of Home: House Home Access: Stairs to enter;Level entry Entrance Stairs-Rails: Right Entrance Stairs-Number of Steps: 3  thru the garage Home Layout: Two level;Able to live on main level with bedroom/bathroom Home Equipment: Shower seat - built in;Hand held Tourist information centre manager - 2 wheels;Cane - single point;Marcus - 4 wheels;Grab bars - toilet;Grab bars - tub/shower;Hospital bed Additional Comments: Wife is a retired acute PT    Prior Function Level of Independence: Independent         Comments: Hydrographic surveyor, drives     Journalist, newspaper   Dominant Hand: Right    Extremity/Trunk Assessment   Upper Extremity Assessment Upper Extremity Assessment: Overall WFL for tasks assessed    Lower Extremity Assessment Lower Extremity Assessment: Generalized weakness    Cervical / Trunk Assessment Cervical / Trunk Assessment: Kyphotic  Communication   Communication: No difficulties  Cognition Arousal/Alertness: Awake/alert Behavior During Therapy: WFL for tasks assessed/performed Overall Cognitive Status: Within Functional Limits for tasks assessed                                        General Comments      Exercises     Assessment/Plan    PT Assessment Patient needs continued PT services  PT Problem List Decreased strength;Decreased activity tolerance;Decreased balance;Decreased mobility       PT Treatment Interventions DME instruction;Gait training;Stair training;Functional mobility training;Therapeutic activities;Therapeutic exercise;Balance training;Patient/family education    PT Goals (Current goals can be found in the Care Plan section)  Acute Rehab PT Goals Patient Stated Goal: return home with family to assist PT Goal Formulation: With patient/family Time For Goal Achievement: 06/21/21 Potential to Achieve Goals: Good    Frequency     Barriers to discharge        Co-evaluation               AM-PAC PT "6 Clicks" Mobility  Outcome Measure Help needed turning from your back to your side while in a flat bed without  using bedrails?: None Help needed moving from lying on your back to sitting on the side of a flat bed without using bedrails?: None Help needed moving to and from a bed to a chair (including a wheelchair)?: A Little Help needed standing up from a chair using your arms (e.g., wheelchair or bedside chair)?: A Little Help needed to walk in hospital room?: A Little Help needed climbing 3-5 steps with a railing? : A Little 6 Click Score: 20    End of Session   Activity Tolerance: Patient tolerated treatment well;Patient limited by fatigue Patient left: in chair;with call bell/phone within reach;with family/visitor present Nurse Communication: Mobility status PT Visit Diagnosis: Unsteadiness on feet (R26.81);Other abnormalities of gait and mobility (R26.89);Muscle weakness (generalized) (M62.81)    Time: 7564-3329 PT Time Calculation (min) (ACUTE ONLY): 26 min   Charges:   PT Evaluation $PT Eval Moderate Complexity: 1 Mod PT Treatments $Therapeutic Activity: 23-37 mins        2:34 PM, 06/17/21 Lonell Grandchild, MPT Physical Therapist with [facility] 336 [phone] office [phone] mobile phone

## 2021-06-17 NOTE — TOC Initial Note (Signed)
Transition of Care [facility]) - Initial/Assessment Note    Patient Details  Name: George Bradley MRN: [id number] Date of Birth: 1941/02/12  Transition of Care [facility]) CM/SW Contact:    Boneta Lucks, RN Phone Number: 06/17/2021, 1:32 PM  Clinical Narrative:    Patient admitted with Pneumonia. Patient has a high score for readmission. PT is recommending HHPT. TOC spoke with [name], wife, she is at the bedside. She states patient is active with [facility] for HHPT/RN. TOC requesting resumption orders.  They are waiting on Hospital bed from [facility]. Should be delivered today. They had put a hold on order, patient did not want at first. They do not need any other equipment in the home.            Expected Discharge Plan: [place] Barriers to Discharge: Continued Medical Work up  Patient Goals and CMS Choice Patient states their goals for this hospitalization and ongoing recovery are:: To return home. CMS Medicare.gov Compare Post Acute Care list provided to:: Patient Represenative (must comment) Choice offered to / list presented to : Spouse  Expected Discharge Plan and Services Expected Discharge Plan: [place]      Living arrangements for the past 2 months: Single Family Home                  Prior Living Arrangements/Services Living arrangements for the past 2 months: Single Family Home Lives with:: Spouse Patient language and need for interpreter reviewed:: Yes Do you feel safe going back to the place where you live?: Yes      Need for Family Participation in Patient Care: Yes (Comment) Care giver support system in place?: Yes (comment)   Criminal Activity/Legal Involvement Pertinent to Current Situation/Hospitalization: No - Comment as needed  Activities of Daily Living Home Assistive Devices/Equipment: None ADL Screening (condition at time of admission) Patient's cognitive ability adequate to safely complete daily activities?: Yes Is the patient  deaf or have difficulty hearing?: No Does the patient have difficulty seeing, even when wearing glasses/contacts?: No Does the patient have difficulty concentrating, remembering, or making decisions?: No Patient able to express need for assistance with ADLs?: Yes Does the patient have difficulty dressing or bathing?: Yes Independently performs ADLs?: Yes (appropriate for developmental age) Does the patient have difficulty walking or climbing stairs?: Yes Weakness of Legs: Both Weakness of Arms/Hands: None  Permission Sought/Granted      Share Information with NAME: [name]     Permission granted to share info w Relationship: Wife     Emotional Assessment     Affect (typically observed): Accepting   Alcohol / Substance Use: Not Applicable Psych Involvement: No (comment)  Admission diagnosis:  Hypoxia [R09.02] HCAP (healthcare-associated pneumonia) [J18.9] Sepsis due to undetermined organism [facility]) [A41.9] Patient Active Problem List   Diagnosis Date Noted   Lobar pneumonia ([place]) 06/16/2021   HCAP (healthcare-associated pneumonia) 06/15/2021   Hypoalbuminemia due to protein-calorie malnutrition ([place]) 06/15/2021   Prolonged QT interval 06/15/2021   Altered mental status    AKI (acute kidney injury) ([place]) 05/30/2021   Insomnia 05/28/2021   Protein-calorie malnutrition, severe 04/29/2021   Weakness 04/28/2021   Left-sided weakness 04/27/2021   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   Gait disturbance 04/01/2021   Dysesthesia 04/01/2021   Sepsis due to undetermined organism ([place]) 01/24/2021   Aspiration pneumonitis ([place]) 01/23/2021   Pneumonia 01/21/2021   Sepsis ([place]) 12/26/2020   Acute respiratory failure with hypoxia ([place]) 12/26/2020   Aspiration  pneumonia ([place]) 12/26/2020   Dementia ([place]) 12/26/2020   Acute encephalopathy 12/26/2020   Cerebrovascular accident (CVA) ([place]) 12/19/2020   Acute right MCA stroke ([place]) 12/19/2020   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   Spinal stenosis of lumbosacral region 11/13/2020   Numbness 11/13/2020   Weakness of both lower extremities 11/13/2020   Radiation-induced esophageal stricture 07/05/2019   Dizziness 08/18/2017   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   Constipation 03/23/2014   Dysphagia 03/23/2014   Neuropathy due to chemotherapeutic drug ([place]) 03/23/2014   Xerostomia 06/10/2013   Thrombocytopenia ([place]) 06/10/2013   S/P radiation therapy    Hypothyroidism 05/27/2012   Orthostatic hypotension 05/11/2012   Epigastric pain 05/11/2012   History of tongue cancer 11/17/2011   Depression 10/01/2011   Renal artery stenosis, native, bilateral ([place]) 09/03/2011   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   Angina pectoris ([place]) 02/20/2011   Hypercholesterolemia 02/20/2011   Aortic valve stenosis, mild 02/20/2011   PCP:  [name], MD Pharmacy:   [facility] [place], [place] [address] [place] [address] Phone: [phone] Fax: [phone]  Readmission Risk Interventions Readmission Risk Prevention Plan 06/17/2021  Transportation Screening Complete  HRI or Home Care Consult Complete  Social Work Consult for [place] Planning/Counseling Complete  Palliative Care Screening Complete  Medication Review Press photographer) Complete  Some recent data might be hidden

## 2021-06-17 NOTE — Procedures (Signed)
Patient Name: George Bradley  MRN: [id number]  Epilepsy Attending: Lora Havens  Referring Physician/Provider: Dr Shanon Brow Tat Date: 06/17/2021 Duration: 22.50 mins  Patient history: 80yo M with ams. EEG to evaluate for seizure  Level of alertness: Awake, asleep  AEDs during EEG study: Gabapentin  Technical aspects: This EEG study was done with scalp electrodes positioned according to the 10-20 International system of electrode placement. Electrical activity was acquired at a sampling rate of 500Hz  and reviewed with a high frequency filter of 70Hz  and a low frequency filter of 1Hz . EEG data were recorded continuously and digitally stored.   Description: The posterior dominant rhythm consists of 7.5Hz  activity of moderate voltage (25-35 uV) seen predominantly in posterior head regions, symmetric and reactive to eye opening and eye closing. Sleep was characterized by vertex waves, sleep spindles (12 to 14 Hz), maximal frontocentral region. EEG showed intermittent generalized 3 to 6 Hz theta-delta slowing. Hyperventilation and photic stimulation were not performed.     ABNORMALITY - Intermittent slow, generalized  IMPRESSION: This study is suggestive of mild diffuse encephalopathy, nonspecific etiology. No seizures or epileptiform discharges were seen throughout the recording.  [name] Barbra Sarks

## 2021-06-17 NOTE — Progress Notes (Signed)
PROGRESS NOTE  George Bradley QVZ:[id number] DOB: 07-Jul-1941 DOA: 06/15/2021 PCP: [name], MD Brief History:  80 year old male with a history of stroke, right carotid stenosis, coronary disease, CKD stage III, hyperlipidemia, oropharyngeal cancer/tongue cancer, hypothyroidism, cognitive impairment, MGUS, orthostatic hypotension, insomnia presenting with recurrent episodes of "shaking all over" accompanied by generalized weakness and confusion.  The patient is a difficult historian secondary to his cognitive impairment.  History is obtained from speaking with the patient's spouse.  Review of the medical record shows that the patient has had these episodes for a few weeks according to the patient's spouse.  She states that the episodes last about 20 to 30 minutes.  She states that " he is like dead weight" during these episodes; however, the patient is awake and speaks with the patient spouse albeit confused during these episodes.  She states that they checked his vitals during these episodes and he has a "normal" blood pressure.  Normally, the patient has been on a regimen of permissive hypertension secondary to his recurrent orthostatic hypotension.  He has not had any fevers, chills, chest pain, shortness breath, cough, hemoptysis.  However, when EMS was activated they noted the patient to have hypoxia and placed him on supplemental oxygen.  In addition, the spouse states that the patient had an episode of emesis yesterday without any blood.  He has not been having any diarrhea, abdominal pain, dysuria, hematuria.  He has been started back on his pyridostigmine after a period of time discontinuation secondary to concerns for interaction with his Florinef.  He has not been started any other medications.  He continues on amitriptyline for his chronic insomnia.  As noted in the past, the patient has chronic issues with insomnia for which she has taken intermittently Ambien, doxepin, and Seroquel in  the past. The patient has had numerous hospitalizations and ED visits for a variety of issues.  Most recently, the patient had an ED visit on 06/12/2021.  Because of the above complaints and concerns for left-sided neglect.  MRI of the brain on that day was negative for any acute findings.  It was felt that the patient likely had a watershed type effect secondary to his orthostatic hypotension resulting in his focal deficits.  The patient was instructed to take his Florinef 0.5 mg twice daily rather than 1 mg once daily.   He was also recently hospitalized from 05/30/2021 to 05/31/2021 secondary to altered mental status from a medication effect when he took a combination of doxepin and amitriptyline together.   Prior to that, he was hospitalized  from 04/27/2021 to 04/30/2021 when he presented with left upper extremity weakness and slurred speech.  MRI showed multiple punctate acute infarctions in the right cortical brain.  He noted to have right internal carotid occlusion.  He was followed by stroke neurology as well as IR.  After discussion with IR, the patient and spouse opted for conservative therapy and expectant management.  They were told to continue aspirin and Brilinta and to have repeat CTA of the neck in 1 to 2 months.  The patient initially had a right internal carotid artery stent placed in March 2022.  In April 2022, the patient had a CTA of the head and neck which showed 50% stenosis of the right ICA stent.  He followed up with IR, Dr. Norma Fredrickson in July 2022 and carotid Doppler was unremarkable       Assessment/Plan: Sepsis -Present on  admission -Secondary to pneumonia -Lactic acid 1.9 -Patient presented with fever, tachycardia, and altered mental status -sepsis physiology resolved   Lobar pneumonia -Switch to Zosyn due to concerns for aspiration component -Check PCT 7.66>>6.65 -speech therapy eval>>regular with thin   Acute respiratory failure with hypoxia -Currently stable on 3  L -Wean oxygen as tolerated back to room air   Acute metabolic encephalopathy -Mental status seems to be improved on the morning of 06/16/2021 -Multifactorial including aspiration pneumonitis, hypnotic medications -9/26--mental status at baseline -Obtain EEG   CKD stage IIIa -Baseline creatinine 1.0-1.2 -Routine BMP   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 Heath Lark   Dementia without behavioral disturbance -continue aricept   Peripheral neuropathy -Continue gabapentin   Hypothyroidism -continue synthroid   RBBB -This is chronic when his previous EKGs were reviewed   Hypokalemia -replete -mag 1.9       Status is: Observation   The patient will require care spanning > 2 midnights and should be moved to inpatient because: Inpatient level of care appropriate due to severity of illness   Dispo: The patient is from: Home              Anticipated d/c is to: Home              Patient currently is not medically stable to d/c.              Difficult to place patient No               Family Communication:  spouse updated 9/26   Consultants:  none   Code Status:  DNR   DVT Prophylaxis:  [place] Lovenox     Procedures: As Listed in Progress Note Above   Antibiotics: Vanc 9/24 Cefepime 9/25 Zosyn 9/25>>     Subjective: Patient denies fevers, chills, headache, chest pain, dyspnea, nausea, vomiting, diarrhea, abdominal pain, dysuria, hematuria, hematochezia, and melena.   Objective: Vitals:   06/16/21 1346 06/16/21 2102 06/17/21 0603 06/17/21 0746  BP: (!) 179/84 (!) 179/89 (!) 145/95 (!) 169/89  Pulse: 79 80 66 75  Resp: 16  20 12   Temp: 98.2 F (36.8 C) 98.2 F (36.8 C) 98 F (36.7 C) 98.5 F (36.9 C)  TempSrc: Oral Oral  Oral Oral  SpO2: 97% 93% 95% 96%  Weight:      Height:        Intake/Output Summary (Last 24 hours) at 06/17/2021 1452 Last data filed at 06/17/2021 1100 Gross per 24 hour  Intake 480 ml  Output 2775 ml  Net -2295 ml   Weight change:  Exam:  General:  Pt is alert, follows commands appropriately, not in acute distress HEENT: No icterus, No thrush, No neck mass, [place]/AT Cardiovascular: RRR, S1/S2, no rubs, no gallops Respiratory: bibasilar rales. No wheeze Abdomen: Soft/+BS, non tender, non distended, no guarding Extremities: No edema, No lymphangitis, No petechiae, No rashes, no synovitis   Data Reviewed: I have personally reviewed following labs and imaging studies Basic Metabolic Panel: Recent Labs  Lab 06/12/21 1347 06/15/21 1932 06/16/21 0519 06/17/21 0548  NA 140 140 140 140  K 4.0 4.0 3.9 3.4*  CL 100 103 105 101  CO2 31 29 28 28   GLUCOSE 96 121* 117* 104*  BUN 30* 17 15 12  CREATININE 1.54* 1.17 0.88 1.04  CALCIUM 9.5 8.8* 8.7* 9.5  MG  --   --  1.9  --   PHOS  --   --  4.1  --    Liver Function Tests: Recent Labs  Lab 06/12/21 1347 06/15/21 1932 06/16/21 0519 06/17/21 0548  AST 24 25 30  35  ALT 21 19 16 17   ALKPHOS 31* 35* 28* 36*  BILITOT 0.6 0.5 0.6 0.9  PROT 7.7 7.5 6.2* 7.6  ALBUMIN 3.4* 3.4* 2.9* 3.4*   No results for input(s): LIPASE, AMYLASE in the last 168 hours. No results for input(s): AMMONIA in the last 168 hours. Coagulation Profile: Recent Labs  Lab 06/12/21 1347 06/15/21 1932 06/16/21 0519  INR 1.1 1.0 1.2   CBC: Recent Labs  Lab 06/12/21 1347 06/15/21 1932 06/16/21 0519 06/17/21 0548  WBC 12.7* 9.1 15.2* 12.7*  NEUTROABS 9.5* 7.7  --  9.8*  HGB 12.3* 12.8* 10.7* 12.1*  HCT 38.6* 38.3* 32.8* 37.1*  MCV 96.3 95.3 94.0 94.2  PLT 210 198 178 196   Cardiac Enzymes: No results for input(s): CKTOTAL, CKMB, CKMBINDEX, TROPONINI in the last 168 hours. BNP: Invalid input(s): POCBNP CBG: No results for input(s): GLUCAP in  the last 168 hours. HbA1C: No results for input(s): HGBA1C in the last 72 hours. Urine analysis:    Component Value Date/Time   COLORURINE YELLOW 06/15/2021 1936   APPEARANCEUR CLEAR 06/15/2021 1936   LABSPEC 1.011 06/15/2021 1936   LABSPEC 1.005 06/25/2011 1502   PHURINE 7.0 06/15/2021 1936   GLUCOSEU NEGATIVE 06/15/2021 1936   HGBUR NEGATIVE 06/15/2021 1936   BILIRUBINUR NEGATIVE 06/15/2021 1936   BILIRUBINUR Negative 06/25/2011 1502   KETONESUR NEGATIVE 06/15/2021 1936   PROTEINUR NEGATIVE 06/15/2021 1936   UROBILINOGEN 0.2 10/09/2011 1634   NITRITE NEGATIVE 06/15/2021 1936   LEUKOCYTESUR NEGATIVE 06/15/2021 1936   LEUKOCYTESUR Negative 06/25/2011 1502   Sepsis Labs: @LABRCNTIP (procalcitonin:4,lacticidven:4) ) Recent Results (from the past 240 hour(s))  Resp Panel by RT-PCR (Flu A&B, Covid) Nasopharyngeal Swab     Status: None   Collection Time: 06/15/21  7:32 PM   Specimen: Nasopharyngeal Swab; Nasopharyngeal(NP) swabs in vial transport medium  Result Value Ref Range Status   SARS Coronavirus 2 by RT PCR NEGATIVE NEGATIVE Final    Comment: (NOTE) SARS-CoV-2 target nucleic acids are NOT DETECTED.  The SARS-CoV-2 RNA is generally detectable in upper respiratory specimens during the acute phase of infection. The lowest concentration of SARS-CoV-2 viral copies this assay can detect is 138 copies/mL. A negative result does not preclude SARS-Cov-2 infection and should not be used as the sole basis for treatment or other patient management decisions. A negative result may occur with  improper specimen collection/handling, submission of specimen other than nasopharyngeal swab, presence of viral mutation(s) within the areas targeted by this assay, and inadequate number of viral copies(<138 copies/mL). A negative result must be combined with clinical observations, patient history, and epidemiological information. The expected result is Negative.  Fact Sheet for Patients:   EntrepreneurPulse.com.au  Fact Sheet for Healthcare Providers:  IncredibleEmployment.be  This test is no t yet approved or cleared by the [place] FDA and  has been authorized for detection and/or diagnosis of SARS-CoV-2 by FDA under an Emergency Use Authorization (EUA). This EUA will remain  in effect (meaning this test can be used) for the duration of the COVID-19 declaration under Section 564(b)(1) of the Act, 21 U.S.C.section 360bbb-3(b)(1), unless the authorization is terminated  or revoked sooner.  Influenza A by PCR NEGATIVE NEGATIVE Final   Influenza B by PCR NEGATIVE NEGATIVE Final    Comment: (NOTE) The Xpert Xpress SARS-CoV-2/FLU/RSV plus assay is intended as an aid in the diagnosis of influenza from Nasopharyngeal swab specimens and should not be used as a sole basis for treatment. Nasal washings and aspirates are unacceptable for Xpert Xpress SARS-CoV-2/FLU/RSV testing.  Fact Sheet for Patients: EntrepreneurPulse.com.au  Fact Sheet for Healthcare Providers: IncredibleEmployment.be  This test is not yet approved or cleared by the [place] FDA and has been authorized for detection and/or diagnosis of SARS-CoV-2 by FDA under an Emergency Use Authorization (EUA). This EUA will remain in effect (meaning this test can be used) for the duration of the COVID-19 declaration under Section 564(b)(1) of the Act, 21 U.S.C. section 360bbb-3(b)(1), unless the authorization is terminated or revoked.  Performed at [facility], [address], [place], [place] [address]   Urine Culture     Status: Abnormal   Collection Time: 06/15/21  7:36 PM   Specimen: Urine, Clean Catch  Result Value Ref Range Status   Specimen Description   Final    URINE, CLEAN CATCH Performed at [facility], [address], [place], [place] [address]    Special Requests   Final    NONE Performed at [facility], [address], [place], [place] [address]    Culture (A)  Final    <10,000 COLONIES/mL INSIGNIFICANT GROWTH Performed at [facility] [address], [place], [place] [address]    Report Status 06/17/2021 FINAL  Final  Blood Culture (routine x 2)     Status: None (Preliminary result)   Collection Time: 06/15/21  7:50 PM   Specimen: BLOOD RIGHT ARM  Result Value Ref Range Status   Specimen Description BLOOD RIGHT ARM  Final   Special Requests   Final    BOTTLES DRAWN AEROBIC AND ANAEROBIC Blood Culture adequate volume   Culture   Final    NO GROWTH 2 DAYS Performed at [facility], [address] [place], [place] [address]    Report Status PENDING  Incomplete  Blood Culture (routine x 2)     Status: None (Preliminary result)   Collection Time: 06/15/21  7:55 PM   Specimen: BLOOD RIGHT FOREARM  Result Value Ref Range Status   Specimen Description BLOOD RIGHT FOREARM  Final   Special Requests   Final    BOTTLES DRAWN AEROBIC AND ANAEROBIC Blood Culture adequate volume   Culture   Final    NO GROWTH 2 DAYS Performed at [facility], [address] [place], [place] [address]    Report Status PENDING  Incomplete  MRSA Next Gen by PCR, Nasal     Status: None   Collection Time: 06/16/21 12:04 AM   Specimen: Nasal Mucosa; Nasal Swab  Result Value Ref Range Status   MRSA by PCR Next Gen NOT DETECTED NOT DETECTED Final    Comment: (NOTE) The GeneXpert MRSA Assay (FDA approved for NASAL specimens only), is one component of a comprehensive MRSA colonization surveillance program. It is not intended to diagnose MRSA infection nor to guide or monitor treatment for MRSA infections. Test performance is not FDA approved in patients less than 56 years old. Performed at [facility], [address] [place], [place] [address]   Expectorated Sputum Assessment w Gram Stain, Rflx to Resp Cult     Status: None   Collection Time: 06/16/21 10:26 AM   Specimen: Sputum  Result  Value Ref  Range Status   Specimen Description SPUTUM  Final   Special Requests NONE  Final   Sputum evaluation   Final    THIS SPECIMEN IS ACCEPTABLE FOR SPUTUM CULTURE Performed at [facility], [address] [place], [place] [address]    Report Status 06/16/2021 FINAL  Final  Culture, Respiratory w Gram Stain     Status: None (Preliminary result)   Collection Time: 06/16/21 10:26 AM   Specimen: SPU  Result Value Ref Range Status   Specimen Description   Final    SPUTUM Performed at [facility], [address] [place], [place] [address]    Special Requests   Final    NONE Reflexed from [id number] Performed at [facility], [address], [place], [place] [address]    Gram Stain   Final    FEW SQUAMOUS EPITHELIAL CELLS PRESENT FEW WBC SEEN FEW GRAM POSITIVE COCCI    Culture   Final    TOO YOUNG TO READ Performed at [facility], [place] [address], [place], [place] [address]    Report Status PENDING  Incomplete     Scheduled Meds:  acetaminophen  650 mg Rectal Once   amitriptyline  25 mg Oral QHS   aspirin EC  81 mg Oral Daily   dextromethorphan-guaiFENesin  1 tablet Oral BID   donepezil  5 mg Oral QHS   enoxaparin (LOVENOX) injection  40 mg Subcutaneous QHS   feeding supplement  237 mL Oral BID BM   fludrocortisone  0.1 mg Oral Daily   gabapentin  900 mg Oral TID   levothyroxine  100 mcg Oral QAC breakfast   pyridostigmine  30 mg Oral TID   rosuvastatin  20 mg Oral Daily   ticagrelor  90 mg Oral BID   Continuous Infusions:  piperacillin-tazobactam (ZOSYN)  IV 3.375 g (06/17/21 0910)    Procedures/Studies: CT HEAD WO CONTRAST  Result Date: 06/12/2021 CLINICAL DATA:  Minor head trauma EXAM: CT HEAD WITHOUT CONTRAST TECHNIQUE: Contiguous axial images were obtained from the base of the skull through the vertex without intravenous contrast. Sagittal and coronal MPR images reconstructed from axial data set. COMPARISON:  06/08/2021 FINDINGS: Brain: Generalized atrophy. Normal  ventricular morphology. No midline shift or mass effect. Small vessel chronic ischemic changes of deep cerebral white matter. Small old LEFT occipital infarct. No intracranial hemorrhage, mass lesion, evidence of acute infarction, or extra-axial fluid collection. Vascular: Atherosclerotic calcification of internal carotid and vertebral arteries at skull base Skull: Intact Sinuses/Orbits: Clear Other: N/A IMPRESSION: Atrophy with small vessel chronic ischemic changes of deep cerebral white matter. Small old LEFT occipital infarct. No acute intracranial abnormalities. Electronically Signed   By: Lavonia Dana M.D.   On: 06/12/2021 14:41   CT HEAD WO CONTRAST  Result Date: 06/08/2021 CLINICAL DATA:  Left-sided weakness, disorientation. EXAM: CT HEAD WITHOUT CONTRAST TECHNIQUE: Contiguous axial images were obtained from the base of the skull through the vertex without intravenous contrast. COMPARISON:  CT head and MR brain dated 05/30/2021. FINDINGS: Brain: No evidence of acute infarction, hemorrhage, hydrocephalus, extra-axial collection or mass lesion/mass effect. There is mild cerebral volume loss with associated ex vacuo dilatation. 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 Vascular: There are vascular calcifications in the carotid siphons. Skull: Normal. Negative for fracture or focal lesion. Sinuses/Orbits: Mild right ethmoid sinus disease versus retention cyst. Other: None. IMPRESSION: No acute intracranial process. Electronically Signed   By: Zerita Boers M.D.   On: 06/08/2021  13:41   CT Head Wo Contrast  Result Date: 05/30/2021 CLINICAL DATA:  Mental status changes of unknown cause. EXAM: CT HEAD WITHOUT CONTRAST TECHNIQUE: Contiguous axial images were obtained from the base of the skull through the vertex without intravenous contrast. COMPARISON:  Comparison made with MRI of April 29, 2021 and CT of April 27, 2021. FINDINGS: Brain: No evidence of acute infarction, hemorrhage,  hydrocephalus, extra-axial collection or mass lesion/mass effect. Signs of atrophy and chronic microvascular ischemic change as before. Vascular: No hyperdense vessel or unexpected calcification. Skull: Normal. Negative for fracture or focal lesion. Sinuses/Orbits: Visualized paranasal sinuses and orbits are unremarkable. Other: None IMPRESSION: No acute intracranial pathology. Signs of atrophy and chronic microvascular ischemic change as before. Electronically Signed   By: Zetta Bills M.D.   On: 05/30/2021 11:48   MR BRAIN WO CONTRAST  Result Date: 06/12/2021 CLINICAL DATA:  Head trauma, minor (Age >= 65y) EXAM: MRI HEAD WITHOUT CONTRAST TECHNIQUE: Multiplanar, multiecho pulse sequences of the brain and surrounding structures were obtained without intravenous contrast. COMPARISON:  MRI 06/08/2021. FINDINGS: Brain: No acute infarction, hemorrhage, hydrocephalus, extra-axial collection or mass lesion. Similar atrophy with ex vacuo ventricular dilation. Similar scattered T2 hyperintensities in the white matter, compatible with chronic microvascular ischemic disease. Similar small remote lacunar infarcts in the left cerebellum and left occipital lobe. Vascular: Unchanged absence of the right ICA flow void with reconstitution of the MCA and ACA flow voids. Skull and upper cervical spine: Normal marrow signal. Sinuses/Orbits: Clear sinuses.  No acute orbital findings. Other: Chronic right mastoid effusion. IMPRESSION: 1. No evidence of acute intracranial abnormality. 2. Similar atrophy and chronic microvascular ischemic disease. 3. Similar absence of the right ICA flow void reconstitution of the ACA and MCA, compatible with right carotid occlusion. Electronically Signed   By: Margaretha Sheffield M.D.   On: 06/12/2021 17:40   MR BRAIN WO CONTRAST  Result Date: 06/08/2021 CLINICAL DATA:  Altered mental status, slurred speech, disoriented, left-sided weakness EXAM: MRI HEAD WITHOUT CONTRAST TECHNIQUE: Multiplanar,  multiecho pulse sequences of the brain and surrounding structures were obtained without intravenous contrast. COMPARISON:  Noncontrast CT head obtained earlier the same day, brain MRI 05/30/2021 FINDINGS: Brain: There is no evidence of acute intracranial hemorrhage, extra-axial fluid collection, or acute infarct. There is moderate parenchymal volume loss with commensurate enlargement of the ventricular system. Scattered foci of FLAIR signal abnormality throughout the subcortical and periventricular white matter likely reflects sequela of mild chronic white matter microangiopathy. A small remote lacunar infarct in the left cerebellar hemisphere and small remote infarct in the left occipital lobe are unchanged. There is a punctate chronic microhemorrhage in the left centrum semiovale, nonspecific. There is no suspicious parenchymal signal abnormality. There is no mass lesion. There is no midline shift. Vascular: The right ICA flow void is absent, unchanged. The right MCA and ACA flow voids are normal. Skull and upper cervical spine: Normal marrow signal. Sinuses/Orbits: The paranasal sinuses are clear. Bilateral lens implants are in place. The globes and orbits are otherwise unremarkable. Other: A right mastoid effusion is again seen. IMPRESSION: 1. No acute intracranial pathology. 2. Unchanged mild parenchymal volume loss, chronic white matter microangiopathy, and small remote infarcts as above. 3. Unchanged absence of the right ICA flow void with reconstitution of the ACA and MCA flow voids. Electronically Signed   By: Valetta Mole M.D.   On: 06/08/2021 16:29   MR Brain Wo Contrast (neuro protocol)  Result Date: 05/30/2021 CLINICAL DATA:  Altered mental status,  right-sided weakness EXAM: MRI HEAD WITHOUT CONTRAST TECHNIQUE: Multiplanar, multiecho pulse sequences of the brain and surrounding structures were obtained without intravenous contrast. COMPARISON:  Same day CT head FINDINGS: Brain: There is no evidence  of acute intracranial hemorrhage, extra-axial fluid collection, or acute infarct. There is a remote infarct in the left occipital lobe small foci of cortically based FLAIR signal abnormality most notable in the right frontal lobe are consistent with evolving infarcts as seen on the prior MRI from 04/29/2021. There is a small remote lacunar infarct in the left cerebellar hemisphere. There is mild parenchymal volume loss. Scattered additional of FLAIR signal abnormality in the subcortical and periventricular white matter likely reflect mild chronic white matter microangiopathy. There is no suspicious parenchymal signal abnormality. There is no mass lesion. There is no midline shift. Vascular: The right ICA flow void is absent. The ACA and MCA flow voids are present, presumably from collateral flow through the anterior communicating artery. This is in keeping with the findings on the CTA head from 04/27/2021 which demonstrated ICA occlusion. Skull and upper cervical spine: Normal marrow signal. Sinuses/Orbits: The paranasal sinuses are clear. Bilateral lens implants are in place. The globes and orbits are otherwise unremarkable. Other: There is a small right mastoid effusion, unchanged. IMPRESSION: 1. No acute intracranial pathology. 2. Unchanged remote infarct in the left occipital lobe and mild chronic white matter microangiopathy. 3. Absent right intracranial ICA flow void with reconstitution of the ACA and MCA, in keeping with the findings on the CTA head from 04/29/2021. Electronically Signed   By: Valetta Mole M.D.   On: 05/30/2021 13:58   DG Chest Port 1 View  Result Date: 06/15/2021 CLINICAL DATA:  Shortness of breath EXAM: PORTABLE CHEST 1 VIEW COMPARISON:  06/08/2021 FINDINGS: Post sternotomy changes. Borderline cardiomegaly with mild central congestion. Patchy airspace opacities at the bases. Aortic atherosclerosis. No pneumothorax. IMPRESSION: 1. Borderline cardiomegaly with vascular congestion. 2.  Patchy left greater than right basilar airspace disease, possible pneumonia Electronically Signed   By: Donavan Foil M.D.   On: 06/15/2021 19:58   DG Chest Portable 1 View  Result Date: 06/08/2021 CLINICAL DATA:  Confusion, incontinence EXAM: PORTABLE CHEST 1 VIEW COMPARISON:  05/30/2021 FINDINGS: Cardiomegaly status post median sternotomy. Both lungs are clear. The visualized skeletal structures are unremarkable. IMPRESSION: Cardiomegaly without acute abnormality of the lungs in AP portable projection. Electronically Signed   By: Eddie Candle M.D.   On: 06/08/2021 14:41   DG Chest Port 1 View  Result Date: 05/30/2021 CLINICAL DATA:  Altered mental status. EXAM: PORTABLE CHEST 1 VIEW COMPARISON:  Chest x-ray dated April 28, 2021. FINDINGS: Unchanged mild cardiomegaly. Normal pulmonary vascularity. Retrocardiac left lower lobe opacity. No pleural effusion or pneumothorax. No acute osseous abnormality. IMPRESSION: 1. Retrocardiac left lower lobe atelectasis versus infiltrate. Electronically Signed   By: Titus Dubin M.D.   On: 05/30/2021 11:37   DG Knee Complete 4 Views Left  Result Date: 06/12/2021 CLINICAL DATA:  Left knee weakness. EXAM: LEFT KNEE - COMPLETE 4+ VIEW COMPARISON:  None. FINDINGS: No fracture or dislocation. Left knee joint spaces appear preserved. There is minimal spurring the tibial spines. No evidence of chondrocalcinosis. No joint effusion. Regional soft tissues appear normal. IMPRESSION: No explanation for patient's left knee weakness. Electronically Signed   By: Sandi Mariscal M.D.   On: 06/12/2021 15:05   CT CHEST ABDOMEN PELVIS WO CONTRAST  Result Date: 05/30/2021 CLINICAL DATA:  Abdominal distension AKI new EXAM: CT CHEST, ABDOMEN AND PELVIS  WITHOUT CONTRAST TECHNIQUE: Multidetector CT imaging of the chest, abdomen and pelvis was performed following the standard protocol without IV contrast. COMPARISON:  None. FINDINGS: Chest: Cardiovascular: Ascending thoracic aorta measures  up to 4.0 x 3.8 cm. Diffuse atherosclerotic calcifications. Coronary artery calcifications. Normal heart size. No pericardial effusion. Mediastinum/Nodes: No enlarged mediastinal, hilar, or axillary lymph nodes. The thyroid gland appears normal. Lungs/Pleura: No pleural effusion. No pneumothorax. Ground-glass consolidation in the dependent bilateral lower lobes. No suspicious pulmonary nodules. Abdomen: Hepatobiliary: The liver is normal in size without focal abnormality. No intrahepatic or extrahepatic biliary ductal dilation. The gallbladder appears normal. Spleen: Normal in size without focal abnormality. Pancreas: No pancreatic ductal dilatation or surrounding inflammatory changes. Adrenals/Urinary Tract: Adrenal glands are unremarkable. Kidneys are normal, without renal calculi or hydronephrosis. Right simple renal cyst. Bladder is unremarkable. Stomach/Bowel: The stomach, small bowel and large bowel are normal in caliber without abnormal wall thickening or surrounding inflammatory changes. Stool ball in the rectal vault measures up to 8 cm. Reproductive: Prostate is unremarkable. Lymphatic: No enlarged lymph nodes in the abdomen or pelvis. Vasculature: The abdominal aorta is normal in caliber. Atherosclerotic calcifications, particularly of the femoral vessels. Other: No abdominopelvic ascites. Musculoskeletal: No aggressive osseous lesions. Multilevel degenerative changes of spine. Median sternotomy wires. The soft tissues are unremarkable. IMPRESSION: Chest: 1. Ground-glass consolidation in the dependent lower lobes, likely atelectasis. However, aspiration/pneumonia remains a differential consideration. 2. Ascending thoracic aorta measures up to 4.0 cm. Recommend annual imaging followup by CTA or MRA. This recommendation follows 2010 ACCF/AHA/AATS/ACR/ASA/SCA/SCAI/SIR/STS/SVM Guidelines for the Diagnosis and Management of Patients with Thoracic Aortic Disease. Circulation. 2010; 121: B867-J449. Aortic  aneurysm NOS (ICD10-I71.9) 3. Coronary artery calcifications. Abdomen/pelvis: 1. Stool ball in the rectum measures up to 8 cm. No other acute findings in the abdomen or pelvis. Electronically Signed   By: Albin Felling M.D.   On: 05/30/2021 14:47    Orson Eva, DO  [facility]  If 7PM-7AM, please contact night-coverage [email or web address] Password [email or web address] 06/17/2021, 2:52 PM   LOS: 1 day
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
## 2021-06-17 NOTE — Progress Notes (Signed)
EEG completed, results pending. 

## 2021-06-18 DIAGNOSIS — J9601 Acute respiratory failure with hypoxia: Secondary | ICD-10-CM | POA: Diagnosis not present

## 2021-06-18 DIAGNOSIS — D472 Monoclonal gammopathy: Secondary | ICD-10-CM | POA: Diagnosis not present

## 2021-06-18 DIAGNOSIS — J181 Lobar pneumonia, unspecified organism: Secondary | ICD-10-CM | POA: Diagnosis not present

## 2021-06-18 DIAGNOSIS — A419 Sepsis, unspecified organism: Secondary | ICD-10-CM | POA: Diagnosis not present

## 2021-06-18 LAB — CBC
HCT: 38.4 % — ABNORMAL LOW (ref 39.0–52.0)
Hemoglobin: 12.5 g/dL — ABNORMAL LOW (ref 13.0–17.0)
MCH: 30.8 pg (ref 26.0–34.0)
MCHC: 32.6 g/dL (ref 30.0–36.0)
MCV: 94.6 fL (ref 80.0–100.0)
Platelets: 189 10*3/uL (ref 150–400)
RBC: 4.06 MIL/uL — ABNORMAL LOW (ref 4.22–5.81)
RDW: 15 % (ref 11.5–15.5)
WBC: 12.8 10*3/uL — ABNORMAL HIGH (ref 4.0–10.5)
nRBC: 0 % (ref 0.0–0.2)

## 2021-06-18 LAB — CULTURE, RESPIRATORY W GRAM STAIN: Culture: NORMAL

## 2021-06-18 LAB — BASIC METABOLIC PANEL
Anion gap: 11 (ref 5–15)
BUN: 14 mg/dL (ref 8–23)
CO2: 28 mmol/L (ref 22–32)
Calcium: 9.3 mg/dL (ref 8.9–10.3)
Chloride: 99 mmol/L (ref 98–111)
Creatinine, Ser: 1.19 mg/dL (ref 0.61–1.24)
GFR, Estimated: >60 mL/min (ref >60)
Glucose, Bld: 129 mg/dL — ABNORMAL HIGH (ref 70–99)
Potassium: 3.3 mmol/L — ABNORMAL LOW (ref 3.5–5.1)
Sodium: 138 mmol/L (ref 135–145)

## 2021-06-18 LAB — LEGIONELLA PNEUMOPHILA SEROGP 1 UR AG: L. pneumophila Serogp 1 Ur Ag: NEGATIVE

## 2021-06-18 LAB — MAGNESIUM: Magnesium: 1.9 mg/dL (ref 1.7–2.4)

## 2021-06-18 MED ORDER — AMOXICILLIN-POT CLAVULANATE 875-125 MG PO TABS: 1.0000 | ORAL_TABLET | Freq: Two times a day (BID) | ORAL | Status: DC

## 2021-06-18 MED ORDER — ENSURE ENLIVE PO LIQD: 237.0000 mL | Freq: Two times a day (BID) | ORAL | 12 refills | Status: DC

## 2021-06-18 MED ORDER — AMOXICILLIN-POT CLAVULANATE 875-125 MG PO TABS: 1.0000 | ORAL_TABLET | Freq: Two times a day (BID) | ORAL | 0 refills | Status: DC

## 2021-06-18 NOTE — Progress Notes (Signed)
Discharge instructions reviewed with patient. Patient verbalized understanding of discharge instructions. Spoke with wife, [name], who states hospital bed has not been delivered yet and that she was told it will be delivered today. Informed both patient and wife that patient is currently receiving an IV antibiotic that will finish around 1340. Wife is to call this RN once hospital bed has been delivered.

## 2021-06-18 NOTE — TOC Transition Note (Signed)
Transition of Care [facility]) - CM/SW Discharge Note   Patient Details  Name: George Bradley MRN: [id number] Date of Birth: September 11, 1941  [facility] CM/SW Contact:  Boneta Lucks, RN Phone Number: 06/18/2021, 1:02 PM   Clinical Narrative:   Patient discharging home today,active with [name], MD ordered PT/RN. Georgina Snell updated.   Final next level of care: [place] Barriers to Discharge: Barriers Resolved  Patient Goals and CMS Choice Patient states their goals for this hospitalization and ongoing recovery are:: To return home. CMS Medicare.gov Compare Post Acute Care list provided to:: Patient Represenative (must comment) Choice offered to / list presented to : Spouse  Discharge Placement            Patient chooses bed at:  [facility]) Patient to be transferred to facility by: Family   Patient and family notified of of transfer: 06/18/21  Discharge Plan and Services    Social Determinants of Health (SDOH) Interventions   Readmission Risk Interventions Readmission Risk Prevention Plan 06/17/2021  Transportation Screening Complete  HRI or [place] Complete  Social Work Consult for [place] Planning/Counseling Complete  Palliative Care Screening Complete  Medication Review Press photographer) Complete  Some recent data might be hidden

## 2021-06-18 NOTE — Discharge Summary (Signed)
Physician Discharge Summary  George Bradley:[id number] DOB: 04-16-1941 DOA: 06/15/2021  PCP: [name], MD  Admit date: 06/15/2021 Discharge date: 06/18/2021  Admitted From: Home Disposition:  Home   Recommendations for Outpatient Follow-up:  Follow up with PCP in 1-2 weeks Please obtain BMP/CBC in one week   Home Health: [facility]   Discharge Condition: Stable CODE STATUS: DNR Diet recommendation: Heart Healthy    Brief/Interim Summary: 80 year old male with a history of stroke, right carotid stenosis, coronary disease, CKD stage III, hyperlipidemia, oropharyngeal cancer/tongue cancer, hypothyroidism, cognitive impairment, MGUS, orthostatic hypotension, insomnia presenting with recurrent episodes of "shaking all over" accompanied by generalized weakness and confusion.  The patient is a difficult historian secondary to his cognitive impairment.  History is obtained from speaking with the patient's spouse.  Review of the medical record shows that the patient has had these episodes for a few weeks according to the patient's spouse.  She states that the episodes last about 20 to 30 minutes.  She states that " he is like dead weight" during these episodes; however, the patient is awake and speaks with the patient spouse albeit confused during these episodes.  She states that they checked his vitals during these episodes and he has a "normal" blood pressure.  Normally, the patient has been on a regimen of permissive hypertension secondary to his recurrent orthostatic hypotension.  He has not had any fevers, chills, chest pain, shortness breath, cough, hemoptysis.  However, when EMS was activated they noted the patient to have hypoxia and placed him on supplemental oxygen.  In addition, the spouse states that the patient had an episode of emesis yesterday without any blood.  He has not been having any diarrhea, abdominal pain, dysuria, hematuria.  He has been started back on his pyridostigmine  after a period of time discontinuation secondary to concerns for interaction with his Florinef.  He has not been started any other medications.  He continues on amitriptyline for his chronic insomnia.  As noted in the past, the patient has chronic issues with insomnia for which she has taken intermittently Ambien, doxepin, and Seroquel in the past. The patient has had numerous hospitalizations and ED visits for a variety of issues.  Most recently, the patient had an ED visit on 06/12/2021.  Because of the above complaints and concerns for left-sided neglect.  MRI of the brain on that day was negative for any acute findings.  It was felt that the patient likely had a watershed type effect secondary to his orthostatic hypotension resulting in his focal deficits.  The patient was instructed to take his Florinef 0.5 mg twice daily rather than 1 mg once daily.   He was also recently hospitalized from 05/30/2021 to 05/31/2021 secondary to altered mental status from a medication effect when he took a combination of doxepin and amitriptyline together.He improved after discontinuation of doxepin   Prior to that, he was hospitalized  from 04/27/2021 to 04/30/2021 when he presented with left upper extremity weakness and slurred speech.  MRI showed multiple punctate acute infarctions in the right cortical brain.  He noted to have right internal carotid occlusion.  He was followed by stroke neurology as well as IR.  After discussion with IR, the patient and spouse opted for conservative therapy and expectant management.  They were told to continue aspirin and Brilinta and to have repeat CTA of the neck in 1 to 2 months.  The patient initially had a right internal carotid artery stent placed  in March 2022.  In April 2022, the patient had a CTA of the head and neck which showed 50% stenosis of the right ICA stent.  He followed up with IR, Dr. Norma Fredrickson in July 2022 and carotid Doppler was unremarkable    Discharge Diagnoses:    Sepsis -Present on admission -Secondary to pneumonia -Lactic acid 1.9 -Patient presented with fever, tachycardia, and altered mental status -sepsis physiology resolved   Lobar pneumonia -Switch to Zosyn due to concerns for aspiration component -Check PCT 7.66>>6.65 -speech therapy eval>>regular with thin -d/c home with amox/clav x 5 days   Acute respiratory failure with hypoxia -Currently stable on 3 L -Wean oxygen as tolerated back to room air   Acute metabolic encephalopathy -Mental status seems to be improved on the morning of 06/16/2021 -Multifactorial including aspiration pneumonitis, hypnotic medications -9/26--mental status at baseline -Obtain EEG   CKD stage IIIa -Baseline creatinine 1.0-1.2 -Routine BMP   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 Heath Lark   Dementia without behavioral disturbance -continue aricept   Peripheral neuropathy -Continue gabapentin   Hypothyroidism -continue synthroid   RBBB -This is chronic when his previous EKGs were reviewed   Hypokalemia -replete -mag 1.9  Discharge Instructions   Allergies as of 06/18/2021       Reactions   Doxepin Other (See Comments)   Left-sided weakness appeared the morning after this was first taken   Zetia [ezetimibe] Other (See Comments)   "made me feel bad"        Medication List     STOP taking these medications    amLODipine 2.5 MG tablet Commonly known as: NORVASC   amLODipine 5 MG tablet Commonly known as: NORVASC   zolpidem 10 MG tablet Commonly known as: AMBIEN       TAKE these medications    amitriptyline 25 MG tablet Commonly known as: ELAVIL Take 25 mg by mouth at bedtime. What changed: Another medication with the same name was  removed. Continue taking this medication, and follow the directions you see here.   amoxicillin-clavulanate 875-125 MG tablet Commonly known as: AUGMENTIN Take 1 tablet by mouth every 12 (twelve) hours. What changed: when to take this   aspirin 81 MG EC tablet Take 1 tablet (81 mg total) by mouth daily. Swallow whole. What changed: when to take this   Brilinta 90 MG Tabs tablet Generic drug: ticagrelor Take 90 mg by mouth in the morning and at bedtime.   cetirizine 10 MG tablet Commonly known as: ZYRTEC Take 10 mg by mouth daily.   donepezil 5 MG tablet Commonly known as: ARICEPT Take 5 mg by mouth at bedtime.   feeding supplement Liqd Take 237 mLs by mouth 2 (two) times daily between meals.   fludrocortisone 0.1 MG tablet Commonly known as: FLORINEF Take 0.1 mg by mouth in the morning.   gabapentin 300 MG capsule Commonly known as: NEURONTIN TAKE 3 CAPSULES(900 MG) BY MOUTH THREE TIMES DAILY What changed: See the new instructions.   indomethacin 25 MG capsule Commonly known as: INDOCIN Take 25 mg by mouth in the morning and at bedtime.   levothyroxine 100 MCG tablet Commonly known as: SYNTHROID TAKE ONE TABLET BY MOUTH ONCE DAILY BEFORE  BREAKFAST What changed: See the new instructions.   LORazepam 1 MG tablet Commonly  known as: ATIVAN Take 1 tablet (1 mg total) by mouth every 8 (eight) hours as needed for anxiety. What changed: when to take this   pilocarpine 7.5 MG tablet Commonly known as: SALAGEN Take 7.5 mg by mouth 3 (three) times daily. What changed: Another medication with the same name was removed. Continue taking this medication, and follow the directions you see here.   pyridostigmine 60 MG tablet Commonly known as: Mestinon Take 0.5 tablets (30 mg total) by mouth 3 (three) times daily. What changed:  how much to take Another medication with the same name was removed. Continue taking this medication, and follow the directions you see here.    Repatha SureClick 921 MG/ML Soaj Generic drug: Evolocumab Inject 140 mg into the skin every 14 (fourteen) days.   rosuvastatin 20 MG tablet Commonly known as: CRESTOR Take 1 tablet (20 mg total) by mouth daily. What changed: when to take this   traMADol 50 MG tablet Commonly known as: ULTRAM Take 1 tablet (50 mg total) by mouth 2 (two) times daily as needed for moderate pain. What changed: when to take this        Allergies  Allergen Reactions   Doxepin Other (See Comments)    Left-sided weakness appeared the morning after this was first taken   Zetia [Ezetimibe] Other (See Comments)    "made me feel bad"    Consultations: none   Procedures/Studies: CT HEAD WO CONTRAST  Result Date: 06/12/2021 CLINICAL DATA:  Minor head trauma EXAM: CT HEAD WITHOUT CONTRAST TECHNIQUE: Contiguous axial images were obtained from the base of the skull through the vertex without intravenous contrast. Sagittal and coronal MPR images reconstructed from axial data set. COMPARISON:  06/08/2021 FINDINGS: Brain: Generalized atrophy. Normal ventricular morphology. No midline shift or mass effect. Small vessel chronic ischemic changes of deep cerebral white matter. Small old LEFT occipital infarct. No intracranial hemorrhage, mass lesion, evidence of acute infarction, or extra-axial fluid collection. Vascular: Atherosclerotic calcification of internal carotid and vertebral arteries at skull base Skull: Intact Sinuses/Orbits: Clear Other: N/A IMPRESSION: Atrophy with small vessel chronic ischemic changes of deep cerebral white matter. Small old LEFT occipital infarct. No acute intracranial abnormalities. Electronically Signed   By: Lavonia Dana M.D.   On: 06/12/2021 14:41   CT HEAD WO CONTRAST  Result Date: 06/08/2021 CLINICAL DATA:  Left-sided weakness, disorientation. EXAM: CT HEAD WITHOUT CONTRAST TECHNIQUE: Contiguous axial images were obtained from the base of the skull through the vertex without  intravenous contrast. COMPARISON:  CT head and MR brain dated 05/30/2021. FINDINGS: Brain: No evidence of acute infarction, hemorrhage, hydrocephalus, extra-axial collection or mass lesion/mass effect. There is mild cerebral volume loss with associated ex vacuo dilatation. 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 Vascular: There are vascular calcifications in the carotid siphons. Skull: Normal. Negative for fracture or focal lesion. Sinuses/Orbits: Mild right ethmoid sinus disease versus retention cyst. Other: None. IMPRESSION: No acute intracranial process. Electronically Signed   By: Zerita Boers M.D.   On: 06/08/2021 13:41   CT Head Wo Contrast  Result Date: 05/30/2021 CLINICAL DATA:  Mental status changes of unknown cause. EXAM: CT HEAD WITHOUT CONTRAST TECHNIQUE: Contiguous axial images were obtained from the base of the skull through the vertex without intravenous contrast. COMPARISON:  Comparison made with MRI of April 29, 2021 and CT of April 27, 2021. FINDINGS: Brain: No evidence of acute infarction, hemorrhage, hydrocephalus, extra-axial collection or mass lesion/mass effect. Signs of atrophy and chronic microvascular ischemic  change as before. Vascular: No hyperdense vessel or unexpected calcification. Skull: Normal. Negative for fracture or focal lesion. Sinuses/Orbits: Visualized paranasal sinuses and orbits are unremarkable. Other: None IMPRESSION: No acute intracranial pathology. Signs of atrophy and chronic microvascular ischemic change as before. Electronically Signed   By: Zetta Bills M.D.   On: 05/30/2021 11:48   MR BRAIN WO CONTRAST  Result Date: 06/12/2021 CLINICAL DATA:  Head trauma, minor (Age >= 65y) EXAM: MRI HEAD WITHOUT CONTRAST TECHNIQUE: Multiplanar, multiecho pulse sequences of the brain and surrounding structures were obtained without intravenous contrast. COMPARISON:  MRI 06/08/2021. FINDINGS: Brain: No acute infarction, hemorrhage, hydrocephalus,  extra-axial collection or mass lesion. Similar atrophy with ex vacuo ventricular dilation. Similar scattered T2 hyperintensities in the white matter, compatible with chronic microvascular ischemic disease. Similar small remote lacunar infarcts in the left cerebellum and left occipital lobe. Vascular: Unchanged absence of the right ICA flow void with reconstitution of the MCA and ACA flow voids. Skull and upper cervical spine: Normal marrow signal. Sinuses/Orbits: Clear sinuses.  No acute orbital findings. Other: Chronic right mastoid effusion. IMPRESSION: 1. No evidence of acute intracranial abnormality. 2. Similar atrophy and chronic microvascular ischemic disease. 3. Similar absence of the right ICA flow void reconstitution of the ACA and MCA, compatible with right carotid occlusion. Electronically Signed   By: Margaretha Sheffield M.D.   On: 06/12/2021 17:40   MR BRAIN WO CONTRAST  Result Date: 06/08/2021 CLINICAL DATA:  Altered mental status, slurred speech, disoriented, left-sided weakness EXAM: MRI HEAD WITHOUT CONTRAST TECHNIQUE: Multiplanar, multiecho pulse sequences of the brain and surrounding structures were obtained without intravenous contrast. COMPARISON:  Noncontrast CT head obtained earlier the same day, brain MRI 05/30/2021 FINDINGS: Brain: There is no evidence of acute intracranial hemorrhage, extra-axial fluid collection, or acute infarct. There is moderate parenchymal volume loss with commensurate enlargement of the ventricular system. Scattered foci of FLAIR signal abnormality throughout the subcortical and periventricular white matter likely reflects sequela of mild chronic white matter microangiopathy. A small remote lacunar infarct in the left cerebellar hemisphere and small remote infarct in the left occipital lobe are unchanged. There is a punctate chronic microhemorrhage in the left centrum semiovale, nonspecific. There is no suspicious parenchymal signal abnormality. There is no mass  lesion. There is no midline shift. Vascular: The right ICA flow void is absent, unchanged. The right MCA and ACA flow voids are normal. Skull and upper cervical spine: Normal marrow signal. Sinuses/Orbits: The paranasal sinuses are clear. Bilateral lens implants are in place. The globes and orbits are otherwise unremarkable. Other: A right mastoid effusion is again seen. IMPRESSION: 1. No acute intracranial pathology. 2. Unchanged mild parenchymal volume loss, chronic white matter microangiopathy, and small remote infarcts as above. 3. Unchanged absence of the right ICA flow void with reconstitution of the ACA and MCA flow voids. Electronically Signed   By: Valetta Mole M.D.   On: 06/08/2021 16:29   MR Brain Wo Contrast (neuro protocol)  Result Date: 05/30/2021 CLINICAL DATA:  Altered mental status, right-sided weakness EXAM: MRI HEAD WITHOUT CONTRAST TECHNIQUE: Multiplanar, multiecho pulse sequences of the brain and surrounding structures were obtained without intravenous contrast. COMPARISON:  Same day CT head FINDINGS: Brain: There is no evidence of acute intracranial hemorrhage, extra-axial fluid collection, or acute infarct. There is a remote infarct in the left occipital lobe small foci of cortically based FLAIR signal abnormality most notable in the right frontal lobe are consistent with evolving infarcts as seen on the prior MRI  from 04/29/2021. There is a small remote lacunar infarct in the left cerebellar hemisphere. There is mild parenchymal volume loss. Scattered additional of FLAIR signal abnormality in the subcortical and periventricular white matter likely reflect mild chronic white matter microangiopathy. There is no suspicious parenchymal signal abnormality. There is no mass lesion. There is no midline shift. Vascular: The right ICA flow void is absent. The ACA and MCA flow voids are present, presumably from collateral flow through the anterior communicating artery. This is in keeping with the  findings on the CTA head from 04/27/2021 which demonstrated ICA occlusion. Skull and upper cervical spine: Normal marrow signal. Sinuses/Orbits: The paranasal sinuses are clear. Bilateral lens implants are in place. The globes and orbits are otherwise unremarkable. Other: There is a small right mastoid effusion, unchanged. IMPRESSION: 1. No acute intracranial pathology. 2. Unchanged remote infarct in the left occipital lobe and mild chronic white matter microangiopathy. 3. Absent right intracranial ICA flow void with reconstitution of the ACA and MCA, in keeping with the findings on the CTA head from 04/29/2021. Electronically Signed   By: Valetta Mole M.D.   On: 05/30/2021 13:58   DG Chest Port 1 View  Result Date: 06/15/2021 CLINICAL DATA:  Shortness of breath EXAM: PORTABLE CHEST 1 VIEW COMPARISON:  06/08/2021 FINDINGS: Post sternotomy changes. Borderline cardiomegaly with mild central congestion. Patchy airspace opacities at the bases. Aortic atherosclerosis. No pneumothorax. IMPRESSION: 1. Borderline cardiomegaly with vascular congestion. 2. Patchy left greater than right basilar airspace disease, possible pneumonia Electronically Signed   By: Donavan Foil M.D.   On: 06/15/2021 19:58   DG Chest Portable 1 View  Result Date: 06/08/2021 CLINICAL DATA:  Confusion, incontinence EXAM: PORTABLE CHEST 1 VIEW COMPARISON:  05/30/2021 FINDINGS: Cardiomegaly status post median sternotomy. Both lungs are clear. The visualized skeletal structures are unremarkable. IMPRESSION: Cardiomegaly without acute abnormality of the lungs in AP portable projection. Electronically Signed   By: Eddie Candle M.D.   On: 06/08/2021 14:41   DG Chest Port 1 View  Result Date: 05/30/2021 CLINICAL DATA:  Altered mental status. EXAM: PORTABLE CHEST 1 VIEW COMPARISON:  Chest x-ray dated April 28, 2021. FINDINGS: Unchanged mild cardiomegaly. Normal pulmonary vascularity. Retrocardiac left lower lobe opacity. No pleural effusion or  pneumothorax. No acute osseous abnormality. IMPRESSION: 1. Retrocardiac left lower lobe atelectasis versus infiltrate. Electronically Signed   By: Titus Dubin M.D.   On: 05/30/2021 11:37   DG Knee Complete 4 Views Left  Result Date: 06/12/2021 CLINICAL DATA:  Left knee weakness. EXAM: LEFT KNEE - COMPLETE 4+ VIEW COMPARISON:  None. FINDINGS: No fracture or dislocation. Left knee joint spaces appear preserved. There is minimal spurring the tibial spines. No evidence of chondrocalcinosis. No joint effusion. Regional soft tissues appear normal. IMPRESSION: No explanation for patient's left knee weakness. Electronically Signed   By: Sandi Mariscal M.D.   On: 06/12/2021 15:05   EEG adult  Result Date: 06/17/2021 Lora Havens, MD     06/18/2021  8:27 AM Patient Name: George Bradley MRN: [id number] Epilepsy Attending: Lora Havens Referring Physician/Provider: Dr Shanon Brow Shawnta Schlegel Date: 06/17/2021 Duration: 22.50 mins Patient history: 80yo M with ams. EEG to evaluate for seizure Level of alertness: Awake, asleep AEDs during EEG study: Gabapentin Technical aspects: This EEG study was done with scalp electrodes positioned according to the 10-20 International system of electrode placement. Electrical activity was acquired at a sampling rate of 500Hz  and reviewed with a high frequency filter of 70Hz  and a low frequency  filter of 1Hz . EEG data were recorded continuously and digitally stored. Description: The posterior dominant rhythm consists of 7.5Hz  activity of moderate voltage (25-35 uV) seen predominantly in posterior head regions, symmetric and reactive to eye opening and eye closing. Sleep was characterized by vertex waves, sleep spindles (12 to 14 Hz), maximal frontocentral region. EEG showed intermittent generalized 3 to 6 Hz theta-delta slowing. Hyperventilation and photic stimulation were not performed.   ABNORMALITY - Intermittent slow, generalized IMPRESSION: This study is suggestive of mild diffuse  encephalopathy, nonspecific etiology. No seizures or epileptiform discharges were seen throughout the recording. Priyanka Barbra Sarks   CT CHEST ABDOMEN PELVIS WO CONTRAST  Result Date: 05/30/2021 CLINICAL DATA:  Abdominal distension AKI new EXAM: CT CHEST, ABDOMEN AND PELVIS WITHOUT CONTRAST TECHNIQUE: Multidetector CT imaging of the chest, abdomen and pelvis was performed following the standard protocol without IV contrast. COMPARISON:  None. FINDINGS: Chest: Cardiovascular: Ascending thoracic aorta measures up to 4.0 x 3.8 cm. Diffuse atherosclerotic calcifications. Coronary artery calcifications. Normal heart size. No pericardial effusion. Mediastinum/Nodes: No enlarged mediastinal, hilar, or axillary lymph nodes. The thyroid gland appears normal. Lungs/Pleura: No pleural effusion. No pneumothorax. Ground-glass consolidation in the dependent bilateral lower lobes. No suspicious pulmonary nodules. Abdomen: Hepatobiliary: The liver is normal in size without focal abnormality. No intrahepatic or extrahepatic biliary ductal dilation. The gallbladder appears normal. Spleen: Normal in size without focal abnormality. Pancreas: No pancreatic ductal dilatation or surrounding inflammatory changes. Adrenals/Urinary Tract: Adrenal glands are unremarkable. Kidneys are normal, without renal calculi or hydronephrosis. Right simple renal cyst. Bladder is unremarkable. Stomach/Bowel: The stomach, small bowel and large bowel are normal in caliber without abnormal wall thickening or surrounding inflammatory changes. Stool ball in the rectal vault measures up to 8 cm. Reproductive: Prostate is unremarkable. Lymphatic: No enlarged lymph nodes in the abdomen or pelvis. Vasculature: The abdominal aorta is normal in caliber. Atherosclerotic calcifications, particularly of the femoral vessels. Other: No abdominopelvic ascites. Musculoskeletal: No aggressive osseous lesions. Multilevel degenerative changes of spine. Median sternotomy  wires. The soft tissues are unremarkable. IMPRESSION: Chest: 1. Ground-glass consolidation in the dependent lower lobes, likely atelectasis. However, aspiration/pneumonia remains a differential consideration. 2. Ascending thoracic aorta measures up to 4.0 cm. Recommend annual imaging followup by CTA or MRA. This recommendation follows 2010 ACCF/AHA/AATS/ACR/ASA/SCA/SCAI/SIR/STS/SVM Guidelines for the Diagnosis and Management of Patients with Thoracic Aortic Disease. Circulation. 2010; 121: V893-Y101. Aortic aneurysm NOS (ICD10-I71.9) 3. Coronary artery calcifications. Abdomen/pelvis: 1. Stool ball in the rectum measures up to 8 cm. No other acute findings in the abdomen or pelvis. Electronically Signed   By: Albin Felling M.D.   On: 05/30/2021 14:47        Discharge Exam: Vitals:   06/17/21 2008 06/18/21 0420  BP: (!) 153/86 (!) 177/99  Pulse: 80 80  Resp: 16 17  Temp: 98.6 F (37 C) 98.1 F (36.7 C)  SpO2: 94% 92%   Vitals:   06/17/21 0746 06/17/21 1500 06/17/21 2008 06/18/21 0420  BP: (!) 169/89 (!) 167/98 (!) 153/86 (!) 177/99  Pulse: 75 73 80 80  Resp: 12 16 16 17   Temp: 98.5 F (36.9 C) 98.3 F (36.8 C) 98.6 F (37 C) 98.1 F (36.7 C)  TempSrc: Oral Oral Oral Oral  SpO2: 96% 93% 94% 92%  Weight:      Height:        General: Pt is alert, awake, not in acute distress Cardiovascular: RRR, S1/S2 +, no rubs, no gallops Respiratory: biabasilar rales. No wheeze Abdominal: Soft, NT,  ND, bowel sounds + Extremities: no edema, no cyanosis   The results of significant diagnostics from this hospitalization (including imaging, microbiology, ancillary and laboratory) are listed below for reference.    Significant Diagnostic Studies: CT HEAD WO CONTRAST  Result Date: 06/12/2021 CLINICAL DATA:  Minor head trauma EXAM: CT HEAD WITHOUT CONTRAST TECHNIQUE: Contiguous axial images were obtained from the base of the skull through the vertex without intravenous contrast. Sagittal and  coronal MPR images reconstructed from axial data set. COMPARISON:  06/08/2021 FINDINGS: Brain: Generalized atrophy. Normal ventricular morphology. No midline shift or mass effect. Small vessel chronic ischemic changes of deep cerebral white matter. Small old LEFT occipital infarct. No intracranial hemorrhage, mass lesion, evidence of acute infarction, or extra-axial fluid collection. Vascular: Atherosclerotic calcification of internal carotid and vertebral arteries at skull base Skull: Intact Sinuses/Orbits: Clear Other: N/A IMPRESSION: Atrophy with small vessel chronic ischemic changes of deep cerebral white matter. Small old LEFT occipital infarct. No acute intracranial abnormalities. Electronically Signed   By: Lavonia Dana M.D.   On: 06/12/2021 14:41   CT HEAD WO CONTRAST  Result Date: 06/08/2021 CLINICAL DATA:  Left-sided weakness, disorientation. EXAM: CT HEAD WITHOUT CONTRAST TECHNIQUE: Contiguous axial images were obtained from the base of the skull through the vertex without intravenous contrast. COMPARISON:  CT head and MR brain dated 05/30/2021. FINDINGS: Brain: No evidence of acute infarction, hemorrhage, hydrocephalus, extra-axial collection or mass lesion/mass effect. There is mild cerebral volume loss with associated ex vacuo dilatation. 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 Vascular: There are vascular calcifications in the carotid siphons. Skull: Normal. Negative for fracture or focal lesion. Sinuses/Orbits: Mild right ethmoid sinus disease versus retention cyst. Other: None. IMPRESSION: No acute intracranial process. Electronically Signed   By: Zerita Boers M.D.   On: 06/08/2021 13:41   CT Head Wo Contrast  Result Date: 05/30/2021 CLINICAL DATA:  Mental status changes of unknown cause. EXAM: CT HEAD WITHOUT CONTRAST TECHNIQUE: Contiguous axial images were obtained from the base of the skull through the vertex without intravenous contrast. COMPARISON:  Comparison made  with MRI of April 29, 2021 and CT of April 27, 2021. FINDINGS: Brain: No evidence of acute infarction, hemorrhage, hydrocephalus, extra-axial collection or mass lesion/mass effect. Signs of atrophy and chronic microvascular ischemic change as before. Vascular: No hyperdense vessel or unexpected calcification. Skull: Normal. Negative for fracture or focal lesion. Sinuses/Orbits: Visualized paranasal sinuses and orbits are unremarkable. Other: None IMPRESSION: No acute intracranial pathology. Signs of atrophy and chronic microvascular ischemic change as before. Electronically Signed   By: Zetta Bills M.D.   On: 05/30/2021 11:48   MR BRAIN WO CONTRAST  Result Date: 06/12/2021 CLINICAL DATA:  Head trauma, minor (Age >= 65y) EXAM: MRI HEAD WITHOUT CONTRAST TECHNIQUE: Multiplanar, multiecho pulse sequences of the brain and surrounding structures were obtained without intravenous contrast. COMPARISON:  MRI 06/08/2021. FINDINGS: Brain: No acute infarction, hemorrhage, hydrocephalus, extra-axial collection or mass lesion. Similar atrophy with ex vacuo ventricular dilation. Similar scattered T2 hyperintensities in the white matter, compatible with chronic microvascular ischemic disease. Similar small remote lacunar infarcts in the left cerebellum and left occipital lobe. Vascular: Unchanged absence of the right ICA flow void with reconstitution of the MCA and ACA flow voids. Skull and upper cervical spine: Normal marrow signal. Sinuses/Orbits: Clear sinuses.  No acute orbital findings. Other: Chronic right mastoid effusion. IMPRESSION: 1. No evidence of acute intracranial abnormality. 2. Similar atrophy and chronic microvascular ischemic disease. 3. Similar absence of the  right ICA flow void reconstitution of the ACA and MCA, compatible with right carotid occlusion. Electronically Signed   By: Margaretha Sheffield M.D.   On: 06/12/2021 17:40   MR BRAIN WO CONTRAST  Result Date: 06/08/2021 CLINICAL DATA:  Altered  mental status, slurred speech, disoriented, left-sided weakness EXAM: MRI HEAD WITHOUT CONTRAST TECHNIQUE: Multiplanar, multiecho pulse sequences of the brain and surrounding structures were obtained without intravenous contrast. COMPARISON:  Noncontrast CT head obtained earlier the same day, brain MRI 05/30/2021 FINDINGS: Brain: There is no evidence of acute intracranial hemorrhage, extra-axial fluid collection, or acute infarct. There is moderate parenchymal volume loss with commensurate enlargement of the ventricular system. Scattered foci of FLAIR signal abnormality throughout the subcortical and periventricular white matter likely reflects sequela of mild chronic white matter microangiopathy. A small remote lacunar infarct in the left cerebellar hemisphere and small remote infarct in the left occipital lobe are unchanged. There is a punctate chronic microhemorrhage in the left centrum semiovale, nonspecific. There is no suspicious parenchymal signal abnormality. There is no mass lesion. There is no midline shift. Vascular: The right ICA flow void is absent, unchanged. The right MCA and ACA flow voids are normal. Skull and upper cervical spine: Normal marrow signal. Sinuses/Orbits: The paranasal sinuses are clear. Bilateral lens implants are in place. The globes and orbits are otherwise unremarkable. Other: A right mastoid effusion is again seen. IMPRESSION: 1. No acute intracranial pathology. 2. Unchanged mild parenchymal volume loss, chronic white matter microangiopathy, and small remote infarcts as above. 3. Unchanged absence of the right ICA flow void with reconstitution of the ACA and MCA flow voids. Electronically Signed   By: Valetta Mole M.D.   On: 06/08/2021 16:29   MR Brain Wo Contrast (neuro protocol)  Result Date: 05/30/2021 CLINICAL DATA:  Altered mental status, right-sided weakness EXAM: MRI HEAD WITHOUT CONTRAST TECHNIQUE: Multiplanar, multiecho pulse sequences of the brain and surrounding  structures were obtained without intravenous contrast. COMPARISON:  Same day CT head FINDINGS: Brain: There is no evidence of acute intracranial hemorrhage, extra-axial fluid collection, or acute infarct. There is a remote infarct in the left occipital lobe small foci of cortically based FLAIR signal abnormality most notable in the right frontal lobe are consistent with evolving infarcts as seen on the prior MRI from 04/29/2021. There is a small remote lacunar infarct in the left cerebellar hemisphere. There is mild parenchymal volume loss. Scattered additional of FLAIR signal abnormality in the subcortical and periventricular white matter likely reflect mild chronic white matter microangiopathy. There is no suspicious parenchymal signal abnormality. There is no mass lesion. There is no midline shift. Vascular: The right ICA flow void is absent. The ACA and MCA flow voids are present, presumably from collateral flow through the anterior communicating artery. This is in keeping with the findings on the CTA head from 04/27/2021 which demonstrated ICA occlusion. Skull and upper cervical spine: Normal marrow signal. Sinuses/Orbits: The paranasal sinuses are clear. Bilateral lens implants are in place. The globes and orbits are otherwise unremarkable. Other: There is a small right mastoid effusion, unchanged. IMPRESSION: 1. No acute intracranial pathology. 2. Unchanged remote infarct in the left occipital lobe and mild chronic white matter microangiopathy. 3. Absent right intracranial ICA flow void with reconstitution of the ACA and MCA, in keeping with the findings on the CTA head from 04/29/2021. Electronically Signed   By: Valetta Mole M.D.   On: 05/30/2021 13:58   DG Chest Port 1 View  Result Date: 06/15/2021 CLINICAL  DATA:  Shortness of breath EXAM: PORTABLE CHEST 1 VIEW COMPARISON:  06/08/2021 FINDINGS: Post sternotomy changes. Borderline cardiomegaly with mild central congestion. Patchy airspace opacities at  the bases. Aortic atherosclerosis. No pneumothorax. IMPRESSION: 1. Borderline cardiomegaly with vascular congestion. 2. Patchy left greater than right basilar airspace disease, possible pneumonia Electronically Signed   By: Donavan Foil M.D.   On: 06/15/2021 19:58   DG Chest Portable 1 View  Result Date: 06/08/2021 CLINICAL DATA:  Confusion, incontinence EXAM: PORTABLE CHEST 1 VIEW COMPARISON:  05/30/2021 FINDINGS: Cardiomegaly status post median sternotomy. Both lungs are clear. The visualized skeletal structures are unremarkable. IMPRESSION: Cardiomegaly without acute abnormality of the lungs in AP portable projection. Electronically Signed   By: Eddie Candle M.D.   On: 06/08/2021 14:41   DG Chest Port 1 View  Result Date: 05/30/2021 CLINICAL DATA:  Altered mental status. EXAM: PORTABLE CHEST 1 VIEW COMPARISON:  Chest x-ray dated April 28, 2021. FINDINGS: Unchanged mild cardiomegaly. Normal pulmonary vascularity. Retrocardiac left lower lobe opacity. No pleural effusion or pneumothorax. No acute osseous abnormality. IMPRESSION: 1. Retrocardiac left lower lobe atelectasis versus infiltrate. Electronically Signed   By: Titus Dubin M.D.   On: 05/30/2021 11:37   DG Knee Complete 4 Views Left  Result Date: 06/12/2021 CLINICAL DATA:  Left knee weakness. EXAM: LEFT KNEE - COMPLETE 4+ VIEW COMPARISON:  None. FINDINGS: No fracture or dislocation. Left knee joint spaces appear preserved. There is minimal spurring the tibial spines. No evidence of chondrocalcinosis. No joint effusion. Regional soft tissues appear normal. IMPRESSION: No explanation for patient's left knee weakness. Electronically Signed   By: Sandi Mariscal M.D.   On: 06/12/2021 15:05   EEG adult  Result Date: 06/17/2021 Lora Havens, MD     06/18/2021  8:27 AM Patient Name: George Bradley MRN: [id number] Epilepsy Attending: Lora Havens Referring Physician/Provider: Dr Shanon Brow Jania Steinke Date: 06/17/2021 Duration: 22.50 mins Patient history:  80yo M with ams. EEG to evaluate for seizure Level of alertness: Awake, asleep AEDs during EEG study: Gabapentin Technical aspects: This EEG study was done with scalp electrodes positioned according to the 10-20 International system of electrode placement. Electrical activity was acquired at a sampling rate of 500Hz  and reviewed with a high frequency filter of 70Hz  and a low frequency filter of 1Hz . EEG data were recorded continuously and digitally stored. Description: The posterior dominant rhythm consists of 7.5Hz  activity of moderate voltage (25-35 uV) seen predominantly in posterior head regions, symmetric and reactive to eye opening and eye closing. Sleep was characterized by vertex waves, sleep spindles (12 to 14 Hz), maximal frontocentral region. EEG showed intermittent generalized 3 to 6 Hz theta-delta slowing. Hyperventilation and photic stimulation were not performed.   ABNORMALITY - Intermittent slow, generalized IMPRESSION: This study is suggestive of mild diffuse encephalopathy, nonspecific etiology. No seizures or epileptiform discharges were seen throughout the recording. Priyanka Barbra Sarks   CT CHEST ABDOMEN PELVIS WO CONTRAST  Result Date: 05/30/2021 CLINICAL DATA:  Abdominal distension AKI new EXAM: CT CHEST, ABDOMEN AND PELVIS WITHOUT CONTRAST TECHNIQUE: Multidetector CT imaging of the chest, abdomen and pelvis was performed following the standard protocol without IV contrast. COMPARISON:  None. FINDINGS: Chest: Cardiovascular: Ascending thoracic aorta measures up to 4.0 x 3.8 cm. Diffuse atherosclerotic calcifications. Coronary artery calcifications. Normal heart size. No pericardial effusion. Mediastinum/Nodes: No enlarged mediastinal, hilar, or axillary lymph nodes. The thyroid gland appears normal. Lungs/Pleura: No pleural effusion. No pneumothorax. Ground-glass consolidation in the dependent bilateral lower lobes. No suspicious  pulmonary nodules. Abdomen: Hepatobiliary: The liver is normal  in size without focal abnormality. No intrahepatic or extrahepatic biliary ductal dilation. The gallbladder appears normal. Spleen: Normal in size without focal abnormality. Pancreas: No pancreatic ductal dilatation or surrounding inflammatory changes. Adrenals/Urinary Tract: Adrenal glands are unremarkable. Kidneys are normal, without renal calculi or hydronephrosis. Right simple renal cyst. Bladder is unremarkable. Stomach/Bowel: The stomach, small bowel and large bowel are normal in caliber without abnormal wall thickening or surrounding inflammatory changes. Stool ball in the rectal vault measures up to 8 cm. Reproductive: Prostate is unremarkable. Lymphatic: No enlarged lymph nodes in the abdomen or pelvis. Vasculature: The abdominal aorta is normal in caliber. Atherosclerotic calcifications, particularly of the femoral vessels. Other: No abdominopelvic ascites. Musculoskeletal: No aggressive osseous lesions. Multilevel degenerative changes of spine. Median sternotomy wires. The soft tissues are unremarkable. IMPRESSION: Chest: 1. Ground-glass consolidation in the dependent lower lobes, likely atelectasis. However, aspiration/pneumonia remains a differential consideration. 2. Ascending thoracic aorta measures up to 4.0 cm. Recommend annual imaging followup by CTA or MRA. This recommendation follows 2010 ACCF/AHA/AATS/ACR/ASA/SCA/SCAI/SIR/STS/SVM Guidelines for the Diagnosis and Management of Patients with Thoracic Aortic Disease. Circulation. 2010; 121: E423-N361. Aortic aneurysm NOS (ICD10-I71.9) 3. Coronary artery calcifications. Abdomen/pelvis: 1. Stool ball in the rectum measures up to 8 cm. No other acute findings in the abdomen or pelvis. Electronically Signed   By: Albin Felling M.D.   On: 05/30/2021 14:47    Microbiology: Recent Results (from the past 240 hour(s))  Resp Panel by RT-PCR (Flu A&B, Covid) Nasopharyngeal Swab     Status: None   Collection Time: 06/15/21  7:32 PM   Specimen:  Nasopharyngeal Swab; Nasopharyngeal(NP) swabs in vial transport medium  Result Value Ref Range Status   SARS Coronavirus 2 by RT PCR NEGATIVE NEGATIVE Final    Comment: (NOTE) SARS-CoV-2 target nucleic acids are NOT DETECTED.  The SARS-CoV-2 RNA is generally detectable in upper respiratory specimens during the acute phase of infection. The lowest concentration of SARS-CoV-2 viral copies this assay can detect is 138 copies/mL. A negative result does not preclude SARS-Cov-2 infection and should not be used as the sole basis for treatment or other patient management decisions. A negative result may occur with  improper specimen collection/handling, submission of specimen other than nasopharyngeal swab, presence of viral mutation(s) within the areas targeted by this assay, and inadequate number of viral copies(<138 copies/mL). A negative result must be combined with clinical observations, patient history, and epidemiological information. The expected result is Negative.  Fact Sheet for Patients:  EntrepreneurPulse.com.au  Fact Sheet for Healthcare Providers:  IncredibleEmployment.be  This test is no t yet approved or cleared by the [place] FDA and  has been authorized for detection and/or diagnosis of SARS-CoV-2 by FDA under an Emergency Use Authorization (EUA). This EUA will remain  in effect (meaning this test can be used) for the duration of the COVID-19 declaration under Section 564(b)(1) of the Act, 21 U.S.C.section 360bbb-3(b)(1), unless the authorization is terminated  or revoked sooner.       Influenza A by PCR NEGATIVE NEGATIVE Final   Influenza B by PCR NEGATIVE NEGATIVE Final    Comment: (NOTE) The Xpert Xpress SARS-CoV-2/FLU/RSV plus assay is intended as an aid in the diagnosis of influenza from Nasopharyngeal swab specimens and should not be used as a sole basis for treatment. Nasal washings and aspirates are unacceptable for  Xpert Xpress SARS-CoV-2/FLU/RSV testing.  Fact Sheet for Patients: EntrepreneurPulse.com.au  Fact Sheet for Healthcare Providers: IncredibleEmployment.be  This  test is not yet approved or cleared by the [place] and has been authorized for detection and/or diagnosis of SARS-CoV-2 by FDA under an Emergency Use Authorization (EUA). This EUA will remain in effect (meaning this test can be used) for the duration of the COVID-19 declaration under Section 564(b)(1) of the Act, 21 U.S.C. section 360bbb-3(b)(1), unless the authorization is terminated or revoked.  Performed at [facility], [address] [place], [place] [address]   Urine Culture     Status: Abnormal   Collection Time: 06/15/21  7:36 PM   Specimen: Urine, Clean Catch  Result Value Ref Range Status   Specimen Description   Final    URINE, CLEAN CATCH Performed at [facility], [address], [place], [place] [address]    Special Requests   Final    NONE Performed at [facility], [address], [place], [place] [address]    Culture (A)  Final    <10,000 COLONIES/mL INSIGNIFICANT GROWTH Performed at [facility] [address] [place], [place] [address]    Report Status 06/17/2021 FINAL  Final  Blood Culture (routine x 2)     Status: None (Preliminary result)   Collection Time: 06/15/21  7:50 PM   Specimen: BLOOD RIGHT ARM  Result Value Ref Range Status   Specimen Description BLOOD RIGHT ARM  Final   Special Requests   Final    BOTTLES DRAWN AEROBIC AND ANAEROBIC Blood Culture adequate volume   Culture   Final    NO GROWTH 3 DAYS Performed at [facility], [address], [place], [place] [address]    Report Status PENDING  Incomplete  Blood Culture (routine x 2)     Status: None (Preliminary result)   Collection Time: 06/15/21  7:55 PM   Specimen: BLOOD RIGHT FOREARM  Result Value Ref Range Status   Specimen Description BLOOD RIGHT FOREARM  Final   Special  Requests   Final    BOTTLES DRAWN AEROBIC AND ANAEROBIC Blood Culture adequate volume   Culture   Final    NO GROWTH 3 DAYS Performed at [facility], [address], [place], [place] [address]    Report Status PENDING  Incomplete  MRSA Next Gen by PCR, Nasal     Status: None   Collection Time: 06/16/21 12:04 AM   Specimen: Nasal Mucosa; Nasal Swab  Result Value Ref Range Status   MRSA by PCR Next Gen NOT DETECTED NOT DETECTED Final    Comment: (NOTE) The GeneXpert MRSA Assay (FDA approved for NASAL specimens only), is one component of a comprehensive MRSA colonization surveillance program. It is not intended to diagnose MRSA infection nor to guide or monitor treatment for MRSA infections. Test performance is not FDA approved in patients less than 95 years old. Performed at [facility], [address], [place], [place] [address]   Expectorated Sputum Assessment w Gram Stain, Rflx to Resp Cult     Status: None   Collection Time: 06/16/21 10:26 AM   Specimen: Sputum  Result Value Ref Range Status   Specimen Description SPUTUM  Final   Special Requests NONE  Final   Sputum evaluation   Final    THIS SPECIMEN IS ACCEPTABLE FOR SPUTUM CULTURE Performed at [facility], [address], [place], [place] [address]    Report Status 06/16/2021 FINAL  Final  Culture, Respiratory w Gram Stain     Status: None   Collection Time: 06/16/21 10:26 AM   Specimen: SPU  Result Value Ref Range Status   Specimen Description   Final    SPUTUM Performed at [facility], [address] [place], [place] [address]    Special Requests   Final    NONE Reflexed from [id number] Performed at [facility], [address] [place], [place] [address]    Gram Stain   Final    FEW SQUAMOUS EPITHELIAL CELLS PRESENT FEW WBC SEEN FEW GRAM POSITIVE COCCI    Culture   Final    FEW Normal respiratory flora-no Staph aureus or Pseudomonas seen Performed at [facility], [place] [address] [place], [place]  [address]    Report Status 06/18/2021 FINAL  Final  Resp Panel by RT-PCR (Flu A&B, Covid) Nasopharyngeal Swab     Status: None   Collection Time: 06/17/21  7:25 PM   Specimen: Nasopharyngeal Swab; Nasopharyngeal(NP) swabs in vial transport medium  Result Value Ref Range Status   SARS Coronavirus 2 by RT PCR NEGATIVE NEGATIVE Final    Comment: (NOTE) SARS-CoV-2 target nucleic acids are NOT DETECTED.  The SARS-CoV-2 RNA is generally detectable in upper respiratory specimens during the acute phase of infection. The lowest concentration of SARS-CoV-2 viral copies this assay can detect is 138 copies/mL. A negative result does not preclude SARS-Cov-2 infection and should not be used as the sole basis for treatment or other patient management decisions. A negative result may occur with  improper specimen collection/handling, submission of specimen other than nasopharyngeal swab, presence of viral mutation(s) within the areas targeted by this assay, and inadequate number of viral copies(<138 copies/mL). A negative result must be combined with clinical observations, patient history, and epidemiological information. The expected result is Negative.  Fact Sheet for Patients:  EntrepreneurPulse.com.au  Fact Sheet for Healthcare Providers:  IncredibleEmployment.be  This test is no t yet approved or cleared by the [place] FDA and  has been authorized for detection and/or diagnosis of SARS-CoV-2 by FDA under an Emergency Use Authorization (EUA). This EUA will remain  in effect (meaning this test can be used) for the duration of the COVID-19 declaration under Section 564(b)(1) of the Act, 21 U.S.C.section 360bbb-3(b)(1), unless the authorization is terminated  or revoked sooner.       Influenza A by PCR NEGATIVE NEGATIVE Final   Influenza B by PCR NEGATIVE NEGATIVE Final    Comment: (NOTE) The Xpert Xpress SARS-CoV-2/FLU/RSV plus assay is intended as an  aid in the diagnosis of influenza from Nasopharyngeal swab specimens and should not be used as a sole basis for treatment. Nasal washings and aspirates are unacceptable for Xpert Xpress SARS-CoV-2/FLU/RSV testing.  Fact Sheet for Patients: EntrepreneurPulse.com.au  Fact Sheet for Healthcare Providers: IncredibleEmployment.be  This test is not yet approved or cleared by the [place] FDA and has been authorized for detection and/or diagnosis of SARS-CoV-2 by FDA under an Emergency Use Authorization (EUA). This EUA will remain in effect (meaning this test can be used) for the duration of the COVID-19 declaration under Section 564(b)(1) of the Act, 21 U.S.C. section 360bbb-3(b)(1), unless the authorization is terminated or revoked.  Performed at [facility], [address], [place], [place] [address]      Labs: Basic Metabolic Panel: Recent Labs  Lab 06/12/21 1347 06/15/21 1932 06/16/21 0519 06/17/21 0548 06/18/21 1036  NA 140 140 140 140 138  K 4.0 4.0 3.9 3.4* 3.3*  CL 100 103 105 101 99  CO2 31 29 28 28 28   GLUCOSE 96 121* 117* 104* 129*  BUN 30* 17 15 12 14   CREATININE 1.54* 1.17 0.88 1.04 1.19  CALCIUM 9.5 8.8* 8.7* 9.5 9.3  MG  --   --  1.9  --  1.9  PHOS  --   --  4.1  --   --    Liver Function Tests: Recent Labs  Lab 06/12/21 1347 06/15/21 1932 06/16/21 0519 06/17/21 0548  AST 24 25 30  35  ALT 21 19 16 17   ALKPHOS 31* 35* 28* 36*  BILITOT 0.6 0.5 0.6 0.9  PROT 7.7 7.5 6.2* 7.6  ALBUMIN 3.4* 3.4* 2.9* 3.4*   No results for input(s): LIPASE, AMYLASE in the last 168 hours. No results for input(s): AMMONIA in the last 168 hours. CBC: Recent Labs  Lab 06/12/21 1347 06/15/21 1932 06/16/21 0519 06/17/21 0548 06/18/21 1036  WBC 12.7* 9.1 15.2* 12.7* 12.8*  NEUTROABS 9.5* 7.7  --  9.8*  --   HGB 12.3* 12.8* 10.7* 12.1* 12.5*  HCT 38.6* 38.3* 32.8* 37.1* 38.4*  MCV 96.3 95.3 94.0 94.2 94.6  PLT 210 198 178 196  189   Cardiac Enzymes: No results for input(s): CKTOTAL, CKMB, CKMBINDEX, TROPONINI in the last 168 hours. BNP: Invalid input(s): POCBNP CBG: No results for input(s): GLUCAP in the last 168 hours.  Time coordinating discharge:  36 minutes  Signed:  Orson Eva, DO [facility] Pager: [phone] 06/18/2021, 11:42 AM

## 2021-06-19 ENCOUNTER — Telehealth: Payer: Self-pay

## 2021-06-19 ENCOUNTER — Other Ambulatory Visit: Payer: Self-pay | Admitting: *Deleted

## 2021-06-19 ENCOUNTER — Telehealth: Payer: Self-pay | Admitting: *Deleted

## 2021-06-19 DIAGNOSIS — I1310 Hypertensive heart and chronic kidney disease without heart failure, with stage 1 through stage 4 chronic kidney disease, or unspecified chronic kidney disease: Secondary | ICD-10-CM | POA: Diagnosis not present

## 2021-06-19 DIAGNOSIS — N1831 Chronic kidney disease, stage 3a: Secondary | ICD-10-CM | POA: Diagnosis not present

## 2021-06-19 DIAGNOSIS — I951 Orthostatic hypotension: Secondary | ICD-10-CM | POA: Diagnosis not present

## 2021-06-19 DIAGNOSIS — N179 Acute kidney failure, unspecified: Secondary | ICD-10-CM | POA: Diagnosis not present

## 2021-06-19 DIAGNOSIS — J690 Pneumonitis due to inhalation of food and vomit: Secondary | ICD-10-CM | POA: Diagnosis not present

## 2021-06-19 DIAGNOSIS — F0390 Unspecified dementia without behavioral disturbance: Secondary | ICD-10-CM | POA: Diagnosis not present

## 2021-06-19 NOTE — Telephone Encounter (Signed)
Rec'd a call from Valente David, RN with [facility], stating that she had rec'd a call from patient's wife [name], stating that it had been awhile since NP had made a visit and would a call back to schedule.  I notified our Palliative RN/SW Team to return call to wife to schedule a Palliative f/u visit.

## 2021-06-19 NOTE — Telephone Encounter (Signed)
(  4:00 pm) SW scheduled follow-up visit with patient. Palliative care RN/SW visit scheduled for 07/02/21 @ 10 am.

## 2021-06-19 NOTE — Patient Outreach (Signed)
[facility]) Care Management  06/19/2021  XIAN ALVES [birth year] [id number]   Outgoing call placed to member's wife, state member declines each time he is discharged from the hospital.  Now a DNR and has hospital bed in the home.  Otherwise, state he is "ok."  Denies any urgent concerns, encouraged to contact this care manager with questions.  Agrees to follow up within the next month.   Goals Addressed             This Visit's Progress    THN - Keep or Improve My Strength-Stroke   Not on track    Timeframe:  Long-Range Goal Priority:  Medium Start Date:          4/20                   Expected End Date:   10/20                   Barriers: Health Behaviors    - arrange in-home help services - increase activity or exercise time a little every week - know who to call for help if I fall    Why is this important?   Before the stroke you probably did not think much about being safe when you are up and about.  Now, it may be harder for you to get around.  It may also be easier for you to trip or fall.  It is common to have muscle weakness after a stroke. You may also feel like you cannot control an arm or leg.  It will be helpful to work with a physical therapist to get your strength and muscle control back.  It is good to stay as active as you can. Walking and stretching help you stay strong and flexible.  The physical therapist will develop an exercise program just for you.     Notes:   4/20 - Discussed the possibility of having home health PT for member, not ordered at this time but wife will consider  5/11 - PT was not ordered after recent discharge, wife feels his BP may be too unstable to participate.  Advised to notify this care manager if they would like to consider  5/18 - Wife report patient is using exercise equipment more in the home, slowly increasing strength  6/6 - Per wife, member is getting stronger, walking better.  Will follow up with  neurology on 7/11  7/6 - Wife report ongoing improvement since having stroke.  Blood pressure remains labile but manageable.    8/3 - Per wife, member now able to walk without using walker, steadily improving.  8/10 - Recurrent stroke, now will have to restart PT/OT with outpatient rehab.    9/6 - Wife report improvement with ongoing outpatient therapy sessions.  Next appointment tomorrow.  9/28 - Therapy sessions with [name] restarted today.  Wife concerned about consistent decline in member's condition.  Discussed role of palliative care, wife verbalizes understanding.  Member has been changed to DNR status.  Concerned that member is not eating/drinking as he should, she is encouraging him to drink boost a couple times a day.     THN - Make and Keep All Appointments   On track    Timeframe:  Short-Term Goal Priority:  High Start Date:           6/6            Expected End Date:  10/6 (goal  extended due to readmission)           Barriers: Other - Dementia    - call to cancel if needed - keep a calendar with appointment dates    Why is this important?   Part of staying healthy is seeing the doctor for follow-up care.  If you forget your appointments, there are some things you can do to stay on track.    Notes:   4/20 - Conference call placed to Neurology office in attempt to schedule visit, unsuccessful, wife will call back  5/11 - Cardiology 5/16, PCP - 5/24, and Neurology 7/11  5/18 - Confirmed member attended cardiology on 5/16, no changes made to plan of care.  Other follow ups noted above, home visit with palliative care on 5/31  6/6 - Palliative care visit completed, next one scheduled for 6/28  7/7 - Palliative visit on 6/28 complete, PCP office visit scheduled for 7/11, no transportation needs identified  8/3 - PCP appointment completed as well as visit with cardiology due to extremity swelling.  Using compression socks and elevating legs for relief.  8/10 - Calls  placed to PCP and Neurology offices requesting follow up appointments per discharge instructions, voice messages left. Visit with oncology on 8/19 and cardiology on 8/26  9/6 - Was seen by Neurology yesterday, concerned with blood pressure, 182/77 in office. However he has hypotension at home throughout the day.  Medication prescribed to help with blood pressure during the day.  Also placed on Doxepin for sleep.  Follow up with neuro in January.  Head/neck CT scheduled for tomorrow.  Home visit from palliative care on 9/14  9/28 - Wife report home visit from Palliative car team didn't occur.  Call placed to [facility], they called member directly, home visit scheduled for 10/11.  Call will be placed to PCP office to schedule follow up.       Valente David, [place], MSN [place] [phone]

## 2021-06-20 ENCOUNTER — Other Ambulatory Visit: Payer: Self-pay

## 2021-06-20 DIAGNOSIS — J690 Pneumonitis due to inhalation of food and vomit: Secondary | ICD-10-CM | POA: Diagnosis not present

## 2021-06-20 DIAGNOSIS — N179 Acute kidney failure, unspecified: Secondary | ICD-10-CM | POA: Diagnosis not present

## 2021-06-20 DIAGNOSIS — N1831 Chronic kidney disease, stage 3a: Secondary | ICD-10-CM | POA: Diagnosis not present

## 2021-06-20 DIAGNOSIS — F0390 Unspecified dementia without behavioral disturbance: Secondary | ICD-10-CM | POA: Diagnosis not present

## 2021-06-20 DIAGNOSIS — I951 Orthostatic hypotension: Secondary | ICD-10-CM | POA: Diagnosis not present

## 2021-06-20 DIAGNOSIS — I1310 Hypertensive heart and chronic kidney disease without heart failure, with stage 1 through stage 4 chronic kidney disease, or unspecified chronic kidney disease: Secondary | ICD-10-CM | POA: Diagnosis not present

## 2021-06-20 LAB — CULTURE, BLOOD (ROUTINE X 2)
Culture: NO GROWTH
Culture: NO GROWTH
Special Requests: ADEQUATE
Special Requests: ADEQUATE

## 2021-06-20 MED ORDER — REPATHA SURECLICK 140 MG/ML ~~LOC~~ SOAJ: 140.0000 mg | SUBCUTANEOUS | 11 refills | Status: DC

## 2021-06-21 DIAGNOSIS — I951 Orthostatic hypotension: Secondary | ICD-10-CM | POA: Diagnosis not present

## 2021-06-21 DIAGNOSIS — I63231 Cerebral infarction due to unspecified occlusion or stenosis of right carotid arteries: Secondary | ICD-10-CM | POA: Diagnosis not present

## 2021-06-21 DIAGNOSIS — I6521 Occlusion and stenosis of right carotid artery: Secondary | ICD-10-CM | POA: Diagnosis not present

## 2021-06-21 DIAGNOSIS — N1831 Chronic kidney disease, stage 3a: Secondary | ICD-10-CM | POA: Diagnosis not present

## 2021-06-21 DIAGNOSIS — R413 Other amnesia: Secondary | ICD-10-CM | POA: Diagnosis not present

## 2021-06-21 DIAGNOSIS — E039 Hypothyroidism, unspecified: Secondary | ICD-10-CM | POA: Diagnosis not present

## 2021-06-21 DIAGNOSIS — I700 Atherosclerosis of aorta: Secondary | ICD-10-CM | POA: Diagnosis not present

## 2021-06-21 DIAGNOSIS — J690 Pneumonitis due to inhalation of food and vomit: Secondary | ICD-10-CM | POA: Diagnosis not present

## 2021-06-21 DIAGNOSIS — Z23 Encounter for immunization: Secondary | ICD-10-CM | POA: Diagnosis not present

## 2021-06-21 DIAGNOSIS — R1319 Other dysphagia: Secondary | ICD-10-CM | POA: Diagnosis not present

## 2021-06-21 DIAGNOSIS — E785 Hyperlipidemia, unspecified: Secondary | ICD-10-CM | POA: Diagnosis not present

## 2021-06-21 DIAGNOSIS — I2510 Atherosclerotic heart disease of native coronary artery without angina pectoris: Secondary | ICD-10-CM | POA: Diagnosis not present

## 2021-06-24 ENCOUNTER — Ambulatory Visit: Payer: Self-pay | Admitting: *Deleted

## 2021-06-24 DIAGNOSIS — J690 Pneumonitis due to inhalation of food and vomit: Secondary | ICD-10-CM | POA: Diagnosis not present

## 2021-06-24 DIAGNOSIS — I951 Orthostatic hypotension: Secondary | ICD-10-CM | POA: Diagnosis not present

## 2021-06-24 DIAGNOSIS — N1831 Chronic kidney disease, stage 3a: Secondary | ICD-10-CM | POA: Diagnosis not present

## 2021-06-24 DIAGNOSIS — I1310 Hypertensive heart and chronic kidney disease without heart failure, with stage 1 through stage 4 chronic kidney disease, or unspecified chronic kidney disease: Secondary | ICD-10-CM | POA: Diagnosis not present

## 2021-06-24 DIAGNOSIS — N179 Acute kidney failure, unspecified: Secondary | ICD-10-CM | POA: Diagnosis not present

## 2021-06-24 DIAGNOSIS — F0390 Unspecified dementia without behavioral disturbance: Secondary | ICD-10-CM | POA: Diagnosis not present

## 2021-06-25 DIAGNOSIS — F0390 Unspecified dementia without behavioral disturbance: Secondary | ICD-10-CM | POA: Diagnosis not present

## 2021-06-25 DIAGNOSIS — I951 Orthostatic hypotension: Secondary | ICD-10-CM | POA: Diagnosis not present

## 2021-06-25 DIAGNOSIS — N179 Acute kidney failure, unspecified: Secondary | ICD-10-CM | POA: Diagnosis not present

## 2021-06-25 DIAGNOSIS — J690 Pneumonitis due to inhalation of food and vomit: Secondary | ICD-10-CM | POA: Diagnosis not present

## 2021-06-25 DIAGNOSIS — N1831 Chronic kidney disease, stage 3a: Secondary | ICD-10-CM | POA: Diagnosis not present

## 2021-06-25 DIAGNOSIS — I1310 Hypertensive heart and chronic kidney disease without heart failure, with stage 1 through stage 4 chronic kidney disease, or unspecified chronic kidney disease: Secondary | ICD-10-CM | POA: Diagnosis not present

## 2021-06-26 DIAGNOSIS — N179 Acute kidney failure, unspecified: Secondary | ICD-10-CM | POA: Diagnosis not present

## 2021-06-26 DIAGNOSIS — J690 Pneumonitis due to inhalation of food and vomit: Secondary | ICD-10-CM | POA: Diagnosis not present

## 2021-06-26 DIAGNOSIS — N1831 Chronic kidney disease, stage 3a: Secondary | ICD-10-CM | POA: Diagnosis not present

## 2021-06-26 DIAGNOSIS — I951 Orthostatic hypotension: Secondary | ICD-10-CM | POA: Diagnosis not present

## 2021-06-26 DIAGNOSIS — I1310 Hypertensive heart and chronic kidney disease without heart failure, with stage 1 through stage 4 chronic kidney disease, or unspecified chronic kidney disease: Secondary | ICD-10-CM | POA: Diagnosis not present

## 2021-06-26 DIAGNOSIS — F0390 Unspecified dementia without behavioral disturbance: Secondary | ICD-10-CM | POA: Diagnosis not present

## 2021-07-01 ENCOUNTER — Ambulatory Visit: Payer: Self-pay | Admitting: *Deleted

## 2021-07-01 DIAGNOSIS — N179 Acute kidney failure, unspecified: Secondary | ICD-10-CM | POA: Diagnosis not present

## 2021-07-01 DIAGNOSIS — I1310 Hypertensive heart and chronic kidney disease without heart failure, with stage 1 through stage 4 chronic kidney disease, or unspecified chronic kidney disease: Secondary | ICD-10-CM | POA: Diagnosis not present

## 2021-07-01 DIAGNOSIS — R0902 Hypoxemia: Secondary | ICD-10-CM | POA: Diagnosis not present

## 2021-07-01 DIAGNOSIS — R000 Tachycardia, unspecified: Secondary | ICD-10-CM | POA: Diagnosis not present

## 2021-07-01 DIAGNOSIS — F0390 Unspecified dementia without behavioral disturbance: Secondary | ICD-10-CM | POA: Diagnosis not present

## 2021-07-01 DIAGNOSIS — N1831 Chronic kidney disease, stage 3a: Secondary | ICD-10-CM | POA: Diagnosis not present

## 2021-07-01 DIAGNOSIS — R509 Fever, unspecified: Secondary | ICD-10-CM | POA: Diagnosis not present

## 2021-07-01 DIAGNOSIS — I951 Orthostatic hypotension: Secondary | ICD-10-CM | POA: Diagnosis not present

## 2021-07-01 DIAGNOSIS — J690 Pneumonitis due to inhalation of food and vomit: Secondary | ICD-10-CM | POA: Diagnosis not present

## 2021-07-02 ENCOUNTER — Other Ambulatory Visit: Payer: Self-pay

## 2021-07-02 ENCOUNTER — Other Ambulatory Visit: Payer: Medicare Other | Admitting: *Deleted

## 2021-07-02 ENCOUNTER — Other Ambulatory Visit: Payer: Medicare Other

## 2021-07-02 VITALS — BP 100/60 | HR 63 | Temp 97.8°F | Resp 17

## 2021-07-02 DIAGNOSIS — Z515 Encounter for palliative care: Secondary | ICD-10-CM

## 2021-07-02 NOTE — Progress Notes (Signed)
COMMUNITY PALLIATIVE CARE SW NOTE  PATIENT NAME: George Bradley DOB: December 05, 1940 MRN: [id number]  PRIMARY CARE PROVIDER: [name], MD  RESPONSIBLE PARTY:  Acct ID - Guarantor Home Phone Work Phone Relationship Acct Type  [id number] OLUWASEYI, RAFFEL* [phone]  Self P/F     [address], [place], [place] [address]     PLAN OF CARE and INTERVENTIONS:             GOALS OF CARE/ ADVANCE CARE PLANNING:  Goal is for patient to remain in his home as independent and safe as possible. Patient is a DNR. SOCIAL/EMOTIONAL/SPIRITUAL ASSESSMENT/ INTERVENTIONS:  SW and RN- M. Nadara Mustard completed a follow-up visit with patient at his home. Patient was present with his wife-Claudia. Patient was standing as he he was trying to greet the team. Patient reported that he was dizzy and his legs was getting weak. The RN and his wife assisted him back to his chair. His wife provided an update on patient's status. She advised that the ambulance was  was called last night as patient would not eat and he begin to  having an episode where he started to feel unwell, he was running a 101.1 fever and his resting pulse was between 107-108 Patient's o2 saturations was 80 this morning. His wife expressed concern of patient possibly developing pneumonia. Patient has a history of dementia and is experiencing some sundowning. He has several episodes where  of trying to get up at night and falling down as recent as a few weeks ago. Patient report that he does have intermittent occasional pain to his stomach that he has medication on board for. He naps on off throughout the day. His appetite is good on average. He takes his medications whole with applesauce. Patient is independent for personal care, but his wife standby for emergencies. Patient has poor safety awareness, which concerns his wife. SW provided support to wife and briefly discussed options for care for patient where she would be able to do self care and spend time with family  members. SW to provide information/education regarding facility respite verses in-home respite. SW to forward resources for both to the wife. SW provided assessment of needs and comfort of patient, coping and needs of PCG; education, resources and support. Patient and PCG remain open to ongoing SW and palliative care visit.  PATIENT/CAREGIVER EDUCATION/ COPING:  Patent was alert and oriented x3, but cognitive deficits were present. He appeared to be in good spirits and was humorous. His wife is coping adequately, but is interested in resources for in-home care.  PERSONAL EMERGENCY PLAN:  911 can be activated for emergencies. COMMUNITY RESOURCES COORDINATION/ HEALTH CARE NAVIGATION:  SW to provide resources for in-home and respite resources.  FINANCIAL/LEGAL CONCERNS/INTERVENTIONS:  None.      SOCIAL HX:  Social History   Tobacco Use   Smoking status: Former    Types: Cigars    Quit date: 09/21/2009    Years since quitting: 11.7   Smokeless tobacco: Former   Tobacco comments:    2 cigars a week  Substance Use Topics   Alcohol use: Yes    Alcohol/week: 0.0 standard drinks    Comment: rare  wine    CODE STATUS: DNR ADVANCED DIRECTIVES: No MOST FORM COMPLETE: Yes HOSPICE EDUCATION PROVIDED: No  PPS: Patient is alert and oriented x3 with cognitive deficits. Patient has weakness, dizziness and poor safety awareness that makes him at risk for falls.   Duration of visit and documentation: 60 minutes  [address] [place], [place]

## 2021-07-03 DIAGNOSIS — J690 Pneumonitis due to inhalation of food and vomit: Secondary | ICD-10-CM | POA: Diagnosis not present

## 2021-07-03 DIAGNOSIS — I1310 Hypertensive heart and chronic kidney disease without heart failure, with stage 1 through stage 4 chronic kidney disease, or unspecified chronic kidney disease: Secondary | ICD-10-CM | POA: Diagnosis not present

## 2021-07-03 DIAGNOSIS — N179 Acute kidney failure, unspecified: Secondary | ICD-10-CM | POA: Diagnosis not present

## 2021-07-03 DIAGNOSIS — N1831 Chronic kidney disease, stage 3a: Secondary | ICD-10-CM | POA: Diagnosis not present

## 2021-07-03 DIAGNOSIS — F0390 Unspecified dementia without behavioral disturbance: Secondary | ICD-10-CM | POA: Diagnosis not present

## 2021-07-03 DIAGNOSIS — I951 Orthostatic hypotension: Secondary | ICD-10-CM | POA: Diagnosis not present

## 2021-07-03 NOTE — Progress Notes (Signed)
[facility] PALLIATIVE CARE RN NOTE  PATIENT NAME: George Bradley DOB: 06-07-1941 MRN: [id number]  PRIMARY CARE PROVIDER: [name], MD  RESPONSIBLE PARTY: Demetrios Isaacs (wife) Acct ID - Guarantor Home Phone Work Phone Relationship Acct Type  [id number] ADRICK, [name] [phone]  Self P/F     [address], [place], [place] [address]   Covid-19 Pre-screening Negative  PLAN OF CARE and INTERVENTION:  ADVANCE CARE PLANNING/GOALS OF CARE: Goal is for patient to remain at home with his wife.  PATIENT/CAREGIVER EDUCATION: Symptom management, safe mobility/transfers, fall prevention and s/s of infection DISEASE STATUS: Joint follow-up visit made with LCSW, M. Lonon. Met with patient and his wife in their home. Upon arrival, patient was standing by the stairwell holding onto the rail. He became dizzy and required 2 person assistance back to his chair. He reports frequent dizziness and sudden loss of control of his legs. He is high risk for falls. He says that when he loses his balance his leaps toward his chair or couch and has been successful in landing there vs the floor. He has a history of orthostatic hypotension. 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 At this point he is not a surgical candidate due to a risk of a massive stroke. He has a history of strokes and has left sided weakness. Last night wife had to call EMS. Patient was lying on the couch and he was so weak that he could hardly speak and needed significant help to the bathroom. He also had a temp of 101 and his heart rate was 107-108 for hours. His oxygen level was 80% on room air. EMS advised that an elevated pulse is common with fevers. They opted not to go to the hospital. Today he is afebrile and stronger than he was yesterday. No day is the same per wife. He completed an antibiotic about a week ago due to pneumonia. No shortness of breath today. Oxygen level is 90% on room air. No cough noted. Faint  crackles heard in right lobes. Advised that if this happens again, patient may need a repeat chest x-ray to make sure he is not developing another pneumonia. He has difficulty swallowing and takes his medications whole in applesauce. His appetite is fair. He usually eats breakfast and lunch, but often does not eat much at dinner time. He has a history of dementia. It flares up at night. He does experience pain in his stomach. Gabapentin is effective. He also takes Lorazepam 3x/day for anxiety. He has a follow up appointment with Dr. Virgina Jock on Nov 28th. Next appointment with his Neurologist is in January. He has a Therapist, sports that visits weekly with [name].   HISTORY OF PRESENT ILLNESS: This is a 80 yo male with a past medical history of CVA, mild aortic valve stenosis, orthostatic hypotension, dysphagia, dementia, history of tongue cancer, spinal stenosis and gait disturbance. Palliative care team continues to follow patient to for additional support, goals of care and complex decision making.     CODE STATUS: DNR ADVANCED DIRECTIVES: Y MOST FORM: yes PPS: 60%   PHYSICAL EXAM:   VITALS: Today's Vitals   07/02/21 1036  BP: 100/60  Pulse: 63  Resp: 17  Temp: 97.8 F (36.6 C)  TempSrc: Temporal  SpO2: 90%  PainSc: 0-No pain    LUNGS:  Faint crackles to right lobes, no shortness of breath and minimal coughing CARDIAC: Cor RRR EXTREMITIES: No edema SKIN:  Exposed skin is dry and intact  NEURO:  Alert and oriented to person/place/time, forgetful, intermittent confusion, generalized weakness, ambulatory   (Duration of visit and documentation 75 minutes)   Daryl Eastern, RN BSN

## 2021-07-04 DIAGNOSIS — N1831 Chronic kidney disease, stage 3a: Secondary | ICD-10-CM | POA: Diagnosis not present

## 2021-07-04 DIAGNOSIS — N179 Acute kidney failure, unspecified: Secondary | ICD-10-CM | POA: Diagnosis not present

## 2021-07-04 DIAGNOSIS — J690 Pneumonitis due to inhalation of food and vomit: Secondary | ICD-10-CM | POA: Diagnosis not present

## 2021-07-04 DIAGNOSIS — I1310 Hypertensive heart and chronic kidney disease without heart failure, with stage 1 through stage 4 chronic kidney disease, or unspecified chronic kidney disease: Secondary | ICD-10-CM | POA: Diagnosis not present

## 2021-07-04 DIAGNOSIS — I951 Orthostatic hypotension: Secondary | ICD-10-CM | POA: Diagnosis not present

## 2021-07-04 DIAGNOSIS — F0390 Unspecified dementia without behavioral disturbance: Secondary | ICD-10-CM | POA: Diagnosis not present

## 2021-07-15 DIAGNOSIS — H524 Presbyopia: Secondary | ICD-10-CM | POA: Diagnosis not present

## 2021-07-15 DIAGNOSIS — H4911 Fourth [trochlear] nerve palsy, right eye: Secondary | ICD-10-CM | POA: Diagnosis not present

## 2021-07-17 ENCOUNTER — Other Ambulatory Visit: Payer: Self-pay | Admitting: *Deleted

## 2021-07-17 NOTE — Patient Outreach (Signed)
  [facility]) Care Management  [place]  07/17/2021   LUCAN RINER May 04, 1941 [id number]   Outgoing call placed to member/wife.  No answer, HIPAA compliant voice message left.  Will follow up within the next 3-4 business days.  Valente David, [place], MSN [place] [phone]

## 2021-07-22 ENCOUNTER — Encounter (HOSPITAL_COMMUNITY): Payer: Self-pay | Admitting: Emergency Medicine

## 2021-07-22 ENCOUNTER — Other Ambulatory Visit: Payer: Self-pay

## 2021-07-22 ENCOUNTER — Emergency Department (HOSPITAL_COMMUNITY): Payer: Medicare Other

## 2021-07-22 ENCOUNTER — Inpatient Hospital Stay (HOSPITAL_COMMUNITY)
Admission: EM | Admit: 2021-07-22 | Discharge: 2021-07-31 | DRG: 871 | Disposition: A | Discharge status: Rehabilitation Facility | Payer: Medicare Other | Attending: Internal Medicine | Admitting: Internal Medicine

## 2021-07-22 DIAGNOSIS — R682 Dry mouth, unspecified: Secondary | ICD-10-CM | POA: Diagnosis present

## 2021-07-22 DIAGNOSIS — Z87891 Personal history of nicotine dependence: Secondary | ICD-10-CM

## 2021-07-22 DIAGNOSIS — F03A4 Unspecified dementia, mild, with anxiety: Secondary | ICD-10-CM | POA: Diagnosis present

## 2021-07-22 DIAGNOSIS — Z515 Encounter for palliative care: Secondary | ICD-10-CM | POA: Diagnosis not present

## 2021-07-22 DIAGNOSIS — Z85810 Personal history of malignant neoplasm of tongue: Secondary | ICD-10-CM | POA: Diagnosis present

## 2021-07-22 DIAGNOSIS — A419 Sepsis, unspecified organism: Principal | ICD-10-CM

## 2021-07-22 DIAGNOSIS — R509 Fever, unspecified: Principal | ICD-10-CM

## 2021-07-22 DIAGNOSIS — I129 Hypertensive chronic kidney disease with stage 1 through stage 4 chronic kidney disease, or unspecified chronic kidney disease: Secondary | ICD-10-CM | POA: Diagnosis present

## 2021-07-22 DIAGNOSIS — R6510 Systemic inflammatory response syndrome (SIRS) of non-infectious origin without acute organ dysfunction: Secondary | ICD-10-CM | POA: Diagnosis present

## 2021-07-22 DIAGNOSIS — I2510 Atherosclerotic heart disease of native coronary artery without angina pectoris: Secondary | ICD-10-CM | POA: Diagnosis present

## 2021-07-22 DIAGNOSIS — G629 Polyneuropathy, unspecified: Secondary | ICD-10-CM | POA: Diagnosis present

## 2021-07-22 DIAGNOSIS — I517 Cardiomegaly: Secondary | ICD-10-CM | POA: Diagnosis not present

## 2021-07-22 DIAGNOSIS — Z66 Do not resuscitate: Secondary | ICD-10-CM | POA: Diagnosis not present

## 2021-07-22 DIAGNOSIS — I951 Orthostatic hypotension: Secondary | ICD-10-CM | POA: Diagnosis present

## 2021-07-22 DIAGNOSIS — Z79890 Hormone replacement therapy (postmenopausal): Secondary | ICD-10-CM

## 2021-07-22 DIAGNOSIS — R0902 Hypoxemia: Secondary | ICD-10-CM

## 2021-07-22 DIAGNOSIS — J690 Pneumonitis due to inhalation of food and vomit: Secondary | ICD-10-CM | POA: Diagnosis present

## 2021-07-22 DIAGNOSIS — Z20822 Contact with and (suspected) exposure to covid-19: Secondary | ICD-10-CM | POA: Diagnosis present

## 2021-07-22 DIAGNOSIS — E890 Postprocedural hypothyroidism: Secondary | ICD-10-CM | POA: Diagnosis present

## 2021-07-22 DIAGNOSIS — Z7902 Long term (current) use of antithrombotics/antiplatelets: Secondary | ICD-10-CM

## 2021-07-22 DIAGNOSIS — E039 Hypothyroidism, unspecified: Secondary | ICD-10-CM | POA: Diagnosis present

## 2021-07-22 DIAGNOSIS — D472 Monoclonal gammopathy: Secondary | ICD-10-CM | POA: Diagnosis present

## 2021-07-22 DIAGNOSIS — Z79620 Long term (current) use of immunosuppressive biologic: Secondary | ICD-10-CM

## 2021-07-22 DIAGNOSIS — L8990 Pressure ulcer of unspecified site, unspecified stage: Secondary | ICD-10-CM | POA: Insufficient documentation

## 2021-07-22 DIAGNOSIS — R4182 Altered mental status, unspecified: Secondary | ICD-10-CM | POA: Diagnosis not present

## 2021-07-22 DIAGNOSIS — F0390 Unspecified dementia without behavioral disturbance: Secondary | ICD-10-CM | POA: Diagnosis present

## 2021-07-22 DIAGNOSIS — Z7982 Long term (current) use of aspirin: Secondary | ICD-10-CM

## 2021-07-22 DIAGNOSIS — G459 Transient cerebral ischemic attack, unspecified: Secondary | ICD-10-CM | POA: Diagnosis not present

## 2021-07-22 DIAGNOSIS — Z951 Presence of aortocoronary bypass graft: Secondary | ICD-10-CM

## 2021-07-22 DIAGNOSIS — L89152 Pressure ulcer of sacral region, stage 2: Secondary | ICD-10-CM | POA: Diagnosis present

## 2021-07-22 DIAGNOSIS — G9341 Metabolic encephalopathy: Secondary | ICD-10-CM | POA: Diagnosis present

## 2021-07-22 DIAGNOSIS — D696 Thrombocytopenia, unspecified: Secondary | ICD-10-CM | POA: Diagnosis present

## 2021-07-22 DIAGNOSIS — R404 Transient alteration of awareness: Secondary | ICD-10-CM | POA: Diagnosis not present

## 2021-07-22 DIAGNOSIS — I452 Bifascicular block: Secondary | ICD-10-CM | POA: Diagnosis present

## 2021-07-22 DIAGNOSIS — K219 Gastro-esophageal reflux disease without esophagitis: Secondary | ICD-10-CM | POA: Diagnosis present

## 2021-07-22 DIAGNOSIS — Z8249 Family history of ischemic heart disease and other diseases of the circulatory system: Secondary | ICD-10-CM

## 2021-07-22 DIAGNOSIS — R0602 Shortness of breath: Secondary | ICD-10-CM | POA: Diagnosis not present

## 2021-07-22 DIAGNOSIS — I440 Atrioventricular block, first degree: Secondary | ICD-10-CM | POA: Diagnosis present

## 2021-07-22 DIAGNOSIS — C9000 Multiple myeloma not having achieved remission: Secondary | ICD-10-CM | POA: Diagnosis present

## 2021-07-22 DIAGNOSIS — I352 Nonrheumatic aortic (valve) stenosis with insufficiency: Secondary | ICD-10-CM | POA: Diagnosis present

## 2021-07-22 DIAGNOSIS — N182 Chronic kidney disease, stage 2 (mild): Secondary | ICD-10-CM | POA: Diagnosis present

## 2021-07-22 DIAGNOSIS — Z83438 Family history of other disorder of lipoprotein metabolism and other lipidemia: Secondary | ICD-10-CM

## 2021-07-22 DIAGNOSIS — I69354 Hemiplegia and hemiparesis following cerebral infarction affecting left non-dominant side: Secondary | ICD-10-CM

## 2021-07-22 DIAGNOSIS — M25552 Pain in left hip: Secondary | ICD-10-CM

## 2021-07-22 DIAGNOSIS — Z888 Allergy status to other drugs, medicaments and biological substances status: Secondary | ICD-10-CM

## 2021-07-22 DIAGNOSIS — E785 Hyperlipidemia, unspecified: Secondary | ICD-10-CM | POA: Diagnosis present

## 2021-07-22 DIAGNOSIS — K222 Esophageal obstruction: Secondary | ICD-10-CM

## 2021-07-22 DIAGNOSIS — N400 Enlarged prostate without lower urinary tract symptoms: Secondary | ICD-10-CM | POA: Diagnosis present

## 2021-07-22 DIAGNOSIS — Z043 Encounter for examination and observation following other accident: Secondary | ICD-10-CM | POA: Diagnosis not present

## 2021-07-22 DIAGNOSIS — D631 Anemia in chronic kidney disease: Secondary | ICD-10-CM | POA: Diagnosis present

## 2021-07-22 DIAGNOSIS — Z923 Personal history of irradiation: Secondary | ICD-10-CM

## 2021-07-22 DIAGNOSIS — E876 Hypokalemia: Secondary | ICD-10-CM | POA: Diagnosis present

## 2021-07-22 DIAGNOSIS — R9431 Abnormal electrocardiogram [ECG] [EKG]: Secondary | ICD-10-CM | POA: Diagnosis not present

## 2021-07-22 DIAGNOSIS — I4510 Unspecified right bundle-branch block: Secondary | ICD-10-CM | POA: Diagnosis not present

## 2021-07-22 DIAGNOSIS — I471 Supraventricular tachycardia: Secondary | ICD-10-CM | POA: Diagnosis present

## 2021-07-22 DIAGNOSIS — Z79899 Other long term (current) drug therapy: Secondary | ICD-10-CM

## 2021-07-22 DIAGNOSIS — R55 Syncope and collapse: Secondary | ICD-10-CM

## 2021-07-22 DIAGNOSIS — J9601 Acute respiratory failure with hypoxia: Secondary | ICD-10-CM | POA: Diagnosis present

## 2021-07-22 DIAGNOSIS — R52 Pain, unspecified: Secondary | ICD-10-CM | POA: Diagnosis not present

## 2021-07-22 DIAGNOSIS — Z9221 Personal history of antineoplastic chemotherapy: Secondary | ICD-10-CM

## 2021-07-22 DIAGNOSIS — J811 Chronic pulmonary edema: Secondary | ICD-10-CM | POA: Diagnosis not present

## 2021-07-22 LAB — COMPREHENSIVE METABOLIC PANEL
ALT: 27 U/L (ref 0–44)
AST: 31 U/L (ref 15–41)
Albumin: 3.4 g/dL — ABNORMAL LOW (ref 3.5–5.0)
Alkaline Phosphatase: 37 U/L — ABNORMAL LOW (ref 38–126)
Anion gap: 9 (ref 5–15)
BUN: 20 mg/dL (ref 8–23)
CO2: 26 mmol/L (ref 22–32)
Calcium: 9.1 mg/dL (ref 8.9–10.3)
Chloride: 102 mmol/L (ref 98–111)
Creatinine, Ser: 1.26 mg/dL — ABNORMAL HIGH (ref 0.61–1.24)
GFR, Estimated: 58 mL/min — ABNORMAL LOW (ref >60)
Glucose, Bld: 104 mg/dL — ABNORMAL HIGH (ref 70–99)
Potassium: 4.4 mmol/L (ref 3.5–5.1)
Sodium: 137 mmol/L (ref 135–145)
Total Bilirubin: 0.8 mg/dL (ref 0.3–1.2)
Total Protein: 7.1 g/dL (ref 6.5–8.1)

## 2021-07-22 LAB — URINALYSIS, ROUTINE W REFLEX MICROSCOPIC
Bilirubin Urine: NEGATIVE
Glucose, UA: NEGATIVE mg/dL
Hgb urine dipstick: NEGATIVE
Ketones, ur: 5 mg/dL — AB
Leukocytes,Ua: NEGATIVE
Nitrite: NEGATIVE
Protein, ur: NEGATIVE mg/dL
Specific Gravity, Urine: 1.011 (ref 1.005–1.030)
pH: 7 (ref 5.0–8.0)

## 2021-07-22 LAB — CBC WITH DIFFERENTIAL/PLATELET
Abs Immature Granulocytes: 0.06 10*3/uL (ref 0.00–0.07)
Basophils Absolute: 0 10*3/uL (ref 0.0–0.1)
Basophils Relative: 0 %
Eosinophils Absolute: 0.1 10*3/uL (ref 0.0–0.5)
Eosinophils Relative: 1 %
HCT: 39.2 % (ref 39.0–52.0)
Hemoglobin: 12.8 g/dL — ABNORMAL LOW (ref 13.0–17.0)
Immature Granulocytes: 1 %
Lymphocytes Relative: 7 %
Lymphs Abs: 0.9 10*3/uL (ref 0.7–4.0)
MCH: 30.9 pg (ref 26.0–34.0)
MCHC: 32.7 g/dL (ref 30.0–36.0)
MCV: 94.7 fL (ref 80.0–100.0)
Monocytes Absolute: 1 10*3/uL (ref 0.1–1.0)
Monocytes Relative: 8 %
Neutro Abs: 10.5 10*3/uL — ABNORMAL HIGH (ref 1.7–7.7)
Neutrophils Relative %: 83 %
Platelets: 156 10*3/uL (ref 150–400)
RBC: 4.14 MIL/uL — ABNORMAL LOW (ref 4.22–5.81)
RDW: 15.4 % (ref 11.5–15.5)
WBC: 12.5 10*3/uL — ABNORMAL HIGH (ref 4.0–10.5)
nRBC: 0 % (ref 0.0–0.2)

## 2021-07-22 LAB — I-STAT ARTERIAL BLOOD GAS, ED
Acid-Base Excess: 4 mmol/L — ABNORMAL HIGH (ref 0.0–2.0)
Bicarbonate: 29.6 mmol/L — ABNORMAL HIGH (ref 20.0–28.0)
Calcium, Ion: 1.23 mmol/L (ref 1.15–1.40)
HCT: 38 % — ABNORMAL LOW (ref 39.0–52.0)
Hemoglobin: 12.9 g/dL — ABNORMAL LOW (ref 13.0–17.0)
O2 Saturation: 96 %
Patient temperature: 102.1
Potassium: 3.8 mmol/L (ref 3.5–5.1)
Sodium: 140 mmol/L (ref 135–145)
TCO2: 31 mmol/L (ref 22–32)
pCO2 arterial: 50.8 mmHg — ABNORMAL HIGH (ref 32.0–48.0)
pH, Arterial: 7.381 (ref 7.350–7.450)
pO2, Arterial: 90 mmHg (ref 83.0–108.0)

## 2021-07-22 LAB — LACTIC ACID, PLASMA
Lactic Acid, Venous: 1.8 mmol/L (ref 0.5–1.9)
Lactic Acid, Venous: 1.7 mmol/L (ref 0.5–1.9)

## 2021-07-22 LAB — TROPONIN I (HIGH SENSITIVITY)
Troponin I (High Sensitivity): 19 ng/L — ABNORMAL HIGH (ref <18)
Troponin I (High Sensitivity): 40 ng/L — ABNORMAL HIGH (ref <18)

## 2021-07-22 LAB — APTT: aPTT: 26 seconds (ref 24–36)

## 2021-07-22 LAB — RESP PANEL BY RT-PCR (FLU A&B, COVID) ARPGX2
Influenza A by PCR: NEGATIVE
Influenza B by PCR: NEGATIVE
SARS Coronavirus 2 by RT PCR: NEGATIVE

## 2021-07-22 LAB — PROTIME-INR
INR: 1.1 (ref 0.8–1.2)
Prothrombin Time: 14 seconds (ref 11.4–15.2)

## 2021-07-22 MED ORDER — SODIUM CHLORIDE 0.9 % IV SOLN
2.0000 g | Freq: Two times a day (BID) | INTRAVENOUS | Status: DC
  Administered 2021-07-23 – 2021-07-26 (×8): 2 g via INTRAVENOUS
  Filled 2021-07-22 (×9): qty 2

## 2021-07-22 MED ORDER — VANCOMYCIN HCL 1250 MG/250ML IV SOLN: 1250.0000 mg | INTRAVENOUS | Status: DC

## 2021-07-22 MED ORDER — LACTATED RINGERS IV BOLUS (SEPSIS)
500.0000 mL | Freq: Once | INTRAVENOUS | Status: AC
  Administered 2021-07-22: 500 mL via INTRAVENOUS

## 2021-07-22 MED ORDER — LACTATED RINGERS IV BOLUS (SEPSIS)
1000.0000 mL | Freq: Once | INTRAVENOUS | Status: AC
  Administered 2021-07-22: 1000 mL via INTRAVENOUS

## 2021-07-22 MED ORDER — LACTATED RINGERS IV SOLN: INTRAVENOUS | Status: DC

## 2021-07-22 MED ORDER — SODIUM CHLORIDE 0.9 % IV SOLN
2.0000 g | Freq: Once | INTRAVENOUS | Status: AC
  Administered 2021-07-22: 2 g via INTRAVENOUS
  Filled 2021-07-22: qty 2

## 2021-07-22 MED ORDER — VANCOMYCIN HCL 1500 MG/300ML IV SOLN
1500.0000 mg | Freq: Once | INTRAVENOUS | Status: AC
  Administered 2021-07-22: 1500 mg via INTRAVENOUS
  Filled 2021-07-22: qty 300

## 2021-07-22 MED ORDER — METRONIDAZOLE 500 MG/100ML IV SOLN
500.0000 mg | Freq: Once | INTRAVENOUS | Status: AC
  Administered 2021-07-22: 500 mg via INTRAVENOUS
  Filled 2021-07-22: qty 100

## 2021-07-22 NOTE — ED Notes (Signed)
Completed EKG per MD request.

## 2021-07-22 NOTE — Sepsis Progress Note (Signed)
[facility] is following this sepsis

## 2021-07-22 NOTE — ED Triage Notes (Addendum)
Pt BIB [facility] from home c/o generalized weakness. Hx stroke earlier this year with L sided deficits, hx sepsis/pna. Hx dementia with decline in mental status. Per family, he is not eating like he normally does. Pt was doing yard work today and got up a few steps, slipped and fell on bottom. Unable to stand with EMS. Episode of emesis with EMS. HR 135, was previously 150. RBBB on EKG. BP 170/90. CBG 107. 18g R-AC. Given 4mg  zofran en route.

## 2021-07-22 NOTE — ED Notes (Signed)
Upon entering room pt has non-rebreather off. Pt O2 91 on room air.
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
## 2021-07-22 NOTE — ED Notes (Signed)
Tomi Bamberger, MD at bedside.

## 2021-07-22 NOTE — ED Notes (Signed)
RN placed non-rebreather back on because pt O2 dropped to 86% while sleeping. It has risen to 94% with non-rebreather.

## 2021-07-22 NOTE — Progress Notes (Signed)
Pharmacy Antibiotic Note  George Bradley is a 80 y.o. male admitted on 07/22/2021 with sepsis.  Pharmacy has been consulted for vancomycin and cefepime dosing.  Presenting from home with generalized weakness - has history of dementia. WBC 12.5, LA 1.8, temp 102.1. Scr 1.26 (CrCl 51 mL/min).   Plan: Cefepime 2g IV every 12 hours Vancomycin 1500 mg IV once then 1250 mg IV every 24 hours (estAUC 481, Scr 1.26) Monitor renal fx, cx results, clinical pic, and LOT   Weight: 77.1 kg (170 lb)  Temp (24hrs), Avg:102.1 F (38.9 C), Min:102.1 F (38.9 C), Max:102.1 F (38.9 C)  Recent Labs  Lab 07/22/21 1807  WBC 12.5*  CREATININE 1.26*  LATICACIDVEN 1.8    Estimated Creatinine Clearance: 51 mL/min (A) (by C-G formula based on SCr of 1.26 mg/dL (H)).    Allergies  Allergen Reactions   Doxepin Other (See Comments)    Left-sided weakness appeared the morning after this was first taken   Zetia [Ezetimibe] Other (See Comments)    "made me feel bad"    Antimicrobials this admission: Vancomycin 10/31 >>  Cefepime 10/31 >>   Dose adjustments this admission: N/A  Microbiology results: 10/31 BCx: sent 10/31 UCx: sent  10/31 COVID/Flu PCR: sent   Thank you for allowing pharmacy to be a part of this patient's care.  Antonietta Jewel, PharmD, [place] Clinical Pharmacist  Phone: [phone] 07/22/2021 9:09 PM  Please check AMION for all [place] phone numbers After 10:00 PM, call [facility] [phone]

## 2021-07-23 ENCOUNTER — Ambulatory Visit: Payer: Self-pay | Admitting: *Deleted

## 2021-07-23 ENCOUNTER — Encounter (HOSPITAL_COMMUNITY): Payer: Self-pay | Admitting: Internal Medicine

## 2021-07-23 ENCOUNTER — Inpatient Hospital Stay (HOSPITAL_COMMUNITY): Payer: Medicare Other

## 2021-07-23 DIAGNOSIS — Z9221 Personal history of antineoplastic chemotherapy: Secondary | ICD-10-CM | POA: Diagnosis not present

## 2021-07-23 DIAGNOSIS — L89151 Pressure ulcer of sacral region, stage 1: Secondary | ICD-10-CM | POA: Diagnosis present

## 2021-07-23 DIAGNOSIS — Z87891 Personal history of nicotine dependence: Secondary | ICD-10-CM | POA: Diagnosis not present

## 2021-07-23 DIAGNOSIS — Z7982 Long term (current) use of aspirin: Secondary | ICD-10-CM | POA: Diagnosis not present

## 2021-07-23 DIAGNOSIS — F03A0 Unspecified dementia, mild, without behavioral disturbance, psychotic disturbance, mood disturbance, and anxiety: Secondary | ICD-10-CM | POA: Diagnosis not present

## 2021-07-23 DIAGNOSIS — L89152 Pressure ulcer of sacral region, stage 2: Secondary | ICD-10-CM | POA: Diagnosis present

## 2021-07-23 DIAGNOSIS — D631 Anemia in chronic kidney disease: Secondary | ICD-10-CM | POA: Diagnosis present

## 2021-07-23 DIAGNOSIS — G9341 Metabolic encephalopathy: Secondary | ICD-10-CM | POA: Diagnosis present

## 2021-07-23 DIAGNOSIS — T451X5A Adverse effect of antineoplastic and immunosuppressive drugs, initial encounter: Secondary | ICD-10-CM | POA: Diagnosis present

## 2021-07-23 DIAGNOSIS — R5381 Other malaise: Secondary | ICD-10-CM | POA: Diagnosis not present

## 2021-07-23 DIAGNOSIS — I352 Nonrheumatic aortic (valve) stenosis with insufficiency: Secondary | ICD-10-CM | POA: Diagnosis present

## 2021-07-23 DIAGNOSIS — I6523 Occlusion and stenosis of bilateral carotid arteries: Secondary | ICD-10-CM | POA: Diagnosis not present

## 2021-07-23 DIAGNOSIS — I639 Cerebral infarction, unspecified: Secondary | ICD-10-CM | POA: Diagnosis not present

## 2021-07-23 DIAGNOSIS — I771 Stricture of artery: Secondary | ICD-10-CM | POA: Diagnosis not present

## 2021-07-23 DIAGNOSIS — Z515 Encounter for palliative care: Secondary | ICD-10-CM | POA: Diagnosis not present

## 2021-07-23 DIAGNOSIS — G620 Drug-induced polyneuropathy: Secondary | ICD-10-CM | POA: Diagnosis present

## 2021-07-23 DIAGNOSIS — R0602 Shortness of breath: Secondary | ICD-10-CM | POA: Diagnosis not present

## 2021-07-23 DIAGNOSIS — Z803 Family history of malignant neoplasm of breast: Secondary | ICD-10-CM | POA: Diagnosis not present

## 2021-07-23 DIAGNOSIS — F0394 Unspecified dementia, unspecified severity, with anxiety: Secondary | ICD-10-CM | POA: Diagnosis present

## 2021-07-23 DIAGNOSIS — Y842 Radiological procedure and radiotherapy as the cause of abnormal reaction of the patient, or of later complication, without mention of misadventure at the time of the procedure: Secondary | ICD-10-CM | POA: Diagnosis present

## 2021-07-23 DIAGNOSIS — E785 Hyperlipidemia, unspecified: Secondary | ICD-10-CM | POA: Diagnosis present

## 2021-07-23 DIAGNOSIS — I452 Bifascicular block: Secondary | ICD-10-CM | POA: Diagnosis present

## 2021-07-23 DIAGNOSIS — A419 Sepsis, unspecified organism: Secondary | ICD-10-CM | POA: Diagnosis present

## 2021-07-23 DIAGNOSIS — I69354 Hemiplegia and hemiparesis following cerebral infarction affecting left non-dominant side: Secondary | ICD-10-CM | POA: Diagnosis not present

## 2021-07-23 DIAGNOSIS — Z8249 Family history of ischemic heart disease and other diseases of the circulatory system: Secondary | ICD-10-CM | POA: Diagnosis not present

## 2021-07-23 DIAGNOSIS — Z888 Allergy status to other drugs, medicaments and biological substances status: Secondary | ICD-10-CM | POA: Diagnosis not present

## 2021-07-23 DIAGNOSIS — Z923 Personal history of irradiation: Secondary | ICD-10-CM | POA: Diagnosis not present

## 2021-07-23 DIAGNOSIS — D696 Thrombocytopenia, unspecified: Secondary | ICD-10-CM | POA: Diagnosis present

## 2021-07-23 DIAGNOSIS — R0902 Hypoxemia: Secondary | ICD-10-CM | POA: Diagnosis not present

## 2021-07-23 DIAGNOSIS — I672 Cerebral atherosclerosis: Secondary | ICD-10-CM | POA: Diagnosis not present

## 2021-07-23 DIAGNOSIS — J9601 Acute respiratory failure with hypoxia: Secondary | ICD-10-CM | POA: Diagnosis present

## 2021-07-23 DIAGNOSIS — C9000 Multiple myeloma not having achieved remission: Secondary | ICD-10-CM | POA: Diagnosis present

## 2021-07-23 DIAGNOSIS — R6510 Systemic inflammatory response syndrome (SIRS) of non-infectious origin without acute organ dysfunction: Secondary | ICD-10-CM | POA: Diagnosis not present

## 2021-07-23 DIAGNOSIS — G7000 Myasthenia gravis without (acute) exacerbation: Secondary | ICD-10-CM | POA: Diagnosis present

## 2021-07-23 DIAGNOSIS — I129 Hypertensive chronic kidney disease with stage 1 through stage 4 chronic kidney disease, or unspecified chronic kidney disease: Secondary | ICD-10-CM | POA: Diagnosis present

## 2021-07-23 DIAGNOSIS — R531 Weakness: Secondary | ICD-10-CM | POA: Diagnosis not present

## 2021-07-23 DIAGNOSIS — I6521 Occlusion and stenosis of right carotid artery: Secondary | ICD-10-CM | POA: Diagnosis present

## 2021-07-23 DIAGNOSIS — R55 Syncope and collapse: Secondary | ICD-10-CM | POA: Diagnosis not present

## 2021-07-23 DIAGNOSIS — Z79890 Hormone replacement therapy (postmenopausal): Secondary | ICD-10-CM | POA: Diagnosis not present

## 2021-07-23 DIAGNOSIS — I6503 Occlusion and stenosis of bilateral vertebral arteries: Secondary | ICD-10-CM | POA: Diagnosis not present

## 2021-07-23 DIAGNOSIS — R1310 Dysphagia, unspecified: Secondary | ICD-10-CM | POA: Diagnosis present

## 2021-07-23 DIAGNOSIS — Z66 Do not resuscitate: Secondary | ICD-10-CM | POA: Diagnosis present

## 2021-07-23 DIAGNOSIS — F03A4 Unspecified dementia, mild, with anxiety: Secondary | ICD-10-CM | POA: Diagnosis present

## 2021-07-23 DIAGNOSIS — M25552 Pain in left hip: Secondary | ICD-10-CM | POA: Diagnosis not present

## 2021-07-23 DIAGNOSIS — Z7189 Other specified counseling: Secondary | ICD-10-CM | POA: Diagnosis not present

## 2021-07-23 DIAGNOSIS — J690 Pneumonitis due to inhalation of food and vomit: Secondary | ICD-10-CM | POA: Diagnosis present

## 2021-07-23 DIAGNOSIS — K222 Esophageal obstruction: Secondary | ICD-10-CM | POA: Diagnosis present

## 2021-07-23 DIAGNOSIS — E890 Postprocedural hypothyroidism: Secondary | ICD-10-CM | POA: Diagnosis present

## 2021-07-23 DIAGNOSIS — R1312 Dysphagia, oropharyngeal phase: Secondary | ICD-10-CM | POA: Diagnosis not present

## 2021-07-23 DIAGNOSIS — Z85810 Personal history of malignant neoplasm of tongue: Secondary | ICD-10-CM | POA: Diagnosis not present

## 2021-07-23 DIAGNOSIS — I951 Orthostatic hypotension: Secondary | ICD-10-CM | POA: Diagnosis present

## 2021-07-23 DIAGNOSIS — F419 Anxiety disorder, unspecified: Secondary | ICD-10-CM | POA: Diagnosis present

## 2021-07-23 DIAGNOSIS — Z20822 Contact with and (suspected) exposure to covid-19: Secondary | ICD-10-CM | POA: Diagnosis present

## 2021-07-23 DIAGNOSIS — N183 Chronic kidney disease, stage 3 unspecified: Secondary | ICD-10-CM | POA: Diagnosis present

## 2021-07-23 DIAGNOSIS — I471 Supraventricular tachycardia: Secondary | ICD-10-CM | POA: Diagnosis present

## 2021-07-23 DIAGNOSIS — G319 Degenerative disease of nervous system, unspecified: Secondary | ICD-10-CM | POA: Diagnosis not present

## 2021-07-23 DIAGNOSIS — Z79899 Other long term (current) drug therapy: Secondary | ICD-10-CM | POA: Diagnosis not present

## 2021-07-23 LAB — URINE CULTURE: Culture: NO GROWTH

## 2021-07-23 LAB — CBC
HCT: 33.9 % — ABNORMAL LOW (ref 39.0–52.0)
Hemoglobin: 11 g/dL — ABNORMAL LOW (ref 13.0–17.0)
MCH: 30.3 pg (ref 26.0–34.0)
MCHC: 32.4 g/dL (ref 30.0–36.0)
MCV: 93.4 fL (ref 80.0–100.0)
Platelets: 136 10*3/uL — ABNORMAL LOW (ref 150–400)
RBC: 3.63 MIL/uL — ABNORMAL LOW (ref 4.22–5.81)
RDW: 15.4 % (ref 11.5–15.5)
WBC: 15.4 10*3/uL — ABNORMAL HIGH (ref 4.0–10.5)
nRBC: 0 % (ref 0.0–0.2)

## 2021-07-23 LAB — COMPREHENSIVE METABOLIC PANEL
ALT: 22 U/L (ref 0–44)
AST: 22 U/L (ref 15–41)
Albumin: 2.8 g/dL — ABNORMAL LOW (ref 3.5–5.0)
Alkaline Phosphatase: 28 U/L — ABNORMAL LOW (ref 38–126)
Anion gap: 7 (ref 5–15)
BUN: 16 mg/dL (ref 8–23)
CO2: 27 mmol/L (ref 22–32)
Calcium: 8.8 mg/dL — ABNORMAL LOW (ref 8.9–10.3)
Chloride: 103 mmol/L (ref 98–111)
Creatinine, Ser: 1.09 mg/dL (ref 0.61–1.24)
GFR, Estimated: >60 mL/min (ref >60)
Glucose, Bld: 124 mg/dL — ABNORMAL HIGH (ref 70–99)
Potassium: 4.2 mmol/L (ref 3.5–5.1)
Sodium: 137 mmol/L (ref 135–145)
Total Bilirubin: 0.6 mg/dL (ref 0.3–1.2)
Total Protein: 6.3 g/dL — ABNORMAL LOW (ref 6.5–8.1)

## 2021-07-23 LAB — PROCALCITONIN: Procalcitonin: 2.25 ng/mL

## 2021-07-23 LAB — TROPONIN I (HIGH SENSITIVITY): Troponin I (High Sensitivity): 68 ng/L — ABNORMAL HIGH (ref <18)

## 2021-07-23 LAB — BRAIN NATRIURETIC PEPTIDE: B Natriuretic Peptide: 310.6 pg/mL — ABNORMAL HIGH (ref 0.0–100.0)

## 2021-07-23 MED ORDER — ACETAMINOPHEN 325 MG PO TABS
650.0000 mg | ORAL_TABLET | Freq: Four times a day (QID) | ORAL | Status: DC | PRN
Start: 2021-07-23 — End: 2021-07-30
  Administered 2021-07-25 – 2021-07-28 (×3): 650 mg via ORAL
  Filled 2021-07-23 (×3): qty 2

## 2021-07-23 MED ORDER — ENOXAPARIN SODIUM 40 MG/0.4ML IJ SOSY
40.0000 mg | PREFILLED_SYRINGE | Freq: Every day | INTRAMUSCULAR | Status: DC
  Administered 2021-07-23 – 2021-07-27 (×5): 40 mg via SUBCUTANEOUS
  Filled 2021-07-23 (×5): qty 0.4

## 2021-07-23 MED ORDER — LEVOTHYROXINE SODIUM 100 MCG PO TABS
100.0000 ug | ORAL_TABLET | Freq: Every day | ORAL | Status: DC
  Administered 2021-07-24 – 2021-07-31 (×8): 100 ug via ORAL
  Filled 2021-07-23 (×8): qty 1

## 2021-07-23 MED ORDER — FLUDROCORTISONE ACETATE 0.1 MG PO TABS
0.1000 mg | ORAL_TABLET | Freq: Every day | ORAL | Status: DC
  Administered 2021-07-23 – 2021-07-31 (×9): 0.1 mg via ORAL
  Filled 2021-07-23 (×9): qty 1

## 2021-07-23 MED ORDER — ASPIRIN EC 81 MG PO TBEC
81.0000 mg | DELAYED_RELEASE_TABLET | Freq: Every day | ORAL | Status: DC
  Administered 2021-07-23 – 2021-07-27 (×5): 81 mg via ORAL
  Filled 2021-07-23 (×5): qty 1

## 2021-07-23 MED ORDER — TICAGRELOR 90 MG PO TABS
90.0000 mg | ORAL_TABLET | Freq: Two times a day (BID) | ORAL | Status: DC
  Administered 2021-07-23 – 2021-07-27 (×9): 90 mg via ORAL
  Filled 2021-07-23 (×9): qty 1

## 2021-07-23 MED ORDER — LORAZEPAM 1 MG PO TABS
1.0000 mg | ORAL_TABLET | Freq: Three times a day (TID) | ORAL | Status: DC
  Administered 2021-07-23 – 2021-07-27 (×12): 1 mg via ORAL
  Filled 2021-07-23 (×12): qty 1

## 2021-07-23 MED ORDER — GABAPENTIN 300 MG PO CAPS
900.0000 mg | ORAL_CAPSULE | Freq: Three times a day (TID) | ORAL | Status: DC
  Administered 2021-07-23 – 2021-07-27 (×12): 900 mg via ORAL
  Filled 2021-07-23 (×12): qty 3

## 2021-07-23 MED ORDER — VANCOMYCIN HCL 1500 MG/300ML IV SOLN
1500.0000 mg | INTRAVENOUS | Status: DC
  Administered 2021-07-23: 1500 mg via INTRAVENOUS
  Filled 2021-07-23 (×2): qty 300

## 2021-07-23 MED ORDER — PYRIDOSTIGMINE BROMIDE 60 MG PO TABS
60.0000 mg | ORAL_TABLET | Freq: Three times a day (TID) | ORAL | Status: DC
  Administered 2021-07-23 – 2021-07-27 (×12): 60 mg via ORAL
  Filled 2021-07-23 (×15): qty 1

## 2021-07-23 MED ORDER — DONEPEZIL HCL 5 MG PO TABS
5.0000 mg | ORAL_TABLET | Freq: Every day | ORAL | Status: DC
  Administered 2021-07-23 – 2021-07-30 (×9): 5 mg via ORAL
  Filled 2021-07-23 (×10): qty 1

## 2021-07-23 MED ORDER — PILOCARPINE HCL 5 MG PO TABS
7.5000 mg | ORAL_TABLET | Freq: Three times a day (TID) | ORAL | Status: DC
  Administered 2021-07-23 – 2021-07-27 (×12): 7.5 mg via ORAL
  Filled 2021-07-23 (×14): qty 2

## 2021-07-23 MED ORDER — ROSUVASTATIN CALCIUM 20 MG PO TABS
20.0000 mg | ORAL_TABLET | Freq: Every day | ORAL | Status: DC
  Administered 2021-07-23 – 2021-07-30 (×9): 20 mg via ORAL
  Filled 2021-07-23 (×9): qty 1

## 2021-07-23 MED ORDER — ACETAMINOPHEN 650 MG RE SUPP
650.0000 mg | Freq: Four times a day (QID) | RECTAL | Status: DC | PRN
Start: 2021-07-23 — End: 2021-07-30
  Administered 2021-07-26 – 2021-07-28 (×2): 650 mg via RECTAL
  Filled 2021-07-23 (×2): qty 1

## 2021-07-23 MED ORDER — ENSURE ENLIVE PO LIQD
237.0000 mL | Freq: Two times a day (BID) | ORAL | Status: DC
  Filled 2021-07-23 (×2): qty 237

## 2021-07-23 MED ORDER — LACTATED RINGERS IV SOLN: INTRAVENOUS | Status: DC

## 2021-07-23 MED ORDER — LORAZEPAM 1 MG PO TABS
1.0000 mg | ORAL_TABLET | Freq: Once | ORAL | Status: AC
  Administered 2021-07-23: 1 mg via ORAL
  Filled 2021-07-23: qty 1
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
## 2021-07-23 NOTE — Progress Notes (Signed)
PROGRESS NOTE   George Bradley  CLE:[id number]    DOB: [birth year]    DOA: 07/22/2021  PCP: [name], MD   I have briefly reviewed patients previous medical records in [facility].  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  Admitted for sepsis due to suspected aspiration pneumonia.   Assessment & Plan:  Principal Problem:   SIRS (systemic inflammatory response syndrome) (HCC) Active Problems:   History of tongue cancer   Orthostatic hypotension   Hypothyroidism   MGUS (monoclonal gammopathy of unknown significance)   Dementia (HCC)   Sepsis due to suspected aspiration pneumonia: - Met sepsis criteria on admission including fever of 102.1, tachypnea in the 20s, tachycardia up to 130, initial BP of 169/146 and was initially on nonrebreather mask at 15 L/min.  WBC 15.4.  Lactate 1.7.  Chest x-ray showed increasing opacity in the medial right lung base. - Appears to have prior history of dysphagia and aspiration and multiple esophageal interventions.  Per spouse, has had multiple speech therapy evaluations but remains on regular consistency diet. - Continue empirically started IV cefepime and vancomycin. - Check and trend  procalcitonin.  SARS coronavirus 2 and flu panel RT-PCR negative.  Blood cultures: Negative to date. - Since patient was hypertensive with SBP in the 200s, was not given 30 mL/kg bolus per sepsis protocol. - Although patient may have been in respiratory distress, he was not clearly hypoxic and had been placed on nonrebreather mask.  Also ABG showed PO2 of 90.  Thereby did not meet criteria for acute respiratory failure with hypoxia. - We will get speech therapy consultation for swallow evaluation but per wife, he has had multiple evaluations in the past.  Acute metabolic encephalopathy: - Likely related to sepsis complicating underlying dementia - CT head without acute intracranial abnormality. - As per spouse at bedside, this has resolved.  Monitor.  Leukocytosis - Secondary to sepsis and pneumonia - Follow daily CBC  Thrombocytopenia - Secondary to acute illness and infectious etiology. - Follow CBC in AM.  S/p mechanical fall - X-ray of pelvis negative.  PT and OT evaluation.  Stage II chronic kidney disease - Creatinine is improved from 1.26 on admission to 1.09.  History of orthostatic hypotension - Remains on fludrocortisone.  IV fluids avoided due to pulmonary edema on chest x-ray, respiratory distress and elevated BNP  History of tongue cancer, s/p radiation and chemotherapy - Reportedly in remission.  Follows with Dr. Alvy Bimler - Takes pilocarpine for dry mouth.  Hypothyroidism - Continue Synthroid  Dementia without behavioral abnormalities - Mental status currently at baseline.  Continue prior home meds.  Anemia - Could be due to critical illness. - Follow CBCs.  History of CVA with right-sided carotid occlusion - Continue aspirin, Brilinta and statins  Neuropathy - Continue gabapentin  Anxiety disorder - On lorazepam.   Body mass  index is 22.43 kg/m.   DVT prophylaxis: enoxaparin (LOVENOX) injection 40 mg Start: 07/23/21 0115     Code Status:  DNR Family Communication: Spouse at bedside. Disposition:  Status is: Inpatient  Remains inpatient appropriate because: Sepsis due to aspiration pneumonia, IV antibiotics.        Consultants:   None.  Procedures:   None.  Antimicrobials:    Anti-infectives (From admission, onward)    Start     Dose/Rate Route Frequency Ordered Stop   07/23/21 2100  vancomycin (VANCOREADY) IVPB 1250 mg/250 mL  Status:  Discontinued        1,250 mg 166.7 mL/hr over 90 Minutes Intravenous Every 24 hours 07/22/21 2110 07/23/21 0905   07/23/21 2100  vancomycin (VANCOREADY) IVPB 1500 mg/300 mL        1,500 mg 150 mL/hr over 120 Minutes Intravenous Every 24 hours 07/23/21 0905     07/23/21 0700  ceFEPIme (MAXIPIME) 2 g in sodium chloride 0.9 % 100 mL IVPB        2 g 200 mL/hr over 30 Minutes Intravenous Every 12 hours 07/22/21 2110     07/22/21 1830  ceFEPIme (MAXIPIME) 2 g in sodium chloride 0.9 % 100 mL IVPB        2 g 200 mL/hr over 30 Minutes Intravenous  Once 07/22/21 1816 07/22/21 1929   07/22/21 1830  metroNIDAZOLE (FLAGYL) IVPB 500 mg        500 mg 100 mL/hr over 60 Minutes Intravenous  Once 07/22/21 1816 07/22/21 1958   07/22/21 1830  vancomycin (VANCOREADY) IVPB 1500 mg/300 mL        1,500 mg 150 mL/hr over 120 Minutes Intravenous  Once 07/22/21 1816 07/22/21 2305         Subjective:  Seen early this afternoon in ED.  Spouse at bedside.  She indicates that his confusion has resolved and his mental status is back to baseline.  She does volunteer that he has history of dementia.  Patient reports coughing with intermittent yellow/[place] sputum, chest pain only on coughing.  Spouse states that after the fall yesterday, he had "flat affect, weak, sleepy, slurred speech" and hence had to call EMT at which time BP was okay.  Sees Dr. Felecia Shelling, neurology for some stiffness issues.  Last BM 5 days ago.  Objective:   Vitals:   07/23/21 1153 07/23/21 1230 07/23/21 1245 07/23/21 1300  BP:  (!)  182/65 (!) 195/89 (!) 194/96  Pulse:  73 80   Resp:  $Remo'13 17 11  'jfGKU$ Temp: 98 F (36.7 C)     TempSrc: Oral     SpO2:  97% 97%   Weight:      Height:        General exam: Elderly male, moderately built and nourished lying comfortably propped up in bed without distress.  Does not look septic or toxic currently. Respiratory system: Slightly harsh and diminished breath sounds in the bases with occasional basal crackles but otherwise clear to auscultation without wheezing, rhonchi.  No increased work of breathing.  Currently saturating at 96% on 4 L/min nasal cannula oxygen. Cardiovascular system: S1 & S2 heard, RRR. No JVD, murmurs, rubs, gallops or clicks. No pedal edema.  Telemetry personally reviewed: Sinus rhythm. Gastrointestinal system: Abdomen is nondistended, soft and nontender. No organomegaly or masses felt. Normal bowel sounds heard. Central nervous system: Alert and oriented x3. No focal neurological deficits. Extremities: Symmetric 5 x 5 power. Skin: No rashes, lesions or ulcers Psychiatry: Judgement and insight  appear somewhat impaired. Mood & affect appropriate.     Data Reviewed:   I have personally reviewed following labs and imaging studies   CBC: Recent Labs  Lab 07/22/21 1807 07/22/21 1843 07/23/21 0410  WBC 12.5*  --  15.4*  NEUTROABS 10.5*  --   --   HGB 12.8* 12.9* 11.0*  HCT 39.2 38.0* 33.9*  MCV 94.7  --  93.4  PLT 156  --  136*    Basic Metabolic Panel: Recent Labs  Lab 07/22/21 1807 07/22/21 1843 07/23/21 0410  NA 137 140 137  K 4.4 3.8 4.2  CL 102  --  103  CO2 26  --  27  GLUCOSE 104*  --  124*  BUN 20  --  16  CREATININE 1.26*  --  1.09  CALCIUM 9.1  --  8.8*    Liver Function Tests: Recent Labs  Lab 07/22/21 1807 07/23/21 0410  AST 31 22  ALT 27 22  ALKPHOS 37* 28*  BILITOT 0.8 0.6  PROT 7.1 6.3*  ALBUMIN 3.4* 2.8*    CBG: No results for input(s): GLUCAP in the last 168 hours.  Microbiology Studies:   Recent Results (from  the past 240 hour(s))  Resp Panel by RT-PCR (Flu A&B, Covid) Nasopharyngeal Swab     Status: None   Collection Time: 07/22/21  6:16 PM   Specimen: Nasopharyngeal Swab; Nasopharyngeal(NP) swabs in vial transport medium  Result Value Ref Range Status   SARS Coronavirus 2 by RT PCR NEGATIVE NEGATIVE Final    Comment: (NOTE) SARS-CoV-2 target nucleic acids are NOT DETECTED.  The SARS-CoV-2 RNA is generally detectable in upper respiratory specimens during the acute phase of infection. The lowest concentration of SARS-CoV-2 viral copies this assay can detect is 138 copies/mL. A negative result does not preclude SARS-Cov-2 infection and should not be used as the sole basis for treatment or other patient management decisions. A negative result may occur with  improper specimen collection/handling, submission of specimen other than nasopharyngeal swab, presence of viral mutation(s) within the areas targeted by this assay, and inadequate number of viral copies(<138 copies/mL). A negative result must be combined with clinical observations, patient history, and epidemiological information. The expected result is Negative.  Fact Sheet for Patients:  EntrepreneurPulse.com.au  Fact Sheet for Healthcare Providers:  IncredibleEmployment.be  This test is no t yet approved or cleared by the [place] FDA and  has been authorized for detection and/or diagnosis of SARS-CoV-2 by FDA under an Emergency Use Authorization (EUA). This EUA will remain  in effect (meaning this test can be used) for the duration of the COVID-19 declaration under Section 564(b)(1) of the Act, 21 U.S.C.section 360bbb-3(b)(1), unless the authorization is terminated  or revoked sooner.       Influenza A by PCR NEGATIVE NEGATIVE Final   Influenza B by PCR NEGATIVE NEGATIVE Final    Comment: (NOTE) The Xpert Xpress SARS-CoV-2/FLU/RSV plus assay is intended as an aid in the diagnosis of  influenza from Nasopharyngeal swab specimens and should not be used as a sole basis for treatment. Nasal washings and aspirates are unacceptable for Xpert Xpress SARS-CoV-2/FLU/RSV testing.  Fact Sheet for Patients: EntrepreneurPulse.com.au  Fact Sheet for Healthcare Providers: IncredibleEmployment.be  This test is not yet approved or cleared by the [place] FDA and has been authorized for detection and/or diagnosis of SARS-CoV-2 by FDA under an Emergency Use Authorization (EUA). This EUA will remain in effect (meaning this test can be used) for the duration  of the COVID-19 declaration under Section 564(b)(1) of the Act, 21 U.S.C. section 360bbb-3(b)(1), unless the authorization is terminated or revoked.  Performed at [facility], [place] [address], [place], [place] [address]   Culture, blood (Routine x 2)     Status: None (Preliminary result)   Collection Time: 07/22/21  6:38 PM   Specimen: BLOOD  Result Value Ref Range Status   Specimen Description BLOOD LEFT ANTECUBITAL  Final   Special Requests   Final    BOTTLES DRAWN AEROBIC AND ANAEROBIC Blood Culture results may not be optimal due to an inadequate volume of blood received in culture bottles   Culture   Final    NO GROWTH < 12 HOURS Performed at [facility], [place] [address], [place], [place] [address]    Report Status PENDING  Incomplete     Radiology Studies:  CT Head Wo Contrast  Result Date: 07/22/2021 CLINICAL DATA:  Altered mental status EXAM: CT HEAD WITHOUT CONTRAST TECHNIQUE: Contiguous axial images were obtained from the base of the skull through the vertex without intravenous contrast. COMPARISON:  MRI 06/12/2021 FINDINGS: Brain: Normal anatomic configuration of the brain. Parenchymal volume is relatively well preserved given the patient's age. Focal encephalomalacia in keeping with remote cortical infarct noted within the left occipital cortex. No evidence of  acute intracranial hemorrhage or infarct. No abnormal mass effect or midline shift. No abnormal intra or extra-axial mass lesion or fluid collection. Ventricular size is normal. Cerebellum is unremarkable. Vascular: No hyperdense vessel or unexpected calcification. Skull: Normal. Negative for fracture or focal lesion. Sinuses/Orbits: Ocular lenses have been removed the orbits are otherwise unremarkable. Paranasal sinuses are clear save for a small mucous retention cyst within the right frontal sinus. Other: Mastoid air cells and middle ear cavities are clear. IMPRESSION: No acute intracranial abnormality. Small remote left occipital cortical infarct. Electronically Signed   By: Fidela Salisbury M.D.   On: 07/22/2021 20:44   DG Pelvis Portable  Result Date: 07/22/2021 CLINICAL DATA:  Fall. EXAM: PORTABLE PELVIS 1-2 VIEWS COMPARISON:  CT chest abdomen and pelvis 05/30/2021. FINDINGS: There is no evidence of pelvic fracture or diastasis. No pelvic bone lesions are seen. There are vascular calcifications in the soft tissues. IMPRESSION: Negative. Electronically Signed   By: Ronney Asters M.D.   On: 07/22/2021 19:48   DG CHEST PORT 1 VIEW  Result Date: 07/23/2021 CLINICAL DATA:  80 year old male with shortness of breath, weakness, left side deficit, declining mental status. EXAM: PORTABLE CHEST 1 VIEW COMPARISON:  Portable chest 07/22/2021 and earlier. FINDINGS: Portable AP semi upright view at 0411 hours. Mildly lower lung volumes since September. Increased patchy opacity at the medial right lung base. But left lung base ventilation has improved since yesterday. No superimposed pneumothorax, pulmonary edema or pleural effusion. Stable cardiac size and mediastinal contours. Prior CABG. Visualized tracheal air column is within normal limits. Paucity of bowel gas in the upper abdomen. No acute osseous abnormality identified. IMPRESSION: 1. Increasing opacity at the medial right lung base. Cannot exclude aspiration  and/or pneumonia. 2. No other acute cardiopulmonary abnormality. Electronically Signed   By: Genevie Ann M.D.   On: 07/23/2021 04:26   DG Chest Port 1 View  Result Date: 07/22/2021 CLINICAL DATA:  Sepsis EXAM: PORTABLE CHEST 1 VIEW COMPARISON:  06/15/2021 FINDINGS: Unchanged median sternotomy. Mild cardiomegaly with calcific aortic atherosclerosis. Mild pulmonary vascular congestion without overt pulmonary edema. No focal airspace consolidation. Normal pleural spaces. IMPRESSION: Cardiomegaly and mild pulmonary vascular  congestion without overt pulmonary edema. Electronically Signed   By: Ulyses Jarred M.D.   On: 07/22/2021 19:45     Scheduled Meds:    aspirin EC  81 mg Oral QHS   donepezil  5 mg Oral QHS   enoxaparin (LOVENOX) injection  40 mg Subcutaneous QHS   feeding supplement  237 mL Oral BID BM   fludrocortisone  0.1 mg Oral Daily   gabapentin  900 mg Oral TID   levothyroxine  100 mcg Oral QAC breakfast   LORazepam  1 mg Oral TID   pilocarpine  7.5 mg Oral TID   pyridostigmine  60 mg Oral TID   rosuvastatin  20 mg Oral QHS   ticagrelor  90 mg Oral BID    Continuous Infusions:    ceFEPime (MAXIPIME) IV Stopped (07/23/21 0750)   vancomycin       LOS: 0 days     Vernell Leep, MD, [place], [facility]. [facility]    To contact the attending provider between 7A-7P or the covering provider during after hours 7P-7A, please log into the web site [email or web address] and access using universal [place] password for that web site. If you do not have the password, please call the hospital operator.  07/23/2021, 3:09 PM

## 2021-07-23 NOTE — Progress Notes (Signed)
[facility]) Hospital Liaison note:  This patient is currently enrolled in [facility] outpatient-based Palliative Care. Will continue to follow for disposition.  Please call with any outpatient palliative questions or concerns.  Thank you, Lorelee Market, LPN [facility] Liaison [phone]

## 2021-07-23 NOTE — ED Notes (Signed)
Rotated pt in bed.

## 2021-07-23 NOTE — ED Notes (Signed)
Upon entering the room pt was half way out the bed with oxygen removed stating he has to go home. I redirected him to lay down however he is still adamant about leaving. I called his wife who stated that it is his dementia. She recommend contacting MD to prescribe some medication that will sedate him. Notified MD. Pt has mask back on and O2 99%

## 2021-07-23 NOTE — ED Notes (Signed)
NRB removed with 6L [place] applied. Sats remain at 90%. RT at bedside to try a salter nasal canula. Pt is in no acute distress. Dr. Hal Hope informed of the patient's increased oxygen demand.

## 2021-07-24 DIAGNOSIS — R6510 Systemic inflammatory response syndrome (SIRS) of non-infectious origin without acute organ dysfunction: Secondary | ICD-10-CM | POA: Diagnosis not present

## 2021-07-24 LAB — CBC
HCT: 39.1 % (ref 39.0–52.0)
Hemoglobin: 13 g/dL (ref 13.0–17.0)
MCH: 30.4 pg (ref 26.0–34.0)
MCHC: 33.2 g/dL (ref 30.0–36.0)
MCV: 91.6 fL (ref 80.0–100.0)
Platelets: 153 10*3/uL (ref 150–400)
RBC: 4.27 MIL/uL (ref 4.22–5.81)
RDW: 15.4 % (ref 11.5–15.5)
WBC: 10.7 10*3/uL — ABNORMAL HIGH (ref 4.0–10.5)
nRBC: 0 % (ref 0.0–0.2)

## 2021-07-24 LAB — MRSA NEXT GEN BY PCR, NASAL: MRSA by PCR Next Gen: NOT DETECTED

## 2021-07-24 LAB — PROCALCITONIN: Procalcitonin: 1.61 ng/mL

## 2021-07-24 MED ORDER — SODIUM CHLORIDE 3 % IN NEBU
4.0000 mL | INHALATION_SOLUTION | Freq: Every day | RESPIRATORY_TRACT | Status: AC
  Administered 2021-07-25 – 2021-07-26 (×2): 4 mL via RESPIRATORY_TRACT
  Filled 2021-07-24 (×3): qty 4

## 2021-07-24 MED ORDER — AMLODIPINE BESYLATE 5 MG PO TABS
5.0000 mg | ORAL_TABLET | Freq: Every day | ORAL | Status: DC
  Administered 2021-07-24: 5 mg via ORAL
  Filled 2021-07-24: qty 1

## 2021-07-24 MED ORDER — GUAIFENESIN ER 600 MG PO TB12
1200.0000 mg | ORAL_TABLET | Freq: Two times a day (BID) | ORAL | Status: DC
  Administered 2021-07-24 – 2021-07-27 (×6): 1200 mg via ORAL
  Filled 2021-07-24 (×6): qty 2

## 2021-07-24 MED ORDER — SODIUM CHLORIDE 0.9 % IV BOLUS: 1000.0000 mL | Freq: Once | INTRAVENOUS | Status: DC

## 2021-07-24 MED ORDER — HYDRALAZINE HCL 20 MG/ML IJ SOLN
5.0000 mg | Freq: Once | INTRAMUSCULAR | Status: AC
  Administered 2021-07-24: 5 mg via INTRAVENOUS
  Filled 2021-07-24: qty 1

## 2021-07-24 NOTE — Progress Notes (Signed)
SLP Cancellation Note  Patient Details Name: George Bradley MRN: [id number] DOB: 1940/12/14   Cancelled treatment:       Reason Eval/Treat Not Completed: SLP screened, no needs identified.  The pts wife and I agree that there is no benefit to further SLP evaluation or treatment at this time. They are aware of pts chronic dysphagia and recurrent aspiration pneumonia risk. Therapeutic interventions have been and will continue to be futile given pts dementia and inability to carry over strategies.  They reiterated the ongoing decision to avoid thickened liquids given pts displeasure for them. Will sign off.     Beauregard Jarrells, Katherene Ponto 07/24/2021, 1:13 PM

## 2021-07-24 NOTE — Progress Notes (Signed)
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 ED on 07/22/2021 due to fall at home after working in his yard for several hours followed by profound weakness, confusion, multiple episodes of nonbloody emesis, cough with intermittent yellow/Sol sputum and chest pain with coughing.  Admitted for sepsis due to suspected aspiration pneumonia.  07/24/2021: Seen and examined at his bedside.  Admits to having difficulty swallowing solids at times.  Seen by speech therapist, signed off.  Assessment/Plan: Principal Problem:   SIRS (systemic inflammatory response syndrome) (HCC) Active Problems:   History of tongue cancer   Orthostatic hypotension   Hypothyroidism   MGUS (monoclonal gammopathy of unknown significance)   Dementia (HCC)  Sepsis secondary to aspiration pneumonia Presented with fever T-max 102.1, heart rate 130, WBC 15, chest x-ray personally reviewed showing pulmonary infiltrates in the medial right lung base. Received IV vancomycin and cefepime, MRSA screen negative, DC IV vancomycin on 07/24/2021. Continue cefepime. Continue pulmonary toilet  Acute hypoxic respiratory failure secondary to above Maintain O2 saturation greater than 92%.  He is currently on 4 L nasal cannula. 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  Polyneuropathy Continue gabapentin  Chronic anxiety disorder Continue p.o. Ativan   Code Status: DNR  Family Communication: None at bedside  Disposition Plan: Likely will discharge to home   Consultants: None  Procedures: None  Antimicrobials: Cefepime IV vancomycin, DC'd on 07/24/2021.  DVT prophylaxis: Subcu Lovenox daily  Status is: Inpatient  Patient will require at least 2 midnights for further evaluation and treatment of present condition.        Objective: Vitals:   07/24/21 0449 07/24/21 0750 07/24/21 1312 07/24/21 1456  BP: (!) 175/84 (!) 173/79  (!) 162/99  Pulse: 88 93    Resp: 16 16    Temp: 98.6 F (37 C) 98.5 F (36.9 C) 98.5 F (36.9 C)   TempSrc: Oral Oral Oral   SpO2: 93%     Weight: 73.6 kg     Height: 6\' 1"  (1.854 m)       Intake/Output Summary (Last 24 hours) at 07/24/2021 1742 Last data filed at 07/24/2021 1500 Gross per 24 hour  Intake 398.07 ml  Output 400 ml  Net -1.93 ml   Filed Weights   07/22/21 2306 07/24/21 0345 07/24/21 0449  Weight: 77.1 kg 73.6 kg 73.6 kg    Exam:  General: 80 y.o. year-old male well developed well nourished in no acute distress.  Alert and oriented x3.  He answered my orientation questions appropriately. Cardiovascular: Regular rate and rhythm with no rubs or gallops.  No thyromegaly or JVD noted.   Respiratory: Diffuse rales bilaterally.  No wheezing noted.  Poor inspiratory effort.   Abdomen: Soft nontender nondistended with normal bowel sounds x4 quadrants. Musculoskeletal: No lower  extremity edema. 2/4 pulses in all 4 extremities. Skin: No ulcerative lesions noted or rashes, Psychiatry: Mood is appropriate for condition and setting   Data  Reviewed: CBC: Recent Labs  Lab 07/22/21 1807 07/22/21 1843 07/23/21 0410 07/24/21 0518  WBC 12.5*  --  15.4* 10.7*  NEUTROABS 10.5*  --   --   --   HGB 12.8* 12.9* 11.0* 13.0  HCT 39.2 38.0* 33.9* 39.1  MCV 94.7  --  93.4 91.6  PLT 156  --  136* 627   Basic Metabolic Panel: Recent Labs  Lab 07/22/21 1807 07/22/21 1843 07/23/21 0410  NA 137 140 137  K 4.4 3.8 4.2  CL 102  --  103  CO2 26  --  27  GLUCOSE 104*  --  124*  BUN 20  --  16  CREATININE 1.26*  --  1.09  CALCIUM 9.1  --  8.8*   GFR: Estimated Creatinine Clearance: 56.3 mL/min (by C-G formula based on SCr of 1.09 mg/dL). Liver Function Tests: Recent Labs  Lab 07/22/21 1807 07/23/21 0410  AST 31 22  ALT 27 22  ALKPHOS 37* 28*  BILITOT 0.8 0.6  PROT 7.1 6.3*  ALBUMIN 3.4* 2.8*   No results for input(s): LIPASE, AMYLASE in the last 168 hours. No results for input(s): AMMONIA in the last 168 hours. Coagulation Profile: Recent Labs  Lab 07/22/21 1950  INR 1.1   Cardiac Enzymes: No results for input(s): CKTOTAL, CKMB, CKMBINDEX, TROPONINI in the last 168 hours. BNP (last 3 results) No results for input(s): PROBNP in the last 8760 hours. HbA1C: No results for input(s): HGBA1C in the last 72 hours. CBG: No results for input(s): GLUCAP in the last 168 hours. Lipid Profile: No results for input(s): CHOL, HDL, LDLCALC, TRIG, CHOLHDL, LDLDIRECT in the last 72 hours. Thyroid Function Tests: No results for input(s): TSH, T4TOTAL, FREET4, T3FREE, THYROIDAB in the last 72 hours. Anemia Panel: No results for input(s): VITAMINB12, FOLATE, FERRITIN, TIBC, IRON, RETICCTPCT in the last 72 hours. Urine analysis:    Component Value Date/Time   COLORURINE YELLOW 07/22/2021 1807   APPEARANCEUR CLEAR 07/22/2021 1807   LABSPEC 1.011 07/22/2021 1807   LABSPEC 1.005 06/25/2011 1502   PHURINE 7.0 07/22/2021 1807   GLUCOSEU NEGATIVE 07/22/2021 1807   HGBUR NEGATIVE 07/22/2021 1807   BILIRUBINUR NEGATIVE 07/22/2021  1807   BILIRUBINUR Negative 06/25/2011 1502   KETONESUR 5 (A) 07/22/2021 1807   PROTEINUR NEGATIVE 07/22/2021 1807   UROBILINOGEN 0.2 10/09/2011 1634   NITRITE NEGATIVE 07/22/2021 1807   LEUKOCYTESUR NEGATIVE 07/22/2021 1807   LEUKOCYTESUR Negative 06/25/2011 1502   Sepsis Labs: @LABRCNTIP (procalcitonin:4,lacticidven:4)  ) Recent Results (from the past 240 hour(s))  Resp Panel by RT-PCR (Flu A&B, Covid) Nasopharyngeal Swab     Status: None   Collection Time: 07/22/21  6:16 PM   Specimen: Nasopharyngeal Swab; Nasopharyngeal(NP) swabs in vial transport medium  Result Value Ref Range Status   SARS Coronavirus 2 by RT PCR NEGATIVE NEGATIVE Final    Comment: (NOTE) SARS-CoV-2 target nucleic acids are NOT DETECTED.  The SARS-CoV-2 RNA is generally detectable in upper respiratory specimens during the acute phase of infection. The lowest concentration of SARS-CoV-2 viral copies this assay can detect is 138 copies/mL. A negative result does not preclude SARS-Cov-2 infection and should not be used as the sole basis for treatment or other patient management decisions. A negative result may occur with  improper specimen collection/handling, submission of specimen other than nasopharyngeal swab, presence of viral mutation(s)  within the areas targeted by this assay, and inadequate number of viral copies(<138 copies/mL). A negative result must be combined with clinical observations, patient history, and epidemiological information. The expected result is Negative.  Fact Sheet for Patients:  EntrepreneurPulse.com.au  Fact Sheet for Healthcare Providers:  IncredibleEmployment.be  This test is no t yet approved or cleared by the [place] FDA and  has been authorized for detection and/or diagnosis of SARS-CoV-2 by FDA under an Emergency Use Authorization (EUA). This EUA will remain  in effect (meaning this test can be used) for the duration of  the COVID-19 declaration under Section 564(b)(1) of the Act, 21 U.S.C.section 360bbb-3(b)(1), unless the authorization is terminated  or revoked sooner.       Influenza A by PCR NEGATIVE NEGATIVE Final   Influenza B by PCR NEGATIVE NEGATIVE Final    Comment: (NOTE) The Xpert Xpress SARS-CoV-2/FLU/RSV plus assay is intended as an aid in the diagnosis of influenza from Nasopharyngeal swab specimens and should not be used as a sole basis for treatment. Nasal washings and aspirates are unacceptable for Xpert Xpress SARS-CoV-2/FLU/RSV testing.  Fact Sheet for Patients: EntrepreneurPulse.com.au  Fact Sheet for Healthcare Providers: IncredibleEmployment.be  This test is not yet approved or cleared by the [place] FDA and has been authorized for detection and/or diagnosis of SARS-CoV-2 by FDA under an Emergency Use Authorization (EUA). This EUA will remain in effect (meaning this test can be used) for the duration of the COVID-19 declaration under Section 564(b)(1) of the Act, 21 U.S.C. section 360bbb-3(b)(1), unless the authorization is terminated or revoked.  Performed at [facility], [place] [address], [place], [place] [address]   Culture, blood (Routine x 2)     Status: None (Preliminary result)   Collection Time: 07/22/21  6:38 PM   Specimen: BLOOD  Result Value Ref Range Status   Specimen Description BLOOD LEFT ANTECUBITAL  Final   Special Requests   Final    BOTTLES DRAWN AEROBIC AND ANAEROBIC Blood Culture results may not be optimal due to an inadequate volume of blood received in culture bottles   Culture   Final    NO GROWTH 2 DAYS Performed at [facility], [place] [address], [place], [place] [address]    Report Status PENDING  Incomplete  Urine Culture     Status: None   Collection Time: 07/22/21  8:38 PM   Specimen: In/Out Cath Urine  Result Value Ref Range Status   Specimen Description IN/OUT CATH URINE  Final    Special Requests NONE  Final   Culture   Final    NO GROWTH Performed at [facility], [place] [address], [place], [place] [address]    Report Status 07/23/2021 FINAL  Final  MRSA Next Gen by PCR, Nasal     Status: None   Collection Time: 07/24/21 10:36 AM   Specimen: Nasal Mucosa; Nasal Swab  Result Value Ref Range Status   MRSA by PCR Next Gen NOT DETECTED NOT DETECTED Final    Comment: (NOTE) The GeneXpert MRSA Assay (FDA approved for NASAL specimens only), is one component of a comprehensive MRSA colonization surveillance program. It is not intended to diagnose MRSA infection nor to guide or monitor treatment for MRSA infections. Test performance is not FDA approved in patients less than 87 years old. Performed at [facility], [place] [address], [place],  [address]       Studies: No results found.  Scheduled Meds:  amLODipine  5 mg Oral Daily   aspirin EC  81 mg Oral QHS   donepezil  5 mg Oral QHS   enoxaparin (LOVENOX) injection  40 mg Subcutaneous QHS   feeding supplement  237 mL Oral BID BM   fludrocortisone  0.1 mg Oral Daily   gabapentin  900 mg Oral TID   levothyroxine  100 mcg Oral QAC breakfast   LORazepam  1 mg Oral TID   pilocarpine  7.5 mg Oral TID   pyridostigmine  60 mg Oral TID   rosuvastatin  20 mg Oral QHS   ticagrelor  90 mg Oral BID    Continuous Infusions:  ceFEPime (MAXIPIME) IV 2 g (07/24/21 0645)     LOS: 1 day     Kayleen Memos, MD [facility] Pager [phone]  If 7PM-7AM, please contact night-coverage [email or web address] Password [facility] 07/24/2021, 5:42 PM

## 2021-07-24 NOTE — Progress Notes (Signed)
PT Cancellation Note  Patient Details Name: RAHMIR BEEVER MRN: [id number] DOB: 1941-07-08   Cancelled Treatment:    Reason Eval/Treat Not Completed: Fatigue/lethargy limiting ability to participate. RN and family member reporting pt has been very lethargic this date, requesting PT to return later as time permits.   Moishe Spice, PT, DPT Acute Rehabilitation Services  Pager: [phone] Office: [place] 07/24/2021, 1:40 PM

## 2021-07-24 NOTE — Progress Notes (Signed)
Paged MD in regard to pt's elevated BP. Notified her of multiple readings in 683F-290S systolic. Dr. Irene Pap gave verbal order to give 5mg  IV hydralazine. Hydralazine was pushed and pt's BP stabilized in 111B systolic. Will continue to monitor.

## 2021-07-24 NOTE — Progress Notes (Signed)
MD paged about Pt elevated BP. Multiple readings in 872J-587G systolic. After I readjusted a new BP cuff I got a reading of 159/81 (101). MD ordered norvasc daily which was given at 1552 and ordered to just monitor pt for now. She states she is fine with systolics in 761O but notify her again if it remains in 170s-180s after cuff readjustment. Will continue to monitor pt and pass on information to night shift.

## 2021-07-24 NOTE — Evaluation (Signed)
Physical Therapy Evaluation Patient Details Name: George Bradley MRN: [id number] DOB: [birth year] Today's Date: 07/24/2021  History of Present Illness  Pt is an 80 y.o. male who presented 07/22/21 s/p fall with generalized weakness, AMS, multiple episodes of nonbloody emesis, cough with intermittent yellow/[place] sputum and chest pain with coughing. Admitted for sepsis due to suspected aspiration pneumonia. CT of head revealed no acute intracranial abnormality, small remote left occipital  cortical infarct. 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 PTA, he was performing functional mobility without AD or assistance, but has had many recent falls. Pt lives with his wife, who is a retired acute PT and provides him with 24/7 assistance. Pt lives on the main level of his house with 1 STE. Pt has a hx of residual L sided weakness from a recent CVA, however, his weakness on his L side appears pretty significant this date. Unsure whether this is really his baseline. Currently, pt is displaying deficits in memory, awareness, and problem-solving along with deficits in gross strength (L>R), coordination, balance, and activity tolerance placing him at high risk for subsequent falls. Pt required modA for bed mobility and basic transfers and up to maxA to ambulate up to ~5 ft with a RW this date. He did display a moment in which his entire body began to bounce/shake while standing, thus was returned to sit with maxA and pt immediately verbally responded to PT once sitting, RN notified. Due to pt's  significant decline in functional status, increase in caregiver burden, and strong desire by pt and his family to improve, recommending inpatient rehab if applicable. Will continue to follow acutely.       Recommendations for follow up therapy are one component of a multi-disciplinary discharge planning process, led by the attending physician.  Recommendations may be updated based on patient status, additional functional criteria and insurance authorization.  Follow Up Recommendations Acute inpatient rehab (3hours/day)    Assistance Recommended at Discharge Frequent or constant Supervision/Assistance  Functional Status Assessment Patient has had a recent decline in their functional status and demonstrates the ability to make significant improvements in function in a reasonable and predictable amount of time.  Equipment Recommendations  3in1 (PT)    Recommendations for Other Services Rehab consult;OT consult     Precautions / Restrictions Precautions Precautions: Fall;Other (comment) Precaution Comments: monitor vitals Restrictions Weight Bearing Restrictions: No      Mobility  Bed Mobility Overal bed mobility: Needs Assistance Bed Mobility: Sit to Supine       Sit to supine: Mod assist   General bed mobility comments: Pt trying to return to supine at end of bed, needing modA to direct trunk and legs as a result and assist pt to transition superiorly in bed with use of bed in trendelenburg position.    Transfers Overall transfer level: Needs assistance Equipment used: Rolling walker (2 wheels) Transfers: Sit to/from Stand Sit to Stand: Mod assist           General transfer comment: Pt initiates transfer to stand with minA, but needs modA to complete to stand fully upright and gain balance.    Ambulation/Gait Ambulation/Gait assistance: Mod assist;Max  assist Gait Distance (Feet): 5 Feet Assistive device: Rolling walker (2 wheels) Gait Pattern/deviations: Step-to  pattern;Decreased step length - left;Decreased stride length;Narrow base of support;Knees buckling;Trunk flexed Gait velocity: reduced Gait velocity interpretation: <1.31 ft/sec, indicative of household ambulator General Gait Details: Pt with slow, unsteady gait with decreased L step length step-to gait pattern. Pt with intermittent narrow placement of R foot proximal and anterior to his L along with pushing his RW distally anteriorly and to the R. Pt requiring modA to step anteriorly and maxA to step posteriorly, unable to follow commands without significant LOB to step poisteriorly to turn. Towards end of standing bout pt began to bounce/buckle at knees repeatedly, almost like his entire body was shaking, returned pt to sit on EOB with maxA. While pt was not speaking during shaking spell, he did almost immediately once sitting. Notified RN.  Stairs            Wheelchair Mobility    Modified Rankin (Stroke Patients Only) Modified Rankin (Stroke Patients Only) Pre-Morbid Rankin Score: Moderate disability Modified Rankin: Moderately severe disability     Balance Overall balance assessment: Needs assistance Sitting-balance support: Feet supported;Bilateral upper extremity supported Sitting balance-Leahy Scale: Poor Sitting balance - Comments: UE support and min-maxA to prevent posterior LOB as pt was actively leaning back to lie down. Postural control: Posterior lean Standing balance support: Bilateral upper extremity supported Standing balance-Leahy Scale: Poor Standing balance comment: Reliant on bil UE support on RW and mod-maxA for standing mobility.                             Pertinent Vitals/Pain Pain Assessment: No/denies pain    Home Living Family/patient expects to be discharged to:: Private residence Living Arrangements: Spouse/significant other Available Help at Discharge: Family;Available 24 hours/day Type of Home: House Home Access: Stairs to  enter Entrance Stairs-Rails: None Entrance Stairs-Number of Steps: 1   Home Layout: Multi-level;Able to live on main level with bedroom/bathroom Home Equipment: Rolling Walker (2 wheels);Cane - quad;Grab bars - tub/shower;Hospital bed;Transport chair Additional Comments: Wife is a retired acute PT. Wife has call button along with baby monitor and baby gates on stairs to assist in monitoring pt's safety.    Prior Function Prior Level of Function : Needs assist;History of Falls (last six months)             Mobility Comments: Pt with many falls recently. While he owns AD, he does not use it. Likes to do yard work.       Hand Dominance   Dominant Hand: Right    Extremity/Trunk Assessment   Upper Extremity Assessment Upper Extremity Assessment: Defer to OT evaluation    Lower Extremity Assessment Lower Extremity Assessment: LLE deficits/detail;Generalized weakness LLE Deficits / Details: Noted functionally, increased weakness and incoordination compared to R side, hx of CVA with residual L sided weakness though so unsure if this is his baseline. LLE Sensation: decreased proprioception LLE Coordination: decreased fine motor;decreased gross motor    Cervical / Trunk Assessment Cervical / Trunk Assessment: Kyphotic  Communication   Communication: No difficulties  Cognition Arousal/Alertness: Lethargic Behavior During Therapy: Impulsive Overall Cognitive Status: History of cognitive impairments - at baseline                                 General Comments: Pt with hx of dementia. However, per family, he is more  lethargic and speaking less than normal. Pt with deficits in memory, needing simple cues to sequence tasks with inconsistent results. Poor awareness into his deficits.        General Comments General comments (skin integrity, edema, etc.): pt cold and clammy feeling, BP `55/96 after supine for period of time at end of session, donned [place] at 4L end of  session due to SpO2 at 89% on RA and pt sleeping    Exercises     Assessment/Plan    PT Assessment Patient needs continued PT services  PT Problem List Decreased strength;Decreased balance;Decreased activity tolerance;Decreased mobility;Decreased coordination;Decreased cognition;Decreased knowledge of use of DME;Decreased safety awareness;Cardiopulmonary status limiting activity;Impaired sensation       PT Treatment Interventions DME instruction;Gait training;Stair training;Functional mobility training;Therapeutic activities;Therapeutic exercise;Neuromuscular re-education;Balance training;Cognitive remediation;Patient/family education;Wheelchair mobility training    PT Goals (Current goals can be found in the Care Plan section)  Acute Rehab PT Goals Patient Stated Goal: to try to walk PT Goal Formulation: With patient/family Time For Goal Achievement: 08/07/21 Potential to Achieve Goals: Fair    Frequency Min 3X/week   Barriers to discharge        Co-evaluation               AM-PAC PT "6 Clicks" Mobility  Outcome Measure Help needed turning from your back to your side while in a flat bed without using bedrails?: A Lot Help needed moving from lying on your back to sitting on the side of a flat bed without using bedrails?: A Lot Help needed moving to and from a bed to a chair (including a wheelchair)?: A Lot Help needed standing up from a chair using your arms (e.g., wheelchair or bedside chair)?: A Lot Help needed to walk in hospital room?: A Lot Help needed climbing 3-5 steps with a railing? : Total 6 Click Score: 11    End of Session Equipment Utilized During Treatment: Gait belt Activity Tolerance: Patient limited by fatigue Patient left: in bed;with call bell/phone within reach;with bed alarm set Nurse Communication: Mobility status;Other (comment) (shaking bout; cold and clammy) PT Visit Diagnosis: Unsteadiness on feet (R26.81);Other abnormalities of gait and  mobility (R26.89);Muscle weakness (generalized) (M62.81);Repeated falls (R29.6);History of falling (Z91.81);Difficulty in walking, not elsewhere classified (R26.2)    Time: 2122-4825 PT Time Calculation (min) (ACUTE ONLY): 35 min   Charges:   PT Evaluation $PT Eval Moderate Complexity: 1 Mod PT Treatments $Therapeutic Activity: 8-22 mins        Moishe Spice, PT, DPT Acute Rehabilitation Services  Pager: [phone] Office: [phone]   Orvan Falconer 07/24/2021, 5:41 PM

## 2021-07-25 DIAGNOSIS — R6510 Systemic inflammatory response syndrome (SIRS) of non-infectious origin without acute organ dysfunction: Secondary | ICD-10-CM | POA: Diagnosis not present

## 2021-07-25 DIAGNOSIS — L8990 Pressure ulcer of unspecified site, unspecified stage: Secondary | ICD-10-CM | POA: Insufficient documentation

## 2021-07-25 LAB — BASIC METABOLIC PANEL
Anion gap: 8 (ref 5–15)
BUN: 21 mg/dL (ref 8–23)
CO2: 28 mmol/L (ref 22–32)
Calcium: 9.2 mg/dL (ref 8.9–10.3)
Chloride: 103 mmol/L (ref 98–111)
Creatinine, Ser: 1.11 mg/dL (ref 0.61–1.24)
GFR, Estimated: >60 mL/min (ref >60)
Glucose, Bld: 100 mg/dL — ABNORMAL HIGH (ref 70–99)
Potassium: 3.5 mmol/L (ref 3.5–5.1)
Sodium: 139 mmol/L (ref 135–145)

## 2021-07-25 LAB — PROCALCITONIN: Procalcitonin: 1.53 ng/mL

## 2021-07-25 MED ORDER — METOPROLOL TARTRATE 5 MG/5ML IV SOLN
2.5000 mg | Freq: Four times a day (QID) | INTRAVENOUS | Status: DC | PRN
  Administered 2021-07-26: 2.5 mg via INTRAVENOUS
  Filled 2021-07-25: qty 5

## 2021-07-25 NOTE — Progress Notes (Signed)
Notified Dr. Irene Pap of pts elevated BP (154/92) and HR (117). Pt MEWS turned yellow. Yellow mews protocol implemented. Verbal order from Dr. [place] Crane to "MONITOR for now". Will continue to monitor pt.

## 2021-07-25 NOTE — Progress Notes (Signed)
Inpatient Rehab Admissions Coordinator Note:   Per therapy recommendations patient was screened for CIR candidacy by Michel Santee, PT. At this time, pt appears to be a potential candidate for CIR. I will place an order for rehab consult for full assessment, per our protocol.  Please contact me any with questions.Shann Medal, PT, DPT [phone] 07/25/21 11:44 AM

## 2021-07-25 NOTE — Progress Notes (Signed)
OT Cancellation Note  Patient Details Name: BALEY LORIMER MRN: [id number] DOB: [birth year]   Cancelled Treatment:    Reason Eval/Treat Not Completed: Fatigue/lethargy limiting ability to participate  Malka So 07/25/2021, 1:50 PM Nestor Lewandowsky, OTR/L Acute Rehabilitation Services Pager: [phone] Office: [phone]

## 2021-07-25 NOTE — Progress Notes (Signed)
TRH night cross cover note:  I was notified by RN of patient's blood pressure 68/53 with HR in low 90's, with patient noted to be asymptomatic in bedside chair at the time.  This is relative to hypertensive blood pressures earlier in the day, prompting dose of IV hydralazine around 1515 on 07/24/21 in addition to Norvasc. I held subsequent Norvasc, asked for manual blood pressure correlation, and ordered NS bolus.  Following repositioning, repeat blood pressure prior to administration of NS bolus noted to be 116/72.  I subsequently discontinued order for this bolus.     Babs Bertin, DO Hospitalist
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
## 2021-07-25 NOTE — Progress Notes (Signed)
Physical Therapy Treatment Patient Details Name: George Bradley MRN: [id number] DOB: Mar 13, 1941 Today's Date: 07/25/2021   History of Present Illness Pt is an 80 y.o. male who presented 07/22/21 s/p fall with generalized weakness, AMS, multiple episodes of nonbloody emesis, cough with intermittent yellow/[place] sputum and chest pain with coughing. Admitted for sepsis due to suspected aspiration pneumonia. CT of head revealed no acute intracranial abnormality, small remote left occipital  cortical infarct. 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 However, pt continues to display muscular fatigue and poor activity tolerance as he progressed in gait distance and gait bout repetition. Pt required increased assistance and displayed decreased L foot clearance and step length as he fatigued, impacting his balance. He remains at high risk for falls. Discussed options with pt's wife for follow-up rehab and potentially considering a memory care facility eventually if she can no longer provide the level of care he needs at home. She was very receptive to suggestions. Will continue to follow acutely. Continuing to recommend inpatient rehab at this time, but if pt continues to make good progress he may be able to discharge home with HHPT.   Recommendations for follow up therapy are one component of a multi-disciplinary discharge planning process, led by the attending physician.  Recommendations may be updated based  on patient status, additional functional criteria and insurance authorization.  Follow Up Recommendations  Acute inpatient rehab (3hours/day) (pending progression)     Assistance Recommended at Discharge Frequent or constant Supervision/Assistance  Equipment Recommendations  3in1 (PT)    Recommendations for Other Services Rehab consult     Precautions / Restrictions Precautions Precautions: Fall;Other (comment) Precaution Comments: monitor vitals Restrictions Weight Bearing Restrictions: No     Mobility  Bed Mobility Overal bed mobility: Needs Assistance Bed Mobility: Supine to Sit       Sit to supine: Min guard;HOB elevated   General bed mobility comments: Extra time and cues to manage legs off L EOB and place hands on L bed rail to pull to ascend trunk.    Transfers Overall transfer level: Needs assistance Equipment used: Rolling walker (2 wheels) Transfers: Sit to/from Stand Sit to Stand: Min assist;+2 safety/equipment           General transfer comment: Cues provided to scoot to edge of seat and push up from sitting surface, extra time and repetition needed to complete, but minA to power up and steady to stand from various surfaces.    Ambulation/Gait Ambulation/Gait assistance: Mod assist;Min assist;+2 safety/equipment Gait Distance (Feet): 50 Feet (x5 bouts of ~5 ft > ~50 ft > ~20 ft > ~5 ft > ~5 ft) Assistive device: Rolling walker (2 wheels) Gait Pattern/deviations: Step-to pattern;Decreased step length - left;Decreased stride length;Trunk flexed;Step-through pattern Gait velocity: reduced Gait velocity interpretation: <1.31 ft/sec, indicative of household ambulator General Gait Details: Pt with improved step-through gait initially, but as fatigued he displayed more of a step-to gait pattern with his L foot. Provided tactile and verbal cues to increase L step length, improve upright posture, and improve positioning of body to  RW as he tends to push it  distally and laterally. MinA for balance initially but modA as he fatigued and his hips and knees flexed even more. During final bout, pt required heavy modA to take steps holding onto therapist from commode to recliner.   Stairs             Wheelchair Mobility    Modified Rankin (Stroke Patients Only) Modified Rankin (Stroke Patients Only) Pre-Morbid Rankin Score: Moderate disability Modified Rankin: Moderately severe disability     Balance Overall balance assessment: Needs assistance Sitting-balance support: Feet supported;Bilateral upper extremity supported Sitting balance-Leahy Scale: Poor Sitting balance - Comments: Min guard assist with UE support to sit statically EOB.   Standing balance support: No upper extremity supported;Bilateral upper extremity supported;During functional activity Standing balance-Leahy Scale: Fair Standing balance comment: Able to stand statically briefly without UE support but requires UE support for mobility.                            Cognition Arousal/Alertness: Awake/alert Behavior During Therapy: Flat affect Overall Cognitive Status: History of cognitive impairments - at baseline                                 General Comments: Pt with hx of dementia. Pt displays deficits in sequencing capabilities and memory, needing repeated simple multi-modal cues to prepare for all functional mobility, poor carryover of cues.        Exercises Other Exercises Other Exercises: Gentle soft tissue mobilization and stretch to R upper trap musculature.    General Comments General comments (skin integrity, edema, etc.): HR max of 130, SpO2 >/= 87% on RA (poor waveforms noted), BP stable per RN manual reading start of session      Pertinent Vitals/Pain Pain Assessment: Faces Faces Pain Scale: Hurts a little bit Pain Location: neck Pain Descriptors / Indicators: Discomfort;Grimacing Pain Intervention(s): Limited activity  within patient's tolerance;Monitored during session;Repositioned;Utilized relaxation techniques (soft tissue mobilization/stretching)    Home Living                          Prior Function            PT Goals (current goals can now be found in the care plan section) Acute Rehab PT Goals Patient Stated Goal: to improve PT Goal Formulation: With patient/family Time For Goal Achievement: 08/07/21 Potential to Achieve Goals: Fair Progress towards PT goals: Progressing toward goals    Frequency    Min 3X/week      PT Plan Current plan remains appropriate    Co-evaluation              AM-PAC PT "6 Clicks" Mobility   Outcome Measure  Help needed turning from your back to your side while in a flat bed without using bedrails?: A Little Help needed moving from lying on your back to sitting on the side of a flat bed without using bedrails?: A Little Help needed moving to and from a bed to a chair (including a wheelchair)?: A Little Help needed standing up from a chair using your arms (e.g., wheelchair or bedside chair)?: A Little Help needed to walk in hospital room?: A Lot Help needed climbing 3-5 steps with a railing? : Total 6 Click Score: 15    End of Session Equipment Utilized During Treatment: Gait belt  Activity Tolerance: Patient limited by fatigue Patient left: with call bell/phone within reach;in chair;with chair alarm set Nurse Communication: Mobility status PT Visit Diagnosis: Unsteadiness on feet (R26.81);Other abnormalities of gait and mobility (R26.89);Muscle weakness (generalized) (M62.81);Repeated falls (R29.6);History of falling (Z91.81);Difficulty in walking, not elsewhere classified (R26.2)     Time: 0071-2197 PT Time Calculation (min) (ACUTE ONLY): 38 min  Charges:  $Gait Training: 23-37 mins $Therapeutic Activity: 8-22 mins                     Moishe Spice, PT, DPT Acute Rehabilitation Services  Pager: [phone] Office:  [place] 07/25/2021, 10:53 AM

## 2021-07-25 NOTE — Progress Notes (Signed)
RT instructed patient on the use of a flutter valve. Patient was able to demonstrate back good technique. 

## 2021-07-25 NOTE — Progress Notes (Signed)
Inpatient Rehabilitation Admissions Coordinator   I met at bedside with patient and his daughter as well as spoke with his wife by phone. We discussed goals and expectations of a possible [facility] admit.  I feel he could benefit from CIR admit pending bed availability when he medically ready and they are in agreement.    , RN, MSN Rehab Admissions Coordinator [phone] 07/25/2021 4:51 PM  

## 2021-07-25 NOTE — Progress Notes (Signed)
   07/25/21 0915  Assess: MEWS Score  BP (!) 154/92  Pulse Rate (!) 117  ECG Heart Rate (!) 112  Resp 20  SpO2 (!) 89 %  Assess: MEWS Score  MEWS Temp 0  MEWS Systolic 0  MEWS Pulse 2  MEWS RR 0  MEWS LOC 0  MEWS Score 2  MEWS Score Color Yellow  Assess: if the MEWS score is Yellow or Red  Were vital signs taken at a resting state? Yes  Focused Assessment Change from prior assessment (see assessment flowsheet)  Early Detection of Sepsis Score *See Row Information* Low  MEWS guidelines implemented *See Row Information* Yes  Treat  MEWS Interventions Escalated (See documentation below)  Pain Scale 0-10  Pain Score 0  Patients Stated Pain Goal 0  Take Vital Signs  Increase Vital Sign Frequency  Yellow: Q 2hr X 2 then Q 4hr X 2, if remains yellow, continue Q 4hrs  Escalate  MEWS: Escalate Yellow: discuss with charge nurse/RN and consider discussing with provider and RRT  Notify: Charge Nurse/RN  Name of Charge Nurse/RN Notified Wallene Dales, RN  Date Charge Nurse/RN Notified 07/25/21  Time Charge Nurse/RN Notified 0930  Document  Patient Outcome Other (Comment) (pt remains stable)  Progress note created (see row info) Yes

## 2021-07-25 NOTE — Progress Notes (Addendum)
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  Admitted for sepsis due to aspiration pneumonia.  Seen by speech therapist, has chronic dysphagia.  Patient has declined a dysphagia diet.  07/25/2021: Seen and examined at his bedside.  Hypotensive overnight likely iatrogenic due to labile blood pressure.  He denies any chest pain or dizziness.  States his cough is about the same.  He has no new complaints.  Assessment/Plan: Principal Problem:   SIRS (systemic inflammatory response syndrome) (HCC) Active Problems:   History of tongue cancer   Orthostatic hypotension   Hypothyroidism   MGUS (monoclonal gammopathy of unknown significance)   Dementia (HCC)   Pressure injury of skin  Sepsis secondary to aspiration pneumonia Presented with fever T-max 102.1, heart rate 130, WBC 15, chest x-ray personally reviewed showing pulmonary infiltrates in the medial right lung base. Received IV vancomycin and cefepime, MRSA screen negative, DC IV vancomycin on 07/24/2021. Continue cefepime, complete 5 days. Procalcitonin is downtrending. Continue pulmonary toilet Add DuoNebs every 6 hours  Acute hypoxic  respiratory failure secondary to above Maintain O2 saturation greater than 92%.  He is currently on 4 L nasal cannula. Continue to closely monitor blood  Prolonged QTC Twelve-lead EKG done on 07/23/2021 with QTC greater than 550 Optimize magnesium and potassium levels Avoid QTC prolonging agents Repeat 12 EKG on 07/25/2021.  History of tongue cancer s/p radiation and chemotherapy with chronic dysphagia Seen by speech therapist Family made decision to continue diet despite ongoing aspiration Continue aspiration precautions  Hypothyroidism Continue home levothyroxine  History of CVA with right-sided carotid occlusion Continue aspirin, Brilinta and statin.  Polyneuropathy Continue gabapentin  Chronic anxiety disorder Continue p.o. Ativan   Code Status: DNR  Family Communication: None at bedside  Disposition Plan: Likely will discharge to home   Consultants: None  Procedures: None  Antimicrobials: Cefepime IV vancomycin, DC'd on 07/24/2021.  DVT prophylaxis: Subcu Lovenox daily  Status is: Inpatient  Patient will require at least 2 midnights for further evaluation and treatment of present condition.        Objective: Vitals:   07/25/21 0931 07/25/21 1100 07/25/21 1314 07/25/21 1336  BP: (!) 154/92 (!) 142/50 95/61 95/61   Pulse: (!) 117 100 92 92  Resp: 20 (!) 22 (!) 22 (!) 22  Temp: 98 F (36.7 C) 98.8 F (37.1 C) 99.5 F (37.5 C) 99.5 F (37.5 C)  TempSrc:   Oral   SpO2:  91% 96%   Weight:      Height:        Intake/Output Summary (Last 24 hours) at 07/25/2021 1518 Last data filed at 07/25/2021 1341 Gross per 24 hour  Intake 240 ml  Output --  Net 240 ml   Filed Weights   07/22/21 2306 07/24/21 0345 07/24/21 0449  Weight: 77.1 kg 73.6 kg 73.6 kg  Exam:  General: 80 y.o. year-old male well-developed well-nourished in no acute distress.  He is alert and interactive.   Cardiovascular: Regular rate and rhythm no rubs or gallops.    Respiratory: Diffuse rales bilaterally.  Poor inspiratory effort.   Abdomen: Soft nontender normal bowel sounds present.  Musculoskeletal: No lower extremity edema bilaterally.   Skin: No ulcerative lesions noted. Psychiatry: Mood is appropriate for condition and setting.   Data Reviewed: CBC: Recent Labs  Lab 07/22/21 1807 07/22/21 1843 07/23/21 0410 07/24/21 0518  WBC 12.5*  --  15.4* 10.7*  NEUTROABS 10.5*  --   --   --   HGB 12.8* 12.9* 11.0* 13.0  HCT 39.2 38.0* 33.9* 39.1  MCV 94.7  --  93.4 91.6  PLT 156  --  136* 989   Basic Metabolic Panel: Recent Labs  Lab 07/22/21 1807 07/22/21 1843 07/23/21 0410 07/25/21 0226  NA 137 140 137 139  K 4.4 3.8 4.2 3.5  CL 102  --  103 103  CO2 26  --  27 28  GLUCOSE 104*  --  124* 100*  BUN 20  --  16 21  CREATININE 1.26*  --  1.09 1.11  CALCIUM 9.1  --  8.8* 9.2   GFR: Estimated Creatinine Clearance: 55.3 mL/min (by C-G formula based on SCr of 1.11 mg/dL). Liver Function Tests: Recent Labs  Lab 07/22/21 1807 07/23/21 0410  AST 31 22  ALT 27 22  ALKPHOS 37* 28*  BILITOT 0.8 0.6  PROT 7.1 6.3*  ALBUMIN 3.4* 2.8*   No results for input(s): LIPASE, AMYLASE in the last 168 hours. No results for input(s): AMMONIA in the last 168 hours. Coagulation Profile: Recent Labs  Lab 07/22/21 1950  INR 1.1   Cardiac Enzymes: No results for input(s): CKTOTAL, CKMB, CKMBINDEX, TROPONINI in the last 168 hours. BNP (last 3 results) No results for input(s): PROBNP in the last 8760 hours. HbA1C: No results for input(s): HGBA1C in the last 72 hours. CBG: No results for input(s): GLUCAP in the last 168 hours. Lipid Profile: No results for input(s): CHOL, HDL, LDLCALC, TRIG, CHOLHDL, LDLDIRECT in the last 72 hours. Thyroid Function Tests: No results for input(s): TSH, T4TOTAL, FREET4, T3FREE, THYROIDAB in the last 72 hours. Anemia Panel: No results for input(s): VITAMINB12, FOLATE, FERRITIN, TIBC, IRON, RETICCTPCT in the last  72 hours. Urine analysis:    Component Value Date/Time   COLORURINE YELLOW 07/22/2021 1807   APPEARANCEUR CLEAR 07/22/2021 1807   LABSPEC 1.011 07/22/2021 1807   LABSPEC 1.005 06/25/2011 1502   PHURINE 7.0 07/22/2021 1807   GLUCOSEU NEGATIVE 07/22/2021 1807   HGBUR NEGATIVE 07/22/2021 1807   BILIRUBINUR NEGATIVE 07/22/2021 1807   BILIRUBINUR Negative 06/25/2011 1502   KETONESUR 5 (A) 07/22/2021 1807   PROTEINUR NEGATIVE 07/22/2021 1807   UROBILINOGEN 0.2 10/09/2011 1634   NITRITE NEGATIVE 07/22/2021 1807   LEUKOCYTESUR NEGATIVE 07/22/2021 1807   LEUKOCYTESUR Negative 06/25/2011 1502   Sepsis Labs: @LABRCNTIP (procalcitonin:4,lacticidven:4)  ) Recent Results (from the past 240 hour(s))  Resp Panel by RT-PCR (Flu A&B, Covid) Nasopharyngeal Swab     Status: None   Collection Time: 07/22/21  6:16 PM   Specimen: Nasopharyngeal Swab; Nasopharyngeal(NP) swabs in vial transport medium  Result Value Ref Range Status   SARS Coronavirus 2 by RT PCR NEGATIVE NEGATIVE Final    Comment: (NOTE) SARS-CoV-2 target nucleic acids are NOT DETECTED.  The SARS-CoV-2 RNA is generally detectable in upper respiratory specimens during the acute phase of infection. The  lowest concentration of SARS-CoV-2 viral copies this assay can detect is 138 copies/mL. A negative result does not preclude SARS-Cov-2 infection and should not be used as the sole basis for treatment or other patient management decisions. A negative result may occur with  improper specimen collection/handling, submission of specimen other than nasopharyngeal swab, presence of viral mutation(s) within the areas targeted by this assay, and inadequate number of viral copies(<138 copies/mL). A negative result must be combined with clinical observations, patient history, and epidemiological information. The expected result is Negative.  Fact Sheet for Patients:  EntrepreneurPulse.com.au  Fact Sheet for Healthcare  Providers:  IncredibleEmployment.be  This test is no t yet approved or cleared by the [place] FDA and  has been authorized for detection and/or diagnosis of SARS-CoV-2 by FDA under an Emergency Use Authorization (EUA). This EUA will remain  in effect (meaning this test can be used) for the duration of the COVID-19 declaration under Section 564(b)(1) of the Act, 21 U.S.C.section 360bbb-3(b)(1), unless the authorization is terminated  or revoked sooner.       Influenza A by PCR NEGATIVE NEGATIVE Final   Influenza B by PCR NEGATIVE NEGATIVE Final    Comment: (NOTE) The Xpert Xpress SARS-CoV-2/FLU/RSV plus assay is intended as an aid in the diagnosis of influenza from Nasopharyngeal swab specimens and should not be used as a sole basis for treatment. Nasal washings and aspirates are unacceptable for Xpert Xpress SARS-CoV-2/FLU/RSV testing.  Fact Sheet for Patients: EntrepreneurPulse.com.au  Fact Sheet for Healthcare Providers: IncredibleEmployment.be  This test is not yet approved or cleared by the [place] FDA and has been authorized for detection and/or diagnosis of SARS-CoV-2 by FDA under an Emergency Use Authorization (EUA). This EUA will remain in effect (meaning this test can be used) for the duration of the COVID-19 declaration under Section 564(b)(1) of the Act, 21 U.S.C. section 360bbb-3(b)(1), unless the authorization is terminated or revoked.  Performed at [facility], [place] [address], [place], [place] [address]   Culture, blood (Routine x 2)     Status: None (Preliminary result)   Collection Time: 07/22/21  6:38 PM   Specimen: BLOOD  Result Value Ref Range Status   Specimen Description BLOOD LEFT ANTECUBITAL  Final   Special Requests   Final    BOTTLES DRAWN AEROBIC AND ANAEROBIC Blood Culture results may not be optimal due to an inadequate volume of blood received in culture bottles   Culture    Final    NO GROWTH 3 DAYS Performed at [facility], [place] [address], [place], [place] [address]    Report Status PENDING  Incomplete  Urine Culture     Status: None   Collection Time: 07/22/21  8:38 PM   Specimen: In/Out Cath Urine  Result Value Ref Range Status   Specimen Description IN/OUT CATH URINE  Final   Special Requests NONE  Final   Culture   Final    NO GROWTH Performed at [facility], [place] [address], [place], [place] [address]    Report Status 07/23/2021 FINAL  Final  MRSA Next Gen by PCR, Nasal     Status: None   Collection Time: 07/24/21 10:36 AM   Specimen: Nasal Mucosa; Nasal Swab  Result Value Ref Range Status   MRSA by PCR Next Gen NOT DETECTED NOT DETECTED Final    Comment: (NOTE) The GeneXpert MRSA Assay (FDA approved for NASAL specimens only), is one component of a comprehensive MRSA colonization surveillance  program. It is not intended to diagnose MRSA infection nor to guide or monitor treatment for MRSA infections. Test performance is not FDA approved in patients less than 2 years old. Performed at [facility], [place] [address] [place], [place] [address]       Studies: No results found.  Scheduled Meds:  aspirin EC  81 mg Oral QHS   donepezil  5 mg Oral QHS   enoxaparin (LOVENOX) injection  40 mg Subcutaneous QHS   feeding supplement  237 mL Oral BID BM   fludrocortisone  0.1 mg Oral Daily   gabapentin  900 mg Oral TID   guaiFENesin  1,200 mg Oral BID   levothyroxine  100 mcg Oral QAC breakfast   LORazepam  1 mg Oral TID   pilocarpine  7.5 mg Oral TID   pyridostigmine  60 mg Oral TID   rosuvastatin  20 mg Oral QHS   sodium chloride HYPERTONIC  4 mL Nebulization Daily   ticagrelor  90 mg Oral BID    Continuous Infusions:  ceFEPime (MAXIPIME) IV 2 g (07/25/21 0639)     LOS: 2 days     Kayleen Memos, MD [facility] Pager [phone]  If 7PM-7AM, please contact night-coverage [email or web address] Password  [facility] 07/25/2021, 3:18 PM

## 2021-07-25 NOTE — Progress Notes (Signed)
Pharmacy Antibiotic Note  George Bradley is a 80 y.o. male admitted on 07/22/2021 with sepsis.  Pharmacy has been consulted for vancomycin and cefepime dosing.  Presenting from home with generalized weakness - has history of dementia. Cr is stable, vancomycin D/Cd, cultures negative thus far.   Plan: Cefepime 2g IV every 12 hours F/U cultures, LOT, renal function  Height: 6\' 1"  (185.4 cm) Weight: 73.6 kg (162 lb 4.1 oz) IBW/kg (Calculated) : 79.9  Temp (24hrs), Avg:98.3 F (36.8 C), Min:97.6 F (36.4 C), Max:98.5 F (36.9 C)  Recent Labs  Lab 07/22/21 1807 07/22/21 2114 07/23/21 0410 07/24/21 0518 07/25/21 0226  WBC 12.5*  --  15.4* 10.7*  --   CREATININE 1.26*  --  1.09  --  1.11  LATICACIDVEN 1.8 1.7  --   --   --      Estimated Creatinine Clearance: 55.3 mL/min (by C-G formula based on SCr of 1.11 mg/dL).    Allergies  Allergen Reactions   Doxepin Other (See Comments)    Left-sided weakness appeared the morning after this was first taken   Zetia [Ezetimibe] Other (See Comments)    "made me feel bad"    Antimicrobials this admission: Vancomycin 10/31 >> 11/2 Cefepime 10/31 >>   Dose adjustments this admission: N/A  Microbiology results: 10/31 BCx: NGTD 10/31 UCx: negative   Thank you for allowing pharmacy to be a part of this patient's care.  Arrie Senate, PharmD, BCPS, [facility] Clinical Pharmacist [phone] Please check AMION for all [place] numbers 07/25/2021

## 2021-07-26 ENCOUNTER — Inpatient Hospital Stay (HOSPITAL_COMMUNITY): Payer: Medicare Other

## 2021-07-26 DIAGNOSIS — R6510 Systemic inflammatory response syndrome (SIRS) of non-infectious origin without acute organ dysfunction: Secondary | ICD-10-CM | POA: Diagnosis not present

## 2021-07-26 DIAGNOSIS — F03A0 Unspecified dementia, mild, without behavioral disturbance, psychotic disturbance, mood disturbance, and anxiety: Secondary | ICD-10-CM

## 2021-07-26 DIAGNOSIS — Z85810 Personal history of malignant neoplasm of tongue: Secondary | ICD-10-CM

## 2021-07-26 DIAGNOSIS — A419 Sepsis, unspecified organism: Principal | ICD-10-CM

## 2021-07-26 DIAGNOSIS — Z7189 Other specified counseling: Secondary | ICD-10-CM

## 2021-07-26 DIAGNOSIS — Z66 Do not resuscitate: Secondary | ICD-10-CM

## 2021-07-26 DIAGNOSIS — Z515 Encounter for palliative care: Secondary | ICD-10-CM

## 2021-07-26 LAB — CBC
HCT: 36.2 % — ABNORMAL LOW (ref 39.0–52.0)
Hemoglobin: 12.1 g/dL — ABNORMAL LOW (ref 13.0–17.0)
MCH: 30.5 pg (ref 26.0–34.0)
MCHC: 33.4 g/dL (ref 30.0–36.0)
MCV: 91.2 fL (ref 80.0–100.0)
Platelets: 165 10*3/uL (ref 150–400)
RBC: 3.97 MIL/uL — ABNORMAL LOW (ref 4.22–5.81)
RDW: 15.1 % (ref 11.5–15.5)
WBC: 11.8 10*3/uL — ABNORMAL HIGH (ref 4.0–10.5)
nRBC: 0 % (ref 0.0–0.2)

## 2021-07-26 LAB — BASIC METABOLIC PANEL
Anion gap: 11 (ref 5–15)
BUN: 29 mg/dL — ABNORMAL HIGH (ref 8–23)
CO2: 23 mmol/L (ref 22–32)
Calcium: 8.8 mg/dL — ABNORMAL LOW (ref 8.9–10.3)
Chloride: 100 mmol/L (ref 98–111)
Creatinine, Ser: 1.25 mg/dL — ABNORMAL HIGH (ref 0.61–1.24)
GFR, Estimated: 58 mL/min — ABNORMAL LOW (ref >60)
Glucose, Bld: 113 mg/dL — ABNORMAL HIGH (ref 70–99)
Potassium: 3.1 mmol/L — ABNORMAL LOW (ref 3.5–5.1)
Sodium: 134 mmol/L — ABNORMAL LOW (ref 135–145)

## 2021-07-26 LAB — GLUCOSE, CAPILLARY: Glucose-Capillary: 117 mg/dL — ABNORMAL HIGH (ref 70–99)

## 2021-07-26 LAB — MAGNESIUM: Magnesium: 1.8 mg/dL (ref 1.7–2.4)

## 2021-07-26 MED ORDER — LACTATED RINGERS IV SOLN: INTRAVENOUS | Status: DC
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
## 2021-07-26 NOTE — Significant Event (Signed)
Rapid Response Event Note   Reason for Call :  Syncopal episode on BSC. Prior to getting pt OOB to [facility], RN gave him 2.5mg  IV metoprolol for SBP>180.  Initial Focused Assessment:  Pt sitting on BSC slumped over, unresponsive to deep painful stimuli, pulse and breathing present. Pt has a R sided gaze. Unable to obtain BP, HR-110s. NS bolus started. After about 9min, pt began to shake his R arm and moan, then eventually was able to speak, though confused, and follow some commands. Pt was placed back in bed and more in-depth assessment done. He is alert x 2, able to wiggle his toes, squeeze R hand and hold R arm up. He won't squeeze L hand and, though able to pull back against resistance with his R arm, he won't readily hold it up. Pt is very uncooperative with exam. He keeps saying "no" to everything he is asked to do. Pupils 5, equal and sluggish. Pt no longer has R side gaze. Lungs diminished t/o, skin hot to touch. Pt BUE shaking.   T-100.7, HR-110s, BP-unable to obtain, RR-30s, SpO2-97% on [place] 3L.  After about 200cc NS, SBP-130s.   Once back in bed, HR increased to 160(ST).   Interventions:  NS bolus EKG-Wide QRS tachycardia, RBBB, L ant fasc block CBC/BMP/Mg CT  head once more stable. Bedrest tonight Plan of Care:  Pt now sleeping in no distress. HR-116, SBP-100s, shaking no longer present. CT when able. Bedrest tonight please.  Continue to monitor closely. Call RRT if further assistance needed.   Event Summary:   MD Notified: Dr. Velia Meyer notified by bedside RN and myself t/o episode Call Thomas  Dillard Essex, RN

## 2021-07-26 NOTE — Progress Notes (Signed)
SBPs soft, ranging from 80s to low 100s.  RR 30s, Temp 102.8.  Dr. Velia Meyer notified.  Orders received and implemented.  [name]

## 2021-07-26 NOTE — Progress Notes (Signed)
Physical Therapy Treatment Patient Details Name: George Bradley MRN: [id number] DOB: 14-Oct-1940 Today's Date: 07/26/2021   History of Present Illness Pt is an 80 y.o. male who presented 07/22/21 s/p fall with generalized weakness, AMS, multiple episodes of nonbloody emesis, cough with intermittent yellow/Tal sputum and chest pain with coughing. Admitted for sepsis due to suspected aspiration pneumonia. CT of head revealed no acute intracranial abnormality, small remote left occipital  cortical infarct. 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 Laural Golden, GI), xerostomia due to XRT    PT Comments    Success noted with progressing pt's gait distance and safety when in a more open/wide environment of a straight hallway this date, rather than negotiating turns and obstacles in a small room as occurred during prior sessions. However, pt continues to display limitations in endurance and lower extremity muscular strength, placing him at high risk for falls. Will continue to follow acutely. Current recommendations remain appropriate.    Recommendations for follow up therapy are one component of a multi-disciplinary discharge planning process, led by the attending physician.  Recommendations may be updated based on patient status, additional functional criteria and insurance authorization.  Follow Up Recommendations  Acute inpatient rehab (3hours/day)     Assistance Recommended at Discharge Frequent or constant Supervision/Assistance  Equipment Recommendations  3in1 (PT)    Recommendations for Other Services       Precautions / Restrictions Precautions Precautions: Fall;Other (comment) Precaution Comments: monitor  vitals Restrictions Weight Bearing Restrictions: No     Mobility  Bed Mobility Overal bed mobility: Needs Assistance Bed Mobility: Supine to Sit       Sit to supine: HOB elevated;Min assist   General bed mobility comments: Extra time and minA HHA to pull trunk to sit up and scoot hips to EOB.    Transfers Overall transfer level: Needs assistance Equipment used: Rolling walker (2 wheels) Transfers: Sit to/from Stand Sit to Stand: Min assist           General transfer comment: Cues provided to scoot to edge of seat and push up from sitting surface, extra time and repetition needed to complete, but minA to power up and steady to stand from various surfaces.    Ambulation/Gait Ambulation/Gait assistance: Mod assist;Min assist;+2 safety/equipment Gait Distance (Feet): 118 Feet (x2 bouts of ~7 ft > ~118 ft) Assistive device: Rolling walker (2 wheels) Gait Pattern/deviations: Decreased step length - left;Decreased stride length;Trunk flexed;Step-through pattern Gait velocity: reduced Gait velocity interpretation: <1.8 ft/sec, indicate of risk for recurrent falls General Gait Details: Pt with improved step-through gait consistency. Provided tactile and verbal cues to increase L step length and improve upright posture. MinA for balance initially but modA as he fatigued and his hips and knees flexed even more. Chair follow by family member. Primarily navigated straight hallway today vs turns and obstacles in room during prior session.   Stairs             Wheelchair Mobility    Modified Rankin (Stroke Patients Only) Modified Rankin (Stroke Patients Only) Pre-Morbid Rankin Score: Moderate disability Modified Rankin: Moderately severe disability     Balance Overall balance assessment: Needs assistance Sitting-balance support: Feet supported;Bilateral upper extremity supported Sitting balance-Leahy Scale: Poor Sitting balance - Comments: Min guard assist-minA with UE  support to sit statically  EOB, posterior bias noted. Postural control: Posterior lean Standing balance support: Reliant on assistive device for balance Standing balance-Leahy Scale: Poor Standing balance comment: UE support on RW with mobility.                            Cognition Arousal/Alertness: Awake/alert Behavior During Therapy: WFL for tasks assessed/performed Overall Cognitive Status: History of cognitive impairments - at baseline                                 General Comments: Pt with hx of dementia. Pt displays deficits in sequencing capabilities and memory, needing repeated simple multi-modal cues to prepare for all functional mobility, poor carryover of cues. Poor awareness of posterior bias in sitting today.        Exercises      General Comments General comments (skin integrity, edema, etc.): SpO2 >/= 83% on RA with unreliable plth noted      Pertinent Vitals/Pain Pain Assessment: No/denies pain    Home Living Family/patient expects to be discharged to:: Private residence Living Arrangements: Spouse/significant other Available Help at Discharge: Family;Available 24 hours/day Type of Home: House Home Access: Stairs to enter Entrance Stairs-Rails: None Entrance Stairs-Number of Steps: 1   Home Layout: Multi-level;Able to live on main level with bedroom/bathroom Home Equipment: Rolling Walker (2 wheels);Cane - quad;Grab bars - tub/shower;Hospital bed;Transport chair Additional Comments: Wife is a retired acute PT. Wife has call button along with baby monitor and baby gates on stairs to assist in monitoring pt's safety.    Prior Function            PT Goals (current goals can now be found in the care plan section) Acute Rehab PT Goals Patient Stated Goal: to go to inpt rehab tomorrow PT Goal Formulation: With patient/family Time For Goal Achievement: 08/07/21 Potential to Achieve Goals: Fair Progress towards PT goals: Progressing  toward goals    Frequency    Min 3X/week      PT Plan Current plan remains appropriate    Co-evaluation              AM-PAC PT "6 Clicks" Mobility   Outcome Measure  Help needed turning from your back to your side while in a flat bed without using bedrails?: A Little Help needed moving from lying on your back to sitting on the side of a flat bed without using bedrails?: A Little Help needed moving to and from a bed to a chair (including a wheelchair)?: A Little Help needed standing up from a chair using your arms (e.g., wheelchair or bedside chair)?: A Little Help needed to walk in hospital room?: A Lot Help needed climbing 3-5 steps with a railing? : Total 6 Click Score: 15    End of Session Equipment Utilized During Treatment: Gait belt Activity Tolerance: Patient tolerated treatment well Patient left: with call bell/phone within reach;in chair;with chair alarm set;with family/visitor present;with nursing/sitter in room Nurse Communication: Mobility status PT Visit Diagnosis: Unsteadiness on feet (R26.81);Other abnormalities of gait and mobility (R26.89);Muscle weakness (generalized) (M62.81);Repeated falls (R29.6);History of falling (Z91.81);Difficulty in walking, not elsewhere classified (R26.2)     Time: 1339-1410 PT Time Calculation (min) (ACUTE ONLY): 31 min  Charges:  $Gait Training: 8-22 mins $Therapeutic Activity: 8-22 mins  Moishe Spice, PT, DPT Acute Rehabilitation Services  Pager: [phone] Office: [place] 07/26/2021, 4:09 PM

## 2021-07-26 NOTE — Evaluation (Signed)
Occupational Therapy Evaluation Patient Details Name: George Bradley MRN: [id number] DOB: 1941-02-25 Today's Date: 07/26/2021   History of Present Illness Pt is an 80 y.o. male who presented 07/22/21 s/p fall with generalized weakness, AMS, multiple episodes of nonbloody emesis, cough with intermittent yellow/Camp sputum and chest pain with coughing. Admitted for sepsis due to suspected aspiration pneumonia. CT of head revealed no acute intracranial abnormality, small remote left occipital  cortical infarct. 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 PTA pt reported that he was completing ADL's at home independently and wife supervises when he is feeling weak. At this time pt presents with increased weakness and balance deficits, requiring min guard to min A for functional mobility. Additionally due to deficits listed, pt is requiring set up to mod A for all ADL's. Recommend CIR at this time to maximize pt's return to prior level of functioning and to relieve caregiver burden. OT will follow acutely.       Recommendations for follow up therapy are one component of a multi-disciplinary discharge planning process, led by the attending physician.  Recommendations may be updated based on patient status, additional functional criteria and insurance authorization.   Follow Up Recommendations  Acute inpatient rehab (3hours/day)    Assistance Recommended at Discharge Frequent or constant Supervision/Assistance  Functional Status Assessment  Patient has had a recent decline in their functional status and demonstrates the ability to  make significant improvements in function in a reasonable and predictable amount of time.  Equipment Recommendations  Other (comment) (TBD by next venue)    Recommendations for Other Services Rehab consult     Precautions / Restrictions Precautions Precautions: Fall;Other (comment) Precaution Comments: monitor vitals Restrictions Weight Bearing Restrictions: No      Mobility Bed Mobility Overal bed mobility: Needs Assistance Bed Mobility: Supine to Sit       Sit to supine: HOB elevated;Min assist   General bed mobility comments: Extra time and minA HHA to pull trunk to sit up and scoot hips to EOB.    Transfers Overall transfer level: Needs assistance Equipment used: Rolling walker (2 wheels) Transfers: Sit to/from Stand Sit to Stand: Min assist           General transfer comment: Cues provided to scoot to edge of seat and push up from sitting surface, extra time and repetition needed to complete, but minA to power up and steady to stand from various surfaces.      Balance Overall balance assessment: Needs assistance Sitting-balance support: Feet supported;Bilateral upper extremity supported Sitting balance-Leahy Scale: Poor Sitting balance - Comments: Min guard assist-minA with UE support to sit statically EOB, posterior bias noted. Postural control: Posterior lean Standing balance support: Reliant on assistive device for balance Standing balance-Leahy Scale: Poor Standing balance comment: UE support on RW with mobility.                           ADL either performed or assessed with clinical judgement   ADL Overall ADL's : Needs assistance/impaired Eating/Feeding: Set up;Sitting   Grooming: Set up;Sitting   Upper Body Bathing: Min guard;Sitting   Lower Body Bathing: Moderate assistance;Sitting/lateral leans;Sit to/from  stand   Upper Body Dressing : Min guard;Sitting   Lower Body Dressing: Minimal assistance;Sitting/lateral leans;Sit to/from  stand   Toilet Transfer: Minimal assistance;Ambulation   Toileting- Clothing Manipulation and Hygiene: Moderate assistance;Sitting/lateral lean;Sit to/from stand       Functional mobility during ADLs: Minimal assistance;Rolling walker (2 wheels) General ADL Comments: Pt limited by decreased activity tolerance, weakness, and balance deficits, requiring set up to mod A for all ADL's and functional mobility.     Vision Baseline Vision/History: 1 Wears glasses Ability to See in Adequate Light: 0 Adequate Patient Visual Report: No change from baseline Vision Assessment?: No apparent visual deficits     Perception     Praxis      Pertinent Vitals/Pain Pain Assessment: No/denies pain     Hand Dominance Right   Extremity/Trunk Assessment Upper Extremity Assessment Upper Extremity Assessment: Overall WFL for tasks assessed   Lower Extremity Assessment Lower Extremity Assessment: Defer to PT evaluation   Cervical / Trunk Assessment Cervical / Trunk Assessment: Kyphotic   Communication Communication Communication: No difficulties   Cognition Arousal/Alertness: Awake/alert Behavior During Therapy: WFL for tasks assessed/performed Overall Cognitive Status: History of cognitive impairments - at baseline                                 General Comments: Pt with hx of dementia. Pt displays deficits in sequencing capabilities and memory, needing repeated simple multi-modal cues to prepare for all functional mobility, poor carryover of cues. Poor awareness of posterior bias in sitting today.     General Comments  VSS on RA, O2 96% and HR 83.    Exercises     Shoulder Instructions      Home Living Family/patient expects to be discharged to:: Private residence Living Arrangements: Spouse/significant other Available Help at Discharge: Family;Available 24 hours/day Type of Home: House Home Access: Stairs to enter [employer] of Steps: 1 Entrance  Stairs-Rails: None Home Layout: Multi-level;Able to live on main level with bedroom/bathroom     Bathroom Shower/Tub: Occupational psychologist: Standard Bathroom Accessibility: Yes   Home Equipment: Conservation officer, nature (2 wheels);Cane - quad;Grab bars - tub/shower;Hospital bed;Transport chair   Additional Comments: Wife is a retired acute PT. Wife has call button along with baby monitor and baby gates on stairs to assist in monitoring pt's safety.      Prior Functioning/Environment Prior Level of Function : Needs assist;History of Falls (last six months)             Mobility Comments: Pt with many falls recently. While he owns AD, he does not use it. Likes to do yard work. ADLs Comments: wife assists over past few months        OT Problem List: Decreased strength;Decreased activity tolerance;Impaired balance (sitting and/or standing);Decreased cognition;Decreased safety awareness;Decreased knowledge of use of DME or AE;Cardiopulmonary status limiting activity      OT Treatment/Interventions: Self-care/ADL training;Therapeutic exercise;Energy conservation;DME and/or AE instruction;Therapeutic activities;Patient/family education;Balance training    OT Goals(Current goals can be found in the care plan section) Acute Rehab OT Goals Patient Stated Goal: To go to rehab OT Goal Formulation: With patient Time For Goal Achievement: 08/09/21 Potential to Achieve Goals: Good ADL Goals Pt Will Perform Grooming: with modified independence;standing Pt Will Perform Lower Body Bathing: with min guard assist;sitting/lateral leans;sit to/from stand Pt Will Perform Lower Body Dressing: with min guard assist;sitting/lateral leans;sit to/from stand Pt Will Transfer to  Toilet: with supervision;ambulating Pt Will Perform Toileting - Clothing Manipulation and hygiene: with min guard assist;sitting/lateral leans;sit to/from stand  OT Frequency: Min 2X/week   Barriers to D/C:             Co-evaluation              AM-PAC OT "6 Clicks" Daily Activity     Outcome Measure Help from another person eating meals?: A Little Help from another person taking care of personal grooming?: A Little Help from another person toileting, which includes using toliet, bedpan, or urinal?: A Lot Help from another person bathing (including washing, rinsing, drying)?: A Lot Help from another person to put on and taking off regular upper body clothing?: A Little Help from another person to put on and taking off regular lower body clothing?: A Little 6 Click Score: 16   End of Session Equipment Utilized During Treatment: Gait belt;Rolling walker (2 wheels) Nurse Communication: Mobility status  Activity Tolerance: Patient tolerated treatment well Patient left: in chair;with call bell/phone within reach;with family/visitor present  OT Visit Diagnosis: Unsteadiness on feet (R26.81);Other abnormalities of gait and mobility (R26.89);Muscle weakness (generalized) (M62.81)                Time: 0037-0488 OT Time Calculation (min): 28 min Charges:  OT General Charges $OT Visit: 1 Visit OT Evaluation $OT Eval Moderate Complexity: 1 Mod OT Treatments $Therapeutic Activity: 8-22 mins  Kelcee Bjorn H., OTR/L Acute Rehabilitation  [facility] Ige Elane Shelsie Tijerino 07/26/2021, 4:09 PM

## 2021-07-26 NOTE — Progress Notes (Signed)
2000:  Elevated HR noted on telemetry, 120s-140s.  EKG shows ST with RBBB.  BP 184/84.  Metoprolol 2.5 mg IV given per PRN orders. 2025:  Pt agitated and attempting to get OOB.  States he needs to have a BM.  2 RNs assisted pt to [facility].  This RN stayed with pt and noticed decreased responsiveness while he was sitting on BSC, responding only when stimulated and when his name was called. 2027:  Patient became completely unresponsive to verbal and painful stimuli, and rapid response nurse, Mindy, was called.  Unable to obtain BP.  HR 110s with good radial pulse, RR 30s, O2 sat 92% on 4L/[place]. 2030:  Mindy, Rapid Response RN to bedside.  IVF bolus of 200 mL given. 2035:  SBP 130s.  Pt becoming more responsive and moaning, with right arm tremors.  MD notified of the above. 2040:  Pt able to state his name and states he is in [place] when asked orientation questions.  Pt lifted from [facility] back to bed x 3 assists.  MD orders received and implemented.  Will continue to monitor.  [name]

## 2021-07-26 NOTE — Progress Notes (Signed)
Inpatient Rehabilitation Admissions Coordinator   I have CIR bed to admit him to Saturday when bed is available. I met with him and his wife at bedside and they are in agreement. I have alerted acute team and TOC. Saturday Dr Letta Pate will round on patient and then [place] can contact rehab nurse at [phone] at 12 noon to give report and make the discharge to CIR bed when bed available Saturday after lunch.  Danne Baxter, RN, MSN Rehab Admissions Coordinator [phone] 07/26/2021 12:02 PM

## 2021-07-26 NOTE — Progress Notes (Addendum)
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  Admitted for sepsis due to aspiration pneumonia.  Seen by speech therapist, has chronic dysphagia.  Patient has declined a dysphagia diet.  Seen by palliative care team, patient would like to go to rehab prior to going home with hospice care.  07/26/2021: Seen and examined at his bedside.  He has no new complaints at this time.  He is eager to go to rehab.  Assessment/Plan: Principal Problem:   SIRS (systemic inflammatory response syndrome) (HCC) Active Problems:   History of tongue cancer   Orthostatic hypotension   Hypothyroidism   MGUS (monoclonal gammopathy of unknown significance)   Dementia (HCC)   Pressure injury of skin  Sepsis secondary to aspiration pneumonia Presented with fever T-max 102.1, heart rate 130, WBC 15, chest x-ray personally reviewed showing pulmonary infiltrates in the medial right lung base. Received IV vancomycin and cefepime, MRSA screen negative, DC'd IV vancomycin on 07/24/2021. Completed 5 days of IV cefepime on 07/26/2021. Procalcitonin is downtrending. Continue pulmonary toilet Continue DuoNebs every 6  hours  Acute hypoxic respiratory failure secondary to above Continue to maintain O2 saturation greater than 92%.   He is currently on 4 L nasal cannula. Continue to closely monitor blood  Prolonged QTC Twelve-lead EKG done on 07/23/2021 with QTC greater than 550 Optimize magnesium and potassium levels Continue to avoid QTC prolonging agents Repeat 12 EKG on 07/25/2021, QTC improved 494.  History of tongue cancer s/p radiation and chemotherapy with chronic dysphagia Seen by speech therapist Family made decision to continue diet despite ongoing aspiration Continue aspiration precautions  Hypothyroidism Continue home levothyroxine  History of CVA with right-sided carotid occlusion Continue aspirin, Brilinta and statin.  Polyneuropathy Continue gabapentin  Chronic anxiety disorder Continue p.o. Ativan   Code Status: DNR  Family Communication: None at bedside  Disposition Plan: We will discharge to CIR on 07/27/2021.   Consultants: None  Procedures: None  Antimicrobials: Cefepime IV vancomycin, DC'd on 07/24/2021.  DVT prophylaxis: Subcu Lovenox daily  Status is: Inpatient  Patient will require at least 2 midnights for further evaluation and treatment of present condition.        Objective: Vitals:   07/25/21 1805 07/25/21 2000 07/26/21 0420 07/26/21 1045  BP: 136/88 120/70 (!) 142/88 106/71  Pulse:  84 87 88  Resp:  20 20 20   Temp:  98.5 F (36.9 C) 97.7 F (36.5 C) 97.6 F (36.4 C)  TempSrc:  Oral Oral Oral  SpO2:  93% 94% 93%  Weight:      Height:        Intake/Output Summary (Last 24 hours) at 07/26/2021 1648 Last data filed at 07/26/2021 0011 Gross per 24 hour  Intake --  Output 650 ml  Net -650 ml   Filed Weights   07/22/21 2306 07/24/21 0345  07/24/21 0449  Weight: 77.1 kg 73.6 kg 73.6 kg    Exam:  General: 80 y.o. year-old male well-developed well-nourished in no acute distress.  He is alert and interactive. Cardiovascular:  Regular rate and rhythm no rubs or gallops.  Respiratory: Diffuse rales bilaterally.  Poor inspiratory effort.  Abdomen: Soft nontender normal bowel sounds present.  Musculoskeletal: No lower extremity edema bilaterally. Skin: No ulcerative lesions noted.   Psychiatry: Mood is appropriate for condition and setting.   Data Reviewed: CBC: Recent Labs  Lab 07/22/21 1807 07/22/21 1843 07/23/21 0410 07/24/21 0518  WBC 12.5*  --  15.4* 10.7*  NEUTROABS 10.5*  --   --   --   HGB 12.8* 12.9* 11.0* 13.0  HCT 39.2 38.0* 33.9* 39.1  MCV 94.7  --  93.4 91.6  PLT 156  --  136* 294   Basic Metabolic Panel: Recent Labs  Lab 07/22/21 1807 07/22/21 1843 07/23/21 0410 07/25/21 0226  NA 137 140 137 139  K 4.4 3.8 4.2 3.5  CL 102  --  103 103  CO2 26  --  27 28  GLUCOSE 104*  --  124* 100*  BUN 20  --  16 21  CREATININE 1.26*  --  1.09 1.11  CALCIUM 9.1  --  8.8* 9.2   GFR: Estimated Creatinine Clearance: 55.3 mL/min (by C-G formula based on SCr of 1.11 mg/dL). Liver Function Tests: Recent Labs  Lab 07/22/21 1807 07/23/21 0410  AST 31 22  ALT 27 22  ALKPHOS 37* 28*  BILITOT 0.8 0.6  PROT 7.1 6.3*  ALBUMIN 3.4* 2.8*   No results for input(s): LIPASE, AMYLASE in the last 168 hours. No results for input(s): AMMONIA in the last 168 hours. Coagulation Profile: Recent Labs  Lab 07/22/21 1950  INR 1.1   Cardiac Enzymes: No results for input(s): CKTOTAL, CKMB, CKMBINDEX, TROPONINI in the last 168 hours. BNP (last 3 results) No results for input(s): PROBNP in the last 8760 hours. HbA1C: No results for input(s): HGBA1C in the last 72 hours. CBG: No results for input(s): GLUCAP in the last 168 hours. Lipid Profile: No results for input(s): CHOL, HDL, LDLCALC, TRIG, CHOLHDL, LDLDIRECT in the last 72 hours. Thyroid Function Tests: No results for input(s): TSH, T4TOTAL, FREET4, T3FREE, THYROIDAB in the last 72 hours. Anemia Panel: No results for input(s): VITAMINB12, FOLATE,  FERRITIN, TIBC, IRON, RETICCTPCT in the last 72 hours. Urine analysis:    Component Value Date/Time   COLORURINE YELLOW 07/22/2021 1807   APPEARANCEUR CLEAR 07/22/2021 1807   LABSPEC 1.011 07/22/2021 1807   LABSPEC 1.005 06/25/2011 1502   PHURINE 7.0 07/22/2021 1807   GLUCOSEU NEGATIVE 07/22/2021 1807   HGBUR NEGATIVE 07/22/2021 1807   BILIRUBINUR NEGATIVE 07/22/2021 1807   BILIRUBINUR Negative 06/25/2011 1502   KETONESUR 5 (A) 07/22/2021 1807   PROTEINUR NEGATIVE 07/22/2021 1807   UROBILINOGEN 0.2 10/09/2011 1634   NITRITE NEGATIVE 07/22/2021 1807   LEUKOCYTESUR NEGATIVE 07/22/2021 1807   LEUKOCYTESUR Negative 06/25/2011 1502   Sepsis Labs: @LABRCNTIP (procalcitonin:4,lacticidven:4)  ) Recent Results (from the past 240 hour(s))  Resp Panel by RT-PCR (Flu A&B, Covid) Nasopharyngeal Swab     Status: None   Collection Time: 07/22/21  6:16 PM   Specimen: Nasopharyngeal Swab; Nasopharyngeal(NP) swabs in vial transport medium  Result Value Ref Range Status   SARS Coronavirus 2 by RT PCR NEGATIVE NEGATIVE Final    Comment: (NOTE) SARS-CoV-2 target nucleic acids are NOT DETECTED.  The SARS-CoV-2 RNA is generally detectable in upper  respiratory specimens during the acute phase of infection. The lowest concentration of SARS-CoV-2 viral copies this assay can detect is 138 copies/mL. A negative result does not preclude SARS-Cov-2 infection and should not be used as the sole basis for treatment or other patient management decisions. A negative result may occur with  improper specimen collection/handling, submission of specimen other than nasopharyngeal swab, presence of viral mutation(s) within the areas targeted by this assay, and inadequate number of viral copies(<138 copies/mL). A negative result must be combined with clinical observations, patient history, and epidemiological information. The expected result is Negative.  Fact Sheet for Patients:   EntrepreneurPulse.com.au  Fact Sheet for Healthcare Providers:  IncredibleEmployment.be  This test is no t yet approved or cleared by the [place] FDA and  has been authorized for detection and/or diagnosis of SARS-CoV-2 by FDA under an Emergency Use Authorization (EUA). This EUA will remain  in effect (meaning this test can be used) for the duration of the COVID-19 declaration under Section 564(b)(1) of the Act, 21 U.S.C.section 360bbb-3(b)(1), unless the authorization is terminated  or revoked sooner.       Influenza A by PCR NEGATIVE NEGATIVE Final   Influenza B by PCR NEGATIVE NEGATIVE Final    Comment: (NOTE) The Xpert Xpress SARS-CoV-2/FLU/RSV plus assay is intended as an aid in the diagnosis of influenza from Nasopharyngeal swab specimens and should not be used as a sole basis for treatment. Nasal washings and aspirates are unacceptable for Xpert Xpress SARS-CoV-2/FLU/RSV testing.  Fact Sheet for Patients: EntrepreneurPulse.com.au  Fact Sheet for Healthcare Providers: IncredibleEmployment.be  This test is not yet approved or cleared by the [place] FDA and has been authorized for detection and/or diagnosis of SARS-CoV-2 by FDA under an Emergency Use Authorization (EUA). This EUA will remain in effect (meaning this test can be used) for the duration of the COVID-19 declaration under Section 564(b)(1) of the Act, 21 U.S.C. section 360bbb-3(b)(1), unless the authorization is terminated or revoked.  Performed at [facility], [place] [address], [place], [place] [address]   Culture, blood (Routine x 2)     Status: None (Preliminary result)   Collection Time: 07/22/21  6:38 PM   Specimen: BLOOD  Result Value Ref Range Status   Specimen Description BLOOD LEFT ANTECUBITAL  Final   Special Requests   Final    BOTTLES DRAWN AEROBIC AND ANAEROBIC Blood Culture results may not be optimal due  to an inadequate volume of blood received in culture bottles   Culture   Final    NO GROWTH 4 DAYS Performed at [facility], [place] [address], [place], [place] [address]    Report Status PENDING  Incomplete  Urine Culture     Status: None   Collection Time: 07/22/21  8:38 PM   Specimen: In/Out Cath Urine  Result Value Ref Range Status   Specimen Description IN/OUT CATH URINE  Final   Special Requests NONE  Final   Culture   Final    NO GROWTH Performed at [facility], [place] [address] [place], [place] [address]    Report Status 07/23/2021 FINAL  Final  MRSA Next Gen by PCR, Nasal     Status: None   Collection Time: 07/24/21 10:36 AM   Specimen: Nasal Mucosa; Nasal Swab  Result Value Ref Range Status   MRSA by PCR Next Gen NOT DETECTED NOT DETECTED Final    Comment: (NOTE) The GeneXpert MRSA Assay (FDA approved for NASAL specimens only),  is one component of a comprehensive MRSA colonization surveillance program. It is not intended to diagnose MRSA infection nor to guide or monitor treatment for MRSA infections. Test performance is not FDA approved in patients less than 21 years old. Performed at [facility], [place] [address] [place], [place] [address]       Studies: No results found.  Scheduled Meds:  aspirin EC  81 mg Oral QHS   donepezil  5 mg Oral QHS   enoxaparin (LOVENOX) injection  40 mg Subcutaneous QHS   feeding supplement  237 mL Oral BID BM   fludrocortisone  0.1 mg Oral Daily   gabapentin  900 mg Oral TID   guaiFENesin  1,200 mg Oral BID   levothyroxine  100 mcg Oral QAC breakfast   LORazepam  1 mg Oral TID   pilocarpine  7.5 mg Oral TID   pyridostigmine  60 mg Oral TID   rosuvastatin  20 mg Oral QHS   sodium chloride HYPERTONIC  4 mL Nebulization Daily   ticagrelor  90 mg Oral BID    Continuous Infusions:  ceFEPime (MAXIPIME) IV 2 g (07/26/21 0534)     LOS: 3 days     Kayleen Memos, MD [facility] Pager  [phone]  If 7PM-7AM, please contact night-coverage [email or web address] Password [facility] 07/26/2021, 4:48 PM

## 2021-07-27 ENCOUNTER — Inpatient Hospital Stay (HOSPITAL_COMMUNITY): Payer: Medicare Other

## 2021-07-27 ENCOUNTER — Other Ambulatory Visit (HOSPITAL_COMMUNITY): Payer: Medicare Other

## 2021-07-27 DIAGNOSIS — R55 Syncope and collapse: Secondary | ICD-10-CM | POA: Diagnosis not present

## 2021-07-27 DIAGNOSIS — R6510 Systemic inflammatory response syndrome (SIRS) of non-infectious origin without acute organ dysfunction: Secondary | ICD-10-CM | POA: Diagnosis not present

## 2021-07-27 LAB — PROCALCITONIN: Procalcitonin: 0.95 ng/mL

## 2021-07-27 LAB — POTASSIUM: Potassium: 4 mmol/L (ref 3.5–5.1)

## 2021-07-27 LAB — CULTURE, BLOOD (ROUTINE X 2): Culture: NO GROWTH

## 2021-07-27 MED ORDER — TICAGRELOR 90 MG PO TABS
90.0000 mg | ORAL_TABLET | Freq: Two times a day (BID) | ORAL | Status: DC
  Administered 2021-07-27 – 2021-07-31 (×8): 90 mg via ORAL
  Filled 2021-07-27 (×8): qty 1

## 2021-07-27 MED ORDER — PYRIDOSTIGMINE BROMIDE 60 MG PO TABS
60.0000 mg | ORAL_TABLET | Freq: Three times a day (TID) | ORAL | Status: DC
  Administered 2021-07-27 – 2021-07-31 (×12): 60 mg via ORAL
  Filled 2021-07-27 (×14): qty 1

## 2021-07-27 MED ORDER — DICLOFENAC SODIUM 1 % EX GEL
4.0000 g | Freq: Four times a day (QID) | CUTANEOUS | Status: DC
  Administered 2021-07-27 – 2021-07-31 (×16): 4 g via TOPICAL
  Filled 2021-07-27: qty 100

## 2021-07-27 MED ORDER — POTASSIUM CHLORIDE CRYS ER 20 MEQ PO TBCR
40.0000 meq | EXTENDED_RELEASE_TABLET | Freq: Once | ORAL | Status: AC
  Administered 2021-07-27: 40 meq via ORAL
  Filled 2021-07-27: qty 2

## 2021-07-27 MED ORDER — LORAZEPAM 1 MG PO TABS: 1.0000 mg | ORAL_TABLET | Freq: Three times a day (TID) | ORAL | Status: DC

## 2021-07-27 MED ORDER — ASPIRIN EC 81 MG PO TBEC
81.0000 mg | DELAYED_RELEASE_TABLET | Freq: Every day | ORAL | Status: DC
  Administered 2021-07-28 – 2021-07-30 (×3): 81 mg via ORAL
  Filled 2021-07-27 (×3): qty 1

## 2021-07-27 MED ORDER — LACTATED RINGERS IV SOLN: INTRAVENOUS | Status: DC

## 2021-07-27 MED ORDER — POTASSIUM CHLORIDE CRYS ER 10 MEQ PO TBCR
10.0000 meq | EXTENDED_RELEASE_TABLET | Freq: Three times a day (TID) | ORAL | Status: AC
  Administered 2021-07-27 (×3): 10 meq via ORAL
  Filled 2021-07-27 (×3): qty 1

## 2021-07-27 MED ORDER — OXYCODONE HCL 5 MG PO TABS
5.0000 mg | ORAL_TABLET | Freq: Four times a day (QID) | ORAL | Status: DC | PRN
  Administered 2021-07-27 – 2021-07-30 (×8): 5 mg via ORAL
  Filled 2021-07-27 (×8): qty 1

## 2021-07-27 MED ORDER — PILOCARPINE HCL 5 MG PO TABS
7.5000 mg | ORAL_TABLET | Freq: Three times a day (TID) | ORAL | Status: DC
  Administered 2021-07-27 – 2021-07-31 (×12): 7.5 mg via ORAL
  Filled 2021-07-27 (×14): qty 2

## 2021-07-27 MED ORDER — GABAPENTIN 300 MG PO CAPS
900.0000 mg | ORAL_CAPSULE | Freq: Three times a day (TID) | ORAL | Status: DC
  Administered 2021-07-27 – 2021-07-31 (×12): 900 mg via ORAL
  Filled 2021-07-27 (×12): qty 3

## 2021-07-27 MED ORDER — BOOST / RESOURCE BREEZE PO LIQD CUSTOM
1.0000 | Freq: Three times a day (TID) | ORAL | Status: DC
  Administered 2021-07-27 – 2021-07-31 (×6): 1 via ORAL

## 2021-07-27 MED ORDER — ENOXAPARIN SODIUM 40 MG/0.4ML IJ SOSY
40.0000 mg | PREFILLED_SYRINGE | Freq: Every day | INTRAMUSCULAR | Status: DC
  Administered 2021-07-28 – 2021-07-30 (×3): 40 mg via SUBCUTANEOUS
  Filled 2021-07-27 (×3): qty 0.4

## 2021-07-27 MED ORDER — LORAZEPAM 0.5 MG PO TABS
0.5000 mg | ORAL_TABLET | Freq: Three times a day (TID) | ORAL | Status: DC | PRN
  Administered 2021-07-27 – 2021-07-30 (×4): 0.5 mg via ORAL
  Filled 2021-07-27 (×5): qty 1

## 2021-07-27 MED ORDER — POTASSIUM CHLORIDE 20 MEQ PO PACK: 40.0000 meq | PACK | Freq: Once | ORAL | Status: DC

## 2021-07-27 NOTE — Progress Notes (Signed)
[facility] night cross cover note:  The patient experienced a witnessed syncopal episode while sitting on bedside commode, without resultant fall off of the commode or hitting head.  Not associated with any reported generalized tonic-clonic activity.  Associated vital signs at the time notable for subjective fever with temperature of 102, heart rate into the 161W, systolic blood pressure in the 90s, while maintaining oxygen saturations in the mid 90s on 3 to 4 L nasal cannula, with this latter aspect unchanged relative to the patient supplemental oxygen requirements over the last several days.  EKG at the time showed wide complex tachycardia with known bifascicular block similar to that on EKG from 07/23/2021, with heart rate 150 and QTc 552 compared to QTC of 557 on 07/23/2021.  Corresponding sinus tachycardia reported on tele. 1 L normal saline bolus administered, following which there was will decrease the patient's tachycardia as well as improving blood pressure.  In the setting of syncope with presence of QTC prolongation, stat serum magnesium and BMP ordered.  Additionally, in the setting of syncope associated with tachycardia and soft blood pressure, the context of being on multiple blood thinning agents, including Brilinta, aspirin, Lovenox, stat CBC also ordered to assess for any interval contributory anemia.  I also ordered chest x-ray, including for assessment of interval worsening of previously guided aspiration pneumonia, particularly given the presence of objective fever, as above.  Also ordered procalcitonin level to further with this result currently pending, although the chest x-ray ultimately showed no evidence of process.  Of note, CBG performed at time of syncopal episode not associated with hypoglycemia.  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   Aforementioned stat BMP notable for potassium 3.1, BUN 29, creatinine 1.25 compared to most recent prior value of 1.11 on 07/25/2021, and associated with BUN to creatinine ratio 23.2.  In reviewing this BMP result, including associated acute prerenal azotemia in correlating with proving tachycardia and hypotension with IV fluids in this patient who was previously n.p.o. in the setting of concern for aspiration pneumonia due to chronic dysphagia, there appears to be clinical suggestion element of dehydration.  Consequently, patient started on continuous lactated Ringer's at 75 cc/h, with 40 mill equivalents of liquid potassium chloride ordered.  Serum magnesium level noted to be 1.8.  Aforementioned stat CBC showed relatively stable hemoglobin level of 12.1 relative to 13 on 07/24/2021, and compared to value of 11 on 07/23/21.   At this time differential for the patient's single episode includes orthostatic hypotension given a documented history of such, with potential exacerbation from intravascular depletion as further detailed above, including in setting of potential adrenal insufficiency given scheduled fludrocortisone use,  versus micturition syncope versus vasovagal influence versus ventricular arrhythmia while noting QTC prolongation, as above.  We will also follow-up results of CT head ordered per recommendation of neurology, as above.  There is also the possibility fall precautions ordered. Repeat ekg in AM to further trend degree of QTC prolongation. Will also follow-up results procalcitonin level.     Babs Bertin, DO Hospitalist

## 2021-07-27 NOTE — Plan of Care (Signed)

## 2021-07-27 NOTE — Consult Note (Addendum)
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 07/23/2021 FINDINGS: The cardiac size is normal. There are median sternotomy sutures again noted. The aorta is tortuous and calcified. no vascular congestion is seen. The lungs show mild chronic changes without focal pneumonia with interval clearance of prior right infrahilar opacity which was probably atelectasis. Osteopenia and thoracic spondylosis. IMPRESSION: No evidence of acute chest disease.  Chronic change. Electronically Signed   By: Telford Nab M.D.   On: 07/26/2021 22:41     Assessment and Plan:   Syncope -Patient had a syncopal episode last night on the bedside commode.  Bed response  was called.  Initial BP was undetectable.  Systolic BP in the 106Y after IV fluid bolus.  Heart rate are in the 110-140 range.  Cardiology was consulted due to concern for arrhythmia induced syncope. -Recent echo in 04/2021 showed normal LV function with moderate AS.  We will check with MD about whether or not he recommends repeat echo. -Patient has underlying dementia and no family is at bedside.  He does not member syncopal episode last night.  Reviewed notes in chart and it sounds like this may have been vasovagal syncope as it occurred after patient was to have a bowel movement.  Reviewed telemetry -he was tachycardic last night with rates intermittently as high as the 140s to 160s.  Looks like he had intermittent short episodes of PAT and even some atrial fibrillation at times.  We will review this with MD.  However, I am not commit that this is what caused the syncopal episode.  Likely need outpatient monitor.  CAD - S/p CABG x4 in 2000. Last cath in 2018 showed patent grafts.  - No angina.  - On DAPT with Aspirin and Brilinta for CVA. - Continue high-intensity statin.  Possible New Onset Atrial Fibrillation -Patient presented in sinus rhythm.  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  Repeat EKG on 07/22/2021 concerning for atrial fibrillation.  However, all other EKGs this admission have showed clear sinus rhythm.  On telemetry, rate is irregular with frequent PACs/PVCs.  Symptoms to be P wave is hard to see due to first-degree AV block and the P wave hiding in  the T wave when the rates are elevated.  However, it does look like he has had a few short runs of atrial fibrillation.  However review with MD. -Rates currently well controlled in the 37s.  Suspect tachycardia last night was due to fever of 102.  He did not tolerate the dose of IV Lopressor well so would hold off on any beta-blockers. - CHA2DS2-VASc = 5 (CAD, stroke x2, age x2). If truly  atrial fibrillation, will need to discuss starting DOAC and will need to adjust DAPT. Will review with MD.  Elevated Troponin  - High-sensitivity troponin mildly elevated at 19 >> 40 >> 68. - No acute ischemic changes on EKG. - No chest pain. - Suspect demand ischemia in setting of acute illness. No ischemic work-up necessary at this time.  Orthostatic Hypotension - Long history of orthostatic hypotension. BP dropped last night after dose of IV Cardizem.  BP elevated this morning. - Would hold off on any additional antihypertensive this morning.  Hyperlipidemia - Continue Crestor 20mg  daily.  Prolonged Qtc - QTc 543 on EKG today. However, he does have known RBBB with QRS interval of 130 ms. - Potassium 3.1 today. Supplemented by primary team - Magnesium within normal limits at 1.8. - Avoid QT prolonging medications.  Otherwise, per primary team: - Sepsis due to recurrent aspiration pneumonia - Acute hypoxic respiratory failure secondary to aspiration pneumonia,  - CVA with right carotid artery occlusion: on DAPT with Aspirin and Brilinta - Hypothyroidism - Dementia - History of tongue cancer   Risk Assessment/Risk Scores:    For questions or updates, please contact [facility] Please consult [email or web address] for contact info under    Signed, Darreld Mclean, PA-C  07/27/2021 8:07 AM  Patient examined chart reviewed discussed care with patient and family Frail elderly male basilar crackles SEM abdomen soft not volume overloaded postural by chart BP currently 174 mmHg systolic Monitor review with SR PAC/PVC no high grade AV block and no sustained VT or arrhythmia Was in NSR during event and tachycardic His symptoms seem to be from postural changes with previous stroke , age and he is being Rx with pilocarpine and mestonon for ? Myesthenia. Given latter would not use beta blocker for ectopy unless have to Tolerate systolic BP 081-448 mmHg to prevent drop in BP and symptoms  with standing No evidence that syncope related to cardiac arrhythmia   Jenkins Rouge MD [facility]

## 2021-07-27 NOTE — Progress Notes (Signed)
Spoke with Dr. Nevada Crane. Due to medications being given late, due to syncopal episode last night, pharmacy will need to reschedule his medications. Spoke with 6E pharmacist and advised.

## 2021-07-27 NOTE — Progress Notes (Signed)
In to reassess patient.  He is now afebrile and mental status has improved, A&O x 3 and able to hold a normal conversation with staff.  Moving left arm without difficulty, and following commands.  States he does not remember having a syncopal episode earlier, but is very appreciative of the care he is receiving.  Patient was able to take HS meds with applesauce.  ASA, Brilinta, and Lovenox held until after head CT.  CT notified of pt's improved status; they stated he will be 3rd on their list.  Will continue to monitor.  [name]

## 2021-07-27 NOTE — Progress Notes (Signed)
   07/26/21 2035  Assess: MEWS Score  BP (!) 133/118  Pulse Rate (!) 113  ECG Heart Rate (!) 113  Resp 19  SpO2 97 %  Assess: MEWS Score  MEWS Temp 0  MEWS Systolic 0  MEWS Pulse 2  MEWS RR 0  MEWS LOC 0  MEWS Score 2  MEWS Score Color Yellow  Assess: if the MEWS score is Yellow or Red  Were vital signs taken at a resting state? Yes  Focused Assessment Change from prior assessment (see assessment flowsheet) (s/p syncopal episode)  Early Detection of Sepsis Score *See Row Information* High  MEWS guidelines implemented *See Row Information* Yes  Treat  MEWS Interventions Escalated (See documentation below);Administered prn meds/treatments;Other (Comment) (Rapid response nurse notified)  Pain Scale 0-10  Pain Score 0  Patients Stated Pain Goal 0  Take Vital Signs  Increase Vital Sign Frequency  Yellow: Q 2hr X 2 then Q 4hr X 2, if remains yellow, continue Q 4hrs  Escalate  MEWS: Escalate Yellow: discuss with charge nurse/RN and consider discussing with provider and RRT  Notify: Charge Nurse/RN  Name of Charge Nurse/RN Notified Glenna Durand, RN  Date Charge Nurse/RN Notified 07/25/21  Time Charge Nurse/RN Notified 2035  Notify: Provider  Provider Name/Title Dr. Velia Meyer  Date Provider Notified 07/26/21  Time Provider Notified 2035  Notification Type Page  Notification Reason Change in status  Provider response See new orders  Date of Provider Response 07/26/21  Time of Provider Response 2037  Notify: Rapid Response  Name of Rapid Response RN Notified Anselm Pancoast, RN  Date Rapid Response Notified 07/26/21  Time Rapid Response Notified 2027  Document  Patient Outcome Not stable and remains on department  Progress note created (see row info) Yes

## 2021-07-27 NOTE — Progress Notes (Addendum)
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  Admitted for sepsis due to aspiration pneumonia.  Seen by speech therapist, has chronic dysphagia.  Patient has declined a dysphagia diet.  Seen by palliative care team, patient would like to go to rehab prior to going home with hospice care.  Hospital course complicated by a syncopal episode the evening of 07/26/2021 after receiving a dose of IV Lopressor 2.5 mg for SBP of 180.  BP was no detectable initially prior to 200 cc normal saline bolus.  Once his BP was stabilized his heart rate increased to 160, and sinus tachycardia.  Due to concern for arrhythmia induced syncope cardiology was consulted on 07/27/2021.  07/27/2021: Seen and examined with his daughter and granddaughter in the room.  Weak appearing but interactive.  No chest pain.  He states he is not coughing a lot, although rales noted on lungs auscultation bilaterally, likely 2/2 to persistent aspiration from chronic dysphagia.  Assessment/Plan: Principal Problem:   SIRS (systemic inflammatory response syndrome)  (HCC) Active Problems:   History of tongue cancer   Orthostatic hypotension   Hypothyroidism   MGUS (monoclonal gammopathy of unknown significance)   Dementia (HCC)   Pressure injury of skin  Sepsis secondary to aspiration pneumonia due to chronic dysphagia, POA Presented with fever T-max 102.1, heart rate 130, WBC 15, chest x-ray personally reviewed showing pulmonary infiltrates in the medial right lung base. Received IV vancomycin and cefepime, MRSA screen negative, DC'd IV vancomycin on 07/24/2021. Completed 5 days of IV cefepime on 07/26/2021. Procalcitonin is downtrending, 0.95 from 2.25. Continue pulmonary toilet Continue DuoNebs every 6 hours  Syncope, suspect secondary to arrhythmia Patient follows with Dr. Martinique, he has been evaluated with a Holter monitor in the past which did not show significant pauses or bradycardia. Last 2D echo was on 04/28/2021 which showed moderate asymmetric left ventricular hypertrophy of the basal septal segment.  There is abnormal septal motion.  Moderate aortic valve stenosis. Cardiology consulted on 07/27/2021.  Orthostatic hypotension Patient on Florinef 0.1 mg daily and pyridostigmine 60 mg 3 times daily which was added by neurology outpatient (Dr. Felecia Shelling) on 05/28/2021, continue Avoid aggressive treatment of his hypertension. Hold off IV Lopressor as needed for now, until seen by cardiology. Avoid dehydration Closely monitor on telemetry.  Acute hypoxic respiratory failure secondary to above Continue to maintain O2 saturation greater than 92%.   He is currently on 4 L nasal cannula. Continue to closely monitor in the setting of likely ongoing aspiration.  Refractory prolonged QTC Twelve-lead EKG done on 07/23/2021 with QTC greater than 550 Continue to optimize magnesium and potassium levels Continue to avoid QTC prolonging agents  CAD s/p CABG Denies any anginal symptoms at the time of this exam On ASA, Brilinta, statin  Right  carotid  artery occlusion On medical management ASA, Brilinta, statin  MGUS Follows with Dr. Alvy Bimler yearly Per medical oncology last note, his myeloma has been stable.  History of tongue cancer s/p radiation and chemotherapy with chronic dysphagia Seen by speech therapist Family made decision to continue diet despite ongoing aspiration Continue aspiration precautions  Hypothyroidism Continue home levothyroxine  History of CVA with right-sided carotid occlusion Continue aspirin, Brilinta and statin.  Polyneuropathy Continue gabapentin  Chronic anxiety disorder Continue p.o. Ativan  Memory loss He is on donepezil, continue  Goals of care Goals of care discussion started with palliative care team on 07/26/2021 Patient would like to go home with hospice care after his rehab admission at [facility].  Critical care time: 65 minutes.   Code Status: DNR  Family Communication: None at bedside  Disposition Plan: Undetermined at this time, CIR versus home with hospice care.   Consultants: Palliative care team Cardiology  Procedures: None  Antimicrobials: Cefepime, completed on 07/26/2021. IV vancomycin, DC'd on 07/24/2021.  DVT prophylaxis: Subcu Lovenox daily  Status is: Inpatient  Patient will require at least 2 midnights for further evaluation and treatment of present condition.        Objective: Vitals:   07/27/21 0242 07/27/21 0312 07/27/21 0342 07/27/21 0532  BP: 136/74 111/67 (!) 157/70 (!) 153/74  Pulse: 81  92 91  Resp: 20 19 18 19   Temp:    98.5 F (36.9 C)  TempSrc:    Oral  SpO2: 100%  100% 100%  Weight:      Height:        Intake/Output Summary (Last 24 hours) at 07/27/2021 0738 Last data filed at 07/27/2021 0600 Gross per 24 hour  Intake 533.8 ml  Output 500 ml  Net 33.8 ml   Filed Weights   07/22/21 2306 07/24/21 0345 07/24/21 0449  Weight: 77.1 kg 73.6 kg 73.6 kg    Exam:  General: 80 y.o. year-old male well-developed well-nourished in no  acute distress.  He is alert and interactive.   Cardiovascular: Regular rate and rhythm no rubs or gallops.  Respiratory: Diffuse rales bilaterally with poor inspiratory effort. Abdomen: Soft nontender normal bowel sounds present. Musculoskeletal: No lower extremity edema bilaterally.   Skin: No ulcerative lesions noted. Psychiatry: Mood is appropriate for condition and setting. Neuro: Alert and awake.   Data Reviewed: CBC: Recent Labs  Lab 07/22/21 1807 07/22/21 1843 07/23/21 0410 07/24/21 0518 07/26/21 2244  WBC 12.5*  --  15.4* 10.7* 11.8*  NEUTROABS 10.5*  --   --   --   --   HGB 12.8* 12.9* 11.0* 13.0 12.1*  HCT 39.2 38.0* 33.9* 39.1 36.2*  MCV 94.7  --  93.4 91.6 91.2  PLT 156  --  136* 153 774   Basic Metabolic Panel: Recent Labs  Lab 07/22/21 1807 07/22/21 1843 07/23/21 0410 07/25/21 0226 07/26/21 2244  NA 137 140 137 139 134*  K 4.4 3.8 4.2 3.5 3.1*  CL 102  --  103 103 100  CO2 26  --  27 28 23   GLUCOSE 104*  --  124* 100* 113*  BUN 20  --  16 21 29*  CREATININE 1.26*  --  1.09 1.11 1.25*  CALCIUM 9.1  --  8.8* 9.2 8.8*  MG  --   --   --   --  1.8   GFR: Estimated Creatinine Clearance: 49.1 mL/min (A) (by C-G formula based on SCr of 1.25 mg/dL (H)). Liver Function Tests: Recent Labs  Lab 07/22/21 1807 07/23/21 0410  AST 31 22  ALT 27 22  ALKPHOS 37* 28*  BILITOT 0.8 0.6  PROT 7.1 6.3*  ALBUMIN 3.4* 2.8*   No results for input(s): LIPASE, AMYLASE in the last 168 hours. No results for input(s): AMMONIA in the last 168 hours. Coagulation Profile: Recent Labs  Lab 07/22/21 1950  INR 1.1   Cardiac Enzymes: No results for input(s): CKTOTAL, CKMB, CKMBINDEX, TROPONINI in the last 168 hours. BNP (last 3 results) No results for input(s): PROBNP in the last 8760 hours. HbA1C: No results for input(s): HGBA1C in the last 72 hours. CBG: Recent Labs  Lab 07/26/21 2037  GLUCAP 117*   Lipid Profile: No results for input(s): CHOL, HDL, LDLCALC,  TRIG, CHOLHDL, LDLDIRECT in the last 72 hours. Thyroid Function Tests: No results for input(s): TSH, T4TOTAL, FREET4, T3FREE, THYROIDAB in the last 72 hours. Anemia Panel: No results for input(s): VITAMINB12, FOLATE, FERRITIN, TIBC, IRON, RETICCTPCT in the last 72 hours. Urine analysis:    Component Value Date/Time   COLORURINE YELLOW 07/22/2021 1807   APPEARANCEUR CLEAR 07/22/2021 1807   LABSPEC 1.011 07/22/2021 1807   LABSPEC 1.005 06/25/2011 1502   PHURINE 7.0 07/22/2021 1807   GLUCOSEU NEGATIVE 07/22/2021 1807   HGBUR NEGATIVE 07/22/2021 1807   BILIRUBINUR NEGATIVE 07/22/2021 1807   BILIRUBINUR Negative 06/25/2011 1502   KETONESUR 5 (A) 07/22/2021 1807   PROTEINUR NEGATIVE 07/22/2021 1807   UROBILINOGEN 0.2 10/09/2011 1634   NITRITE NEGATIVE 07/22/2021 1807   LEUKOCYTESUR NEGATIVE 07/22/2021 1807   LEUKOCYTESUR Negative 06/25/2011 1502   Sepsis Labs: @LABRCNTIP (procalcitonin:4,lacticidven:4)  ) Recent Results (from the past 240 hour(s))  Resp Panel by RT-PCR (Flu A&B, Covid) Nasopharyngeal Swab     Status: None   Collection Time: 07/22/21  6:16 PM   Specimen: Nasopharyngeal Swab; Nasopharyngeal(NP) swabs in vial transport medium  Result Value Ref Range Status   SARS Coronavirus 2 by RT PCR NEGATIVE NEGATIVE Final    Comment: (NOTE) SARS-CoV-2 target nucleic acids are NOT DETECTED.  The SARS-CoV-2 RNA is generally detectable in upper respiratory specimens during the acute phase of infection. The lowest concentration of SARS-CoV-2 viral copies this assay can detect is 138 copies/mL. A negative result does not preclude SARS-Cov-2 infection and should not be used as the sole basis for treatment or other patient management decisions. A negative result may occur with  improper specimen collection/handling, submission of specimen other than nasopharyngeal swab, presence of viral mutation(s) within the areas targeted by this assay, and inadequate number of viral copies(<138  copies/mL). A negative result must be combined with clinical observations, patient history, and epidemiological information. The expected result is Negative.  Fact Sheet for Patients:  EntrepreneurPulse.com.au  Fact Sheet for Healthcare Providers:  IncredibleEmployment.be  This test is no t yet approved or cleared by the [place] FDA and  has been authorized for detection and/or diagnosis of SARS-CoV-2 by FDA under an Emergency Use Authorization (EUA). This EUA will remain  in effect (meaning this test can be used) for the duration of the COVID-19 declaration under Section 564(b)(1) of the Act, 21 U.S.C.section 360bbb-3(b)(1), unless the authorization is terminated  or revoked sooner.       Influenza A by PCR NEGATIVE NEGATIVE Final   Influenza B by PCR NEGATIVE NEGATIVE Final    Comment: (NOTE) The Xpert Xpress SARS-CoV-2/FLU/RSV plus assay is intended as an aid in the diagnosis of influenza from Nasopharyngeal swab specimens and should not be used as a sole basis for  treatment. Nasal washings and aspirates are unacceptable for Xpert Xpress SARS-CoV-2/FLU/RSV testing.  Fact Sheet for Patients: EntrepreneurPulse.com.au  Fact Sheet for Healthcare Providers: IncredibleEmployment.be  This test is not yet approved or cleared by the [place] FDA and has been authorized for detection and/or diagnosis of SARS-CoV-2 by FDA under an Emergency Use Authorization (EUA). This EUA will remain in effect (meaning this test can be used) for the duration of the COVID-19 declaration under Section 564(b)(1) of the Act, 21 U.S.C. section 360bbb-3(b)(1), unless the authorization is terminated or revoked.  Performed at [facility], [place] [address], [place], [place] [address]   Culture, blood (Routine x 2)     Status: None (Preliminary result)   Collection Time: 07/22/21  6:38 PM   Specimen: BLOOD  Result  Value Ref Range Status   Specimen Description BLOOD LEFT ANTECUBITAL  Final   Special Requests   Final    BOTTLES DRAWN AEROBIC AND ANAEROBIC Blood Culture results may not be optimal due to an inadequate volume of blood received in culture bottles   Culture   Final    NO GROWTH 4 DAYS Performed at [facility], [place] [address], [place], [place] [address]    Report Status PENDING  Incomplete  Urine Culture     Status: None   Collection Time: 07/22/21  8:38 PM   Specimen: In/Out Cath Urine  Result Value Ref Range Status   Specimen Description IN/OUT CATH URINE  Final   Special Requests NONE  Final   Culture   Final    NO GROWTH Performed at [facility], [place] [address] [place], [place] [address]    Report Status 07/23/2021 FINAL  Final  MRSA Next Gen by PCR, Nasal     Status: None   Collection Time: 07/24/21 10:36 AM   Specimen: Nasal Mucosa; Nasal Swab  Result Value Ref Range Status   MRSA by PCR Next Gen NOT DETECTED NOT DETECTED Final    Comment: (NOTE) The GeneXpert MRSA Assay (FDA approved for NASAL specimens only), is one component of a comprehensive MRSA colonization surveillance program. It is not intended to diagnose MRSA infection nor to guide or monitor treatment for MRSA infections. Test performance is not FDA approved in patients less than 67 years old. Performed at [facility], [place] [address], [place], [place] [address]       Studies: CT HEAD WO CONTRAST (5MM)  Result Date: 07/27/2021 CLINICAL DATA:  Left lower extremity weakness EXAM: CT HEAD WITHOUT CONTRAST TECHNIQUE: Contiguous axial images were obtained from the base of the skull through the vertex without intravenous contrast. COMPARISON:  None. FINDINGS: Brain: There is no mass, hemorrhage or extra-axial collection. The size and configuration of the ventricles and extra-axial CSF spaces are normal. The brain parenchyma is normal, without acute or chronic infarction. Vascular: No abnormal  hyperdensity of the major intracranial arteries or dural venous sinuses. No intracranial atherosclerosis. Skull: The visualized skull base, calvarium and extracranial soft tissues are normal. Sinuses/Orbits: No fluid levels or advanced mucosal thickening of the visualized paranasal sinuses. No mastoid or middle ear effusion. The orbits are normal. IMPRESSION: Normal head CT. Electronically Signed   By: Ulyses Jarred M.D.   On: 07/27/2021 03:19   DG CHEST PORT 1 VIEW  Result Date: 07/26/2021 CLINICAL DATA:  Syncopal episode. EXAM: PORTABLE CHEST 1 VIEW COMPARISON:  Portable chest 07/23/2021 FINDINGS: The cardiac size is normal. There are median sternotomy sutures again noted. The  aorta is tortuous and calcified. no vascular congestion is seen. The lungs show mild chronic changes without focal pneumonia with interval clearance of prior right infrahilar opacity which was probably atelectasis. Osteopenia and thoracic spondylosis. IMPRESSION: No evidence of acute chest disease.  Chronic change. Electronically Signed   By: Telford Nab M.D.   On: 07/26/2021 22:41    Scheduled Meds:  aspirin EC  81 mg Oral QHS   donepezil  5 mg Oral QHS   enoxaparin (LOVENOX) injection  40 mg Subcutaneous QHS   feeding supplement  237 mL Oral BID BM   fludrocortisone  0.1 mg Oral Daily   gabapentin  900 mg Oral TID   guaiFENesin  1,200 mg Oral BID   levothyroxine  100 mcg Oral QAC breakfast   LORazepam  1 mg Oral TID   pilocarpine  7.5 mg Oral TID   pyridostigmine  60 mg Oral TID   rosuvastatin  20 mg Oral QHS   sodium chloride HYPERTONIC  4 mL Nebulization Daily   ticagrelor  90 mg Oral BID    Continuous Infusions:  lactated ringers 75 mL/hr at 07/26/21 2246     LOS: 4 days     Kayleen Memos, MD [facility] Pager [phone]  If 7PM-7AM, please contact night-coverage [email or web address] Password [facility] 07/27/2021, 7:38 AM

## 2021-07-27 NOTE — Progress Notes (Signed)
   07/26/21 2218  Assess: MEWS Score  Temp (!) 102.8 F (39.3 C)  Assess: MEWS Score  MEWS Temp 2  MEWS Systolic 0  MEWS Pulse 2  MEWS RR 2  MEWS LOC 1  MEWS Score 7  MEWS Score Color Red  Assess: if the MEWS score is Yellow or Red  Were vital signs taken at a resting state? Yes  Focused Assessment Change from prior assessment (see assessment flowsheet) (More alert and following commands)  Early Detection of Sepsis Score *See Row Information* High  MEWS guidelines implemented *See Row Information* Yes  Treat  MEWS Interventions Escalated (See documentation below)  Pain Scale 0-10  Pain Score 0  Patients Stated Pain Goal 0  Take Vital Signs  Increase Vital Sign Frequency  Red: Q 1hr X 4 then Q 4hr X 4, if remains red, continue Q 4hrs  Escalate  MEWS: Escalate Red: discuss with charge nurse/RN and provider, consider discussing with RRT  Notify: Charge Nurse/RN  Name of Charge Nurse/RN Notified Glenna Durand, RN  Date Charge Nurse/RN Notified 07/26/21  Time Charge Nurse/RN Notified 2218  Notify: Provider  Provider Name/Title Dr. Velia Meyer  Date Provider Notified 07/26/21  Time Provider Notified 2218  Notification Type Page  Notification Reason Change in status (fever, hypotension)  Provider response See new orders  Date of Provider Response 07/26/21  Time of Provider Response 2220  Document  Patient Outcome Stabilized after interventions  Progress note created (see row info) Yes

## 2021-07-28 ENCOUNTER — Inpatient Hospital Stay (HOSPITAL_COMMUNITY): Payer: Medicare Other

## 2021-07-28 DIAGNOSIS — I951 Orthostatic hypotension: Secondary | ICD-10-CM | POA: Diagnosis not present

## 2021-07-28 DIAGNOSIS — R55 Syncope and collapse: Secondary | ICD-10-CM

## 2021-07-28 DIAGNOSIS — R6510 Systemic inflammatory response syndrome (SIRS) of non-infectious origin without acute organ dysfunction: Secondary | ICD-10-CM | POA: Diagnosis not present

## 2021-07-28 LAB — BASIC METABOLIC PANEL
Anion gap: 9 (ref 5–15)
BUN: 18 mg/dL (ref 8–23)
CO2: 25 mmol/L (ref 22–32)
Calcium: 8.6 mg/dL — ABNORMAL LOW (ref 8.9–10.3)
Chloride: 102 mmol/L (ref 98–111)
Creatinine, Ser: 0.89 mg/dL (ref 0.61–1.24)
GFR, Estimated: >60 mL/min (ref >60)
Glucose, Bld: 97 mg/dL (ref 70–99)
Potassium: 3.3 mmol/L — ABNORMAL LOW (ref 3.5–5.1)
Sodium: 136 mmol/L (ref 135–145)

## 2021-07-28 LAB — GLUCOSE, CAPILLARY: Glucose-Capillary: 130 mg/dL — ABNORMAL HIGH (ref 70–99)

## 2021-07-28 MED ORDER — POTASSIUM CHLORIDE CRYS ER 20 MEQ PO TBCR
20.0000 meq | EXTENDED_RELEASE_TABLET | Freq: Once | ORAL | Status: AC
  Administered 2021-07-28: 20 meq via ORAL
  Filled 2021-07-28: qty 1

## 2021-07-28 MED ORDER — LACTATED RINGERS IV SOLN
INTRAVENOUS | Status: DC
Start: 2021-07-28 — End: 2021-07-30

## 2021-07-28 MED ORDER — IOHEXOL 350 MG/ML SOLN
75.0000 mL | Freq: Once | INTRAVENOUS | Status: AC | PRN
  Administered 2021-07-28: 75 mL via INTRAVENOUS

## 2021-07-28 MED ORDER — SODIUM CHLORIDE 0.9 % IV SOLN
3.0000 g | Freq: Four times a day (QID) | INTRAVENOUS | Status: DC
  Administered 2021-07-28 – 2021-07-31 (×13): 3 g via INTRAVENOUS
  Filled 2021-07-28 (×17): qty 8

## 2021-07-28 MED ORDER — SENNOSIDES-DOCUSATE SODIUM 8.6-50 MG PO TABS
2.0000 | ORAL_TABLET | Freq: Every day | ORAL | Status: DC
  Administered 2021-07-28 – 2021-07-31 (×4): 2 via ORAL
  Filled 2021-07-28 (×4): qty 2

## 2021-07-28 NOTE — Progress Notes (Signed)
Pharmacy Antibiotic Note  George Bradley is a 80 y.o. male admitted on 07/22/2021 with sepsis. Patient presented from home with generalized weakness (hx dementia), and received IV Vancomycin and Cefepime.   On 11/6, patient now febrile with tachycardia and tachypnea likely secondary to recurrent aspiration pneumonia. Pharmacy has been consulted for Unasyn dosing.  Plan: Start Unasyn 3g IV q6h.  F/u clinical status, cultures, LOT, abx plan.   Height: 6\' 1"  (185.4 cm) Weight: 73.6 kg (162 lb 4.1 oz) IBW/kg (Calculated) : 79.9  Temp (24hrs), Avg:99.3 F (37.4 C), Min:98.5 F (36.9 C), Max:102.9 F (39.4 C)  Recent Labs  Lab 07/22/21 1807 07/22/21 2114 07/23/21 0410 07/24/21 0518 07/25/21 0226 07/26/21 2244 07/28/21 0113  WBC 12.5*  --  15.4* 10.7*  --  11.8*  --   CREATININE 1.26*  --  1.09  --  1.11 1.25* 0.89  LATICACIDVEN 1.8 1.7  --   --   --   --   --     Estimated Creatinine Clearance: 68.9 mL/min (by C-G formula based on SCr of 0.89 mg/dL).    Allergies  Allergen Reactions   Doxepin Other (See Comments)    Left-sided weakness appeared the morning after this was first taken   Zetia [Ezetimibe] Other (See Comments)    "made me feel bad"    Antimicrobials this admission: Metronidazole 10/31  Vancomycin 10/31 >> 11/1 Cefepime 10/31 >> 11/4 Unasyn 11/6 >>  Dose adjustments this admission: N/A  Microbiology results: 11/6 BCx: ordered, needs to be collected 10/31 BCx: ngtd 10/31 UCx: negative  11/2 MRSA PCR: not detected   Thank you for allowing pharmacy to be a part of this patient's care.  Vance Peper, PharmD PGY1 Pharmacy Resident Phone [phone] 07/28/2021 11:45 AM   Please check AMION for all [place] phone numbers After 10:00 PM, call [place] [phone]

## 2021-07-28 NOTE — Progress Notes (Signed)
Inpatient Rehab Admissions Coordinator:  Physiatrist to hold pt's admission to CIR. Pt not medically stable. Will continue to follow.   Gayland Curry, [place], [place] Admissions Coordinator [phone]

## 2021-07-28 NOTE — Progress Notes (Signed)
Primary Cardiologist:  Dr Martinique   Subjective:  Denies SSCP, palpitations or Dyspnea   Objective:  Vitals:   07/27/21 2045 07/28/21 0017 07/28/21 0447 07/28/21 0714  BP: (!) 167/87 (!) 183/97 (!) 151/72 (!) 161/89  Pulse: 100 92 92 98  Resp: 17 16 20 20   Temp: 99.1 F (37.3 C) 98.7 F (37.1 C) 99 F (37.2 C) 98.7 F (37.1 C)  TempSrc: Oral Oral Oral Oral  SpO2: 97% 96% (!) 89% 93%  Weight:      Height:        Intake/Output from previous day:  Intake/Output Summary (Last 24 hours) at 07/28/2021 0754 Last data filed at 07/28/2021 0600 Gross per 24 hour  Intake 1067.26 ml  Output 1600 ml  Net -532.74 ml    Physical Exam: Frail elderly male Lungs clear AS murmur  BS positive  No edema   Lab Results: Basic Metabolic Panel: Recent Labs    07/26/21 2244 07/27/21 1133 07/28/21 0113  NA 134*  --  136  K 3.1* 4.0 3.3*  CL 100  --  102  CO2 23  --  25  GLUCOSE 113*  --  97  BUN 29*  --  18  CREATININE 1.25*  --  0.89  CALCIUM 8.8*  --  8.6*  MG 1.8  --   --    Liver Function Tests: No results for input(s): AST, ALT, ALKPHOS, BILITOT, PROT, ALBUMIN in the last 72 hours. No results for input(s): LIPASE, AMYLASE in the last 72 hours. CBC: Recent Labs    07/26/21 2244  WBC 11.8*  HGB 12.1*  HCT 36.2*  MCV 91.2  PLT 165     Imaging: CT HEAD WO CONTRAST (5MM)  Result Date: 07/27/2021 CLINICAL DATA:  Left lower extremity weakness EXAM: CT HEAD WITHOUT CONTRAST TECHNIQUE: Contiguous axial images were obtained from the base of the skull through the vertex without intravenous contrast. COMPARISON:  None. FINDINGS: Brain: There is no mass, hemorrhage or extra-axial collection. The size and configuration of the ventricles and extra-axial CSF spaces are normal. The brain parenchyma is normal, without acute or chronic infarction. Vascular: No abnormal hyperdensity of the major intracranial arteries or dural venous sinuses. No intracranial atherosclerosis. Skull:  The visualized skull base, calvarium and extracranial soft tissues are normal. Sinuses/Orbits: No fluid levels or advanced mucosal thickening of the visualized paranasal sinuses. No mastoid or middle ear effusion. The orbits are normal. IMPRESSION: Normal head CT. Electronically Signed   By: Ulyses Jarred M.D.   On: 07/27/2021 03:19   DG CHEST PORT 1 VIEW  Result Date: 07/26/2021 CLINICAL DATA:  Syncopal episode. EXAM: PORTABLE CHEST 1 VIEW COMPARISON:  Portable chest 07/23/2021 FINDINGS: The cardiac size is normal. There are median sternotomy sutures again noted. The aorta is tortuous and calcified. no vascular congestion is seen. The lungs show mild chronic changes without focal pneumonia with interval clearance of prior right infrahilar opacity which was probably atelectasis. Osteopenia and thoracic spondylosis. IMPRESSION: No evidence of acute chest disease.  Chronic change. Electronically Signed   By: Telford Nab M.D.   On: 07/26/2021 22:41    Cardiac Studies:  ECG: The EKG was personally reviewed and demonstrates:   - EKG from 07/22/2021 at 17:58: Sinus tachycardia, rate 119 bpm,,with 1st degree AV block, RBBB, and multiple PVC. No acute ST/T changes. QTc 557 ms. - EKG from 07/22/2021 at 18:26: Atrial fibrillation, rate 126 bpm, with multiple PVCs. No acute ST/T changes.  5 EKGs  Telemetry: SR PAC/PVC no significant NSVT AV block or PAF   Echo: Echocardiogram 04/28/2021: Impressions:  1. Left ventricular ejection fraction, by estimation, is 50 to 55%. The  left ventricle has low normal function. The left ventricle has no regional  wall motion abnormalities. There is moderate asymmetric left ventricular  hypertrophy of the basal-septal  segment. Left ventricular diastolic parameters are consistent with Grade I  diastolic dysfunction (impaired relaxation). There is abnormal septal  motion.   2. Right ventricular systolic function is mildly reduced. The right  ventricular size is normal.    3. Left atrial size was mildly dilated.   4. The mitral valve is normal in structure. Trivial mitral valve  regurgitation. No evidence of mitral stenosis.   5. The aortic valve is abnormal. There is severe calcifcation of the  aortic valve. Aortic valve regurgitation is mild. Moderate aortic valve  stenosis. Aortic valve mean gradient measures 21.0 mmHg.   Conclusion(s)/Recommendation(s): No intracardiac source of embolism  detected on this transthoracic study.   Medications:    aspirin EC  81 mg Oral QHS   diclofenac Sodium  4 g Topical QID   donepezil  5 mg Oral QHS   enoxaparin (LOVENOX) injection  40 mg Subcutaneous QHS   feeding supplement  1 Container Oral TID BM   fludrocortisone  0.1 mg Oral Daily   gabapentin  900 mg Oral TID   levothyroxine  100 mcg Oral QAC breakfast   pilocarpine  7.5 mg Oral TID   pyridostigmine  60 mg Oral TID   rosuvastatin  20 mg Oral QHS   ticagrelor  90 mg Oral BID      lactated ringers 50 mL/hr at 07/28/21 0448    Assessment/Plan:   Syncope -Non cardiac related to age and autonomic dysfunction with postural changes No arrhythmic etiology Avoiding beta blocker for isolated ectopy as he is being Rx with myesthenia meds Tolerate systolic HTN in 321-224 mmHg    CAD - S/p CABG x4 in 2000. Last cath in 2018 showed patent grafts.  - No angina.  - On DAPT with Aspirin and Brilinta for CVA. - Continue high-intensity statin.   Elevated Troponin  - High-sensitivity troponin mildly elevated at 19 >> 40 >> 68. - No acute ischemic changes on EKG. - No chest pain. - Suspect demand ischemia in setting of acute illness. No ischemic work-up necessary at this time.   Hyperlipidemia - Continue Crestor 20mg  daily.   Prolonged Qtc - QTc 543 on EKG yesterday  However, he does have known RBBB with QRS interval of 130 ms so corrected more normal range  - Potassium 3.3 today. Supplemented by primary team - Magnesium within normal limits at 1.8. -  Avoid QT prolonging medications.   Otherwise, per primary team: - Sepsis due to recurrent aspiration pneumonia - Acute hypoxic respiratory failure secondary to aspiration pneumonia,  - CVA with right carotid artery occlusion: on DAPT with Aspirin and Brilinta - Hypothyroidism - Dementia - History of tongue cancer  Jenkins Rouge 07/28/2021, 7:54 AM

## 2021-07-28 NOTE — Progress Notes (Signed)
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  Admitted for sepsis due to aspiration pneumonia.  Seen by speech therapist, has chronic dysphagia.  Patient has declined a dysphagia diet.  Seen by palliative care team, patient would like to go to rehab prior to going home with hospice care.  Hospital course complicated by a syncopal episode the evening of 07/26/2021 after receiving a dose of IV Lopressor 2.5 mg for SBP of 180.  BP was no detectable initially prior to 200 cc normal saline bolus.  Once his BP was stabilized his heart rate increased to 160, and sinus tachycardia.  Due to concern for arrhythmia induced syncope cardiology was consulted on 07/27/2021.  Seen by cardiology, syncope was likely not related to an arrhythmia.  Patient had a recurrent syncopal episode while he was laying supine independent on 07/28/2021.  He appeared postictal afterwards.  EEG ordered.  Neurology consulted to assist with the management.  Discussed case with Dr. Rory Percy, recommended CTA head and neck to evaluate his right coronary artery stenosis and  other possible etiologies, MRI brain to rule out any small acute strokes.  Neurology will see in consultation.  07/28/2021: Patient was seen and examined at bedside.  At the time of this visit, the patient is alert and oriented.  He is answering questions appropriately however he tells me that he is not feeling quite well.  Reports pain in his left hip.  Assessment/Plan: Principal Problem:   SIRS (systemic inflammatory response syndrome) (HCC) Active Problems:   History of tongue cancer   Orthostatic hypotension   Hypothyroidism   MGUS (monoclonal gammopathy of unknown significance)   Dementia (HCC)   Pressure injury of skin   Syncope  Sepsis secondary to recurrent aspiration pneumonia due to chronic dysphagia, POA Presented with fever T-max 102.1, heart rate 130, WBC 15, chest x-ray personally reviewed showing pulmonary infiltrates in the medial right lung base. Received IV vancomycin and cefepime, MRSA screen negative, DC'd IV vancomycin on 07/24/2021. Completed 5 days of IV cefepime on 07/26/2021. Febrile on 07/28/2021 with T-max 102.9, also tachycardic with heart rate 112, tachypneic with respiration rate 26. Restarted IV antibiotics Unasyn on 07/28/2021. Discussed alternative for feedings with the patient's wife via phone, she would like to think about it prior to making a decision. Continue pulmonary toilet Continue DuoNebs every 6 hours  Recurrent syncope, arrhythmia was ruled out, possibly contributed by right carotid artery stenosis. Patient follows with Dr. Martinique, he has been evaluated with a Holter monitor in the past which did not show significant pauses or bradycardia. Last 2D echo was on 04/28/2021 which showed moderate asymmetric left ventricular hypertrophy of the basal septal segment.  There is abnormal septal motion.  Moderate aortic valve stenosis. Cardiology consulted on 07/27/2021, per cardiology, syncope is  less likely secondary to arrhythmia. Patient has significant  right carotid artery stenosis, discussed with neurology, Dr. Rory Percy, recommended CTA head and neck as well as MRI brain. EEG ordered, follow.  Right carotid artery occlusion, followed by neurology outpatient. On medical management ASA, Brilinta, statin Follow CTA head and neck  Chronic dysphagia with recurrent aspiration Keep n.p.o. for now Discussed alternative to nutrition with his wife via phone, she would like to think about it prior to making a decision.  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 Felecia Shelling) on 05/28/2021 He is also on pilocarpine 7.5 mg 3 times daily. Avoid aggressive treatment of his hypertension. IV Lopressor as needed has been discontinued Avoid dehydration Continue to closely monitor on telemetry.  Acute hypoxic respiratory failure secondary to recurrent aspiration pneumonia. Continue to maintain O2 saturation greater than 92%.   He is currently on 4 L nasal cannula. Continue to closely monitor in the setting of likely ongoing aspiration.  Refractory prolonged QTC Twelve-lead EKG done on 07/23/2021 with QTC greater than 550 Continue to optimize magnesium and potassium levels Continue to avoid QTC prolonging agents  CAD s/p CABG Denies any anginal symptoms at the time of this exam Currently on ASA, Brilinta, statin  MGUS Follows with Dr. Alvy Bimler yearly Per medical oncology last note, his myeloma has been stable.  History of tongue cancer s/p radiation and chemotherapy with chronic dysphagia Seen by speech therapist Continue aspiration precautions  Hypothyroidism Continue home levothyroxine  History of CVA with right-sided carotid occlusion Continue aspirin, Brilinta and statin.  Polyneuropathy Continue gabapentin  Chronic anxiety disorder Continue p.o. Ativan  Memory loss/mild dementia He is on donepezil,  continue  Goals of care Goals of care discussion started with palliative care team on 07/26/2021 Patient would like to go home with hospice care after his rehab admission at [facility].  Critical care time: 65 minutes.     Code Status: DNR  Family Communication: Updated his wife via phone on 07/28/2021.  Disposition Plan: Undetermined at this time, CIR versus home with hospice care.   Consultants: Palliative care team Cardiology Neurology.  Procedures: None  Antimicrobials: Cefepime, completed on 07/26/2021. IV vancomycin, DC'd on 07/24/2021. IV Unasyn started on 07/28/2021.  DVT prophylaxis: Subcu Lovenox daily  Status is: Inpatient  Patient will require at least 2 midnights for further evaluation and treatment of present condition.        Objective: Vitals:   07/28/21 0447 07/28/21 0714 07/28/21 1005 07/28/21 1010  BP: (!) 151/72 (!) 161/89 (!) 147/88 (!) 152/84  Pulse: 92 98 (!) 110 (!) 111  Resp: 20 20 (!) 26 (!) 21  Temp: 99 F (37.2 C) 98.7 F (37.1 C)  (!) 102.9 F (39.4 C)  TempSrc: Oral Oral  Oral  SpO2: (!) 89% 93% 97% 98%  Weight:      Height:        Intake/Output Summary (Last 24 hours) at 07/28/2021 1114 Last data filed at 07/28/2021 0600 Gross per 24 hour  Intake 751.01 ml  Output 1600 ml  Net -848.99 ml   Filed Weights   07/22/21 2306 07/24/21 0345 07/24/21 0449  Weight: 77.1 kg 73.6 kg 73.6 kg    Exam:  General: 80 y.o. year-old male well-developed well-nourished in no acute stress.  He is alert and interactive prior to recurrent syncopal episode. Cardiovascular: Regular rate and rhythm no rubs or gallops.  Respiratory: Diffuse  rales bilaterally with poor inspiratory effort. Abdomen: Soft nontender normal bowel sounds present.  Musculoskeletal: No lower extremity edema bilaterally. Skin: No ulcerative lesions noted.   Psychiatry: Mood is appropriate for condition and setting. Neuro: Alert and awake at the time of this visit, prior to  syncopal episode.   Data Reviewed: CBC: Recent Labs  Lab 07/22/21 1807 07/22/21 1843 07/23/21 0410 07/24/21 0518 07/26/21 2244  WBC 12.5*  --  15.4* 10.7* 11.8*  NEUTROABS 10.5*  --   --   --   --   HGB 12.8* 12.9* 11.0* 13.0 12.1*  HCT 39.2 38.0* 33.9* 39.1 36.2*  MCV 94.7  --  93.4 91.6 91.2  PLT 156  --  136* 153 326   Basic Metabolic Panel: Recent Labs  Lab 07/22/21 1807 07/22/21 1843 07/23/21 0410 07/25/21 0226 07/26/21 2244 07/27/21 1133 07/28/21 0113  NA 137 140 137 139 134*  --  136  K 4.4 3.8 4.2 3.5 3.1* 4.0 3.3*  CL 102  --  103 103 100  --  102  CO2 26  --  27 28 23   --  25  GLUCOSE 104*  --  124* 100* 113*  --  97  BUN 20  --  16 21 29*  --  18  CREATININE 1.26*  --  1.09 1.11 1.25*  --  0.89  CALCIUM 9.1  --  8.8* 9.2 8.8*  --  8.6*  MG  --   --   --   --  1.8  --   --    GFR: Estimated Creatinine Clearance: 68.9 mL/min (by C-G formula based on SCr of 0.89 mg/dL). Liver Function Tests: Recent Labs  Lab 07/22/21 1807 07/23/21 0410  AST 31 22  ALT 27 22  ALKPHOS 37* 28*  BILITOT 0.8 0.6  PROT 7.1 6.3*  ALBUMIN 3.4* 2.8*   No results for input(s): LIPASE, AMYLASE in the last 168 hours. No results for input(s): AMMONIA in the last 168 hours. Coagulation Profile: Recent Labs  Lab 07/22/21 1950  INR 1.1   Cardiac Enzymes: No results for input(s): CKTOTAL, CKMB, CKMBINDEX, TROPONINI in the last 168 hours. BNP (last 3 results) No results for input(s): PROBNP in the last 8760 hours. HbA1C: No results for input(s): HGBA1C in the last 72 hours. CBG: Recent Labs  Lab 07/26/21 2037 07/28/21 1011  GLUCAP 117* 130*   Lipid Profile: No results for input(s): CHOL, HDL, LDLCALC, TRIG, CHOLHDL, LDLDIRECT in the last 72 hours. Thyroid Function Tests: No results for input(s): TSH, T4TOTAL, FREET4, T3FREE, THYROIDAB in the last 72 hours. Anemia Panel: No results for input(s): VITAMINB12, FOLATE, FERRITIN, TIBC, IRON, RETICCTPCT in the last 72  hours. Urine analysis:    Component Value Date/Time   COLORURINE YELLOW 07/22/2021 1807   APPEARANCEUR CLEAR 07/22/2021 1807   LABSPEC 1.011 07/22/2021 1807   LABSPEC 1.005 06/25/2011 1502   PHURINE 7.0 07/22/2021 1807   GLUCOSEU NEGATIVE 07/22/2021 1807   HGBUR NEGATIVE 07/22/2021 1807   BILIRUBINUR NEGATIVE 07/22/2021 1807   BILIRUBINUR Negative 06/25/2011 1502   KETONESUR 5 (A) 07/22/2021 1807   PROTEINUR NEGATIVE 07/22/2021 1807   UROBILINOGEN 0.2 10/09/2011 1634   NITRITE NEGATIVE 07/22/2021 1807   LEUKOCYTESUR NEGATIVE 07/22/2021 1807   LEUKOCYTESUR Negative 06/25/2011 1502   Sepsis Labs: @LABRCNTIP (procalcitonin:4,lacticidven:4)  ) Recent Results (from the past 240 hour(s))  Resp Panel by RT-PCR (Flu A&B, Covid) Nasopharyngeal Swab     Status: None   Collection Time: 07/22/21  6:16 PM  Specimen: Nasopharyngeal Swab; Nasopharyngeal(NP) swabs in vial transport medium  Result Value Ref Range Status   SARS Coronavirus 2 by RT PCR NEGATIVE NEGATIVE Final    Comment: (NOTE) SARS-CoV-2 target nucleic acids are NOT DETECTED.  The SARS-CoV-2 RNA is generally detectable in upper respiratory specimens during the acute phase of infection. The lowest concentration of SARS-CoV-2 viral copies this assay can detect is 138 copies/mL. A negative result does not preclude SARS-Cov-2 infection and should not be used as the sole basis for treatment or other patient management decisions. A negative result may occur with  improper specimen collection/handling, submission of specimen other than nasopharyngeal swab, presence of viral mutation(s) within the areas targeted by this assay, and inadequate number of viral copies(<138 copies/mL). A negative result must be combined with clinical observations, patient history, and epidemiological information. The expected result is Negative.  Fact Sheet for Patients:  EntrepreneurPulse.com.au  Fact Sheet for Healthcare  Providers:  IncredibleEmployment.be  This test is no t yet approved or cleared by the [place] FDA and  has been authorized for detection and/or diagnosis of SARS-CoV-2 by FDA under an Emergency Use Authorization (EUA). This EUA will remain  in effect (meaning this test can be used) for the duration of the COVID-19 declaration under Section 564(b)(1) of the Act, 21 U.S.C.section 360bbb-3(b)(1), unless the authorization is terminated  or revoked sooner.       Influenza A by PCR NEGATIVE NEGATIVE Final   Influenza B by PCR NEGATIVE NEGATIVE Final    Comment: (NOTE) The Xpert Xpress SARS-CoV-2/FLU/RSV plus assay is intended as an aid in the diagnosis of influenza from Nasopharyngeal swab specimens and should not be used as a sole basis for treatment. Nasal washings and aspirates are unacceptable for Xpert Xpress SARS-CoV-2/FLU/RSV testing.  Fact Sheet for Patients: EntrepreneurPulse.com.au  Fact Sheet for Healthcare Providers: IncredibleEmployment.be  This test is not yet approved or cleared by the [place] FDA and has been authorized for detection and/or diagnosis of SARS-CoV-2 by FDA under an Emergency Use Authorization (EUA). This EUA will remain in effect (meaning this test can be used) for the duration of the COVID-19 declaration under Section 564(b)(1) of the Act, 21 U.S.C. section 360bbb-3(b)(1), unless the authorization is terminated or revoked.  Performed at [facility], [place] [address], [place], [place] [address]   Culture, blood (Routine x 2)     Status: None   Collection Time: 07/22/21  6:38 PM   Specimen: BLOOD  Result Value Ref Range Status   Specimen Description BLOOD LEFT ANTECUBITAL  Final   Special Requests   Final    BOTTLES DRAWN AEROBIC AND ANAEROBIC Blood Culture results may not be optimal due to an inadequate volume of blood received in culture bottles   Culture   Final    NO GROWTH  5 DAYS Performed at [facility], [place] [address], [place], [place] [address]    Report Status 07/27/2021 FINAL  Final  Urine Culture     Status: None   Collection Time: 07/22/21  8:38 PM   Specimen: In/Out Cath Urine  Result Value Ref Range Status   Specimen Description IN/OUT CATH URINE  Final   Special Requests NONE  Final   Culture   Final    NO GROWTH Performed at [facility], [place] [address], [place], [place] [address]    Report Status 07/23/2021 FINAL  Final  MRSA Next Gen by PCR, Nasal     Status: None  Collection Time: 07/24/21 10:36 AM   Specimen: Nasal Mucosa; Nasal Swab  Result Value Ref Range Status   MRSA by PCR Next Gen NOT DETECTED NOT DETECTED Final    Comment: (NOTE) The GeneXpert MRSA Assay (FDA approved for NASAL specimens only), is one component of a comprehensive MRSA colonization surveillance program. It is not intended to diagnose MRSA infection nor to guide or monitor treatment for MRSA infections. Test performance is not FDA approved in patients less than 75 years old. Performed at [facility], [place] [address] [place], [place] [address]       Studies: No results found.  Scheduled Meds:  aspirin EC  81 mg Oral QHS   diclofenac Sodium  4 g Topical QID   donepezil  5 mg Oral QHS   enoxaparin (LOVENOX) injection  40 mg Subcutaneous QHS   feeding supplement  1 Container Oral TID BM   fludrocortisone  0.1 mg Oral Daily   gabapentin  900 mg Oral TID   levothyroxine  100 mcg Oral QAC breakfast   pilocarpine  7.5 mg Oral TID   pyridostigmine  60 mg Oral TID   rosuvastatin  20 mg Oral QHS   ticagrelor  90 mg Oral BID    Continuous Infusions:  lactated ringers 50 mL/hr at 07/28/21 0448     LOS: 5 days     Kayleen Memos, MD [facility] Pager [phone]  If 7PM-7AM, please contact night-coverage [email or web address] Password [email or web address] 07/28/2021, 11:14 AM

## 2021-07-28 NOTE — Progress Notes (Signed)
Pt's wife is requesting to advance pt diet from NPO status, which she requested Dr. [place] Crane in the morning to keep pt in. RN notified Dr. [place] Crane. However, Speech therapist has already seen pt and recommended dysphagia 3 diet during this admission, which pt had declined earlier. RN will try to educate pt about the risk of Aspiration Pneumonia if pt does not choose right kind of diet.
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
## 2021-07-28 NOTE — Procedures (Signed)
[facility]  Routine EEG Report  Patient Name:   George Bradley, George Bradley Date of Birth:   04-02-1941 Identification Number:   MRN - [id number]  Date of Study:   07/28/2021 17:29:18  Indication: Encephalopathy,  Technical Summary: A routine 20 channel electroencephalogram using the international 10-20 system of electrode placement was performed.  Background: 5-6 Hz, Poorly formed  States       Awake      Drowsy: were seen during drowsiness  Abnormalities  Generalized Slowing: Diffuse generalized slowing Background Slowing: The background consists of 20-50 uV, 5-6 Hz diffuse activity with superimposed diffuse polymorphic delta activity that is non reactive to external stimulation. GPDs: Occasional 1 hz generalized sharp wave discharges with triphasic morphology. Occasionally represented better lateralized but favored to represent generalized pathology.   Activation Procedures  Hyperventilation: Not performed  Photic Stimulation: Not performed  Classification: Abnormal :  Diagnosis: This is abnormal EEG. The presence of mild diffuse slowing and intermixed generalized triphasic waves is consistent with nonspecific generalized neuronal dysfunction and can be seen with metabolic encephalopathy. No seizures are noted.      Dr Apolinar Junes   [facility] [phone]  Case [id number]

## 2021-07-28 NOTE — Progress Notes (Signed)
EEG done at bedside. Imped= good. No skin breakdown seen. Results pending

## 2021-07-28 NOTE — Progress Notes (Signed)
Pt found be Not responding to voice when RN tried to communicate with the pt. RN called Camera operator. PT CBG 130. Temp 102.9. HR 112, BP 152/84, RR 26. Given pt tylenol suppository. Notified Dr. [place] Crane. Orders received. See MAR.

## 2021-07-29 ENCOUNTER — Inpatient Hospital Stay (HOSPITAL_COMMUNITY): Payer: Medicare Other

## 2021-07-29 DIAGNOSIS — R1312 Dysphagia, oropharyngeal phase: Secondary | ICD-10-CM

## 2021-07-29 DIAGNOSIS — R6510 Systemic inflammatory response syndrome (SIRS) of non-infectious origin without acute organ dysfunction: Secondary | ICD-10-CM | POA: Diagnosis not present

## 2021-07-29 LAB — CBC
HCT: 33.7 % — ABNORMAL LOW (ref 39.0–52.0)
Hemoglobin: 11.1 g/dL — ABNORMAL LOW (ref 13.0–17.0)
MCH: 30.1 pg (ref 26.0–34.0)
MCHC: 32.9 g/dL (ref 30.0–36.0)
MCV: 91.3 fL (ref 80.0–100.0)
Platelets: 159 10*3/uL (ref 150–400)
RBC: 3.69 MIL/uL — ABNORMAL LOW (ref 4.22–5.81)
RDW: 14.9 % (ref 11.5–15.5)
WBC: 11.4 10*3/uL — ABNORMAL HIGH (ref 4.0–10.5)
nRBC: 0 % (ref 0.0–0.2)

## 2021-07-29 LAB — MAGNESIUM: Magnesium: 1.7 mg/dL (ref 1.7–2.4)

## 2021-07-29 LAB — BASIC METABOLIC PANEL
Anion gap: 8 (ref 5–15)
BUN: 11 mg/dL (ref 8–23)
CO2: 26 mmol/L (ref 22–32)
Calcium: 8.8 mg/dL — ABNORMAL LOW (ref 8.9–10.3)
Chloride: 102 mmol/L (ref 98–111)
Creatinine, Ser: 0.86 mg/dL (ref 0.61–1.24)
GFR, Estimated: >60 mL/min (ref >60)
Glucose, Bld: 100 mg/dL — ABNORMAL HIGH (ref 70–99)
Potassium: 3.3 mmol/L — ABNORMAL LOW (ref 3.5–5.1)
Sodium: 136 mmol/L (ref 135–145)

## 2021-07-29 LAB — PROCALCITONIN: Procalcitonin: 1 ng/mL

## 2021-07-29 MED ORDER — POTASSIUM CHLORIDE CRYS ER 20 MEQ PO TBCR
20.0000 meq | EXTENDED_RELEASE_TABLET | Freq: Three times a day (TID) | ORAL | Status: AC
  Administered 2021-07-29 – 2021-07-30 (×3): 20 meq via ORAL
  Filled 2021-07-29 (×3): qty 1

## 2021-07-29 MED ORDER — BISACODYL 10 MG RE SUPP
10.0000 mg | Freq: Every day | RECTAL | Status: DC | PRN
  Administered 2021-07-29: 10 mg via RECTAL
  Filled 2021-07-29: qty 1

## 2021-07-29 MED ORDER — PNEUMOCOCCAL VAC POLYVALENT 25 MCG/0.5ML IJ INJ
0.5000 mL | INJECTION | INTRAMUSCULAR | Status: DC
  Filled 2021-07-29: qty 0.5

## 2021-07-29 NOTE — Progress Notes (Addendum)
Physical Therapy Treatment Patient Details Name: George Bradley MRN: [id number] DOB: [birth year] Today's Date: 07/29/2021   History of Present Illness Pt is an 80 y.o. male who presented 07/22/21 s/p fall with generalized weakness, AMS, multiple episodes of nonbloody emesis, cough with intermittent yellow/[place] sputum and chest pain with coughing. Admitted for sepsis due to suspected aspiration pneumonia. CT of head revealed no acute intracranial abnormality, small remote left occipital  cortical infarct. 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 He responded well to fluids per progress notes. On this date, pt received in bed, pleasant and agreeable to participation in therapy. SpO2 93% on 2L. Mobilized on RA with desat to 87%. Pt required mod assist supine to sit, min assist sit to stand, and min assist ambulation 10' with RW. Gait distance limited by arrival of breakfast tray, and pt requesting to eat. Pt with c/o L hip pain. RN notified. 2L O2 replaced in recliner. HR maintained in 80s. BP 107/58 in recliner. Pt interactive and alert throughout session. Eating breakfast in recliner at end of session with DIL in room.    Recommendations for follow up therapy are one component of a multi-disciplinary discharge planning process, led by the attending physician.  Recommendations may be updated based on patient status, additional functional criteria and insurance authorization.  Follow Up Recommendations  Acute inpatient rehab (3hours/day)     Assistance Recommended  at Discharge Frequent or constant Supervision/Assistance  Equipment Recommendations  BSC/3in1    Recommendations for Other Services Rehab consult     Precautions / Restrictions Precautions Precautions: Fall;Other (comment) Precaution Comments: monitor vitals     Mobility  Bed Mobility Overal bed mobility: Needs Assistance Bed Mobility: Supine to Sit     Supine to sit: Mod assist;HOB elevated     General bed mobility comments: increased time, cues for sequencing, assist with LLE and to elevate trunk    Transfers Overall transfer level: Needs assistance Equipment used: Rolling walker (2 wheels) Transfers: Sit to/from Stand Sit to Stand: Min assist           General transfer comment: cues for hand placement, assist to power up, increased time to stabilize balance    Ambulation/Gait Ambulation/Gait assistance: Min assist Gait Distance (Feet): 10 Feet Assistive device: Rolling walker (2 wheels) Gait Pattern/deviations: Step-through pattern;Trunk flexed;Decreased weight shift to left;Decreased step length - left Gait velocity: decreased Gait velocity interpretation: <1.31 ft/sec, indicative of household ambulator   General Gait Details: Distance limited to in room due to arrival of breakfast tray.   Stairs             Wheelchair Mobility    Modified Rankin (Stroke Patients Only)       Balance Overall balance assessment: Needs assistance Sitting-balance support: Feet supported;Single extremity supported Sitting balance-Leahy Scale: Fair     Standing balance support: Bilateral upper extremity supported;During functional activity Standing balance-Leahy Scale: Poor Standing balance comment: UE support on RW with mobility.                            Cognition  Arousal/Alertness: Awake/alert Behavior During Therapy: WFL for tasks assessed/performed Overall Cognitive Status: History of cognitive impairments - at baseline                                           Exercises      General Comments General comments (skin integrity, edema, etc.): SpO2 87% on RA. SpO2 93% on 2L. HR in 80s. BP in recliner 107/58.      Pertinent Vitals/Pain Pain Assessment: Faces Faces Pain Scale: Hurts even more Pain Location: L hip Pain Descriptors / Indicators: Grimacing;Guarding;Discomfort Pain Intervention(s): Monitored during session;Repositioned;Limited activity within patient's tolerance    Home Living                          Prior Function            PT Goals (current goals can now be found in the care plan section) Acute Rehab PT Goals Patient Stated Goal: home Progress towards PT goals: Progressing toward goals    Frequency    Min 3X/week      PT Plan Current plan remains appropriate    Co-evaluation              AM-PAC PT "6 Clicks" Mobility   Outcome Measure  Help needed turning from your back to your side while in a flat bed without using bedrails?: A Little Help needed moving from lying on your back to sitting on the side of a flat bed without using bedrails?: A Lot Help needed moving to and from a bed to a chair (including a wheelchair)?: A Little Help needed standing up from a chair using your arms (e.g., wheelchair or bedside chair)?: A Little Help needed to walk in hospital room?: A Lot Help needed climbing 3-5 steps with a railing? : Total 6 Click Score: 14    End of Session Equipment Utilized During Treatment: Gait belt;Oxygen Activity Tolerance: Patient tolerated treatment well Patient left: in chair;with call bell/phone within reach;with chair alarm set;with family/visitor present Nurse Communication: Mobility status PT Visit Diagnosis: Unsteadiness on feet (R26.81);Other abnormalities of gait and mobility (R26.89);Muscle weakness (generalized) (M62.81);Repeated falls (R29.6);History of falling (Z91.81);Difficulty in walking, not elsewhere classified (R26.2)      Time: 6063-0160 PT Time Calculation (min) (ACUTE ONLY): 36 min  Charges:  $Gait Training: 8-22 mins $Therapeutic Activity: 8-22 mins                     Lorrin Goodell, PT  Office # [phone] Pager [phone]    Lorriane Shire 07/29/2021, 8:52 AM

## 2021-07-29 NOTE — Plan of Care (Signed)
  Problem: Activity: Goal: Risk for activity intolerance will decrease Outcome: Progressing   Problem: Nutrition: Goal: Adequate nutrition will be maintained Outcome: Progressing   

## 2021-07-29 NOTE — Consult Note (Addendum)
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 degeneration in the cervical spine. Other neck: Post radiation changes. No evidence of cervical lymphadenopathy or mass. Upper chest: Biapical lung scarring consistent with prior radiation therapy. Review of the MIP images confirms the above findings CTA HEAD FINDINGS Anterior circulation: The intracranial right ICA is occluded proximally with distal reconstitution primarily of the supraclinoid segment and ICA terminus. The intracranial left ICA is patent with mild-to-moderate calcified plaque not resulting in significant  stenosis. ACAs and MCAs are patent without evidence of a proximal branch occlusion or significant proximal stenosis. No aneurysm is identified. Posterior circulation: The intracranial vertebral arteries are patent to the basilar with mild atherosclerotic irregularity bilaterally but no flow limiting stenosis. Patent PICA and SCA origins are identified bilaterally. The basilar artery is widely patent. There is a small right posterior communicating artery. Both PCAs are patent without evidence of a significant proximal stenosis. No aneurysm is identified. Venous sinuses: Patent. Anatomic variants: None. Review of the MIP images confirms the above findings IMPRESSION: 1. 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 2. 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 3. Aortic Atherosclerosis (ICD10-I70.0). Electronically Signed   By: Logan Bores M.D.   On: 07/28/2021 19:57   MR BRAIN WO CONTRAST  Result Date: 07/28/2021 CLINICAL DATA:  Neuro deficit, acute, stroke suspected. History of stroke and right carotid artery occlusion. EXAM: MRI HEAD WITHOUT CONTRAST TECHNIQUE: Multiplanar, multiecho pulse sequences of the brain and surrounding structures were obtained without intravenous contrast. COMPARISON:  Head CT 07/27/2021 and MRI 06/12/2021 FINDINGS: Brain: There is no evidence of an acute infarct, mass, midline shift, or extra-axial fluid collection. Small chronic cortical infarcts are again noted in the right frontal and left occipital lobes with a small amount of hemosiderin deposition associated with the latter. Small T2 hyperintensities in the cerebral white matter bilaterally are unchanged from the prior MRI and are nonspecific but compatible with mild chronic small vessel ischemic disease. There is mild cerebral atrophy. A chronic microhemorrhage is noted in the left centrum semiovale. There is also chronic asymmetric  hemosiderin deposition or mineralization in the right lentiform nucleus. A tiny chronic left cerebellar infarct is unchanged. Vascular: Known occlusion of the right internal carotid artery. Skull and upper cervical spine: Unremarkable bone marrow signal. Sinuses/Orbits: Bilateral cataract extraction. Clear paranasal sinuses. Small bilateral mastoid effusions. Other: None. IMPRESSION: 1. No acute intracranial abnormality. 2. 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 Electronically Signed   By: Logan Bores M.D.   On: 07/28/2021 16:56  EEG adult  Result Date: 07/28/2021 Willaim Rayas, MD     07/28/2021  5:55 PM [facility] Routine EEG Report Patient Name:   George Bradley, George Bradley Date of Birth:   01-28-1941 Identification Number:   MRN - [id number] Date of Study:   07/28/2021 17:29:18 Indication: Encephalopathy, Technical Summary: A routine 20 channel electroencephalogram using the international 10-20 system of electrode placement was performed. Background: 5-6 Hz, Poorly formed States      Awake      Drowsy: were seen during drowsiness Abnormalities Generalized Slowing: Diffuse generalized slowing Background Slowing: The background consists of 20-50 uV, 5-6 Hz diffuse activity with superimposed diffuse polymorphic delta activity that is non reactive to external stimulation. GPDs: Occasional 1 hz generalized sharp wave discharges with triphasic morphology. Occasionally represented better lateralized but favored to represent generalized pathology. Activation Procedures Hyperventilation: Not performed Photic Stimulation: Not performed Classification: Abnormal : Diagnosis: This is abnormal EEG. The presence of mild diffuse slowing and intermixed generalized triphasic waves is consistent with nonspecific generalized neuronal dysfunction and can be seen with metabolic encephalopathy. No seizures are noted. Dr Apolinar [facility]es [facility]  [phone] Case [id number]      IMPRESSION:      Multifactorial dysphagia.  Issues include: recurrent radiation induced esophageal strictures, multiple previous dilatations Esophageal dysmotility.  The only barometer we have for his dysphagia is aspiration pneumonias and findings at previous EGD and swallowing studies.  He does not have true experience of dysphagia so whether or not previous dilatations have been effective is unclear. 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      Second GI opinion of 09/2020, Dr. Loletha Carrow did not feel repeat EGD with dilations would be helpful and patient was to return to the care of Dr. Laural Golden. Not on PPI or H2 blocker now or PTA.    Recurrent aspiration pneumonias.  Currently on Unasyn  History CVA's.  Carotid artery occlusion with thrombectomy, right ICA stenting 11/2020.  Chronic Brilinta, aspirin.  Neither is on hold.    Goals of care, end-of-life wishes.  Palliative care met with the patient and his family on 11/4.  Patient is confirmed DNR with shift to comfort and quality of life, avoid aggressive medical care.  Wishes to proceed with inpatient rehab and initiate hospice services at home.    PLAN:       ? Repeat EGD?  Dr Bryan Lemma will see pt.     Azucena Freed  07/29/2021, 12:03 PM Phone [phone]

## 2021-07-29 NOTE — Progress Notes (Signed)
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  Admitted for sepsis due to aspiration pneumonia.  Seen by speech therapist, has chronic dysphagia.  Patient has declined a dysphagia diet.  Seen by palliative care team, patient would like to go to rehab prior to going home with hospice care.  Hospital course complicated by a syncopal episode the evening of 07/26/2021 after receiving a dose of IV Lopressor 2.5 mg for SBP of 180.  BP was no detectable initially prior to 200 cc normal saline bolus.  Once his BP was stabilized his heart rate increased to 160, and sinus tachycardia.  Due to concern for arrhythmia induced syncope cardiology was consulted on 07/27/2021.  Seen by cardiology, syncope was likely not related to an arrhythmia.  Patient had a recurrent syncopal episode while he was laying supine in his hospital bed, on 07/28/2021.  He appeared postictal afterwards.  Neurology consulted.  No evidence of seizures on EEG.  MRI brain with no evidence of acute stroke, CTA head and neck no significant changes from prior findings, unchanged  occlusion of the right common and internal carotid arteries with supraclinoid reconstitution, unchanged mild to moderate bilateral vertebral artery origin stenosis.    07/29/2021: Patient was seen and examined at his bedside he reports left hip pain.  He is alert and interactive.  Left hip x-ray ordered to further assess.  Analgesics as needed in place.  Assessment/Plan: Principal Problem:   SIRS (systemic inflammatory response syndrome) (HCC) Active Problems:   History of tongue cancer   Orthostatic hypotension   Hypothyroidism   MGUS (monoclonal gammopathy of unknown significance)   Dementia (HCC)   Pressure injury of skin   Syncope  Sepsis secondary to recurrent aspiration pneumonia due to chronic dysphagia, POA Presented with fever T-max 102.1, heart rate 130, WBC 15, chest x-ray personally reviewed showing pulmonary infiltrates in the medial right lung base. Received IV vancomycin and cefepime, MRSA screen negative, DC'd IV vancomycin on 07/24/2021. Completed 5 days of IV cefepime on 07/26/2021. Febrile on 07/28/2021 with T-max 102.9, also tachycardic with heart rate 112, tachypneic with respiration rate 26. Restarted IV antibiotics Unasyn on 07/28/2021, continue. Continue pulmonary toilet Continue DuoNebs every 6 hours  Recurrent syncope, arrhythmia was ruled out, possibly contributed by right carotid artery stenosis. Patient follows with Dr. Martin[place], he has been evaluated with a Holter monitor in the past which did not show significant pauses or bradycardia. Last 2D echo was on 04/28/2021 which showed moderate asymmetric left ventricular hypertrophy of the basal septal segment.  There is abnormal septal motion.  Moderate aortic valve stenosis. Cardiology consulted on 07/27/2021, per cardiology, syncope is less likely secondary to arrhythmia. MRI brain negative for acute CVA, no significant changes from CTA head and neck.   EEG no evidence  of seizures activity. Neurology is  following.  Right carotid artery occlusion, followed by neurology outpatient. On medical management ASA, Brilinta, statin, continue  Chronic dysphagia with recurrent aspiration Currently on dysphagia 3 diet GI has been consulted Continue aspiration precautions.  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 Felecia Shelling) on 05/28/2021 He is also on pilocarpine 7.5 mg 3 times daily. Avoid aggressive treatment of his hypertension. Avoid dehydration Continue to closely monitor on telemetry.  Acute hypoxic respiratory failure secondary to recurrent aspiration pneumonia. Continue to maintain O2 saturation greater than 92%.   He is currently on 4 L nasal cannula. Continue to closely monitor in the setting of likely ongoing aspiration.  Left hip pain, possibly related to the fall that led to his presentation Left hip x-ray ordered, follow results. Continue as needed analgesics Continue fall precautions  Refractory prolonged QTC Twelve-lead EKG done on 07/23/2021 with QTC greater than 550 Continue to optimize magnesium and potassium levels Continue to avoid QTC prolonging agents  CAD s/p CABG Denies any anginal symptoms at the time of this exam Currently on ASA, Brilinta, statin  MGUS Follows with Dr. Alvy Bimler yearly Per medical oncology last note, his myeloma has been stable. Curb sided with medical oncology on 07/29/2021, MGUS is stable.  History of tongue cancer s/p radiation and chemotherapy with chronic dysphagia Seen by speech therapist Continue aspiration precautions GI consulted to further assess his esophageal dysphagia  Hypothyroidism Continue home levothyroxine  History of CVA with right-sided carotid occlusion Continue aspirin, Brilinta and statin.  Polyneuropathy, chemotherapy-induced Continue gabapentin  Chronic anxiety disorder Continue  p.o. Ativan  Memory loss/mild dementia He is on donepezil, continue  Refractory hypokalemia Serum potassium 3.3 Repleted orally  Goals of care Goals of care discussion started with palliative care team on 07/26/2021 Patient would like to go home with hospice care after his rehab admission at [facility].  Critical care time: 65 minutes.     Code Status: DNR  Family Communication: Updated his daughter-in-law at bedside on 07/29/2021.  Disposition Plan: Undetermined at this time, CIR versus home with hospice care.   Consultants: Palliative care team Cardiology Neurology. GI  Procedures: None  Antimicrobials: Cefepime, completed on 07/26/2021. IV vancomycin, DC'd on 07/24/2021. IV Unasyn started on 07/28/2021.  DVT prophylaxis: Subcu Lovenox daily  Status is: Inpatient  Patient will require at least 2 midnights for further evaluation and treatment of present condition.        Objective: Vitals:   07/29/21 0107 07/29/21 0439 07/29/21 0820 07/29/21 1253  BP: (!) 149/86 (!) 161/97 (!) 107/58 96/73  Pulse: 91 82 88 79  Resp: 20 16  17   Temp:  98.1 F (36.7 C)    TempSrc:  Oral  Oral  SpO2: 94% 94% 90% 94%  Weight:      Height:        Intake/Output Summary (Last 24 hours) at 07/29/2021 1442 Last data filed at 07/29/2021 0700 Gross per 24 hour  Intake 1343.45 ml  Output 2500 ml  Net -1156.55 ml   Filed Weights   07/22/21 2306 07/24/21 0345 07/24/21 0449  Weight: 77.1 kg 73.6 kg 73.6 kg    Exam:  General: 80 y.o. year-old male well-developed well-nourished in no acute distress.  He is alert and interactive.   Cardiovascular: Regular rate and rhythm no rubs or gallops.  Respiratory: Diffuse rales bilaterally with poor inspiratory effort. Abdomen: Soft nontender  normal bowel sounds present.  Musculoskeletal: No lower extremity edema bilaterally.   Skin: No ulcerative lesions noted.   Psychiatry: Mood is appropriate for condition and setting.   Neuro: Alert  and awake.  Data Reviewed: CBC: Recent Labs  Lab 07/22/21 1807 07/22/21 1843 07/23/21 0410 07/24/21 0518 07/26/21 2244 07/29/21 0726  WBC 12.5*  --  15.4* 10.7* 11.8* 11.4*  NEUTROABS 10.5*  --   --   --   --   --   HGB 12.8* 12.9* 11.0* 13.0 12.1* 11.1*  HCT 39.2 38.0* 33.9* 39.1 36.2* 33.7*  MCV 94.7  --  93.4 91.6 91.2 91.3  PLT 156  --  136* 153 165 709   Basic Metabolic Panel: Recent Labs  Lab 07/23/21 0410 07/25/21 0226 07/26/21 2244 07/27/21 1133 07/28/21 0113 07/29/21 0726  NA 137 139 134*  --  136 136  K 4.2 3.5 3.1* 4.0 3.3* 3.3*  CL 103 103 100  --  102 102  CO2 27 28 23   --  25 26  GLUCOSE 124* 100* 113*  --  97 100*  BUN 16 21 29*  --  18 11  CREATININE 1.09 1.11 1.25*  --  0.89 0.86  CALCIUM 8.8* 9.2 8.8*  --  8.6* 8.8*  MG  --   --  1.8  --   --  1.7   GFR: Estimated Creatinine Clearance: 71.3 mL/min (by C-G formula based on SCr of 0.86 mg/dL). Liver Function Tests: Recent Labs  Lab 07/22/21 1807 07/23/21 0410  AST 31 22  ALT 27 22  ALKPHOS 37* 28*  BILITOT 0.8 0.6  PROT 7.1 6.3*  ALBUMIN 3.4* 2.8*   No results for input(s): LIPASE, AMYLASE in the last 168 hours. No results for input(s): AMMONIA in the last 168 hours. Coagulation Profile: Recent Labs  Lab 07/22/21 1950  INR 1.1   Cardiac Enzymes: No results for input(s): CKTOTAL, CKMB, CKMBINDEX, TROPONINI in the last 168 hours. BNP (last 3 results) No results for input(s): PROBNP in the last 8760 hours. HbA1C: No results for input(s): HGBA1C in the last 72 hours. CBG: Recent Labs  Lab 07/26/21 2037 07/28/21 1011  GLUCAP 117* 130*   Lipid Profile: No results for input(s): CHOL, HDL, LDLCALC, TRIG, CHOLHDL, LDLDIRECT in the last 72 hours. Thyroid Function Tests: No results for input(s): TSH, T4TOTAL, FREET4, T3FREE, THYROIDAB in the last 72 hours. Anemia Panel: No results for input(s): VITAMINB12, FOLATE, FERRITIN, TIBC, IRON, RETICCTPCT in the last 72 hours. Urine  analysis:    Component Value Date/Time   COLORURINE YELLOW 07/22/2021 1807   APPEARANCEUR CLEAR 07/22/2021 1807   LABSPEC 1.011 07/22/2021 1807   LABSPEC 1.005 06/25/2011 1502   PHURINE 7.0 07/22/2021 1807   GLUCOSEU NEGATIVE 07/22/2021 1807   HGBUR NEGATIVE 07/22/2021 1807   BILIRUBINUR NEGATIVE 07/22/2021 1807   BILIRUBINUR Negative 06/25/2011 1502   KETONESUR 5 (A) 07/22/2021 1807   PROTEINUR NEGATIVE 07/22/2021 1807   UROBILINOGEN 0.2 10/09/2011 1634   NITRITE NEGATIVE 07/22/2021 1807   LEUKOCYTESUR NEGATIVE 07/22/2021 1807   LEUKOCYTESUR Negative 06/25/2011 1502   Sepsis Labs: @LABRCNTIP (procalcitonin:4,lacticidven:4)  ) Recent Results (from the past 240 hour(s))  Resp Panel by RT-PCR (Flu A&B, Covid) Nasopharyngeal Swab     Status: None   Collection Time: 07/22/21  6:16 PM   Specimen: Nasopharyngeal Swab; Nasopharyngeal(NP) swabs in vial transport medium  Result Value Ref Range Status   SARS Coronavirus 2 by RT PCR NEGATIVE NEGATIVE Final    Comment: (NOTE) SARS-CoV-2  target nucleic acids are NOT DETECTED.  The SARS-CoV-2 RNA is generally detectable in upper respiratory specimens during the acute phase of infection. The lowest concentration of SARS-CoV-2 viral copies this assay can detect is 138 copies/mL. A negative result does not preclude SARS-Cov-2 infection and should not be used as the sole basis for treatment or other patient management decisions. A negative result may occur with  improper specimen collection/handling, submission of specimen other than nasopharyngeal swab, presence of viral mutation(s) within the areas targeted by this assay, and inadequate number of viral copies(<138 copies/mL). A negative result must be combined with clinical observations, patient history, and epidemiological information. The expected result is Negative.  Fact Sheet for Patients:  EntrepreneurPulse.com.au  Fact Sheet for Healthcare Providers:   IncredibleEmployment.be  This test is no t yet approved or cleared by the [place] FDA and  has been authorized for detection and/or diagnosis of SARS-CoV-2 by FDA under an Emergency Use Authorization (EUA). This EUA will remain  in effect (meaning this test can be used) for the duration of the COVID-19 declaration under Section 564(b)(1) of the Act, 21 U.S.C.section 360bbb-3(b)(1), unless the authorization is terminated  or revoked sooner.       Influenza A by PCR NEGATIVE NEGATIVE Final   Influenza B by PCR NEGATIVE NEGATIVE Final    Comment: (NOTE) The Xpert Xpress SARS-CoV-2/FLU/RSV plus assay is intended as an aid in the diagnosis of influenza from Nasopharyngeal swab specimens and should not be used as a sole basis for treatment. Nasal washings and aspirates are unacceptable for Xpert Xpress SARS-CoV-2/FLU/RSV testing.  Fact Sheet for Patients: EntrepreneurPulse.com.au  Fact Sheet for Healthcare Providers: IncredibleEmployment.be  This test is not yet approved or cleared by the [place] FDA and has been authorized for detection and/or diagnosis of SARS-CoV-2 by FDA under an Emergency Use Authorization (EUA). This EUA will remain in effect (meaning this test can be used) for the duration of the COVID-19 declaration under Section 564(b)(1) of the Act, 21 U.S.C. section 360bbb-3(b)(1), unless the authorization is terminated or revoked.  Performed at [facility], [place] [address] [place], [place] [address]   Culture, blood (Routine x 2)     Status: None   Collection Time: 07/22/21  6:38 PM   Specimen: BLOOD  Result Value Ref Range Status   Specimen Description BLOOD LEFT ANTECUBITAL  Final   Special Requests   Final    BOTTLES DRAWN AEROBIC AND ANAEROBIC Blood Culture results may not be optimal due to an inadequate volume of blood received in culture bottles   Culture   Final    NO GROWTH 5  DAYS Performed at [facility], [place] [address] [place], [place] [address]    Report Status 07/27/2021 FINAL  Final  Urine Culture     Status: None   Collection Time: 07/22/21  8:38 PM   Specimen: In/Out Cath Urine  Result Value Ref Range Status   Specimen Description IN/OUT CATH URINE  Final   Special Requests NONE  Final   Culture   Final    NO GROWTH Performed at [facility], [place] [address] [place], [place] [address]    Report Status 07/23/2021 FINAL  Final  MRSA Next Gen by PCR, Nasal     Status: None   Collection Time: 07/24/21 10:36 AM   Specimen: Nasal Mucosa; Nasal Swab  Result Value Ref Range Status   MRSA by PCR Next Gen NOT DETECTED NOT DETECTED Final  Comment: (NOTE) The GeneXpert MRSA Assay (FDA approved for NASAL specimens only), is one component of a comprehensive MRSA colonization surveillance program. It is not intended to diagnose MRSA infection nor to guide or monitor treatment for MRSA infections. Test performance is not FDA approved in patients less than 26 years old. Performed at [facility], [place] [address], [place], [place] [address]   Culture, blood (routine x 2)     Status: None (Preliminary result)   Collection Time: 07/28/21 12:04 PM   Specimen: BLOOD LEFT HAND  Result Value Ref Range Status   Specimen Description BLOOD LEFT HAND  Final   Special Requests   Final    BOTTLES DRAWN AEROBIC AND ANAEROBIC Blood Culture adequate volume   Culture   Final    NO GROWTH < 24 HOURS Performed at [facility], [place] [address] [place], [place] [address]    Report Status PENDING  Incomplete  Culture, blood (routine x 2)     Status: None (Preliminary result)   Collection Time: 07/28/21 12:14 PM   Specimen: BLOOD RIGHT HAND  Result Value Ref Range Status   Specimen Description BLOOD RIGHT HAND  Final   Special Requests   Final    BOTTLES DRAWN AEROBIC AND ANAEROBIC Blood Culture adequate volume   Culture   Final    NO GROWTH <  24 HOURS Performed at [facility], [place] [address] [place], [place] [address]    Report Status PENDING  Incomplete      Studies: CT ANGIO HEAD NECK W WO CM  Result Date: 07/28/2021 CLINICAL DATA:  Carotid artery stenosis. EXAM: CT ANGIOGRAPHY HEAD AND NECK TECHNIQUE: Multidetector CT imaging of the head and neck was performed using the standard protocol during bolus administration of intravenous contrast. Multiplanar CT image reconstructions and MIPs were obtained to evaluate the vascular anatomy. Carotid stenosis measurements (when applicable) are obtained utilizing NASCET criteria, using the distal internal carotid diameter as the denominator. CONTRAST:  48mL OMNIPAQUE IOHEXOL 350 MG/ML SOLN COMPARISON:  Head and neck CTA 04/27/2021.  Head MRI 07/28/2021. FINDINGS: CT HEAD FINDINGS Brain: There is no evidence of an acute infarct, intracranial hemorrhage, mass, midline shift, or extra-axial fluid collection. Small chronic infarcts are again noted in the right frontal and left occipital lobes. Hypodensities in the cerebral white matter bilaterally are nonspecific but compatible with mild chronic small vessel ischemic disease. There is mild cerebral atrophy. Vascular: Calcified atherosclerosis at the skull base. Skull: No fracture or suspicious osseous lesion. Sinuses: Clear paranasal sinuses. Small right and trace left mastoid effusions. Orbits: Bilateral cataract extraction. Review of the MIP images confirms the above findings CTA NECK FINDINGS Aortic arch: Standard 3 vessel aortic arch with moderate calcified plaque. No significant arch vessel origin stenosis. Mild stenosis of the right subclavian artery origin due to predominantly calcified plaque. Right carotid system: Unchanged occlusion of the common carotid artery. No reconstitution of the cervical ICA. Reconstitution of some ECA branches. Left carotid system: Patent with a small amount of predominantly calcified plaque scattered throughout  the common carotid artery and at the carotid bifurcation without significant stenosis. Vertebral arteries: Patent with the left being dominant. Calcified plaque at both vertebral origins with mild-to-moderate stenosis bilaterally, similar to the prior CTA. Skeleton: Mild-to-moderate disc and facet degeneration in the cervical spine. Other neck: Post radiation changes. No evidence of cervical lymphadenopathy or mass. Upper chest: Biapical lung scarring consistent with prior radiation therapy. Review of the MIP images confirms the  above findings CTA HEAD FINDINGS Anterior circulation: The intracranial right ICA is occluded proximally with distal reconstitution primarily of the supraclinoid segment and ICA terminus. The intracranial left ICA is patent with mild-to-moderate calcified plaque not resulting in significant stenosis. ACAs and MCAs are patent without evidence of a proximal branch occlusion or significant proximal stenosis. No aneurysm is identified. Posterior circulation: The intracranial vertebral arteries are patent to the basilar with mild atherosclerotic irregularity bilaterally but no flow limiting stenosis. Patent PICA and SCA origins are identified bilaterally. The basilar artery is widely patent. There is a small right posterior communicating artery. Both PCAs are patent without evidence of a significant proximal stenosis. No aneurysm is identified. Venous sinuses: Patent. Anatomic variants: None. Review of the MIP images confirms the above findings IMPRESSION: 1. 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 2. 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 3. Aortic Atherosclerosis (ICD10-I70.0). Electronically Signed   By: Logan Bores M.D.   On: 07/28/2021 19:57   EEG adult  Result Date: 07/28/2021 Willaim Rayas, MD     07/28/2021  5:55 PM [facility] Tele[facility] Consult Services Routine  EEG Report Patient Name:   Jaremy, Nosal Date of Birth:   07-30-1941 Identification Number:   MRN - [id number] Date of Study:   07/28/2021 17:29:18 Indication: Encephalopathy, Technical Summary: A routine 20 channel electroencephalogram using the international 10-20 system of electrode placement was performed. Background: 5-6 Hz, Poorly formed States      Awake      Drowsy: were seen during drowsiness Abnormalities Generalized Slowing: Diffuse generalized slowing Background Slowing: The background consists of 20-50 uV, 5-6 Hz diffuse activity with superimposed diffuse polymorphic delta activity that is non reactive to external stimulation. GPDs: Occasional 1 hz generalized sharp wave discharges with triphasic morphology. Occasionally represented better lateralized but favored to represent generalized pathology. Activation Procedures Hyperventilation: Not performed Photic Stimulation: Not performed Classification: Abnormal : Diagnosis: This is abnormal EEG. The presence of mild diffuse slowing and intermixed generalized triphasic waves is consistent with nonspecific generalized neuronal dysfunction and can be seen with metabolic encephalopathy. No seizures are noted. Dr Apolinar Junes [facility] [phone] Case [id number]    Scheduled Meds:  aspirin EC  81 mg Oral QHS   diclofenac Sodium  4 g Topical QID   donepezil  5 mg Oral QHS   enoxaparin (LOVENOX) injection  40 mg Subcutaneous QHS   feeding supplement  1 Container Oral TID BM   fludrocortisone  0.1 mg Oral Daily   gabapentin  900 mg Oral TID   levothyroxine  100 mcg Oral QAC breakfast   pilocarpine  7.5 mg Oral TID   pyridostigmine  60 mg Oral TID   rosuvastatin  20 mg Oral QHS   senna-docusate  2 tablet Oral Daily   ticagrelor  90 mg Oral BID    Continuous Infusions:  ampicillin-sulbactam (UNASYN) IV 3 g (07/29/21 1203)   lactated ringers 50 mL/hr at 07/28/21 1206     LOS: 6 days     Kayleen Memos, MD [facility] Pager  [phone]  If 7PM-7AM, please contact night-coverage [email or web address] Password [email or web address] 07/29/2021, 2:42 PM

## 2021-07-29 NOTE — Progress Notes (Addendum)
Neurology Progress Note  Subjective: No acute overnight events noted, no further syncopal events documented overnight.  Has had some left hip pain for which she received oxycodone leading to some sleepiness  Exam: Vitals:   07/29/21 0439 07/29/21 0820  BP: (!) 161/97 (!) 107/58  Pulse: 82 88  Resp: 16   Temp: 98.1 F (36.7 C)   SpO2: 94% 90%   Gen: Sitting up at bedside recliner on assessment Resp: non-labored breathing, no respiratory distress, SpO2 94% on telemetry Abd: soft, non-distended, non-tender  Neuro: Mental Status: Awake, alert, and oriented to self, age, place, and situation.  However does fall asleep later during examination while discussing with family He follows simple commands.  Speech is intact without dysarthria.  There are no signs of aphasia or neglect noted.  Cranial Nerves: PERRL, face is symmetric resting and smiling, hearing is intact to voice, shoulders shrug symmetrically, phonation intact, tongue is midline.  Motor: Bilateral upper extremities elevate with antigravity movement without vertical drift.  Lower extremity strength assessment is limited due to patient complaints of hip pain.  Ankle dorsiflexion and plantarflexion 5/5 bilaterally Tone and bulk are normal Sensory:Intact and symmetric to light touch in bilateral upper and lower extremities  Gait: Deferred  Pertinent Labs: CBC    Component Value Date/Time   WBC 11.4 (H) 07/29/2021 0726   RBC 3.69 (L) 07/29/2021 0726   HGB 11.1 (L) 07/29/2021 0726   HGB 13.1 04/11/2019 0740   HGB 13.6 08/21/2017 0952   HCT 33.7 (L) 07/29/2021 0726   HCT 40.7 08/21/2017 0952   PLT 159 07/29/2021 0726   PLT 119 (L) 04/11/2019 0740   PLT 138 (L) 08/21/2017 0952   PLT 163 04/02/2017 1449   MCV 91.3 07/29/2021 0726   MCV 96.2 08/21/2017 0952   MCH 30.1 07/29/2021 0726   MCHC 32.9 07/29/2021 0726   RDW 14.9 07/29/2021 0726   RDW 13.5 08/21/2017 0952   LYMPHSABS 0.9 07/22/2021 1807   LYMPHSABS 1.5  08/21/2017 0952   MONOABS 1.0 07/22/2021 1807   MONOABS 0.7 08/21/2017 0952   EOSABS 0.1 07/22/2021 1807   EOSABS 0.2 08/21/2017 0952   BASOSABS 0.0 07/22/2021 1807   BASOSABS 0.0 08/21/2017 0952   CMP     Component Value Date/Time   NA 136 07/29/2021 0726   NA 141 08/14/2020 1431   NA 140 08/21/2017 0952   K 3.3 (L) 07/29/2021 0726   K 5.1 08/21/2017 0952   CL 102 07/29/2021 0726   CL 106 11/25/2012 0812   CO2 26 07/29/2021 0726   CO2 25 08/21/2017 0952   GLUCOSE 100 (H) 07/29/2021 0726   GLUCOSE 92 08/21/2017 0952   GLUCOSE 102 (H) 11/25/2012 0812   BUN 11 07/29/2021 0726   BUN 19 08/14/2020 1431   BUN 33.9 (H) 08/21/2017 0952   CREATININE 0.86 07/29/2021 0726   CREATININE 1.30 (H) 04/11/2019 0740   CREATININE 1.6 (H) 08/21/2017 0952   CALCIUM 8.8 (L) 07/29/2021 0726   CALCIUM 10.0 08/21/2017 0952   PROT 6.3 (L) 07/23/2021 0410   PROT 7.3 05/20/2018 0820   PROT 8.5 (H) 08/21/2017 0952   ALBUMIN 2.8 (L) 07/23/2021 0410   ALBUMIN 4.6 05/20/2018 0820   ALBUMIN 4.2 08/21/2017 0952   AST 22 07/23/2021 0410   AST 21 04/11/2019 0740   AST 23 08/21/2017 0952   ALT 22 07/23/2021 0410   ALT 16 04/11/2019 0740   ALT 19 08/21/2017 0952   ALKPHOS 28 (L) 07/23/2021 0410  ALKPHOS 40 08/21/2017 0952   BILITOT 0.6 07/23/2021 0410   BILITOT 0.3 04/11/2019 0740   BILITOT 0.26 08/21/2017 0952   GFRNONAA >60 07/29/2021 0726   GFRNONAA 52 (L) 04/11/2019 0740   GFRAA 61 08/14/2020 1431   GFRAA >60 04/11/2019 0740   Imaging Reviewed:  MRI brain wo contrast 11/6: 1. No acute intracranial abnormality. 2. 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  CT angio head neck wwo 11/6: 1. 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 2. 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 3. Aortic Atherosclerosis (ICD10-I70.0).  Routine EEG 11/6: "This is abnormal EEG.  The presence of mild diffuse slowing and intermixed generalized triphasic waves is consistent with nonspecific generalized neuronal dysfunction and can be seen with metabolic encephalopathy. No seizures are noted."  Assessment:  80 y.o. male who was admitted 10/31 for sepsis thought to be 2/2 aspiration pneumonia. Hospitalization complicated by multiple syncopal episodes. The first episode with hypotension and tachycardia while attempting to have a bowel movement concerning for arrhythmia-induced syncope versus vagal response. Second episode of unresponsiveness occurred 11/6 without hypotension and while patient was laying in bed. Neurology was consulted for further evaluation. - Examination is partially limited due to pain with lower extremity movement, however, with improvement in participation today. Patient is able to elevate arms with antigravity movement without vertical drift and is more communicative with examiner despite drowsiness from recent pain medication.  - Presentation was initaitlly concerning for cerebral hypoperfusion with a known right ICA/CCA occlusion and repeated hypotension episodes versus acute infarction versus less likely seizure. EEG was obtained without evidence of seizures and brain imaging is without evidence of acute infarction making these less likely etiologies for patient's presentation.  - It is felt that patient's presentation is most likely due to his longstanding history of orthostatic hypotension versus with impaired cerebral perfusion in the setting of right CCA/ICA occlusion. Patient also has concomitant febrile illness contributing to his vital sign fluctuations that may impair cerebral blood flow.  - Patient's stoke risk factors include advanced age, HLD, CAD, history of strokes, known CCA/ICA occlusion, and severe orthostatic hypotension limiting cerebral perfusion.   Recommendations: - No further inpatient neurology recommendations at this time - Continue to  follow up with outpatient neurology for stroke optimization and further orthostatic hypotension management  - Discussed with family that I would defer management of his orthostatic hypotension medications to his outpatient team as they will be continuing to titrate these, pyridostigmine is used off label for management of orthostatic hypotension and I defer further titration of this versus replacement with midodrine to Dr. Felecia Shelling - We discussed that I do agree with the application of thigh-high compression stockings and abdominal binder prior to getting up to reduce symptoms of orthostatic hypotension as well as drinking water prior to standing up, with removal of stockings and binder when sedentary - Neurology will be available on an as needed basis moving forward for further questions or concerns.   Anibal Henderson, AGACNP-BC [facility] [phone]  Lesleigh Noe MD-PhD [facility] [phone]  Available 7 AM to 7 PM, outside these hours please contact Neurologist on call listed on AMION   Attending Neurologist's note:  I personally saw this patient, gathering history, performing a neurologic examination, reviewing relevant labs, personally reviewing relevant imaging including MRI brain, CTA head and neck, and formulated the assessment and plan, adding the note above for completeness and clarity to accurately reflect my  thoughts  Greater than 35 minutes were spent in the direct care of this patient today, greater than 50% at bedside including reviewing MRI imaging with family and discussing orthostatic hypotension as well as other work-up completed

## 2021-07-29 NOTE — Progress Notes (Signed)
Cardiology Progress Note  Patient ID: George Bradley MRN: [id number] DOB: [birth year] Date of Encounter: 07/29/2021  Primary Cardiologist: None  Subjective   Chief Complaint: None.   HPI: No further syncope per the patient and his wife.  He was drowsy this morning as he received pain medication.  Denies chest pain or trouble breathing.  ROS:  All other ROS reviewed and negative. Pertinent positives noted in the HPI.     Inpatient Medications  Scheduled Meds:  aspirin EC  81 mg Oral QHS   diclofenac Sodium  4 g Topical QID   donepezil  5 mg Oral QHS   enoxaparin (LOVENOX) injection  40 mg Subcutaneous QHS   feeding supplement  1 Container Oral TID BM   fludrocortisone  0.1 mg Oral Daily   gabapentin  900 mg Oral TID   levothyroxine  100 mcg Oral QAC breakfast   pilocarpine  7.5 mg Oral TID   pyridostigmine  60 mg Oral TID   rosuvastatin  20 mg Oral QHS   senna-docusate  2 tablet Oral Daily   ticagrelor  90 mg Oral BID   Continuous Infusions:  ampicillin-sulbactam (UNASYN) IV 3 g (07/29/21 0441)   lactated ringers 50 mL/hr at 07/28/21 1206   PRN Meds: acetaminophen **OR** acetaminophen, LORazepam, oxyCODONE   Vital Signs   Vitals:   07/28/21 1938 07/29/21 0107 07/29/21 0439 07/29/21 0820  BP: 131/75 (!) 149/86 (!) 161/97 (!) 107/58  Pulse: 73 91 82 88  Resp: 15 20 16    Temp: 98.5 F (36.9 C)  98.1 F (36.7 C)   TempSrc: Oral  Oral   SpO2: 93% 94% 94% 90%  Weight:      Height:        Intake/Output Summary (Last 24 hours) at 07/29/2021 0950 Last data filed at 07/29/2021 0700 Gross per 24 hour  Intake 1343.45 ml  Output 2500 ml  Net -1156.55 ml   Last 3 Weights 07/24/2021 07/24/2021 07/22/2021  Weight (lbs) 162 lb 4.1 oz 162 lb 4.1 oz 169 lb 15.6 oz  Weight (kg) 73.6 kg 73.6 kg 77.1 kg      Telemetry  Overnight telemetry shows sinus rhythm 80-90 bpm, PACs noted, which I personally reviewed.   ECG  The most recent ECG shows sinus rhythm, heart rate 90, right  bundle branch block, PVC noted, first AV block, which I personally reviewed.   Physical Exam   Vitals:   07/28/21 1938 07/29/21 0107 07/29/21 0439 07/29/21 0820  BP: 131/75 (!) 149/86 (!) 161/97 (!) 107/58  Pulse: 73 91 82 88  Resp: 15 20 16    Temp: 98.5 F (36.9 C)  98.1 F (36.7 C)   TempSrc: Oral  Oral   SpO2: 93% 94% 94% 90%  Weight:      Height:        Intake/Output Summary (Last 24 hours) at 07/29/2021 0950 Last data filed at 07/29/2021 0700 Gross per 24 hour  Intake 1343.45 ml  Output 2500 ml  Net -1156.55 ml    Last 3 Weights 07/24/2021 07/24/2021 07/22/2021  Weight (lbs) 162 lb 4.1 oz 162 lb 4.1 oz 169 lb 15.6 oz  Weight (kg) 73.6 kg 73.6 kg 77.1 kg    Body mass index is 21.41 kg/m.   General: Ill-appearing Head: Atraumatic, normal size  Eyes: PEERLA, EOMI  Neck: Supple, no JVD Endocrine: No thryomegaly Cardiac: Normal S1, S2; RRR; 2 out of 6 systolic ejection murmur Lungs: Diminished breath sounds bilaterally Abd: Soft, nontender, no hepatomegaly  Ext: No edema, pulses 2+ Musculoskeletal: No deformities, BUE and BLE strength normal and equal Skin: Warm and dry, no rashes   Neuro: Alert and oriented to person, place, time, and situation, CNII-XII grossly intact, no focal deficits  Psych: Normal mood and affect   Labs  High Sensitivity Troponin:   Recent Labs  Lab 07/22/21 1830 07/22/21 2114 07/23/21 0410  TROPONINIHS 19* 40* 68*     Cardiac EnzymesNo results for input(s): TROPONINI in the last 168 hours. No results for input(s): TROPIPOC in the last 168 hours.  Chemistry Recent Labs  Lab 07/22/21 1807 07/22/21 1843 07/23/21 0410 07/25/21 0226 07/26/21 2244 07/27/21 1133 07/28/21 0113 07/29/21 0726  NA 137   < > 137   < > 134*  --  136 136  K 4.4   < > 4.2   < > 3.1* 4.0 3.3* 3.3*  CL 102  --  103   < > 100  --  102 102  CO2 26  --  27   < > 23  --  25 26  GLUCOSE 104*  --  124*   < > 113*  --  97 100*  BUN 20  --  16   < > 29*  --  18 11   CREATININE 1.26*  --  1.09   < > 1.25*  --  0.89 0.86  CALCIUM 9.1  --  8.8*   < > 8.8*  --  8.6* 8.8*  PROT 7.1  --  6.3*  --   --   --   --   --   ALBUMIN 3.4*  --  2.8*  --   --   --   --   --   AST 31  --  22  --   --   --   --   --   ALT 27  --  22  --   --   --   --   --   ALKPHOS 37*  --  28*  --   --   --   --   --   BILITOT 0.8  --  0.6  --   --   --   --   --   GFRNONAA 58*  --  >60   < > 58*  --  >60 >60  ANIONGAP 9  --  7   < > 11  --  9 8   < > = values in this interval not displayed.    Hematology Recent Labs  Lab 07/24/21 0518 07/26/21 2244 07/29/21 0726  WBC 10.7* 11.8* 11.4*  RBC 4.27 3.97* 3.69*  HGB 13.0 12.1* 11.1*  HCT 39.1 36.2* 33.7*  MCV 91.6 91.2 91.3  MCH 30.4 30.5 30.1  MCHC 33.2 33.4 32.9  RDW 15.4 15.1 14.9  PLT 153 165 159   BNP Recent Labs  Lab 07/23/21 0410  BNP 310.6*    DDimer No results for input(s): DDIMER in the last 168 hours.   Radiology  CT ANGIO HEAD NECK W WO CM  Result Date: 07/28/2021 CLINICAL DATA:  Carotid artery stenosis. EXAM: CT ANGIOGRAPHY HEAD AND NECK TECHNIQUE: Multidetector CT imaging of the head and neck was performed using the standard protocol during bolus administration of intravenous contrast. Multiplanar CT image reconstructions and MIPs were obtained to evaluate the vascular anatomy. Carotid stenosis measurements (when applicable) are obtained utilizing NASCET criteria, using the distal internal carotid diameter as the denominator. CONTRAST:  65mL OMNIPAQUE IOHEXOL  350 MG/ML SOLN COMPARISON:  Head and neck CTA 04/27/2021.  Head MRI 07/28/2021. FINDINGS: CT HEAD FINDINGS Brain: There is no evidence of an acute infarct, intracranial hemorrhage, mass, midline shift, or extra-axial fluid collection. Small chronic infarcts are again noted in the right frontal and left occipital lobes. Hypodensities in the cerebral white matter bilaterally are nonspecific but compatible with mild chronic small vessel ischemic disease.  There is mild cerebral atrophy. Vascular: Calcified atherosclerosis at the skull base. Skull: No fracture or suspicious osseous lesion. Sinuses: Clear paranasal sinuses. Small right and trace left mastoid effusions. Orbits: Bilateral cataract extraction. Review of the MIP images confirms the above findings CTA NECK FINDINGS Aortic arch: Standard 3 vessel aortic arch with moderate calcified plaque. No significant arch vessel origin stenosis. Mild stenosis of the right subclavian artery origin due to predominantly calcified plaque. Right carotid system: Unchanged occlusion of the common carotid artery. No reconstitution of the cervical ICA. Reconstitution of some ECA branches. Left carotid system: Patent with a small amount of predominantly calcified plaque scattered throughout the common carotid artery and at the carotid bifurcation without significant stenosis. Vertebral arteries: Patent with the left being dominant. Calcified plaque at both vertebral origins with mild-to-moderate stenosis bilaterally, similar to the prior CTA. Skeleton: Mild-to-moderate disc and facet degeneration in the cervical spine. Other neck: Post radiation changes. No evidence of cervical lymphadenopathy or mass. Upper chest: Biapical lung scarring consistent with prior radiation therapy. Review of the MIP images confirms the above findings CTA HEAD FINDINGS Anterior circulation: The intracranial right ICA is occluded proximally with distal reconstitution primarily of the supraclinoid segment and ICA terminus. The intracranial left ICA is patent with mild-to-moderate calcified plaque not resulting in significant stenosis. ACAs and MCAs are patent without evidence of a proximal branch occlusion or significant proximal stenosis. No aneurysm is identified. Posterior circulation: The intracranial vertebral arteries are patent to the basilar with mild atherosclerotic irregularity bilaterally but no flow limiting stenosis. Patent PICA and SCA  origins are identified bilaterally. The basilar artery is widely patent. There is a small right posterior communicating artery. Both PCAs are patent without evidence of a significant proximal stenosis. No aneurysm is identified. Venous sinuses: Patent. Anatomic variants: None. Review of the MIP images confirms the above findings IMPRESSION: 1. 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 2. 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 3. Aortic Atherosclerosis (ICD10-I70.0). Electronically Signed   By: Logan Bores M.D.   On: 07/28/2021 19:57   MR BRAIN WO CONTRAST  Result Date: 07/28/2021 CLINICAL DATA:  Neuro deficit, acute, stroke suspected. History of stroke and right carotid artery occlusion. EXAM: MRI HEAD WITHOUT CONTRAST TECHNIQUE: Multiplanar, multiecho pulse sequences of the brain and surrounding structures were obtained without intravenous contrast. COMPARISON:  Head CT 07/27/2021 and MRI 06/12/2021 FINDINGS: Brain: There is no evidence of an acute infarct, mass, midline shift, or extra-axial fluid collection. Small chronic cortical infarcts are again noted in the right frontal and left occipital lobes with a small amount of hemosiderin deposition associated with the latter. Small T2 hyperintensities in the cerebral white matter bilaterally are unchanged from the prior MRI and are nonspecific but compatible with mild chronic small vessel ischemic disease. There is mild cerebral atrophy. A chronic microhemorrhage is noted in the left centrum semiovale. There is also chronic asymmetric hemosiderin deposition or mineralization in the right lentiform nucleus. A tiny chronic left cerebellar infarct is unchanged. Vascular: Known occlusion of the right internal carotid artery.  Skull and upper cervical spine: Unremarkable bone marrow signal. Sinuses/Orbits: Bilateral cataract extraction. Clear paranasal sinuses. Small  bilateral mastoid effusions. Other: None. IMPRESSION: 1. No acute intracranial abnormality. 2. 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 Electronically Signed   By: Logan Bores M.D.   On: 07/28/2021 16:56   EEG adult  Result Date: 07/28/2021 Willaim Rayas, MD     07/28/2021  5:55 PM [facility] Routine EEG Report Patient Name:   Kylie, Gros Date of Birth:   [birth year] Identification Number:   MRN - [id number] Date of Study:   07/28/2021 17:29:18 Indication: Encephalopathy, Technical Summary: A routine 20 channel electroencephalogram using the international 10-20 system of electrode placement was performed. Background: 5-6 Hz, Poorly formed States      Awake      Drowsy: were seen during drowsiness Abnormalities Generalized Slowing: Diffuse generalized slowing Background Slowing: The background consists of 20-50 uV, 5-6 Hz diffuse activity with superimposed diffuse polymorphic delta activity that is non reactive to external stimulation. GPDs: Occasional 1 hz generalized sharp wave discharges with triphasic morphology. Occasionally represented better lateralized but favored to represent generalized pathology. Activation Procedures Hyperventilation: Not performed Photic Stimulation: Not performed Classification: Abnormal : Diagnosis: This is abnormal EEG. The presence of mild diffuse slowing and intermixed generalized triphasic waves is consistent with nonspecific generalized neuronal dysfunction and can be seen with metabolic encephalopathy. No seizures are noted. Dr Apolinar [facility] [phone] Case [id number]    Cardiac Studies  TTE 04/28/2021  1. Left ventricular ejection fraction, by estimation, is 50 to 55%. The  left ventricle has low normal function. The left ventricle has no regional  wall motion abnormalities. There is moderate asymmetric left ventricular  hypertrophy of the basal-septal   segment. Left ventricular diastolic parameters are consistent with Grade I  diastolic dysfunction (impaired relaxation). There is abnormal septal  motion.   2. Right ventricular systolic function is mildly reduced. The right  ventricular size is normal.   3. Left atrial size was mildly dilated.   4. The mitral valve is normal in structure. Trivial mitral valve  regurgitation. No evidence of mitral stenosis.   5. The aortic valve is abnormal. There is severe calcifcation of the  aortic valve. Aortic valve regurgitation is mild. Moderate aortic valve  stenosis. Aortic valve mean gradient measures 21.0 mmHg.   Patient Profile  ULISES WOLFINGER is a 80 y.o. male with history of CAD status post CABG, right bundle branch block, moderate aortic stenosis, known orthostatic hypotension, stroke status post mechanical thrombectomy in March 2022, right ICA stent, renal artery stenosis, CKD stage III who was admitted on 07/22/2021 for sepsis and aspiration pneumonia.  Cardiology was consulted on 07/26/2021 for syncope that occurred with defecation.  Assessment & Plan   #Syncope, likely vasovagal  #Orthostatic hypotension -Per review of records he had a syncopal event on 07/26/2021.  This was well using the bedside commode.  He was on telemetry and had sinus tachycardia.  There was no evidence of arrhythmia to explain this. -After discussion with his wife it appears he suffers from orthostatic hypotension.  He also has hypertension when his sitting.  I recommended conservative measures I suspect this is largely vagally mediated.  We discussed maintaining position at 45 degrees and avoiding laying flat.  We have also recommended compression stockings that go up to the abdomen.  I think this would help him.  They will work on this. -His cardiovascular  examination really is unremarkable.  Most recent echocardiogram shows preserved LV function.  He has moderate aortic stenosis which does not explain this.  There is  no evidence of arrhythmia to explain his syncope.  Again this is all likely orthostatic hypotension versus vagally mediated. -He is also been admitted to the hospital with aspiration pneumonia and sepsis.  I suspect this is also driving his issues. -He appears to be on fludrocortisone for his orthostatic hypotension.  We will continue treatment for this.  #CAD status post CABG #Elevated troponin, demand ischemia in the setting of acute illness -No chest pain.  EKG is nonischemic.  Troponins are minimally elevated and flat.  This is not acute coronary syndrome.  This does not explain his syncope.  No further work-up indicated. -We will continue DAPT due to recent R ICA stent that appears to be occluded per notes -continue statin  #Atrial tachycardia -Monitor shows brief A. tach episodes.  I do not believe any of this represents atrial fibrillation.  Beta-blocker being held due to interaction with possible myasthenia gravis medications. -For now no indications to treat with beta-blocker.  We will keep a close eye on this.  This does not explain his syncope.  [facility] will sign off.   Medication Recommendations: Conservative treatment of orthostatic hypotension, abdominal binders and fludrocortisone.  Would recommend adequate p.o. intake.  We also discussed maintaining the bed at 45 degrees and avoiding laying flat Other recommendations (labs, testing, etc): None Follow up as an outpatient: He may keep his regular outpatient follow-up appointments with cardiology  For questions or updates, please contact [place] Please consult [email or web address] for contact info under   Time Spent with Patient: I have spent a total of 35 minutes with patient reviewing hospital notes, telemetry, EKGs, labs and examining the patient as well as establishing an assessment and plan that was discussed with the patient.  > 50% of time was spent in direct patient care.    Signed, Addison Naegeli. Audie Box, MD,  [place]  07/29/2021 9:50 AM

## 2021-07-29 NOTE — Care Management Important Message (Signed)
Important Message  Patient Details  Name: George Bradley MRN: [id number] Date of Birth: 04/10/1941   Medicare Important Message Given:  Yes     Shelda Altes 07/29/2021, 10:28 AM

## 2021-07-30 DIAGNOSIS — F03A0 Unspecified dementia, mild, without behavioral disturbance, psychotic disturbance, mood disturbance, and anxiety: Secondary | ICD-10-CM | POA: Diagnosis not present

## 2021-07-30 DIAGNOSIS — Z515 Encounter for palliative care: Secondary | ICD-10-CM | POA: Diagnosis not present

## 2021-07-30 DIAGNOSIS — K222 Esophageal obstruction: Secondary | ICD-10-CM

## 2021-07-30 DIAGNOSIS — M25552 Pain in left hip: Secondary | ICD-10-CM

## 2021-07-30 DIAGNOSIS — Z7189 Other specified counseling: Secondary | ICD-10-CM | POA: Diagnosis not present

## 2021-07-30 LAB — CREATININE, SERUM
Creatinine, Ser: 0.9 mg/dL (ref 0.61–1.24)
GFR, Estimated: >60 mL/min (ref >60)

## 2021-07-30 MED ORDER — HYDRALAZINE HCL 20 MG/ML IJ SOLN: 10.0000 mg | Freq: Once | INTRAMUSCULAR | Status: DC | PRN

## 2021-07-30 MED ORDER — OXYCODONE HCL 5 MG PO TABS
5.0000 mg | ORAL_TABLET | ORAL | Status: DC | PRN
  Administered 2021-07-30 – 2021-07-31 (×3): 5 mg via ORAL
  Filled 2021-07-30 (×2): qty 1

## 2021-07-30 MED ORDER — ACETAMINOPHEN 500 MG PO TABS
1000.0000 mg | ORAL_TABLET | Freq: Three times a day (TID) | ORAL | Status: DC
  Administered 2021-07-30 – 2021-07-31 (×5): 1000 mg via ORAL
  Filled 2021-07-30 (×5): qty 2

## 2021-07-30 NOTE — Progress Notes (Signed)
Physical Therapy Treatment Patient Details Name: George Bradley MRN: [id number] DOB: 25-Jul-1941 Today's Date: 07/30/2021   History of Present Illness Pt is an 80 y.o. male who presented 07/22/21 s/p fall with generalized weakness, AMS, multiple episodes of nonbloody emesis, cough with intermittent yellow/[place] sputum and chest pain with coughing. Admitted for sepsis due to suspected aspiration pneumonia. CT of head revealed no acute intracranial abnormality, small remote left occipital  cortical infarct. 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 Initially went to see patient and attempted session, but patient was limited by left hip pain. Allowed patient to get some pain medicine then returned later. Patient is agreeable to PT/OT session. Required mod assist with bed mobility, mod assist for sit to stand and min guard for ambulation. Requires +2 assist for lines and safety. Patient did well with ambulation. Slow pace, but no overt lob or difficulty. Limited motivation for ambulation asking why he is doing this. Patient would be appropriate for HHPT, but if goes home with hospice he will not be able to get HHPT.  Patient will continue to benefit from skilled PT while here to improve strength, activity tolerance and independence.         Recommendations for follow up therapy are one component of a multi-disciplinary discharge planning process, led by the attending physician.  Recommendations may be updated based on patient status, additional functional criteria and insurance authorization.  Follow Up Recommendations  Home health PT (wife now  requesting home with hospice.)     Assistance Recommended at Discharge Frequent or constant Supervision/Assistance  Equipment Recommendations       Recommendations for Other Services       Precautions / Restrictions Precautions Precautions: Fall Precaution Comments: monitor vitals Restrictions Weight Bearing Restrictions: No     Mobility  Bed Mobility Overal bed mobility: Needs Assistance Bed Mobility: Supine to Sit     Supine to sit: Mod assist;HOB elevated Sit to supine: Mod assist   General bed mobility comments: increased time, assist to raise trunk to seated position, assist to bring legs on and off bed due to weakness and L hip pain    Transfers Overall transfer level: Needs assistance Equipment used: Rolling walker (2 wheels) Transfers: Sit to/from Stand Sit to Stand: Mod assist           General transfer comment: mod assist to power up from low bed. Increased time. min guard for stand to sit. Cues to reach back.    Ambulation/Gait Ambulation/Gait assistance: Min assist;+2 safety/equipment Gait Distance (Feet): 20 Feet Assistive device: Rolling walker (2 wheels) Gait Pattern/deviations: Step-through pattern;Decreased step length - right;Decreased step length - left;Decreased stride length Gait velocity: decreased     General Gait Details: slow pace, steady when standing. Seems to have decreased motivation.   Stairs             Wheelchair Mobility    Modified Rankin (Stroke Patients Only) Modified Rankin (Stroke Patients Only) Pre-Morbid Rankin Score: Moderate disability Modified Rankin: Moderately severe disability     Balance Overall balance assessment: Needs assistance Sitting-balance support: Feet supported Sitting balance-Leahy Scale: Good Sitting balance - Comments: able to sit independently at eob  Standing balance support: Bilateral upper extremity supported;During functional activity Standing balance-Leahy Scale:  Fair Standing balance comment: B UE support but supervision at times.                            Cognition Arousal/Alertness: Awake/alert Behavior During Therapy: WFL for tasks assessed/performed Overall Cognitive Status: History of cognitive impairments - at baseline                                 General Comments: Requires cues for safe mobility and hand placement with transfers.        Exercises      General Comments        Pertinent Vitals/Pain Pain Assessment: Faces Faces Pain Scale: Hurts little more Pain Location: L hip Pain Descriptors / Indicators: Grimacing;Guarding;Discomfort Pain Intervention(s): Premedicated before session;Limited activity within patient's tolerance;Repositioned;Monitored during session    Home Living                          Prior Function            PT Goals (current goals can now be found in the care plan section) Acute Rehab PT Goals Patient Stated Goal: home PT Goal Formulation: With patient/family Time For Goal Achievement: 08/07/21 Potential to Achieve Goals: Fair Progress towards PT goals: Progressing toward goals    Frequency    Min 2X/week      PT Plan Discharge plan needs to be updated;Frequency needs to be updated    Co-evaluation PT/OT/SLP Co-Evaluation/Treatment: Yes Reason for Co-Treatment: For patient/therapist safety;To address functional/ADL transfers PT goals addressed during session: Mobility/safety with mobility        AM-PAC PT "6 Clicks" Mobility   Outcome Measure  Help needed turning from your back to your side while in a flat bed without using bedrails?: A Lot Help needed moving from lying on your back to sitting on the side of a flat bed without using bedrails?: A Lot Help needed moving to and from a bed to a chair (including a wheelchair)?: A Little Help needed standing up from a chair using your arms (e.g., wheelchair or bedside chair)?: A Lot Help needed  to walk in hospital room?: A Little Help needed climbing 3-5 steps with a railing? : A Lot 6 Click Score: 14    End of Session Equipment Utilized During Treatment: Gait belt;Oxygen Activity Tolerance: Patient tolerated treatment well Patient left: in bed;with call bell/phone within reach;with bed alarm set Nurse Communication: Mobility status PT Visit Diagnosis: Other abnormalities of gait and mobility (R26.89);Muscle weakness (generalized) (M62.81);Repeated falls (R29.6);Difficulty in walking, not elsewhere classified (R26.2);Pain Pain - Right/Left: Left Pain - part of body: Hip     Time: 0277-4128 (+9:55-10:13) PT Time Calculation (min) (ACUTE ONLY): 26 min  Charges:  $Gait Training: 8-22 mins                    [employer], PT, [employer] 07/30/21,12:34 PM

## 2021-07-30 NOTE — Consult Note (Signed)
   [facility] Inpatient Consult   07/30/2021  George Bradley Jul 19, 1941 [id number]  [place] [facility] [ACO] Patient: Medicare CMS DCE  Primary Care Provider: [name], MD, [facility].  Patient is currently active with [facility] for chronic disease management services.  Patient has been engaged by a [facility]. Progress notes reviewed for disposition and TOC needs from PT/OT, palliative consult.  . Patient has been previously active with [facility] Palliative program.   Plan: Follow for transitional needs and update [facility] RN of post hospital disposition.  Of note, [facility] Care Management services does not replace or interfere with any services that are needed or arranged by inpatient [facility] care management team.  For additional questions or referrals please contact:  Natividad Brood, RN BSN [facility] Liaison  [phone] business mobile phone Toll free office [phone]  Fax number: [phone] [email or web address]

## 2021-07-30 NOTE — Progress Notes (Signed)
Inpatient Rehab Admissions Coordinator:   I received a message from [facility] stating that pt. No longer wishes to discharge home with hospice and wants to be considered for CIR. I spoke with Pt. And wife and they agree to pursue CIR. I do not have a bed today, but will pursue for potential admit later this week pending bed availability.  Clemens Catholic, [place], [place] Admissions Coordinator  [phone] ([place]) [phone] (office)

## 2021-07-30 NOTE — Progress Notes (Signed)
[facility] night cross cover note:  I was notified by patient's RN that the patient's systolic blood pressures been gradually increasing overnight, initially in the 140s mmHg, followed by 150s, with most recent blood pressure noted to be 195/95.  Per my brief chart review, including review of most recent [facility] progress note, will rerfrain from being overly aggressive of this hypertensive finding given the patient's history of recurrent syncope.  It also appears that he is on continuous LR at 50 cc/h.  For now, I have discontinued these IVF's, with anticipation of further evaluation of ensuing volume status, and need for resumption of IVF's, by primary day rounding Hospitalist. I've ordered a single prn dose of Hydralazine 10 mg IV to be administered only if repeat BP demonstrates SBP > 180 mmHg or DBP > 120 mmHg.     Babs Bertin, DO Hospitalist

## 2021-07-30 NOTE — Progress Notes (Signed)
[facility] Liaison Note:  Received a request for an educational visit from the [facility]. Met with the patient's wife to explain hospice philosophy, [facility] services and answer questions. Patient family given [facility] contact/services information and patient wife supported during our visit.  Please let us know how [facility] can further support or participate in this patient's care,  Thank you,  Gar Ponto, RN [facility] HLT [phone]

## 2021-07-30 NOTE — Progress Notes (Signed)
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  Admitted for sepsis due to aspiration pneumonia.  Seen by speech therapist, has chronic dysphagia.  Patient has declined a dysphagia diet.  Seen by palliative care team, patient would like to go to rehab prior to going home with hospice care.   Hospital course complicated by a syncopal episode the evening of 07/26/2021 after receiving a dose of IV Lopressor 2.5 mg for SBP of 180.  BP was no detectable initially prior to 200 cc normal saline bolus.  Once his BP was stabilized his heart rate increased to 160, and sinus tachycardia.  Due to concern for arrhythmia induced syncope cardiology was consulted on 07/27/2021.  Seen by cardiology, syncope was likely not related to an arrhythmia.   Patient had a recurrent syncopal episode while he was laying supine in his hospital bed, on 07/28/2021.  He appeared postictal afterwards.  Neurology consulted.  No evidence of seizures on EEG.  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     The patient had been on course to proceed to CIR from acute inpatient setting on 07/29/2021. However, on this date the patient changed his mind and chose instead to go home with hospice. His wife communicated his wishes to case management and nursing.  However, as I visit this patient today, he is open to discussing his decision and states that he wants me to "convince him" to go to rehab. I told him that my intention was to clarify his wishes and to help him realize that and not to convince him. By the end of this day the patient had decided to go forward with CIR.  Consultants  Palliative care Cardiology Neurology GI  Procedures  None  Antibiotics   Anti-infectives (From admission, onward)    Start     Dose/Rate Route Frequency Ordered Stop   07/28/21 1230  Ampicillin-Sulbactam (UNASYN) 3 g in sodium chloride 0.9 % 100 mL IVPB        3 g 200 mL/hr over 30 Minutes Intravenous Every 6 hours 07/28/21 1142     07/23/21 2100  vancomycin (VANCOREADY) IVPB 1250 mg/250 mL  Status:  Discontinued        1,250 mg 166.7 mL/hr over 90 Minutes Intravenous Every 24 hours 07/22/21 2110 07/23/21 0905   07/23/21 2100  vancomycin (VANCOREADY) IVPB 1500 mg/300 mL  Status:  Discontinued        1,500 mg 150 mL/hr over 120 Minutes Intravenous Every 24 hours 07/23/21 0905 07/24/21 1529   07/23/21 0700  ceFEPIme (MAXIPIME) 2 g in sodium chloride 0.9 %  100 mL IVPB        2 g 200 mL/hr over 30 Minutes Intravenous Every 12 hours 07/22/21 2110 07/26/21 1908   07/22/21 1830  ceFEPIme (MAXIPIME) 2 g in sodium chloride 0.9 % 100 mL IVPB        2 g 200 mL/hr over 30 Minutes Intravenous  Once 07/22/21 1816 07/22/21 1929   07/22/21 1830  metroNIDAZOLE (FLAGYL) IVPB 500 mg        500 mg 100 mL/hr over 60 Minutes Intravenous  Once 07/22/21 1816 07/22/21 1958    07/22/21 1830  vancomycin (VANCOREADY) IVPB 1500 mg/300 mL        1,500 mg 150 mL/hr over 120 Minutes Intravenous  Once 07/22/21 1816 07/22/21 2305       Subjective  The patient is awake, alert, and oriented x 3. No acute distress.  Objective   Vitals:  Vitals:   07/30/21 0820 07/30/21 1610  BP: (!) 148/75 116/68  Pulse: 87 76  Resp: 19 12  Temp: 98.5 F (36.9 C) 98 F (36.7 C)  SpO2: 91% 90%    Exam:  Constitutional:  The patient appears weak and frail. He is awake, alert, and oriented x 3. No acute distress. Respiratory:  CTA bilaterally, no w/r/r.  Respiratory effort normal. No retractions or accessory muscle use Cardiovascular:  RRR, no m/r/g No LE extremity edema   Normal pedal pulses Abdomen:  Abdomen appears normal; no tenderness or masses No hernias No HSM Musculoskeletal:  Digits/nails BUE: no clubbing, cyanosis, petechiae, infection Skin:  No rashes, lesions, ulcers palpation of skin: no induration or nodules Neurologic:  CN 2-12 intact Sensation all 4 extremities intact Psychiatric:  Mental status Mood, affect appropriate Orientation to person, place, time  judgment and insight appear intact     I have personally reviewed the following:   Today's Data  Vitals  Lab Data  Creatinine  Micro Data  Blood cultures x 2 No growth  Imaging  CTA head and neck DG left hip with/wo pelvis MRI brain  Scheduled Meds:  acetaminophen  1,000 mg Oral TID   aspirin EC  81 mg Oral QHS   diclofenac Sodium  4 g Topical QID   donepezil  5 mg Oral QHS   enoxaparin (LOVENOX) injection  40 mg Subcutaneous QHS   feeding supplement  1 Container Oral TID BM   fludrocortisone  0.1 mg Oral Daily   gabapentin  900 mg Oral TID   levothyroxine  100 mcg Oral QAC breakfast   pilocarpine  7.5 mg Oral TID   [START ON 07/31/2021] pneumococcal 23 valent vaccine  0.5 mL Intramuscular Tomorrow-1000   pyridostigmine  60 mg Oral TID   rosuvastatin  20 mg Oral QHS    senna-docusate  2 tablet Oral Daily   ticagrelor  90 mg Oral BID   Continuous Infusions:  ampicillin-sulbactam (UNASYN) IV 3 g (07/30/21 1736)    Principal Problem:   SIRS (systemic inflammatory response syndrome) (HCC) Active Problems:   History of tongue cancer   Orthostatic hypotension   Hypothyroidism   MGUS (monoclonal gammopathy of unknown significance)   Esophageal stricture   Dementia (HCC)   Pressure injury of skin   Syncope   LOS: 7 days    A & P  Sepsis secondary to recurrent aspiration pneumonia due to chronic dysphagia, POA Presented with fever T-max 102.1, heart rate 130, WBC 15, chest x-ray personally reviewed showing pulmonary infiltrates in the medial right lung base. Received IV vancomycin and  cefepime, MRSA screen negative, DC'd IV vancomycin on 07/24/2021. Completed 5 days of IV cefepime on 07/26/2021. Febrile on 07/28/2021 with T-max 102.9, also tachycardic with heart rate 112, tachypneic with respiration rate 26. Restarted IV antibiotics Unasyn on 07/28/2021, continue. Continue pulmonary toilet Continue DuoNebs every 6 hours   Recurrent syncope, arrhythmia was ruled out, possibly contributed by right carotid artery stenosis. Patient follows with Dr. [place], he has been evaluated with a Holter monitor in the past which did not show significant pauses or bradycardia. Last 2D echo was on 04/28/2021 which showed moderate asymmetric left ventricular hypertrophy of the basal septal segment.  There is abnormal septal motion.  Moderate aortic valve stenosis. Cardiology consulted on 07/27/2021, per cardiology, syncope is less likely secondary to arrhythmia. MRI brain negative for acute CVA, no significant changes from CTA head and neck.   EEG no evidence of seizures activity. Neurology is following.   Right carotid artery occlusion, followed by neurology outpatient. On medical management ASA, Brilinta, statin, continue   Chronic dysphagia with recurrent  aspiration Currently on dysphagia 3 diet GI has been consulted Continue aspiration precautions.   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 Felecia Shelling) on 05/28/2021 He is also on pilocarpine 7.5 mg 3 times daily. Avoid aggressive treatment of his hypertension. Avoid dehydration Continue to closely monitor on telemetry.   Acute hypoxic respiratory failure secondary to recurrent aspiration pneumonia. Continue to maintain O2 saturation greater than 92%.   He is currently on 4 L nasal cannula. Continue to closely monitor in the setting of likely ongoing aspiration.   Left hip pain, possibly related to the fall that led to his presentation Left hip x-ray ordered, follow results. Continue as needed analgesics Continue fall precautions   Refractory prolonged QTC Twelve-lead EKG done on 07/23/2021 with QTC greater than 550 Continue to optimize magnesium and potassium levels Continue to avoid QTC prolonging agents   CAD s/p CABG Denies any anginal symptoms at the time of this exam Currently on ASA, Brilinta, statin   MGUS Follows with Dr. Alvy Bimler yearly Per medical oncology last note, his myeloma has been stable. Curb sided with medical oncology on 07/29/2021, MGUS is stable.   History of tongue cancer s/p radiation and chemotherapy with chronic dysphagia Seen by speech therapist Continue aspiration precautions GI consulted to further assess his esophageal dysphagia   Hypothyroidism Continue home levothyroxine   History of CVA with right-sided carotid occlusion Continue aspirin, Brilinta and statin.   Polyneuropathy, chemotherapy-induced Continue gabapentin   Chronic anxiety disorder Continue p.o. Ativan   Memory loss/mild dementia He is on donepezil, continue   Refractory hypokalemia Serum potassium 3.3 Repleted orally   Goals of care Goals  of care discussion started with palliative care team on 07/26/2021 Patient would like to go home with hospice care after his rehab admission at [facility].   Critical care time: 65 minutes.       Code Status: DNR   Family Communication: Updated his daughter-in-law at bedside on 07/29/2021.   Disposition Plan: Undetermined at this time, CIR versus home with hospice care.     George Murri, DO [facility] Direct contact: see [email or web address]  7PM-7AM contact night coverage as above 07/30/2021, 6:34 PM  LOS: 7 days

## 2021-07-30 NOTE — Progress Notes (Addendum)
Occupational Therapy Treatment Patient Details Name: George Bradley MRN: [id number] DOB: 1941/04/19 Today's Date: 07/30/2021   History of present illness Pt is an 80 y.o. male who presented 07/22/21 s/p fall with generalized weakness, AMS, multiple episodes of nonbloody emesis, cough with intermittent yellow/[place] sputum and chest pain with coughing. Admitted for sepsis due to suspected aspiration pneumonia. CT of head revealed no acute intracranial abnormality, small remote left occipital  cortical infarct. 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 Pt completed in room mobility from one side of room to the other (window to entry door). Extra time and effort, up to mod A with rw. Pt on 2L O2 with SpO2 in low 90s with occasional dip to upper 80s. BP assessed EOB at start of session and end of session and WNL. Pt's bp running low about an hour ago on first attempt with pt having recently gotten up in chair. D/c plan updated to reflect spouse's wishes.    Recommendations for follow up therapy are one component of a multi-disciplinary discharge planning process, led by the attending physician.  Recommendations may be updated based on patient status, additional functional criteria and insurance authorization.    Follow Up Recommendations  Other (comment) (defer to home hospice)    Assistance Recommended at Discharge Frequent or constant Supervision/Assistance  Equipment Recommendations  None recommended by OT    Recommendations for Other Services      Precautions / Restrictions Precautions Precautions:  Fall Precaution Comments: monitor vitals Restrictions Weight Bearing Restrictions: No       Mobility Bed Mobility Overal bed mobility: Needs Assistance Bed Mobility: Supine to Sit;Sit to Supine     Supine to sit: Mod assist;HOB elevated Sit to supine: Mod assist   General bed mobility comments: increased time, assist to raise trunk to seated position, assist to bring legs on and off bed due to weakness and L hip pain    Transfers Overall transfer level: Needs assistance Equipment used: Rolling walker (2 wheels) Transfers: Sit to/from Stand Sit to Stand: Mod assist           General transfer comment: mod assist to power up from low bed. Increased time. min guard for stand to sit. Cues to reach back.     Balance Overall balance assessment: Needs assistance Sitting-balance support: Feet supported Sitting balance-Leahy Scale: Good Sitting balance - Comments: able to sit independently at eob   Standing balance support: Bilateral upper extremity supported;During functional activity Standing balance-Leahy Scale: Fair Standing balance comment: B UE support but supervision at times.                           ADL either performed or assessed with clinical judgement   ADL Overall ADL's : Needs assistance/impaired                         Toilet Transfer: Minimal assistance;Ambulation   Toileting- Clothing Manipulation and Hygiene: Moderate assistance;Sit to/from stand Toileting - Clothing Manipulation Details (indicate cue type and reason): clinical judgement     Functional mobility during ADLs: Minimal assistance;Rolling walker (2 wheels) General ADL  Comments: Up to mod A with transfers and bathroom distance mobility, +2 chair follow and line management    Extremity/Trunk Assessment Upper Extremity Assessment Upper Extremity Assessment: Generalized weakness;Overall [facility] for tasks assessed   Lower Extremity Assessment Lower Extremity Assessment:  Defer to PT evaluation        Vision       Perception     Praxis      Cognition Arousal/Alertness: Awake/alert Behavior During Therapy: Flat affect Overall Cognitive Status: History of cognitive impairments - at baseline                                 General Comments: Requires cues for safe mobility and hand placement with transfers.          Exercises     Shoulder Instructions       General Comments      Pertinent Vitals/ Pain       Pain Assessment: Faces Faces Pain Scale: Hurts little more Pain Location: L hip Pain Descriptors / Indicators: Grimacing;Guarding;Discomfort Pain Intervention(s): Premedicated before session;Limited activity within patient's tolerance;Monitored during session;Repositioned;Heat applied  Home Living                                          Prior Functioning/Environment              Frequency  Min 2X/week        Progress Toward Goals  OT Goals(current goals can now be found in the care plan section)  Progress towards OT goals: Progressing toward goals  Acute Rehab OT Goals Patient Stated Goal: pt: not stated, agreeable to work with therapy this morning. spouse: home with home hospice OT Goal Formulation: With patient/family Time For Goal Achievement: 08/09/21 Potential to Achieve Goals: Good ADL Goals Pt Will Perform Grooming: with modified independence;standing Pt Will Perform Lower Body Bathing: with min guard assist;sitting/lateral leans;sit to/from stand Pt Will Perform Lower Body Dressing: with min guard assist;sitting/lateral leans;sit to/from stand Pt Will Transfer to Toilet: with supervision;ambulating Pt Will Perform Toileting - Clothing Manipulation and hygiene: with min guard assist;sitting/lateral leans;sit to/from stand  Plan Discharge plan needs to be updated    Co-evaluation    PT/OT/SLP Co-Evaluation/Treatment: Yes Reason for Co-Treatment: For patient/therapist  safety;To address functional/ADL transfers PT goals addressed during session: Mobility/safety with mobility OT goals addressed during session: ADL's and self-care      AM-PAC OT "6 Clicks" Daily Activity     Outcome Measure   Help from another person eating meals?: A Little Help from another person taking care of personal grooming?: A Little Help from another person toileting, which includes using toliet, bedpan, or urinal?: A Lot Help from another person bathing (including washing, rinsing, drying)?: A Lot Help from another person to put on and taking off regular upper body clothing?: A Little Help from another person to put on and taking off regular lower body clothing?: A Little 6 Click Score: 16    End of Session Equipment Utilized During Treatment: Gait belt;Rolling walker (2 wheels);Oxygen (2L)  OT Visit Diagnosis: Unsteadiness on feet (R26.81);Other abnormalities of gait and mobility (R26.89);Muscle weakness (generalized) (M62.81)   Activity Tolerance Patient tolerated treatment well;Patient limited by pain;Patient limited by fatigue   Patient Left in bed;with call bell/phone within reach;with bed alarm set   Nurse Communication Other (  comment) (prior to this session pt noted to have low bp readings in recliner and 6/10 FACEs left hip pain.)        Time: 7125-2712 OT Time Calculation (min): 28 min  Charges: OT General Charges $OT Visit: 1 Visit OT Treatments $Self Care/Home Management : 8-22 mins  Tyrone Schimke, OT Acute Rehabilitation Services Pager: [phone] Office: [phone]   George Bradley 07/30/2021, 12:58 PM

## 2021-07-30 NOTE — Progress Notes (Signed)
Daily Progress Note   Patient Name: George Bradley       Date: 07/30/2021 DOB: May 06, 1941  Age: 80 y.o. MRN#: [id number] Attending Physician: Karie Kirks, DO Primary Care Physician: [name], MD Admit Date: 07/22/2021  Reason for Consultation/Follow-up: Establishing goals of care  Subjective: C/o L hip pain - otherwise feels well. Per wife, ate good amount of breakfast. Unable to work with therapy this AM d/t hip pain. RN bringing PRN pain medicine.  Length of Stay: 7  Current Medications: Scheduled Meds:   acetaminophen  1,000 mg Oral TID   aspirin EC  81 mg Oral QHS   diclofenac Sodium  4 g Topical QID   donepezil  5 mg Oral QHS   enoxaparin (LOVENOX) injection  40 mg Subcutaneous QHS   feeding supplement  1 Container Oral TID BM   fludrocortisone  0.1 mg Oral Daily   gabapentin  900 mg Oral TID   levothyroxine  100 mcg Oral QAC breakfast   pilocarpine  7.5 mg Oral TID   [START ON 07/31/2021] pneumococcal 23 valent vaccine  0.5 mL Intramuscular Tomorrow-1000   pyridostigmine  60 mg Oral TID   rosuvastatin  20 mg Oral QHS   senna-docusate  2 tablet Oral Daily   ticagrelor  90 mg Oral BID    Continuous Infusions:  ampicillin-sulbactam (UNASYN) IV 3 g (07/30/21 0612)    PRN Meds: bisacodyl, hydrALAZINE, LORazepam, oxyCODONE  Physical Exam Constitutional:      General: He is not in acute distress. Pulmonary:     Effort: Pulmonary effort is normal.  Musculoskeletal:     Right lower leg: No edema.     Left lower leg: No edema.  Skin:    General: Skin is warm and dry.  Neurological:     Mental Status: He is alert and oriented to person, place, and time.            Vital Signs: BP (!) 148/75 (BP Location: Left Arm)   Pulse 87   Temp 98.5 F (36.9 C) (Oral)   Resp 19   Ht 6'  1" (1.854 m)   Wt 73.6 kg   SpO2 91%   BMI 21.41 kg/m  SpO2: SpO2: 91 % O2 Device: O2 Device: Nasal Cannula O2 Flow Rate: O2 Flow Rate (L/min): 3 L/min  Intake/output summary:  Intake/Output Summary (Last 24 hours) at 07/30/2021 1015 Last data filed at 07/30/2021 0500 Gross per 24 hour  Intake --  Output 2700 ml  Net -2700 ml   LBM: Last BM Date: 07/29/21 Baseline Weight: Weight: 77.1 kg Most recent weight: Weight: 73.6 kg       Palliative Assessment/Data: PPS 50%    Flowsheet Rows    Flowsheet Row Most Recent Value  Intake Tab   Referral Department Hospitalist  Unit at Time of Referral Cardiac/Telemetry Unit  Palliative Care Primary Diagnosis Sepsis/Infectious Disease  Date Notified 07/25/21  Palliative Care Type New Palliative care  Reason for referral Clarify Goals of Care  Date of Admission 07/22/21  Date first seen by Palliative Care 07/26/21  # of days Palliative referral response time 1 Day(s)  # of days IP prior to Palliative referral 3  Clinical  Assessment   Psychosocial & Spiritual Assessment   Palliative Care Outcomes        Patient Active Problem List   Diagnosis Date Noted   Syncope    Pressure injury of skin 07/25/2021   Hypoxia    SIRS (systemic inflammatory response syndrome) ([place]) 07/22/2021   Lobar pneumonia ([place]) 06/16/2021   HCAP (healthcare-associated pneumonia) 06/15/2021   Hypoalbuminemia due to protein-calorie malnutrition ([place]) 06/15/2021   Prolonged QT interval 06/15/2021   Altered mental status    AKI (acute kidney injury) ([place]) 05/30/2021   Insomnia 05/28/2021   Protein-calorie malnutrition, severe 04/29/2021   Weakness 04/28/2021   Left-sided weakness 04/27/2021   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   Gait disturbance 04/01/2021   Dysesthesia 04/01/2021   Sepsis due to undetermined organism ([place]) 01/24/2021   Aspiration pneumonitis ([place]) 01/23/2021   Pneumonia 01/21/2021   Sepsis ([place]) 12/26/2020   Acute  respiratory failure with hypoxia ([place]) 12/26/2020   Aspiration pneumonia ([place]) 12/26/2020   Dementia ([place]) 12/26/2020   Acute encephalopathy 12/26/2020   Cerebrovascular accident (CVA) ([place]) 12/19/2020   Acute right MCA stroke ([place]) 12/19/2020   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   Spinal stenosis of lumbosacral region 11/13/2020   Numbness 11/13/2020   Weakness of both lower extremities 11/13/2020   Esophageal stricture 07/05/2019   Dizziness 08/18/2017   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   Constipation 03/23/2014   Dysphagia 03/23/2014   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 Laural Golden, GI) admitted on 07/22/2021 with fall at home followed by profound weakness, confusion, emesis, and cough. Admitted for sepsis due to aspiration pneumonia.  Seen by speech therapist, has chronic dysphagia.  Patient has declined a dysphagia diet. PMT consulted to discuss [place].   Assessment: Discussed with patient and wife - no longer interested in CIR stay. Patient wants to go  home. Patient and wife continue to agree to support of hospice at home. Wife interested in speaking to hospice liaisons to determine which hospice to choose. Discussed this with case Freight forwarder.   Discussed symptom management. Patient had BM last night. Good appetite this AM. Only complaint is leg/hip pain - PRN Oxy IR provided relief last night.   Recommendations/Plan: Initiate acetaminophen 1000 mg TID Increase frequency of Oxy IR to q4hr PRN Home with hospice after wife determines which agency - discussed with case manager  Goals of Care and Additional Recommendations: Limitations on Scope of Treatment: Avoid Hospitalization and Full Comfort Care  Code Status: DNR  Prognosis:  Unable to determine but at high risk for repeat aspiration pneumonia and acute decline  Discharge Planning: Home with Hospice  Care plan was discussed with patient, wife, case Freight forwarder, RN  Thank you for allowing the Palliative Medicine Team to assist in the care of this patient.   Total Time 25 minutes Prolonged Time Billed  no       Greater than 50%  of this time was spent counseling and coordinating care related to the above assessment and plan.  Juel Burrow, DNP, [facility] Palliative Medicine Team Team Phone # [phone]  Pager [phone]

## 2021-07-30 NOTE — Progress Notes (Signed)
Inpatient Rehab Admissions Coordinator:   Per pt.'s wife, Pt. Wishes to go home with hospice and no longer wants to pursue CIR. CIR will sign off.   Clemens Catholic, [place], [place] Admissions Coordinator  [phone] ([place]) [phone] (office)

## 2021-07-31 ENCOUNTER — Other Ambulatory Visit: Payer: Self-pay

## 2021-07-31 ENCOUNTER — Encounter (HOSPITAL_COMMUNITY): Payer: Self-pay | Admitting: Physical Medicine and Rehabilitation

## 2021-07-31 ENCOUNTER — Inpatient Hospital Stay (HOSPITAL_COMMUNITY)
Admission: RE | Admit: 2021-07-31 | Discharge: 2021-08-09 | DRG: 945 | Disposition: A | Discharge status: Hospice | Payer: Medicare Other | Source: Intra-hospital | Attending: Physical Medicine & Rehabilitation | Admitting: Physical Medicine & Rehabilitation

## 2021-07-31 DIAGNOSIS — Z87891 Personal history of nicotine dependence: Secondary | ICD-10-CM

## 2021-07-31 DIAGNOSIS — I69391 Dysphagia following cerebral infarction: Secondary | ICD-10-CM

## 2021-07-31 DIAGNOSIS — Z79890 Hormone replacement therapy (postmenopausal): Secondary | ICD-10-CM

## 2021-07-31 DIAGNOSIS — Z8249 Family history of ischemic heart disease and other diseases of the circulatory system: Secondary | ICD-10-CM

## 2021-07-31 DIAGNOSIS — N400 Enlarged prostate without lower urinary tract symptoms: Secondary | ICD-10-CM | POA: Diagnosis present

## 2021-07-31 DIAGNOSIS — F0394 Unspecified dementia, unspecified severity, with anxiety: Secondary | ICD-10-CM | POA: Diagnosis present

## 2021-07-31 DIAGNOSIS — R5381 Other malaise: Principal | ICD-10-CM | POA: Diagnosis present

## 2021-07-31 DIAGNOSIS — Z85818 Personal history of malignant neoplasm of other sites of lip, oral cavity, and pharynx: Secondary | ICD-10-CM

## 2021-07-31 DIAGNOSIS — F419 Anxiety disorder, unspecified: Secondary | ICD-10-CM | POA: Diagnosis present

## 2021-07-31 DIAGNOSIS — N183 Chronic kidney disease, stage 3 unspecified: Secondary | ICD-10-CM | POA: Diagnosis present

## 2021-07-31 DIAGNOSIS — Z803 Family history of malignant neoplasm of breast: Secondary | ICD-10-CM | POA: Diagnosis not present

## 2021-07-31 DIAGNOSIS — A419 Sepsis, unspecified organism: Secondary | ICD-10-CM | POA: Diagnosis present

## 2021-07-31 DIAGNOSIS — G8929 Other chronic pain: Secondary | ICD-10-CM

## 2021-07-31 DIAGNOSIS — E890 Postprocedural hypothyroidism: Secondary | ICD-10-CM | POA: Diagnosis present

## 2021-07-31 DIAGNOSIS — Z974 Presence of external hearing-aid: Secondary | ICD-10-CM

## 2021-07-31 DIAGNOSIS — M7062 Trochanteric bursitis, left hip: Secondary | ICD-10-CM | POA: Diagnosis present

## 2021-07-31 DIAGNOSIS — Y842 Radiological procedure and radiotherapy as the cause of abnormal reaction of the patient, or of later complication, without mention of misadventure at the time of the procedure: Secondary | ICD-10-CM | POA: Diagnosis present

## 2021-07-31 DIAGNOSIS — G620 Drug-induced polyneuropathy: Secondary | ICD-10-CM | POA: Diagnosis present

## 2021-07-31 DIAGNOSIS — G7000 Myasthenia gravis without (acute) exacerbation: Secondary | ICD-10-CM | POA: Diagnosis present

## 2021-07-31 DIAGNOSIS — Z951 Presence of aortocoronary bypass graft: Secondary | ICD-10-CM

## 2021-07-31 DIAGNOSIS — Z79899 Other long term (current) drug therapy: Secondary | ICD-10-CM | POA: Diagnosis not present

## 2021-07-31 DIAGNOSIS — I951 Orthostatic hypotension: Secondary | ICD-10-CM | POA: Diagnosis present

## 2021-07-31 DIAGNOSIS — Z85810 Personal history of malignant neoplasm of tongue: Secondary | ICD-10-CM | POA: Diagnosis not present

## 2021-07-31 DIAGNOSIS — I69354 Hemiplegia and hemiparesis following cerebral infarction affecting left non-dominant side: Secondary | ICD-10-CM | POA: Diagnosis not present

## 2021-07-31 DIAGNOSIS — R1310 Dysphagia, unspecified: Secondary | ICD-10-CM | POA: Diagnosis present

## 2021-07-31 DIAGNOSIS — Z888 Allergy status to other drugs, medicaments and biological substances status: Secondary | ICD-10-CM

## 2021-07-31 DIAGNOSIS — Z7982 Long term (current) use of aspirin: Secondary | ICD-10-CM | POA: Diagnosis not present

## 2021-07-31 DIAGNOSIS — T451X5A Adverse effect of antineoplastic and immunosuppressive drugs, initial encounter: Secondary | ICD-10-CM | POA: Diagnosis present

## 2021-07-31 DIAGNOSIS — Z7952 Long term (current) use of systemic steroids: Secondary | ICD-10-CM

## 2021-07-31 DIAGNOSIS — R296 Repeated falls: Secondary | ICD-10-CM | POA: Diagnosis present

## 2021-07-31 DIAGNOSIS — Z923 Personal history of irradiation: Secondary | ICD-10-CM

## 2021-07-31 DIAGNOSIS — Z87820 Personal history of traumatic brain injury: Secondary | ICD-10-CM

## 2021-07-31 DIAGNOSIS — L89151 Pressure ulcer of sacral region, stage 1: Secondary | ICD-10-CM | POA: Diagnosis present

## 2021-07-31 DIAGNOSIS — E785 Hyperlipidemia, unspecified: Secondary | ICD-10-CM | POA: Diagnosis present

## 2021-07-31 DIAGNOSIS — I6521 Occlusion and stenosis of right carotid artery: Secondary | ICD-10-CM | POA: Diagnosis present

## 2021-07-31 DIAGNOSIS — R6510 Systemic inflammatory response syndrome (SIRS) of non-infectious origin without acute organ dysfunction: Secondary | ICD-10-CM | POA: Diagnosis present

## 2021-07-31 DIAGNOSIS — K219 Gastro-esophageal reflux disease without esophagitis: Secondary | ICD-10-CM | POA: Diagnosis present

## 2021-07-31 DIAGNOSIS — G629 Polyneuropathy, unspecified: Secondary | ICD-10-CM | POA: Diagnosis present

## 2021-07-31 DIAGNOSIS — I440 Atrioventricular block, first degree: Secondary | ICD-10-CM | POA: Diagnosis present

## 2021-07-31 DIAGNOSIS — I2510 Atherosclerotic heart disease of native coronary artery without angina pectoris: Secondary | ICD-10-CM | POA: Diagnosis present

## 2021-07-31 DIAGNOSIS — Z9181 History of falling: Secondary | ICD-10-CM

## 2021-07-31 DIAGNOSIS — F03A4 Unspecified dementia, mild, with anxiety: Secondary | ICD-10-CM | POA: Diagnosis present

## 2021-07-31 DIAGNOSIS — R001 Bradycardia, unspecified: Secondary | ICD-10-CM | POA: Diagnosis present

## 2021-07-31 LAB — CBC
HCT: 36.3 % — ABNORMAL LOW (ref 39.0–52.0)
Hemoglobin: 11.7 g/dL — ABNORMAL LOW (ref 13.0–17.0)
MCH: 29.5 pg (ref 26.0–34.0)
MCHC: 32.2 g/dL (ref 30.0–36.0)
MCV: 91.4 fL (ref 80.0–100.0)
Platelets: 250 10*3/uL (ref 150–400)
RBC: 3.97 MIL/uL — ABNORMAL LOW (ref 4.22–5.81)
RDW: 14.8 % (ref 11.5–15.5)
WBC: 10.9 10*3/uL — ABNORMAL HIGH (ref 4.0–10.5)
nRBC: 0 % (ref 0.0–0.2)

## 2021-07-31 LAB — CREATININE, SERUM
Creatinine, Ser: 1.05 mg/dL (ref 0.61–1.24)
GFR, Estimated: >60 mL/min (ref >60)

## 2021-07-31 MED ORDER — LEVOTHYROXINE SODIUM 100 MCG PO TABS
100.0000 ug | ORAL_TABLET | Freq: Every day | ORAL | Status: DC
  Administered 2021-08-01 – 2021-08-09 (×9): 100 ug via ORAL
  Filled 2021-07-31 (×9): qty 1

## 2021-07-31 MED ORDER — ROSUVASTATIN CALCIUM 20 MG PO TABS: 20.0000 mg | ORAL_TABLET | Freq: Every day | ORAL | Status: DC

## 2021-07-31 MED ORDER — LORAZEPAM 0.5 MG PO TABS
0.5000 mg | ORAL_TABLET | Freq: Three times a day (TID) | ORAL | Status: DC | PRN
  Administered 2021-07-31 – 2021-08-05 (×3): 0.5 mg via ORAL
  Filled 2021-07-31 (×3): qty 1

## 2021-07-31 MED ORDER — DICLOFENAC SODIUM 1 % EX GEL
4.0000 g | Freq: Four times a day (QID) | CUTANEOUS | Status: DC
  Administered 2021-07-31 – 2021-08-06 (×20): 4 g via TOPICAL
  Filled 2021-07-31: qty 100

## 2021-07-31 MED ORDER — BOOST / RESOURCE BREEZE PO LIQD CUSTOM
1.0000 | Freq: Three times a day (TID) | ORAL | Status: DC
  Administered 2021-07-31 – 2021-08-08 (×17): 1 via ORAL

## 2021-07-31 MED ORDER — GABAPENTIN 300 MG PO CAPS
900.0000 mg | ORAL_CAPSULE | Freq: Three times a day (TID) | ORAL | Status: DC
  Administered 2021-07-31 – 2021-08-09 (×26): 900 mg via ORAL
  Filled 2021-07-31 (×27): qty 3

## 2021-07-31 MED ORDER — SENNOSIDES-DOCUSATE SODIUM 8.6-50 MG PO TABS
2.0000 | ORAL_TABLET | Freq: Every day | ORAL | Status: DC
  Administered 2021-08-01 – 2021-08-09 (×9): 2 via ORAL
  Filled 2021-07-31 (×10): qty 2

## 2021-07-31 MED ORDER — SODIUM CHLORIDE 0.9 % IV SOLN: 3.0000 g | Freq: Four times a day (QID) | INTRAVENOUS | 0 refills | Status: DC

## 2021-07-31 MED ORDER — AMOXICILLIN-POT CLAVULANATE 875-125 MG PO TABS: 1.0000 | ORAL_TABLET | Freq: Two times a day (BID) | ORAL | Status: DC

## 2021-07-31 MED ORDER — SENNOSIDES-DOCUSATE SODIUM 8.6-50 MG PO TABS: 2.0000 | ORAL_TABLET | Freq: Every day | ORAL | 0 refills | Status: AC

## 2021-07-31 MED ORDER — ASPIRIN EC 81 MG PO TBEC
81.0000 mg | DELAYED_RELEASE_TABLET | Freq: Every day | ORAL | Status: DC
  Administered 2021-07-31 – 2021-08-08 (×9): 81 mg via ORAL
  Filled 2021-07-31 (×9): qty 1

## 2021-07-31 MED ORDER — SODIUM CHLORIDE 0.9 % IV SOLN
2.0000 g | Freq: Two times a day (BID) | INTRAVENOUS | Status: AC
  Administered 2021-07-31: 2 g via INTRAVENOUS
  Filled 2021-07-31: qty 2

## 2021-07-31 MED ORDER — ENOXAPARIN SODIUM 40 MG/0.4ML IJ SOSY
40.0000 mg | PREFILLED_SYRINGE | Freq: Every day | INTRAMUSCULAR | Status: DC
  Administered 2021-07-31 – 2021-08-08 (×9): 40 mg via SUBCUTANEOUS
  Filled 2021-07-31 (×9): qty 0.4

## 2021-07-31 MED ORDER — GUAIFENESIN ER 600 MG PO TB12
1200.0000 mg | ORAL_TABLET | Freq: Two times a day (BID) | ORAL | Status: AC
  Administered 2021-07-31 – 2021-08-01 (×2): 1200 mg via ORAL
  Filled 2021-07-31 (×2): qty 2

## 2021-07-31 MED ORDER — TICAGRELOR 90 MG PO TABS
90.0000 mg | ORAL_TABLET | Freq: Two times a day (BID) | ORAL | Status: DC
  Administered 2021-07-31 – 2021-08-09 (×18): 90 mg via ORAL
  Filled 2021-07-31 (×18): qty 1

## 2021-07-31 MED ORDER — GABAPENTIN 300 MG PO CAPS: 900.0000 mg | ORAL_CAPSULE | Freq: Three times a day (TID) | ORAL | Status: DC

## 2021-07-31 MED ORDER — AMOXICILLIN-POT CLAVULANATE 875-125 MG PO TABS: 1.0000 | ORAL_TABLET | Freq: Two times a day (BID) | ORAL | 0 refills | Status: AC

## 2021-07-31 MED ORDER — LORAZEPAM 0.5 MG PO TABS
0.5000 mg | ORAL_TABLET | Freq: Three times a day (TID) | ORAL | 0 refills | Status: DC | PRN
Start: 2021-07-31 — End: 2021-08-09

## 2021-07-31 MED ORDER — BISACODYL 10 MG RE SUPP
10.0000 mg | Freq: Every day | RECTAL | Status: DC | PRN
  Administered 2021-08-02: 10 mg via RECTAL
  Filled 2021-07-31: qty 1

## 2021-07-31 MED ORDER — DONEPEZIL HCL 5 MG PO TABS
5.0000 mg | ORAL_TABLET | Freq: Every day | ORAL | Status: DC
  Administered 2021-07-31 – 2021-08-08 (×9): 5 mg via ORAL
  Filled 2021-07-31 (×9): qty 1

## 2021-07-31 MED ORDER — SODIUM CHLORIDE 0.9 % IV BOLUS
500.0000 mL | Freq: Once | INTRAVENOUS | Status: AC
  Administered 2021-07-31: 500 mL via INTRAVENOUS

## 2021-07-31 MED ORDER — PILOCARPINE HCL 5 MG PO TABS
7.5000 mg | ORAL_TABLET | Freq: Three times a day (TID) | ORAL | Status: DC
  Administered 2021-07-31 – 2021-08-09 (×26): 7.5 mg via ORAL
  Filled 2021-07-31 (×27): qty 2

## 2021-07-31 MED ORDER — AMOXICILLIN-POT CLAVULANATE 875-125 MG PO TABS: 1.0000 | ORAL_TABLET | Freq: Two times a day (BID) | ORAL | 0 refills | Status: DC

## 2021-07-31 MED ORDER — OXYCODONE HCL 5 MG PO TABS: 5.0000 mg | ORAL_TABLET | ORAL | 0 refills | Status: DC | PRN

## 2021-07-31 MED ORDER — PYRIDOSTIGMINE BROMIDE 60 MG PO TABS
60.0000 mg | ORAL_TABLET | Freq: Three times a day (TID) | ORAL | Status: DC
  Administered 2021-07-31 – 2021-08-09 (×26): 60 mg via ORAL
  Filled 2021-07-31 (×27): qty 1

## 2021-07-31 MED ORDER — ENOXAPARIN SODIUM 40 MG/0.4ML IJ SOSY: 40.0000 mg | PREFILLED_SYRINGE | INTRAMUSCULAR | Status: DC

## 2021-07-31 MED ORDER — FLUDROCORTISONE ACETATE 0.1 MG PO TABS
0.1000 mg | ORAL_TABLET | Freq: Every day | ORAL | Status: DC
  Administered 2021-08-01 – 2021-08-06 (×6): 0.1 mg via ORAL
  Filled 2021-07-31 (×7): qty 1

## 2021-07-31 MED ORDER — OXYCODONE HCL 5 MG PO TABS
5.0000 mg | ORAL_TABLET | ORAL | Status: DC | PRN
  Administered 2021-07-31 – 2021-08-08 (×8): 5 mg via ORAL
  Filled 2021-07-31 (×8): qty 1

## 2021-07-31 MED ORDER — GABAPENTIN 400 MG PO CAPS: 1000.0000 mg | ORAL_CAPSULE | Freq: Three times a day (TID) | ORAL | Status: DC

## 2021-07-31 NOTE — Progress Notes (Signed)
Inpatient Rehabilitation Admissions Coordinator   I met with patient , wife and daughter in law at bedside. All wish to pursue Cir admit prior to return home, not Hospice home health. Patient receiving IVF bolus this am and Dr Benny Lennert to order thigh high TEDS for has had chronic issues with Bps at times. Discussed with Dr Ranell Patrick, Dr Benny Lennert and RN . I will make arrangements to admit to CIR today. Acute team and TOC made aware.  Danne Baxter, RN, MSN Rehab Admissions Coordinator [phone] 07/31/2021 11:06 AM
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
## 2021-07-31 NOTE — Discharge Summary (Addendum)
Physician Discharge Summary  George Bradley BXU:[id number] DOB: 11-13-1940 DOA: 07/22/2021  PCP: [name], MD  Admit date: 07/22/2021 Discharge date: 07/31/2021  Recommendations for Outpatient Follow-up:  Discharge to CIR for ongoing rehab. Wear compression stockings anytime he is sitting up or standing. Follow up with PCP in 7-10 days after discharge from rehab.  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  Patient states wife states that patient went to work in the yard and on the way back he had a fall when patient's wife went to check on him he appeared confused had difficulty talking and was brought to the ER.   ED Course: In the ER patient appeared initially very confused with temperature 102 F tachycardic.  Source was not clear.  COVID test and flu test were negative chest x-ray showed congestion.  Blood cultures obtained started on empiric antibiotics.  Patient was given fluid recitation as sepsis.  Labs are significant for WBC: 12.5 lactic acid 1.8 high sensitive troponin of 19 and 40.  Patient also required oxygen to maintain sats and at the time of my exam was  nonrebreather.  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  Admitted for sepsis due to aspiration pneumonia.  Seen by speech therapist, has chronic dysphagia.  Patient has declined a dysphagia diet.  Seen by palliative care team, patient would like to go to rehab prior to going home with hospice care.   Hospital course complicated by a syncopal episode the evening of 07/26/2021 after receiving a dose of IV Lopressor 2.5 mg for SBP of 180.  BP was no detectable initially prior to 200 cc normal saline bolus.  Once his BP was stabilized his heart rate increased to 160, and sinus tachycardia.  Due to concern for arrhythmia induced syncope cardiology was consulted on 07/27/2021.  Seen by cardiology, syncope was likely not related to an arrhythmia.   Patient had a recurrent syncopal episode while he was laying supine in his hospital bed, on 07/28/2021.  He appeared postictal afterwards.  Neurology consulted.  No evidence of seizures on EEG.  MRI brain with no evidence of  acute stroke, CTA head and neck no significant changes from prior findings, unchanged occlusion of the right common and internal carotid arteries with supraclinoid reconstitution,  unchanged mild to moderate bilateral vertebral artery origin stenosis.     The patient had been on course to proceed to CIR from acute inpatient setting on 07/29/2021. However, on this date the patient changed his mind and chose instead to go home with hospice. His wife communicated his wishes to case management and nursing.   However, as I visit this patient today, he is open to discussing his decision and states that he wants me to "convince him" to go to rehab. I told him that my intention was to clarify his wishes and to help him realize that and not to convince him. By the end of this day the patient had decided to go forward with CIR.  The patient had some orthostatic hypotension this morning. He was given a 500 cc bolus and thigh high compression stockings were started.  He will be discharged to CIR today.  Today's assessment: S: The patient is resting comfortably. O: Vitals:  Vitals:   07/31/21 0728 07/31/21 1155  BP: (!) 151/89 139/80  Pulse: 83 74  Resp: 13   Temp: 98 F (36.7 C)   SpO2: 97% 98%    Constitutional:  The patient is resting comfortably. No new complaints.  Respiratory:  CTA bilaterally, no w/r/r.  Respiratory effort normal. No retractions or accessory muscle use Cardiovascular:  RRR, no m/r/g No LE extremity edema   Normal pedal pulses Abdomen:  Abdomen appears normal; no tenderness or masses No hernias No HSM Musculoskeletal:  Digits/nails: no clubbing, cyanosis, petechiae, infection Skin:  No rashes, lesions, ulcers palpation of skin: no induration or nodules Neurologic:  CN 2-12 intact Sensation all 4 extremities intact Psychiatric:  judgement and insight appear normal Mental status Mood, affect appropriate Orientation to person, place, time   Discharge Instructions  Discharge Instructions     Activity as tolerated - No restrictions   Complete by: As directed    Call MD for:  persistant nausea and vomiting   Complete by: As directed     Call MD for:  severe uncontrolled pain   Complete by: As directed    Diet - low sodium heart healthy   Complete by: As directed    Discharge instructions   Complete by: As directed    Discharge to CIR for ongoing rehab.   Discharge wound care:   Complete by: As directed    Mepilex to sacrum daily. Off-load sacrum   Increase activity slowly   Complete by: As directed       Allergies as of 07/31/2021       Reactions   Doxepin Other (See Comments)   Left-sided weakness appeared the morning after this was first taken   Zetia [ezetimibe] Other (See Comments)   "made me feel bad"        Medication List     STOP taking these medications    amLODipine 5 MG tablet Commonly known as: NORVASC   indomethacin 25 MG capsule Commonly known as: INDOCIN   Repatha SureClick 387 MG/ML Soaj Generic drug: Evolocumab   traMADol 50 MG tablet Commonly known as: ULTRAM       TAKE these medications    amitriptyline 25 MG tablet Commonly known as: ELAVIL Take 25 mg by mouth at bedtime.   amoxicillin-clavulanate 875-125 MG tablet Commonly known as: Augmentin Take 1 tablet by mouth 2 (two)  times daily for 2 days. Start taking on: August 02, 2021 What changed:  when to take this These instructions start on August 02, 2021. If you are unsure what to do until then, ask your doctor or other care provider.   Ampicillin-Sulbactam 3 g in sodium chloride 0.9 % 100 mL Inject 3 g into the vein every 6 (six) hours.   aspirin 81 MG EC tablet Take 1 tablet (81 mg total) by mouth daily. Swallow whole. What changed: when to take this   Brilinta 90 MG Tabs tablet Generic drug: ticagrelor Take 90 mg by mouth in the morning and at bedtime.   cetirizine 10 MG tablet Commonly known as: ZYRTEC Take 10 mg by mouth daily.   donepezil 5 MG tablet Commonly known as: ARICEPT Take 5 mg by mouth at bedtime.   feeding supplement Liqd Take 237 mLs by mouth 2 (two) times daily between  meals.   fludrocortisone 0.1 MG tablet Commonly known as: FLORINEF Take 0.1 mg by mouth in the morning.   gabapentin 300 MG capsule Commonly known as: NEURONTIN TAKE 3 CAPSULES(900 MG) BY MOUTH THREE TIMES DAILY What changed: See the new instructions.   levothyroxine 100 MCG tablet Commonly known as: SYNTHROID TAKE ONE TABLET BY MOUTH ONCE DAILY BEFORE  BREAKFAST What changed: See the new instructions.   LORazepam 0.5 MG tablet Commonly known as: ATIVAN Take 1 tablet (0.5 mg total) by mouth every 8 (eight) hours as needed for anxiety. What changed:  medication strength how much to take   oxyCODONE 5 MG immediate release tablet Commonly known as: Oxy IR/ROXICODONE Take 1 tablet (5 mg total) by mouth every 4 (four) hours as needed for severe pain.   pilocarpine 7.5 MG tablet Commonly known as: SALAGEN Take 7.5 mg by mouth 3 (three) times daily.   pyridostigmine 60 MG tablet Commonly known as: Mestinon Take 0.5 tablets (30 mg total) by mouth 3 (three) times daily. What changed: how much to take   rosuvastatin 20 MG tablet Commonly known as: CRESTOR Take 1 tablet (20 mg total) by mouth daily. What changed: when to take this   senna-docusate 8.6-50 MG tablet Commonly known as: Senokot-S Take 2 tablets by mouth daily. Start taking on: August 01, 2021               Discharge Care Instructions  (From admission, onward)           Start     Ordered   07/31/21 0000  Discharge wound care:       Comments: Mepilex to sacrum daily. Off-load sacrum   07/31/21 1705           Allergies  Allergen Reactions   Doxepin Other (See Comments)    Left-sided weakness appeared the morning after this was first taken   Zetia [Ezetimibe] Other (See Comments)    "made me feel bad"    The results of significant diagnostics from this hospitalization (including imaging, microbiology, ancillary and laboratory) are listed below for reference.    Significant Diagnostic  Studies: CT ANGIO HEAD NECK W WO CM  Result Date: 07/28/2021 CLINICAL DATA:  Carotid artery stenosis. EXAM: CT ANGIOGRAPHY HEAD AND NECK TECHNIQUE: Multidetector CT imaging of the head and neck was performed using the standard protocol during bolus administration of intravenous contrast. Multiplanar CT image reconstructions and MIPs were obtained to evaluate the vascular anatomy. Carotid stenosis measurements (when applicable) are obtained utilizing NASCET criteria, using the distal internal carotid diameter as the denominator.  CONTRAST:  59mL OMNIPAQUE IOHEXOL 350 MG/ML SOLN COMPARISON:  Head and neck CTA 04/27/2021.  Head MRI 07/28/2021. FINDINGS: CT HEAD FINDINGS Brain: There is no evidence of an acute infarct, intracranial hemorrhage, mass, midline shift, or extra-axial fluid collection. Small chronic infarcts are again noted in the right frontal and left occipital lobes. Hypodensities in the cerebral white matter bilaterally are nonspecific but compatible with mild chronic small vessel ischemic disease. There is mild cerebral atrophy. Vascular: Calcified atherosclerosis at the skull base. Skull: No fracture or suspicious osseous lesion. Sinuses: Clear paranasal sinuses. Small right and trace left mastoid effusions. Orbits: Bilateral cataract extraction. Review of the MIP images confirms the above findings CTA NECK FINDINGS Aortic arch: Standard 3 vessel aortic arch with moderate calcified plaque. No significant arch vessel origin stenosis. Mild stenosis of the right subclavian artery origin due to predominantly calcified plaque. Right carotid system: Unchanged occlusion of the common carotid artery. No reconstitution of the cervical ICA. Reconstitution of some ECA branches. Left carotid system: Patent with a small amount of predominantly calcified plaque scattered throughout the common carotid artery and at the carotid bifurcation without significant stenosis. Vertebral arteries: Patent with the left being  dominant. Calcified plaque at both vertebral origins with mild-to-moderate stenosis bilaterally, similar to the prior CTA. Skeleton: Mild-to-moderate disc and facet degeneration in the cervical spine. Other neck: Post radiation changes. No evidence of cervical lymphadenopathy or mass. Upper chest: Biapical lung scarring consistent with prior radiation therapy. Review of the MIP images confirms the above findings CTA HEAD FINDINGS Anterior circulation: The intracranial right ICA is occluded proximally with distal reconstitution primarily of the supraclinoid segment and ICA terminus. The intracranial left ICA is patent with mild-to-moderate calcified plaque not resulting in significant stenosis. ACAs and MCAs are patent without evidence of a proximal branch occlusion or significant proximal stenosis. No aneurysm is identified. Posterior circulation: The intracranial vertebral arteries are patent to the basilar with mild atherosclerotic irregularity bilaterally but no flow limiting stenosis. Patent PICA and SCA origins are identified bilaterally. The basilar artery is widely patent. There is a small right posterior communicating artery. Both PCAs are patent without evidence of a significant proximal stenosis. No aneurysm is identified. Venous sinuses: Patent. Anatomic variants: None. Review of the MIP images confirms the above findings IMPRESSION: 1. 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 2. 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 3. Aortic Atherosclerosis (ICD10-I70.0). Electronically Signed   By: Logan Bores M.D.   On: 07/28/2021 19:57   CT HEAD WO CONTRAST (5MM)  Result Date: 07/27/2021 CLINICAL DATA:  Left lower extremity weakness EXAM: CT HEAD WITHOUT CONTRAST TECHNIQUE: Contiguous axial images were obtained from the base of the skull through the vertex without intravenous contrast. COMPARISON:  None. FINDINGS:  Brain: There is no mass, hemorrhage or extra-axial collection. The size and configuration of the ventricles and extra-axial CSF spaces are normal. The brain parenchyma is normal, without acute or chronic infarction. Vascular: No abnormal hyperdensity of the major intracranial arteries or dural venous sinuses. No intracranial atherosclerosis. Skull: The visualized skull base, calvarium and extracranial soft tissues are normal. Sinuses/Orbits: No fluid levels or advanced mucosal thickening of the visualized paranasal sinuses. No mastoid or middle ear effusion. The orbits are normal. IMPRESSION: Normal head CT. Electronically Signed   By: Ulyses Jarred M.D.   On: 07/27/2021 03:19   CT Head Wo Contrast  Result Date: 07/22/2021 CLINICAL DATA:  Altered mental status EXAM: CT HEAD WITHOUT  CONTRAST TECHNIQUE: Contiguous axial images were obtained from the base of the skull through the vertex without intravenous contrast. COMPARISON:  MRI 06/12/2021 FINDINGS: Brain: Normal anatomic configuration of the brain. Parenchymal volume is relatively well preserved given the patient's age. Focal encephalomalacia in keeping with remote cortical infarct noted within the left occipital cortex. No evidence of acute intracranial hemorrhage or infarct. No abnormal mass effect or midline shift. No abnormal intra or extra-axial mass lesion or fluid collection. Ventricular size is normal. Cerebellum is unremarkable. Vascular: No hyperdense vessel or unexpected calcification. Skull: Normal. Negative for fracture or focal lesion. Sinuses/Orbits: Ocular lenses have been removed the orbits are otherwise unremarkable. Paranasal sinuses are clear save for a small mucous retention cyst within the right frontal sinus. Other: Mastoid air cells and middle ear cavities are clear. IMPRESSION: No acute intracranial abnormality. Small remote left occipital cortical infarct. Electronically Signed   By: Fidela Salisbury M.D.   On: 07/22/2021 20:44   MR  BRAIN WO CONTRAST  Result Date: 07/28/2021 CLINICAL DATA:  Neuro deficit, acute, stroke suspected. History of stroke and right carotid artery occlusion. EXAM: MRI HEAD WITHOUT CONTRAST TECHNIQUE: Multiplanar, multiecho pulse sequences of the brain and surrounding structures were obtained without intravenous contrast. COMPARISON:  Head CT 07/27/2021 and MRI 06/12/2021 FINDINGS: Brain: There is no evidence of an acute infarct, mass, midline shift, or extra-axial fluid collection. Small chronic cortical infarcts are again noted in the right frontal and left occipital lobes with a small amount of hemosiderin deposition associated with the latter. Small T2 hyperintensities in the cerebral white matter bilaterally are unchanged from the prior MRI and are nonspecific but compatible with mild chronic small vessel ischemic disease. There is mild cerebral atrophy. A chronic microhemorrhage is noted in the left centrum semiovale. There is also chronic asymmetric hemosiderin deposition or mineralization in the right lentiform nucleus. A tiny chronic left cerebellar infarct is unchanged. Vascular: Known occlusion of the right internal carotid artery. Skull and upper cervical spine: Unremarkable bone marrow signal. Sinuses/Orbits: Bilateral cataract extraction. Clear paranasal sinuses. Small bilateral mastoid effusions. Other: None. IMPRESSION: 1. No acute intracranial abnormality. 2. 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 Electronically Signed   By: Logan Bores M.D.   On: 07/28/2021 16:56   DG Pelvis Portable  Result Date: 07/22/2021 CLINICAL DATA:  Fall. EXAM: PORTABLE PELVIS 1-2 VIEWS COMPARISON:  CT chest abdomen and pelvis 05/30/2021. FINDINGS: There is no evidence of pelvic fracture or diastasis. No pelvic bone lesions are seen. There are vascular calcifications in the soft tissues. IMPRESSION: Negative. Electronically Signed   By: Ronney Asters M.D.   On: 07/22/2021 19:48    DG CHEST PORT 1 VIEW  Result Date: 07/26/2021 CLINICAL DATA:  Syncopal episode. EXAM: PORTABLE CHEST 1 VIEW COMPARISON:  Portable chest 07/23/2021 FINDINGS: The cardiac size is normal. There are median sternotomy sutures again noted. The aorta is tortuous and calcified. no vascular congestion is seen. The lungs show mild chronic changes without focal pneumonia with interval clearance of prior right infrahilar opacity which was probably atelectasis. Osteopenia and thoracic spondylosis. IMPRESSION: No evidence of acute chest disease.  Chronic change. Electronically Signed   By: Telford Nab M.D.   On: 07/26/2021 22:41   DG CHEST PORT 1 VIEW  Result Date: 07/23/2021 CLINICAL DATA:  80 year old male with shortness of breath, weakness, left side deficit, declining mental status. EXAM: PORTABLE CHEST 1 VIEW COMPARISON:  Portable chest 07/22/2021 and earlier. FINDINGS: Portable AP semi  upright view at 0411 hours. Mildly lower lung volumes since September. Increased patchy opacity at the medial right lung base. But left lung base ventilation has improved since yesterday. No superimposed pneumothorax, pulmonary edema or pleural effusion. Stable cardiac size and mediastinal contours. Prior CABG. Visualized tracheal air column is within normal limits. Paucity of bowel gas in the upper abdomen. No acute osseous abnormality identified. IMPRESSION: 1. Increasing opacity at the medial right lung base. Cannot exclude aspiration and/or pneumonia. 2. No other acute cardiopulmonary abnormality. Electronically Signed   By: Genevie Ann M.D.   On: 07/23/2021 04:26   DG Chest Port 1 View  Result Date: 07/22/2021 CLINICAL DATA:  Sepsis EXAM: PORTABLE CHEST 1 VIEW COMPARISON:  06/15/2021 FINDINGS: Unchanged median sternotomy. Mild cardiomegaly with calcific aortic atherosclerosis. Mild pulmonary vascular congestion without overt pulmonary edema. No focal airspace consolidation. Normal pleural spaces. IMPRESSION: Cardiomegaly  and mild pulmonary vascular congestion without overt pulmonary edema. Electronically Signed   By: Ulyses Jarred M.D.   On: 07/22/2021 19:45   EEG adult  Result Date: 07/28/2021 Willaim Rayas, MD     07/28/2021  5:55 PM [facility] Routine EEG Report Patient Name:   Corvin, Sorbo Date of Birth:   [birth year] Identification Number:   MRN - [id number] Date of Study:   07/28/2021 17:29:18 Indication: Encephalopathy, Technical Summary: A routine 20 channel electroencephalogram using the international 10-20 system of electrode placement was performed. Background: 5-6 Hz, Poorly formed States      Awake      Drowsy: were seen during drowsiness Abnormalities Generalized Slowing: Diffuse generalized slowing Background Slowing: The background consists of 20-50 uV, 5-6 Hz diffuse activity with superimposed diffuse polymorphic delta activity that is non reactive to external stimulation. GPDs: Occasional 1 hz generalized sharp wave discharges with triphasic morphology. Occasionally represented better lateralized but favored to represent generalized pathology. Activation Procedures Hyperventilation: Not performed Photic Stimulation: Not performed Classification: Abnormal : Diagnosis: This is abnormal EEG. The presence of mild diffuse slowing and intermixed generalized triphasic waves is consistent with nonspecific generalized neuronal dysfunction and can be seen with metabolic encephalopathy. No seizures are noted. Dr Apolinar [facility] [phone] Case [id number]   DG HIP UNILAT WITH PELVIS 2-3 VIEWS LEFT  Result Date: 07/29/2021 CLINICAL DATA:  Hip pain EXAM: DG HIP (WITH OR WITHOUT PELVIS) 2-3V LEFT COMPARISON:  None. FINDINGS: The LEFT hip is located. No femoral neck fracture. Minimal arthropathy. IMPRESSION: No LEFT femoral neck fracture Electronically Signed   By: Suzy Bouchard M.D.   On: 07/29/2021 16:13    Microbiology: Recent Results (from the  past 240 hour(s))  Resp Panel by RT-PCR (Flu A&B, Covid) Nasopharyngeal Swab     Status: None   Collection Time: 07/22/21  6:16 PM   Specimen: Nasopharyngeal Swab; Nasopharyngeal(NP) swabs in vial transport medium  Result Value Ref Range Status   SARS Coronavirus 2 by RT PCR NEGATIVE NEGATIVE Final    Comment: (NOTE) SARS-CoV-2 target nucleic acids are NOT DETECTED.  The SARS-CoV-2 RNA is generally detectable in upper respiratory specimens during the acute phase of infection. The lowest concentration of SARS-CoV-2 viral copies this assay can detect is 138 copies/mL. A negative result does not preclude SARS-Cov-2 infection and should not be used as the sole basis for treatment or other patient management decisions. A negative result may occur with  improper specimen collection/handling, submission of specimen other than nasopharyngeal swab, presence of viral mutation(s) within the areas targeted by this assay, and inadequate  number of viral copies(<138 copies/mL). A negative result must be combined with clinical observations, patient history, and epidemiological information. The expected result is Negative.  Fact Sheet for Patients:  EntrepreneurPulse.com.au  Fact Sheet for Healthcare Providers:  IncredibleEmployment.be  This test is no t yet approved or cleared by the [place] FDA and  has been authorized for detection and/or diagnosis of SARS-CoV-2 by FDA under an Emergency Use Authorization (EUA). This EUA will remain  in effect (meaning this test can be used) for the duration of the COVID-19 declaration under Section 564(b)(1) of the Act, 21 U.S.C.section 360bbb-3(b)(1), unless the authorization is terminated  or revoked sooner.       Influenza A by PCR NEGATIVE NEGATIVE Final   Influenza B by PCR NEGATIVE NEGATIVE Final    Comment: (NOTE) The Xpert Xpress SARS-CoV-2/FLU/RSV plus assay is intended as an aid in the diagnosis of  influenza from Nasopharyngeal swab specimens and should not be used as a sole basis for treatment. Nasal washings and aspirates are unacceptable for Xpert Xpress SARS-CoV-2/FLU/RSV testing.  Fact Sheet for Patients: EntrepreneurPulse.com.au  Fact Sheet for Healthcare Providers: IncredibleEmployment.be  This test is not yet approved or cleared by the [place] FDA and has been authorized for detection and/or diagnosis of SARS-CoV-2 by FDA under an Emergency Use Authorization (EUA). This EUA will remain in effect (meaning this test can be used) for the duration of the COVID-19 declaration under Section 564(b)(1) of the Act, 21 U.S.C. section 360bbb-3(b)(1), unless the authorization is terminated or revoked.  Performed at [facility], [place] [address] [place], [place] [address]   Culture, blood (Routine x 2)     Status: None   Collection Time: 07/22/21  6:38 PM   Specimen: BLOOD  Result Value Ref Range Status   Specimen Description BLOOD LEFT ANTECUBITAL  Final   Special Requests   Final    BOTTLES DRAWN AEROBIC AND ANAEROBIC Blood Culture results may not be optimal due to an inadequate volume of blood received in culture bottles   Culture   Final    NO GROWTH 5 DAYS Performed at [facility], [place] [address], [place], [place] [address]    Report Status 07/27/2021 FINAL  Final  Urine Culture     Status: None   Collection Time: 07/22/21  8:38 PM   Specimen: In/Out Cath Urine  Result Value Ref Range Status   Specimen Description IN/OUT CATH URINE  Final   Special Requests NONE  Final   Culture   Final    NO GROWTH Performed at [facility], [place] [address] [place], [place] [address]    Report Status 07/23/2021 FINAL  Final  MRSA Next Gen by PCR, Nasal     Status: None   Collection Time: 07/24/21 10:36 AM   Specimen: Nasal Mucosa; Nasal Swab  Result Value Ref Range Status   MRSA by PCR Next Gen NOT DETECTED NOT  DETECTED Final    Comment: (NOTE) The GeneXpert MRSA Assay (FDA approved for NASAL specimens only), is one component of a comprehensive MRSA colonization surveillance program. It is not intended to diagnose MRSA infection nor to guide or monitor treatment for MRSA infections. Test performance is not FDA approved in patients less than 69 years old. Performed at [facility], [place] [address], [place], [place] [address]   Culture, blood (routine x 2)     Status: None (Preliminary result)   Collection Time: 07/28/21 12:04 PM  Specimen: BLOOD LEFT HAND  Result Value Ref Range Status   Specimen Description BLOOD LEFT HAND  Final   Special Requests   Final    BOTTLES DRAWN AEROBIC AND ANAEROBIC Blood Culture adequate volume   Culture   Final    NO GROWTH 3 DAYS Performed at [facility], [address], [place], [place] [address]    Report Status PENDING  Incomplete  Culture, blood (routine x 2)     Status: None (Preliminary result)   Collection Time: 07/28/21 12:14 PM   Specimen: BLOOD RIGHT HAND  Result Value Ref Range Status   Specimen Description BLOOD RIGHT HAND  Final   Special Requests   Final    BOTTLES DRAWN AEROBIC AND ANAEROBIC Blood Culture adequate volume   Culture   Final    NO GROWTH 3 DAYS Performed at [facility], [place] [address], [place], [place] [address]    Report Status PENDING  Incomplete     Labs: Basic Metabolic Panel: Recent Labs  Lab 07/25/21 0226 07/26/21 2244 07/27/21 1133 07/28/21 0113 07/29/21 0726 07/30/21 0547  NA 139 134*  --  136 136  --   K 3.5 3.1* 4.0 3.3* 3.3*  --   CL 103 100  --  102 102  --   CO2 28 23  --  25 26  --   GLUCOSE 100* 113*  --  97 100*  --   BUN 21 29*  --  18 11  --   CREATININE 1.11 1.25*  --  0.89 0.86 0.90  CALCIUM 9.2 8.8*  --  8.6* 8.8*  --   MG  --  1.8  --   --  1.7  --    Liver Function Tests: No results for input(s): AST, ALT, ALKPHOS, BILITOT, PROT, ALBUMIN in the last 168 hours. No  results for input(s): LIPASE, AMYLASE in the last 168 hours. No results for input(s): AMMONIA in the last 168 hours. CBC: Recent Labs  Lab 07/26/21 2244 07/29/21 0726  WBC 11.8* 11.4*  HGB 12.1* 11.1*  HCT 36.2* 33.7*  MCV 91.2 91.3  PLT 165 159   Cardiac Enzymes: No results for input(s): CKTOTAL, CKMB, CKMBINDEX, TROPONINI in the last 168 hours. BNP: BNP (last 3 results) Recent Labs    07/23/21 0410  BNP 310.6*    ProBNP (last 3 results) No results for input(s): PROBNP in the last 8760 hours.  CBG: Recent Labs  Lab 07/26/21 2037 07/28/21 1011  GLUCAP 117* 130*    Principal Problem:   SIRS (systemic inflammatory response syndrome) (HCC) Active Problems:   History of tongue cancer   Orthostatic hypotension   Hypothyroidism   MGUS (monoclonal gammopathy of unknown significance)   Esophageal stricture   Dementia (HCC)   Pressure injury of skin   Syncope   Time coordinating discharge: 38 minutes.  Signed:        Sharmila Wrobleski, DO [facility]  07/31/2021, 5:25 PM

## 2021-07-31 NOTE — Progress Notes (Addendum)
Pharmacy Antibiotic Note  George Bradley is a 80 y.o. male admitted on 07/22/2021 with sepsis. Patient presented from home with generalized weakness (hx dementia), and received IV Vancomycin and Cefepime.   Pt remains on D#4 Unasyn for possible aspiration event 11/6. Cr stable.  Plan: Unasyn 3g IV q6h - 5 days total per Dr Benny Lennert then 2 days of Augmentin F/u clinical status, cultures, LOT, abx plan.   Height: 6\' 1"  (185.4 cm) Weight: 73.6 kg (162 lb 4.1 oz) IBW/kg (Calculated) : 79.9  Temp (24hrs), Avg:98.1 F (36.7 C), Min:98 F (36.7 C), Max:98.3 F (36.8 C)  Recent Labs  Lab 07/25/21 0226 07/26/21 2244 07/28/21 0113 07/29/21 0726 07/30/21 0547  WBC  --  11.8*  --  11.4*  --   CREATININE 1.11 1.25* 0.89 0.86 0.90     Estimated Creatinine Clearance: 68.1 mL/min (by C-G formula based on SCr of 0.9 mg/dL).    Allergies  Allergen Reactions   Doxepin Other (See Comments)    Left-sided weakness appeared the morning after this was first taken   Zetia [Ezetimibe] Other (See Comments)    "made me feel bad"    Antimicrobials this admission: Metronidazole 10/31  Vancomycin 10/31 >> 11/1 Cefepime 10/31 >> 11/4 Unasyn 11/6 >>  Dose adjustments this admission: N/A  Microbiology results: 11/6 BCx: ordered, needs to be collected 10/31 BCx: ngtd 10/31 UCx: negative  11/2 MRSA PCR: not detected   Thank you for allowing pharmacy to be a part of this patient's care.  Arrie Senate, PharmD, BCPS, [facility] Clinical Pharmacist [phone] Please check AMION for all [place] numbers 07/31/2021

## 2021-07-31 NOTE — TOC Initial Note (Signed)
Transition of Care [facility]) - Initial/Assessment Note    Patient Details  Name: George Bradley MRN: [id number] Date of Birth: 09/20/1941  Transition of [facility]) CM/SW Contact:    Bethena Roys, RN Phone Number: 07/31/2021, 2:02 PM  Clinical Narrative:  Risk for readmission assessment completed. Prior to arrival patient was from home with spouse. Case Manager spoke with family on yesterday and discussed hospice in the home vs CIR. Patients spouse wanted to speak with [place] spoke to both facility liaisons. After speaking with family post meeting with hospice facilities, the patient and family has decided to go to inpatient rehab. Case Manager called CIR liaison Mickel Baas on yesterday to see if they had bed availability. CIR liaison has bed available today. Plan for transition to CIR today.                    Expected Discharge Plan: IP Rehab Facility Barriers to Discharge: No Barriers Identified   Patient Goals and CMS Choice Patient states their goals for this hospitalization and ongoing recovery are:: to go to inpatient rehab   Choice offered to / list presented to : NA  Expected Discharge Plan and Services Expected Discharge Plan: [place] In-house Referral: Clinical Social Work, Hospice / Palliative Care Discharge Planning Services: CM Consult Post Acute Care Choice: IP Rehab Living arrangements for the past 2 months: Single Family Home Expected Discharge Date: 07/26/21                 DME Agency: NA       HH Arranged: NA     Prior Living Arrangements/Services Living arrangements for the past 2 months: Single Family Home Lives with:: Spouse Patient language and need for interpreter reviewed:: Yes Do you feel safe going back to the place where you live?: Yes      Need for Family Participation in Patient Care: Yes (Comment) Care giver support system in place?: Yes (comment)   Criminal Activity/Legal  Involvement Pertinent to Current Situation/Hospitalization: No - Comment as needed  Activities of Daily Living      Permission Sought/Granted Permission sought to share information with : Family Supports, Customer service manager, Case Manager Permission granted to share information with : Yes, Verbal Permission Granted       Emotional Assessment Appearance:: Appears stated age Attitude/Demeanor/Rapport: Engaged Affect (typically observed): Appropriate Orientation: : Oriented to Place, Oriented to Self, Oriented to  Time, Oriented to Situation Alcohol / Substance Use: Not Applicable Psych Involvement: No (comment)  Admission diagnosis:  SOB (shortness of breath) [R06.02] Hypoxia [R09.02] SIRS (systemic inflammatory response syndrome) (HCC) [R65.10] Febrile illness [R50.9] Sepsis, due to unspecified organism, unspecified whether acute organ dysfunction present [facility]) [A41.9] Patient Active Problem List   Diagnosis Date Noted   Syncope    Pressure injury of skin 07/25/2021   Hypoxia    SIRS (systemic inflammatory response syndrome) ([place]) 07/22/2021   Lobar pneumonia ([place]) 06/16/2021   HCAP (healthcare-associated pneumonia) 06/15/2021   Hypoalbuminemia due to protein-calorie malnutrition ([place]) 06/15/2021   Prolonged QT interval 06/15/2021   Altered mental status    AKI (acute kidney injury) ([place]) 05/30/2021   Insomnia 05/28/2021   Protein-calorie malnutrition, severe 04/29/2021   Weakness 04/28/2021   Left-sided weakness 04/27/2021   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   Gait disturbance 04/01/2021   Dysesthesia 04/01/2021   Sepsis due to undetermined organism ([place]) 01/24/2021   Aspiration pneumonitis ([place]) 01/23/2021   Pneumonia 01/21/2021  Sepsis ([place]) 12/26/2020   Acute respiratory failure with hypoxia ([place]) 12/26/2020   Aspiration pneumonia ([place]) 12/26/2020   Dementia ([place]) 12/26/2020   Acute encephalopathy 12/26/2020   Cerebrovascular accident (CVA)  ([place]) 12/19/2020   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   Spinal stenosis of lumbosacral region 11/13/2020   Numbness 11/13/2020   Weakness of both lower extremities 11/13/2020   Esophageal stricture 07/05/2019   Dizziness 08/18/2017   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   Constipation 03/23/2014   Dysphagia 03/23/2014   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   Xerostomia 06/10/2013   Thrombocytopenia ([place]) 06/10/2013   S/P radiation therapy    Hypothyroidism 05/27/2012   Orthostatic hypotension 05/11/2012   Epigastric pain 05/11/2012   History of tongue cancer 11/17/2011   Depression 10/01/2011   Renal artery stenosis, native, bilateral ([place]) 09/03/2011   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   Angina pectoris ([place]) 02/20/2011   Hypercholesterolemia 02/20/2011   Aortic valve stenosis, mild 02/20/2011   PCP:  [name], MD Pharmacy:   [facility] [place], [place] - [address] [place] [address] [place] [address] Phone: [phone] Fax: [phone]   Readmission Risk Interventions Readmission Risk Prevention Plan 07/31/2021 06/17/2021  Transportation Screening Complete Complete  PCP or Specialist Appt within 3-5 Days Complete -  HRI or [place] Complete Complete  Social Work Consult for [place] Planning/Counseling Complete Complete  Palliative Care Screening Complete Complete  Medication Review Press photographer) Complete Complete  Some recent data might be hidden

## 2021-07-31 NOTE — Plan of Care (Signed)
  Problem: Clinical Measurements: Goal: Ability to maintain clinical measurements within normal limits will improve Outcome: Progressing   Problem: Clinical Measurements: Goal: Diagnostic test results will improve Outcome: Progressing   Problem: Clinical Measurements: Goal: Respiratory complications will improve Outcome: Progressing   Problem: Clinical Measurements: Goal: Cardiovascular complication will be avoided Outcome: Progressing   Problem: Activity: Goal: Risk for activity intolerance will decrease Outcome: Progressing   Problem: Nutrition: Goal: Adequate nutrition will be maintained Outcome: Progressing   Problem: Pain Managment: Goal: General experience of comfort will improve Outcome: Progressing   Problem: Safety: Goal: Ability to remain free from injury will improve Outcome: Progressing   Problem: Skin Integrity: Goal: Risk for impaired skin integrity will decrease Outcome: Progressing
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
## 2021-08-01 DIAGNOSIS — R6510 Systemic inflammatory response syndrome (SIRS) of non-infectious origin without acute organ dysfunction: Secondary | ICD-10-CM | POA: Diagnosis not present

## 2021-08-01 LAB — CBC WITH DIFFERENTIAL/PLATELET
Abs Immature Granulocytes: 0.09 10*3/uL — ABNORMAL HIGH (ref 0.00–0.07)
Basophils Absolute: 0.1 10*3/uL (ref 0.0–0.1)
Basophils Relative: 0 %
Eosinophils Absolute: 0.8 10*3/uL — ABNORMAL HIGH (ref 0.0–0.5)
Eosinophils Relative: 6 %
HCT: 36 % — ABNORMAL LOW (ref 39.0–52.0)
Hemoglobin: 11.8 g/dL — ABNORMAL LOW (ref 13.0–17.0)
Immature Granulocytes: 1 %
Lymphocytes Relative: 15 %
Lymphs Abs: 2.1 10*3/uL (ref 0.7–4.0)
MCH: 29.8 pg (ref 26.0–34.0)
MCHC: 32.8 g/dL (ref 30.0–36.0)
MCV: 90.9 fL (ref 80.0–100.0)
Monocytes Absolute: 1.2 10*3/uL — ABNORMAL HIGH (ref 0.1–1.0)
Monocytes Relative: 9 %
Neutro Abs: 9.4 10*3/uL — ABNORMAL HIGH (ref 1.7–7.7)
Neutrophils Relative %: 69 %
Platelets: 254 10*3/uL (ref 150–400)
RBC: 3.96 MIL/uL — ABNORMAL LOW (ref 4.22–5.81)
RDW: 14.7 % (ref 11.5–15.5)
WBC: 13.6 10*3/uL — ABNORMAL HIGH (ref 4.0–10.5)
nRBC: 0 % (ref 0.0–0.2)

## 2021-08-01 LAB — COMPREHENSIVE METABOLIC PANEL
ALT: 70 U/L — ABNORMAL HIGH (ref 0–44)
AST: 69 U/L — ABNORMAL HIGH (ref 15–41)
Albumin: 2.4 g/dL — ABNORMAL LOW (ref 3.5–5.0)
Alkaline Phosphatase: 44 U/L (ref 38–126)
Anion gap: 8 (ref 5–15)
BUN: 14 mg/dL (ref 8–23)
CO2: 29 mmol/L (ref 22–32)
Calcium: 8.5 mg/dL — ABNORMAL LOW (ref 8.9–10.3)
Chloride: 98 mmol/L (ref 98–111)
Creatinine, Ser: 0.98 mg/dL (ref 0.61–1.24)
GFR, Estimated: >60 mL/min (ref >60)
Glucose, Bld: 103 mg/dL — ABNORMAL HIGH (ref 70–99)
Potassium: 3.3 mmol/L — ABNORMAL LOW (ref 3.5–5.1)
Sodium: 135 mmol/L (ref 135–145)
Total Bilirubin: 0.5 mg/dL (ref 0.3–1.2)
Total Protein: 6.8 g/dL (ref 6.5–8.1)

## 2021-08-01 MED ORDER — POTASSIUM CHLORIDE 20 MEQ PO PACK
40.0000 meq | PACK | Freq: Once | ORAL | Status: AC
  Administered 2021-08-01: 40 meq via ORAL
  Filled 2021-08-01: qty 2

## 2021-08-01 MED ORDER — DOCUSATE SODIUM 100 MG PO CAPS: 100.0000 mg | ORAL_CAPSULE | Freq: Every day | ORAL | Status: DC | PRN

## 2021-08-01 MED ORDER — DULOXETINE HCL 20 MG PO CPEP
20.0000 mg | ORAL_CAPSULE | Freq: Every day | ORAL | Status: DC
  Administered 2021-08-01 – 2021-08-09 (×9): 20 mg via ORAL
  Filled 2021-08-01 (×10): qty 1

## 2021-08-01 NOTE — Progress Notes (Signed)
Inpatient Rehabilitation Admission Medication Review by a Pharmacist  A complete drug regimen review was completed for this patient to identify any potential clinically significant medication issues.  High Risk Drug Classes Is patient taking? 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    Other       Clinically significant medication issues were identified that warrant physician communication and completion of prescribed/recommended actions by midnight of the next day:  No   Pharmacist comments: ? Resume statin  Time spent performing this drug regimen review (minutes):  20 minutes   Tad Moore 08/01/2021 8:11 AM

## 2021-08-01 NOTE — Progress Notes (Signed)
[facility] Individual Statement of Services  Patient Name:  George Bradley  Date:  08/01/2021  Welcome to the [facility].  Our goal is to provide you with an individualized program based on your diagnosis and situation, designed to meet your specific needs.  With this comprehensive rehabilitation program, you will be expected to participate in at least 3 hours of rehabilitation therapies Monday-Friday, with modified therapy programming on the weekends.  Your rehabilitation program will include the following services:  Physical Therapy (PT), Occupational Therapy (OT), Speech Therapy (ST), 24 hour per day rehabilitation nursing, Neuropsychology, Care Coordinator, Rehabilitation Medicine, Nutrition Services, and Pharmacy Services  Weekly team conferences will be held on Tuesday to discuss your progress.  Your Inpatient Rehabilitation Care Coordinator will talk with you frequently to get your input and to update you on team discussions.  Team conferences with you and your family in attendance may also be held.  Expected length of stay: 7-10 days  Overall anticipated outcome: CGA-supervision level  Depending on your progress and recovery, your program may change. Your Inpatient Rehabilitation Care Coordinator will coordinate services and will keep you informed of any changes. Your Inpatient Rehabilitation Care Coordinator's name and contact numbers are listed  below.  The following services may also be recommended but are not provided by the [facility]:   [place] will be made to provide these services after discharge if needed.  Arrangements include referral to agencies that provide these services.  Your insurance has been verified to be:  [employer] Your primary doctor is:  [name]  Pertinent information will be shared with your doctor and your  insurance company.  Inpatient Rehabilitation Care Coordinator:  Ovidio Kin, [place] or Emilia Beck  Information discussed with and copy given to patient by: Elease Hashimoto, 08/01/2021, 11:34 AM
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
## 2021-08-01 NOTE — Discharge Instructions (Addendum)
Inpatient Rehab Discharge Instructions  COE ANGELOS Discharge date and time: No discharge date for patient encounter.   Activities/Precautions/ Functional Status: Activity: activity as tolerated Diet: Soft Wound Care: Routine skin checks Functional status:  ___ No restrictions     ___ Walk up steps independently ___ 24/7 supervision/assistance   ___ Walk up steps with assistance ___ Intermittent supervision/assistance  ___ Bathe/dress independently ___ Walk with walker     __x_ Bathe/dress with assistance ___ Walk Independently    ___ Shower independently ___ Walk with assistance    ___ Shower with assistance ___ No alcohol     ___ Return to work/school ________  Special Instructions: No driving smoking or alcohol    COMMUNITY REFERRALS UPON DISCHARGE:   [place]  PHONE: [phone]   Medical Equipment/Items Ordered:HAS ALL NEEDED EQUIPMENT REFUSES TO USE AT HOME                                                 Agency/Supplier: NA   My questions have been answered and I understand these instructions. I will adhere to these goals and the provided educational materials after my discharge from the hospital.  Patient/Caregiver Signature _______________________________ Date __________  Clinician Signature _______________________________________ Date __________  Please bring this form and your medication list with you to all your follow-up doctor's appointments.

## 2021-08-01 NOTE — Progress Notes (Signed)
Inpatient Rehabilitation  Patient information reviewed and entered into eRehab system by Beatris Belen M. Kijuana Ruppel, M.A., CCC/SLP, PPS Coordinator.  Information including medical coding, functional ability and quality indicators will be reviewed and updated through discharge.    

## 2021-08-01 NOTE — Progress Notes (Signed)
PROGRESS NOTE   Subjective/Complaints: Complains that his peripheral neuropathy is so severe that he sometimes he feels his legs should be amputated. He would like to avoid PEG Discussed his childhood and what a great father he had  ROS: +severe bilateral lower extremity peripheral neuropathy   Objective:   No results found. Recent Labs    07/31/21 1822 08/01/21 0509  WBC 10.9* 13.6*  HGB 11.7* 11.8*  HCT 36.3* 36.0*  PLT 250 254   Recent Labs    07/31/21 1822 08/01/21 0509  NA  --  135  K  --  3.3*  CL  --  98  CO2  --  29  GLUCOSE  --  103*  BUN  --  14  CREATININE 1.05 0.98  CALCIUM  --  8.5*    Intake/Output Summary (Last 24 hours) at 08/01/2021 1718 Last data filed at 08/01/2021 1219 Gross per 24 hour  Intake 140 ml  Output 600 ml  Net -460 ml     Pressure Injury 07/24/21 Sacrum Mid Stage 1 -  Intact skin with non-blanchable redness of a localized area usually over a bony prominence. (Active)  07/24/21 0450  Location: Sacrum  Location Orientation: Mid  Staging: Stage 1 -  Intact skin with non-blanchable redness of a localized area usually over a bony prominence.  Wound Description (Comments):   Present on Admission: Yes    Physical Exam: Vital Signs Blood pressure 129/75, pulse 72, temperature 98.1 F (36.7 C), resp. rate 17, SpO2 96 %. Gen: no distress, normal appearing HEENT: oral mucosa pink and moist, NCAT Cardio: Reg rate Chest: normal effort, normal rate of breathing Abd: soft, non-distended Ext: no edema Physical Exam Neurological:     Comments: Patient is alert.  Makes eye contact with examiner.  Follows simple commands.  Provides name and age.  Limited medical historian. LLE 3/5, RLE 5/5 upper extremities 5/5 strength   Assessment/Plan: 1. Functional deficits which require 3+ hours per day of interdisciplinary therapy in a comprehensive inpatient rehab setting. Physiatrist is  providing close team supervision and 24 hour management of active medical problems listed below. Physiatrist and rehab team continue to assess barriers to discharge/monitor patient progress toward functional and medical goals  Care Tool:  Bathing    Body parts bathed by patient: Right arm, Left arm, Chest, Abdomen, Front perineal area, Buttocks, Right upper leg, Left upper leg, Face   Body parts bathed by helper: Right lower leg, Left lower leg     Bathing assist Assist Level: Minimal Assistance - Patient > 75%     Upper Body Dressing/Undressing Upper body dressing   What is the patient wearing?: Pull over shirt    Upper body assist Assist Level: Set up assist    Lower Body Dressing/Undressing Lower body dressing      What is the patient wearing?: Underwear/pull up, Pants     Lower body assist Assist for lower body dressing: Maximal Assistance - Patient 25 - 49%     Toileting Toileting    Toileting assist Assist for toileting: Maximal Assistance - Patient 25 - 49%     Transfers Chair/bed transfer  Transfers assist  Chair/bed transfer assist level: Minimal Assistance - Patient > 75%     Locomotion Ambulation   Ambulation assist      Assist level: Contact Guard/Touching assist Assistive device: Walker-rolling Max distance: 5'   Walk 10 feet activity   Assist  Walk 10 feet activity did not occur: Safety/medical concerns (Fatigue, pain)        Walk 50 feet activity   Assist Walk 50 feet with 2 turns activity did not occur: Safety/medical concerns (Fatigue, pain)         Walk 150 feet activity   Assist Walk 150 feet activity did not occur: Safety/medical concerns (Fatigue, pain)         Walk 10 feet on uneven surface  activity   Assist Walk 10 feet on uneven surfaces activity did not occur: Safety/medical concerns (Fatigue, pain)         Wheelchair     Assist Is the patient using a wheelchair?: Yes Type of Wheelchair:  Manual    Wheelchair assist level: Supervision/Verbal cueing Max wheelchair distance: 25'    Wheelchair 50 feet with 2 turns activity    Assist        Assist Level: Supervision/Verbal cueing   Wheelchair 150 feet activity     Assist      Assist Level: Moderate Assistance - Patient 50 - 74%   Blood pressure 129/75, pulse 72, temperature 98.1 F (36.7 C), resp. rate 17, SpO2 96 %.    Medical Problem List and Plan: 1.  Debility secondary to SIRS/sepsis.  Completing course of Maxipime             -patient may shower             -ELOS/Goals: S 7-10 days             Initial CIR evaluations today. 2.  Antithrombotics: -DVT/anticoagulation: Lovenox             -antiplatelet therapy: Aspirin 81 mg daily and Brilinta 90 mg twice daily 3. Chemotherapy induced peripheral neuropathy: Increase Neurontin to 1,000 mg 3 times daily oxycodone as needed. Cymbalta 20mg  daily prescribed.  4. Anxiety: Aricept 5 mg nightly, continue Ativan 1 mg 3 times daily             -antipsychotic agents: N/A 5. Neuropsych: This patient is capable of making decisions on his own behalf. 6. Skin/Wound Care: Routine skin checks 7. Fluids/Electrolytes/Nutrition: Routine in and outs with follow-up chemistries 8.  CHRONIC Orthostasis.  Florinef 0.1 mg daily.  Monitor with increased mobility 9.  History of CVA with right-sided carotid occlusion.  Continue aspirin and Brilinta 10.  Hypothyroidism.  Synthroid 11.  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  Follow-up outpatient 12.  H/o of hyperlipidemia.  LDL 20, HDL 45, VLDL 9, total cholesterol 91, TG 47. No current evidence of hyperlipidemia so will d/c Crestor 13. Recurrent aspiration pneumonias: patient prefers to consume oral food and defers PEG, discussed with patient and SLP  LOS: 1 days A FACE TO FACE EVALUATION  WAS PERFORMED  Martha Clan P Yitta Gongaware 08/01/2021, 5:18 PM
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
## 2021-08-01 NOTE — Plan of Care (Signed)
  Problem: RH Balance Goal: LTG Patient will maintain dynamic standing balance (PT) Description: LTG:  Patient will maintain dynamic standing balance with assistance during mobility activities (PT) Flowsheets (Taken 08/01/2021 1232) LTG: Pt will maintain dynamic standing balance during mobility activities with:: (LRAD) Supervision/Verbal cueing   Problem: Sit to Stand Goal: LTG:  Patient will perform sit to stand with assistance level (PT) Description: LTG:  Patient will perform sit to stand with assistance level (PT) Flowsheets (Taken 08/01/2021 1232) LTG: PT will perform sit to stand in preparation for functional mobility with assistance level: (LRAD) Supervision/Verbal cueing   Problem: RH Bed Mobility Goal: LTG Patient will perform bed mobility with assist (PT) Description: LTG: Patient will perform bed mobility with assistance, with/without cues (PT). Flowsheets (Taken 08/01/2021 1232) LTG: Pt will perform bed mobility with assistance level of: Independent with assistive device    Problem: RH Bed to Chair Transfers Goal: LTG Patient will perform bed/chair transfers w/assist (PT) Description: LTG: Patient will perform bed to chair transfers with assistance (PT). Flowsheets (Taken 08/01/2021 1232) LTG: Pt will perform Bed to Chair Transfers with assistance level: (LRAD) Supervision/Verbal cueing   Problem: RH Car Transfers Goal: LTG Patient will perform car transfers with assist (PT) Description: LTG: Patient will perform car transfers with assistance (PT). Flowsheets (Taken 08/01/2021 1232) LTG: Pt will perform car transfers with assist:: (LRAD) Supervision/Verbal cueing   Problem: RH Ambulation Goal: LTG Patient will ambulate in home environment (PT) Description: LTG: Patient will ambulate in home environment, # of feet with assistance (PT). Flowsheets (Taken 08/01/2021 1232) LTG: Pt will ambulate in home environ  assist needed:: (LRAD) Supervision/Verbal cueing LTG:  Ambulation distance in home environment: 50'   Problem: RH Wheelchair Mobility Goal: LTG Patient will propel w/c in controlled environment (PT) Description: LTG: Patient will propel wheelchair in controlled environment, # of feet with assist (PT) Flowsheets (Taken 08/01/2021 1232) LTG: Pt will propel w/c in controlled environ  assist needed:: Supervision/Verbal cueing LTG: Propel w/c distance in controlled environment: 150' Goal: LTG Patient will propel w/c in home environment (PT) Description: LTG: Patient will propel wheelchair in home environment, # of feet with assistance (PT). Flowsheets (Taken 08/01/2021 1232) LTG: Pt will propel w/c in home environ  assist needed:: Supervision/Verbal cueing Distance: wheelchair distance in controlled environment: 50 LTG: Propel w/c distance in home environment: 50   Problem: RH Stairs Goal: LTG Patient will ambulate up and down stairs w/assist (PT) Description: LTG: Patient will ambulate up and down # of stairs with assistance (PT) Flowsheets (Taken 08/01/2021 1232) LTG: Pt will ambulate up/down stairs assist needed:: Contact Guard/Touching assist LTG: Pt will  ambulate up and down number of stairs: 4

## 2021-08-02 LAB — CBC WITH DIFFERENTIAL/PLATELET
Abs Immature Granulocytes: 0.06 10*3/uL (ref 0.00–0.07)
Basophils Absolute: 0.1 10*3/uL (ref 0.0–0.1)
Basophils Relative: 1 %
Eosinophils Absolute: 0.6 10*3/uL — ABNORMAL HIGH (ref 0.0–0.5)
Eosinophils Relative: 6 %
HCT: 34.9 % — ABNORMAL LOW (ref 39.0–52.0)
Hemoglobin: 11.5 g/dL — ABNORMAL LOW (ref 13.0–17.0)
Immature Granulocytes: 1 %
Lymphocytes Relative: 15 %
Lymphs Abs: 1.6 10*3/uL (ref 0.7–4.0)
MCH: 30.2 pg (ref 26.0–34.0)
MCHC: 33 g/dL (ref 30.0–36.0)
MCV: 91.6 fL (ref 80.0–100.0)
Monocytes Absolute: 1.1 10*3/uL — ABNORMAL HIGH (ref 0.1–1.0)
Monocytes Relative: 10 %
Neutro Abs: 6.9 10*3/uL (ref 1.7–7.7)
Neutrophils Relative %: 67 %
Platelets: 254 10*3/uL (ref 150–400)
RBC: 3.81 MIL/uL — ABNORMAL LOW (ref 4.22–5.81)
RDW: 14.9 % (ref 11.5–15.5)
WBC: 10.3 10*3/uL (ref 4.0–10.5)
nRBC: 0 % (ref 0.0–0.2)

## 2021-08-02 LAB — BASIC METABOLIC PANEL
Anion gap: 7 (ref 5–15)
BUN: 13 mg/dL (ref 8–23)
CO2: 29 mmol/L (ref 22–32)
Calcium: 8.7 mg/dL — ABNORMAL LOW (ref 8.9–10.3)
Chloride: 100 mmol/L (ref 98–111)
Creatinine, Ser: 1.04 mg/dL (ref 0.61–1.24)
GFR, Estimated: >60 mL/min (ref >60)
Glucose, Bld: 101 mg/dL — ABNORMAL HIGH (ref 70–99)
Potassium: 3.9 mmol/L (ref 3.5–5.1)
Sodium: 136 mmol/L (ref 135–145)

## 2021-08-02 LAB — CULTURE, BLOOD (ROUTINE X 2)
Culture: NO GROWTH
Culture: NO GROWTH
Special Requests: ADEQUATE
Special Requests: ADEQUATE

## 2021-08-02 NOTE — Progress Notes (Signed)
Occupational Therapy Session Note  Patient Details  Name: George Bradley MRN: [id number] Date of Birth: Jan 09, 1941  Today's Date: 08/02/2021 OT Individual Time: 1300-1356 OT Individual Time Calculation (min): 56 min    Short Term Goals: Week 1:  OT Short Term Goal 1 (Week 1): STGs = LTGs  Skilled Therapeutic Interventions/Progress Updates:  Pt greeted supine in bed agreeable to OT intervention. Session focus on therapeutic activities focused on dynamic standing balance, BLE strength and endurance, acitivity tolerance and BUE/core global strengthening and endurance. Pt completed bed mobility with CGA. Pt reports dizziness once sitting EOB BP 94/63 with TEDS donned. Pt on 1L [place] with SpO2 93% at rest. CGA for stand pivot transfer from EOB>w/c with rw. Pt transported to gym with total A. Pt completed dynamic standing balance task on compliant and non compliant surfaces with CGA- MIN A with unilateral support on Rw. Pt instructed to reach out of BOS to retrieve bean bags and toss bags to target. Pt additionally completed BLE strength and endurance task where pt instructed to step up on aerobic step to facilitate improved reciprocal stepping and improve BLE strength and endurance for higher level functional mobility tasks. Pt completed first trial with CGA with pt able to complete task for 1 min and 15 secs before fatiguing. SpO2 95% on 1L post activity. Pt completed second trial for 1 min and 30 secs before fatiguing. SpO2 87% post activity however SpO2 rebounded to >90% after 30 secs of pursed lip breathing and seated rest break. Pt additionally completed seated BUE/ core therex with an emphasis on increasing overall activity tolerance. Pt completed 2x10 chest presses, bicep curls and russian twists with 3lb weighted ball from sitting in w/c. Pt transported back to room with total A d/t fatigue, CGA for stand pivot transfer from w/c>EOB with Rw.  pt left supine in bed with bed alarm activated and all needs  within reach.                   Therapy Documentation Precautions:  Precautions Precautions: Fall, Other (comment) Precaution Comments: monitor vitals Restrictions Weight Bearing Restrictions: No    Pain: pt reports unrated pain in hips during standing, provided rest breaks when needed    Therapy/Group: Individual Therapy  Corinne Ports [facility] 08/02/2021, 2:00 PM

## 2021-08-02 NOTE — Progress Notes (Signed)
Occupational Therapy Session Note  Patient Details  Name: George Bradley MRN: [id number] Date of Birth: Jun 06, 1941  Today's Date: 08/02/2021 OT Individual Time: 1030-1120 OT Individual Time Calculation (min): 50 min    Short Term Goals: Week 1:  OT Short Term Goal 1 (Week 1): STGs = LTGs  Skilled Therapeutic Interventions/Progress Updates:    Pt received in room with wife in room. She clarified that he was walking independently, going up and down stairs and doing all ADLs independently, BUT he did have frequently falls due to syncope.  She did agree with recommendations for supervision to CGA level with mobility and self care to prevent falls.   Therapy session focused on furniture transfers and shower stall transfers.  She drew a layout of their bathroom and pt taken to ADL apt to practice.  Bed mobility out of bed S, sit to stand from bed, LOW couch and shower chair all S.  Pt practiced stepping backwards into shower stall with cues.  Practiced reaching for where grab bars would need to be placed.  She will measure height of built in shower seat and toilet.   Pt did well ambulating from room to ADL both times with CGA on 1L of O2. Resting in recliner with belt alarm on and all needs met.   Therapy Documentation Precautions:  Precautions Precautions: Fall, Other (comment) Precaution Comments: monitor vitals Restrictions Weight Bearing Restrictions: No Vital Signs:  Lowered O2 to 1L - 97% O2 sats Pain:  Minimal c/o pain in B feet, much improved from yesterday ADL: ADL Eating: Set up Grooming: Setup Where Assessed-Grooming: Sitting at sink Upper Body Bathing: Setup Where Assessed-Upper Body Bathing: Sitting at sink Lower Body Bathing: Minimal assistance Where Assessed-Lower Body Bathing: Sitting at sink, Standing at sink Upper Body Dressing: Setup Where Assessed-Upper Body Dressing: Sitting at sink Lower Body Dressing: Maximal assistance Where Assessed-Lower Body Dressing:  Sitting at sink, Standing at sink Toileting: Moderate assistance Toilet Transfer: Minimal assistance Toilet Transfer Method: Squat pivot    Therapy/Group: Individual Therapy  [place] 08/02/2021, 8:26 AM
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
## 2021-08-03 NOTE — Progress Notes (Addendum)
Patient ambulated with NT to the bathroom and was feeling shaky. Patient sat in the toilet and NT called RN. Patient was pale and staring blankly. Transferred patient back in bed and laid him down and his color came back. Vitals 99, 156/68 70 HR 94% RA. HR was initially 30's to 60's. Denies and pain. Rapid response called to check on the patient. Will continue to monitor.

## 2021-08-03 NOTE — Progress Notes (Signed)
Orthopedic Tech Progress Note Patient Details:  George Bradley [birth year] [id number]   Ortho Devices Type of Ortho Device: Abdominal binder Ortho Device/Splint Location: on pt Ortho Device/Splint Interventions: Ordered, Application, Adjustment   Post Interventions Patient Tolerated: Well Instructions Provided: Care of device  [name] 08/03/2021, 4:23 PM

## 2021-08-03 NOTE — Progress Notes (Signed)
Speech Language Pathology Daily Session Note  Patient Details  Name: George Bradley MRN: [id number] Date of Birth: 1941-01-09  Today's Date: 08/03/2021 SLP Individual Time: 1400-1445 SLP Individual Time Calculation (min): 45 min  Short Term Goals: Week 1: SLP Short Term Goal 1 (Week 1): STGs=LTGs due to ELOS  Skilled Therapeutic Interventions: Pt seen for skilled ST with focus on swallowing goals, pt wife present throughout. Pt observed drinking out of straw at bedside, reports getting straws and drinking out of them despite recommendations for no straw and pt stating he prefers to drink from the cup. Pt with min A cues for use of small sips thin via straw with no s/s aspiration or change in vocal quality. Pt initially stating he thinks he is ready to trial nectar thick liquids which have been recommended during previous hospitalizations in order to reduce frequency of aspiration PNA and rehospitalization. SLP providing education about trailing 5cc Provale cup at this time to see if it will assist in preserving thin liquid tolerance. Pt tolerating Provale cup with no s/s aspiration, however after 2-3 sips reports he doesn't like using it and would prefer thin liquid via regular cup. Pt does demonstrate inconsistent awareness and insight into current deficits and deficits in decision making at this time. Pt left in room with bed alarm set and wife present for all needs. Cont ST POC with ongoing education and training for pt and wife.   Pain Pain Assessment Pain Scale: 0-10 Pain Score: 0-No pain  Therapy/Group: Individual Therapy  Dewaine Conger 08/03/2021, 3:44 PM

## 2021-08-03 NOTE — Progress Notes (Signed)
Physical Therapy Session Note  Patient Details  Name: George Bradley MRN: [id number] Date of Birth: March 10, 1941  Today's Date: 08/03/2021 PT Individual Time: 1120-1153 and 1300-1330 PT Individual Time Calculation (min): 33 min and 30 min.  Short Term Goals: Week 1:  PT Short Term Goal 1 (Week 1): STG = LTG due to ELOS  Skilled Therapeutic Interventions/Progress Updates:   First session: Pt presents supine in bed and agreeable to therapy.  Pt w/ RR early this AM and Ok'd for continued therapy.  Supine BP measured at 144/84.  Pt performed sup to sit transfers w/ CGA and scooted to EOB, w/ BP 125/76.  Pt transferred sit to stand w/ min A but states increasing symptoms and BP decreased to 76/53.  Pt returned to sitting and BP moderated to 127/80.  O2 sats remained > 95% on 1 LPM throughout session. Pt returned to supine and messaged MD re: any changes to care.  MD states to use ace wraps to BLEs as well as thigh high TEDS and abd binder.  Pt remained in bed w/ bed alarm on and all needs in reach.  Second session:  Pt presents supine in bed and agreeable to therapy.  NT in for use of urinal and handed off to this PT.  Pt transferred sup to sit w/ CGA.  BP at 120/81.  Pt unable to use urinal in sitting.  Pt transferred to standing w/ min A and able to manage clothing and brief w/ min A.  Pt states no symptoms.  Pt was continent of 150 ml of urine in urinal w/ supervision in standing position, NT to chart.  Pt stood at [place] and BP monitored at 67/55 w/ HR of 170 bpm.  Pt has not received abd binder yet, ace wraps left in room for next session.  Pt returned to supine w/ CGA.  LE there ex in supine.  Bed alarm on and all needs in reach, spouse present.     Therapy Documentation Precautions:  Precautions Precautions: Fall, Other (comment) Precaution Comments: monitor vitals Restrictions Weight Bearing Restrictions: No General: PT Amount of Missed Time (min): 27 Minutes PT Missed Treatment Reason: Other  (Comment) (positive orthostatics.) Vital Signs: Therapy Vitals Temp: 98.1 F (36.7 C) Pulse Rate: 69 Resp: 18 BP: 120/60 Patient Position (if appropriate): Lying Oxygen Therapy SpO2: 93 % O2 Device: Nasal Cannula Pain:   Mobility:   Locomotion :    Trunk/Postural Assessment :    Balance:   Exercises:   Other Treatments:      Therapy/Group: Individual Therapy  [name] 08/03/2021, 1:45 PM

## 2021-08-03 NOTE — IPOC Note (Signed)
Overall Plan of Care [facility]) Patient Details Name: George Bradley MRN: [id number] DOB: May 26, 1941  Admitting Diagnosis: SIRS (systemic inflammatory response syndrome) [facility])  Hospital Problems: Principal Problem:   SIRS (systemic inflammatory response syndrome) ([place])     Functional Problem List: Nursing Bladder, Bowel, Safety, Medication Management, Pain, Endurance  PT Balance, Behavior, Endurance, Motor, Pain  OT Balance, Cognition, Endurance, Motor, Pain, Sensory  SLP Nutrition  TR         Basic ADL's: OT Bathing, Dressing, Toileting     Advanced  ADL's: OT       Transfers: PT Bed Mobility, Bed to Chair, Car, Manufacturing systems engineer, Metallurgist: PT Ambulation, Emergency planning/management officer, Stairs     Additional Impairments: OT None  SLP Swallowing      TR      Anticipated Outcomes Item Anticipated Outcome  Self Feeding no goal, pt is set up  Swallowing  Mod A for use of swallowing compensatory strategies   Basic self-care  CGA with LB self care  Toileting  CGA   Bathroom Transfers CGA  Bowel/Bladder  manage bowel w mod I and bladder w min assist  Transfers  S*  Locomotion  S*  Communication     Cognition     Pain  pain at or below level 4 with prns  Safety/Judgment  maintain w safety cues   Therapy Plan: PT Intensity: Minimum of 1-2 x/day ,45 to 90 minutes PT Frequency: 5 out of 7 days PT Duration Estimated Length of Stay: 7-10 days OT Intensity: Minimum of 1-2 x/day, 45 to 90 minutes OT Frequency: 5 out of 7 days OT Duration/Estimated Length of Stay: 7-10 days SLP Intensity: Minumum of 1-2 x/day, 30 to 90 minutes SLP Frequency: 3 to 5 out of 7 days SLP Duration/Estimated Length of Stay: 7-10 days but may be shorter for SLP   Due to the current state of emergency, patients may not be receiving their 3-hours of Medicare-mandated therapy.   Team Interventions: Nursing Interventions Bladder Management, Disease Management/Prevention,  Medication Management, Discharge Planning, Pain Management, Bowel Management, Patient/Family Education, Dysphagia/Aspiration Precaution Training  PT interventions Ambulation/gait training, Community reintegration, DME/adaptive equipment instruction, Neuromuscular re-education, Psychosocial support, Stair training, UE/LE Strength taining/ROM, Wheelchair propulsion/positioning, UE/LE Coordination activities, Skin care/wound management, Therapeutic Activities, Pain management, Functional electrical stimulation, Discharge planning, Training and development officer, Cognitive remediation/compensation, Disease management/prevention, Functional mobility training, Patient/family education, Splinting/orthotics, Therapeutic Exercise, Visual/perceptual remediation/compensation  OT Interventions Balance/vestibular training, Cognitive remediation/compensation, Discharge planning, Pain management, Functional mobility training, DME/adaptive equipment instruction, Patient/family education, Psychosocial support, Self Care/advanced ADL retraining, UE/LE Coordination activities, UE/LE Strength taining/ROM, Therapeutic Exercise, Therapeutic Activities  SLP Interventions Dysphagia/aspiration precaution training, Cueing hierarchy, Environmental controls, Functional tasks, Patient/family education, Therapeutic Activities, Internal/external aids  TR Interventions    SW/CM Interventions Discharge Planning, Psychosocial Support, Patient/Family Education   Barriers to Discharge MD  Medical stability, Home enviroment access/loayout, Wound care, Lack of/limited family support, Weight, and New oxygen  Nursing Decreased caregiver support, Home environment access/layout, Incontinence 2 level 1ste main B+B with wife, hx of multi falls, call button and monitor in home; previous HH/OP services  PT Behavior, New oxygen, Inaccessible home environment    OT      SLP      SW       Team Discharge Planning: Destination: PT-Home ,OT- Home ,  SLP-Home Projected Follow-up: PT-Home health PT, OT-  Home health OT, SLP-24 hour supervision/assistance Projected Equipment Needs: PT-To be determined, OT- To be determined, SLP-To  be determined Equipment Details: PT- , OT-  Patient/family involved in discharge planning: PT- Patient,  OT-Patient, SLP-Patient, Family member/caregiver  MD ELOS: 7-10 days Medical Rehab Prognosis:  Good Assessment: Pt is an 80 yr old male with SIRS/sepsis on Maxipime-  Has chronic orthostasis and had episode of vasovagal episode last night- also hx of tongue CA s/p radiation/chemo and aspiration pneumonia    Goals supervision to [facility]    See Team Conference Notes for weekly updates to the plan of care
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
## 2021-08-03 NOTE — Progress Notes (Signed)
PROGRESS NOTE   Subjective/Complaints:  Pt reports had a severe shaking episode last night/o/n- per nurse, pt passed out/vasovagal episode- pt said he didn't- But heart rate was in 30s- came back to 60s- at baseline- rapid response called- he said the only Sx was severe shaking all over when walking to bathroom and pooping- LBM this AM.     ROS:  Pt denies SOB, abd pain, CP, N/V/C/D, and vision changes (+) peripheral neuropathy  Objective:   No results found. Recent Labs    08/01/21 0509 08/02/21 0541  WBC 13.6* 10.3  HGB 11.8* 11.5*  HCT 36.0* 34.9*  PLT 254 254   Recent Labs    08/01/21 0509 08/02/21 0541  NA 135 136  K 3.3* 3.9  CL 98 100  CO2 29 29  GLUCOSE 103* 101*  BUN 14 13  CREATININE 0.98 1.04  CALCIUM 8.5* 8.7*    Intake/Output Summary (Last 24 hours) at 08/03/2021 0951 Last data filed at 08/03/2021 0800 Gross per 24 hour  Intake 420 ml  Output 200 ml  Net 220 ml     Pressure Injury 07/24/21 Sacrum Mid Stage 1 -  Intact skin with non-blanchable redness of a localized area usually over a bony prominence. (Active)  07/24/21 0450  Location: Sacrum  Location Orientation: Mid  Staging: Stage 1 -  Intact skin with non-blanchable redness of a localized area usually over a bony prominence.  Wound Description (Comments):   Present on Admission: Yes    Physical Exam: Vital Signs Blood pressure (!) 156/68, pulse 70, temperature 99 F (37.2 C), temperature source Oral, resp. rate 18, SpO2 94 %.    General: awake, alert, appropriate, sitting up in bed; no shaking/tremors;  NAD HENT: conjugate gaze; oropharynx moist CV: regular rate and rhythm; no JVD Pulmonary: CTA B/L; no W/R/R- good air movement- denies SOB GI: soft, NT, ND, (+)BS- hypoactive Psychiatric: appropriate- interactive Neurological: alert Neurological:     Comments: Patient is alert.  Makes eye contact with examiner.  Follows  simple commands.  Provides name and age.  Limited medical historian. LLE 3/5, RLE 5/5 upper extremities 5/5 strength   Assessment/Plan: 1. Functional deficits which require 3+ hours per day of interdisciplinary therapy in a comprehensive inpatient rehab setting. Physiatrist is providing close team supervision and 24 hour management of active medical problems listed below. Physiatrist and rehab team continue to assess barriers to discharge/monitor patient progress toward functional and medical goals  Care Tool:  Bathing    Body parts bathed by patient: Right arm, Left arm, Chest, Abdomen, Front perineal area, Buttocks, Right upper leg, Left upper leg, Face   Body parts bathed by helper: Right lower leg, Left lower leg     Bathing assist Assist Level: Minimal Assistance - Patient > 75%     Upper Body Dressing/Undressing Upper body dressing   What is the patient wearing?: Pull over shirt    Upper body assist Assist Level: Set up assist    Lower Body Dressing/Undressing Lower body dressing      What is the patient wearing?: Incontinence brief     Lower body assist Assist for lower body dressing: Minimal Assistance - Patient >  75%     Toileting Toileting    Toileting assist Assist for toileting: Contact Guard/Touching assist     Transfers Chair/bed transfer  Transfers assist     Chair/bed transfer assist level: Contact Guard/Touching assist     Locomotion Ambulation   Ambulation assist      Assist level: Contact Guard/Touching assist Assistive device: Walker-rolling Max distance: 150   Walk 10 feet activity   Assist  Walk 10 feet activity did not occur: Safety/medical concerns (Fatigue, pain)  Assist level: Contact Guard/Touching assist Assistive device: Walker-rolling   Walk 50 feet activity   Assist Walk 50 feet with 2 turns activity did not occur: Safety/medical concerns (Fatigue, pain)  Assist level: Contact Guard/Touching assist Assistive  device: Walker-rolling    Walk 150 feet activity   Assist Walk 150 feet activity did not occur: Safety/medical concerns (Fatigue, pain)  Assist level: Contact Guard/Touching assist Assistive device: Walker-rolling    Walk 10 feet on uneven surface  activity   Assist Walk 10 feet on uneven surfaces activity did not occur: Safety/medical concerns (Fatigue, pain)         Wheelchair     Assist Is the patient using a wheelchair?: Yes Type of Wheelchair: Manual    Wheelchair assist level: Supervision/Verbal cueing Max wheelchair distance: 33'    Wheelchair 50 feet with 2 turns activity    Assist        Assist Level: Supervision/Verbal cueing   Wheelchair 150 feet activity     Assist      Assist Level: Moderate Assistance - Patient 50 - 74%   Blood pressure (!) 156/68, pulse 70, temperature 99 F (37.2 C), temperature source Oral, resp. rate 18, SpO2 94 %.    Medical Problem List and Plan: 1.  Debility secondary to SIRS/sepsis.  Completing course of Maxipime             -patient may shower             -ELOS/Goals: S 7-10 days             con't PT and OT/CIR 2.  Antithrombotics: -DVT/anticoagulation: Lovenox             -antiplatelet therapy: Aspirin 81 mg daily and Brilinta 90 mg twice daily 3. Chemotherapy induced peripheral neuropathy: Increase Neurontin to 1,000 mg 3 times daily oxycodone as needed. Cymbalta 20mg  daily prescribed. 11/12- pain slightly better this AM- con't regimen  4. Anxiety: Aricept 5 mg nightly, continue Ativan 1 mg 3 times daily             -antipsychotic agents: N/A 5. Neuropsych: This patient is capable of making decisions on his own behalf. 6. Skin/Wound Care: Routine skin checks 7. Fluids/Electrolytes/Nutrition: Routine in and outs with follow-up chemistries 8.  CHRONIC Orthostasis.  Florinef 0.1 mg daily.  Monitor with increased mobility  11/12- had episode where BP was OK, however HR dropped to 30s- thought to be  vasovagal- as per below #14 9.  History of CVA with right-sided carotid occlusion.  Continue aspirin and Brilinta 10.  Hypothyroidism.  Synthroid 11.  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  Follow-up outpatient 12.  H/o of hyperlipidemia.  LDL 20, HDL 45, VLDL 9, total cholesterol 91, TG 47. No current evidence of hyperlipidemia so will d/c Crestor 13. Recurrent aspiration pneumonias: patient prefers to consume oral food and defers PEG, discussed with patient and SLP  14. Vasovagal episode  11/12- occurred overnight- HR to 30s- rapid response called- question if passed out- pt said no; staff said yes- per note, was staring blankly- will con't to monitor- has chronic orthstasis, so could have played a role-    LOS: 3 days A FACE TO FACE EVALUATION WAS PERFORMED  George Bradley 08/03/2021, 9:51 AM

## 2021-08-05 DIAGNOSIS — R5381 Other malaise: Principal | ICD-10-CM

## 2021-08-05 MED ORDER — MELATONIN 3 MG PO TABS
3.0000 mg | ORAL_TABLET | Freq: Once | ORAL | Status: AC
  Administered 2021-08-05: 3 mg via ORAL
  Filled 2021-08-05: qty 1

## 2021-08-05 NOTE — Progress Notes (Signed)
Occupational Therapy Session Note  Patient Details  Name: George Bradley MRN: [id number] Date of Birth: [birth year]  Today's Date: 08/05/2021 OT Individual Time: 0272-5366 OT Individual Time Calculation (min): 30 min  and Today's Date: 08/05/2021 OT Missed Time: 30 Minutes Missed Time Reason: Patient ill (comment)   Short Term Goals: Week 1:  OT Short Term Goal 1 (Week 1): STGs = LTGs  Skilled Therapeutic Interventions/Progress Updates:    Pt received in bed with wife and RN present.  He had just gotten back to bed due to feeling dizzy.  RN assessed BP and it was slightly low.  Pt felt his head was spinning.  His wife has stated that he has had dizziness for years and they have struggled finding a cause for it.  Pt kept saying he did not feel well, suggested he rest for 30 min then I return and we try again.   Returned in 30 min and asked pt to sit up to EOB while visually focusing on a target.  He did with S and said his head felt like it was "leaning over" and he felt really dizzy but the room was not spinning.  Asked pt to try to endure through it while working on deep breathing.  Sitting BP 104/64.  After 5 min of sitting, pt stated he had to lay down.    He just kept saying he felt awful. He feels he is not able to progress here and is ready to go home and get care with Hospice.  Pt's wife agreed. They feel he is not able to benefit from rehab and know he needs more care at home. They would like to go home on Wednesday.  I let them know I would inform the team of their desires.  This is a reasonable request as he is not able to do that much therapy due to LE pain and the dizziness.  Pt did agree to working on bed level exercises of pelvic tilts, knee sways, bridges and single leg kicks.  Pt resting in bed with all needs met.  Bed alarm set.    Therapy Documentation Precautions:  Precautions Precautions: Fall, Other (comment) Precaution Comments: monitor  vitals Restrictions Weight Bearing Restrictions: No    Vital Signs: Oxygen Therapy SpO2: 93 % O2 Device: Room Air Pain: Pain Assessment Pain Scale: 0-10 Pain Score: 0-No pain    Therapy/Group: Individual Therapy  [place] 08/05/2021, 10:10 AM

## 2021-08-05 NOTE — Progress Notes (Signed)
PROGRESS NOTE   Subjective/Complaints: Discussed orthostatic events , mainly when getting up to go to BR No new issues overnite   ROS:  Pt denies SOB, abd pain, CP, N/V/C/D (+) peripheral neuropathy  Objective:   No results found. No results for input(s): WBC, HGB, HCT, PLT in the last 72 hours.  No results for input(s): NA, K, CL, CO2, GLUCOSE, BUN, CREATININE, CALCIUM in the last 72 hours.   Intake/Output Summary (Last 24 hours) at 08/05/2021 0728 Last data filed at 08/04/2021 0854 Gross per 24 hour  Intake 177 ml  Output 125 ml  Net 52 ml      Pressure Injury 07/24/21 Sacrum Mid Stage 1 -  Intact skin with non-blanchable redness of a localized area usually over a bony prominence. (Active)  07/24/21 0450  Location: Sacrum  Location Orientation: Mid  Staging: Stage 1 -  Intact skin with non-blanchable redness of a localized area usually over a bony prominence.  Wound Description (Comments):   Present on Admission: Yes    Physical Exam: Vital Signs Blood pressure (!) 154/67, pulse 67, temperature 98.7 F (37.1 C), resp. rate 14, SpO2 93 %.  General: No acute distress Mood and affect are appropriate Heart: Regular rate and rhythm no rubs murmurs or extra sounds Lungs: Clear to auscultation, breathing unlabored, no rales or wheezes Abdomen: Positive bowel sounds, soft nontender to palpation, nondistended Extremities: No clubbing, cyanosis, or edema Skin: No evidence of breakdown, no evidence of rash   Neurological: alert Neurological:     Comments: Patient is alert.  Makes eye contact with examiner.  Follows simple commands.  Provides name and age.  Limited medical historian. LLE 4/5, RLE 5/5 upper extremities 5/5 strength   Assessment/Plan: 1. Functional deficits which require 3+ hours per day of interdisciplinary therapy in a comprehensive inpatient rehab setting. Physiatrist is providing close team  supervision and 24 hour management of active medical problems listed below. Physiatrist and rehab team continue to assess barriers to discharge/monitor patient progress toward functional and medical goals  Care Tool:  Bathing    Body parts bathed by patient: Right arm, Left arm, Chest, Abdomen, Front perineal area, Buttocks, Right upper leg, Left upper leg, Face   Body parts bathed by helper: Right lower leg, Left lower leg     Bathing assist Assist Level: Minimal Assistance - Patient > 75%     Upper Body Dressing/Undressing Upper body dressing   What is the patient wearing?: Pull over shirt    Upper body assist Assist Level: Supervision/Verbal cueing    Lower Body Dressing/Undressing Lower body dressing      What is the patient wearing?: Pants     Lower body assist Assist for lower body dressing: Contact Guard/Touching assist     Toileting Toileting    Toileting assist Assist for toileting: Contact Guard/Touching assist     Transfers Chair/bed transfer  Transfers assist     Chair/bed transfer assist level: Contact Guard/Touching assist     Locomotion Ambulation   Ambulation assist      Assist level: Contact Guard/Touching assist Assistive device: Walker-rolling Max distance: 150   Walk 10 feet activity   Assist  Walk  10 feet activity did not occur: Safety/medical concerns (Fatigue, pain)  Assist level: Contact Guard/Touching assist Assistive device: Walker-rolling   Walk 50 feet activity   Assist Walk 50 feet with 2 turns activity did not occur: Safety/medical concerns (Fatigue, pain)  Assist level: Contact Guard/Touching assist Assistive device: Walker-rolling    Walk 150 feet activity   Assist Walk 150 feet activity did not occur: Safety/medical concerns (Fatigue, pain)  Assist level: Contact Guard/Touching assist Assistive device: Walker-rolling    Walk 10 feet on uneven surface  activity   Assist Walk 10 feet on uneven  surfaces activity did not occur: Safety/medical concerns (Fatigue, pain)         Wheelchair     Assist Is the patient using a wheelchair?: Yes Type of Wheelchair: Manual    Wheelchair assist level: Supervision/Verbal cueing Max wheelchair distance: 71'    Wheelchair 50 feet with 2 turns activity    Assist        Assist Level: Supervision/Verbal cueing   Wheelchair 150 feet activity     Assist      Assist Level: Moderate Assistance - Patient 50 - 74%   Blood pressure (!) 154/67, pulse 67, temperature 98.7 F (37.1 C), resp. rate 14, SpO2 93 %.    Medical Problem List and Plan: 1.  Debility secondary to SIRS/sepsis.  Completing course of Maxipime             -patient may shower             -ELOS/Goals: S 7-10 days             con't PT and OT/CIR 2.  Antithrombotics: -DVT/anticoagulation: Lovenox             -antiplatelet therapy: Aspirin 81 mg daily and Brilinta 90 mg twice daily 3. Chemotherapy induced peripheral neuropathy: Increase Neurontin to 1,000 mg 3 times daily oxycodone as needed. Cymbalta 20mg  daily prescribed. 11/12- pain slightly better this AM- con't regimen  4. Anxiety: Aricept 5 mg nightly, continue Ativan 1 mg 3 times daily             -antipsychotic agents: N/A 5. Neuropsych: This patient is capable of making decisions on his own behalf. 6. Skin/Wound Care: Routine skin checks 7. Fluids/Electrolytes/Nutrition: Routine in and outs with follow-up chemistries 8.  CHRONIC Orthostasis.  Florinef 0.1 mg daily.  Monitor with increased mobility  11/12- had episode where BP was OK, however HR dropped to 30s- thought to be vasovagal- as per below #14- cont Abd binder and thigh high TEDs  9.  History of CVA with right-sided carotid occlusion.  Continue aspirin and Brilinta 10.  Hypothyroidism.  Synthroid 11.  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  Follow-up outpatient 12.  H/o of hyperlipidemia.  LDL 20, HDL 45, VLDL 9, total cholesterol 91, TG 47. No current evidence of hyperlipidemia so will d/c Crestor 13. Recurrent aspiration pneumonias: patient prefers to consume oral food and defers PEG, discussed with patient and SLP Hx radiation induced esophageal stricture, hx tongue Ca, may have pharyngeal dysphagia as a result as well  14. Vasovagal episode  11/12- occurred overnight- HR to 30s- rapid response called- question if passed out- pt said no; staff said yes- per note, was staring blankly- will con't to monitor- has chronic orthstasis, so could have played a role-    LOS: 5 days A FACE TO FACE EVALUATION WAS  PERFORMED  Charlett Blake 08/05/2021, 7:28 AM

## 2021-08-05 NOTE — Progress Notes (Signed)
Physical Therapy Session Note  Patient Details  Name: George Bradley MRN: [id number] Date of Birth: 02-Nov-1940  Today's Date: 08/05/2021 PT Individual Time: 0800-0910; 3300-7622 PT Individual Time Calculation (min): 70 min and 29 min  Short Term Goals: Week 1:  PT Short Term Goal 1 (Week 1): STG = LTG due to ELOS  Skilled Therapeutic Interventions/Progress Updates:  Session 1 Pt received seated EOB eating breakfast, no O2 donned, denied pain and was agreeable to PT. Wife present for session. Emphasis of session on improved activity tolerance, cardiovascular endurance and anterior weight shifting. Donned ted hose, shoes and abdominal binder w/max A at EOB. BP seated: 118/67 mmHg, SpO2 91% with no O2. Sit <>stand w/min A for posterior lean correction and mod verbal cues for hand placement and anterior weight shift, pt able to teach back. BP in standing: 102/68 mmHg, pt symptomatic. Stand <>sit w/min A due to fatigue and improper hand placement, min verbal cues for diaphragmatic breathing. Pt then performed 7 sit <>stands from EOB w/CGA and RW for dynamic cardiovascular warmup. After short rest break, sit <>stand from EOB and pt performed alt standing marches w/BUE support on RW, x20 per side w/CGA for elevated HR and priming for ambulation. BP after marches: 104/71 mmHg, SpO2 88% without O2. Donned 1L O2 and SpO2 at 94% after 2 minutes.   Gait training -Pt performed sit <>stands from [facility] throughout session w/S*, no verbal cues required.  -Pt ambulated 100', 45', and 120' w/CGA and RW. Noted kyphotic posture, narrow BOS, decreased step clearance/length and occasional scissoring of gait. Min multimodal cues provide for forward gaze, trunk extension and glute activation. Pt required several minute rest break throughout, SpO2 at 90-91% w/1L O2. Pt verbalized being tense due to anxiety w/gait, unable to verbalize trigger of anxiety. Provided encouragement for pt's progress and current performance. Noted  pt's nose began bleeding, notified nursing and requested humidifier for O2.  Pt transported back to room w/total A due to fatigue and performed sit <>stand pivot from WC to recliner w/CGA and no AD. Doffed O2 and provided warm washcloth for bloody nose. Pt was left seated in recliner in room, wife present, all needs in reach.   Session 2 Pt received supine in bed, slumped over to R side and reported 10/10 pain in L hip. Pt had received pain meds prior to session, nursing notified to inquire about any other meds pt can have. Emphasis of session on bed mobility and PNE. Provided ice pack for hip and educated pt on proper positioning in bed, which he was able to complete w/min A. Pt refused all exercise due to pain, lengthy conversation regarding the impact of sleep deprivation on pain modulation and explained the fire alarm analogy. Pt verbalized understanding but doubted his ability to sleep, requested pt to grab a "baseball bat and knock me out". Nursing present to administer Ativan and Voltaren to L hip for pain relief and reduced anxiety to promote relaxation. Pt was left supine in bed, ice pack on L hip, all needs in reach.   Therapy Documentation Precautions:  Precautions Precautions: Fall, Other (comment) Precaution Comments: monitor vitals Restrictions Weight Bearing Restrictions: No   Therapy/Group: Individual Therapy Cruzita Lederer Kamilo Och, PT, DPT  08/05/2021, 7:41 AM

## 2021-08-05 NOTE — Progress Notes (Signed)
Speech Language Pathology Daily Session Note  Patient Details  Name: George Bradley MRN: [id number] Date of Birth: [birth year]  Today's Date: 08/05/2021 SLP Individual Time: 1130-1200 SLP Individual Time Calculation (min): 30 min  Short Term Goals: Week 1: SLP Short Term Goal 1 (Week 1): STGs=LTGs due to ELOS  Skilled Therapeutic Interventions: Pt seen for skilled ST with focus on swallowing goals, daughter present throughout. Pt reportedly tried Provale cup over the weekend but "it was very hard to get used to so I sent it home". Pt does state he is utilizing swallow strategies as trained, primarily small bites, small sips and slow rate. Pt observed with 2 oz thin liquid via cup with no s/s aspiration demonstrating successful use of small sips. Pt states he eats cereal for breakfast at home, SLP educated pt and family on mixed consistencies and challenges they can present. Pt agreeable to trial cereal this date, mild diffuse oral stasis noted with all bites and delayed throat clear/weak cough on 25% occasion. Pt did increase frequency of liquid wash as trials progressed, states he feels as though some food was stuck in throat. Pt states throat clear/coughing during trials was "to clean my pipes" and was dismissive with education regarding s/s aspiration. He states "I wasn't trying my best to swallow that's why". Pt left in bed with alarm set and all needs within reach. Cont ST POC.   Pain Pain Assessment Pain Scale: 0-10 Pain Score: 0-No pain  Therapy/Group: Individual Therapy  Dewaine Conger 08/05/2021, 12:09 PM

## 2021-08-06 MED ORDER — MELATONIN 3 MG PO TABS
3.0000 mg | ORAL_TABLET | Freq: Every day | ORAL | Status: DC
  Administered 2021-08-06 – 2021-08-08 (×3): 3 mg via ORAL
  Filled 2021-08-06 (×3): qty 1

## 2021-08-06 MED ORDER — DICLOFENAC EPOLAMINE 1.3 % EX PTCH
1.0000 | MEDICATED_PATCH | Freq: Two times a day (BID) | CUTANEOUS | Status: DC
  Administered 2021-08-06 – 2021-08-09 (×8): 1 via TRANSDERMAL
  Filled 2021-08-06 (×7): qty 1

## 2021-08-06 MED ORDER — BETAMETHASONE SOD PHOS & ACET 6 (3-3) MG/ML IJ SUSP
12.0000 mg | Freq: Once | INTRAMUSCULAR | Status: AC
  Administered 2021-08-06: 12 mg via INTRAMUSCULAR
  Filled 2021-08-06: qty 2

## 2021-08-06 MED ORDER — LORAZEPAM 0.5 MG PO TABS
0.5000 mg | ORAL_TABLET | Freq: Three times a day (TID) | ORAL | Status: DC
  Administered 2021-08-06 – 2021-08-09 (×9): 0.5 mg via ORAL
  Filled 2021-08-06 (×10): qty 1

## 2021-08-06 NOTE — Progress Notes (Deleted)
Pt seen today for OT services, pt completed LB bathing at EOB incorporating AE, the long handle sponge for his BLE's and feet with Minimal assistance and minimal vc's .  The pt required assistance bathing bilateral LE secondary to challenge reaching and coordination.  The pt was MinA for donning pant over his feet, he was CGA-MinA for  coming from sit to stand incorporating his RW for standing to donn his pants over his bottom. The pt's physician came in to administer an injection for pain and the pt was returned to bed with MinA for going from EOB to supine, to side lying.  The pt returned to EOB with CGA, however, upon monitoring changes in his BP he was returned to bed per nursing request.  The pt presented with O2 saturations of 94%, I inquired as to whether he was experiencing any dizziness, he answered in the affirmative, his BP was taken and he had a BP of 84/53 upon returning to supine The pt had a BP of 84/53, at EOB and upon returning to supine in bed his BP was  151/75. The pt was able to transition from supine to pulling himself over with MinA and 2 vc's for posiitoning.  The pt was instructed in UB theraex using a towel for AROM to improve his overall ability to access his environment and to improve his activity tolerance. Upon exiting the room, the pt was resting in bed with the bedside table placed within reach, water available and the call light and his mobile device available. Marland Kitchen

## 2021-08-06 NOTE — Progress Notes (Signed)
Physical Therapy Discharge Summary  Patient Details  Name: George Bradley MRN: [id number] Date of Birth: 1941-01-23   Patient has met 6 of 9 long term goals due to improved activity tolerance, improved balance, improved postural control, increased strength, and decreased pain.  Patient to discharge at an ambulatory level Supervision.   Patient's care partner is independent to provide the necessary physical and cognitive assistance at discharge.   Reasons goals not met: Pt requires min A for stairs due to his posterior lean and orthostasis. He can self-propel up to 75' in [facility] prior to fatiguing. Pt also requires CGA for car transfers due to poor hand placement and forgetting safe sequence to enter/exit car.   Recommendation:  Patient will benefit from ongoing skilled PT services in home health setting to continue to advance safe functional mobility, address ongoing impairments in balance, global deconditioning and minimize fall risk.  Equipment: No equipment provided  Reasons for discharge: treatment goals met and discharge from hospital  Patient/family agrees with progress made and goals achieved: Yes  PT Discharge Precautions/Restrictions Precautions Precautions: Fall;Other (comment) Precaution Comments: monitor vitals Restrictions Weight Bearing Restrictions: No Pain Interference Pain Interference Pain Effect on Sleep: 1. Rarely or not at all Pain Interference with Therapy Activities: 1. Rarely or not at all Pain Interference with Day-to-Day Activities: 1. Rarely or not at all Vision/Perception  Vision - History Ability to See in Adequate Light: 0 Adequate Perception Perception: Within Functional Limits Praxis Praxis: Intact  Cognition Overall Cognitive Status: History of cognitive impairments - at baseline Arousal/Alertness: Awake/alert Orientation Level: Oriented X4 Year: 2022 Month: November Day of Week: Correct Immediate Memory Recall: Sock;Blue;Bed Memory Recall  Sock: Without Cue Memory Recall Blue: With Cue Memory Recall Bed: Without Cue Awareness: Impaired Safety/Judgment: Impaired Comments: Dementia Sensation Sensation Light Touch: Appears Intact Coordination Gross Motor Movements are Fluid and Coordinated: No Coordination and Movement Description: Posterior lean, affected by orthostais Motor  Motor Motor: Hemiplegia;Abnormal postural alignment and control Motor - Discharge Observations: posterior lean, limited hip flexion tolerance due to pain. L sided hemiplegia  Mobility Bed Mobility Bed Mobility: Supine to Sit;Sit to Supine Sitting - Scoot to Edge of Bed: Independent with assistive device Sit to Supine: Independent with assistive device Transfers Transfers: Sit to Stand;Stand to Sit;Stand Pivot Transfers Sit to Stand: Supervision/Verbal cueing Stand to Sit: Supervision/Verbal cueing Stand Pivot Transfers: Supervision/Verbal cueing Stand Pivot Transfer Details: Verbal cues for precautions/safety Transfer (Assistive device): Rolling walker Locomotion  Gait Ambulation: Yes Gait Assistance: Supervision/Verbal cueing Gait Distance (Feet): 100 Feet Assistive device: Rolling walker Gait Assistance Details: Tactile cues for posture Gait Gait: Yes Gait Pattern: Impaired Gait Pattern: Step-to pattern;Decreased step length - right;Decreased step length - left;Decreased dorsiflexion - right;Decreased dorsiflexion - left;Left flexed knee in stance;Right flexed knee in stance;Trunk flexed;Poor foot clearance - left;Poor foot clearance - right Gait velocity: decreased Stairs / Additional Locomotion Stairs: Yes Stairs Assistance: Contact Guard/Touching assist Stair Management Technique: Two rails Number of Stairs: 4 Height of Stairs: 6 Ramp: Supervision/Verbal cueing Wheelchair Mobility Wheelchair Mobility: Yes Wheelchair Assistance: Chartered loss adjuster: Both upper extremities Wheelchair Parts Management:  Needs assistance Distance: 15'  Trunk/Postural Assessment  Cervical Assessment Cervical Assessment: Exceptions to [facility] (forward head) Thoracic Assessment Thoracic Assessment: Exceptions to [facility] (rounded shoulders) Lumbar Assessment Lumbar Assessment: Exceptions to [facility] (posterior pelvic tilt) Postural Control Postural Control: Within Functional Limits  Balance Balance Balance Assessed: Yes Static Sitting Balance Static Sitting - Balance Support: Feet supported;No upper extremity supported Static Sitting - Level  of Assistance: 7: Independent Dynamic Sitting Balance Dynamic Sitting - Balance Support: Feet supported;During functional activity Dynamic Sitting - Level of Assistance: 5: Stand by assistance Dynamic Sitting - Balance Activities: Reaching for objects;Reaching across midline;Lateral lean/weight shifting;Forward lean/weight shifting Sitting balance - Comments: CGA for safety d/t BP issues Static Standing Balance Static Standing - Balance Support: During functional activity;Bilateral upper extremity supported Static Standing - Level of Assistance: 5: Stand by assistance Dynamic Standing Balance Dynamic Standing - Balance Support: During functional activity;Bilateral upper extremity supported Dynamic Standing - Level of Assistance: 5: Stand by assistance Dynamic Standing - Balance Activities: Reaching across midline;Reaching for objects;Lateral lean/weight shifting Dynamic Standing - Comments: CGA for safety during ADLs Extremity Assessment  RLE Assessment RLE Assessment: Exceptions to [facility] RLE Strength RLE Overall Strength: Within Functional Limits for tasks assessed Right Hip Flexion: 4+/5 Right Hip Extension: 4+/5 Right Hip ABduction: 4+/5 Right Hip ADduction: 4+/5 Right Knee Flexion: 4+/5 Right Knee Extension: 4+/5 Right Ankle Dorsiflexion: 4+/5 Right Ankle Plantar Flexion: 4+/5 LLE Assessment LLE Assessment: Exceptions to [facility] LLE Strength LLE Overall Strength:  Deficits;Due to pain;Due to premorbid status Left Hip Flexion: 4-/5 Left Hip Extension: 4-/5 Left Hip ABduction: 4/5 Left Hip ADduction: 4-/5 Left Knee Flexion: 4/5 Left Knee Extension: 4/5 Left Ankle Dorsiflexion: 4/5 Left Ankle Plantar Flexion: 4/5   Josephmichael Lisenbee E Helene Bernstein, PT, DPT 08/08/2021, 9:43 AM

## 2021-08-06 NOTE — Progress Notes (Signed)
Patient ID: George Bradley, male   DOB: 1941/07/15, 80 y.o.   MRN: [id number]  Met with pt and spoke with wife via telephone to discuss team conference goals of supervision level and target discharge date of 11/18. Pt is upset with this date due to wanting to go home tomorrow, but in such pain can not participate in therapies  this morning. Pt states: " The pain is gone, the doctor said in four hours it would be gone and it is."  Wife left pt reports because she is too negative. She is concerned about getting him up the three steps into their home. Pt is very argumentative and wants what he wants even if not safe for him. Discussed with him to talk with the MD about this. Wife does want [facility] to follow will call them and make referral. Work toward discharge, told wife to stay at home and take a break, she plans to.

## 2021-08-06 NOTE — Progress Notes (Signed)
Speech Language Pathology Daily Session Note  Patient Details  Name: ELIESER TETRICK MRN: [id number] Date of Birth: 26-Aug-1941  Today's Date: 08/06/2021 SLP Individual Time: 1031-1100 SLP Individual Time Calculation (min): 29 min  Short Term Goals: Week 1: SLP Short Term Goal 1 (Week 1): STGs=LTGs due to ELOS  Skilled Therapeutic Interventions: Pt seen for skilled ST with focus on swallow goals, wife present throughout and states they are planning to leave tomorrow with Hospice care to follow. Pt is insistent on leaving tomorrow so focus of therapy on discharge planning on recommended diet of Dys 3 and NTL per pt/family request. Wife states she has purchased the large pump bottle of SimplyThick, discussed proper thickening strategies and measurements. Pt and wife provided diet hand out for Dys 3, reviewed recommended foods and foods to avoid, wife asking appropriate questions and answered to satisfaction. With min A cues, pt able to recall 4 out of 4 compensatory strategies. Pt states he will be compliant with NTL, however patient educated on recommended use of Provale cup if he does choose to consume thin liquids at home. Wife planning to order 10cc Provale cup due to pt disliking 5cc. Will continue to treat and educate patient during CIR on swallow function and compensatory strategies. Pt left in bed with wife present for all needs. Cont ST POC.    Pain Pain Assessment Pain Scale: 0-10 Pain Score: 8  Pain Type: Acute pain Pain Location: Hip  Therapy/Group: Individual Therapy  Dewaine Conger 08/06/2021, 12:02 PM

## 2021-08-06 NOTE — Progress Notes (Signed)
Occupational Therapy Session Note  Patient Details  Name: George Bradley MRN: [id number] Date of Birth: [birth year]  Today's Date: 08/06/2021 OT Individual Time: 0830-0900 OT Individual Time Calculation (min): 30 min    Short Term Goals: Week 1:  OT Short Term Goal 1 (Week 1): STGs = LTGs  Skilled Therapeutic Interventions/Progress Updates:    Pt received in bed with wife present. He stated his L hip hurts worse than ever and now he cant even pick up his leg.  Pt WANTS to leave tomorrow and is very insistent on that. His wife is wanting him to stay as he is in so much pain, she said she does not know how she will get him up the steps.   Pt is upset about staying but physically he can barely participate.   Pt did sit to EOB with mod A to help move his leg.  From EOB worked on bathing UB, thighs.   At this point the next OT arrived to continue the bathing and dressing process.     Therapy Documentation Precautions:  Precautions Precautions: Fall, Other (comment) Precaution Comments: monitor vitals Restrictions Weight Bearing Restrictions: No   Vital Signs: Therapy Vitals Temp: 98.7 F (37.1 C) Temp Source: Oral Pulse Rate: 65 Resp: 16 BP: (!) 158/91 Patient Position (if appropriate): Lying Oxygen Therapy SpO2: 93 % O2 Device: Room Air Pain: Pain Assessment Pain Scale: 0-10 Pain Score: 10-Worst pain ever Pain Type: Acute pain Pain Location: Hip Pain Orientation: Left Pain Descriptors / Indicators: Aching;Discomfort Pain Frequency: Constant Pain Onset: On-going Pain Intervention(s): Medication (See eMAR) ADL: ADL Eating: Set up Grooming: Setup Where Assessed-Grooming: Sitting at sink Upper Body Bathing: Setup Where Assessed-Upper Body Bathing: Sitting at sink Lower Body Bathing: Minimal assistance Where Assessed-Lower Body Bathing: Sitting at sink, Standing at sink Upper Body Dressing: Setup Where Assessed-Upper Body Dressing: Sitting at sink Lower Body  Dressing: Maximal assistance Where Assessed-Lower Body Dressing: Sitting at sink, Standing at sink Toileting: Moderate assistance Toilet Transfer: Minimal assistance Toilet Transfer Method: Squat pivot   Therapy/Group: Individual Therapy  [place] 08/06/2021, 8:25 AM

## 2021-08-06 NOTE — Progress Notes (Signed)
Occupational Therapy Session Note  Patient Details  Name: George Bradley MRN: [id number] Date of Birth: [birth year]  Today's Date: 08/06/2021 OT Individual Time: 0900-1000 OT Individual Time Calculation (min): 60 min    Short Term Goals: Week 1:  OT Short Term Goal 1 (Week 1): STGs = LTGs Week 2:    Week 3:     Skilled Therapeutic Interventions/Progress Updates:     Pt seen today for OT services, pt completed LB bathing at EOB incorporating AE, the long handle sponge for his BLE's and feet with Minimal assistance and minimal vc's .  The pt required assistance bathing bilateral LE secondary to challenge reaching and coordination.  The pt was MinA for donning pant over his feet, he was CGA-MinA for  coming from sit to stand incorporating his RW for standing to donn his pants over his bottom. The pt's physician came in to administer an injection for pain and the pt was returned to bed with MinA for going from EOB to supine, to side lying.  The pt returned to EOB with CGA, however, upon monitoring changes in his BP he was returned to bed per nursing request.  The pt presented with O2 saturations of 94%, I inquired as to whether he was experiencing any dizziness, he answered in the affirmative, his BP was taken and he had a BP of 84/53 upon returning to supine The pt had a BP of 84/53, at EOB and upon returning to supine in bed his BP was  151/75. The pt was able to transition from supine to pulling himself over with MinA and 2 vc's for posiitoning.  The pt was instructed in UB theraex using a towel for AROM to improve his overall ability to access his environment and to improve his activity tolerance. Upon exiting the room, the pt was resting in bed with the bedside table placed within reach, water available and the call light and his mobile device available. .  Therapy Documentation Precautions:  Precautions Precautions: Fall, Other (comment) Precaution Comments: monitor vitals Restrictions Weight  Bearing Restrictions: No General:   Vital Signs: Therapy Vitals Temp: 97.9 F (36.6 C) Pulse Rate: 64 Resp: 20 BP: 125/64 Patient Position (if appropriate): Lying Oxygen Therapy SpO2: 90 % O2 Device: Room Air Pain: Pain Assessment Pain Scale: 0-10 Pain Score: 8  Pain Type: Acute pain Pain Location: Hip ADL: Perception    Praxis   Exercises:   Other Treatments:     Therapy/Group: Individual Therapy  [name] 08/06/2021, 1:49 PM

## 2021-08-06 NOTE — Progress Notes (Addendum)
Physical Therapy Session Note  Patient Details  Name: George Bradley MRN: [id number] Date of Birth: 06-May-1941  Today's Date: 08/06/2021 PT Individual Time: 1102-1155 PT Individual Time Calculation (min): 53 min   Short Term Goals: Week 1:  PT Short Term Goal 1 (Week 1): STG = LTG due to ELOS  Skilled Therapeutic Interventions/Progress Updates:  Pt received supine in bed, wife present. Pt reported 8/10 pain in L hip and refused to move/participate even at bed level. Pt's wife concerned about potential to DC tomorrow and verbalized to therapist that she would prefer he stay until he can ascend steps. Attempted to inform pt of potential DC date of Friday (11/19), but pt became agitated and very defensive, stating he would leave AMA and "punch and scream in the hallway". Nursing present and attempted to educate pt on the risks of leaving too early due to his high fall risk status, but pt reported he would get "a stranger on the street to carry him into his house". Remainder of session spent discussing DC options w/wife and pt. Pt frequently dosed off during session but became agitated that wife and therapist were speaking without him being able to hear. Wife verbalized concerns regarding medications pt is on, informed her I would speak to the MD during conference. Pt finally agreeable to stay until Thursday if absolutely necessary, team made aware. Pt was left supine in bed, wife present, all needs in reach.   Therapy Documentation Precautions:  Precautions Precautions: Fall, Other (comment) Precaution Comments: monitor vitals Restrictions Weight Bearing Restrictions: No   Therapy/Group: Individual Therapy Cruzita Lederer Imogean Ciampa, PT, DPT  08/06/2021, 7:44 AM

## 2021-08-06 NOTE — Progress Notes (Addendum)
PROGRESS NOTE   Subjective/Complaints: Left hip pain co, no recent fall or trauma , no prior surgery  ROS:  Pt denies SOB, abd pain, CP, N/V/C/D (+) peripheral neuropathy  Objective:   No results found. No results for input(s): WBC, HGB, HCT, PLT in the last 72 hours.  No results for input(s): NA, K, CL, CO2, GLUCOSE, BUN, CREATININE, CALCIUM in the last 72 hours.   Intake/Output Summary (Last 24 hours) at 08/06/2021 0713 Last data filed at 08/06/2021 0500 Gross per 24 hour  Intake 120 ml  Output 875 ml  Net -755 ml      Pressure Injury 07/24/21 Sacrum Mid Stage 1 -  Intact skin with non-blanchable redness of a localized area usually over a bony prominence. (Active)  07/24/21 0450  Location: Sacrum  Location Orientation: Mid  Staging: Stage 1 -  Intact skin with non-blanchable redness of a localized area usually over a bony prominence.  Wound Description (Comments):   Present on Admission: Yes    Physical Exam: Vital Signs Blood pressure (!) 158/91, pulse 65, temperature 98.7 F (37.1 C), temperature source Oral, resp. rate 16, SpO2 93 %.  General: No acute distress Mood and affect are appropriate Heart: Regular rate and rhythm no rubs murmurs or extra sounds Lungs: Clear to auscultation, breathing unlabored, no rales or wheezes Abdomen: Positive bowel sounds, soft nontender to palpation, nondistended Extremities: No clubbing, cyanosis, or edema Skin: No evidence of breakdown, no evidence of rash  MSK- no ecchymosis or tenderness over L lateral hip,no pain with active hip flexion , add, abduction  Neurological: alert Neurological:     Comments: Patient is alert.  Makes eye contact with examiner.  Follows simple commands.  Provides name and age. Does not remember me as MD who performed injection yesterday LLE 4/5, RLE 5/5 upper extremities 5/5 strength   Assessment/Plan: 1. Functional deficits which  require 3+ hours per day of interdisciplinary therapy in a comprehensive inpatient rehab setting. Physiatrist is providing close team supervision and 24 hour management of active medical problems listed below. Physiatrist and rehab team continue to assess barriers to discharge/monitor patient progress toward functional and medical goals  Care Tool:  Bathing    Body parts bathed by patient: Right arm, Left arm, Chest, Abdomen, Front perineal area, Buttocks, Right upper leg, Left upper leg, Face   Body parts bathed by helper: Right lower leg, Left lower leg     Bathing assist Assist Level: Minimal Assistance - Patient > 75%     Upper Body Dressing/Undressing Upper body dressing   What is the patient wearing?: Pull over shirt    Upper body assist Assist Level: Supervision/Verbal cueing    Lower Body Dressing/Undressing Lower body dressing      What is the patient wearing?: Pants     Lower body assist Assist for lower body dressing: Contact Guard/Touching assist     Toileting Toileting    Toileting assist Assist for toileting: Contact Guard/Touching assist     Transfers Chair/bed transfer  Transfers assist     Chair/bed transfer assist level: Contact Guard/Touching assist     Locomotion Ambulation   Ambulation assist  Assist level: Contact Guard/Touching assist Assistive device: Walker-rolling Max distance: 150   Walk 10 feet activity   Assist  Walk 10 feet activity did not occur: Safety/medical concerns (Fatigue, pain)  Assist level: Contact Guard/Touching assist Assistive device: Walker-rolling   Walk 50 feet activity   Assist Walk 50 feet with 2 turns activity did not occur: Safety/medical concerns (Fatigue, pain)  Assist level: Contact Guard/Touching assist Assistive device: Walker-rolling    Walk 150 feet activity   Assist Walk 150 feet activity did not occur: Safety/medical concerns (Fatigue, pain)  Assist level: Contact  Guard/Touching assist Assistive device: Walker-rolling    Walk 10 feet on uneven surface  activity   Assist Walk 10 feet on uneven surfaces activity did not occur: Safety/medical concerns (Fatigue, pain)         Wheelchair     Assist Is the patient using a wheelchair?: Yes Type of Wheelchair: Manual    Wheelchair assist level: Supervision/Verbal cueing Max wheelchair distance: 25'    Wheelchair 50 feet with 2 turns activity    Assist        Assist Level: Supervision/Verbal cueing   Wheelchair 150 feet activity     Assist      Assist Level: Moderate Assistance - Patient 50 - 74%   Blood pressure (!) 158/91, pulse 65, temperature 98.7 F (37.1 C), temperature source Oral, resp. rate 16, SpO2 93 %.    Medical Problem List and Plan: 1.  Debility secondary to SIRS/sepsis.  Completing course of Maxipime             -patient may shower             -ELOS/Goals: S 7-10 days             con't PT and OT/CIR Team conference today please see physician documentation under team conference tab, met with team  to discuss problems,progress, and goals. Formulized individual treatment plan based on medical history, underlying problem and comorbidities.  2.  Antithrombotics: -DVT/anticoagulation: Lovenox             -antiplatelet therapy: Aspirin 81 mg daily and Brilinta 90 mg twice daily 3. Chemotherapy induced peripheral neuropathy: Increase Neurontin to 1,000 mg 3 times daily oxycodone as needed. Cymbalta 33m daily prescribed. Hip pain on Left likely trochanteric bursitis, xray neg , corticosteroid injection may be of benefit , , also order flector patch  4. Anxiety: Aricept 5 mg nightly, continue Ativan 1 mg 3 times daily             -antipsychotic agents: N/A 5. Neuropsych: This patient is capable of making decisions on his own behalf. 6. Skin/Wound Care: Routine skin checks 7. Fluids/Electrolytes/Nutrition: Routine in and outs with follow-up chemistries 8.   CHRONIC Orthostasis.  Florinef 0.1 mg daily.  Monitor with increased mobility  11/12- had episode where BP was OK, however HR dropped to 30s- thought to be vasovagal- as per below #14- cont Abd binder and thigh high TEDs  9.  History of CVA with right-sided carotid occlusion.  Continue aspirin and Brilinta 10.  Hypothyroidism.  Synthroid 11.  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  Follow-up outpatient 12.  H/o of hyperlipidemia.  LDL 20, HDL 45, VLDL 9, total cholesterol 91, TG 47. No current evidence of hyperlipidemia so will d/c Crestor 13. Recurrent aspiration pneumonias: patient prefers to consume oral food and defers PEG, discussed with patient and  SLP Hx radiation induced esophageal stricture, hx tongue Ca, may have pharyngeal dysphagia as a result as well  14. Vasovagal episode  11/12- occurred overnight- HR to 30s- rapid response called- question if passed out- pt said no; staff said yes- per note, was staring blankly- will con't to monitor- has chronic orthstasis, so could have played a role-  16.  Myasthenia gravis - Cont mestinon, follows with Dr Felecia Shelling  L HipTrochanteric bursa injection without ultrasound guidance  Indication Trochanteric bursitis. Exam has tenderness over the greater trochanter of the hip. Pain has not responded to conservative care such as exercise therapy and oral medications. Pain interferes with sleep or with mobility Informed consent was obtained after describing risks and benefits of the procedure with the patient these include bleeding bruising and infection. Patient has signed written consent form. Patient placed in a lateral decubitus position with the affected hip superior. Point of maximal pain was palpated marked and prepped with Betadine and entered with a needle to bone contact. Needle slightly withdrawn then 59m of  betamethasone with 4 cc 1% lidocaine were injected. Patient tolerated procedure well. Post procedure instructions given.  LOS: 6 days A FACE TO FACE EVALUATION WAS PERFORMED  ACharlett Blake11/15/2022, 7:13 AM

## 2021-08-06 NOTE — Progress Notes (Addendum)
Pt wanted sleep aid to help him rest at night. One time dose of 3 mg melatonin. Pt also states pain in L hip is not controlled by current pain medication regiment. Pt states he is hopeful to receive an injection to assist with his pain today.

## 2021-08-06 NOTE — Patient Care Conference (Signed)
Inpatient RehabilitationTeam Conference and Plan of Care Update Date: 08/06/2021   Time: 12:08 PM    Patient Name: George Bradley      Medical Record Number: [id number]  Date of Birth: Jun 07, 1941 Sex: Male         Room/Bed: 4M05C/4M05C-01 Payor Info: Payor: MEDICARE / Plan: MEDICARE PART A AND B / Product Type: *No Product type* /    Admit Date/Time:  07/31/2021  5:15 PM  Primary Diagnosis:  SIRS (systemic inflammatory response syndrome) [facility])  Hospital Problems: Principal Problem:   SIRS (systemic inflammatory response syndrome) [facility])    Expected Discharge Date: Expected Discharge Date: 08/09/21  Team Members Present: Physician leading conference: Dr. Alger Simons Social Worker Present: Ovidio Kin, LCSW Nurse Present: Dorien Chihuahua, RN PT Present: Francena Hanly, PT OT Present: Meriel Pica, OT SLP Present: Weston Anna, SLP PPS Coordinator present : Gunnar Fusi, SLP     Current Status/Progress Goal Weekly Team Focus  Bowel/Bladder     Continent   Continent   Toileting  Swallow/Nutrition/ Hydration   Dys. 3 textures with thin liquids, Max A for use of swallow strategies  Mod A  increase use of swallow strategies, tolerance of current diet   ADL's   CGA overall  CGA overall  pt is ready to go home, will assist pt and wife with discharge preparation   Mobility   CGA transfers, gait up to 150' w/RW  S*  Family/pt edu, endurance, balance, gait   Communication             Safety/Cognition/ Behavioral Observations  At baseline per family         Pain     Neck and hip pain   Pain managed with prn meds   Monitor need for and effectiveness of medications  Skin     Stage 1 on buttocks with MASD   Skin healing   Skin care treatments and monitoring healing    Discharge Planning:  Wife was here yesterday and completed education-has been caring for pt prior to admission. Will make sure set up with [facility] per pt and wife's choice. Has all DME. Want to go  home this week   Team Discussion: Patient with aspiration pna and c/o left hip pain that limits participation and effects mood. Patient with history of vestibular issues, dizziness exacerbated but VSS. Note acute change in mentation related to back pain,  demanding to be discharged and refusing therapy sessions. Wife reports patient is stubborn and requested scheduled anxiety medication as taking PTA.  Patient on target to meet rehab goals: Patient was doing well until today; activity limited by pain. Pain addressed. Chronic dysphagia treated; patient does better with a straw on D3 thin but is impulsive.  *See Care Plan and progress notes for long and short-term goals.   Revisions to Treatment Plan:  Ice to hip after therapy and stretching exercises before therapy   Teaching Needs: Safety, transfers, medications, etc.   Current Barriers to Discharge: Decreased caregiver support and Home enviroment access/layout (patient has 3 steps to entry)  Possible Resolutions to Barriers: Family education with wife Conservator, museum/gallery Summary Current Status: debility related SIRS/sepsis. orthostasis/hypotension an issue. weaning oxygen as able  Barriers to Discharge: Medical stability   Possible Resolutions to [employer] Focus: daily assessment of labs and patient data. bp support with teds, abd binder   Continued Need for Acute Rehabilitation Level of Care: The patient requires daily medical management by a  physician with specialized training in physical medicine and rehabilitation for the following reasons: Direction of a multidisciplinary physical rehabilitation program to maximize functional independence : Yes Medical management of patient stability for increased activity during participation in an intensive rehabilitation regime.: Yes Analysis of laboratory values and/or radiology reports with any subsequent need for medication adjustment and/or medical intervention. : Yes   I  attest that I was present, lead the team conference, and concur with the assessment and plan of the team.   Dorien Chihuahua B 08/06/2021, 4:26 PM

## 2021-08-06 NOTE — Progress Notes (Signed)
Occupational Therapy Session Note  Patient Details  Name: [name] MRN: [id number] Date of Birth: Jan 22, 1941  Today's Date: 08/06/2021 OT Individual Time: 1431-1500 OT Individual Time Calculation (min): 29 min    Short Term Goals: Week 1:  OT Short Term Goal 1 (Week 1): STGs = LTGs  Skilled Therapeutic Interventions/Progress Updates:  Pt greeted  supine in bed   agreeable to OT intervention. Session focus on ADL transfers and functional mobility. Pt completed bed mobility with CGA with education provided on using gait belt around LLE as pt reports increased pain in L hip. Pt completed sit<>stand from EOB with MIN A with Rw. Pt endorses dizziness upon standing needing to return to sitting, donned ABD and had pt complete additional stand with MIN A with RW, BP 118/84(97) SpO2 91% on RA in standing.  Worked on Medical laboratory scientific officer transfer to [employer] as pt reports walk in shower with seat at home, pt declining use of RW when entering shower wanting to use grab bar only on R side however recommend pt use RW to step into shower as pt does report RW will fit into shower and pt would benefit from BUE when stepping over threshold. If RW will not fit into shower may practice stepping backwards into shower with rw. Pt completed simulated shower transfer with MIN A and MOD cues for sequencing and body mechanics. Pt completed ~ 3 ft of functional ambulation back to bed with rw and MIN A. CGA for bed mobility with  pt left supine in bed with bed alarm activated and all needs within reach.                   Therapy Documentation Precautions:  Precautions Precautions: Fall, Other (comment) Precaution Comments: monitor vitals Restrictions Weight Bearing Restrictions: No  Pain: pt reports unrated pain in L hip, provided rest breaks and compensatory methods to accommodate for pain.     Therapy/Group: Individual Therapy  [name] 08/06/2021, 3:52 PM

## 2021-08-07 LAB — CREATININE, SERUM
Creatinine, Ser: 1.15 mg/dL (ref 0.61–1.24)
GFR, Estimated: >60 mL/min (ref >60)

## 2021-08-07 MED ORDER — FLUDROCORTISONE ACETATE 0.1 MG PO TABS
0.2000 mg | ORAL_TABLET | Freq: Every day | ORAL | Status: DC
  Administered 2021-08-07 – 2021-08-09 (×3): 0.2 mg via ORAL
  Filled 2021-08-07 (×3): qty 2

## 2021-08-07 NOTE — Progress Notes (Addendum)
Patient ID: George Bradley, male   DOB: Jul 11, 1941, 80 y.o.   MRN: [id number]  Hospice referral made per wife's request pt is also in agreement with. Faxed information to [facility] per wife's request, informed of discharge date 11/18. Has all equipment needs from previous admissions and per wife does not use assistive device at home even though recommended.  11:55 AM Met with wife when here to inform referral made to [facility] and they will be reaching out to her. Pt is doing better today and ambulating in the hallway. He is on board for discharge Friday, will move toward this plan.

## 2021-08-07 NOTE — Progress Notes (Signed)
Speech Language Pathology Discharge Summary  Patient Details  Name: George Bradley MRN: [id number] Date of Birth: 1940/10/08  Today's Date: 08/08/2021 SLP Individual Time: 1300-1330 SLP Individual Time Calculation (min): 30 min   Skilled Therapeutic Interventions:  Patient seen for skilled ST session focusing on swallow function goals in anticipation of discharge home tomorrow. Wife not present, however previous SLP has completed education. During today's session, SLP reviewed with patient swallowing precautions and showed him difference between 5cc and 10cc using measurement on medicine cup. Patient drank 5cc and then 10cc of water. With 10cc amount he reported "I can live with that". No overt s/s aspiration or penetration with any of the small sips of water he took. Patient appears to have a general knowledge and understanding of swallowing precautions, however he will continue to benefit from supervision and assistance from family to adhere to precautions and reduce risk of aspiration. Patient left in bed with all needs within reach. He is appropriate for discharge.     Patient has met 2 of 2 long term goals.  Patient to discharge at overall Mod level.   Clinical Impression/Discharge Summary:   Pt has made functional gains during CIR stay and has met 2 of 2 long term goals for swallow function. 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 Focus of ST tx on pt and family education for safest intake of Dys 3 diet as well as both NTL and thin liquids no straw. Pt continues to benefit from min-mod A cues for use of compensatory swallow strategies, often forgets to alternate solids and liquids. Pt remains at risk for aspiration due to anatomical and physiological swallow impairments. Wife has been very involved with education and would like for pt to consume NTL at home (she has purchased large bottle of Simply Thick, pt is also agreeable). Recommend  discharge on Dys 3 diet, NTL with use of 5-10cc Provale cup for thin liquids, 24/7 supervision, full supervision at meals and continued aggressive oral care. No follow up ST services recommended at this time due to pt at baseline swallow function.   Care Partner:  Caregiver Able to Provide Assistance: Yes  Type of Caregiver Assistance: Physical;Cognitive  Recommendation:  24 hour supervision/assistance  Rationale for SLP Follow Up: Maximize swallowing safety;Reduce caregiver burden   Equipment: 5cc Provale cup for thin liquids (wife plans to order 10cc).  Reasons for discharge: Treatment goals met;Discharged from hospital   Patient/Family Agrees with Progress Made and Goals Achieved: Yes    Dewaine Conger 08/07/2021, 11:54 AM  Sonia Baller, MA, CCC-SLP Speech Therapy

## 2021-08-07 NOTE — Progress Notes (Signed)
Physical Therapy Session Note  Patient Details  Name: George Bradley MRN: [id number] Date of Birth: 1941/05/23  Today's Date: 08/07/2021 PT Individual Time: 7893-8101 PT Individual Time Calculation (min): 54 min   Short Term Goals: Week 1:  PT Short Term Goal 1 (Week 1): STG = LTG due to ELOS   Skilled Therapeutic Interventions/Progress Updates:    Pt received sitting in recliner and agreeable to PT  PT assessed VS sitting in recliner.     Reclined. 114/73. HR 70 Sitting upright. 94/63 HR 70 unable to attin in standing. Pt noted to myoclonic response through the LUE . Retured into sitting in recliner and significant dizziness. 89/66 HR 83 after 3 minute rest break and  sitting. 122/78. Supervision assist for sit<>stand from recliner  Stand pivot transfer to bed with RW and supervision assist. Pt doffed abdominal binder without assist. Sit>supine wit supervision assist.   Pt instructed in supine therex: SAQ, SLR, heel slides, hip abduction, clam shells, ankle PF. Performed with level 3 tband. X 10-12 except ankle PF x 20. Prolonged rest breaks between bouts to prevent excessive fatigue . No pain reported in LLE on this day.  Supine BP following therex. 151/78  Pt left in bed with call bell in reach with all needs met.     Therapy Documentation Precautions:  Precautions Precautions: Fall, Other (comment) Precaution Comments: monitor vitals Restrictions Weight Bearing Restrictions: No    Vital Signs: Therapy Vitals Temp: 97.7 F (36.5 C) Pulse Rate: 70 Resp: 20 BP: 112/65 Patient Position (if appropriate): Lying Oxygen Therapy SpO2: 94 % O2 Device: Room Air Pain:   denies    Therapy/Group: Individual Therapy  Lorie Phenix 08/07/2021, 6:06 PM

## 2021-08-07 NOTE — Progress Notes (Signed)
PROGRESS NOTE   Subjective/Complaints: Left hip pain is basically gone overnite  ROS:  Pt denies SOB, abd pain, CP, N/V/C/D (+) peripheral neuropathy  Objective:   No results found. No results for input(s): WBC, HGB, HCT, PLT in the last 72 hours.  Recent Labs    08/06/21 2343  CREATININE 1.15     Intake/Output Summary (Last 24 hours) at 08/07/2021 1218 Last data filed at 08/07/2021 0815 Gross per 24 hour  Intake 240 ml  Output 600 ml  Net -360 ml      Pressure Injury 07/24/21 Sacrum Mid Stage 1 -  Intact skin with non-blanchable redness of a localized area usually over a bony prominence. (Active)  07/24/21 0450  Location: Sacrum  Location Orientation: Mid  Staging: Stage 1 -  Intact skin with non-blanchable redness of a localized area usually over a bony prominence.  Wound Description (Comments):   Present on Admission: Yes    Physical Exam: Vital Signs Blood pressure (!) 161/90, pulse 63, temperature 97.7 F (36.5 C), resp. rate 16, SpO2 93 %.  General: No acute distress Mood and affect are appropriate Heart: Regular rate and rhythm no rubs murmurs or extra sounds Lungs: Clear to auscultation, breathing unlabored, no rales or wheezes Abdomen: Positive bowel sounds, soft nontender to palpation, nondistended Extremities: No clubbing, cyanosis, or edema Skin: No evidence of breakdown, no evidence of rash  MSK- no ecchymosis or tenderness over L lateral hip,no pain with active hip flexion , add, abduction  Neurological: alert Neurological:     Comments: Patient is alert.  Makes eye contact with examiner.  Follows simple commands.  Provides name and age. Does not remember me as MD who performed injection yesterday LLE 4/5, RLE 5/5 upper extremities 5/5 strength   Assessment/Plan: 1. Functional deficits which require 3+ hours per day of interdisciplinary therapy in a comprehensive inpatient rehab  setting. Physiatrist is providing close team supervision and 24 hour management of active medical problems listed below. Physiatrist and rehab team continue to assess barriers to discharge/monitor patient progress toward functional and medical goals  Care Tool:  Bathing    Body parts bathed by patient: Left upper leg, Right upper leg   Body parts bathed by helper: Right lower leg, Left lower leg     Bathing assist Assist Level: Minimal Assistance - Patient > 75%     Upper Body Dressing/Undressing Upper body dressing   What is the patient wearing?: Pull over shirt    Upper body assist Assist Level: Supervision/Verbal cueing    Lower Body Dressing/Undressing Lower body dressing      What is the patient wearing?: Pants     Lower body assist Assist for lower body dressing: Maximal Assistance - Patient 25 - 49%     Toileting Toileting    Toileting assist Assist for toileting: Contact Guard/Touching assist     Transfers Chair/bed transfer  Transfers assist     Chair/bed transfer assist level: Minimal Assistance - Patient > 75%     Locomotion Ambulation   Ambulation assist      Assist level: Contact Guard/Touching assist Assistive device: Walker-rolling Max distance: 150   Walk 10 feet activity  Assist  Walk 10 feet activity did not occur: Safety/medical concerns (Fatigue, pain)  Assist level: Contact Guard/Touching assist Assistive device: Walker-rolling   Walk 50 feet activity   Assist Walk 50 feet with 2 turns activity did not occur: Safety/medical concerns (Fatigue, pain)  Assist level: Contact Guard/Touching assist Assistive device: Walker-rolling    Walk 150 feet activity   Assist Walk 150 feet activity did not occur: Safety/medical concerns (Fatigue, pain)  Assist level: Contact Guard/Touching assist Assistive device: Walker-rolling    Walk 10 feet on uneven surface  activity   Assist Walk 10 feet on uneven surfaces activity  did not occur: Safety/medical concerns (Fatigue, pain)         Wheelchair     Assist Is the patient using a wheelchair?: Yes Type of Wheelchair: Manual    Wheelchair assist level: Supervision/Verbal cueing Max wheelchair distance: 73'    Wheelchair 50 feet with 2 turns activity    Assist        Assist Level: Supervision/Verbal cueing   Wheelchair 150 feet activity     Assist      Assist Level: Moderate Assistance - Patient 50 - 74%   Blood pressure (!) 161/90, pulse 63, temperature 97.7 F (36.5 C), resp. rate 16, SpO2 93 %.    Medical Problem List and Plan: 1.  Debility secondary to SIRS/sepsis.  Completing course of Maxipime             -patient may shower             -ELOS/Goals: S 7-10 days             con't PT and OT/CIR Team conference today please see physician documentation under team conference tab, met with team  to discuss problems,progress, and goals. Formulized individual treatment plan based on medical history, underlying problem and comorbidities.  2.  Antithrombotics: -DVT/anticoagulation: Lovenox             -antiplatelet therapy: Aspirin 81 mg daily and Brilinta 90 mg twice daily 3. Chemotherapy induced peripheral neuropathy: Increase Neurontin to 1,000 mg 3 times daily oxycodone as needed. Cymbalta 82m daily prescribed. Hip pain on Left likely trochanteric bursitis, xray neg , corticosteroid injection may be of benefit , , also order flector patch  4. Anxiety: Aricept 5 mg nightly, continue Ativan 1 mg 3 times daily             -antipsychotic agents: N/A 5. Neuropsych: This patient is capable of making decisions on his own behalf. 6. Skin/Wound Care: Routine skin checks 7. Fluids/Electrolytes/Nutrition: Routine in and outs with follow-up chemistries 8.  CHRONIC Orthostasis.  Florinef 0.1 mg daily.  Monitor with increased mobility  11/12- had episode where BP was OK, however HR dropped to 30s- thought to be vasovagal- as per below #14-  cont Abd binder and thigh high TEDs  9.  History of CVA with right-sided carotid occlusion.  Continue aspirin and Brilinta 10.  Hypothyroidism.  Synthroid 11.  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  Follow-up outpatient 12.  H/o of hyperlipidemia.  LDL 20, HDL 45, VLDL 9, total cholesterol 91, TG 47. No current evidence of hyperlipidemia so will d/c Crestor 13. Recurrent aspiration pneumonias: patient prefers to consume oral food and defers PEG, discussed with patient and SLP Hx radiation induced esophageal stricture, hx tongue Ca, may have pharyngeal dysphagia as a result as well  14. Vasovagal episode  11/12- occurred overnight- HR to 30s- rapid response called- question if passed out- pt said no; staff said yes- per note, was staring blankly- will con't to monitor- has chronic orthstasis, so could have played a role-  16.  Myasthenia gravis - Cont mestinon, follows with Dr Felecia Shelling   Left trochanteric bursa injection performed, 11/15 LOS: 7 days A FACE TO FACE EVALUATION WAS PERFORMED  Charlett Blake 08/07/2021, 12:18 PM

## 2021-08-07 NOTE — Progress Notes (Signed)
Speech Language Pathology Daily Session Note  Patient Details  Name: George Bradley MRN: [id number] Date of Birth: 05/04/1941  Today's Date: 08/07/2021 SLP Individual Time: 1031-1058 SLP Individual Time Calculation (min): 27 min  Short Term Goals: Week 1: SLP Short Term Goal 1 (Week 1): STGs=LTGs due to ELOS  Skilled Therapeutic Interventions: Pt seen for skilled ST with continued focus on swallowing and family/pt education. Pt completing very thorough oral care with set up A, wife states since his lingual CA he completes very detailed (up to 20 minutes) oral care regime TID. Pt denying solids trials at this time, consuming thin via cup with no significant s/s aspiration. Pt recalls 3 out of 4 swallow strategies, verbal cues to recall alternating solids and liquids at 2:1 ratio. Wife and pt with all questions answered to satisfaction at this time. Pt left in bed with wife present for all needs, cont ST POC.   Pain Pain Assessment Pain Scale: 0-10 Pain Score: 0-No pain  Therapy/Group: Individual Therapy  Dewaine Conger 08/07/2021, 11:37 AM

## 2021-08-07 NOTE — Progress Notes (Signed)
Occupational Therapy Session Note  Patient Details  Name: George Bradley MRN: [id number] Date of Birth: 15-Mar-1941  Today's Date: 08/07/2021 OT Individual Time: 1100-1205 OT Individual Time Calculation (min): 65 min    Short Term Goals: Week 1:  OT Short Term Goal 1 (Week 1): STGs = LTGs  Skilled Therapeutic Interventions/Progress Updates:  Patient met lying supine in bed in agreement with OT treatment session. 8/10 pain reported at rest and with activity in L hip and below L knee. Patient also reporting mild pain in cervical neck (chronic). Topical gel applied to cervical neck for pain management. Declined pain meds. Supine to EOB with supervision A and sit to stand from EOB with Min guard and cues for hand placement. Functional mobility to [facility] over standard height toilet in bathroom with Min guard. Patient then completed 3/3 parts of toileting task with Min guard for safety. Insistent upon change of brief even though brief was clean and dry. Declined hand hygiene standing at sink level with request for hand sanitizer. Patient doffed short sleeved shirt seated EOB and donned long sleeved shirt with set-up assist. Functional mobility to ortho gym with use of RW and Min guard. Transfer to and from supine in mat table with supervision A. Patient moved through LE stretches of hip, quads and low back for pain management. Patient reports decreased pain after stretching. Session concluded with patient seated in recliner with call bell within reach, belt alarm activated and all needs met.   Therapy Documentation Precautions:  Precautions Precautions: Fall, Other (comment) Precaution Comments: monitor vitals Restrictions Weight Bearing Restrictions: No General:   Therapy/Group: Individual Therapy  [name] 08/07/2021, 11:45 AM

## 2021-08-07 NOTE — Discharge Summary (Signed)
Physician Discharge Summary  Patient ID: George Bradley MRN: [id number] DOB/AGE: 29-May-1941 80 y.o.  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 EXAM: CT ANGIOGRAPHY HEAD AND NECK TECHNIQUE: Multidetector CT imaging of the head and neck was performed using the standard protocol during bolus administration of intravenous contrast. Multiplanar CT image reconstructions and MIPs were obtained to evaluate the vascular anatomy. Carotid stenosis measurements (when applicable) are obtained utilizing NASCET criteria, using the distal internal carotid diameter as the denominator. CONTRAST:  75mL OMNIPAQUE IOHEXOL 350 MG/ML SOLN COMPARISON:  Head and neck CTA 04/27/2021.  Head MRI 07/28/2021. FINDINGS: CT HEAD FINDINGS Brain: There is no evidence of an acute infarct, intracranial hemorrhage, mass, midline shift, or extra-axial fluid collection. Small chronic infarcts are again noted in the right frontal and left occipital lobes. Hypodensities in the cerebral white matter bilaterally are nonspecific but compatible with mild chronic small vessel ischemic disease. There is mild cerebral atrophy. Vascular: Calcified atherosclerosis at the skull base. Skull: No fracture or suspicious osseous lesion. Sinuses: Clear paranasal sinuses. Small right and trace left mastoid effusions. Orbits: Bilateral cataract extraction. Review of the MIP images confirms the above findings CTA NECK FINDINGS Aortic arch:  Standard 3 vessel aortic arch with moderate calcified plaque. No significant arch vessel origin stenosis. Mild stenosis of the right subclavian artery origin due to predominantly calcified plaque. Right carotid system: Unchanged occlusion of the common carotid artery. No reconstitution of the cervical ICA. Reconstitution of some ECA branches. Left carotid system: Patent with a small amount of predominantly calcified plaque scattered throughout the common carotid artery and at the carotid bifurcation without significant stenosis. Vertebral arteries: Patent with the left being dominant. Calcified plaque at both vertebral origins with mild-to-moderate stenosis bilaterally, similar to the prior CTA. Skeleton: Mild-to-moderate disc and facet degeneration in the cervical spine. Other neck: Post radiation changes. No evidence of cervical lymphadenopathy or mass. Upper chest: Biapical lung scarring consistent with prior radiation therapy. Review of the MIP images confirms the above findings CTA HEAD FINDINGS Anterior circulation: The intracranial right ICA is occluded proximally with distal reconstitution primarily of the supraclinoid segment and ICA terminus. The intracranial left ICA is patent with mild-to-moderate calcified plaque not resulting in significant stenosis. ACAs and MCAs are patent without evidence of a proximal branch occlusion or significant proximal stenosis. No aneurysm is identified. Posterior circulation: The intracranial vertebral arteries are patent to the basilar with mild atherosclerotic irregularity bilaterally but no flow limiting stenosis. Patent PICA and SCA origins are identified bilaterally. The basilar artery is widely patent. There is a small right posterior communicating artery. Both PCAs are patent without evidence of a significant proximal stenosis. No aneurysm is identified. Venous sinuses: Patent. Anatomic variants: None. Review of the MIP images confirms the above findings IMPRESSION:  1. 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 2. 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 3. Aortic Atherosclerosis (ICD10-I70.0). Electronically Signed   By: Logan Bores M.D.   On: 07/28/2021 19:57   CT HEAD WO CONTRAST (5MM)  Result Date: 07/27/2021 CLINICAL DATA:  Left lower extremity weakness EXAM: CT HEAD WITHOUT CONTRAST TECHNIQUE: Contiguous axial images were obtained from the base of the skull through the vertex without intravenous contrast. COMPARISON:  None. FINDINGS: Brain: There is no mass, hemorrhage or extra-axial collection. The size and configuration of the ventricles and extra-axial CSF spaces are normal. The brain parenchyma is normal, without acute or chronic infarction. Vascular: No abnormal hyperdensity of the major intracranial arteries or dural venous sinuses. No intracranial atherosclerosis. Skull: The visualized skull base, calvarium and extracranial soft tissues are normal. Sinuses/Orbits: No fluid levels or advanced mucosal thickening of the visualized paranasal sinuses. No mastoid or middle ear effusion. The orbits are normal. IMPRESSION: Normal head CT. Electronically Signed   By: Ulyses Jarred M.D.   On: 07/27/2021 03:19   CT Head Wo Contrast  Result Date: 07/22/2021 CLINICAL DATA:  Altered mental status EXAM: CT HEAD WITHOUT CONTRAST TECHNIQUE: Contiguous axial images were obtained from the base of the skull through the vertex without intravenous contrast. COMPARISON:  MRI 06/12/2021 FINDINGS: Brain: Normal anatomic configuration of the brain. Parenchymal volume is relatively well preserved given the patient's age. Focal encephalomalacia in keeping with remote cortical infarct noted within the left occipital cortex. No evidence of acute intracranial hemorrhage or infarct. No abnormal mass effect or midline shift. No abnormal intra or extra-axial mass lesion or fluid collection.  Ventricular size is normal. Cerebellum is unremarkable. Vascular: No hyperdense vessel or unexpected calcification. Skull: Normal. Negative for fracture or focal lesion. Sinuses/Orbits: Ocular lenses have been removed the orbits are otherwise unremarkable. Paranasal sinuses are clear save for a small mucous retention cyst within the right frontal sinus. Other: Mastoid air cells and middle ear cavities are clear. IMPRESSION: No acute intracranial abnormality. Small remote left occipital cortical infarct. Electronically Signed   By: Fidela Salisbury M.D.   On: 07/22/2021 20:44   MR BRAIN WO CONTRAST  Result Date: 07/28/2021 CLINICAL DATA:  Neuro deficit, acute, stroke suspected. History of stroke and right carotid artery occlusion. EXAM: MRI HEAD WITHOUT CONTRAST TECHNIQUE: Multiplanar, multiecho pulse sequences of the brain and surrounding structures were obtained without intravenous contrast. COMPARISON:  Head CT 07/27/2021 and MRI 06/12/2021 FINDINGS: Brain: There is no evidence of an acute infarct, mass, midline shift, or extra-axial fluid collection. Small chronic cortical infarcts are again noted in the right frontal and left occipital lobes with a small amount of hemosiderin deposition associated with the latter. Small T2 hyperintensities in the cerebral white matter bilaterally are unchanged from the prior MRI and are nonspecific but compatible with mild chronic small vessel ischemic disease. There is mild cerebral atrophy. A chronic microhemorrhage is noted in the left centrum semiovale. There is also chronic asymmetric hemosiderin deposition or mineralization in the right lentiform nucleus. A tiny chronic left cerebellar infarct is unchanged. Vascular: Known occlusion of the right internal carotid artery. Skull and upper cervical spine: Unremarkable bone marrow signal. Sinuses/Orbits: Bilateral cataract extraction. Clear paranasal sinuses. Small bilateral mastoid effusions. Other: None. IMPRESSION: 1. No  acute intracranial abnormality. 2. 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 Electronically Signed   By: Logan Bores M.D.   On: 07/28/2021 16:56   DG Pelvis Portable  Result Date: 07/22/2021 CLINICAL DATA:  Fall. EXAM: PORTABLE PELVIS 1-2 VIEWS COMPARISON:  CT chest abdomen and pelvis 05/30/2021. FINDINGS: There is no evidence of pelvic fracture or diastasis. No pelvic bone lesions are seen. There are vascular calcifications in the soft tissues. IMPRESSION: Negative. Electronically Signed  By: Ronney Asters M.D.   On: 07/22/2021 19:48   DG CHEST PORT 1 VIEW  Result Date: 07/26/2021 CLINICAL DATA:  Syncopal episode. EXAM: PORTABLE CHEST 1 VIEW COMPARISON:  Portable chest 07/23/2021 FINDINGS: The cardiac size is normal. There are median sternotomy sutures again noted. The aorta is tortuous and calcified. no vascular congestion is seen. The lungs show mild chronic changes without focal pneumonia with interval clearance of prior right infrahilar opacity which was probably atelectasis. Osteopenia and thoracic spondylosis. IMPRESSION: No evidence of acute chest disease.  Chronic change. Electronically Signed   By: Telford Nab M.D.   On: 07/26/2021 22:41   DG CHEST PORT 1 VIEW  Result Date: 07/23/2021 CLINICAL DATA:  80 year old male with shortness of breath, weakness, left side deficit, declining mental status. EXAM: PORTABLE CHEST 1 VIEW COMPARISON:  Portable chest 07/22/2021 and earlier. FINDINGS: Portable AP semi upright view at 0411 hours. Mildly lower lung volumes since September. Increased patchy opacity at the medial right lung base. But left lung base ventilation has improved since yesterday. No superimposed pneumothorax, pulmonary edema or pleural effusion. Stable cardiac size and mediastinal contours. Prior CABG. Visualized tracheal air column is within normal limits. Paucity of bowel gas in the upper abdomen. No acute osseous abnormality identified.  IMPRESSION: 1. Increasing opacity at the medial right lung base. Cannot exclude aspiration and/or pneumonia. 2. No other acute cardiopulmonary abnormality. Electronically Signed   By: Genevie Ann M.D.   On: 07/23/2021 04:26   DG Chest Port 1 View  Result Date: 07/22/2021 CLINICAL DATA:  Sepsis EXAM: PORTABLE CHEST 1 VIEW COMPARISON:  06/15/2021 FINDINGS: Unchanged median sternotomy. Mild cardiomegaly with calcific aortic atherosclerosis. Mild pulmonary vascular congestion without overt pulmonary edema. No focal airspace consolidation. Normal pleural spaces. IMPRESSION: Cardiomegaly and mild pulmonary vascular congestion without overt pulmonary edema. Electronically Signed   By: Ulyses Jarred M.D.   On: 07/22/2021 19:45   EEG adult  Result Date: 07/28/2021 Willaim Rayas, MD     07/28/2021  5:55 PM [facility] Routine EEG Report Patient Name:   George Bradley, George Bradley Date of Birth:   04/30/1941 Identification Number:   MRN - [id number] Date of Study:   07/28/2021 17:29:18 Indication: Encephalopathy, Technical Summary: A routine 20 channel electroencephalogram using the international 10-20 system of electrode placement was performed. Background: 5-6 Hz, Poorly formed States      Awake      Drowsy: were seen during drowsiness Abnormalities Generalized Slowing: Diffuse generalized slowing Background Slowing: The background consists of 20-50 uV, 5-6 Hz diffuse activity with superimposed diffuse polymorphic delta activity that is non reactive to external stimulation. GPDs: Occasional 1 hz generalized sharp wave discharges with triphasic morphology. Occasionally represented better lateralized but favored to represent generalized pathology. Activation Procedures Hyperventilation: Not performed Photic Stimulation: Not performed Classification: Abnormal : Diagnosis: This is abnormal EEG. The presence of mild diffuse slowing and intermixed generalized triphasic waves is consistent  with nonspecific generalized neuronal dysfunction and can be seen with metabolic encephalopathy. No seizures are noted. Dr Apolinar [facility] [phone] Case [id number]   DG HIP UNILAT WITH PELVIS 2-3 VIEWS LEFT  Result Date: 07/29/2021 CLINICAL DATA:  Hip pain EXAM: DG HIP (WITH OR WITHOUT PELVIS) 2-3V LEFT COMPARISON:  None. FINDINGS: The LEFT hip is located. No femoral neck fracture. Minimal arthropathy. IMPRESSION: No LEFT femoral neck fracture Electronically Signed   By: Suzy Bouchard M.D.   On: 07/29/2021 16:13    Labs:  Basic Metabolic Panel:  Recent Labs  Lab 08/02/21 0541 08/06/21 2343  NA 136  --   K 3.9  --   CL 100  --   CO2 29  --   GLUCOSE 101*  --   BUN 13  --   CREATININE 1.04 1.15  CALCIUM 8.7*  --     CBC: Recent Labs  Lab 08/02/21 0541  WBC 10.3  NEUTROABS 6.9  HGB 11.5*  HCT 34.9*  MCV 91.6  PLT 254    CBG: No results for input(s): GLUCAP in the last 168 hours.   Family history.  Father with CAD and peptic ulcer disease Sister with CAD and breast cancer.  Mother with dementia.  Denies any colon cancer esophageal cancer or rectal cancer  Brief HPI:   George Bradley is a 80 y.o. 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  Per chart review patient lives with spouse two-level home.  Wife helps with ADLs.  Presented 07/22/2021 with altered mental status and fever.  COVID testing and flu test was negative.  Cranial CT scan showed no acute abnormality.  Small remote left occipital  cortical infarct.  Chest x-ray cardiomegaly.  Admission chemistries unremarkable except creatinine 1.26 WBC 12,500 troponin 19-40 blood cultures no growth to date.  He was weaned from oxygen.  Maintained on Lovenox for DVT prophylaxis.  On 07/26/2021 with syncopal episode after receiving IV Lopressor.  Due to concern for arrhythmia induced syncope cardiology consulted and felt syncope not related to arrhythmia but due to a vasovagal episode.  Neurology was consulted EEG showed no evidence of seizure.  MRI negative for acute changes.  CT of the head and neck no significant changes from prior findings.  Patient with multifactorial dysphagia follow-up gastrology services maintain on mechanical soft diet.  Gastroenterology did not feel repeat EGD and dilation would be needed as patient did have a history of recurrent radiation-induced esophageal strictures and history of tongue cancer.  There was concern for possible aspiration and completed course of Unasyn.  Therapy evaluations completed due to patient decreased functional mobility was admitted for a comprehensive rehab program   Hospital Course: George Bradley was admitted to rehab 07/31/2021 for inpatient therapies to consist of PT, ST and OT at least three hours five days a week. Past admission physiatrist, therapy team and rehab RN have worked together to provide customized collaborative inpatient rehab.  Pertaining to patient's debility secondary to SIRS/sepsis completed course of Maxipime remained afebrile.  Lovenox for DVT prophylaxis.  Remained on chronic aspirin and Brilinta per cardiology services.  Chemotherapy-induced peripheral neuropathy maintained on Neurontin titrated as directed as well as Cymbalta.  He did have some left trochanteric bursitis x-rays negative Flector patch as directed.  He continued on Aricept Ativan and for anxiety as prior to admission.  Chronic orthostasis Florinef as directed abdominal binder thigh-high support hose.  History of  CVA right side carotid occlusion as noted continue aspirin and Brilinta no bleeding episodes.  Synthroid ongoing for hypothyroidism.  History of tongue cancer radiation chemotherapy chronic dysphagia follow-up per speech therapy he had had multiple esophageal dilations in  the past by gastroenterology services.  Myasthenia gravis Mestinon as directed follow-up outpatient Dr. Felecia Shelling.   Blood pressures were monitored on TID basis and soft and monitored     Rehab course: During patient's stay in rehab weekly team conferences were held to monitor patient's progress, set goals and discuss barriers to discharge. At admission, patient required minimal assist 20 feet rolling walker moderate assist sit to supine  Physical exam.  Blood pressure 121/66 pulse 67 temperature 97.8 respirations 18 oxygen saturation 90% room air Constitutional.  No acute distress HEENT Head.  Normocephalic and atraumatic Eyes.  Pupils round and reactive to light no discharge without nystagmus Neck.  Supple nontender no JVD without thyromegaly Cardiac regular rate rhythm mildly extra sounds or murmur heard Abdomen.  Soft nontender positive bowel sounds without rebound Skin.  Warm and dry Neurologic.  Alert makes eye contact with examiner follows commands provides name and age.  Limited medical historian.  Left lower extremity 3/5, right lower extremity 5/5 upper extremities 5/5 strength  He/She  has had improvement in activity tolerance, balance, postural control as well as ability to compensate for deficits. He/She has had improvement in functional use RUE/LUE  and RLE/LLE as well as improvement in awareness.  Emphasis on sessions for improving activity tolerance.  He can don his support hose shoes abdominal binder with max assist.  Sit to stand minimal assist for posterior lean.  Stand to sit with minimal assist due to some fatigue.  Ambulates 120 feet contact-guard rolling walker.  Minimal assist for donning pants over feet  contact-guard minimal assist for coming from sit to stand for ADLs.  Set up for upper body dressing max is lower body dressing.  Speech therapy follow-up for dysphagia and current diet restrictions.  Full family teaching completed plan discharged to home with hospice to follow.       Disposition: Discharged home   Diet: Dysphagia #3 thin liquids  Special Instructions: No driving smoking or alcohol  Medications at discharge 1.  Aspirin 81 mg p.o. daily 2.  Flector patch 1.3% change every 12 hours 3.  Colace 100 mg daily as needed 4.  Aricept 5 mg p.o. nightly 5.  Cymbalta 20 mg p.o. daily 6.  Florinef 0.2 mg daily 7.  Neurontin 900 mg p.o. 3 times daily 8.  Synthroid 100 mcg p.o. daily 9.  Ativan 0.5 mg p.o. 3 times daily 10.  Melatonin 3 mg p.o. nightly 11.  Oxycodone 5 mg every 4 hours as needed severe pain 12.  Pilocarpine 7.5 mg p.o. 3 times daily 13.  Mestinon 60 mg p.o. 3 times daily 14.  Brilinta 90 mg p.o. twice daily 15.  Crestor 20 mg daily  30-35 minutes were spent completing discharge summary and discharge planning  Discharge Instructions     Ambulatory referral to Physical Medicine Rehab   Complete by: As directed    Moderate complexity follow-up 1 to 2 weeks SIRS        Follow-up Information     Raulkar, Clide Deutscher, MD Follow up.   Specialty: Physical Medicine and Rehabilitation Why: Office to call for appointment Contact information: [address] [place] [address] [phone]         Rogene Houston, MD Follow up.   Specialty: Gastroenterology Why: As needed Contact information: [place], [address] [place] [address] [phone]         Martinique, Peter M, MD Follow up.   Specialty: Cardiology Why: As  needed Contact information: [address] [place] [address] [phone]         Britt Bottom, MD Follow up.   Specialty: Neurology Why: Call for appointment Contact information: [place] [address] [phone]                 Signed: Cathlyn Parsons 08/09/2021, 5:16 AM

## 2021-08-07 NOTE — Progress Notes (Signed)
Physical Therapy Session Note  Patient Details  Name: George Bradley MRN: [id number] Date of Birth: 09-Nov-1940  Today's Date: 08/07/2021 PT Individual Time: 2334-3568 and 1420-1509 PT Individual Time Calculation (min): 23 min and 49 min   Short Term Goals: Week 1:  PT Short Term Goal 1 (Week 1): STG = LTG due to ELOS  Skilled Therapeutic Interventions/Progress Updates:    Session 1: Pt received supine in bed and agreeable to therapy session; however, reports having been incontinent of bladder. Performed supine peri-care for time management - total assist to manage brief, set-up assist for anterior peri-care, and total assist for posterior peri-care for cleanliness. Donned thigh high TED hose total assist. Supine>sitting L EOB with supervision. Sitting EOB donned pants with set-up assist. Donned abdominal binder. Sit>stand EOB>RW with CGA for steadying and pt able to pull pants up over hips and fasten with CGA. L lateral side stepping towards HOB for improved positioning in bed as pt requesting to return to bed and rest until next session. Sit>supine with supervision. Pt left supine in bed with needs in reach and bed alarm on.  Session 2: Pt received supine in bed with NT present as pt reports need to use bathroom. Pt received and maintained on room air. Pt agreeable to therapy session therefore therapist assumed care of patient. Pt already wearing B LE thigh high TED hose. Supine>sitting L EOB, HOB slightly elevated and using bedrails, supervision. Donned abdominal binder. Sit>stand using RW with CGA for safety. Gait ~71ft into bathroom using RW with CGA - once standing in front of BSC over toilet pt goes to take hands off RW to manage LB clothing, when he suddenly starts having large tremors/shaking/jerking of LEs into knee flexion/extension requiring him to place hands back on RW and therapist providing heavy min assist for balance safety while quickly performing total assist LB clothing management  so pt could sit on BSC. Pt reports he is "a bit" lightheaded - vitals: BP 98/76 (MAP 84), HR 110bpm, SpO2 95% Pt states he correlates this "shaking" to being lightheaded. Pt continent of bowels and performed posterior peri-care in standing with light min assist for balance safety and pt able to utilize 1 to no UE support on RW during this with no significant LE shaking/jerking as was seen before. Provided seated rest break for BP management prior to ambulating ~42ft to sink using RW CGA. Standing hand hygiene with CGA for safety. Pt with slight LE jerking after this prolonged standing when going to sit in w/c. Reassessed vitals: BP 138/84 (MAP 99), HR 79bpm, SpO2 96% with pt reporting 2/10 lightheadedness.Transported to/from gym in w/c for time management and energy conservation. Gait training ~172ft using RW with CGA - demos reciprocal stepping pattern with slow gait speed and excessive anterior trunk flexion. Pt reports 7/10 pain in L hip during gait that reduces to 0/10 during seated break. Vitals: BP 127/76 (MAP 92), HR 86bpm, SpO2 92-96% . Educated pt on step-to pattern for stair navigation to minimize L hip pain - performed 4 steps with CGA/light min assist for balance due to slight posterior bias on descent but not significant vitals: BP 105/88 (MAP 95), HR 72bpm, SpO2 94-96% . Transported back to room. Short distance ~26ft to recliner using RW with CGA - pt attempting to stand and readjust pants but then starts to have the "tremor" in L UE therefore sat down in recliner. Reassessed vitals: BP 102/69 (MAP 78), HR 77bpm, SpO2 91-96%. Therapist made nurse aware of his low  BP and wrote on safety plan to wear TEDs and abdominal binder when getting OOB. Pt left seated in recliner with needs in reach, both TEDs and binder still donned, with B LEs elevated, and seat belt alarm on.  Therapy Documentation Precautions:  Precautions Precautions: Fall, Other (comment) Precaution Comments: monitor  vitals Restrictions Weight Bearing Restrictions: No   Pain:  Session 1: Reports L hip feels much better compared to yesterday stating the injection really helped.   Session 2: Reports 7/10 pain in L hip during gait but reports decreases back to 0/10 during seated rests therefore provided seated breaks throughout session for pain management.   Therapy/Group: Individual Therapy  Tawana Scale , PT, DPT, NCS, CSRS 08/07/2021, 7:52 AM

## 2021-08-08 ENCOUNTER — Other Ambulatory Visit (HOSPITAL_COMMUNITY): Payer: Self-pay

## 2021-08-08 MED ORDER — DULOXETINE HCL 20 MG PO CPEP
20.0000 mg | ORAL_CAPSULE | Freq: Every day | ORAL | 0 refills | Status: AC
  Filled 2021-08-08: qty 30, 30d supply, fill #0

## 2021-08-08 MED ORDER — MELATONIN 3 MG PO TABS
3.0000 mg | ORAL_TABLET | Freq: Every day | ORAL | 0 refills | Status: AC
  Filled 2021-08-08: qty 30, 30d supply, fill #0

## 2021-08-08 MED ORDER — BRILINTA 90 MG PO TABS
90.0000 mg | ORAL_TABLET | Freq: Two times a day (BID) | ORAL | 0 refills | Status: AC
  Filled 2021-08-08: qty 60, 30d supply, fill #0

## 2021-08-08 MED ORDER — FLUDROCORTISONE ACETATE 0.1 MG PO TABS
0.2000 mg | ORAL_TABLET | Freq: Every day | ORAL | 0 refills | Status: AC
  Filled 2021-08-08: qty 30, 15d supply, fill #0

## 2021-08-08 MED ORDER — ROSUVASTATIN CALCIUM 20 MG PO TABS
20.0000 mg | ORAL_TABLET | Freq: Every day | ORAL | 0 refills | Status: AC
  Filled 2021-08-08: qty 30, 30d supply, fill #0

## 2021-08-08 MED ORDER — LORAZEPAM 0.5 MG PO TABS
0.5000 mg | ORAL_TABLET | Freq: Three times a day (TID) | ORAL | 0 refills | Status: DC
  Filled 2021-08-08: qty 90, 30d supply, fill #0

## 2021-08-08 MED ORDER — PYRIDOSTIGMINE BROMIDE 60 MG PO TABS
60.0000 mg | ORAL_TABLET | Freq: Three times a day (TID) | ORAL | 0 refills | Status: AC
  Filled 2021-08-08: qty 90, 30d supply, fill #0

## 2021-08-08 MED ORDER — OXYCODONE HCL 5 MG PO TABS: 5.0000 mg | ORAL_TABLET | ORAL | 0 refills | Status: AC | PRN

## 2021-08-08 MED ORDER — GABAPENTIN 300 MG PO CAPS
900.0000 mg | ORAL_CAPSULE | Freq: Three times a day (TID) | ORAL | 0 refills | Status: AC
  Filled 2021-08-08: qty 270, 30d supply, fill #0

## 2021-08-08 MED ORDER — OXYCODONE HCL 5 MG PO TABS
5.0000 mg | ORAL_TABLET | ORAL | 0 refills | Status: DC | PRN
  Filled 2021-08-08: qty 20, 4d supply, fill #0

## 2021-08-08 MED ORDER — LORAZEPAM 1 MG PO TABS: 1.0000 mg | ORAL_TABLET | Freq: Three times a day (TID) | ORAL | 0 refills | Status: AC | PRN

## 2021-08-08 MED ORDER — DICLOFENAC EPOLAMINE 1.3 % EX PTCH
1.0000 | MEDICATED_PATCH | Freq: Two times a day (BID) | CUTANEOUS | 0 refills | Status: AC
  Filled 2021-08-08 (×2): qty 60, 30d supply, fill #0

## 2021-08-08 MED ORDER — PILOCARPINE HCL 7.5 MG PO TABS
7.5000 mg | ORAL_TABLET | Freq: Three times a day (TID) | ORAL | 0 refills | Status: AC
  Filled 2021-08-08: qty 90, 30d supply, fill #0

## 2021-08-08 NOTE — Progress Notes (Signed)
Physical Therapy Session Note  Patient Details  Name: George Bradley MRN: [id number] Date of Birth: November 21, 1940  Today's Date: 08/08/2021 PT Individual Time: 2761-4709 PT Individual Time Calculation (min): 32 min  and Today's Date: 08/08/2021 PT Missed Time: 13 Minutes Missed Time Reason: Other (Comment) (PT held up with another patient)  Short Term Goals: Week 1:  PT Short Term Goal 1 (Week 1): STG = LTG due to ELOS  Skilled Therapeutic Interventions/Progress Updates:  Pt received supine in bed, wife present, reported 2/10 pain in L hip and was premedicated. Wife reported significant orthostasis this AM, pt had ted hose donned. Emphasis of session on improved activity tolerance and prepare for DC home tomorrow. Pt performed supine <>sit mod I w/bedrail, BP seated EOB at 90/59 mmHg, pt symptomatic. Educated pt to perform ankle pumps and diaphragmatic breathing. After two minutes at EOB, pt's BP at 94/58 mmHg, pt still symptomatic. Pt requested to lay back down, performed sit <>supine mod I and encouraged pt to perform ankle pumps in supine. After two minutes supine, pt's BP at 140/66 mmHg. Pt's wife cleared to transfer pt to/from bathroom and recliner in room to promote physical activity between sessions and improve cardiovascular response. Pt was left supine in bed, wife present, all needs in reach. Missed 13 minutes of skilled PT secondary to previous pt care needs.    Therapy Documentation Precautions:  Precautions Precautions: Fall, Other (comment) Precaution Comments: monitor vitals Restrictions Weight Bearing Restrictions: No   Therapy/Group: Individual Therapy Cruzita Lederer Nakeia Calvi, PT, DPT  08/08/2021, 7:46 AM

## 2021-08-08 NOTE — Progress Notes (Addendum)
Occupational Therapy Session Note  Patient Details  Name: George Bradley MRN: [id number] Date of Birth: Jan 13, 1941  Today's Date: 08/08/2021 OT Individual Time: 0800-0903 OT Individual Time Calculation (min): 63 min    Short Term Goals: Week 1:  OT Short Term Goal 1 (Week 1): STGs = LTGs  Skilled Therapeutic Interventions/Progress Updates:  Pt greeted supine in bed eager to work on bathing and dressing. Session focus on BADL reeducation and ADL transfers. Total A to don thigh high TEDS from bed level. Pt completed bed mobility with CGA. Total A to don ABD binder from EOB. Pt with no reprots of dizziness once EOB. CGA stand from EOB with RW. pt completed ambulatory transfer from EOB>sink for seated bathign tasks with CGA for RW, pt opted to only wash face stating he would rather wait until tomorrow to get into his own shower, supervision for oral care at sink .  Pt completed UB dressing with set- up assist and MIN A fro LB dressing d/t pain in LLE. When pt is pain free pt can reach to LB to don pants and shoes with CGA. After donning shoes from sitting pt reports feeling dizzy, BP 80/59  with teds and ABD binder donned. Issued pt some water and pt completed seated LB therex for 1 min with vitals checked again 77/55. Elevated pts legs and alerted RN.  Stand pivot transer back to bed with rw and MIN A fro safety, CGA fro sit>supine. BP checked in supine 111/ 65. Rn enter at this point to take over.  2 more BP checked from supine, 97/59, 1 min later 108/58.  pt left supine in bed with bed alarm activated and all need within reach.                    Therapy Documentation Precautions:  Precautions Precautions: Fall, Other (comment) Precaution Comments: monitor vitals Restrictions Weight Bearing Restrictions: No  Pain: unrated pain in LLE, offered rest breaks and repositioning as needed.     Therapy/Group: Individual Therapy  Corinne Ports [facility] 08/08/2021, 9:17 AM

## 2021-08-08 NOTE — Progress Notes (Signed)
Occupational Therapy Discharge Summary  Patient Details  Name: George Bradley MRN: [id number] Date of Birth: 1941/06/08  Today's Date: 08/08/2021     Patient has met 7 of 8 long term goals due to improved activity tolerance and ability to compensate for deficits.  Patient to discharge at overall  CGA  level.  Patient's care partner is independent to provide the necessary physical and cognitive assistance at discharge.    Reasons goals not met: Independent goal set for sit balance but due to low blood pressure, pt needs supervision.    Recommendation:  Patient will benefit from ongoing skilled OT services in home health setting to continue to advance functional skills in the area of BADL.  Equipment: No equipment provided  Reasons for discharge: treatment goals met  Patient/family agrees with progress made and goals achieved: Yes  OT Discharge Precautions/Restrictions  Precautions Precautions: Fall;Other (comment) Precaution Comments: monitor vitals Restrictions Weight Bearing Restrictions: No    Vital Signs Therapy Vitals Temp: 97.9 F (36.6 C) Temp Source: Oral Pulse Rate: 61 Resp: 18 BP: (!) 147/84 Patient Position (if appropriate): Lying Oxygen Therapy SpO2: 96 % O2 Device: Room Air ADL ADL Eating: Supervision/safety Grooming: Supervision/safety Where Assessed-Grooming: Sitting at sink Upper Body Bathing: Setup Where Assessed-Upper Body Bathing: Sitting at sink Lower Body Bathing: Contact guard (simulated) Where Assessed-Lower Body Bathing: Sitting at sink Upper Body Dressing: Setup Where Assessed-Upper Body Dressing: Wheelchair Lower Body Dressing: Contact guard Where Assessed-Lower Body Dressing: Wheelchair Toileting: Moderate assistance Toilet Transfer: Minimal assistance Toilet Transfer Method: Squat pivot Vision Baseline Vision/History: 1 Wears glasses Patient Visual Report: No change from baseline Vision Assessment?: No apparent visual  deficits Perception  Perception: Within Functional Limits Praxis Praxis: Intact Cognition Overall Cognitive Status: History of cognitive impairments - at baseline Arousal/Alertness: Awake/alert Orientation Level: Oriented X4 Year: 2022 Month: November Day of Week: Correct Immediate Memory Recall: Sock;Blue;Bed Memory Recall Sock: Without Cue Memory Recall Blue: With Cue Memory Recall Bed: Without Cue Awareness: Impaired Awareness Impairment: Anticipatory impairment Problem Solving: Impaired Problem Solving Impairment: Functional complex Executive Function: Reasoning;Self Monitoring Reasoning: Impaired Reasoning Impairment: Functional complex Self Monitoring: Impaired Self Monitoring Impairment: Functional complex Safety/Judgment: Impaired Comments: Dementia at baseline Sensation Sensation Light Touch: Appears Intact Coordination Gross Motor Movements are Fluid and Coordinated: No Fine Motor Movements are Fluid and Coordinated: Yes Coordination and Movement Description: Posterior lean, affected by orthostais Motor  Motor Motor: Hemiplegia;Abnormal postural alignment and control Motor - Discharge Observations: posterior lean, limited hip flexion tolerance due to pain. L sided hemiplegia Mobility  Bed Mobility Bed Mobility: Supine to Sit;Sit to Supine Supine to Sit: Contact Guard/Touching assist Sit to Supine: Contact Guard/Touching assist Transfers Sit to Stand: Contact Guard/Touching assist Stand to Sit: Contact Guard/Touching assist  Trunk/Postural Assessment  Cervical Assessment Cervical Assessment: Exceptions to [facility] (forward head) Thoracic Assessment Thoracic Assessment: Exceptions to [facility] (kyphotic) Lumbar Assessment Lumbar Assessment: Exceptions to [facility] (posterior pelvic tilt) Postural Control Postural Control: Within Functional Limits Trunk Control: able to sit EOB with BUE support during ADLs with supervision  Balance Static Sitting Balance Static Sitting  - Balance Support: Feet supported;No upper extremity supported Static Sitting - Level of Assistance: 7: Independent Dynamic Sitting Balance Dynamic Sitting - Balance Support: Feet supported;During functional activity Dynamic Sitting - Level of Assistance: 5: Stand by assistance Dynamic Sitting - Balance Activities: Reaching for objects;Reaching across midline;Lateral lean/weight shifting;Forward lean/weight shifting Sitting balance - Comments: CGA for safety d/t BP issues Static Standing Balance Static Standing - Balance Support: During functional  activity;Bilateral upper extremity supported Static Standing - Level of Assistance: 6: Modified independent (Device/Increase time) Dynamic Standing Balance Dynamic Standing - Balance Support: During functional activity;Bilateral upper extremity supported Dynamic Standing - Level of Assistance: 5: Stand by assistance Dynamic Standing - Balance Activities: Reaching across midline;Reaching for objects Dynamic Standing - Comments: CGA for safety during ADLs Extremity/Trunk Assessment RUE Assessment RUE Assessment: Within Functional Limits LUE Assessment LUE Assessment: Within Functional Limits   [name] 08/08/2021, 8:30 AM

## 2021-08-08 NOTE — Progress Notes (Signed)
PROGRESS NOTE   Subjective/Complaints:  Looking forward to getting dressed this am , no c/o  ROS:  Pt denies SOB, abd pain, CP, N/V/C/D (+) peripheral neuropathy  Objective:   No results found. No results for input(s): WBC, HGB, HCT, PLT in the last 72 hours.  Recent Labs    08/06/21 2343  CREATININE 1.15     Intake/Output Summary (Last 24 hours) at 08/08/2021 0711 Last data filed at 08/08/2021 7096 Gross per 24 hour  Intake 2210 ml  Output 1400 ml  Net 810 ml      Pressure Injury 07/24/21 Sacrum Mid Stage 1 -  Intact skin with non-blanchable redness of a localized area usually over a bony prominence. (Active)  07/24/21 0450  Location: Sacrum  Location Orientation: Mid  Staging: Stage 1 -  Intact skin with non-blanchable redness of a localized area usually over a bony prominence.  Wound Description (Comments):   Present on Admission: Yes    Physical Exam: Vital Signs Blood pressure (!) 147/84, pulse 61, temperature 97.9 F (36.6 C), temperature source Oral, resp. rate 18, SpO2 96 %.  General: No acute distress Mood and affect are appropriate Heart: Regular rate and rhythm no rubs murmurs or extra sounds Lungs: Clear to auscultation, breathing unlabored, no rales or wheezes Abdomen: Positive bowel sounds, soft nontender to palpation, nondistended Extremities: No clubbing, cyanosis, or edema Skin: No evidence of breakdown, no evidence of rash    MSK- no ecchymosis or tenderness over L lateral hip,no pain with active hip flexion , add, abduction  Neurological: alert Neurological:     Comments: Patient is alert.   Follows simple commands.  Provides name and age.  remembers me as MD who has seen him last 3 d  LLE 4/5, RLE 5/5 upper extremities 5/5 strength   Assessment/Plan: 1. Functional deficits which require 3+ hours per day of interdisciplinary therapy in a comprehensive inpatient rehab  setting. Physiatrist is providing close team supervision and 24 hour management of active medical problems listed below. Physiatrist and rehab team continue to assess barriers to discharge/monitor patient progress toward functional and medical goals  Care Tool:  Bathing    Body parts bathed by patient: Left upper leg, Right upper leg   Body parts bathed by helper: Right lower leg, Left lower leg     Bathing assist Assist Level: Minimal Assistance - Patient > 75%     Upper Body Dressing/Undressing Upper body dressing   What is the patient wearing?: Pull over shirt    Upper body assist Assist Level: Supervision/Verbal cueing    Lower Body Dressing/Undressing Lower body dressing      What is the patient wearing?: Pants     Lower body assist Assist for lower body dressing: Maximal Assistance - Patient 25 - 49%     Toileting Toileting    Toileting assist Assist for toileting: Contact Guard/Touching assist     Transfers Chair/bed transfer  Transfers assist     Chair/bed transfer assist level: Contact Guard/Touching assist Chair/bed transfer assistive device: Programmer, multimedia   Ambulation assist      Assist level: Contact Guard/Touching assist Assistive device: Walker-rolling Max distance: 110ft  Walk 10 feet activity   Assist  Walk 10 feet activity did not occur: Safety/medical concerns (Fatigue, pain)  Assist level: Contact Guard/Touching assist Assistive device: Walker-rolling   Walk 50 feet activity   Assist Walk 50 feet with 2 turns activity did not occur: Safety/medical concerns (Fatigue, pain)  Assist level: Contact Guard/Touching assist Assistive device: Walker-rolling    Walk 150 feet activity   Assist Walk 150 feet activity did not occur: Safety/medical concerns (Fatigue, pain)  Assist level: Contact Guard/Touching assist Assistive device: Walker-rolling    Walk 10 feet on uneven surface  activity   Assist  Walk 10 feet on uneven surfaces activity did not occur: Safety/medical concerns (Fatigue, pain)         Wheelchair     Assist Is the patient using a wheelchair?: Yes Type of Wheelchair: Manual    Wheelchair assist level: Supervision/Verbal cueing Max wheelchair distance: 23'    Wheelchair 50 feet with 2 turns activity    Assist        Assist Level: Supervision/Verbal cueing   Wheelchair 150 feet activity     Assist      Assist Level: Moderate Assistance - Patient 50 - 74%   Blood pressure (!) 147/84, pulse 61, temperature 97.9 F (36.6 C), temperature source Oral, resp. rate 18, SpO2 96 %.    Medical Problem List and Plan: 1.  Debility secondary to SIRS/sepsis.  Completing course of Maxipime             -patient may shower             -ELOS/Goals: plan d/c 11/18             con't PT and OT/CIR   2.  Antithrombotics: -DVT/anticoagulation: Lovenox             -antiplatelet therapy: Aspirin 81 mg daily and Brilinta 90 mg twice daily 3. Chemotherapy induced peripheral neuropathy: Increase Neurontin to 1,000 mg 3 times daily oxycodone as needed. Cymbalta 20mg  daily prescribed. Hip pain on Left likely trochanteric bursitis, xray neg , corticosteroid injection was of benefit , , also on flector patch which can be continued at d/c if needed  4. Anxiety: Aricept 5 mg nightly, continue Ativan 1 mg 3 times daily             -antipsychotic agents: N/A 5. Neuropsych: This patient is capable of making decisions on his own behalf. 6. Skin/Wound Care: Routine skin checks 7. Fluids/Electrolytes/Nutrition: Routine in and outs with follow-up chemistries 8.  CHRONIC Orthostasis.  Florinef 0.1 mg daily.  Monitor with increased mobility  11/12- had episode where BP was OK, however HR dropped to 30s- thought to be vasovagal- as per below #14- cont Abd binder and thigh high TEDs  9.  History of CVA with right-sided carotid occlusion.  Continue aspirin and Brilinta 10.   Hypothyroidism.  Synthroid 11.  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  Follow-up outpatient 12.  H/o of hyperlipidemia.  LDL 20, HDL 45, VLDL 9, total cholesterol 91, TG 47. No current evidence of hyperlipidemia so will d/c Crestor 13. Recurrent aspiration pneumonias: patient prefers to consume oral food and defers PEG, discussed with patient and SLP Hx radiation induced esophageal stricture, hx tongue Ca, may have pharyngeal dysphagia as a result as well  14. Vasovagal episode  11/12- occurred overnight- HR to 30s- rapid response called- question if passed out- pt  said no; staff said yes- per note, was staring blankly- will con't to monitor- has chronic orthstasis, so could have played a role-  16.  Myasthenia gravis - Cont mestinon, follows with Dr Felecia Shelling   Left trochanteric bursa injection performed, 11/15 LOS: 8 days A FACE TO FACE EVALUATION WAS PERFORMED  Charlett Blake 08/08/2021, 7:11 AM

## 2021-08-08 NOTE — Progress Notes (Signed)
Physical Therapy Session Note  Patient Details  Name: George Bradley MRN: [id number] Date of Birth: 1941/04/23  Today's Date: 08/08/2021 PT Individual Time: 5056-9794 PT Individual Time Calculation (min): 41 min  and Today's Date: 08/08/2021 PT Missed Time: 19 Minutes Missed Time Reason: Patient fatigue;Patient unwilling to participate  Short Term Goals: Week 1:  PT Short Term Goal 1 (Week 1): STG = LTG due to ELOS  Skilled Therapeutic Interventions/Progress Updates:  Pt received supine in bed, requested to use toilet and declined pain at rest. Pt performed supine <>sit mod I w/bedrail and sit <>stand w/S* from EOB w/good hand placement. Pt ambulated 15' to bathroom w/RW and S*, noted kyphotic posture and decreased step clearance but good management of AD around turns and obstacles. Pt doffed pants w/S*, voided continently and performed sit <>stand from [facility] w/S*. Pt donned pants w/S* and ambulated 20' to recliner w/RW and S*. Lengthy discussion regarding what to expect after DC and encouragement to continue performing exercises at home, pt verbalized understanding. Pt performed sit <>stand pivot from recliner to [facility] w/S* and RW and self-propelled 75' w/BUEs and S* prior to reaching fatigue and requesting therapist push him, despite heavy encouragement for him to continue to try to self-propel. Transported pt to 4N hallway for sunlight and improved mood and then returned pt to room w/total A 2/2 fatigue. Pt performed sit <>stand pivot to recliner w/S* and RW. Pt declined to continue session due to "checking all of his boxes" and "being done". Pt was left seated in recliner in room, all needs in reach. Missed 19 minutes of skilled PT due to pt refusal.   Therapy Documentation Precautions:  Precautions Precautions: Fall, Other (comment) Precaution Comments: monitor vitals Restrictions Weight Bearing Restrictions: No   Therapy/Group: Individual Therapy Cruzita Lederer Valeree Leidy, PT, DPT  08/08/2021,  3:43 PM

## 2021-08-08 NOTE — Progress Notes (Signed)
Inpatient Rehabilitation Discharge Medication Review by a Pharmacist  A complete drug regimen review was completed for this patient to identify any potential clinically significant medication issues.  High Risk Drug Classes Is patient taking? Indication by Medication  Antipsychotic No   Anticoagulant No   Antibiotic No   Opioid Yes Oxycodone for pain with h/o peripheral neuropathy, bursitis, and tongue cancer  Antiplatelet Yes ASA and Brilinta s/p CVA  Hypoglycemics/insulin No   Vasoactive Medication No   Chemotherapy No   Other No      Type of Medication Issue Identified Description of Issue Recommendation(s)  Drug Interaction(s) (clinically significant)     Duplicate Therapy     Allergy     No Medication Administration End Date     Incorrect Dose     Additional Drug Therapy Needed     Significant med changes from prior encounter (inform family/care partners about these prior to discharge). Other home meds Repatha, Norvasc, Indomethacin, and Tramadol d/c'd after inpatient stay.   Other       Clinically significant medication issues were identified that warrant physician communication and completion of prescribed/recommended actions by midnight of the next day:  No  Time spent performing this drug regimen review (minutes):  33min  Kendrix Orman S. Alford Highland, PharmD, BCPS Clinical Staff Pharmacist [email or web address] [place] 08/08/2021 12:01 PM

## 2021-08-08 NOTE — Progress Notes (Signed)
Patient ID: George Bradley, male   DOB: 1941-02-13, 80 y.o.   MRN: [id number] Pt has now decided to put on hold Hospice services he needs a few weeks to think about this. Wife is on board. Asked about the rollator both wants to wait on this. Pt is not sure he will use it at home. Have left a message with Cassandra at [facility]

## 2021-08-08 NOTE — Progress Notes (Signed)
Inpatient Rehabilitation Care Coordinator Discharge Note   Patient Details  Name: George Bradley MRN: [id number] Date of Birth: 03-May-1941   Discharge location: HOME WITH WIFE WHO CAN PROVIDE 24/7 CARE  Length of Stay:  9 DAYS  Discharge activity level: Cecilia  Home/community participation: ACTIVE  Patient response YD:SWVTVN Literacy - How often do you need to have someone help you when you read instructions, pamphlets, or other written material from your doctor or pharmacy?: Always  Patient response RW:CHJSCB Isolation - How often do you feel lonely or isolated from those around you?: Never  Services provided included: MD, RD, PT, OT, RN, CM, Pharmacy, SW  Financial Services:  Financial Services Utilized: Medicare    Choices offered to/list presented to: PT AND WIFE  Follow-up services arranged:  Other (Comment), Patient/Family request agency HH/DME        HH/DME Requested Agency: [facility] ROLLATOR FOR PT NOW ON HOLE DUE TO PT WANTING A WEEK TO DECIDE IF WANTS THIS, THEY ARE AWARE. WIFE TO CONTACT WHEN DECIDES AND CASSANDRA-HOSPICE TO REACH OUT TO DISCUSS PALLIATIVE CARE  Patient response to transportation need: Is the patient able to respond to transportation needs?: Yes In the past 12 months, has lack of transportation kept you from medical appointments or from getting medications?: No In the past 12 months, has lack of transportation kept you from meetings, work, or from getting things needed for daily living?: No    Comments (or additional information):WIFE WAS HERE DAILY AND PARTICIPATED IN [place]. AWARE NEEDS TO TAKE TIME FOR HERSELF AND GIVE HERSELF A BREAK. PT CAN BE VERY DEMANDING AND GRUFF  Patient/Family verbalized understanding of follow-up arrangements:  Yes  Individual responsible for coordination of the follow-up plan: CLAUDIA-WIFE [phone]  Confirmed correct DME delivered:  Elease Hashimoto 08/08/2021    Karene Bracken, Gardiner Rhyme

## 2021-08-08 NOTE — Plan of Care (Signed)
  Problem: RH Car Transfers Goal: LTG Patient will perform car transfers with assist (PT) Description: LTG: Patient will perform car transfers with assistance (PT). Outcome: Adequate for Discharge Note: Pt requires CGA for car transfers due to not remembering sequence for entering/exiting car and adhering to posterior hip precautions    Problem: RH Wheelchair Mobility Goal: LTG Patient will propel w/c in controlled environment (PT) Description: LTG: Patient will propel wheelchair in controlled environment, # of feet with assist (PT) Outcome: Adequate for Discharge Note: Pt propelling up to 95' w/S*    Problem: RH Stairs Goal: LTG Patient will ambulate up and down stairs w/assist (PT) Description: LTG: Patient will ambulate up and down # of stairs with assistance (PT) Outcome: Adequate for Discharge Note: Pt requires min A due to posterior lean and orthostasis

## 2021-08-09 MED ORDER — LORAZEPAM 0.5 MG PO TABS: 0.5000 mg | ORAL_TABLET | Freq: Two times a day (BID) | ORAL | Status: DC

## 2021-08-09 NOTE — Progress Notes (Signed)
PROGRESS NOTE   Subjective/Complaints: No new complaints this morning Neuropathy is under better control.  D/c home today Messaged April to schedule hospital follow-up for [facility]  ROS:  Pt denies SOB, abd pain, CP, N/V/C/D (+) peripheral neuropathy  Objective:   No results found. No results for input(s): WBC, HGB, HCT, PLT in the last 72 hours.  Recent Labs    08/06/21 2343  CREATININE 1.15    Intake/Output Summary (Last 24 hours) at 08/09/2021 1042 Last data filed at 08/09/2021 0748 Gross per 24 hour  Intake 750 ml  Output 1000 ml  Net -250 ml     Pressure Injury 07/24/21 Sacrum Mid Stage 1 -  Intact skin with non-blanchable redness of a localized area usually over a bony prominence. (Active)  07/24/21 0450  Location: Sacrum  Location Orientation: Mid  Staging: Stage 1 -  Intact skin with non-blanchable redness of a localized area usually over a bony prominence.  Wound Description (Comments):   Present on Admission: Yes    Physical Exam: Vital Signs Blood pressure 130/75, pulse (!) 59, temperature 98 F (36.7 C), resp. rate 14, SpO2 94 %.  General: No acute distress Mood and affect are appropriate Heart: Bradycardia Lungs: Clear to auscultation, breathing unlabored, no rales or wheezes Abdomen: Positive bowel sounds, soft nontender to palpation, nondistended Extremities: No clubbing, cyanosis, or edema Skin: No evidence of breakdown, no evidence of rash MSK- no ecchymosis or tenderness over L lateral hip,no pain with active hip flexion , add, abduction  Neurological: alert Neurological:     Comments: Patient is alert.   Follows simple commands.  Provides name and age.  remembers me as MD who has seen him last 3 d  LLE 4/5, RLE 5/5 upper extremities 5/5 strength   Assessment/Plan: 1. Functional deficits which require 3+ hours per day of interdisciplinary therapy in a comprehensive inpatient rehab  setting. Physiatrist is providing close team supervision and 24 hour management of active medical problems listed below. Physiatrist and rehab team continue to assess barriers to discharge/monitor patient progress toward functional and medical goals  Care Tool:  Bathing    Body parts bathed by patient: Right arm, Left arm, Chest, Abdomen, Front perineal area, Buttocks, Right upper leg, Left upper leg, Right lower leg, Left lower leg, Face   Body parts bathed by helper: Right lower leg, Left lower leg     Bathing assist Assist Level: Contact Guard/Touching assist (simulated)     Upper Body Dressing/Undressing Upper body dressing   What is the patient wearing?: Pull over shirt    Upper body assist Assist Level: Set up assist    Lower Body Dressing/Undressing Lower body dressing      What is the patient wearing?: Pants     Lower body assist Assist for lower body dressing: Contact Guard/Touching assist     Toileting Toileting    Toileting assist Assist for toileting: Contact Guard/Touching assist (simulated)     Transfers Chair/bed transfer  Transfers assist     Chair/bed transfer assist level: Supervision/Verbal cueing Chair/bed transfer assistive device: Programmer, multimedia   Ambulation assist      Assist level: Supervision/Verbal cueing Assistive  device: Walker-rolling Max distance: 150'   Walk 10 feet activity   Assist  Walk 10 feet activity did not occur: Safety/medical concerns (Fatigue, pain)  Assist level: Supervision/Verbal cueing Assistive device: Walker-rolling   Walk 50 feet activity   Assist Walk 50 feet with 2 turns activity did not occur: Safety/medical concerns (Fatigue, pain)  Assist level: Supervision/Verbal cueing Assistive device: Walker-rolling    Walk 150 feet activity   Assist Walk 150 feet activity did not occur: Safety/medical concerns (Fatigue, pain)  Assist level: Supervision/Verbal cueing Assistive  device: Walker-rolling    Walk 10 feet on uneven surface  activity   Assist Walk 10 feet on uneven surfaces activity did not occur: Safety/medical concerns (fatigue, pain, orthostasis)         Wheelchair     Assist Is the patient using a wheelchair?: Yes Type of Wheelchair: Manual    Wheelchair assist level: Supervision/Verbal cueing Max wheelchair distance: 30'    Wheelchair 50 feet with 2 turns activity    Assist        Assist Level: Supervision/Verbal cueing   Wheelchair 150 feet activity     Assist      Assist Level: Moderate Assistance - Patient 50 - 74%   Blood pressure 130/75, pulse (!) 59, temperature 98 F (36.7 C), resp. rate 14, SpO2 94 %.    Medical Problem List and Plan: 1.  Debility secondary to SIRS/sepsis.  Completing course of Maxipime             -patient may shower             -ELOS/Goals: plan d/c 11/18             Continue PT and OT/CIR 2.  Antithrombotics: -DVT/anticoagulation: Lovenox             -antiplatelet therapy: Aspirin 81 mg daily and Brilinta 90 mg twice daily 3. Chemotherapy induced peripheral neuropathy: Increase Neurontin to 1,000 mg 3 times daily oxycodone as needed. Cymbalta 20mg  daily prescribed. Will schedule for outpatient [facility]  Hip pain on Left likely trochanteric bursitis, xray neg , corticosteroid injection was of benefit , , also on flector patch which can be continued at d/c if needed  4. Anxiety: Aricept 5 mg nightly, continue Ativan 1 mg 3 times daily             -antipsychotic agents: N/A 5. Neuropsych: This patient is capable of making decisions on his own behalf. 6. Skin/Wound Care: Routine skin checks 7. Fluids/Electrolytes/Nutrition: Routine in and outs with follow-up chemistries 8.  CHRONIC Orthostasis.  Continue Florinef 0.1 mg daily.  Monitor with increased mobility  11/12- had episode where BP was OK, however HR dropped to 30s- thought to be vasovagal- as per below #14- cont Abd binder and  thigh high TEDs  9.  History of CVA with right-sided carotid occlusion.  Continue aspirin and Brilinta 10.  Hypothyroidism.  Synthroid 11.  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  Follow-up outpatient 12.  H/o of hyperlipidemia.  LDL 20, HDL 45, VLDL 9, total cholesterol 91, TG 47. No current evidence of hyperlipidemia so will d/c Crestor 13. Recurrent aspiration pneumonias: patient prefers to consume oral food and defers PEG, discussed with patient and SLP Hx radiation induced esophageal stricture, hx tongue Ca, may have pharyngeal dysphagia as a result as well  14. Vasovagal episode  11/12- occurred overnight- HR to 30s- rapid  response called- question if passed out- pt said no; staff said yes- per note, was staring blankly- will con't to monitor- has chronic orthstasis, so could have played a role-  16.  Myasthenia gravis - Cont mestinon, follows with Dr Felecia Shelling 17. Bradycardia: decrease Ativan to BID   >30 minutes spent in discharge of patient including review of medications and follow-up appointments, physical examination, and in answering all patient's questions    Left trochanteric bursa injection performed, 11/15 LOS: 9 days A FACE TO [place] 08/09/2021, 10:42 AM

## 2021-08-09 NOTE — Progress Notes (Signed)
INPATIENT REHABILITATION DISCHARGE NOTE   Discharge instructions by: Linna Hoff, PA  Verbalized understanding: by patient and wife  Skin care/Wound care healing? Skin intact   Pain: none  IV's: none  Tubes/Drains: none  O2: none  Safety instructions: fall risk  Patient belongings: sent home with wife  Discharged to: home with wife  Discharged via:   Notes:

## 2021-08-09 NOTE — Plan of Care (Signed)
  Problem: Sit to Stand Goal: LTG:  Patient will perform sit to stand with assistance level (PT) Description: LTG:  Patient will perform sit to stand with assistance level (PT) Outcome: Completed/Met

## 2021-08-12 ENCOUNTER — Emergency Department (HOSPITAL_COMMUNITY): Payer: Medicare Other

## 2021-08-12 ENCOUNTER — Encounter (HOSPITAL_COMMUNITY): Payer: Self-pay

## 2021-08-12 ENCOUNTER — Emergency Department (HOSPITAL_COMMUNITY)
Admission: EM | Admit: 2021-08-12 | Discharge: 2021-08-12 | Disposition: A | Discharge status: Home / Self Care | Payer: Medicare Other | Attending: Emergency Medicine | Admitting: Emergency Medicine

## 2021-08-12 ENCOUNTER — Other Ambulatory Visit: Payer: Self-pay | Admitting: *Deleted

## 2021-08-12 ENCOUNTER — Other Ambulatory Visit: Payer: Self-pay

## 2021-08-12 DIAGNOSIS — I2510 Atherosclerotic heart disease of native coronary artery without angina pectoris: Secondary | ICD-10-CM | POA: Diagnosis not present

## 2021-08-12 DIAGNOSIS — I951 Orthostatic hypotension: Secondary | ICD-10-CM | POA: Diagnosis not present

## 2021-08-12 DIAGNOSIS — R0902 Hypoxemia: Secondary | ICD-10-CM | POA: Diagnosis not present

## 2021-08-12 DIAGNOSIS — Z79899 Other long term (current) drug therapy: Secondary | ICD-10-CM | POA: Insufficient documentation

## 2021-08-12 DIAGNOSIS — Z8673 Personal history of transient ischemic attack (TIA), and cerebral infarction without residual deficits: Secondary | ICD-10-CM | POA: Diagnosis not present

## 2021-08-12 DIAGNOSIS — R1310 Dysphagia, unspecified: Secondary | ICD-10-CM | POA: Diagnosis not present

## 2021-08-12 DIAGNOSIS — Z20822 Contact with and (suspected) exposure to covid-19: Secondary | ICD-10-CM | POA: Diagnosis not present

## 2021-08-12 DIAGNOSIS — N189 Chronic kidney disease, unspecified: Secondary | ICD-10-CM | POA: Diagnosis not present

## 2021-08-12 DIAGNOSIS — Z951 Presence of aortocoronary bypass graft: Secondary | ICD-10-CM | POA: Insufficient documentation

## 2021-08-12 DIAGNOSIS — Z85810 Personal history of malignant neoplasm of tongue: Secondary | ICD-10-CM | POA: Diagnosis not present

## 2021-08-12 DIAGNOSIS — E039 Hypothyroidism, unspecified: Secondary | ICD-10-CM | POA: Diagnosis not present

## 2021-08-12 DIAGNOSIS — R42 Dizziness and giddiness: Secondary | ICD-10-CM | POA: Diagnosis present

## 2021-08-12 DIAGNOSIS — I1310 Hypertensive heart and chronic kidney disease without heart failure, with stage 1 through stage 4 chronic kidney disease, or unspecified chronic kidney disease: Secondary | ICD-10-CM | POA: Diagnosis not present

## 2021-08-12 DIAGNOSIS — F0390 Unspecified dementia without behavioral disturbance: Secondary | ICD-10-CM | POA: Diagnosis not present

## 2021-08-12 DIAGNOSIS — Z87891 Personal history of nicotine dependence: Secondary | ICD-10-CM | POA: Diagnosis not present

## 2021-08-12 DIAGNOSIS — I672 Cerebral atherosclerosis: Secondary | ICD-10-CM | POA: Diagnosis not present

## 2021-08-12 DIAGNOSIS — Z7982 Long term (current) use of aspirin: Secondary | ICD-10-CM | POA: Insufficient documentation

## 2021-08-12 LAB — CBC WITH DIFFERENTIAL/PLATELET
Abs Immature Granulocytes: 0.04 10*3/uL (ref 0.00–0.07)
Basophils Absolute: 0 10*3/uL (ref 0.0–0.1)
Basophils Relative: 0 %
Eosinophils Absolute: 0.4 10*3/uL (ref 0.0–0.5)
Eosinophils Relative: 4 %
HCT: 36.3 % — ABNORMAL LOW (ref 39.0–52.0)
Hemoglobin: 11.6 g/dL — ABNORMAL LOW (ref 13.0–17.0)
Immature Granulocytes: 0 %
Lymphocytes Relative: 19 %
Lymphs Abs: 1.7 10*3/uL (ref 0.7–4.0)
MCH: 29.9 pg (ref 26.0–34.0)
MCHC: 32 g/dL (ref 30.0–36.0)
MCV: 93.6 fL (ref 80.0–100.0)
Monocytes Absolute: 0.8 10*3/uL (ref 0.1–1.0)
Monocytes Relative: 8 %
Neutro Abs: 6.2 10*3/uL (ref 1.7–7.7)
Neutrophils Relative %: 69 %
Platelets: 311 10*3/uL (ref 150–400)
RBC: 3.88 MIL/uL — ABNORMAL LOW (ref 4.22–5.81)
RDW: 16.8 % — ABNORMAL HIGH (ref 11.5–15.5)
WBC: 9.1 10*3/uL (ref 4.0–10.5)
nRBC: 0 % (ref 0.0–0.2)

## 2021-08-12 LAB — URINALYSIS, ROUTINE W REFLEX MICROSCOPIC
Bilirubin Urine: NEGATIVE
Glucose, UA: NEGATIVE mg/dL
Hgb urine dipstick: NEGATIVE
Ketones, ur: NEGATIVE mg/dL
Leukocytes,Ua: NEGATIVE
Nitrite: NEGATIVE
Protein, ur: NEGATIVE mg/dL
Specific Gravity, Urine: 1.014 (ref 1.005–1.030)
pH: 7 (ref 5.0–8.0)

## 2021-08-12 LAB — TROPONIN I (HIGH SENSITIVITY): Troponin I (High Sensitivity): 23 ng/L — ABNORMAL HIGH (ref <18)

## 2021-08-12 LAB — COMPREHENSIVE METABOLIC PANEL
ALT: 27 U/L (ref 0–44)
AST: 27 U/L (ref 15–41)
Albumin: 3 g/dL — ABNORMAL LOW (ref 3.5–5.0)
Alkaline Phosphatase: 42 U/L (ref 38–126)
Anion gap: 7 (ref 5–15)
BUN: 18 mg/dL (ref 8–23)
CO2: 30 mmol/L (ref 22–32)
Calcium: 8.9 mg/dL (ref 8.9–10.3)
Chloride: 102 mmol/L (ref 98–111)
Creatinine, Ser: 1.13 mg/dL (ref 0.61–1.24)
GFR, Estimated: >60 mL/min (ref >60)
Glucose, Bld: 104 mg/dL — ABNORMAL HIGH (ref 70–99)
Potassium: 4.7 mmol/L (ref 3.5–5.1)
Sodium: 139 mmol/L (ref 135–145)
Total Bilirubin: 0.7 mg/dL (ref 0.3–1.2)
Total Protein: 7 g/dL (ref 6.5–8.1)

## 2021-08-12 LAB — RESP PANEL BY RT-PCR (FLU A&B, COVID) ARPGX2
Influenza A by PCR: NEGATIVE
Influenza B by PCR: NEGATIVE
SARS Coronavirus 2 by RT PCR: NEGATIVE

## 2021-08-12 LAB — TSH: TSH: 1.854 u[IU]/mL (ref 0.350–4.500)

## 2021-08-12 LAB — CBG MONITORING, ED: Glucose-Capillary: 93 mg/dL (ref 70–99)

## 2021-08-12 MED ORDER — LACTATED RINGERS IV BOLUS
1000.0000 mL | Freq: Once | INTRAVENOUS | Status: AC
  Administered 2021-08-12: 1000 mL via INTRAVENOUS
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
## 2021-08-12 NOTE — ED Provider Notes (Signed)
Emergency Medicine Provider Triage Evaluation Note  George [facility] , a 80 y.o. male  was evaluated in triage.  Pt complains of Fatigue and weakness.  Patient was recently hospitalized for aspiration pneumonia, got out of [facility] re[facility] SIRS, was feeling bad on Saturday.  Hypotensive, hypoxic at home.  Talk to Dr. Virgina Jock, advised to go to the ED for additional evaluation.  Review of Systems  Positive: WEAKNESS Negative: CP  Physical Exam  BP 131/82 (BP Location: Right Arm)   Pulse 76   Temp 97.9 F (36.6 C)   Resp 18   SpO2 95%  Gen:   Awake, no distress   Resp:  Normal effort  MSK:   Moves extremities without difficulty  Other:  Murmur appreciated, decreased air movement but no wheezes auscultated  Medical Decision Making  Medically screening exam initiated at 4:24 PM.  Appropriate orders placed.  [facility] was informed that the remainder of the evaluation will be completed by another provider, this initial triage assessment does not replace that evaluation, and the importance of remaining in the ED until their evaluation is complete.  X-ray, shortness of breath labs.   Sherrill Raring, PA-C 08/12/21 1625    Daleen Bo, MD 08/12/21 [phone]

## 2021-08-12 NOTE — Patient Outreach (Signed)
[facility]) Care Management  08/12/2021  George Bradley 1941-01-01 [id number]   Notified that member was discharged from [facility].  Call placed to wife George Bradley, state member has not been doing well.  Continues to decline and show signs of ongoing pneumonia, low blood pressure and oxygen saturations in the low 80s.  She has reached out to [facility], member has transitioned to hospice.  Will close case at this time.  Valente David, [place], MSN [place] [phone]

## 2021-08-12 NOTE — Discharge Instructions (Addendum)
Your work-up was positive for orthostatic hypotension which is low blood pressure associated with positional changes like sitting up and standing.  Recommend continued oral rehydration and reconnection with hospice outpatient.

## 2021-08-12 NOTE — ED Triage Notes (Signed)
Patient here with ongoing low sats and hypotension with fatigue and weakness. Ambulation problems and had hospice visit this am. Dr. Virgina Jock advised patient to come to ED for further evaluation. Alert and oriented, denies pain

## 2021-08-13 DIAGNOSIS — E039 Hypothyroidism, unspecified: Secondary | ICD-10-CM | POA: Diagnosis not present

## 2021-08-13 DIAGNOSIS — R531 Weakness: Secondary | ICD-10-CM | POA: Diagnosis not present

## 2021-08-13 DIAGNOSIS — M1990 Unspecified osteoarthritis, unspecified site: Secondary | ICD-10-CM | POA: Diagnosis not present

## 2021-08-13 DIAGNOSIS — E43 Unspecified severe protein-calorie malnutrition: Secondary | ICD-10-CM | POA: Diagnosis not present

## 2021-08-13 DIAGNOSIS — N183 Chronic kidney disease, stage 3 unspecified: Secondary | ICD-10-CM | POA: Diagnosis not present

## 2021-08-13 DIAGNOSIS — E785 Hyperlipidemia, unspecified: Secondary | ICD-10-CM | POA: Diagnosis not present

## 2021-08-13 DIAGNOSIS — N400 Enlarged prostate without lower urinary tract symptoms: Secondary | ICD-10-CM | POA: Diagnosis not present

## 2021-08-13 DIAGNOSIS — I519 Heart disease, unspecified: Secondary | ICD-10-CM | POA: Diagnosis not present

## 2021-08-13 DIAGNOSIS — I700 Atherosclerosis of aorta: Secondary | ICD-10-CM | POA: Diagnosis not present

## 2021-08-13 DIAGNOSIS — R42 Dizziness and giddiness: Secondary | ICD-10-CM | POA: Diagnosis not present

## 2021-08-13 DIAGNOSIS — G4700 Insomnia, unspecified: Secondary | ICD-10-CM | POA: Diagnosis not present

## 2021-08-13 DIAGNOSIS — I429 Cardiomyopathy, unspecified: Secondary | ICD-10-CM | POA: Diagnosis not present

## 2021-08-13 DIAGNOSIS — K219 Gastro-esophageal reflux disease without esophagitis: Secondary | ICD-10-CM | POA: Diagnosis not present

## 2021-08-14 DIAGNOSIS — I519 Heart disease, unspecified: Secondary | ICD-10-CM | POA: Diagnosis not present

## 2021-08-14 DIAGNOSIS — N400 Enlarged prostate without lower urinary tract symptoms: Secondary | ICD-10-CM | POA: Diagnosis not present

## 2021-08-14 DIAGNOSIS — I700 Atherosclerosis of aorta: Secondary | ICD-10-CM | POA: Diagnosis not present

## 2021-08-14 DIAGNOSIS — N183 Chronic kidney disease, stage 3 unspecified: Secondary | ICD-10-CM | POA: Diagnosis not present

## 2021-08-14 DIAGNOSIS — E43 Unspecified severe protein-calorie malnutrition: Secondary | ICD-10-CM | POA: Diagnosis not present

## 2021-08-14 DIAGNOSIS — M1990 Unspecified osteoarthritis, unspecified site: Secondary | ICD-10-CM | POA: Diagnosis not present

## 2021-08-19 DIAGNOSIS — N183 Chronic kidney disease, stage 3 unspecified: Secondary | ICD-10-CM | POA: Diagnosis not present

## 2021-08-19 DIAGNOSIS — E43 Unspecified severe protein-calorie malnutrition: Secondary | ICD-10-CM | POA: Diagnosis not present

## 2021-08-19 DIAGNOSIS — I700 Atherosclerosis of aorta: Secondary | ICD-10-CM | POA: Diagnosis not present

## 2021-08-19 DIAGNOSIS — I519 Heart disease, unspecified: Secondary | ICD-10-CM | POA: Diagnosis not present

## 2021-08-19 DIAGNOSIS — N400 Enlarged prostate without lower urinary tract symptoms: Secondary | ICD-10-CM | POA: Diagnosis not present

## 2021-08-19 DIAGNOSIS — M1990 Unspecified osteoarthritis, unspecified site: Secondary | ICD-10-CM | POA: Diagnosis not present

## 2021-08-20 DIAGNOSIS — I519 Heart disease, unspecified: Secondary | ICD-10-CM | POA: Diagnosis not present

## 2021-08-20 DIAGNOSIS — N400 Enlarged prostate without lower urinary tract symptoms: Secondary | ICD-10-CM | POA: Diagnosis not present

## 2021-08-20 DIAGNOSIS — I700 Atherosclerosis of aorta: Secondary | ICD-10-CM | POA: Diagnosis not present

## 2021-08-20 DIAGNOSIS — N183 Chronic kidney disease, stage 3 unspecified: Secondary | ICD-10-CM | POA: Diagnosis not present

## 2021-08-20 DIAGNOSIS — M1990 Unspecified osteoarthritis, unspecified site: Secondary | ICD-10-CM | POA: Diagnosis not present

## 2021-08-20 DIAGNOSIS — E43 Unspecified severe protein-calorie malnutrition: Secondary | ICD-10-CM | POA: Diagnosis not present

## 2021-08-22 DIAGNOSIS — N183 Chronic kidney disease, stage 3 unspecified: Secondary | ICD-10-CM | POA: Diagnosis not present

## 2021-08-22 DIAGNOSIS — E785 Hyperlipidemia, unspecified: Secondary | ICD-10-CM | POA: Diagnosis not present

## 2021-08-22 DIAGNOSIS — I700 Atherosclerosis of aorta: Secondary | ICD-10-CM | POA: Diagnosis not present

## 2021-08-22 DIAGNOSIS — I429 Cardiomyopathy, unspecified: Secondary | ICD-10-CM | POA: Diagnosis not present

## 2021-08-22 DIAGNOSIS — R531 Weakness: Secondary | ICD-10-CM | POA: Diagnosis not present

## 2021-08-22 DIAGNOSIS — E43 Unspecified severe protein-calorie malnutrition: Secondary | ICD-10-CM | POA: Diagnosis not present

## 2021-08-22 DIAGNOSIS — M1990 Unspecified osteoarthritis, unspecified site: Secondary | ICD-10-CM | POA: Diagnosis not present

## 2021-08-22 DIAGNOSIS — I519 Heart disease, unspecified: Secondary | ICD-10-CM | POA: Diagnosis not present

## 2021-08-22 DIAGNOSIS — G4700 Insomnia, unspecified: Secondary | ICD-10-CM | POA: Diagnosis not present

## 2021-08-22 DIAGNOSIS — N400 Enlarged prostate without lower urinary tract symptoms: Secondary | ICD-10-CM | POA: Diagnosis not present

## 2021-08-22 DIAGNOSIS — E039 Hypothyroidism, unspecified: Secondary | ICD-10-CM | POA: Diagnosis not present

## 2021-08-22 DIAGNOSIS — K219 Gastro-esophageal reflux disease without esophagitis: Secondary | ICD-10-CM | POA: Diagnosis not present

## 2021-08-22 DIAGNOSIS — R42 Dizziness and giddiness: Secondary | ICD-10-CM | POA: Diagnosis not present

## 2021-08-26 ENCOUNTER — Ambulatory Visit: Payer: Medicare Other | Admitting: Cardiology

## 2021-08-27 ENCOUNTER — Encounter: Payer: Medicare Other | Admitting: Physical Medicine and Rehabilitation

## 2021-09-01 DIAGNOSIS — I519 Heart disease, unspecified: Secondary | ICD-10-CM | POA: Diagnosis not present

## 2021-09-01 DIAGNOSIS — I700 Atherosclerosis of aorta: Secondary | ICD-10-CM | POA: Diagnosis not present

## 2021-09-01 DIAGNOSIS — M1990 Unspecified osteoarthritis, unspecified site: Secondary | ICD-10-CM | POA: Diagnosis not present

## 2021-09-01 DIAGNOSIS — N183 Chronic kidney disease, stage 3 unspecified: Secondary | ICD-10-CM | POA: Diagnosis not present

## 2021-09-01 DIAGNOSIS — N400 Enlarged prostate without lower urinary tract symptoms: Secondary | ICD-10-CM | POA: Diagnosis not present

## 2021-09-01 DIAGNOSIS — E43 Unspecified severe protein-calorie malnutrition: Secondary | ICD-10-CM | POA: Diagnosis not present

## 2021-09-04 DIAGNOSIS — I700 Atherosclerosis of aorta: Secondary | ICD-10-CM | POA: Diagnosis not present

## 2021-09-04 DIAGNOSIS — N400 Enlarged prostate without lower urinary tract symptoms: Secondary | ICD-10-CM | POA: Diagnosis not present

## 2021-09-04 DIAGNOSIS — M1990 Unspecified osteoarthritis, unspecified site: Secondary | ICD-10-CM | POA: Diagnosis not present

## 2021-09-04 DIAGNOSIS — E43 Unspecified severe protein-calorie malnutrition: Secondary | ICD-10-CM | POA: Diagnosis not present

## 2021-09-04 DIAGNOSIS — N183 Chronic kidney disease, stage 3 unspecified: Secondary | ICD-10-CM | POA: Diagnosis not present

## 2021-09-04 DIAGNOSIS — I519 Heart disease, unspecified: Secondary | ICD-10-CM | POA: Diagnosis not present

## 2021-09-10 DIAGNOSIS — I700 Atherosclerosis of aorta: Secondary | ICD-10-CM | POA: Diagnosis not present

## 2021-09-10 DIAGNOSIS — I519 Heart disease, unspecified: Secondary | ICD-10-CM | POA: Diagnosis not present

## 2021-09-10 DIAGNOSIS — M1990 Unspecified osteoarthritis, unspecified site: Secondary | ICD-10-CM | POA: Diagnosis not present

## 2021-09-10 DIAGNOSIS — E43 Unspecified severe protein-calorie malnutrition: Secondary | ICD-10-CM | POA: Diagnosis not present

## 2021-09-10 DIAGNOSIS — N183 Chronic kidney disease, stage 3 unspecified: Secondary | ICD-10-CM | POA: Diagnosis not present

## 2021-09-10 DIAGNOSIS — N400 Enlarged prostate without lower urinary tract symptoms: Secondary | ICD-10-CM | POA: Diagnosis not present

## 2021-09-13 DIAGNOSIS — N400 Enlarged prostate without lower urinary tract symptoms: Secondary | ICD-10-CM | POA: Diagnosis not present

## 2021-09-13 DIAGNOSIS — M1990 Unspecified osteoarthritis, unspecified site: Secondary | ICD-10-CM | POA: Diagnosis not present

## 2021-09-13 DIAGNOSIS — E43 Unspecified severe protein-calorie malnutrition: Secondary | ICD-10-CM | POA: Diagnosis not present

## 2021-09-13 DIAGNOSIS — I700 Atherosclerosis of aorta: Secondary | ICD-10-CM | POA: Diagnosis not present

## 2021-09-13 DIAGNOSIS — I519 Heart disease, unspecified: Secondary | ICD-10-CM | POA: Diagnosis not present

## 2021-09-13 DIAGNOSIS — N183 Chronic kidney disease, stage 3 unspecified: Secondary | ICD-10-CM | POA: Diagnosis not present

## 2021-09-15 DIAGNOSIS — N183 Chronic kidney disease, stage 3 unspecified: Secondary | ICD-10-CM | POA: Diagnosis not present

## 2021-09-15 DIAGNOSIS — N400 Enlarged prostate without lower urinary tract symptoms: Secondary | ICD-10-CM | POA: Diagnosis not present

## 2021-09-15 DIAGNOSIS — I700 Atherosclerosis of aorta: Secondary | ICD-10-CM | POA: Diagnosis not present

## 2021-09-15 DIAGNOSIS — I519 Heart disease, unspecified: Secondary | ICD-10-CM | POA: Diagnosis not present

## 2021-09-15 DIAGNOSIS — M1990 Unspecified osteoarthritis, unspecified site: Secondary | ICD-10-CM | POA: Diagnosis not present

## 2021-09-15 DIAGNOSIS — E43 Unspecified severe protein-calorie malnutrition: Secondary | ICD-10-CM | POA: Diagnosis not present

## 2021-09-16 DIAGNOSIS — M1990 Unspecified osteoarthritis, unspecified site: Secondary | ICD-10-CM | POA: Diagnosis not present

## 2021-09-16 DIAGNOSIS — I519 Heart disease, unspecified: Secondary | ICD-10-CM | POA: Diagnosis not present

## 2021-09-16 DIAGNOSIS — E43 Unspecified severe protein-calorie malnutrition: Secondary | ICD-10-CM | POA: Diagnosis not present

## 2021-09-16 DIAGNOSIS — N400 Enlarged prostate without lower urinary tract symptoms: Secondary | ICD-10-CM | POA: Diagnosis not present

## 2021-09-16 DIAGNOSIS — N183 Chronic kidney disease, stage 3 unspecified: Secondary | ICD-10-CM | POA: Diagnosis not present

## 2021-09-16 DIAGNOSIS — I700 Atherosclerosis of aorta: Secondary | ICD-10-CM | POA: Diagnosis not present

## 2021-09-17 DIAGNOSIS — I519 Heart disease, unspecified: Secondary | ICD-10-CM | POA: Diagnosis not present

## 2021-09-17 DIAGNOSIS — M1990 Unspecified osteoarthritis, unspecified site: Secondary | ICD-10-CM | POA: Diagnosis not present

## 2021-09-17 DIAGNOSIS — N183 Chronic kidney disease, stage 3 unspecified: Secondary | ICD-10-CM | POA: Diagnosis not present

## 2021-09-17 DIAGNOSIS — I700 Atherosclerosis of aorta: Secondary | ICD-10-CM | POA: Diagnosis not present

## 2021-09-17 DIAGNOSIS — E43 Unspecified severe protein-calorie malnutrition: Secondary | ICD-10-CM | POA: Diagnosis not present

## 2021-09-17 DIAGNOSIS — N400 Enlarged prostate without lower urinary tract symptoms: Secondary | ICD-10-CM | POA: Diagnosis not present

## 2021-09-18 DIAGNOSIS — M1990 Unspecified osteoarthritis, unspecified site: Secondary | ICD-10-CM | POA: Diagnosis not present

## 2021-09-18 DIAGNOSIS — I700 Atherosclerosis of aorta: Secondary | ICD-10-CM | POA: Diagnosis not present

## 2021-09-18 DIAGNOSIS — I519 Heart disease, unspecified: Secondary | ICD-10-CM | POA: Diagnosis not present

## 2021-09-18 DIAGNOSIS — N400 Enlarged prostate without lower urinary tract symptoms: Secondary | ICD-10-CM | POA: Diagnosis not present

## 2021-09-18 DIAGNOSIS — N183 Chronic kidney disease, stage 3 unspecified: Secondary | ICD-10-CM | POA: Diagnosis not present

## 2021-09-18 DIAGNOSIS — E43 Unspecified severe protein-calorie malnutrition: Secondary | ICD-10-CM | POA: Diagnosis not present

## 2021-09-19 DIAGNOSIS — N183 Chronic kidney disease, stage 3 unspecified: Secondary | ICD-10-CM | POA: Diagnosis not present

## 2021-09-19 DIAGNOSIS — I700 Atherosclerosis of aorta: Secondary | ICD-10-CM | POA: Diagnosis not present

## 2021-09-19 DIAGNOSIS — N400 Enlarged prostate without lower urinary tract symptoms: Secondary | ICD-10-CM | POA: Diagnosis not present

## 2021-09-19 DIAGNOSIS — E43 Unspecified severe protein-calorie malnutrition: Secondary | ICD-10-CM | POA: Diagnosis not present

## 2021-09-19 DIAGNOSIS — I519 Heart disease, unspecified: Secondary | ICD-10-CM | POA: Diagnosis not present

## 2021-09-19 DIAGNOSIS — M1990 Unspecified osteoarthritis, unspecified site: Secondary | ICD-10-CM | POA: Diagnosis not present

## 2021-09-20 DIAGNOSIS — N400 Enlarged prostate without lower urinary tract symptoms: Secondary | ICD-10-CM | POA: Diagnosis not present

## 2021-09-20 DIAGNOSIS — N183 Chronic kidney disease, stage 3 unspecified: Secondary | ICD-10-CM | POA: Diagnosis not present

## 2021-09-20 DIAGNOSIS — I519 Heart disease, unspecified: Secondary | ICD-10-CM | POA: Diagnosis not present

## 2021-09-20 DIAGNOSIS — E43 Unspecified severe protein-calorie malnutrition: Secondary | ICD-10-CM | POA: Diagnosis not present

## 2021-09-20 DIAGNOSIS — M1990 Unspecified osteoarthritis, unspecified site: Secondary | ICD-10-CM | POA: Diagnosis not present

## 2021-09-20 DIAGNOSIS — I700 Atherosclerosis of aorta: Secondary | ICD-10-CM | POA: Diagnosis not present

## 2021-09-22 DIAGNOSIS — R42 Dizziness and giddiness: Secondary | ICD-10-CM | POA: Diagnosis not present

## 2021-09-22 DIAGNOSIS — E785 Hyperlipidemia, unspecified: Secondary | ICD-10-CM | POA: Diagnosis not present

## 2021-09-22 DIAGNOSIS — N400 Enlarged prostate without lower urinary tract symptoms: Secondary | ICD-10-CM | POA: Diagnosis not present

## 2021-09-22 DIAGNOSIS — E43 Unspecified severe protein-calorie malnutrition: Secondary | ICD-10-CM | POA: Diagnosis not present

## 2021-09-22 DIAGNOSIS — I429 Cardiomyopathy, unspecified: Secondary | ICD-10-CM | POA: Diagnosis not present

## 2021-09-22 DIAGNOSIS — G4700 Insomnia, unspecified: Secondary | ICD-10-CM | POA: Diagnosis not present

## 2021-09-22 DIAGNOSIS — I519 Heart disease, unspecified: Secondary | ICD-10-CM | POA: Diagnosis not present

## 2021-09-22 DIAGNOSIS — M1990 Unspecified osteoarthritis, unspecified site: Secondary | ICD-10-CM | POA: Diagnosis not present

## 2021-09-22 DIAGNOSIS — N183 Chronic kidney disease, stage 3 unspecified: Secondary | ICD-10-CM | POA: Diagnosis not present

## 2021-09-22 DIAGNOSIS — R531 Weakness: Secondary | ICD-10-CM | POA: Diagnosis not present

## 2021-09-22 DIAGNOSIS — E039 Hypothyroidism, unspecified: Secondary | ICD-10-CM | POA: Diagnosis not present

## 2021-09-22 DIAGNOSIS — I700 Atherosclerosis of aorta: Secondary | ICD-10-CM | POA: Diagnosis not present

## 2021-09-22 DIAGNOSIS — K219 Gastro-esophageal reflux disease without esophagitis: Secondary | ICD-10-CM | POA: Diagnosis not present

## 2021-09-24 ENCOUNTER — Ambulatory Visit: Payer: Medicare Other | Admitting: Cardiology

## 2021-09-24 DIAGNOSIS — N183 Chronic kidney disease, stage 3 unspecified: Secondary | ICD-10-CM | POA: Diagnosis not present

## 2021-09-24 DIAGNOSIS — M1990 Unspecified osteoarthritis, unspecified site: Secondary | ICD-10-CM | POA: Diagnosis not present

## 2021-09-24 DIAGNOSIS — I700 Atherosclerosis of aorta: Secondary | ICD-10-CM | POA: Diagnosis not present

## 2021-09-24 DIAGNOSIS — I519 Heart disease, unspecified: Secondary | ICD-10-CM | POA: Diagnosis not present

## 2021-09-24 DIAGNOSIS — E43 Unspecified severe protein-calorie malnutrition: Secondary | ICD-10-CM | POA: Diagnosis not present

## 2021-09-24 DIAGNOSIS — N400 Enlarged prostate without lower urinary tract symptoms: Secondary | ICD-10-CM | POA: Diagnosis not present

## 2021-09-27 DIAGNOSIS — M1712 Unilateral primary osteoarthritis, left knee: Secondary | ICD-10-CM | POA: Diagnosis not present

## 2021-09-27 DIAGNOSIS — M545 Low back pain, unspecified: Secondary | ICD-10-CM | POA: Diagnosis not present

## 2021-09-27 DIAGNOSIS — M5451 Vertebrogenic low back pain: Secondary | ICD-10-CM | POA: Diagnosis not present

## 2021-09-27 DIAGNOSIS — M25562 Pain in left knee: Secondary | ICD-10-CM | POA: Diagnosis not present

## 2021-10-01 DIAGNOSIS — M1990 Unspecified osteoarthritis, unspecified site: Secondary | ICD-10-CM | POA: Diagnosis not present

## 2021-10-01 DIAGNOSIS — I700 Atherosclerosis of aorta: Secondary | ICD-10-CM | POA: Diagnosis not present

## 2021-10-01 DIAGNOSIS — N183 Chronic kidney disease, stage 3 unspecified: Secondary | ICD-10-CM | POA: Diagnosis not present

## 2021-10-01 DIAGNOSIS — N400 Enlarged prostate without lower urinary tract symptoms: Secondary | ICD-10-CM | POA: Diagnosis not present

## 2021-10-01 DIAGNOSIS — I519 Heart disease, unspecified: Secondary | ICD-10-CM | POA: Diagnosis not present

## 2021-10-01 DIAGNOSIS — E43 Unspecified severe protein-calorie malnutrition: Secondary | ICD-10-CM | POA: Diagnosis not present

## 2021-10-02 ENCOUNTER — Ambulatory Visit: Payer: Medicare Other | Admitting: Neurology

## 2021-10-07 DIAGNOSIS — N400 Enlarged prostate without lower urinary tract symptoms: Secondary | ICD-10-CM | POA: Diagnosis not present

## 2021-10-07 DIAGNOSIS — N183 Chronic kidney disease, stage 3 unspecified: Secondary | ICD-10-CM | POA: Diagnosis not present

## 2021-10-07 DIAGNOSIS — M1990 Unspecified osteoarthritis, unspecified site: Secondary | ICD-10-CM | POA: Diagnosis not present

## 2021-10-07 DIAGNOSIS — I700 Atherosclerosis of aorta: Secondary | ICD-10-CM | POA: Diagnosis not present

## 2021-10-07 DIAGNOSIS — E43 Unspecified severe protein-calorie malnutrition: Secondary | ICD-10-CM | POA: Diagnosis not present

## 2021-10-07 DIAGNOSIS — I519 Heart disease, unspecified: Secondary | ICD-10-CM | POA: Diagnosis not present

## 2021-10-09 DIAGNOSIS — E43 Unspecified severe protein-calorie malnutrition: Secondary | ICD-10-CM | POA: Diagnosis not present

## 2021-10-09 DIAGNOSIS — N400 Enlarged prostate without lower urinary tract symptoms: Secondary | ICD-10-CM | POA: Diagnosis not present

## 2021-10-09 DIAGNOSIS — I519 Heart disease, unspecified: Secondary | ICD-10-CM | POA: Diagnosis not present

## 2021-10-09 DIAGNOSIS — N183 Chronic kidney disease, stage 3 unspecified: Secondary | ICD-10-CM | POA: Diagnosis not present

## 2021-10-09 DIAGNOSIS — I700 Atherosclerosis of aorta: Secondary | ICD-10-CM | POA: Diagnosis not present

## 2021-10-09 DIAGNOSIS — M1990 Unspecified osteoarthritis, unspecified site: Secondary | ICD-10-CM | POA: Diagnosis not present

## 2021-10-11 DIAGNOSIS — N400 Enlarged prostate without lower urinary tract symptoms: Secondary | ICD-10-CM | POA: Diagnosis not present

## 2021-10-11 DIAGNOSIS — E43 Unspecified severe protein-calorie malnutrition: Secondary | ICD-10-CM | POA: Diagnosis not present

## 2021-10-11 DIAGNOSIS — I700 Atherosclerosis of aorta: Secondary | ICD-10-CM | POA: Diagnosis not present

## 2021-10-11 DIAGNOSIS — N183 Chronic kidney disease, stage 3 unspecified: Secondary | ICD-10-CM | POA: Diagnosis not present

## 2021-10-11 DIAGNOSIS — M1990 Unspecified osteoarthritis, unspecified site: Secondary | ICD-10-CM | POA: Diagnosis not present

## 2021-10-11 DIAGNOSIS — I519 Heart disease, unspecified: Secondary | ICD-10-CM | POA: Diagnosis not present

## 2021-10-14 DIAGNOSIS — I700 Atherosclerosis of aorta: Secondary | ICD-10-CM | POA: Diagnosis not present

## 2021-10-14 DIAGNOSIS — I519 Heart disease, unspecified: Secondary | ICD-10-CM | POA: Diagnosis not present

## 2021-10-14 DIAGNOSIS — E43 Unspecified severe protein-calorie malnutrition: Secondary | ICD-10-CM | POA: Diagnosis not present

## 2021-10-14 DIAGNOSIS — N400 Enlarged prostate without lower urinary tract symptoms: Secondary | ICD-10-CM | POA: Diagnosis not present

## 2021-10-14 DIAGNOSIS — N183 Chronic kidney disease, stage 3 unspecified: Secondary | ICD-10-CM | POA: Diagnosis not present

## 2021-10-14 DIAGNOSIS — M1990 Unspecified osteoarthritis, unspecified site: Secondary | ICD-10-CM | POA: Diagnosis not present

## 2021-10-15 DIAGNOSIS — I700 Atherosclerosis of aorta: Secondary | ICD-10-CM | POA: Diagnosis not present

## 2021-10-15 DIAGNOSIS — N183 Chronic kidney disease, stage 3 unspecified: Secondary | ICD-10-CM | POA: Diagnosis not present

## 2021-10-15 DIAGNOSIS — I519 Heart disease, unspecified: Secondary | ICD-10-CM | POA: Diagnosis not present

## 2021-10-15 DIAGNOSIS — E43 Unspecified severe protein-calorie malnutrition: Secondary | ICD-10-CM | POA: Diagnosis not present

## 2021-10-15 DIAGNOSIS — N400 Enlarged prostate without lower urinary tract symptoms: Secondary | ICD-10-CM | POA: Diagnosis not present

## 2021-10-15 DIAGNOSIS — M1990 Unspecified osteoarthritis, unspecified site: Secondary | ICD-10-CM | POA: Diagnosis not present

## 2021-10-17 DIAGNOSIS — I519 Heart disease, unspecified: Secondary | ICD-10-CM | POA: Diagnosis not present

## 2021-10-17 DIAGNOSIS — N183 Chronic kidney disease, stage 3 unspecified: Secondary | ICD-10-CM | POA: Diagnosis not present

## 2021-10-17 DIAGNOSIS — M1990 Unspecified osteoarthritis, unspecified site: Secondary | ICD-10-CM | POA: Diagnosis not present

## 2021-10-17 DIAGNOSIS — N400 Enlarged prostate without lower urinary tract symptoms: Secondary | ICD-10-CM | POA: Diagnosis not present

## 2021-10-17 DIAGNOSIS — E43 Unspecified severe protein-calorie malnutrition: Secondary | ICD-10-CM | POA: Diagnosis not present

## 2021-10-17 DIAGNOSIS — I700 Atherosclerosis of aorta: Secondary | ICD-10-CM | POA: Diagnosis not present

## 2021-10-21 DIAGNOSIS — N400 Enlarged prostate without lower urinary tract symptoms: Secondary | ICD-10-CM | POA: Diagnosis not present

## 2021-10-21 DIAGNOSIS — I519 Heart disease, unspecified: Secondary | ICD-10-CM | POA: Diagnosis not present

## 2021-10-21 DIAGNOSIS — I700 Atherosclerosis of aorta: Secondary | ICD-10-CM | POA: Diagnosis not present

## 2021-10-21 DIAGNOSIS — M1990 Unspecified osteoarthritis, unspecified site: Secondary | ICD-10-CM | POA: Diagnosis not present

## 2021-10-21 DIAGNOSIS — E43 Unspecified severe protein-calorie malnutrition: Secondary | ICD-10-CM | POA: Diagnosis not present

## 2021-10-21 DIAGNOSIS — N183 Chronic kidney disease, stage 3 unspecified: Secondary | ICD-10-CM | POA: Diagnosis not present

## 2021-10-22 DIAGNOSIS — N183 Chronic kidney disease, stage 3 unspecified: Secondary | ICD-10-CM | POA: Diagnosis not present

## 2021-10-22 DIAGNOSIS — I519 Heart disease, unspecified: Secondary | ICD-10-CM | POA: Diagnosis not present

## 2021-10-22 DIAGNOSIS — I700 Atherosclerosis of aorta: Secondary | ICD-10-CM | POA: Diagnosis not present

## 2021-10-22 DIAGNOSIS — N400 Enlarged prostate without lower urinary tract symptoms: Secondary | ICD-10-CM | POA: Diagnosis not present

## 2021-10-22 DIAGNOSIS — M1990 Unspecified osteoarthritis, unspecified site: Secondary | ICD-10-CM | POA: Diagnosis not present

## 2021-10-22 DIAGNOSIS — E43 Unspecified severe protein-calorie malnutrition: Secondary | ICD-10-CM | POA: Diagnosis not present

## 2021-10-23 DIAGNOSIS — N183 Chronic kidney disease, stage 3 unspecified: Secondary | ICD-10-CM | POA: Diagnosis not present

## 2021-10-23 DIAGNOSIS — E43 Unspecified severe protein-calorie malnutrition: Secondary | ICD-10-CM | POA: Diagnosis not present

## 2021-10-23 DIAGNOSIS — E785 Hyperlipidemia, unspecified: Secondary | ICD-10-CM | POA: Diagnosis not present

## 2021-10-23 DIAGNOSIS — R42 Dizziness and giddiness: Secondary | ICD-10-CM | POA: Diagnosis not present

## 2021-10-23 DIAGNOSIS — E039 Hypothyroidism, unspecified: Secondary | ICD-10-CM | POA: Diagnosis not present

## 2021-10-23 DIAGNOSIS — K219 Gastro-esophageal reflux disease without esophagitis: Secondary | ICD-10-CM | POA: Diagnosis not present

## 2021-10-23 DIAGNOSIS — G4700 Insomnia, unspecified: Secondary | ICD-10-CM | POA: Diagnosis not present

## 2021-10-23 DIAGNOSIS — I519 Heart disease, unspecified: Secondary | ICD-10-CM | POA: Diagnosis not present

## 2021-10-23 DIAGNOSIS — N400 Enlarged prostate without lower urinary tract symptoms: Secondary | ICD-10-CM | POA: Diagnosis not present

## 2021-10-23 DIAGNOSIS — I700 Atherosclerosis of aorta: Secondary | ICD-10-CM | POA: Diagnosis not present

## 2021-10-23 DIAGNOSIS — M1990 Unspecified osteoarthritis, unspecified site: Secondary | ICD-10-CM | POA: Diagnosis not present

## 2021-10-23 DIAGNOSIS — R531 Weakness: Secondary | ICD-10-CM | POA: Diagnosis not present

## 2021-10-23 DIAGNOSIS — I429 Cardiomyopathy, unspecified: Secondary | ICD-10-CM | POA: Diagnosis not present

## 2021-10-24 DIAGNOSIS — N183 Chronic kidney disease, stage 3 unspecified: Secondary | ICD-10-CM | POA: Diagnosis not present

## 2021-10-24 DIAGNOSIS — E43 Unspecified severe protein-calorie malnutrition: Secondary | ICD-10-CM | POA: Diagnosis not present

## 2021-10-24 DIAGNOSIS — N400 Enlarged prostate without lower urinary tract symptoms: Secondary | ICD-10-CM | POA: Diagnosis not present

## 2021-10-24 DIAGNOSIS — I519 Heart disease, unspecified: Secondary | ICD-10-CM | POA: Diagnosis not present

## 2021-10-24 DIAGNOSIS — M1990 Unspecified osteoarthritis, unspecified site: Secondary | ICD-10-CM | POA: Diagnosis not present

## 2021-10-24 DIAGNOSIS — I700 Atherosclerosis of aorta: Secondary | ICD-10-CM | POA: Diagnosis not present

## 2021-10-25 ENCOUNTER — Other Ambulatory Visit: Payer: Self-pay | Admitting: Neurology

## 2021-10-26 DIAGNOSIS — I519 Heart disease, unspecified: Secondary | ICD-10-CM | POA: Diagnosis not present

## 2021-10-26 DIAGNOSIS — E43 Unspecified severe protein-calorie malnutrition: Secondary | ICD-10-CM | POA: Diagnosis not present

## 2021-10-26 DIAGNOSIS — M1990 Unspecified osteoarthritis, unspecified site: Secondary | ICD-10-CM | POA: Diagnosis not present

## 2021-10-26 DIAGNOSIS — N400 Enlarged prostate without lower urinary tract symptoms: Secondary | ICD-10-CM | POA: Diagnosis not present

## 2021-10-26 DIAGNOSIS — N183 Chronic kidney disease, stage 3 unspecified: Secondary | ICD-10-CM | POA: Diagnosis not present

## 2021-10-26 DIAGNOSIS — I700 Atherosclerosis of aorta: Secondary | ICD-10-CM | POA: Diagnosis not present

## 2021-10-27 DIAGNOSIS — N183 Chronic kidney disease, stage 3 unspecified: Secondary | ICD-10-CM | POA: Diagnosis not present

## 2021-10-27 DIAGNOSIS — M1990 Unspecified osteoarthritis, unspecified site: Secondary | ICD-10-CM | POA: Diagnosis not present

## 2021-10-27 DIAGNOSIS — N400 Enlarged prostate without lower urinary tract symptoms: Secondary | ICD-10-CM | POA: Diagnosis not present

## 2021-10-27 DIAGNOSIS — I700 Atherosclerosis of aorta: Secondary | ICD-10-CM | POA: Diagnosis not present

## 2021-10-27 DIAGNOSIS — I519 Heart disease, unspecified: Secondary | ICD-10-CM | POA: Diagnosis not present

## 2021-10-27 DIAGNOSIS — E43 Unspecified severe protein-calorie malnutrition: Secondary | ICD-10-CM | POA: Diagnosis not present

## 2021-10-28 DIAGNOSIS — I519 Heart disease, unspecified: Secondary | ICD-10-CM | POA: Diagnosis not present

## 2021-10-28 DIAGNOSIS — E43 Unspecified severe protein-calorie malnutrition: Secondary | ICD-10-CM | POA: Diagnosis not present

## 2021-10-28 DIAGNOSIS — M1990 Unspecified osteoarthritis, unspecified site: Secondary | ICD-10-CM | POA: Diagnosis not present

## 2021-10-28 DIAGNOSIS — N400 Enlarged prostate without lower urinary tract symptoms: Secondary | ICD-10-CM | POA: Diagnosis not present

## 2021-10-28 DIAGNOSIS — I700 Atherosclerosis of aorta: Secondary | ICD-10-CM | POA: Diagnosis not present

## 2021-10-28 DIAGNOSIS — N183 Chronic kidney disease, stage 3 unspecified: Secondary | ICD-10-CM | POA: Diagnosis not present

## 2021-10-29 DIAGNOSIS — N183 Chronic kidney disease, stage 3 unspecified: Secondary | ICD-10-CM | POA: Diagnosis not present

## 2021-10-29 DIAGNOSIS — E43 Unspecified severe protein-calorie malnutrition: Secondary | ICD-10-CM | POA: Diagnosis not present

## 2021-10-29 DIAGNOSIS — M1990 Unspecified osteoarthritis, unspecified site: Secondary | ICD-10-CM | POA: Diagnosis not present

## 2021-10-29 DIAGNOSIS — I700 Atherosclerosis of aorta: Secondary | ICD-10-CM | POA: Diagnosis not present

## 2021-10-29 DIAGNOSIS — I519 Heart disease, unspecified: Secondary | ICD-10-CM | POA: Diagnosis not present

## 2021-10-29 DIAGNOSIS — N400 Enlarged prostate without lower urinary tract symptoms: Secondary | ICD-10-CM | POA: Diagnosis not present

## 2021-10-30 DIAGNOSIS — I700 Atherosclerosis of aorta: Secondary | ICD-10-CM | POA: Diagnosis not present

## 2021-10-30 DIAGNOSIS — I519 Heart disease, unspecified: Secondary | ICD-10-CM | POA: Diagnosis not present

## 2021-10-30 DIAGNOSIS — N400 Enlarged prostate without lower urinary tract symptoms: Secondary | ICD-10-CM | POA: Diagnosis not present

## 2021-10-30 DIAGNOSIS — M1990 Unspecified osteoarthritis, unspecified site: Secondary | ICD-10-CM | POA: Diagnosis not present

## 2021-10-30 DIAGNOSIS — E43 Unspecified severe protein-calorie malnutrition: Secondary | ICD-10-CM | POA: Diagnosis not present

## 2021-10-30 DIAGNOSIS — N183 Chronic kidney disease, stage 3 unspecified: Secondary | ICD-10-CM | POA: Diagnosis not present

## 2021-11-20 DEATH — deceased

## 2022-03-31 IMAGING — CR DG KNEE COMPLETE 4+V*L*
4 series · 4 of 4 positions shown · non-contrast
Comparison: None.

CLINICAL DATA: Left knee weakness.

EXAM:
LEFT KNEE - COMPLETE 4+ VIEW

[knee ap]
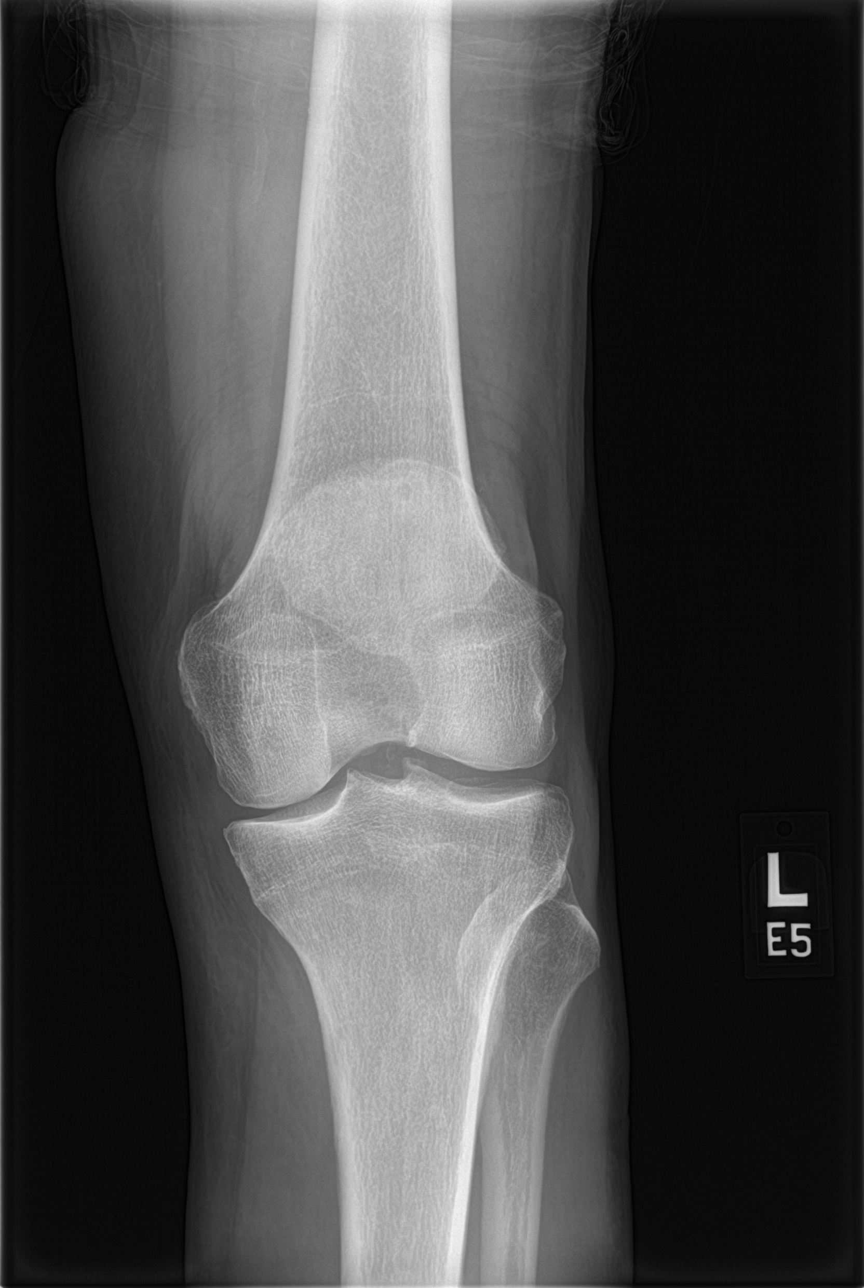

[knee obl (1 of 2)]
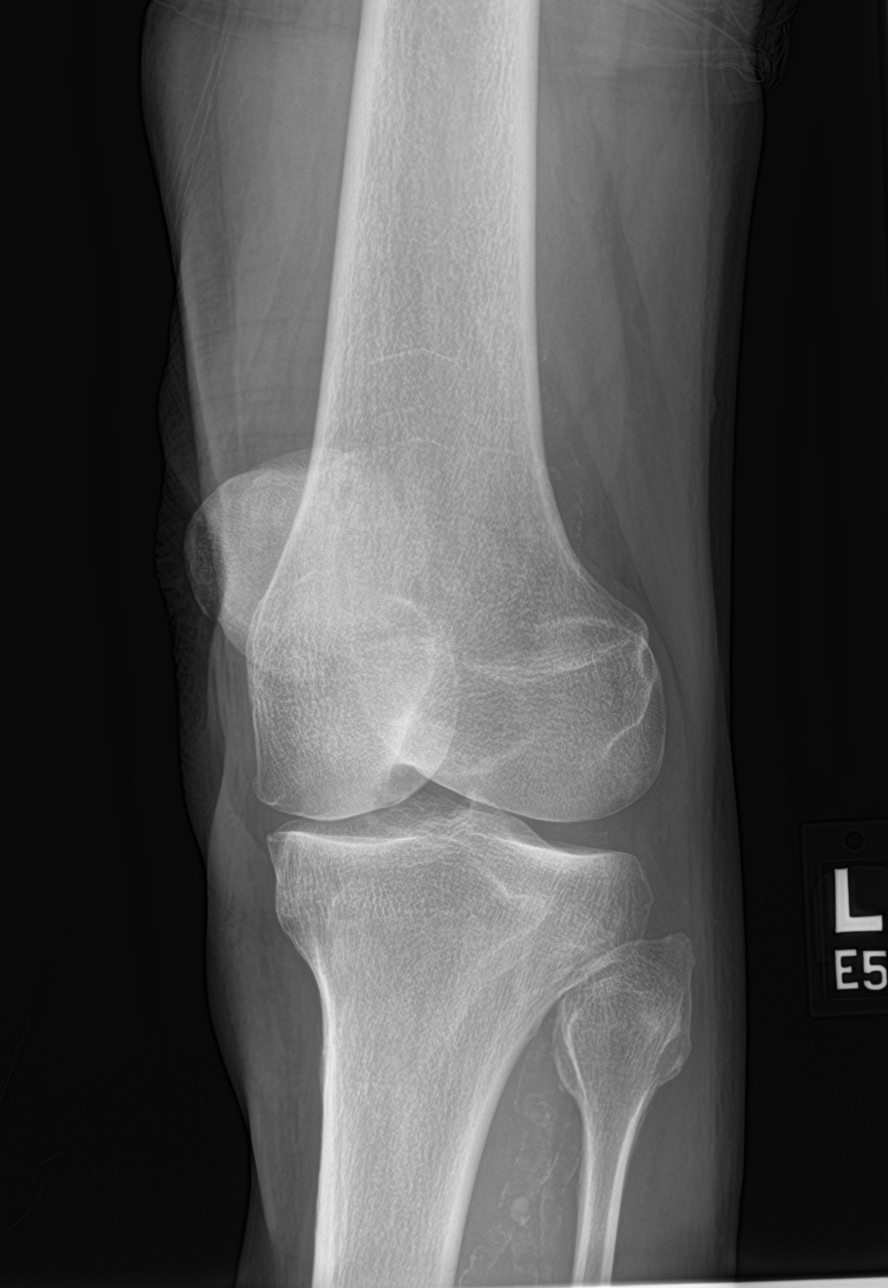

[knee obl (2 of 2)]
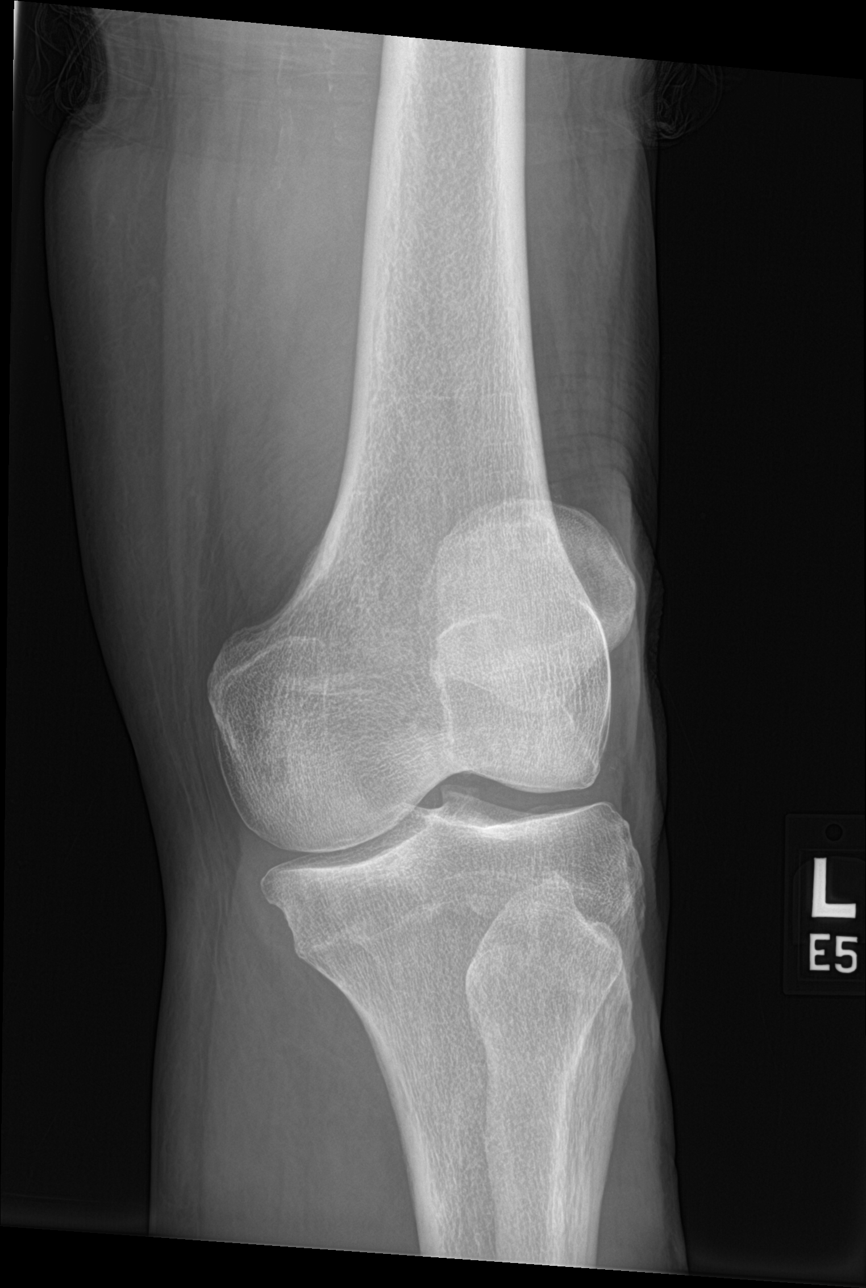

[knee lat]
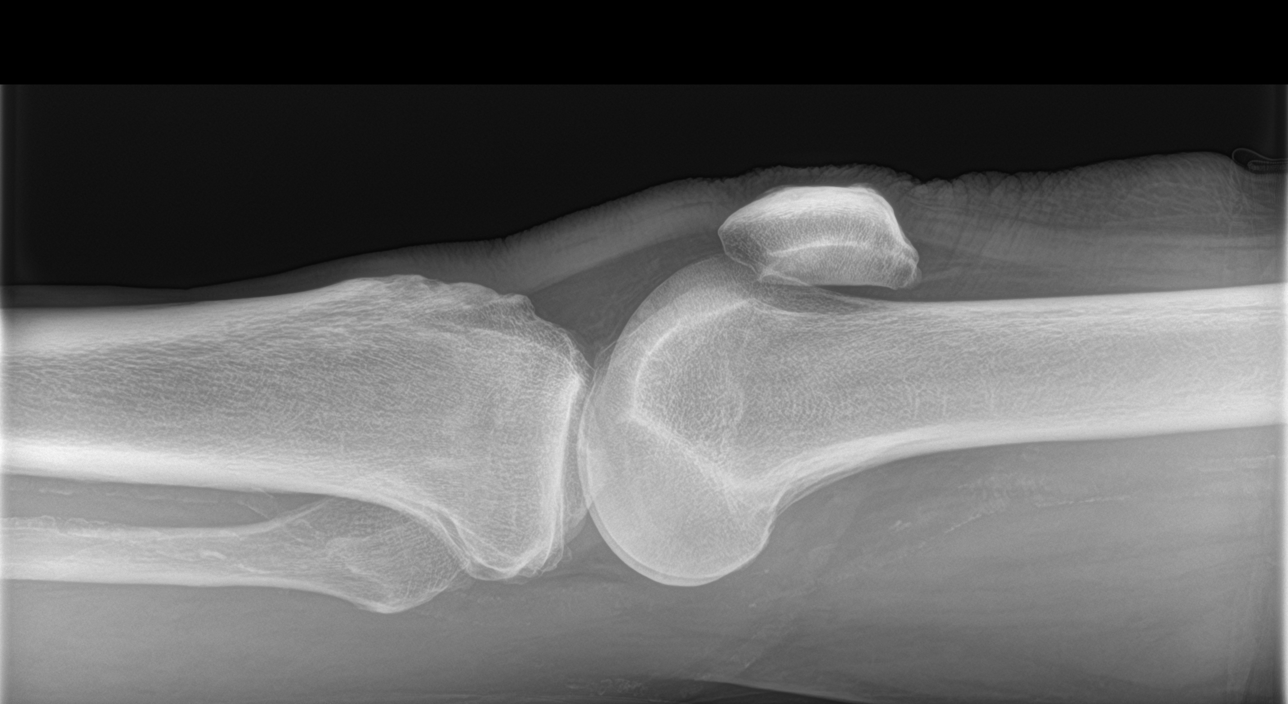

[4 of 4 positions shown; findings below may reference images not displayed]

FINDINGS: No fracture or dislocation. Left knee joint spaces appear preserved.
There is minimal spurring the tibial spines. No evidence of
chondrocalcinosis. No joint effusion. Regional soft tissues appear
normal.
IMPRESSION: No explanation for patient's left knee weakness.

## 2022-03-31 IMAGING — MR MR HEAD W/O CM
9 of 11 series · 35 of 48 positions shown · non-contrast
Comparison: MRI 06/08/2021.

CLINICAL DATA: Head trauma, minor (Age >= 65y)

EXAM:
MRI HEAD WITHOUT CONTRAST
TECHNIQUE: Multiplanar, multiecho pulse sequences of the brain and surrounding
structures were obtained without intravenous contrast.

[Series 4: DWI · axial · 3.0mm · 1.09mm/px · z∈[-2,+141]mm · 9 of 100 slices shown (1 of 4)]
[im 1/100]
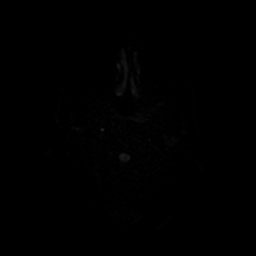
[im 13/100]
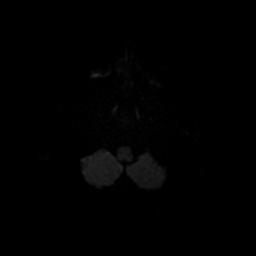
[im 25/100]
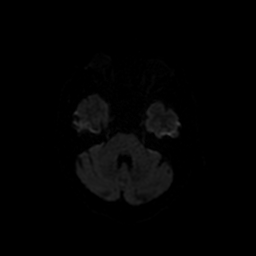
[im 38/100]
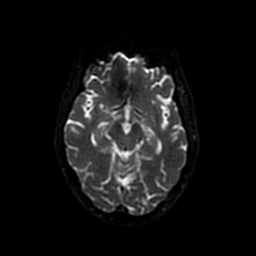
[im 50/100]
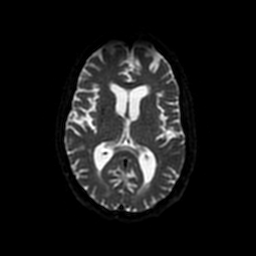
[im 62/100]
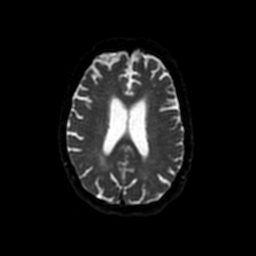
[im 75/100]
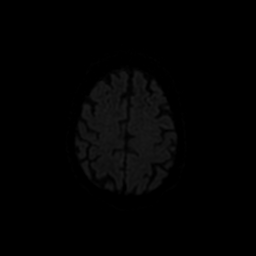
[im 87/100]
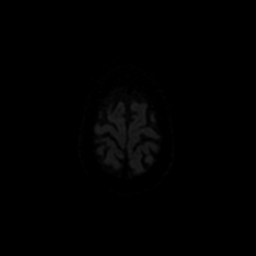
[im 100/100]
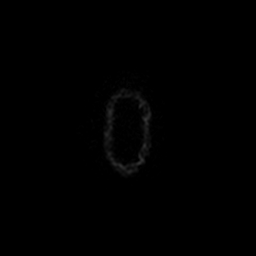

[Series 5: DWI · coronal · 5.0mm · 1.09mm/px · 7 of 78 slices shown (2 of 4)]
[im 1/78]
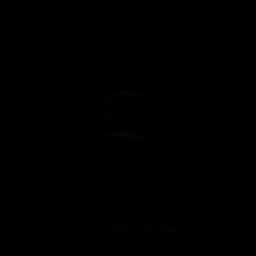
[im 13/78]
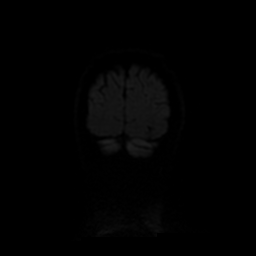
[im 26/78]
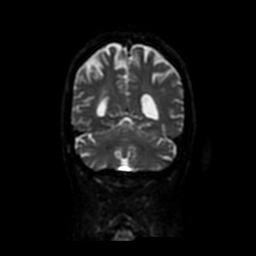
[im 39/78]
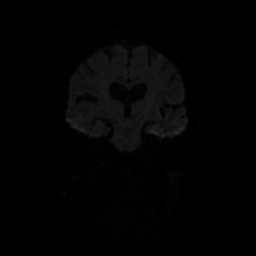
[im 52/78]
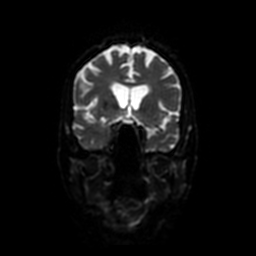
[im 65/78]
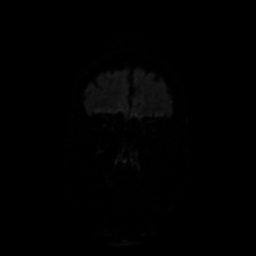
[im 78/78]
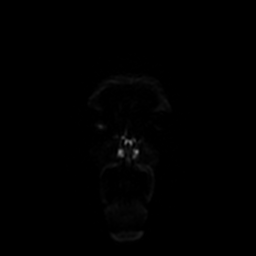

[Series 6: T1 · sagittal · 5.0mm · 0.47mm/px · 2 of 25 slices shown (1 of 2)]
[im 1/25]
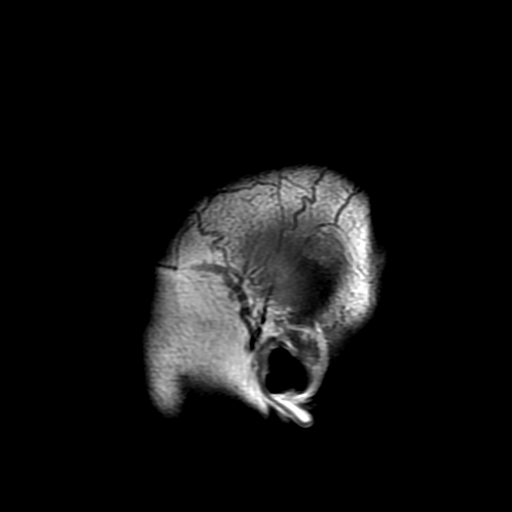
[im 25/25]
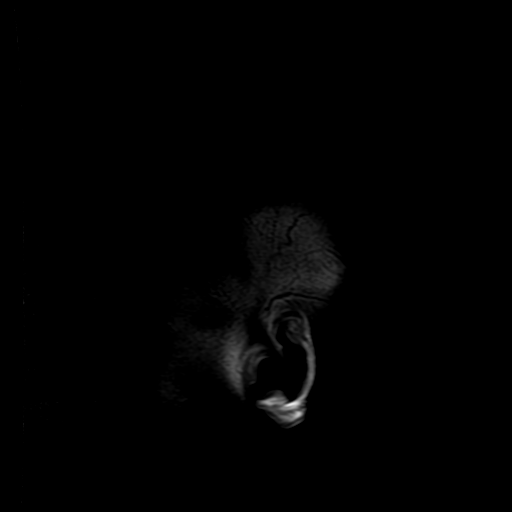

[Series 7: T2 · axial · 5.0mm · 0.43mm/px · z∈[-17,+125]mm · 2 of 25 slices shown (1 of 2)]
[im 1/25]
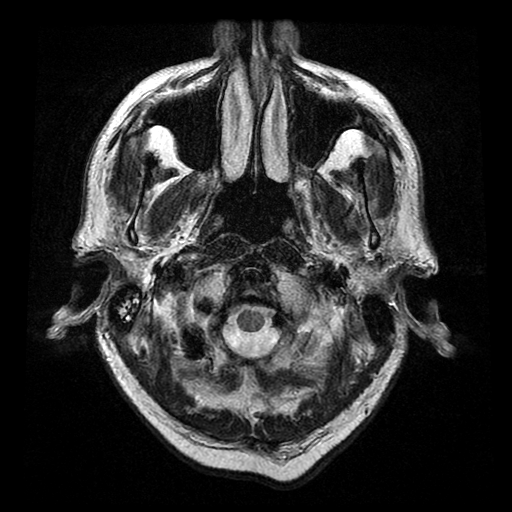
[im 25/25]
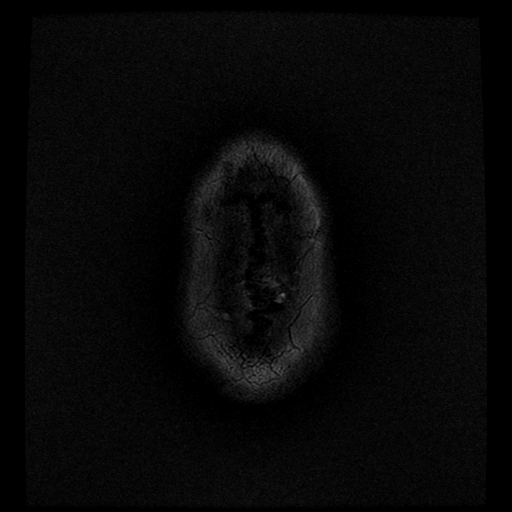

[Series 8: FLAIR · axial · 3.0mm · 0.43mm/px · z∈[-17,+125]mm · 2 of 25 slices shown]
[im 1/25]
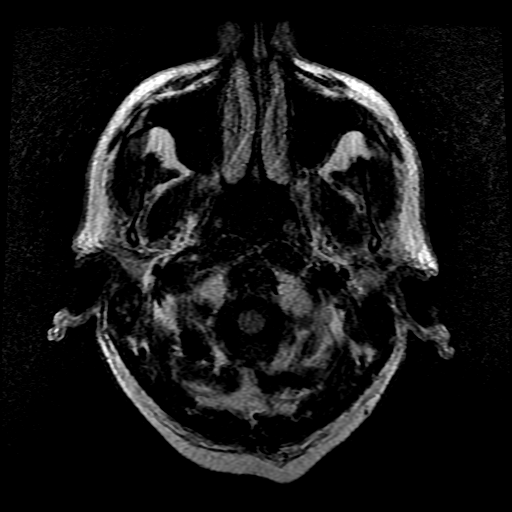
[im 25/25]
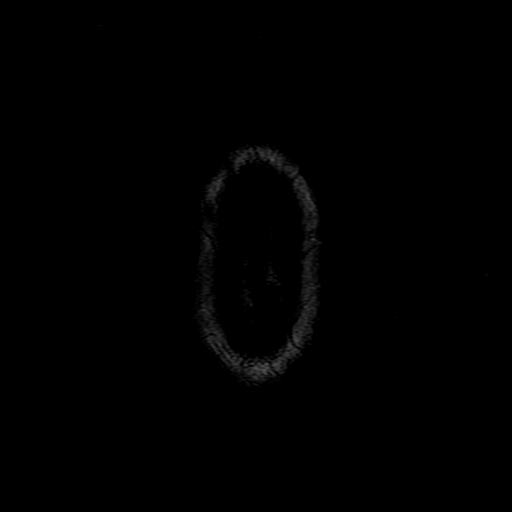

[Series 10: T1 · axial · 3.0mm · 0.47mm/px · z∈[-16,+19]mm · 3 of 100 slices shown (2 of 2)]
[im 1/100]
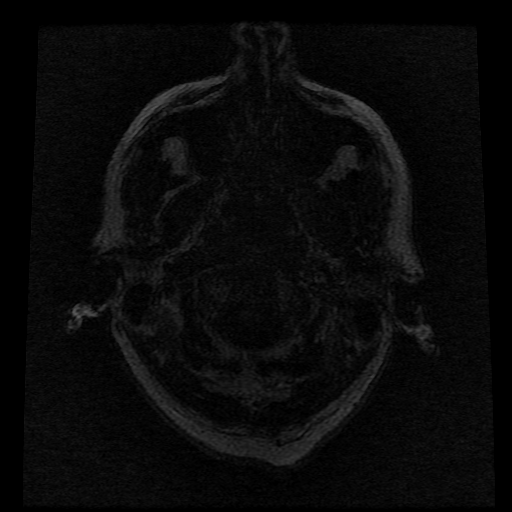
[im 13/100]
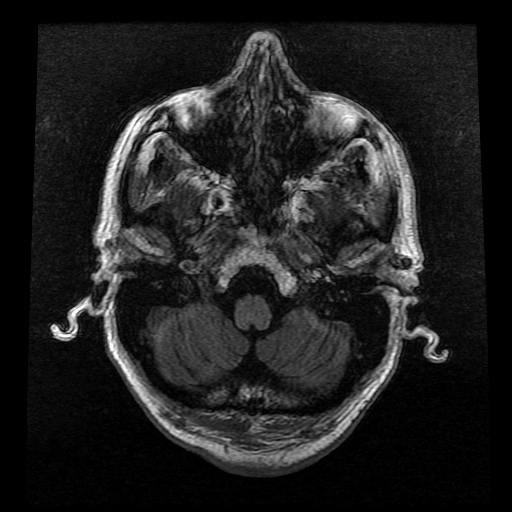
[im 25/100]
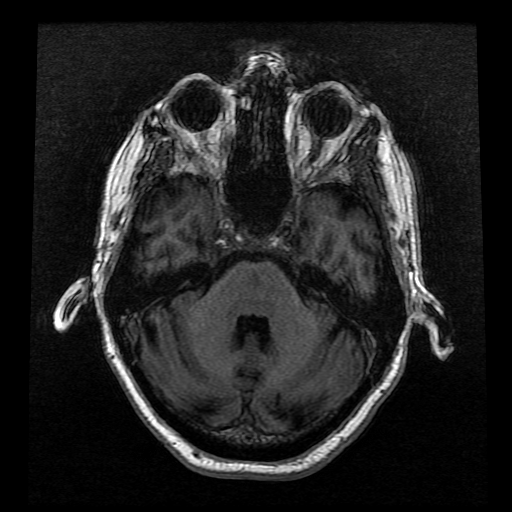

[Series 11: T2 · coronal · 5.0mm · 0.39mm/px · 3 of 30 slices shown (2 of 2)]
[im 1/30]
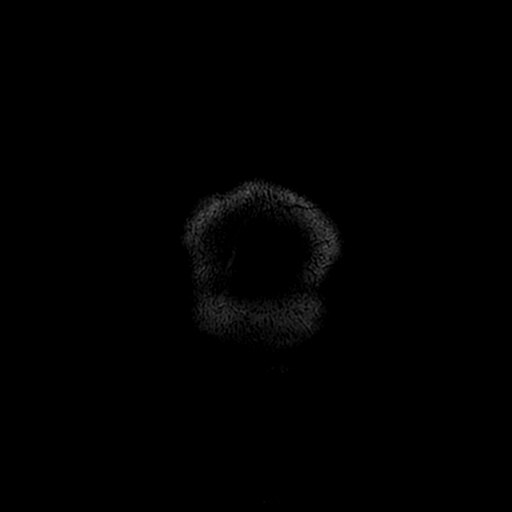
[im 15/30]
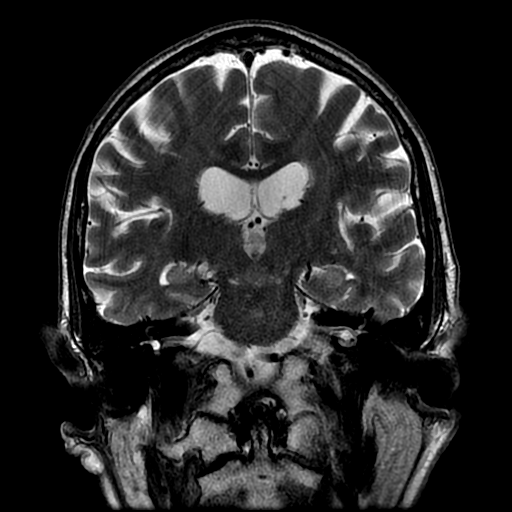
[im 30/30]
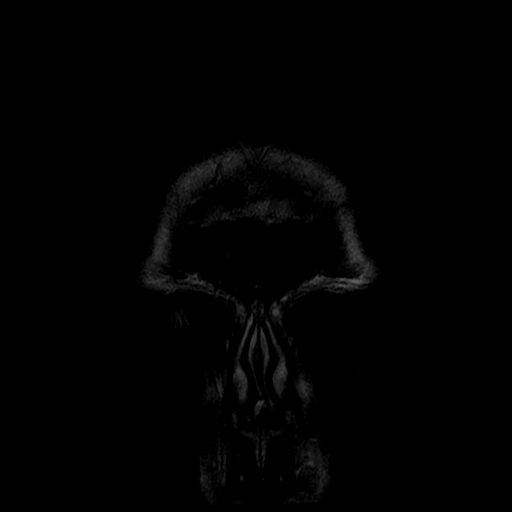

[Series 400: DWI · axial · 3.0mm · 1.09mm/px · z∈[-2,+141]mm · 4 of 50 slices shown (3 of 4)]
[im 1/50]
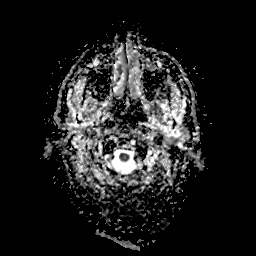
[im 17/50]
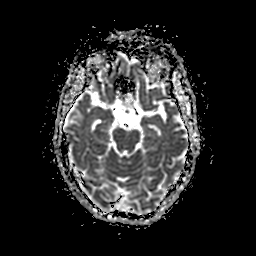
[im 33/50]
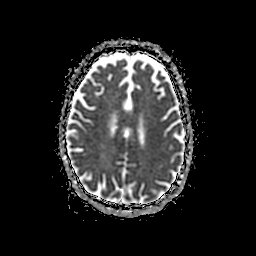
[im 50/50]
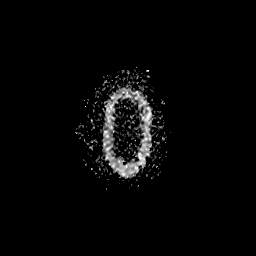

[Series 500: DWI · coronal · 5.0mm · 1.09mm/px · 3 of 39 slices shown (4 of 4)]
[im 1/39]
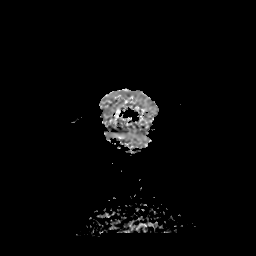
[im 20/39]
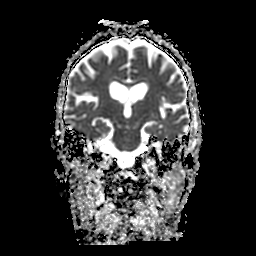
[im 39/39]
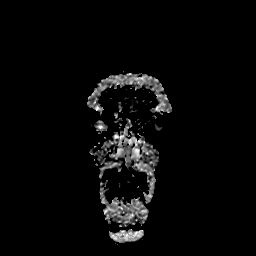

[35 of 48 positions shown; findings below may reference images not displayed]

FINDINGS: Brain: No acute infarction, hemorrhage, hydrocephalus, extra-axial
collection or mass lesion. Similar atrophy with ex vacuo ventricular
dilation. Similar scattered T2 hyperintensities in the white matter,
compatible with chronic microvascular ischemic disease. Similar
small remote lacunar infarcts in the left cerebellum and left
occipital lobe.

Vascular: Unchanged absence of the right ICA flow void with
reconstitution of the MCA and ACA flow voids.

Skull and upper cervical spine: Normal marrow signal.

Sinuses/Orbits: Clear sinuses.  No acute orbital findings.

Other: Chronic right mastoid effusion.
IMPRESSION: 1. No evidence of acute intracranial abnormality.
2. Similar atrophy and chronic microvascular ischemic disease.
3. Similar absence of the right ICA flow void reconstitution of the
ACA and MCA, compatible with right carotid occlusion.

## 2022-04-03 IMAGING — DX DG CHEST 1V PORT
1 series · 1 of 1 positions shown · non-contrast
Comparison: 06/08/2021

CLINICAL DATA: Shortness of breath

EXAM:
PORTABLE CHEST 1 VIEW

[chest ap]
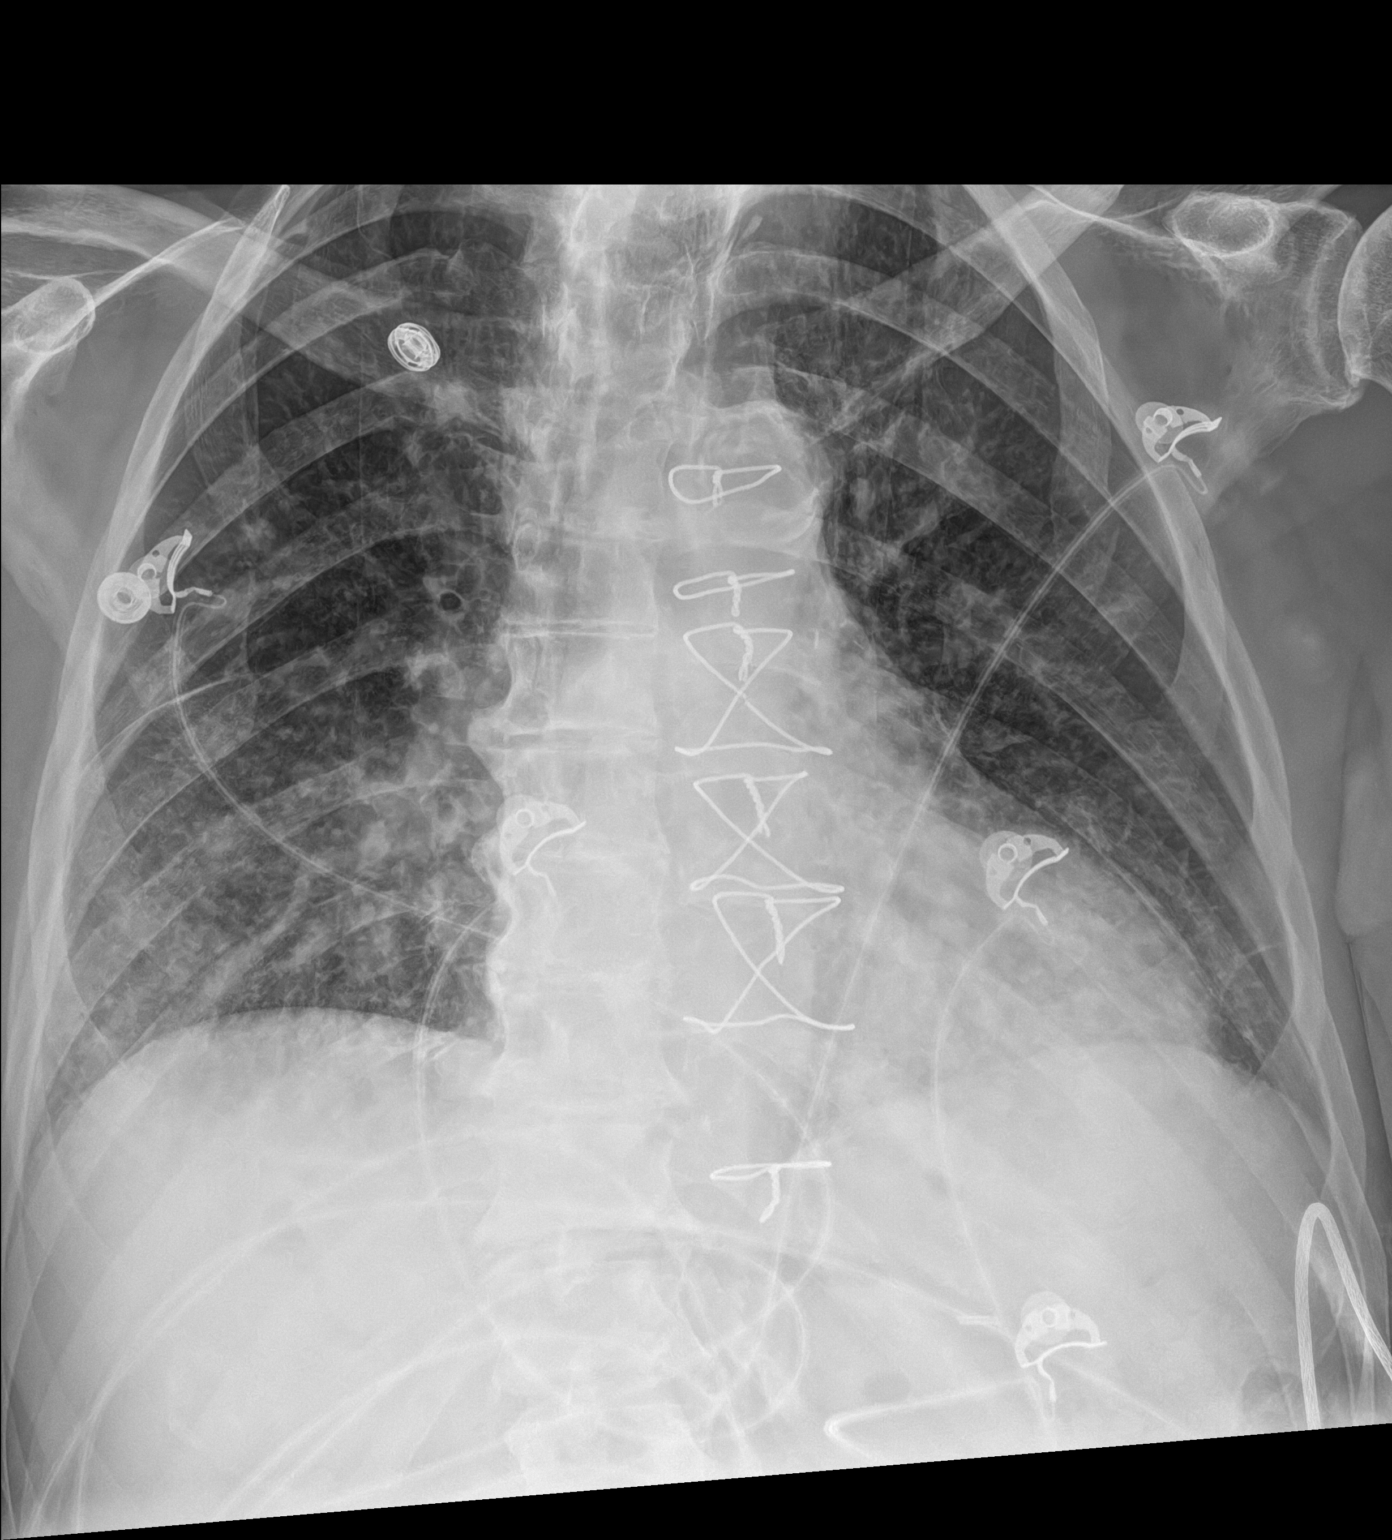

[1 of 1 positions shown; findings below may reference images not displayed]

FINDINGS: Post sternotomy changes. Borderline cardiomegaly with mild central
congestion. Patchy airspace opacities at the bases. Aortic
atherosclerosis. No pneumothorax.
IMPRESSION: 1. Borderline cardiomegaly with vascular congestion.
2. Patchy left greater than right basilar airspace disease, possible
pneumonia

## 2022-05-09 ENCOUNTER — Other Ambulatory Visit: Payer: Medicare Other

## 2022-05-16 ENCOUNTER — Ambulatory Visit: Payer: Medicare Other | Admitting: Hematology and Oncology

## 2022-10-28 ENCOUNTER — Other Ambulatory Visit (HOSPITAL_COMMUNITY): Payer: Self-pay
